# Patient Record
Sex: Female | Born: 1955
Health system: Southern US, Community
[De-identification: ages and names within clinical notes are randomized; demographics above are authoritative.]

## PROBLEM LIST (undated history)

## (undated) DIAGNOSIS — I255 Ischemic cardiomyopathy: Secondary | ICD-10-CM

## (undated) DIAGNOSIS — T8859XA Other complications of anesthesia, initial encounter: Secondary | ICD-10-CM

## (undated) DIAGNOSIS — I219 Acute myocardial infarction, unspecified: Secondary | ICD-10-CM

## (undated) DIAGNOSIS — E7800 Pure hypercholesterolemia, unspecified: Secondary | ICD-10-CM

## (undated) DIAGNOSIS — N952 Postmenopausal atrophic vaginitis: Secondary | ICD-10-CM

## (undated) DIAGNOSIS — I739 Peripheral vascular disease, unspecified: Secondary | ICD-10-CM

## (undated) DIAGNOSIS — N189 Chronic kidney disease, unspecified: Secondary | ICD-10-CM

## (undated) DIAGNOSIS — I10 Essential (primary) hypertension: Secondary | ICD-10-CM

## (undated) DIAGNOSIS — I639 Cerebral infarction, unspecified: Secondary | ICD-10-CM

## (undated) DIAGNOSIS — R55 Syncope and collapse: Secondary | ICD-10-CM

## (undated) DIAGNOSIS — F419 Anxiety disorder, unspecified: Secondary | ICD-10-CM

## (undated) DIAGNOSIS — G4700 Insomnia, unspecified: Secondary | ICD-10-CM

## (undated) DIAGNOSIS — I2510 Atherosclerotic heart disease of native coronary artery without angina pectoris: Secondary | ICD-10-CM

## (undated) DIAGNOSIS — R112 Nausea with vomiting, unspecified: Secondary | ICD-10-CM

## (undated) DIAGNOSIS — M1990 Unspecified osteoarthritis, unspecified site: Secondary | ICD-10-CM

## (undated) DIAGNOSIS — I779 Disorder of arteries and arterioles, unspecified: Secondary | ICD-10-CM

## (undated) DIAGNOSIS — F32A Depression, unspecified: Secondary | ICD-10-CM

## (undated) DIAGNOSIS — K219 Gastro-esophageal reflux disease without esophagitis: Secondary | ICD-10-CM

## (undated) DIAGNOSIS — T4145XA Adverse effect of unspecified anesthetic, initial encounter: Secondary | ICD-10-CM

## (undated) DIAGNOSIS — Z87440 Personal history of urinary (tract) infections: Secondary | ICD-10-CM

## (undated) DIAGNOSIS — F329 Major depressive disorder, single episode, unspecified: Secondary | ICD-10-CM

## (undated) DIAGNOSIS — G43909 Migraine, unspecified, not intractable, without status migrainosus: Secondary | ICD-10-CM

## (undated) DIAGNOSIS — R6882 Decreased libido: Secondary | ICD-10-CM

## (undated) DIAGNOSIS — Z9889 Other specified postprocedural states: Secondary | ICD-10-CM

## (undated) DIAGNOSIS — Z780 Asymptomatic menopausal state: Secondary | ICD-10-CM

## (undated) DIAGNOSIS — Z720 Tobacco use: Secondary | ICD-10-CM

## (undated) DIAGNOSIS — R1031 Right lower quadrant pain: Secondary | ICD-10-CM

## (undated) DIAGNOSIS — E785 Hyperlipidemia, unspecified: Secondary | ICD-10-CM

## (undated) HISTORY — DX: Depression, unspecified: F32.A

## (undated) HISTORY — DX: Right lower quadrant pain: R10.31

## (undated) HISTORY — DX: Cerebral infarction, unspecified: I63.9

## (undated) HISTORY — PX: TUBAL LIGATION: SHX77

## (undated) HISTORY — DX: Disorder of arteries and arterioles, unspecified: I77.9

## (undated) HISTORY — DX: Atherosclerotic heart disease of native coronary artery without angina pectoris: I25.10

## (undated) HISTORY — DX: Asymptomatic menopausal state: Z78.0

## (undated) HISTORY — DX: Anxiety disorder, unspecified: F41.9

## (undated) HISTORY — DX: Insomnia, unspecified: G47.00

## (undated) HISTORY — DX: Acute myocardial infarction, unspecified: I21.9

## (undated) HISTORY — DX: Hyperlipidemia, unspecified: E78.5

## (undated) HISTORY — DX: Decreased libido: R68.82

## (undated) HISTORY — DX: Ischemic cardiomyopathy: I25.5

## (undated) HISTORY — DX: Personal history of urinary (tract) infections: Z87.440

## (undated) HISTORY — DX: Syncope and collapse: R55

## (undated) HISTORY — PX: OOPHORECTOMY: SHX86

## (undated) HISTORY — PX: ENDOMETRIAL ABLATION: SHX621

## (undated) HISTORY — DX: Peripheral vascular disease, unspecified: I73.9

## (undated) HISTORY — DX: Major depressive disorder, single episode, unspecified: F32.9

## (undated) HISTORY — DX: Tobacco use: Z72.0

## (undated) HISTORY — DX: Postmenopausal atrophic vaginitis: N95.2

## (undated) HISTORY — DX: Migraine, unspecified, not intractable, without status migrainosus: G43.909

## (undated) SURGERY — ECHOCARDIOGRAM, TRANSESOPHAGEAL
Anesthesia: Moderate Sedation

---

## 2005-08-28 ENCOUNTER — Ambulatory Visit: Payer: Self-pay | Admitting: Vascular Surgery

## 2005-09-08 ENCOUNTER — Ambulatory Visit: Payer: Self-pay | Admitting: Vascular Surgery

## 2006-10-20 ENCOUNTER — Ambulatory Visit: Payer: Self-pay | Admitting: Orthopaedic Surgery

## 2006-11-27 HISTORY — PX: KNEE ARTHROSCOPY: SUR90

## 2006-12-28 ENCOUNTER — Ambulatory Visit: Payer: Self-pay | Admitting: Orthopaedic Surgery

## 2010-09-18 ENCOUNTER — Ambulatory Visit: Payer: Self-pay | Admitting: Unknown Physician Specialty

## 2010-11-09 LAB — HM COLONOSCOPY

## 2010-12-04 ENCOUNTER — Ambulatory Visit: Payer: Self-pay | Admitting: Unknown Physician Specialty

## 2010-12-10 ENCOUNTER — Ambulatory Visit: Payer: Self-pay | Admitting: Unknown Physician Specialty

## 2010-12-15 LAB — PATHOLOGY REPORT

## 2011-07-02 ENCOUNTER — Observation Stay: Payer: Self-pay | Admitting: Internal Medicine

## 2012-04-26 ENCOUNTER — Ambulatory Visit: Payer: Self-pay | Admitting: Internal Medicine

## 2013-03-15 ENCOUNTER — Inpatient Hospital Stay: Payer: Self-pay | Admitting: Internal Medicine

## 2013-03-15 LAB — BASIC METABOLIC PANEL
Anion Gap: 10 (ref 7–16)
Calcium, Total: 9.3 mg/dL (ref 8.5–10.1)
Chloride: 107 mmol/L (ref 98–107)
EGFR (African American): >60
Osmolality: 281 (ref 275–301)
Potassium: 3.6 mmol/L (ref 3.5–5.1)
Sodium: 142 mmol/L (ref 136–145)

## 2013-03-15 LAB — CK TOTAL AND CKMB (NOT AT ARMC)
CK, Total: 111 U/L (ref 21–215)
CK-MB: 1.2 ng/mL (ref 0.5–3.6)

## 2013-03-15 LAB — APTT: Activated PTT: 29.1 secs (ref 23.6–35.9)

## 2013-03-15 LAB — CBC
HCT: 36.2 % (ref 35.0–47.0)
HGB: 12.5 g/dL (ref 12.0–16.0)
MCH: 32.2 pg (ref 26.0–34.0)
MCHC: 34.4 g/dL (ref 32.0–36.0)
Platelet: 226 10*3/uL (ref 150–440)
WBC: 7 10*3/uL (ref 3.6–11.0)

## 2013-03-15 LAB — TROPONIN I: Troponin-I: 0.36 ng/mL — ABNORMAL HIGH

## 2013-03-16 HISTORY — PX: CARDIAC CATHETERIZATION: SHX172

## 2013-03-16 LAB — CBC WITH DIFFERENTIAL/PLATELET
Basophil #: 0 10*3/uL (ref 0.0–0.1)
Basophil %: 0.6 %
Eosinophil #: 0.3 10*3/uL (ref 0.0–0.7)
HCT: 34.3 % — ABNORMAL LOW (ref 35.0–47.0)
HGB: 11.8 g/dL — ABNORMAL LOW (ref 12.0–16.0)
Lymphocyte #: 2.1 10*3/uL (ref 1.0–3.6)
MCH: 31.9 pg (ref 26.0–34.0)
MCHC: 34.3 g/dL (ref 32.0–36.0)
Monocyte #: 0.8 x10 3/mm (ref 0.2–0.9)
Monocyte %: 11.2 %
Neutrophil %: 53.6 %
Platelet: 206 10*3/uL (ref 150–440)
RBC: 3.69 10*6/uL — ABNORMAL LOW (ref 3.80–5.20)

## 2013-03-16 LAB — BASIC METABOLIC PANEL
BUN: 13 mg/dL (ref 7–18)
Calcium, Total: 8.5 mg/dL (ref 8.5–10.1)
Co2: 28 mmol/L (ref 21–32)
Creatinine: 0.72 mg/dL (ref 0.60–1.30)
EGFR (Non-African Amer.): >60
Osmolality: 289 (ref 275–301)

## 2013-03-16 LAB — LIPID PANEL: Ldl Cholesterol, Calc: 134 mg/dL — ABNORMAL HIGH (ref 0–100)

## 2013-03-16 LAB — CK TOTAL AND CKMB (NOT AT ARMC)
CK-MB: 0.7 ng/mL (ref 0.5–3.6)
CK-MB: 0.7 ng/mL (ref 0.5–3.6)

## 2013-03-16 LAB — TROPONIN I: Troponin-I: 0.28 ng/mL — ABNORMAL HIGH

## 2013-03-16 LAB — PROTIME-INR: INR: 1

## 2013-03-17 LAB — CBC WITH DIFFERENTIAL/PLATELET
Eosinophil #: 0.2 10*3/uL (ref 0.0–0.7)
Eosinophil %: 2.8 %
HGB: 11.6 g/dL — ABNORMAL LOW (ref 12.0–16.0)
Lymphocyte #: 1.6 10*3/uL (ref 1.0–3.6)
Lymphocyte %: 24.2 %
MCH: 32.2 pg (ref 26.0–34.0)
Monocyte #: 0.7 x10 3/mm (ref 0.2–0.9)
Monocyte %: 10.7 %
Neutrophil #: 4.1 10*3/uL (ref 1.4–6.5)
Neutrophil %: 61.8 %
Platelet: 184 10*3/uL (ref 150–440)
RDW: 12.5 % (ref 11.5–14.5)

## 2013-03-17 LAB — BASIC METABOLIC PANEL
Anion Gap: 6 — ABNORMAL LOW (ref 7–16)
Creatinine: 0.67 mg/dL (ref 0.60–1.30)
EGFR (African American): >60
EGFR (Non-African Amer.): >60
Glucose: 81 mg/dL (ref 65–99)
Osmolality: 285 (ref 275–301)
Potassium: 3.7 mmol/L (ref 3.5–5.1)
Sodium: 144 mmol/L (ref 136–145)

## 2013-03-17 LAB — TROPONIN I: Troponin-I: 0.11 ng/mL — ABNORMAL HIGH

## 2013-03-17 LAB — CK TOTAL AND CKMB (NOT AT ARMC): CK-MB: 0.6 ng/mL (ref 0.5–3.6)

## 2013-03-31 ENCOUNTER — Encounter: Payer: Self-pay | Admitting: Cardiology

## 2013-04-04 ENCOUNTER — Emergency Department: Payer: Self-pay | Admitting: Internal Medicine

## 2013-04-04 LAB — CBC
HCT: 41.4 % (ref 35.0–47.0)
HGB: 14.3 g/dL (ref 12.0–16.0)
MCH: 32 pg (ref 26.0–34.0)
Platelet: 204 10*3/uL (ref 150–440)
RDW: 12.2 % (ref 11.5–14.5)
WBC: 7.1 10*3/uL (ref 3.6–11.0)

## 2013-04-04 LAB — BASIC METABOLIC PANEL
Calcium, Total: 9.8 mg/dL (ref 8.5–10.1)
Co2: 28 mmol/L (ref 21–32)
Creatinine: 1.05 mg/dL (ref 0.60–1.30)
EGFR (African American): >60
Sodium: 136 mmol/L (ref 136–145)

## 2013-04-04 LAB — PROTIME-INR: Prothrombin Time: 13.3 secs (ref 11.5–14.7)

## 2013-04-04 LAB — TROPONIN I
Troponin-I: <0.02 ng/mL
Troponin-I: <0.02 ng/mL

## 2013-04-09 ENCOUNTER — Encounter: Payer: Self-pay | Admitting: Cardiology

## 2013-05-09 ENCOUNTER — Encounter: Payer: Self-pay | Admitting: Cardiology

## 2013-06-09 ENCOUNTER — Encounter: Payer: Self-pay | Admitting: Cardiology

## 2013-08-10 ENCOUNTER — Inpatient Hospital Stay (HOSPITAL_COMMUNITY)
Admission: EM | Admit: 2013-08-10 | Discharge: 2013-08-11 | DRG: 533 | Disposition: A | Discharge status: Home / Self Care | Payer: BC Managed Care – PPO | Attending: Family Medicine | Admitting: Family Medicine

## 2013-08-10 ENCOUNTER — Emergency Department (HOSPITAL_COMMUNITY): Payer: BC Managed Care – PPO

## 2013-08-10 ENCOUNTER — Ambulatory Visit (INDEPENDENT_AMBULATORY_CARE_PROVIDER_SITE_OTHER): Payer: BC Managed Care – PPO | Admitting: Family Medicine

## 2013-08-10 ENCOUNTER — Encounter (HOSPITAL_COMMUNITY): Payer: Self-pay | Admitting: Neurology

## 2013-08-10 VITALS — BP 130/84 | HR 57 | Temp 98.5°F | Resp 16 | Ht 62.0 in | Wt 135.0 lb

## 2013-08-10 DIAGNOSIS — I509 Heart failure, unspecified: Secondary | ICD-10-CM | POA: Diagnosis present

## 2013-08-10 DIAGNOSIS — Z87891 Personal history of nicotine dependence: Secondary | ICD-10-CM

## 2013-08-10 DIAGNOSIS — R29898 Other symptoms and signs involving the musculoskeletal system: Secondary | ICD-10-CM | POA: Diagnosis present

## 2013-08-10 DIAGNOSIS — I252 Old myocardial infarction: Secondary | ICD-10-CM

## 2013-08-10 DIAGNOSIS — E782 Mixed hyperlipidemia: Secondary | ICD-10-CM | POA: Diagnosis present

## 2013-08-10 DIAGNOSIS — E876 Hypokalemia: Secondary | ICD-10-CM | POA: Diagnosis present

## 2013-08-10 DIAGNOSIS — R531 Weakness: Secondary | ICD-10-CM | POA: Diagnosis present

## 2013-08-10 DIAGNOSIS — I6340 Cerebral infarction due to embolism of unspecified cerebral artery: Principal | ICD-10-CM | POA: Diagnosis present

## 2013-08-10 DIAGNOSIS — I10 Essential (primary) hypertension: Secondary | ICD-10-CM | POA: Diagnosis present

## 2013-08-10 DIAGNOSIS — I639 Cerebral infarction, unspecified: Secondary | ICD-10-CM | POA: Diagnosis present

## 2013-08-10 DIAGNOSIS — R269 Unspecified abnormalities of gait and mobility: Secondary | ICD-10-CM | POA: Diagnosis present

## 2013-08-10 DIAGNOSIS — E7800 Pure hypercholesterolemia, unspecified: Secondary | ICD-10-CM | POA: Diagnosis present

## 2013-08-10 DIAGNOSIS — I5020 Unspecified systolic (congestive) heart failure: Secondary | ICD-10-CM | POA: Diagnosis present

## 2013-08-10 DIAGNOSIS — R42 Dizziness and giddiness: Secondary | ICD-10-CM

## 2013-08-10 DIAGNOSIS — H5789 Other specified disorders of eye and adnexa: Secondary | ICD-10-CM

## 2013-08-10 DIAGNOSIS — R002 Palpitations: Secondary | ICD-10-CM

## 2013-08-10 DIAGNOSIS — R070 Pain in throat: Secondary | ICD-10-CM

## 2013-08-10 DIAGNOSIS — Z823 Family history of stroke: Secondary | ICD-10-CM

## 2013-08-10 DIAGNOSIS — R0602 Shortness of breath: Secondary | ICD-10-CM

## 2013-08-10 DIAGNOSIS — E785 Hyperlipidemia, unspecified: Secondary | ICD-10-CM

## 2013-08-10 HISTORY — DX: Essential (primary) hypertension: I10

## 2013-08-10 HISTORY — DX: Pure hypercholesterolemia, unspecified: E78.00

## 2013-08-10 LAB — COMPREHENSIVE METABOLIC PANEL
BUN: 21 mg/dL (ref 6–23)
CO2: 24 mEq/L (ref 19–32)
Calcium: 9.3 mg/dL (ref 8.4–10.5)
Chloride: 97 mEq/L (ref 96–112)
Creatinine, Ser: 0.96 mg/dL (ref 0.50–1.10)
GFR calc Af Amer: 75 mL/min — ABNORMAL LOW (ref >90)
GFR calc non Af Amer: 65 mL/min — ABNORMAL LOW (ref >90)
Glucose, Bld: 82 mg/dL (ref 70–99)
Sodium: 135 mEq/L (ref 135–145)
Total Bilirubin: 0.3 mg/dL (ref 0.3–1.2)

## 2013-08-10 LAB — URINALYSIS, ROUTINE W REFLEX MICROSCOPIC
Bilirubin Urine: NEGATIVE
Glucose, UA: NEGATIVE mg/dL
Hgb urine dipstick: NEGATIVE
Nitrite: NEGATIVE
Specific Gravity, Urine: 1.013 (ref 1.005–1.030)
Urobilinogen, UA: 0.2 mg/dL (ref 0.0–1.0)
pH: 6 (ref 5.0–8.0)

## 2013-08-10 LAB — RAPID URINE DRUG SCREEN, HOSP PERFORMED
Barbiturates: NOT DETECTED
Cocaine: NOT DETECTED
Opiates: NOT DETECTED
Tetrahydrocannabinol: NOT DETECTED

## 2013-08-10 LAB — POCT I-STAT, CHEM 8
BUN: 22 mg/dL (ref 6–23)
Creatinine, Ser: 1.1 mg/dL (ref 0.50–1.10)
Glucose, Bld: 88 mg/dL (ref 70–99)
Potassium: 2.8 mEq/L — ABNORMAL LOW (ref 3.5–5.1)
Sodium: 139 mEq/L (ref 135–145)
TCO2: 27 mmol/L (ref 0–100)

## 2013-08-10 LAB — GLUCOSE, CAPILLARY: Glucose-Capillary: 89 mg/dL (ref 70–99)

## 2013-08-10 LAB — TROPONIN I
Troponin I: <0.3 ng/mL (ref <0.30)
Troponin I: <0.3 ng/mL (ref <0.30)
Troponin I: <0.3 ng/mL (ref <0.30)

## 2013-08-10 LAB — DIFFERENTIAL
Eosinophils Relative: 0 % (ref 0–5)
Lymphocytes Relative: 20 % (ref 12–46)
Lymphs Abs: 2 10*3/uL (ref 0.7–4.0)
Monocytes Absolute: 1.1 10*3/uL — ABNORMAL HIGH (ref 0.1–1.0)
Monocytes Relative: 11 % (ref 3–12)
Neutro Abs: 7.2 10*3/uL (ref 1.7–7.7)
Neutrophils Relative %: 69 % (ref 43–77)

## 2013-08-10 LAB — CBC
HCT: 33.9 % — ABNORMAL LOW (ref 36.0–46.0)
Hemoglobin: 11.9 g/dL — ABNORMAL LOW (ref 12.0–15.0)
MCV: 92.9 fL (ref 78.0–100.0)
Platelets: 249 10*3/uL (ref 150–400)
RBC: 3.65 MIL/uL — ABNORMAL LOW (ref 3.87–5.11)
RDW: 12.7 % (ref 11.5–15.5)
WBC: 10.4 10*3/uL (ref 4.0–10.5)

## 2013-08-10 LAB — POCT I-STAT TROPONIN I: Troponin i, poc: 0 ng/mL (ref 0.00–0.08)

## 2013-08-10 LAB — ETHANOL: Alcohol, Ethyl (B): <11 mg/dL (ref 0–11)

## 2013-08-10 MED ORDER — ASPIRIN EC 325 MG PO TBEC
325.0000 mg | DELAYED_RELEASE_TABLET | Freq: Every day | ORAL | Status: DC
  Administered 2013-08-11: 325 mg via ORAL
  Filled 2013-08-10: qty 1

## 2013-08-10 MED ORDER — POTASSIUM CHLORIDE 10 MEQ/100ML IV SOLN
10.0000 meq | INTRAVENOUS | Status: AC
  Administered 2013-08-10 (×2): 10 meq via INTRAVENOUS
  Filled 2013-08-10 (×4): qty 100

## 2013-08-10 MED ORDER — ZOLPIDEM TARTRATE 5 MG PO TABS: 5.0000 mg | ORAL_TABLET | Freq: Every evening | ORAL | Status: DC | PRN

## 2013-08-10 MED ORDER — POTASSIUM CHLORIDE CRYS ER 20 MEQ PO TBCR
40.0000 meq | EXTENDED_RELEASE_TABLET | Freq: Two times a day (BID) | ORAL | Status: DC
  Administered 2013-08-10: 40 meq via ORAL
  Filled 2013-08-10: qty 2

## 2013-08-10 MED ORDER — INFLUENZA VAC SPLIT QUAD 0.5 ML IM SUSP
0.5000 mL | INTRAMUSCULAR | Status: AC
  Administered 2013-08-11: 0.5 mL via INTRAMUSCULAR
  Filled 2013-08-10 (×2): qty 0.5

## 2013-08-10 MED ORDER — ATORVASTATIN CALCIUM 40 MG PO TABS
40.0000 mg | ORAL_TABLET | Freq: Every day | ORAL | Status: DC
  Administered 2013-08-10 – 2013-08-11 (×2): 40 mg via ORAL
  Filled 2013-08-10 (×3): qty 1

## 2013-08-10 MED ORDER — PANTOPRAZOLE SODIUM 40 MG PO TBEC
40.0000 mg | DELAYED_RELEASE_TABLET | Freq: Every day | ORAL | Status: DC
  Administered 2013-08-10 – 2013-08-11 (×2): 40 mg via ORAL
  Filled 2013-08-10 (×2): qty 1

## 2013-08-10 MED ORDER — STUDY - INVESTIGATIONAL DRUG SIMPLE RECORD
600.0000 mg | Status: AC
  Administered 2013-08-10: 600 mg via ORAL
  Filled 2013-08-10: qty 600

## 2013-08-10 MED ORDER — FLUOXETINE HCL 20 MG PO CAPS
40.0000 mg | ORAL_CAPSULE | Freq: Every day | ORAL | Status: DC
  Administered 2013-08-11: 40 mg via ORAL
  Filled 2013-08-10: qty 2

## 2013-08-10 MED ORDER — STUDY - INVESTIGATIONAL DRUG SIMPLE RECORD
75.0000 mg | Freq: Every day | Status: DC
  Administered 2013-08-11: 75 mg via ORAL
  Filled 2013-08-10 (×2): qty 75

## 2013-08-10 MED ORDER — SODIUM CHLORIDE 0.9 % IJ SOLN
3.0000 mL | Freq: Two times a day (BID) | INTRAMUSCULAR | Status: DC
  Administered 2013-08-11: 3 mL via INTRAVENOUS

## 2013-08-10 MED ORDER — PNEUMOCOCCAL VAC POLYVALENT 25 MCG/0.5ML IJ INJ
0.5000 mL | INJECTION | INTRAMUSCULAR | Status: AC
  Administered 2013-08-11: 0.5 mL via INTRAMUSCULAR
  Filled 2013-08-10: qty 0.5

## 2013-08-10 MED ORDER — NITROGLYCERIN 0.4 MG SL SUBL: 0.4000 mg | SUBLINGUAL_TABLET | SUBLINGUAL | Status: DC | PRN

## 2013-08-10 MED ORDER — FLUOXETINE HCL 40 MG PO CAPS: 40.0000 mg | ORAL_CAPSULE | Freq: Every day | ORAL | Status: DC

## 2013-08-10 MED ORDER — NITROFURANTOIN MACROCRYSTAL 50 MG PO CAPS
50.0000 mg | ORAL_CAPSULE | Freq: Every day | ORAL | Status: DC
  Administered 2013-08-10 – 2013-08-11 (×2): 50 mg via ORAL
  Filled 2013-08-10 (×2): qty 1

## 2013-08-10 MED ORDER — SODIUM CHLORIDE 0.9 % IV SOLN: INTRAVENOUS | Status: DC

## 2013-08-10 NOTE — Progress Notes (Addendum)
Subjective: 57 year old lady who comes in here today with history of having developed dizziness after getting up this morning. She was driving to work and felt like she was weaving back and forth. She went into work at [employer] and was ill-appearing so they brought her over here. The dizziness is a little better right now that she had to have some assistance when she was walking in she says. She's had these kind of symptoms once before, if she had her heart attack. Last night she had some palpitations. She apparently was more short of breath yesterday and went to see her regular doctor he said he wanted an echocardiogram on a regular schedule since the computer was down yesterday. Back in the spring she had a heart attacks. She said it was attributed to her smoking. She apparently no longer smokes. She walks regularly. She did an not have to have any angioplasties, though she did have a heart catheterization which showed complete blockage of one vessel or from blockage of another. She's not having chest pain or palpitations this morning. Says she is on the Lasix because I thought she had some fluid on her lungs.  She has a history of chronic UTIs and is on Macrodantin.  Objective: Plan, unemotional lady. Her TMs are normal. Eyes PERRLA. EOMs intact. Fundi benign with discs sharp. Throat clear. Neck supple without nodes or thyromegaly. No carotid bruits. Chest is clear to auscultation. Heart regular without murmurs or arrhythmias. She may have had an intermittent S4. Her abdomen was soft without mass or tenderness. No ankle edema.  Assessment: Dizziness History of coronary artery disease and myocardial infarction Hypertension History of chronic or recurrent UTIs    Plan: EKG CBC and BMET  EKG shows inversion of T waves precordial leads.  No old EKG for comparison.    With the fairly recent heart attack that was followed by the same kind of dizziness symptoms and her having EKG  abnormalities I feel she is okay to go to the emergency room for additional evaluation. We will get EMS, began IV and oxygen. She is already had aspirin this morning.  I spoke with her boyfriend on the telephone and her coworker who had accompanied her here.   I received a copy of her medicine list from the pharmacy. It looks like a recent ones prescribed include zolpidem 5 mg, triamterene hydrochlorothiazide 37.5/25, pantoprazole 40, Nitrostat, metoprolol succinate 25 mg, isosorbide 60 mg, furosemide 20 mg, fluoxetine 40 mg, he total lack 500 mg, azithromycin 250 mg dose pack.

## 2013-08-10 NOTE — ED Notes (Signed)
LSN at 0540. Pt reports that she was driving to work and she started drifting to the left side and noticed it was difficult to pull the steering wheel to the right.

## 2013-08-10 NOTE — ED Notes (Signed)
Stoke team at bedside, along with patient's family.

## 2013-08-10 NOTE — H&P (Signed)
Family Medicine Teaching [facility] Admission History and Physical Service Pager: [phone]  Patient name: [name] Medical record number: [id number] Date of birth: 01/23/1956 Age: 57 y.o. Gender: female  Primary Care Provider: Clydie Braun, MD Consultants: Stroke/neuro Code Status: Full code  Chief Complaint: Dizziness  Assessment and Plan: [name] is a 57 y.o. female presenting with dizziness and gait disturbance since this morning . PMH is significant for MI in May as well as HTN, HLD, migraines with aura and CHF (systolic EF 35%).   # CVA: MRI w/ R internal capsule infarct, outside TPA window. PT considering signing on to clinical POINT trial. Deficits on admission of R slight R facial droop, and R limb weakness. NEuro following and appreciate recommendations - Start ASA 325 - Echo and carotid dopplers ordered - increase home atorvastatin 10mg  to 40mg  - PT/OT/speech - risk stratification labs ordered - home HTN meds, held to allow permissive HTN - Point trial (clopidegral or placebo)  # CV: CHF systolic type w/ EF 35%.   reporting SOB and feeling similar to prior MI. No current MI after reviewing previous EKG as Trop neg as well.  - trop neg x1, will cycle - called [name] to fax PCP records including old EKG and Echo - repeat echo today - increased lipitor as above - holding home isordil, dyazide, lasix, nitro in post-stroke phase - rpt EKG in am  # Hypokalemia: Likely secondary to Lasix. K 2.8 in ED, EKG with long QTc (589) - IV runs x4 today as patient NPO until swallow - recheck bmet and ekg in am - monitor on tele  FEN/GI: NPO until swallow eval, MIVF until eating, home PPI Prophylaxis: SCDs, in trial so no lovenox  Disposition: admit to telemetry pending clinical improvement and further work-up  History of Present Illness: [name] is a 57 y.o. female presenting with dizziness and gait disturbance since this morning. Pt was in her normal state  of health until this morning when she was driving to work and started feeling dizzy and swerving to the left. She crossed the median but somehow made it to work safely where her coworkers noticed that she was walking "like she was drunk" and swerving to the left so they took her to urgent care. She reports feeling dizzy and confused but did not notice any weakness or vision changes. She also noticed that she could not open her right eye all the way and had tingling in both arms. In addition she reports shortness of breath without chest pain similar to when she had an MI. She had a severe headache last week that felt like her normal migraines.   She reports that given her history and similar presentation the urgent care this morning thought she was having another MI so they sent her to the ED.  In the ED a code stroke was called and she had an MRI that showed right internal capsule infarct and an EKG with T-wave inversion and long QT  Review Of Systems: Per HPI with the following additions: Denies HA this am. Otherwise 12 point review of systems was performed and was unremarkable.  Patient Active Problem List   Diagnosis Date Noted  . Right sided weakness 08/10/2013  . Hypertension 08/10/2013  . Hyperlipidemia 08/10/2013  . History of MI (myocardial infarction) 08/10/2013  . Stroke, acute, embolic 08/10/2013   Past Medical History: Past Medical History  Diagnosis Date  . Hypertension   . Hypercholesteremia    Past Surgical History: Past  Surgical History  Procedure Laterality Date  . Oophorectomy     Social History: History  Substance Use Topics  . Smoking status: Former Smoker    Quit date: 04/07/2013  . Smokeless tobacco: Not on file  . Alcohol Use: No   Please also refer to relevant sections of EMR.  Family History: Family History  Problem Relation Age of Onset  . Hypertension Mother   . Hypertension Father   Mom - stroke Sister - stroke  Allergies and  Medications: Allergies  Allergen Reactions  . Penicillins Nausea And Vomiting   No current facility-administered medications on file prior to encounter.   Current Outpatient Prescriptions on File Prior to Encounter  Medication Sig Dispense Refill  . ALPRAZolam (XANAX) 1 MG tablet Take 1 mg by mouth at bedtime as needed for sleep.      Marland Kitchen aspirin 81 MG tablet Take 81 mg by mouth daily.      Marland Kitchen FLUoxetine (PROZAC) 40 MG capsule Take 40 mg by mouth daily.      . furosemide (LASIX) 40 MG tablet Take 40 mg by mouth daily.       . isosorbide dinitrate (ISORDIL) 40 MG tablet Take 40 mg by mouth 3 (three) times daily.      Marland Kitchen zolpidem (AMBIEN) 5 MG tablet Take 5 mg by mouth at bedtime as needed for sleep.       Isordil qday Nitro PPI K - started yesterday  Objective: BP 119/60  Pulse 62  Temp(Src) 98 F (36.7 C) (Oral)  Resp 19  SpO2 96% Exam: General: NAD, Sitting in bed HEENT: MMM, PERRL, EOMI Neck: Supple, no LAD Cardiovascular: RRR, no murmur, no JVD Respiratory: basilar crackles on the right, otherwise CTAB, normal WOB Abdomen: soft, nontender, nondistended Extremities: WWP, no LE edema Skin: no rashes Neuro: strength 3/5 R and 5/5 left upper and lower extremities, right facial droop, tongue deviates left otherwise CNs intact, mild gait instability, negative rhomberg Psych: flat affect (family reports this is her norm), reports mood is "fine"  Labs and Imaging: CBC BMET   Recent Labs Lab 08/10/13 1020 08/10/13 1025  WBC 10.4  --   HGB 11.9* 11.9*  HCT 33.9* 35.0*  PLT 249  --     Recent Labs Lab 08/10/13 1020 08/10/13 1025  NA 135 139  K 2.8* 2.8*  CL 97 100  CO2 24  --   BUN 21 22  CREATININE 0.96 1.10  GLUCOSE 82 88  CALCIUM 9.3  --     Trop neg x1  MRI brain: Small foci of acute infarct involving the posterior limb internal  capsule on the right and posterior insula on the right. Chronic microvascular ischemia, Mild to moderate in degree  EKG: NSR,  inverted T-waves in V2 and V3, QT prolonged (589)   [name] Low, MD 08/10/2013, 3:20 PM PGY-1, [facility] Family Medicine FPTS Intern pager: [phone], text pages welcome   ---------------------------------------------------------------------------------------------------- UPPER LEVEL ADDENDUM  Pt seen and examined. Agree w/ Dr. Cherre Huger H&P w/ changes noted in [facility]   Shelly Flatten, MD Family Medicine PGY-3 08/10/2013, 11:05 PM

## 2013-08-10 NOTE — ED Notes (Signed)
MD at bedside.- [name]

## 2013-08-10 NOTE — ED Provider Notes (Signed)
CSN: [id number]     Arrival date & time 08/10/13  0935 History   First MD Initiated Contact with Patient 08/10/13 [phone]     Chief Complaint  Patient presents with  . Dizziness   (Consider location/radiation/quality/duration/timing/severity/associated sxs/prior Treatment) HPI Comments: 57 y/o female with history of MI, HLD, HTN presenting with dizziness. Symptoms onset at 6 am while she was driving to work. Reports sensation of the world being sideways and was noted to be walking different by coworkers. Reports SOB at baseline since MI in May, no chest pain. Denies vision changes, difficulty swallowing, n/v, numbness, weakness.  The history is provided by the patient. No language interpreter was used.    Past Medical History  Diagnosis Date  . Hypertension   . Hypercholesteremia    Past Surgical History  Procedure Laterality Date  . Oophorectomy     No family history on file. History  Substance Use Topics  . Smoking status: Former Smoker    Quit date: 04/07/2013  . Smokeless tobacco: Not on file  . Alcohol Use: No   OB History   Grav Para Term Preterm Abortions TAB SAB Ect Mult Living                 Review of Systems  Constitutional: Negative for fever and diaphoresis.  HENT: Negative for hearing loss.   Eyes: Negative for visual disturbance.  Respiratory: Positive for shortness of breath. Negative for cough.   Cardiovascular: Negative for chest pain and palpitations.  Gastrointestinal: Negative for nausea and vomiting.  Neurological: Positive for dizziness. Negative for syncope, facial asymmetry, speech difficulty, weakness, numbness and headaches.  All other systems reviewed and are negative.    Allergies  Penicillins  Home Medications   Current Outpatient Rx  Name  Route  Sig  Dispense  Refill  . ALPRAZolam (XANAX) 1 MG tablet   Oral   Take 1 mg by mouth at bedtime as needed for sleep.         Marland Kitchen aspirin 81 MG tablet   Oral   Take 81 mg by mouth daily.         Marland Kitchen azithromycin (ZITHROMAX) 500 MG tablet   Oral   Take 500 mg by mouth daily.         Marland Kitchen FLUoxetine (PROZAC) 40 MG capsule   Oral   Take 40 mg by mouth daily.         . furosemide (LASIX) 40 MG tablet   Oral   Take 40 mg by mouth.         . isosorbide dinitrate (ISORDIL) 40 MG tablet   Oral   Take 40 mg by mouth 3 (three) times daily.         Marland Kitchen lisinopril-hydrochlorothiazide (PRINZIDE,ZESTORETIC) 20-12.5 MG per tablet   Oral   Take 1 tablet by mouth daily.         . nitrofurantoin (MACRODANTIN) 50 MG capsule   Oral   Take 50 mg by mouth 4 (four) times daily.         Marland Kitchen zolpidem (AMBIEN) 5 MG tablet   Oral   Take 5 mg by mouth at bedtime as needed for sleep.          BP 144/79  Pulse 57  Temp(Src) 98 F (36.7 C) (Oral)  Resp 18  SpO2 99% Physical Exam  Vitals reviewed. Constitutional: She is oriented to person, place, and time. She appears well-developed and well-nourished. No distress.  HENT:  Head: Atraumatic.  Eyes: EOM are normal. Pupils are equal, round, and reactive to light.  Visual fields intact  Neck: Neck supple.  Cardiovascular: Normal rate, regular rhythm, normal heart sounds and intact distal pulses.   Pulmonary/Chest: Effort normal and breath sounds normal.  Abdominal: Soft. Bowel sounds are normal. She exhibits no distension. There is no tenderness.  Neurological: She is alert and oriented to person, place, and time. A sensory deficit (right face and right arm) is present. No cranial nerve deficit. Coordination (right arm ataxia) and gait abnormal. GCS eye subscore is 4. GCS verbal subscore is 5. GCS motor subscore is 6.  Reflex Scores:      Tricep reflexes are 2+ on the right side and 2+ on the left side.      Bicep reflexes are 2+ on the right side and 2+ on the left side.      Brachioradialis reflexes are 2+ on the right side and 2+ on the left side.      Patellar reflexes are 2+ on the right side and 2+ on the left side. 4/5  right UE 5/5 left UE 5/5 left LE 4/5 right LE    ED Course  Procedures (including critical care time) Labs Review Labs Reviewed  CBC - Abnormal; Notable for the following:    RBC 3.65 (*)    Hemoglobin 11.9 (*)    HCT 33.9 (*)    All other components within normal limits  DIFFERENTIAL - Abnormal; Notable for the following:    Monocytes Absolute 1.1 (*)    All other components within normal limits  COMPREHENSIVE METABOLIC PANEL - Abnormal; Notable for the following:    Potassium 2.8 (*)    GFR calc non Af Amer 65 (*)    GFR calc Af Amer 75 (*)    All other components within normal limits  POCT I-STAT, CHEM 8 - Abnormal; Notable for the following:    Potassium 2.8 (*)    Hemoglobin 11.9 (*)    HCT 35.0 (*)    All other components within normal limits  ETHANOL  PROTIME-INR  APTT  TROPONIN I  URINE RAPID DRUG SCREEN (HOSP PERFORMED)  URINALYSIS, ROUTINE W REFLEX MICROSCOPIC  GLUCOSE, CAPILLARY  POCT I-STAT TROPONIN I   Imaging Review Ct Head Wo Contrast  08/10/2013   CLINICAL DATA:  Dizziness  EXAM: CT HEAD WITHOUT CONTRAST  TECHNIQUE: Contiguous axial images were obtained from the base of the skull through the vertex without intravenous contrast.  COMPARISON:  None.  FINDINGS: The ventricles are normal size and configuration. No significant extra-axial fluid collection is identified. No findings to suggest acute hemorrhage, acute infarction or space-occupying mass lesion are noted.  IMPRESSION: No acute abnormality is noted.  These results were called by telephone at the time of interpretation on 08/10/2013 at 10:22 AM to Dr. Amada Jupiter, who verbally acknowledged these results.   Electronically Signed   By: Alcide Clever   On: 08/10/2013 10:26   Mr Brain Wo Contrast  08/10/2013   CLINICAL DATA:  Stroke. Right arm and leg weakness. Dizziness  EXAM: MRI HEAD WITHOUT CONTRAST  TECHNIQUE: Multiplanar, multisequence MR imaging was performed. No intravenous contrast was administered.   COMPARISON:  CT head 08/10/2013  FINDINGS: Small areas of acute infarct in the posterior limb internal capsule on the right and in the adjacent posterior insular cortex.  Mild chronic microvascular ischemic change throughout the white matter. Mild chronic microvascular ischemia in the pons. Small focus of chronic hemorrhage left frontal lobe likely  related to prior infarction.  Negative for mass or edema. No midline shift. Ventricle size is normal.  Pituitary is normal in size. Paranasal sinuses show mild mucosal edema. Vessels at the base of the brain are patent.  IMPRESSION: Small foci of acute infarct involving the posterior limb internal capsule on the right and posterior insula on the right.  Chronic microvascular ischemia, Mild to moderate in degree.   Electronically Signed   By: Marlan Palau M.D.   On: 08/10/2013 13:16   Date: 08/10/2013  Rate: 53  Rhythm: sinus rhythm  QRS Axis: normal  Intervals: QT prolonged  ST/T Wave abnormalities: nonspecific ST changes  Conduction Disutrbances:none  Narrative Interpretation: sinus rhythm, T wave inversion in V4-V6  Old EKG Reviewed: none available    MDM   1. Right sided weakness   2. History of MI (myocardial infarction)   3. Hypertension   4. Stroke, acute, embolic     57 y/o female with sudden onset dizziness. Last normal 6 am. Right sided weakness and ataxic gait. Code stroke called at 10:07. MRI showed small acute infarct in the right posterior limb of the internal capsule. Labs remarkable for hypokalemia. Neurology consulted for admission. Patient stable for transfer.   Labs and imaging reviewed in my medical decision making. Patient discussed with my attending, Dr. Rubin Payor.    Abagail Kitchens, MD 08/10/13 [phone]

## 2013-08-10 NOTE — ED Notes (Signed)
Per EMS- Pt comes from [facility]. THis morning at work coworkers reported she was walking around like she was drunk. Pt reports dizziness and that she can't breathe. 100% 2 L. Denies any CP. Lung sounds clear. Pt has hx of MI in May, reports this is similar but without any CP. SB HR 55, takes a BB. EKG showing t-wave inversion in V4-V6. 20 gauge to right wrist. BP 136/76. CBG 75.

## 2013-08-10 NOTE — Code Documentation (Addendum)
57yo female who woke up normal this morning.  Developed dizziness at 0540, while driving to work noticed right sided weakness.  EMS was called by her colleagues at work.  Patient brought to [facility] via [facility] arriving at 0935 .  H/o MI in May 2014 that also had associated dizziness.  EDP exam at 0950. Code stroke called at 0955 in ED.  Stroke team arrived at 21, neurologist arrived at 17, and patient arrived in CT at 53.  NIHSS on arrival 2 for right sensory deficit and right leg drift.  Patient reports decreased sensation in right face, arm and leg.  Right upper extremity has resolved per patient and has 5/5 strength on assessment.  On reassessment patient no longer reports decreased sensation in her face.  Patient is outside the window for treatment with tPA.  Bedside handoff completed with ED RN Maralyn Sago.  Research RN made aware of patient, will assess for potential enrollment in research trial.

## 2013-08-10 NOTE — ED Notes (Signed)
Pt still has dizziness, minimal right sided weakness, ataxia. EDP at bedside to evaluate patient for Code Stroke. Pt reports dizziness developed at 0600 this morning while driving. Woke up feeling normal.

## 2013-08-10 NOTE — ED Notes (Signed)
Pt may eat per Dr Rubin Payor. Pt provided meal.

## 2013-08-10 NOTE — ED Notes (Signed)
[name], boyfriend, [phone].

## 2013-08-10 NOTE — ED Notes (Signed)
Results of chem 8 shown to Dr. Rubin Payor

## 2013-08-10 NOTE — Consult Note (Signed)
Referring Physician: Rubin Payor    Chief Complaint: TIA/Stroke (code stroke out of window)  HPI:                                                                                                                                         [name] is an 57 y.o. female who last seen normal at 51. She woke up at 0430 took a shower and then drove to work. While driving to work at 1610 she noted her car "pulling to the left and she was weak on her right."  When she arrived at work she felt "dizzy" and was sent to urgent care.  While in urgent care they noted ECG changes and transferred her to [facility].  While in [place] she mentioned her right arm and leg had decreased sensation and strength. intitially she said her right face had decreased sensation but then stated it was normal.  She continued to have subjective decreased sensation on her right arm and leg.   Date last known well: Date: 08/10/2013 Time last known well: Time: 05:40 tPA Given: No: Out of window NIHSS: 2  Past Medical History  Diagnosis Date  . Hypertension   . Hypercholesteremia     Past Surgical History  Procedure Laterality Date  . Oophorectomy      Family History  Problem Relation Age of Onset  . Hypertension Mother   . Hypertension Father    Social History:  reports that she quit smoking about 4 months ago. She does not have any smokeless tobacco history on file. She reports that she does not drink alcohol or use illicit drugs.  Allergies:  Allergies  Allergen Reactions  . Penicillins Nausea And Vomiting    Medications:                                                                                                                           No current facility-administered medications for this encounter.   Current Outpatient Prescriptions  Medication Sig Dispense Refill  . ALPRAZolam (XANAX) 1 MG tablet Take 1 mg by mouth at bedtime as needed for sleep.      Marland Kitchen aspirin 81 MG tablet Take 81 mg by mouth daily.      Marland Kitchen  azithromycin (ZITHROMAX) 500 MG tablet Take 500 mg by mouth daily.      Marland Kitchen FLUoxetine (PROZAC) 40 MG  capsule Take 40 mg by mouth daily.      . furosemide (LASIX) 40 MG tablet Take 40 mg by mouth.      . isosorbide dinitrate (ISORDIL) 40 MG tablet Take 40 mg by mouth 3 (three) times daily.      Marland Kitchen lisinopril-hydrochlorothiazide (PRINZIDE,ZESTORETIC) 20-12.5 MG per tablet Take 1 tablet by mouth daily.      . nitrofurantoin (MACRODANTIN) 50 MG capsule Take 50 mg by mouth 4 (four) times daily.      Marland Kitchen zolpidem (AMBIEN) 5 MG tablet Take 5 mg by mouth at bedtime as needed for sleep.         ROS:                                                                                                                                       History obtained from the patient  General ROS: negative for - chills, fatigue, fever, night sweats, weight gain or weight loss Psychological ROS: negative for - behavioral disorder, hallucinations, memory difficulties, mood swings or suicidal ideation Ophthalmic ROS: negative for - blurry vision, double vision, eye pain or loss of vision ENT ROS: negative for - epistaxis, nasal discharge, oral lesions, sore throat, tinnitus or vertigo Allergy and Immunology ROS: negative for - hives or itchy/watery eyes Hematological and Lymphatic ROS: negative for - bleeding problems, bruising or swollen lymph nodes Endocrine ROS: negative for - galactorrhea, hair pattern changes, polydipsia/polyuria or temperature intolerance Respiratory ROS: negative for - cough, hemoptysis, shortness of breath or wheezing Cardiovascular ROS: negative for - chest pain, dyspnea on exertion, edema or irregular heartbeat Gastrointestinal ROS: negative for - abdominal pain, diarrhea, hematemesis, nausea/vomiting or stool incontinence Genito-Urinary ROS: negative for - dysuria, hematuria, incontinence or urinary frequency/urgency Musculoskeletal ROS: negative for - joint swelling or muscular  weakness Neurological ROS: as noted in HPI Dermatological ROS: negative for rash and skin lesion changes  Neurologic Examination:                                                                                                      Blood pressure 139/69, pulse 52, temperature 98 F (36.7 C), temperature source Oral, resp. rate 18, SpO2 99.00%.  Mental Status: Alert, oriented, thought content appropriate.  Speech child like and fluent without evidence of aphasia.  Able to follow 3 step commands without difficulty. Cranial Nerves: II: Discs flat bilaterally; Visual fields grossly normal, pupils equal, round, reactive to light and accommodation III,IV, VI: ptosis not present, extra-ocular  motions intact bilaterally V,VII: smile symmetric, facial light touch sensation normal bilaterally VIII: hearing normal bilaterally IX,X: gag reflex present XI: bilateral shoulder shrug XII: midline tongue extension Motor: Right : Upper extremity   4+/5    Left:     Upper extremity   5/5  Lower extremity   4+/5     Lower extremity   5/5 -- Mild drift right arm --positive drift on right leg  Tone and bulk:normal tone throughout; no atrophy noted Sensory:decreased on the right leg and arm  Deep Tendon Reflexes:  Right: Upper Extremity   Left: Upper extremity   biceps (C-5 to C-6) 2/4   biceps (C-5 to C-6) 2/4 tricep (C7) 2/4    triceps (C7) 2/4 Brachioradialis (C6) 2/4  Brachioradialis (C6) 2/4  Lower Extremity Lower Extremity  quadriceps (L-2 to L-4) 2/4   quadriceps (L-2 to L-4) 2/4 Achilles (S1) 2/4   Achilles (S1) 2/4  Plantars: Right: downgoing   Left: downgoing Cerebellar: normal finger-to-nose,  normal heel-to-shin test Gait: not assessed due to multiple medical monitors in th eED setting.  CV: pulses palpable throughout    Results for orders placed during the hospital encounter of 08/10/13 (from the past 48 hour(s))  GLUCOSE, CAPILLARY     Status: None   Collection Time    08/10/13  10:01 AM      Result Value Range   Glucose-Capillary 89  70 - 99 mg/dL  POCT I-STAT, CHEM 8     Status: Abnormal   Collection Time    08/10/13 10:25 AM      Result Value Range   Sodium 139  135 - 145 mEq/L   Potassium 2.8 (*) 3.5 - 5.1 mEq/L   Chloride 100  96 - 112 mEq/L   BUN 22  6 - 23 mg/dL   Creatinine, Ser 6.21  0.50 - 1.10 mg/dL   Glucose, Bld 88  70 - 99 mg/dL   Calcium, Ion 3.08  6.57 - 1.23 mmol/L   TCO2 27  0 - 100 mmol/L   Hemoglobin 11.9 (*) 12.0 - 15.0 g/dL   HCT 84.6 (*) 96.2 - 95.2 %   Comment NOTIFIED PHYSICIAN     Ct Head Wo Contrast  08/10/2013   CLINICAL DATA:  Dizziness  EXAM: CT HEAD WITHOUT CONTRAST  TECHNIQUE: Contiguous axial images were obtained from the base of the skull through the vertex without intravenous contrast.  COMPARISON:  None.  FINDINGS: The ventricles are normal size and configuration. No significant extra-axial fluid collection is identified. No findings to suggest acute hemorrhage, acute infarction or space-occupying mass lesion are noted.  IMPRESSION: No acute abnormality is noted.  These results were called by telephone at the time of interpretation on 08/10/2013 at 10:22 AM to Dr. Amada Jupiter, who verbally acknowledged these results.   Electronically Signed   By: Alcide Clever   On: 08/10/2013 10:26    Assessment and plan discussed with with attending physician and they are in agreement.    Felicie Morn PA-C [facility] [phone]  08/10/2013, 10:33 AM   Assessment: 57 y.o. female with a history of right sided numbness and weakness that started at 5:40 am and is improving. Symptoms are most consistent with a small subcortical stroke, though with improving symptoms, TIA is also a possibiltiy.   Stroke Risk Factors - hyperlipidemia and hypertension, family history of stroke at young age, cardiovascular disease  1. HgbA1c, fasting lipid panel 2. MRI, MRA  of the brain without contrast 3. Frequent  neuro checks 4.  Echocardiogram 5. Carotid dopplers 6. Prophylactic therapy-Antiplatelet med: Aspirin - dose 325mg  7. Risk factor modification 8. Telemetry monitoring 9. PT consult, OT consult, Speech consult  Ritta Slot, MD [facility] [phone]  If 7pm- 7am, please page neurology on call at [phone].

## 2013-08-11 DIAGNOSIS — I252 Old myocardial infarction: Secondary | ICD-10-CM

## 2013-08-11 DIAGNOSIS — E785 Hyperlipidemia, unspecified: Secondary | ICD-10-CM

## 2013-08-11 DIAGNOSIS — I6340 Cerebral infarction due to embolism of unspecified cerebral artery: Principal | ICD-10-CM

## 2013-08-11 DIAGNOSIS — I10 Essential (primary) hypertension: Secondary | ICD-10-CM

## 2013-08-11 DIAGNOSIS — M6281 Muscle weakness (generalized): Secondary | ICD-10-CM

## 2013-08-11 DIAGNOSIS — I359 Nonrheumatic aortic valve disorder, unspecified: Secondary | ICD-10-CM

## 2013-08-11 LAB — BASIC METABOLIC PANEL
BUN: 18 mg/dL (ref 6–23)
CO2: 27 mEq/L (ref 19–32)
Calcium: 9.2 mg/dL (ref 8.4–10.5)
Glucose, Bld: 93 mg/dL (ref 70–99)
Potassium: 4.4 mEq/L (ref 3.5–5.1)
Sodium: 138 mEq/L (ref 135–145)

## 2013-08-11 LAB — LIPID PANEL
HDL: 60 mg/dL (ref >39)
LDL Cholesterol: 104 mg/dL — ABNORMAL HIGH (ref 0–99)
Triglycerides: 196 mg/dL — ABNORMAL HIGH (ref <150)
VLDL: 39 mg/dL (ref 0–40)

## 2013-08-11 LAB — HEMOGLOBIN A1C
Hgb A1c MFr Bld: 5.8 % — ABNORMAL HIGH (ref <5.7)
Mean Plasma Glucose: 120 mg/dL — ABNORMAL HIGH (ref <117)

## 2013-08-11 MED ORDER — ATORVASTATIN CALCIUM 40 MG PO TABS: 40.0000 mg | ORAL_TABLET | Freq: Every day | ORAL | Status: DC

## 2013-08-11 MED ORDER — ACETAMINOPHEN 500 MG PO TABS
500.0000 mg | ORAL_TABLET | Freq: Four times a day (QID) | ORAL | Status: DC | PRN
  Administered 2013-08-11: 500 mg via ORAL
  Filled 2013-08-11: qty 1

## 2013-08-11 MED ORDER — POTASSIUM CHLORIDE 10 MEQ/100ML IV SOLN
10.0000 meq | INTRAVENOUS | Status: AC
  Administered 2013-08-11 (×2): 10 meq via INTRAVENOUS
  Filled 2013-08-11 (×2): qty 100

## 2013-08-11 MED ORDER — ASPIRIN 325 MG PO TBEC: 325.0000 mg | DELAYED_RELEASE_TABLET | Freq: Every day | ORAL | Status: DC

## 2013-08-11 MED ORDER — STROKE: EARLY STAGES OF RECOVERY BOOK
Freq: Once | Status: AC
  Administered 2013-08-11: 06:00:00
  Filled 2013-08-11: qty 1

## 2013-08-11 MED ORDER — STUDY - INVESTIGATIONAL DRUG SIMPLE RECORD: 75.0000 mg | Freq: Every day | Status: DC

## 2013-08-11 NOTE — Progress Notes (Signed)
VASCULAR LAB PRELIMINARY  PRELIMINARY  PRELIMINARY  PRELIMINARY  Carotid duplex completed.    Preliminary report:  Bilateral:  1-39% ICA stenosis.  Vertebral artery flow is antegrade.     Rajohn Henery, RVS 08/11/2013, 11:24 AM

## 2013-08-11 NOTE — Progress Notes (Signed)
  Echocardiogram 2D Echocardiogram has been performed.  [name] FRANCES 08/11/2013, 11:27 AM

## 2013-08-11 NOTE — Progress Notes (Signed)
Talked to patient about outpatient rehab choices, patient requested [facility]; orders and clinical information faxed as requested; B Ave Filter RN,BSN,MHA

## 2013-08-11 NOTE — Progress Notes (Signed)
Stroke Team Progress Note  HISTORY [name] is an 57 y.o. female who last seen normal at 3. She woke up at 0430 took a shower and then drove to work. While driving to work at 1610 she noted her car "pulling to the left and she was weak on her right." When she arrived at work she felt "dizzy" and was sent to urgent care. While in urgent care they noted ECG changes and transferred her to [facility]. While in [place] she mentioned her right arm and leg had decreased sensation and strength. intitially she said her right face had decreased sensation but then stated it was normal. She continued to have subjective decreased sensation on her right arm and leg.   Date last known well: Date: 08/10/2013  Time last known well: Time: 05:40  tPA Given: No: Out of window  NIHSS: 2  Patient was not a TPA candidate secondary to out of window. She was admitted  for further evaluation and treatment.  SUBJECTIVE Her husband is at the bedside.  Overall she feels her condition is rapidly improving.   OBJECTIVE Most recent Vital Signs: Filed Vitals:   08/11/13 0230 08/11/13 0430 08/11/13 0930 08/11/13 1350  BP: 143/75 162/79 128/69 135/68  Pulse: 52 52 62 64  Temp: 98.1 F (36.7 C) 98 F (36.7 C) 98.6 F (37 C) 98.2 F (36.8 C)  TempSrc: Oral Oral Oral Oral  Resp: 20 16 18 18   Height:      Weight:      SpO2: 98% 98% 96% 97%   CBG (last 3)   Recent Labs  08/10/13 1001  GLUCAP 89    IV Fluid Intake:     MEDICATIONS  . aspirin EC  325 mg Oral Daily  . atorvastatin  40 mg Oral q1800  . FLUoxetine  40 mg Oral Daily  . influenza vac split quadrivalent PF  0.5 mL Intramuscular Tomorrow-1000  . nitrofurantoin  50 mg Oral Daily  . pantoprazole  40 mg Oral Daily  . POINT study - Placebo / clopidogrel (daily dose)  (PI-Nechama Escutia)  75 mg Oral Q breakfast  . sodium chloride  3 mL Intravenous Q12H   PRN:  acetaminophen, nitroGLYCERIN, zolpidem  Diet:  Cardiac thin liquids Activity:   Up with assistance DVT  Prophylaxis:  SCD  CLINICALLY SIGNIFICANT STUDIES Basic Metabolic Panel:  Recent Labs Lab 08/10/13 1020 08/10/13 1025 08/11/13 0612  NA 135 139 138  K 2.8* 2.8* 4.4  CL 97 100 102  CO2 24  --  27  GLUCOSE 82 88 93  BUN 21 22 18   CREATININE 0.96 1.10 0.86  CALCIUM 9.3  --  9.2   Liver Function Tests:  Recent Labs Lab 08/10/13 1020  AST 17  ALT 15  ALKPHOS 80  BILITOT 0.3  PROT 7.2  ALBUMIN 3.8   CBC:  Recent Labs Lab 08/10/13 1020 08/10/13 1025  WBC 10.4  --   NEUTROABS 7.2  --   HGB 11.9* 11.9*  HCT 33.9* 35.0*  MCV 92.9  --   PLT 249  --    Coagulation:  Recent Labs Lab 08/10/13 1020  LABPROT 13.9  INR 1.09   Cardiac Enzymes:  Recent Labs Lab 08/10/13 1020 08/10/13 1556 08/10/13 2220  TROPONINI <0.30 <0.30 <0.30   Urinalysis:  Recent Labs Lab 08/10/13 1308  COLORURINE YELLOW  LABSPEC 1.013  PHURINE 6.0  GLUCOSEU NEGATIVE  HGBUR NEGATIVE  BILIRUBINUR NEGATIVE  KETONESUR NEGATIVE  PROTEINUR NEGATIVE  UROBILINOGEN 0.2  NITRITE  NEGATIVE  LEUKOCYTESUR NEGATIVE   Lipid Panel    Component Value Date/Time   CHOL 203* 08/11/2013 0612   TRIG 196* 08/11/2013 0612   HDL 60 08/11/2013 0612   CHOLHDL 3.4 08/11/2013 0612   VLDL 39 08/11/2013 0612   LDLCALC 104* 08/11/2013 0612   HgbA1C  Lab Results  Component Value Date   HGBA1C 5.8* 08/11/2013    Urine Drug Screen:     Component Value Date/Time   LABOPIA NONE DETECTED 08/10/2013 1308   COCAINSCRNUR NONE DETECTED 08/10/2013 1308   LABBENZ NONE DETECTED 08/10/2013 1308   AMPHETMU NONE DETECTED 08/10/2013 1308   THCU NONE DETECTED 08/10/2013 1308   LABBARB NONE DETECTED 08/10/2013 1308    Alcohol Level:  Recent Labs Lab 08/10/13 1020  ETH <11    Ct Head Wo Contrast 08/10/2013  No acute abnormality is noted.    Mr Brain Wo Contrast 08/10/2013  Small foci of acute infarct involving the posterior limb internal capsule on the right and posterior insula on the right.  Chronic microvascular  ischemia, Mild to moderate in degree.     MRA of the brain    2D Echocardiogram  EF 60%, wall motion normal. LA normal size.  Carotid Doppler   Bilateral: 1-39% ICA stenosis. Vertebral artery flow is antegrade.   CXR    EKG  normal sinus rhythm.   Therapy Recommendations outpatient speech and physical therapy  Physical Exam   Mental Status:  Alert, oriented, thought content appropriate. Speech child like and fluent without evidence of aphasia. Able to follow 3 step commands without difficulty.  Cranial Nerves:  II: Discs flat bilaterally; Visual fields grossly normal, pupils equal, round, reactive to light and accommodation  III,IV, VI: ptosis not present, extra-ocular motions intact bilaterally  V,VII: smile symmetric, facial light touch sensation normal bilaterally  VIII: hearing normal bilaterally  IX,X: gag reflex present  XI: bilateral shoulder shrug  XII: midline tongue extension  Motor:  Right : Upper extremity 4+/5 Left: Upper extremity 5/5  Lower extremity 4+/5 Lower extremity 5/5  -- Mild drift right arm  --positive drift on right leg  Tone and bulk:normal tone throughout; no atrophy noted  Sensory:decreased on the right leg and arm  Deep Tendon Reflexes:  Right: Upper Extremity Left: Upper extremity  biceps (C-5 to C-6) 2/4 biceps (C-5 to C-6) 2/4  tricep (C7) 2/4 triceps (C7) 2/4  Brachioradialis (C6) 2/4 Brachioradialis (C6) 2/4  Lower Extremity Lower Extremity  quadriceps (L-2 to L-4) 2/4 quadriceps (L-2 to L-4) 2/4  Achilles (S1) 2/4 Achilles (S1) 2/4  Plantars:  Right: downgoing Left: downgoing  Cerebellar:  normal finger-to-nose, normal heel-to-shin test  Gait: not assessed due to multiple medical monitors in th eED setting.  CV: pulses palpable throughout    ASSESSMENT [name] is a 57 y.o. female presenting with right hemi-sensory loss. Imaging confirms small foci of acute infarct involving the posterior limb internal capsule on the right and  posterior insula on the right. Infarct felt to be embolic secondary to unknown source.  On aspirin 81 mg orally every day prior to admission. Now on aspirin 325 mg orally every day and POINT STUDY DRUG for secondary stroke prevention. Patient with resultant right sided numbness. Work up underway.   Hypertension  Hyperlipidemia, LDL 104, goal < 100, increased statin  Family history of stroke at young age  HGB A1C   5.8  Hospital day # 1  TREATMENT/PLAN  Continue aspirin 325 mg orally every day and  POINT STUDY DRUG for secondary stroke prevention.  Risk factor modification  Upon discharge, patient will need to have RN/Pharm to dispense study drug.  Dr. Carver Fila Team will call the patient once discharged to be seen in clinic in 3 months.  Gwendolyn Lima. Manson Passey, [facility], MBA, MHA [facility] Pager: [phone] 08/11/2013 4:44 PM  I have personally obtained a history, examined the patient, evaluated imaging results, and formulated the assessment and plan of care. I agree with the above. Delia Heady, MD

## 2013-08-11 NOTE — H&P (Signed)
Family Medicine Teaching Service Attending Note  I interviewed and examined patient [name] and reviewed their tests and x-rays.  I discussed with Dr. Konrad Dolores and reviewed their note for today.  I agree with their assessment and plan.     Additionally  Feeling better but still some right sided face droop and mild weakness Relatively young female with several vascular events despite good HDL Check echo for reduced ej fraction that could warrant warfarin Consider ESR to look for unknown inflammatory condition - would only investigate if very high > 100 Appreciate neurology input

## 2013-08-11 NOTE — Evaluation (Signed)
Physical Therapy Evaluation Patient Details Name: [name] MRN: [id number] DOB: [birth year] Today's Date: 08/11/2013 Time: 4098-1191 PT Time Calculation (min): 18 min  PT Assessment / Plan / Recommendation History of Present Illness  Pt is an 57 y.o. female who last seen normal at 0540. She woke up at 0430 took a shower and then drove to work. While driving to work at 4782 she noted her car "pulling to the left and she was weak on her right."  When she arrived at work she felt "dizzy" and was sent to urgent care.  While in urgent care they noted ECG changes and transferred her to [facility].  While in [place] she mentioned her right arm and leg had decreased sensation and strength. intitially she said her right face had decreased sensation but then stated it was normal.  She continued to have subjective decreased sensation on her right arm and leg.  MRI findings: Small foci of acute infarct involving the posterior limb internal capsule on the right and posterior insula on the right.  Clinical Impression  Pt admitted with R-sided weakness and CVA. Pt currently with functional limitations due to the deficits listed below (see PT Problem List). Pt able to ambulate 200' today with min guard and perform bed mobility and transfers with supervision. Pt would benefit from gait training with SPC in order to improve balance during amb.  PT discussed OPPT-neurorehab with pt in order to improve balance and RLE strength, pt agreeable.  Pt will benefit from skilled PT to increase their independence and safety with mobility to allow discharge home.    PT Assessment  Patient needs continued PT services    Follow Up Recommendations  Outpatient PT (OPPT neurorehab)    Does the patient have the potential to tolerate intense rehabilitation      Barriers to Discharge        Equipment Recommendations  Other (comment) (TBD: will trial SPC next visit)    Recommendations for Other Services     Frequency Min 4X/week     Precautions / Restrictions Precautions Precautions: Fall Restrictions Weight Bearing Restrictions: No   Pertinent Vitals/Pain No c/o of pain during session. Pt reports SOB without chest pain, present at all times since MI in May 2014. Pt SOB did not increase upon amb.      Mobility  Bed Mobility Bed Mobility: Supine to Sit;Sit to Supine;Sitting - Scoot to Edge of Bed Supine to Sit: 5: Supervision;With rails Sitting - Scoot to Edge of Bed: 5: Supervision Sit to Supine: 5: Supervision;With rail Details for Bed Mobility Assistance: Supervision to ensure safety. Transfers Transfers: Sit to Stand;Stand to Sit Sit to Stand: 4: Min guard;With upper extremity assist;From bed;From toilet Stand to Sit: To bed;4: Min guard;With upper extremity assist;To toilet Details for Transfer Assistance: Min guard to ensure safety due to RLE weakness.  Ambulation/Gait Ambulation/Gait Assistance: 4: Min guard Ambulation Distance (Feet): 200 Feet Assistive device: None Ambulation/Gait Assistance Details: Min guard to ensure safety due to pt reports feeling unsteady since MI in May 2014 and RLE weakness. Pt reports SOB with no chest pain at rest and during amb. but no dizziness. Gait Pattern: Step-through pattern;Decreased stride length;Decreased dorsiflexion - left;Narrow base of support;Decreased trunk rotation Gait velocity: Decreased General Gait Details: Pt amb. with occassional narrow BOS and holds RUE with L hand, when asked why she holds on to R UE pt reports "because RUE feels weak".  Stairs: No Modified Rankin (Stroke Patients Only) Pre-Morbid Rankin Score: No symptoms Modified  Rankin: Moderately severe disability    Exercises     PT Diagnosis: Difficulty walking;Other (comment) (R LE weakness)  PT Problem List: Decreased strength;Decreased activity tolerance;Decreased balance;Decreased mobility PT Treatment Interventions: DME instruction;Gait training;Functional mobility training;Therapeutic  activities;Therapeutic exercise;Balance training;Neuromuscular re-education;Patient/family education (DME if necessary)     PT Goals(Current goals can be found in the care plan section) Acute Rehab PT Goals Patient Stated Goal: to get back to work PT Goal Formulation: With patient Time For Goal Achievement: 08/25/13 Potential to Achieve Goals: Good  Visit Information  Last PT Received On: 08/11/13 Assistance Needed: +1 History of Present Illness: Pt is an 57 y.o. female who last seen normal at 0540. She woke up at 0430 took a shower and then drove to work. While driving to work at 4098 she noted her car "pulling to the left and she was weak on her right."  When she arrived at work she felt "dizzy" and was sent to urgent care.  While in urgent care they noted ECG changes and transferred her to [facility]  While in [place] she mentioned her right arm and leg had decreased sensation and strength. intitially she said her right face had decreased sensation but then stated it was normal.  She continued to have subjective decreased sensation on her right arm and leg.  MRI findings: Small foci of acute infarct involving the posterior limb internal capsule on the right and posterior insula on the right.       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 Pt does not report numbness/tingling or decr. sensation   Balance Balance Balance Assessed: Yes Dynamic Standing Balance Dynamic Standing - Balance Support: Left upper extremity supported;During functional activity Dynamic Standing - Level of Assistance: 5: Stand by assistance Dynamic Standing - Balance Activities: Reaching for objects Dynamic Standing - Comments: Pt able to wash hands at sink with min guard to supervision with LUE occassionally supported on counter with no LOB episodes, but pt continues to report "feeling unsteady".  End of Session PT - End of Session Equipment Utilized During Treatment: Gait belt Activity Tolerance: Patient limited by fatigue Patient left: in bed;with family/visitor present;with call bell/phone within reach;Other (comment) (MD)  GP     Sol Blazing 08/11/2013, 11:30 AM
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
## 2013-08-11 NOTE — Progress Notes (Signed)
Family Medicine Teaching Service Daily Progress Note Intern Pager: [phone]  Patient name: [name] Medical record number: [id number] Date of birth: [birth year] Age: 57 y.o. Gender: female  Primary Care Provider: Clydie Braun, MD Consultants: neuro/stroke Code Status: full  Pt Overview and Major Events to Date:  10/2 - admitted with new CVA  Assessment and Plan: [name] is a 57 y.o. female presenting with dizziness and gait disturbance since this morning . PMH is significant for MI in May as well as HTN, HLD, migraines with aura and CHF (systolic EF 35%).   # CVA: MRI w/ R internal capsule infarct, outside TPA window. PT considering signing on to clinical POINT trial. Deficits on admission of R slight R facial droop, and R limb weakness. NEuro following and appreciate recommendations  - continue ASA 325  - Echo and carotid dopplers ordered  - increased home atorvastatin 10mg  to 40mg   - PT/OT/speech  - risk stratification labs ordered (chol 203, LDL 104 HDL 39, A1C pending) - home HTN meds, held to allow permissive HTN  - Point trial (clopidegral or placebo)   # CV: CHF systolic type w/ EF 35%. reporting SOB and feeling similar to prior MI. No current MI after reviewing previous EKG as Trop neg as well.  - trop neg x3 - reviewed PCP records including old EKG and Echo  - repeat echo today  - increased lipitor as above  - holding home isordil, dyazide, lasix, nitro in post-stroke phase  - rpt EKG in am  - monitor fluid status closely, restart lasix when needed as BP allows  # Hypokalemia: Likely secondary to Lasix. K 2.8 in ED, EKG with long QTc (589), now normal    - monitor bmet, K 4.4 this am   - monitor on tele   FEN/GI: heart healthy diet, no IVF, home PPI  Prophylaxis: SCDs, in trial so no lovenox   Disposition: admitted to telemetry pending clinical improvement and further work-up  Subjective: Feeling stronger this morning, ambulated with PT, boyfriend  reporting improvement as well. No pain, no sob.  Objective: Temp:  [97.8 F (36.6 C)-98.5 F (36.9 C)] 98 F (36.7 C) (10/03 0430) Pulse Rate:  [52-88] 52 (10/03 0430) Resp:  [12-21] 16 (10/03 0430) BP: (117-162)/(60-84) 162/79 mmHg (10/03 0430) SpO2:  [95 %-100 %] 98 % (10/03 0430) Weight:  [135 lb (61.236 kg)-135 lb 9.6 oz (61.508 kg)] 135 lb 9.6 oz (61.508 kg) (10/02 2152) Physical Exam: General: NAD, Sitting in bed  HEENT: MMM, PERRL, EOMI  Neck: Supple, no LAD  Cardiovascular: RRR, no murmur, no JVD  Respiratory: basilar crackles on the right, otherwise CTAB, normal WOB  Abdomen: soft, nontender, nondistended  Extremities: WWP, no LE edema  Skin: no rashes  Neuro: strength 3/5 R and 5/5 left upper and lower extremities, right facial droop, mild gait instability  Laboratory:  Recent Labs Lab 08/10/13 1020 08/10/13 1025  WBC 10.4  --   HGB 11.9* 11.9*  HCT 33.9* 35.0*  PLT 249  --     Recent Labs Lab 08/10/13 1020 08/10/13 1025 08/11/13 0612  NA 135 139 138  K 2.8* 2.8* 4.4  CL 97 100 102  CO2 24  --  27  BUN 21 22 18   CREATININE 0.96 1.10 0.86  CALCIUM 9.3  --  9.2  PROT 7.2  --   --   BILITOT 0.3  --   --   ALKPHOS 80  --   --   ALT 15  --   --  AST 17  --   --   GLUCOSE 82 88 93   Trop neg x3  Imaging/Diagnostic Tests: MRI brain: Small foci of acute infarct involving the posterior limb internal  capsule on the right and posterior insula on the right. Chronic microvascular ischemia, Mild to moderate in degree   EKG 10/2: NSR, inverted T-waves in V2 and V3, QTc prolonged (589) EKG 10/3: NSR, inverted T-waves in V2 and V3, QTc normalized (408)  [name] Low, MD 08/11/2013, 7:33 AM PGY-1, [facility] FPTS Intern pager: [phone], text pages welcome

## 2013-08-11 NOTE — Progress Notes (Signed)
FMTS Attending Admission Note: [name] Wardrop,MD I  have seen and examined this patient, reviewed their chart. I have discussed this patient with the resident. I agree with the resident's findings, assessment and care plan.  

## 2013-08-11 NOTE — Progress Notes (Signed)
Pt was discharged on stable condition with discharge instructions and appointment given. Plavix 75mg  given. Went home with relatives

## 2013-08-11 NOTE — Progress Notes (Signed)
   CARE MANAGEMENT NOTE 08/11/2013  Patient:  [name], [name]   Account Number:  [id number]  Date Initiated:  08/11/2013  Documentation initiated by:  Jiles Crocker  Subjective/Objective Assessment:   ADMITTED WITH CVA     Action/Plan:   PCP IS DR DAVID Sampson Goon;  INDEPENDENT PRIOR TO ADMISSION; CM FOLLOWING FOR DCP   Anticipated DC Date:  08/18/2013   Anticipated DC Plan:  POSSIBLY HOME/SELF CARE, AWAITING ON PT/OT EVALS FOR DISPOSITION NEEDS;     DC Planning Services  CM consult         Status of service:  In process, will continue to follow Medicare Important Message given?  NA - LOS <3 / Initial given by admissions (If response is "NO", the following Medicare IM given date fields will be blank)  Per UR Regulation:  Reviewed for med. necessity/level of care/duration of stay  Comments:  08/11/2013- B Jonaya Freshour RN,BSN,MHA
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
## 2013-08-11 NOTE — Evaluation (Signed)
I have reviewed this note and agree with all findings. Kati Vonnetta Akey, PT, DPT Pager: [phone]   

## 2013-08-11 NOTE — Progress Notes (Signed)
Interim Progress Note:  Pt swallowing without difficulty, tolerating heart healthy diet. Previously passed initial swallow screen. Will D/C.SLP eval and treat.

## 2013-08-12 NOTE — Discharge Summary (Addendum)
FMTS Attending Admission Note: [name] Costlow,MD I  have seen and examined this patient, reviewed their chart. I have discussed this patient with the resident. I agree with the resident's findings, assessment and care plan. As per Neurology;Dr. Sethi/Research Team will call the patient once discharged to be seen in clinic in 3 months.

## 2013-08-12 NOTE — Discharge Summary (Signed)
Family Medicine Teaching [facility] Discharge Summary  Patient name: [name] Medical record number: [id number] Date of birth: [birth year] Age: 57 y.o. Gender: female Date of Admission: 08/10/2013  Date of Discharge: 08/11/13 Admitting Physician: Janit Pagan, MD  Primary Care Provider: Clydie Braun, MD Consultants: neuro  Indication for Hospitalization: New CVA  Discharge Diagnoses/Problem List:  Patient Active Problem List   Diagnosis Date Noted  . Right sided weakness 08/10/2013  . Hypertension 08/10/2013  . Hyperlipidemia 08/10/2013  . History of MI (myocardial infarction) 08/10/2013  . Stroke, acute, embolic 08/10/2013   Disposition: Home with outpatient rehab  Discharge Condition: Improved  Discharge Exam:  General: NAD, Sitting in bed  HEENT: MMM, PERRL, EOMI  Neck: Supple, no LAD  Cardiovascular: RRR, no murmur, no JVD  Respiratory: bibasilar crackles, otherwise CTAB, normal WOB  Abdomen: soft, nontender, nondistended  Extremities: WWP, no LE edema  Skin: no rashes  Neuro: strength 3/5 R and 5/5 left upper and lower extremities, right facial droop, mild gait instability  Brief Hospital Course:  # Stroke: Pt presented with new dizziness, confusion and right-sided weakness and facial droop. Found to have right internal capsule stroke on MRI. Got ASA 325 and Point trial (plavix vs. placebo). Echo was unchanged from prior, no clot visualized. Carotid dopplers were negative. She improved with PT/OT/speech.  # CHF/h/o MI: EKG unchanged from prior. Increased home dose of lipitor to 40mg . Held antihypertensives while post-stroke but restarted on d/c. ASA dose increase for stroke treatment.  Issues for Follow Up:  Stroke: Speech therapy may be of benefit in addition to PT/OT as she did have some word-finding difficulty. Pt is in a trial so is taking either plavix or placebo.  CV: Echo improved from prior without evidence of systolic dysfunction. Consider adding  ACE for secondary MI prevention.  Significant Procedures: none  Significant Labs and Imaging:   Recent Labs Lab 08/10/13 1020 08/10/13 1025  WBC 10.4  --   HGB 11.9* 11.9*  HCT 33.9* 35.0*  PLT 249  --     Recent Labs Lab 08/10/13 1020 08/10/13 1025 08/11/13 0612  NA 135 139 138  K 2.8* 2.8* 4.4  CL 97 100 102  CO2 24  --  27  GLUCOSE 82 88 93  BUN 21 22 18   CREATININE 0.96 1.10 0.86  CALCIUM 9.3  --  9.2  ALKPHOS 80  --   --   AST 17  --   --   ALT 15  --   --   ALBUMIN 3.8  --   --   Hgb A1c: 5.8   08/11/2013 06:12  Cholesterol 203 (H)  Triglycerides 196 (H)  HDL 60  LDL (calc) 104 (H)  VLDL 39  Total CHOL/HDL Ratio 3.4   CT Head: The ventricles are normal size and configuration. No significant extra-axial fluid collection is identified. No findings to suggest  acute hemorrhage, acute infarction or space-occupying mass lesion  are noted.  MRI Brain: Small foci of acute infarct involving the posterior limb internal  capsule on the right and posterior insula on the right. Chronic microvascular ischemia, Mild to moderate in degree.  Carotid dopplers: 1-39% stenosis bilaterally  Echo: Left ventricle: The cavity size was normal. Wall thickness was normal. Systolic function was normal. The estimated ejection fraction was in the range of 60% to 65%. Wall motion was normal; there were no regional wall motion abnormalities. Aortic valve: Mild regurgitation.  Results/Tests Pending at Time of Discharge: none  Discharge Medications:  Medication List    STOP taking these medications       aspirin 81 MG tablet  Replaced by:  aspirin 325 MG EC tablet      TAKE these medications       ALPRAZolam 1 MG tablet  Commonly known as:  XANAX  Take 1 mg by mouth at bedtime as needed for sleep.     aspirin 325 MG EC tablet  Take 1 tablet (325 mg total) by mouth daily.     atorvastatin 40 MG tablet  Commonly known as:  LIPITOR  Take 1 tablet (40 mg total) by  mouth daily at 6 PM.     FLUoxetine 40 MG capsule  Commonly known as:  PROZAC  Take 40 mg by mouth daily.     furosemide 40 MG tablet  Commonly known as:  LASIX  Take 40 mg by mouth daily.     INVESTIGATIONAL DRUG SIMPLE RECORD  Take 75 mg by mouth daily with breakfast.     isosorbide mononitrate 60 MG 24 hr tablet  Commonly known as:  IMDUR  Take 30 mg by mouth daily.     LORazepam 0.5 MG tablet  Commonly known as:  ATIVAN  Take 0.5 mg by mouth every 4 (four) hours as needed for anxiety.     metoprolol succinate 25 MG 24 hr tablet  Commonly known as:  TOPROL-XL  Take 12.5 mg by mouth every morning.     nitrofurantoin 100 MG capsule  Commonly known as:  MACRODANTIN  Take 100 mg by mouth at bedtime.     nitroGLYCERIN 0.4 MG SL tablet  Commonly known as:  NITROSTAT  Place 0.4 mg under the tongue every 5 (five) minutes as needed for chest pain.     pantoprazole 40 MG tablet  Commonly known as:  PROTONIX  Take 40 mg by mouth every 12 (twelve) hours.     triamterene-hydrochlorothiazide 37.5-25 MG per capsule  Commonly known as:  DYAZIDE  Take 1 capsule by mouth daily.     zolpidem 5 MG tablet  Commonly known as:  AMBIEN  Take 5 mg by mouth at bedtime as needed for sleep.       Discharge Instructions: Please refer to Patient Instructions section of EMR for full details.  Patient was counseled important signs and symptoms that should prompt return to medical care, changes in medications, dietary instructions, activity restrictions, and follow up appointments.   Follow-Up Appointments:     Follow-up Information   Schedule an appointment as soon as possible for a visit with Clydie Braun, MD.   Specialty:  Infectious Diseases   Contact information:   [facility], Internal Medicine [address] [place] [address] [phone]      Beverely Low, MD 08/12/2013, 10:29 AM PGY-1, [facility]

## 2013-08-13 NOTE — Patient Instructions (Addendum)
Patient sent to [facility] ER by EMS

## 2013-08-14 ENCOUNTER — Other Ambulatory Visit: Payer: Self-pay | Admitting: Family Medicine

## 2013-08-14 DIAGNOSIS — I639 Cerebral infarction, unspecified: Secondary | ICD-10-CM

## 2013-08-14 NOTE — ED Provider Notes (Signed)
I saw and evaluated the patient, reviewed the resident's note and I agree with the findings and plan and agree with their ECG interpretation. Patient difficulty using her right hand. NIH score 2 low for TPA. Also arrival time and would've made it very difficult to get within 4 1/2 hour window. admitted to internal medicine.   Juliet Rude. Rubin Payor, MD 08/14/13 [phone]

## 2013-08-14 NOTE — Progress Notes (Signed)
Orders for outpatient Physical therapy faxed again to [facility] as requested; B Shelba Flake

## 2013-11-15 ENCOUNTER — Ambulatory Visit (INDEPENDENT_AMBULATORY_CARE_PROVIDER_SITE_OTHER): Payer: Self-pay | Admitting: Neurology

## 2013-11-15 ENCOUNTER — Other Ambulatory Visit: Payer: Self-pay | Admitting: *Deleted

## 2013-11-15 VITALS — BP 116/70 | HR 71 | Temp 98.7°F | Resp 20

## 2013-11-15 DIAGNOSIS — R55 Syncope and collapse: Secondary | ICD-10-CM

## 2013-11-15 DIAGNOSIS — G459 Transient cerebral ischemic attack, unspecified: Secondary | ICD-10-CM

## 2013-11-15 DIAGNOSIS — Z0289 Encounter for other administrative examinations: Secondary | ICD-10-CM

## 2013-11-15 DIAGNOSIS — I639 Cerebral infarction, unspecified: Secondary | ICD-10-CM

## 2013-11-15 MED ORDER — CLOPIDOGREL BISULFATE 75 MG PO TABS: 75.0000 mg | ORAL_TABLET | Freq: Every day | ORAL | Status: DC

## 2013-11-15 MED ORDER — ASPIRIN 325 MG PO TBEC: 325.0000 mg | DELAYED_RELEASE_TABLET | Freq: Every day | ORAL | Status: DC

## 2013-11-15 NOTE — Progress Notes (Signed)
Patient was seen today for the 90/EOS follow up in the POINT trial. No reported AE's or SAE's during visit. Patient completed [place] and QVSFS quesitionnaire. NIHSS was zero. Dr. Leonie Man performed a brief exam and advised patient to start taking open-label Plavix. Patient reported have issues with remembering things. A MMSE was performed per Dr. Clydene Fake request. MMSE was 29/30 and Mood Depression scale was 8. Patient was advised to follow up with primary care physician for depression treatment. Patient is still taking Prozac for depression. Dr. Leonie Man also suggested patient to wear a  Cardiac montior for 30 days due to having reported weak episodes x 2 in December 2014. Patient was advised to follow up with Dr. Leonie Man in 2 months and to remain out of work until then.

## 2013-11-24 ENCOUNTER — Encounter: Payer: Self-pay | Admitting: Radiology

## 2013-11-24 ENCOUNTER — Encounter (INDEPENDENT_AMBULATORY_CARE_PROVIDER_SITE_OTHER): Payer: BC Managed Care – PPO

## 2013-11-24 DIAGNOSIS — R55 Syncope and collapse: Secondary | ICD-10-CM

## 2013-11-24 NOTE — Progress Notes (Signed)
Patient ID: [name], female   DOB: 02/23/1956, 58 y.o.   MRN: [id number] [place]

## 2013-12-10 HISTORY — PX: CARDIAC CATHETERIZATION: SHX172

## 2013-12-30 LAB — BASIC METABOLIC PANEL
Anion Gap: 4 — ABNORMAL LOW (ref 7–16)
BUN: 14 mg/dL (ref 7–18)
CO2: 27 mmol/L (ref 21–32)
CREATININE: 1.06 mg/dL (ref 0.60–1.30)
Calcium, Total: 9 mg/dL (ref 8.5–10.1)
Chloride: 105 mmol/L (ref 98–107)
GFR CALC NON AF AMER: 59 — AB
Glucose: 105 mg/dL — ABNORMAL HIGH (ref 65–99)
Osmolality: 273 (ref 275–301)
POTASSIUM: 3.2 mmol/L — AB (ref 3.5–5.1)
Sodium: 136 mmol/L (ref 136–145)

## 2013-12-30 LAB — CBC
HCT: 34.9 % — ABNORMAL LOW (ref 35.0–47.0)
HGB: 12 g/dL (ref 12.0–16.0)
MCH: 32.2 pg (ref 26.0–34.0)
MCHC: 34.3 g/dL (ref 32.0–36.0)
MCV: 94 fL (ref 80–100)
PLATELETS: 228 10*3/uL (ref 150–440)
RBC: 3.72 10*6/uL — ABNORMAL LOW (ref 3.80–5.20)
RDW: 12.9 % (ref 11.5–14.5)
WBC: 5.9 10*3/uL (ref 3.6–11.0)

## 2013-12-30 LAB — PRO B NATRIURETIC PEPTIDE: B-Type Natriuretic Peptide: 37 pg/mL (ref 0–125)

## 2013-12-30 LAB — TROPONIN I

## 2013-12-31 ENCOUNTER — Inpatient Hospital Stay: Payer: Self-pay

## 2013-12-31 LAB — BASIC METABOLIC PANEL
Anion Gap: 6 — ABNORMAL LOW (ref 7–16)
BUN: 13 mg/dL (ref 7–18)
CO2: 28 mmol/L (ref 21–32)
Calcium, Total: 9.1 mg/dL (ref 8.5–10.1)
Chloride: 108 mmol/L — ABNORMAL HIGH (ref 98–107)
Creatinine: 0.93 mg/dL (ref 0.60–1.30)
EGFR (Non-African Amer.): >60
Glucose: 92 mg/dL (ref 65–99)
Osmolality: 283 (ref 275–301)
POTASSIUM: 3.6 mmol/L (ref 3.5–5.1)
Sodium: 142 mmol/L (ref 136–145)

## 2013-12-31 LAB — CBC WITH DIFFERENTIAL/PLATELET
BASOS ABS: 0 10*3/uL (ref 0.0–0.1)
BASOS PCT: 0.8 %
Eosinophil #: 0.2 10*3/uL (ref 0.0–0.7)
Eosinophil %: 3.5 %
HCT: 34.1 % — AB (ref 35.0–47.0)
HGB: 11.8 g/dL — AB (ref 12.0–16.0)
Lymphocyte #: 2.2 10*3/uL (ref 1.0–3.6)
Lymphocyte %: 35.4 %
MCH: 32.5 pg (ref 26.0–34.0)
MCHC: 34.5 g/dL (ref 32.0–36.0)
MCV: 94 fL (ref 80–100)
Monocyte #: 0.8 x10 3/mm (ref 0.2–0.9)
Monocyte %: 12.6 %
NEUTROS PCT: 47.7 %
Neutrophil #: 2.9 10*3/uL (ref 1.4–6.5)
Platelet: 208 10*3/uL (ref 150–440)
RBC: 3.62 10*6/uL — ABNORMAL LOW (ref 3.80–5.20)
RDW: 12.8 % (ref 11.5–14.5)
WBC: 6.1 10*3/uL (ref 3.6–11.0)

## 2013-12-31 LAB — CK TOTAL AND CKMB (NOT AT ARMC)
CK, Total: 84 U/L
CK, Total: 80 U/L
CK-MB: 0.6 ng/mL (ref 0.5–3.6)
CK-MB: 0.7 ng/mL (ref 0.5–3.6)

## 2013-12-31 LAB — MAGNESIUM: MAGNESIUM: 1.9 mg/dL

## 2013-12-31 LAB — LIPID PANEL
CHOLESTEROL: 199 mg/dL (ref 0–200)
HDL: 41 mg/dL (ref 40–60)
LDL CHOLESTEROL, CALC: 92 mg/dL (ref 0–100)
TRIGLYCERIDES: 332 mg/dL — AB (ref 0–200)
VLDL CHOLESTEROL, CALC: 66 mg/dL — AB (ref 5–40)

## 2013-12-31 LAB — HEMOGLOBIN A1C: HEMOGLOBIN A1C: 6.4 % — AB (ref 4.2–6.3)

## 2013-12-31 LAB — TROPONIN I: Troponin-I: <0.02 ng/mL

## 2013-12-31 LAB — APTT
ACTIVATED PTT: 26.5 s (ref 23.6–35.9)
ACTIVATED PTT: 95.8 s — AB (ref 23.6–35.9)

## 2014-01-01 HISTORY — PX: CARDIAC CATHETERIZATION: SHX172

## 2014-01-01 HISTORY — PX: CORONARY ANGIOPLASTY WITH STENT PLACEMENT: SHX49

## 2014-01-01 LAB — CBC WITH DIFFERENTIAL/PLATELET
BASOS ABS: 0.1 10*3/uL (ref 0.0–0.1)
Basophil %: 1 %
EOS PCT: 4.4 %
Eosinophil #: 0.3 10*3/uL (ref 0.0–0.7)
HCT: 33.2 % — AB (ref 35.0–47.0)
HGB: 11.2 g/dL — ABNORMAL LOW (ref 12.0–16.0)
LYMPHS PCT: 37 %
Lymphocyte #: 2.2 10*3/uL (ref 1.0–3.6)
MCH: 31.9 pg (ref 26.0–34.0)
MCHC: 33.7 g/dL (ref 32.0–36.0)
MCV: 95 fL (ref 80–100)
MONOS PCT: 10 %
Monocyte #: 0.6 x10 3/mm (ref 0.2–0.9)
Neutrophil #: 2.8 10*3/uL (ref 1.4–6.5)
Neutrophil %: 47.6 %
Platelet: 205 10*3/uL (ref 150–440)
RBC: 3.51 10*6/uL — ABNORMAL LOW (ref 3.80–5.20)
RDW: 12.9 % (ref 11.5–14.5)
WBC: 5.9 10*3/uL (ref 3.6–11.0)

## 2014-01-01 LAB — BASIC METABOLIC PANEL
Anion Gap: 6 — ABNORMAL LOW (ref 7–16)
BUN: 10 mg/dL (ref 7–18)
CO2: 25 mmol/L (ref 21–32)
CREATININE: 0.88 mg/dL (ref 0.60–1.30)
Calcium, Total: 8.6 mg/dL (ref 8.5–10.1)
Chloride: 108 mmol/L — ABNORMAL HIGH (ref 98–107)
EGFR (African American): >60
EGFR (Non-African Amer.): >60
GLUCOSE: 89 mg/dL (ref 65–99)
OSMOLALITY: 276 (ref 275–301)
Potassium: 3.8 mmol/L (ref 3.5–5.1)
SODIUM: 139 mmol/L (ref 136–145)

## 2014-01-01 LAB — APTT: ACTIVATED PTT: 88.7 s — AB (ref 23.6–35.9)

## 2014-01-02 LAB — BASIC METABOLIC PANEL
Anion Gap: 4 — ABNORMAL LOW (ref 7–16)
BUN: 8 mg/dL (ref 7–18)
CHLORIDE: 108 mmol/L — AB (ref 98–107)
CO2: 28 mmol/L (ref 21–32)
Calcium, Total: 8.5 mg/dL (ref 8.5–10.1)
Creatinine: 0.9 mg/dL (ref 0.60–1.30)
EGFR (African American): >60
Glucose: 88 mg/dL (ref 65–99)
Osmolality: 277 (ref 275–301)
Potassium: 3.4 mmol/L — ABNORMAL LOW (ref 3.5–5.1)
Sodium: 140 mmol/L (ref 136–145)

## 2014-01-02 LAB — CK TOTAL AND CKMB (NOT AT ARMC)
CK, Total: 55 U/L
CK-MB: 0.6 ng/mL (ref 0.5–3.6)

## 2014-01-02 LAB — APTT: ACTIVATED PTT: 27.6 s (ref 23.6–35.9)

## 2014-01-10 ENCOUNTER — Ambulatory Visit: Payer: BC Managed Care – PPO | Admitting: Cardiovascular Disease

## 2014-01-16 ENCOUNTER — Encounter: Payer: Self-pay | Admitting: Neurology

## 2014-01-16 ENCOUNTER — Ambulatory Visit (INDEPENDENT_AMBULATORY_CARE_PROVIDER_SITE_OTHER): Payer: BC Managed Care – PPO | Admitting: Neurology

## 2014-01-16 VITALS — BP 118/73 | HR 73 | Ht 64.0 in | Wt 142.0 lb

## 2014-01-16 DIAGNOSIS — F3289 Other specified depressive episodes: Secondary | ICD-10-CM

## 2014-01-16 DIAGNOSIS — F32A Depression, unspecified: Secondary | ICD-10-CM

## 2014-01-16 DIAGNOSIS — R413 Other amnesia: Secondary | ICD-10-CM

## 2014-01-16 DIAGNOSIS — F329 Major depressive disorder, single episode, unspecified: Secondary | ICD-10-CM

## 2014-01-16 NOTE — Patient Instructions (Signed)
I had a long discussion the patient and his wife husband regarding her multitasking and cognitive difficulties which I feel are likely due to suboptimally treated depression. I will however refer the patient to neuropsychologist to delineate this further. Continue Prozac 40 mg daily for depression and followup and treatment as per her primary physician. Continue aspirin and Plavix not both for second stroke prevention given history of recent drug-coated cardiac stent. Strict control of lipids with LDL cholesterol goal below 70 mg percent and diabetes with hemoglobin A1c goal below 6.5%. Return for followup in 6 months or earlier if necessary

## 2014-01-16 NOTE — Progress Notes (Signed)
[facility] [address] [place]. [place] [address] [phone]       OFFICE FOLLOW-UP NOTE  [name] Date of Birth:  07/31/1956 Medical Record Number:  [id number]   HPI: 61 year Caucasian lady  seen for first office followup visit after finishing enrollment in the POINT trial  in January. She initially presented to [facility] on 10/2/4 and right-sided weakness, dizziness. She was transferred from her workplace to urgent care where they noticed some  EKG changes and sent her to [place] where she noticed decreased sensation and strength in the right arm and leg. She had some decreased sensation on the right face as well. Her NIH stroke scale was 2 she was not given TPA because she was out of the time window and symptoms were mild. She was enrolled in the POINT trial of aspirin plus placebo versus aspirin and Plavix for secondary stroke prevention. MRI scan of the brain did not reveal any acute left brain infarct but did show silent right basal ganglia infarct. MRA of the brain did not show large vessel stenosis. Carotid ultrasound showed no significant extracranial stenosis. Transthoracic echo showed normal ejection fraction. Urine drug screen was negative. Hemoglobin A1c was 5.8. Cholesterol was significant only for LDL over 104 mg percent. She was started honk point study medication In discharge home and did well. She finished enrollment in the study and when seen on the 90 day followup said that measures 0. She however has had some memory and cognitive difficulties which she states are persisted. She has long-standing history of depression and is on Prozac but feels her symptoms are still significant. She wants to refer to neuropsychologist. Her primary care physician did increase Prozac to 40 mg a day but she has not noted any obvious benefit. She was found to have 90% restenosis of a cardiac stent and underwent another cardiac stent in [place] 2 weeks ago.  She however is not happy with her cardiologist and plans to change to her new cardiologist Dr. Candis Musa soon. She is currently on medical leave till June 5 due to her cardiac condition.  ROS:   14 system review of systems is positive for insomnia, frequent waking, snoring, memory loss, dizziness, headache, confusion, coordination problem , cardiac stent and depression only and all of the systems negative.  PMH:  Past Medical History  Diagnosis Date  . Hypertension   . Hypercholesteremia   . NSTEMI (non-ST elevated myocardial infarction) 03/2014  . CAD (coronary artery disease)   . CVA (cerebral vascular accident) 08/2013  . Tobacco abuse   . Anxiety   . Depression   . Migraines   . CHF (congestive heart failure)     EF 35%    Social History:  History   Social History  . Marital Status: Unknown    Spouse Name: N/A    Number of Children: N/A  . Years of Education: N/A   Occupational History  . Not on file.   Social History Main Topics  . Smoking status: Former Smoker    Quit date: 04/07/2013  . Smokeless tobacco: Not on file  . Alcohol Use: No  . Drug Use: No  . Sexual Activity: Yes   Other Topics Concern  . Not on file   Social History Narrative  . No narrative on file    Medications:   Current Outpatient Prescriptions on File Prior to Visit  Medication Sig Dispense Refill  . ALPRAZolam (XANAX) 1 MG tablet Take  1 mg by mouth at bedtime as needed for sleep.      Marland Kitchen atorvastatin (LIPITOR) 40 MG tablet Take 1 tablet (40 mg total) by mouth daily at 6 PM.  30 tablet  0  . clopidogrel (PLAVIX) 75 MG tablet Take 1 tablet (75 mg total) by mouth daily.  30 tablet  11  . FLUoxetine (PROZAC) 40 MG capsule Take 40 mg by mouth daily.      . isosorbide mononitrate (IMDUR) 60 MG 24 hr tablet Take 30 mg by mouth daily.      Marland Kitchen LORazepam (ATIVAN) 0.5 MG tablet Take 0.5 mg by mouth every 4 (four) hours as needed for anxiety.      . metoprolol succinate (TOPROL-XL) 25 MG 24 hr tablet  Take 12.5 mg by mouth every morning.      . nitrofurantoin (MACRODANTIN) 100 MG capsule Take 100 mg by mouth at bedtime.      . nitroGLYCERIN (NITROSTAT) 0.4 MG SL tablet Place 0.4 mg under the tongue every 5 (five) minutes as needed for chest pain.      . pantoprazole (PROTONIX) 40 MG tablet Take 40 mg by mouth every 12 (twelve) hours.      . triamterene-hydrochlorothiazide (DYAZIDE) 37.5-25 MG per capsule Take 1 capsule by mouth daily.      Marland Kitchen zolpidem (AMBIEN) 5 MG tablet Take 5 mg by mouth at bedtime as needed for sleep.       No current facility-administered medications on file prior to visit.    Allergies:   Allergies  Allergen Reactions  . Codeine Sulfate   . Penicillins Nausea And Vomiting    Physical Exam General: well developed, well nourished, seated, in no evident distress Head: head normocephalic and atraumatic. Orohparynx benign Neck: supple with no carotid or supraclavicular bruits Cardiovascular: regular rate and rhythm, no murmurs Musculoskeletal: no deformity Skin:  no rash/petichiae Vascular:  Normal pulses all extremities Filed Vitals:   01/16/14 1638  BP: 118/73  Pulse: 73   Neurologic Exam Mental Status: Awake and fully alert. Oriented to place and time. Recent and remote memory intact. Attention span, concentration and fund of knowledge appropriate. Mood and affect appropriate. Mini-Mental status exam 29/30. Animal fluency test 13. Clock drawing 4/4. Geriatric depression scale and suggestive of mild to moderate depression. Cranial Nerves: Fundoscopic exam reveals sharp disc margins. Pupils equal, briskly reactive to light. Extraocular movements full without nystagmus. Visual fields full to confrontation. Hearing intact. Facial sensation intact. Face, tongue, palate moves normally and symmetrically.  Motor: Normal bulk and tone. Normal strength in all tested extremity muscles. Sensory.: intact to touch and pinprick and vibratory sensation.  Coordination: Rapid  alternating movements normal in all extremities. Finger-to-nose and heel-to-shin performed accurately bilaterally. Gait and Station: Arises from chair without difficulty. Stance is normal. Gait demonstrates normal stride length and balance . Able to heel, toe and tandem walk without difficulty.  Reflexes: 1+ and symmetric. Toes downgoing.   NIHSS  0 Modified Rankin  1   ASSESSMENT: 57 year patient left brain TIA and a silent right internal capsule infarct secondary to small vessel disease in October 2014 who participated in the POINT trial. She is doing well from neurolvascularl standpoint but has memory loss and cognitive difficulties likely related to suboptimally treated depression.    PLAN: I had a long discussion the patient and his wife husband regarding her multitasking and cognitive difficulties which I feel are likely due to suboptimally treated depression. I will however refer the patient to  neuropsychologist to delineate this further. Continue Prozac 40 mg daily for depression and followup and treatment as per her primary physician. Continue aspirin and Plavix not both for second stroke prevention given history of recent drug-coated cardiac stent. Strict control of lipids with LDL cholesterol goal below 70 mg percent and diabetes with hemoglobin A1c goal below 6.5%. Return for followup in 6 months or earlier if necessary    Note: This document was prepared with digital dictation and possible smart phrase technology. Any transcriptional errors that result from this process are unintentional

## 2014-01-19 ENCOUNTER — Encounter: Payer: Self-pay | Admitting: Cardiovascular Disease

## 2014-01-19 ENCOUNTER — Ambulatory Visit (INDEPENDENT_AMBULATORY_CARE_PROVIDER_SITE_OTHER): Payer: BC Managed Care – PPO | Admitting: Cardiovascular Disease

## 2014-01-19 VITALS — BP 108/62 | HR 63 | Ht 65.0 in | Wt 141.5 lb

## 2014-01-19 DIAGNOSIS — I252 Old myocardial infarction: Secondary | ICD-10-CM

## 2014-01-19 DIAGNOSIS — E785 Hyperlipidemia, unspecified: Secondary | ICD-10-CM

## 2014-01-19 DIAGNOSIS — I6340 Cerebral infarction due to embolism of unspecified cerebral artery: Secondary | ICD-10-CM

## 2014-01-19 DIAGNOSIS — I779 Disorder of arteries and arterioles, unspecified: Secondary | ICD-10-CM

## 2014-01-19 DIAGNOSIS — I639 Cerebral infarction, unspecified: Secondary | ICD-10-CM

## 2014-01-19 DIAGNOSIS — I10 Essential (primary) hypertension: Secondary | ICD-10-CM

## 2014-01-19 DIAGNOSIS — I2510 Atherosclerotic heart disease of native coronary artery without angina pectoris: Secondary | ICD-10-CM

## 2014-01-19 DIAGNOSIS — I739 Peripheral vascular disease, unspecified: Secondary | ICD-10-CM

## 2014-01-19 DIAGNOSIS — I25118 Atherosclerotic heart disease of native coronary artery with other forms of angina pectoris: Secondary | ICD-10-CM | POA: Insufficient documentation

## 2014-01-19 MED ORDER — VARENICLINE TARTRATE 1 MG PO TABS: 1.0000 mg | ORAL_TABLET | Freq: Two times a day (BID) | ORAL | Status: DC

## 2014-01-19 NOTE — Assessment & Plan Note (Addendum)
She does have mild residual deficits, still having difficulty with driving, perception, reactions speed

## 2014-01-19 NOTE — Assessment & Plan Note (Signed)
Blood pressure is well controlled on today's visit. No changes made to the medications. 

## 2014-01-19 NOTE — Assessment & Plan Note (Addendum)
Cardiac catheterization in May 2014 showing occluded mid RCA (subtotally occluded). Shortness of breath since that time with stenting 2 weeks ago at the end of February 2014 to the distal RCA now with improved shortness of breath. Interestingly the mid RCA was not occluded but did have severe stenosis on catheterization 2 weeks ago. She does have a large distal bifurcating RCA. Final result after catheterization showed good distal perfusion. She does have residual 50-60% mid RCA disease.  We have recommended that she call our office if shortness of breath symptoms recur. We'll continue current medications. No changes made to today's regimen

## 2014-01-19 NOTE — Patient Instructions (Signed)
You are doing well.  Please start chantix 1/2 pill titrating up to a whole pill twice a day for smoking cessation  Please call us if you have new issues that need to be addressed before your next appt.  Your physician wants you to follow-up in: 6 months.  You will receive a reminder letter in the mail two months in advance. If you don't receive a letter, please call our office to schedule the follow-up appointment.

## 2014-01-19 NOTE — Assessment & Plan Note (Signed)
Prior inferior wall MI by catheterization May 2014 with occluded mid to distal RCA.  RCA patent in followup catheterization February 2015, now patent after distal RCA stent placed February 2015

## 2014-01-19 NOTE — Progress Notes (Signed)
Patient ID: [name], female    DOB: [birth year], 58 y.o.   MRN: [id number]  HPI Comments: [name] is a 58 year old woman with long history of smoking who stopped in May 2014, coronary artery disease with non-ST elevation in may 2015 with stent placed to her distal RCA, stroke October 2014 evaluated at [facility]. She presents to establish care.  She had a recent hospitalization 12/31/2013 where she presented with chest pain, angina. He had a Coreg catheterization with distal RCA stent placed. Also started on ranexa, continued on isosorbide. Since then she has had dramatic improvement in her shortness of breath which she had had since May 2014  Cardiac catheterization report 01/01/2014 details 95% distal RCA lesion with stent placed. Also had 40% mid LAD, 30% distal LAD, 80% D1 disease, 60% D2 disease, 50% ostial RCA disease, 50% proximal RCA disease, 60% mid RCA disease stent placed was a xience  EX 2.5 millimeter stent  Cardiac catheterization in 03/16/2013 details occluded mid RCA with collaterals from distal LAD also 75% stenosis diagonal #1 near the ostium Ejection fraction at that time was 46% notes also detailed 40% mid LAD, 30% distal LAD, 50% D2 disease, 30% proximal circumflex, 75% proximal RCA disease  Echocardiogram 03/16/2013 with ejection fraction 30-35%, moderate to severe mitral valve regurgitation, moderate tricuspid valve regurgitation .   Repeat echocardiogram October 2014 following her stroke showed relatively normal ejection fraction estimated at least greater than 55% with no significant valvular regurgitation  Lab work 01/01/2012 showing LDL 92, HDL 41, total cholesterol 199, hemoglobin A1c 6.4  EKG today shows normal sinus rhythm with rate 63 beats per minute with T wave abnormality through the anterior precordial leads     Outpatient Encounter Prescriptions as of 01/19/2014  Medication Sig  . ALPRAZolam (XANAX) 1 MG tablet Take 1 mg by mouth at bedtime as  needed for sleep.  Marland Kitchen aspirin 81 MG tablet Take 81 mg by mouth daily.  Marland Kitchen atorvastatin (LIPITOR) 40 MG tablet Take 1 tablet (40 mg total) by mouth daily at 6 PM.  . clopidogrel (PLAVIX) 75 MG tablet Take 1 tablet (75 mg total) by mouth daily.  Marland Kitchen FLUoxetine (PROZAC) 40 MG capsule Take 40 mg by mouth daily.  . isosorbide mononitrate (IMDUR) 60 MG 24 hr tablet Take 60 mg by mouth daily.  Marland Kitchen LORazepam (ATIVAN) 0.5 MG tablet Take 0.5 mg by mouth every 4 (four) hours as needed for anxiety.  . metoprolol succinate (TOPROL-XL) 25 MG 24 hr tablet Take 12.5 mg by mouth every morning.  . nitrofurantoin (MACRODANTIN) 100 MG capsule Take 100 mg by mouth at bedtime.  . nitroGLYCERIN (NITROSTAT) 0.4 MG SL tablet Place 0.4 mg under the tongue every 5 (five) minutes as needed for chest pain.  . pantoprazole (PROTONIX) 40 MG tablet Take 40 mg by mouth every 12 (twelve) hours.  . ranolazine (RANEXA) 500 MG 12 hr tablet Take 500 mg by mouth 2 (two) times daily.  Marland Kitchen triamterene-hydrochlorothiazide (DYAZIDE) 37.5-25 MG per capsule Take 1 capsule by mouth daily.  Marland Kitchen zolpidem (AMBIEN) 5 MG tablet Take 5 mg by mouth at bedtime as needed for sleep.  . varenicline (CHANTIX CONTINUING MONTH PAK) 1 MG tablet Take 1 tablet (1 mg total) by mouth 2 (two) times daily.    Review of Systems  Constitutional: Negative.   HENT: Negative.   Eyes: Negative.   Respiratory: Negative.   Cardiovascular: Negative.   Gastrointestinal: Negative.   Endocrine: Negative.   Musculoskeletal: Negative.  Skin: Negative.   Allergic/Immunologic: Negative.   Neurological: Negative.   Hematological: Negative.   Psychiatric/Behavioral: Negative.   All other systems reviewed and are negative.    BP 108/62  Pulse 63  Ht 5\' 5"  (1.651 m)  Wt 141 lb 8 oz (64.184 kg)  BMI 23.55 kg/m2  Physical Exam  Nursing note and vitals reviewed. Constitutional: She is oriented to person, place, and time. She appears well-developed and well-nourished.   HENT:  Head: Normocephalic.  Nose: Nose normal.  Mouth/Throat: Oropharynx is clear and moist.  Eyes: Conjunctivae are normal. Pupils are equal, round, and reactive to light.  Neck: Normal range of motion. Neck supple. No JVD present.  Cardiovascular: Normal rate, regular rhythm, S1 normal, S2 normal, normal heart sounds and intact distal pulses.  Exam reveals no gallop and no friction rub.   No murmur heard. Pulmonary/Chest: Effort normal and breath sounds normal. No respiratory distress. She has no wheezes. She has no rales. She exhibits no tenderness.  Abdominal: Soft. Bowel sounds are normal. She exhibits no distension. There is no tenderness.  Musculoskeletal: Normal range of motion. She exhibits no edema and no tenderness.  Lymphadenopathy:    She has no cervical adenopathy.  Neurological: She is alert and oriented to person, place, and time. Coordination normal.  Skin: Skin is warm and dry. No rash noted. No erythema.  Psychiatric: She has a normal mood and affect. Her behavior is normal. Judgment and thought content normal.    Assessment and Plan

## 2014-01-19 NOTE — Assessment & Plan Note (Signed)
Mild bilateral carotid arterial disease. Workup done October 2014 after stroke 

## 2014-01-19 NOTE — Assessment & Plan Note (Signed)
Cholesterol continues to be above goal based on hospital numbers every 2015. Cholesterol that time 190. Goal less than 150. She reports that she has lab work scheduled the end of this month. If cholesterol continues to run high, may need to increase Lipitor up to 80 mg daily. Might need to add zetia 10 mg daily.

## 2014-01-31 LAB — HM PAP SMEAR: HM PAP: NEGATIVE

## 2014-03-16 DIAGNOSIS — G459 Transient cerebral ischemic attack, unspecified: Secondary | ICD-10-CM

## 2014-03-16 DIAGNOSIS — F3289 Other specified depressive episodes: Secondary | ICD-10-CM

## 2014-03-16 DIAGNOSIS — F329 Major depressive disorder, single episode, unspecified: Secondary | ICD-10-CM

## 2014-03-27 ENCOUNTER — Telehealth: Payer: Self-pay | Admitting: *Deleted

## 2014-03-27 NOTE — Telephone Encounter (Signed)
Request from [employer] , sent to [place] on 03/27/14 . Mailed [employer] payment for and release of information to the patient's home address.

## 2014-04-20 DIAGNOSIS — F329 Major depressive disorder, single episode, unspecified: Secondary | ICD-10-CM

## 2014-04-20 DIAGNOSIS — R413 Other amnesia: Secondary | ICD-10-CM

## 2014-04-20 DIAGNOSIS — G459 Transient cerebral ischemic attack, unspecified: Secondary | ICD-10-CM

## 2014-04-20 DIAGNOSIS — F3289 Other specified depressive episodes: Secondary | ICD-10-CM

## 2014-04-27 DIAGNOSIS — R413 Other amnesia: Secondary | ICD-10-CM

## 2014-04-27 DIAGNOSIS — F329 Major depressive disorder, single episode, unspecified: Secondary | ICD-10-CM

## 2014-04-27 DIAGNOSIS — F3289 Other specified depressive episodes: Secondary | ICD-10-CM

## 2014-04-27 DIAGNOSIS — G459 Transient cerebral ischemic attack, unspecified: Secondary | ICD-10-CM

## 2014-05-14 ENCOUNTER — Telehealth: Payer: Self-pay

## 2014-05-14 ENCOUNTER — Observation Stay: Payer: Self-pay

## 2014-05-14 ENCOUNTER — Encounter: Payer: Self-pay | Admitting: Nurse Practitioner

## 2014-05-14 DIAGNOSIS — R0789 Other chest pain: Secondary | ICD-10-CM

## 2014-05-14 DIAGNOSIS — I359 Nonrheumatic aortic valve disorder, unspecified: Secondary | ICD-10-CM

## 2014-05-14 DIAGNOSIS — I10 Essential (primary) hypertension: Secondary | ICD-10-CM

## 2014-05-14 DIAGNOSIS — E785 Hyperlipidemia, unspecified: Secondary | ICD-10-CM

## 2014-05-14 LAB — CK TOTAL AND CKMB (NOT AT ARMC)
CK, TOTAL: 87 U/L
CK, Total: 99 U/L
CK, Total: 87 U/L
CK-MB: 0.5 ng/mL (ref 0.5–3.6)
CK-MB: <0.5 ng/mL — ABNORMAL LOW (ref 0.5–3.6)
CK-MB: <0.5 ng/mL — ABNORMAL LOW (ref 0.5–3.6)

## 2014-05-14 LAB — CBC
HCT: 38.2 % (ref 35.0–47.0)
HGB: 12.8 g/dL (ref 12.0–16.0)
MCH: 32.2 pg (ref 26.0–34.0)
MCHC: 33.4 g/dL (ref 32.0–36.0)
MCV: 96 fL (ref 80–100)
Platelet: 236 10*3/uL (ref 150–440)
RBC: 3.96 10*6/uL (ref 3.80–5.20)
RDW: 12.6 % (ref 11.5–14.5)
WBC: 5.9 10*3/uL (ref 3.6–11.0)

## 2014-05-14 LAB — BASIC METABOLIC PANEL
ANION GAP: 7 (ref 7–16)
BUN: 19 mg/dL — ABNORMAL HIGH (ref 7–18)
CO2: 28 mmol/L (ref 21–32)
CREATININE: 0.91 mg/dL (ref 0.60–1.30)
Calcium, Total: 9.3 mg/dL (ref 8.5–10.1)
Chloride: 103 mmol/L (ref 98–107)
EGFR (Non-African Amer.): >60
Glucose: 144 mg/dL — ABNORMAL HIGH (ref 65–99)
Osmolality: 280 (ref 275–301)
POTASSIUM: 3.7 mmol/L (ref 3.5–5.1)
Sodium: 138 mmol/L (ref 136–145)

## 2014-05-14 LAB — TROPONIN I: Troponin-I: <0.02 ng/mL

## 2014-05-14 LAB — TSH: Thyroid Stimulating Horm: 1.45 u[IU]/mL

## 2014-05-14 LAB — HEMOGLOBIN A1C: HEMOGLOBIN A1C: 5.8 % (ref 4.2–6.3)

## 2014-05-14 NOTE — Telephone Encounter (Signed)
Pt states this weekend it has been hard for her to breathe, states she had a "little chest pain, but not much". Please call.

## 2014-05-14 NOTE — Telephone Encounter (Signed)
Patient called and stated she is having some chest pain  She is having shortness of breath even walking small distances The pain started Thursday she took nitro x 2 and the pain was relieved  Pain is 7 out of 10  Pain is in the middle of her chest radiating under her left arm and around her back  She describes pain as sever pressure  She states that it "feels like my previous heart attacks"   I advised patient to have someone take her to the ED right now or call EMS  Patient verbalized understanding  Dr. Rockey Situ is aware

## 2014-05-15 DIAGNOSIS — R0789 Other chest pain: Secondary | ICD-10-CM

## 2014-05-15 LAB — COMPREHENSIVE METABOLIC PANEL
AST: 17 U/L (ref 15–37)
Albumin: 3.5 g/dL (ref 3.4–5.0)
Alkaline Phosphatase: 82 U/L
Anion Gap: 7 (ref 7–16)
BUN: 17 mg/dL (ref 7–18)
Bilirubin,Total: 0.4 mg/dL (ref 0.2–1.0)
CALCIUM: 8.5 mg/dL (ref 8.5–10.1)
CO2: 29 mmol/L (ref 21–32)
Chloride: 104 mmol/L (ref 98–107)
Creatinine: 0.99 mg/dL (ref 0.60–1.30)
GLUCOSE: 94 mg/dL (ref 65–99)
OSMOLALITY: 281 (ref 275–301)
POTASSIUM: 3.7 mmol/L (ref 3.5–5.1)
SGPT (ALT): 27 U/L (ref 12–78)
SODIUM: 140 mmol/L (ref 136–145)
Total Protein: 7 g/dL (ref 6.4–8.2)

## 2014-05-15 LAB — CBC WITH DIFFERENTIAL/PLATELET
Basophil #: 0.1 10*3/uL (ref 0.0–0.1)
Basophil %: 0.8 %
EOS ABS: 0.4 10*3/uL (ref 0.0–0.7)
EOS PCT: 5.6 %
HCT: 34.2 % — ABNORMAL LOW (ref 35.0–47.0)
HGB: 12 g/dL (ref 12.0–16.0)
LYMPHS PCT: 28 %
Lymphocyte #: 1.8 10*3/uL (ref 1.0–3.6)
MCH: 33.4 pg (ref 26.0–34.0)
MCHC: 35 g/dL (ref 32.0–36.0)
MCV: 96 fL (ref 80–100)
MONO ABS: 0.8 x10 3/mm (ref 0.2–0.9)
Monocyte %: 13 %
NEUTROS ABS: 3.4 10*3/uL (ref 1.4–6.5)
Neutrophil %: 52.6 %
Platelet: 209 10*3/uL (ref 150–440)
RBC: 3.58 10*6/uL — ABNORMAL LOW (ref 3.80–5.20)
RDW: 12.7 % (ref 11.5–14.5)
WBC: 6.5 10*3/uL (ref 3.6–11.0)

## 2014-05-15 LAB — LIPID PANEL
CHOLESTEROL: 192 mg/dL (ref 0–200)
HDL: 51 mg/dL (ref 40–60)
LDL CHOLESTEROL, CALC: 110 mg/dL — AB (ref 0–100)
TRIGLYCERIDES: 155 mg/dL (ref 0–200)
VLDL Cholesterol, Calc: 31 mg/dL (ref 5–40)

## 2014-05-15 NOTE — Telephone Encounter (Signed)
Seen in the hospital yesterday and today, stress test negative Atypical chest pain

## 2014-05-16 ENCOUNTER — Telehealth: Payer: Self-pay

## 2014-05-16 NOTE — Telephone Encounter (Signed)
Patient contacted regarding discharge from [facility] on 05/15/14.  Patient understands to follow up with Ignacia Bayley, NP on 06/05/14 at 1:15 at [facility]. Patient understands discharge instructions? yes Patient understands medications and regiment? yes Patient understands to bring all medications to this visit? yes

## 2014-05-28 ENCOUNTER — Encounter: Payer: Self-pay | Admitting: *Deleted

## 2014-06-05 ENCOUNTER — Encounter: Payer: Self-pay | Admitting: Nurse Practitioner

## 2014-06-05 ENCOUNTER — Ambulatory Visit (INDEPENDENT_AMBULATORY_CARE_PROVIDER_SITE_OTHER): Payer: BC Managed Care – PPO | Admitting: Nurse Practitioner

## 2014-06-05 VITALS — BP 102/64 | HR 69 | Ht 63.0 in | Wt 138.2 lb

## 2014-06-05 DIAGNOSIS — I2510 Atherosclerotic heart disease of native coronary artery without angina pectoris: Secondary | ICD-10-CM

## 2014-06-05 DIAGNOSIS — R079 Chest pain, unspecified: Secondary | ICD-10-CM

## 2014-06-05 DIAGNOSIS — E785 Hyperlipidemia, unspecified: Secondary | ICD-10-CM

## 2014-06-05 DIAGNOSIS — R0789 Other chest pain: Secondary | ICD-10-CM

## 2014-06-05 DIAGNOSIS — I10 Essential (primary) hypertension: Secondary | ICD-10-CM

## 2014-06-05 NOTE — Progress Notes (Signed)
Patient Name: [name] Date of Encounter: 06/05/2014  Primary Care Provider:  Adrian Prows, MD Primary Cardiologist:  Jerilynn Mages. Fletcher Anon, MD   Patient Profile  58 y/o female with a h/o CAD s/p RCA stenting in 12/2013 with recent hospitalization for atypical c/p, who presents for f/u.  Problem List   Past Medical History  Diagnosis Date  . Hypertension   . Hypercholesteremia   . CAD (coronary artery disease)     a. 03/2013 NSTEMI/Cath: 100 RCA, EF 45%->Med Rx;  b. 12/2013 Cath/PCI: LAD 56m, 30d, D1 80, D2 60, LCX nl, RCA 50ost/p, 44m, 95d (2.5x33 Xience DES);  c. 05/2014 Lexi MV: EF 68%, no ischemia.  Marland Kitchen CVA (cerebral vascular accident)     a. 08/2013.  . Tobacco abuse   . Anxiety   . Depression   . Migraines   . Ischemic cardiomyopathy     a. 03/2013 Echo: EF 30-35%, mod dil LA, mod-sev MR, mod TR;  b. 08/2013 Echo EF 60-65%, mild AI.  Marland Kitchen Syncope and collapse   . Carotid disease, bilateral     a. 08/2013 Carotid U/S: 1-39% bilat ICA stenosis.   Past Surgical History  Procedure Laterality Date  . Oophorectomy    . Cesarean section    . Cesarean section with bilateral tubal ligation    . Knee arthroscopy  11/27/2006    left knee   . Cardiac catheterization  01/01/2014  . Coronary angioplasty with stent placement  01/01/2014    95% lesion with a drug eluting stent to the distal RCA.  . Cardiac catheterization  03/16/2013  . Cardiac catheterization  12/2013    Allergies  Allergies  Allergen Reactions  . Codeine Sulfate   . Penicillins Nausea And Vomiting    HPI  58 y/o female with the above problem list.  She was recently admitted to [facility] with a 5 day h/o constant chest pain.  Ss were worse with deep breathing and better with palpation over her chest wall.  Despite prolonged Ss, she ruled out for MI and ECG was non-acute.  She underwent stress testing (lexi) and this was negative for ischemia w/ nl LV fxn.  She was discharged home following her stress test.  Following d/c,  she continued to have low level c/p w/o associated Ss for about a week.  This did not necessarily limit her activities and subsequently resolved spontaneously.  Since then, she's had a few episodes of resting c/p, one time awaking her from sleep and was worse when lying on her left side.  Ss can last hours at a time.  Ss have never occurred with exertion and do not limit her activity in any way.  She has taken 1 dose of prn ibuprofen and did not get much relief afterwards.  She denies palpitations, dyspnea, pnd, orthopnea, n, v, dizziness, syncope, edema, weight gain, or early satiety.   Home Medications  Prior to Admission medications   Medication Sig Start Date End Date Taking? Authorizing Provider  ALPRAZolam Duanne Moron) 1 MG tablet Take 1 mg by mouth at bedtime as needed for sleep.   Yes Historical Provider, MD  aspirin 81 MG tablet Take 81 mg by mouth daily.   Yes Historical Provider, MD  atorvastatin (LIPITOR) 40 MG tablet Take 1 tablet (40 mg total) by mouth daily at 6 PM. 08/11/13  Yes Frazier Richards, MD  clopidogrel (PLAVIX) 75 MG tablet Take 1 tablet (75 mg total) by mouth daily. 11/15/13  Yes Antony Contras, MD  FLUoxetine (PROZAC) 40 MG capsule Take 40 mg by mouth daily.   Yes Historical Provider, MD  isosorbide mononitrate (IMDUR) 60 MG 24 hr tablet Take 60 mg by mouth daily.   Yes Historical Provider, MD  LORazepam (ATIVAN) 0.5 MG tablet Take 0.5 mg by mouth every 4 (four) hours as needed for anxiety.   Yes Historical Provider, MD  metoprolol succinate (TOPROL-XL) 25 MG 24 hr tablet Take 12.5 mg by mouth every morning.   Yes Historical Provider, MD  nitrofurantoin (MACRODANTIN) 100 MG capsule Take 100 mg by mouth at bedtime.   Yes Historical Provider, MD  nitroGLYCERIN (NITROSTAT) 0.4 MG SL tablet Place 0.4 mg under the tongue every 5 (five) minutes as needed for chest pain.   Yes Historical Provider, MD  pantoprazole (PROTONIX) 40 MG tablet Take 40 mg by mouth every 12 (twelve) hours.   Yes  Historical Provider, MD  ranolazine (RANEXA) 500 MG 12 hr tablet Take 500 mg by mouth 2 (two) times daily.   Yes Historical Provider, MD  triamterene-hydrochlorothiazide (DYAZIDE) 37.5-25 MG per capsule Take 1 capsule by mouth daily. 06/27/13  Yes Historical Provider, MD  zolpidem (AMBIEN) 5 MG tablet Take 5 mg by mouth at bedtime as needed for sleep.   Yes Historical Provider, MD    Review of Systems  Intermittent atypical chest pain as above.  All other systems reviewed and are otherwise negative except as noted above.  Physical Exam  Blood pressure 102/64, pulse 69, height 5\' 3"  (1.6 m), weight 138 lb 4 oz (62.71 kg).  General: Pleasant, NAD Psych: Normal affect. Neuro: Alert and oriented X 3. Moves all extremities spontaneously. HEENT: Normal  Neck: Supple without bruits or JVD. Lungs:  Resp regular and unlabored, CTA. Heart: RRR no s3, s4, or murmurs. Abdomen: Soft, non-tender, non-distended, BS + x 4.  Extremities: No clubbing, cyanosis or edema. DP/PT/Radials 2+ and equal bilaterally.  Accessory Clinical Findings  ECG - RSR, 69, diffuse non-specific t changes (flattening and ant twi) - no acute findings.  Assessment & Plan  1.  Atypical Chest Pain/CAD:  Pt does have a h/o CAD s/p stenting of the RCA in February of this year. She was recently admitted with prolonged chest pain w/o objective evidence of ischemia and with nl myoview.  Since her d/c, she has had occasional recurrences of midsternal c/p that is somewhat worse with deep breathing and improved with palpation.  Ss have never occurred with exertion and do not limit her activities.  Cont current cardiac meds, which include, asa, statin, plavix, bb, nitrate, and ranexa.  No further ischemic w/u @ this time.  She is also on BID PPI and prn ibuprofen, which I advised she take sparingly given ongoing asa/plavix.  2.  HTN:  Stable on bb, triamterene/hctz.  3.  HL:  Cont statin.  4.  H/o tobacco abuse:  Quit 2014.  5.   Dispo:  F/u with Dr. Fletcher Anon in 6 mos or sooner if necessary.  Murray Hodgkins, NP 06/05/2014, 2:00 PM

## 2014-06-05 NOTE — Patient Instructions (Addendum)
Your physician wants you to follow-up in: 6 months with Dr. Arida You will receive a reminder letter in the mail two months in advance. If you don't receive a letter, please call our office to schedule the follow-up appointment.  

## 2014-07-11 ENCOUNTER — Telehealth: Payer: Self-pay

## 2014-07-11 NOTE — Telephone Encounter (Signed)
Request from [employer] , sent to [employer] on 07/12/2014. Also sent release information to pt.

## 2014-07-18 ENCOUNTER — Encounter: Payer: Self-pay | Admitting: Cardiovascular Disease

## 2014-07-18 ENCOUNTER — Ambulatory Visit (INDEPENDENT_AMBULATORY_CARE_PROVIDER_SITE_OTHER): Payer: BC Managed Care – PPO | Admitting: Cardiovascular Disease

## 2014-07-18 VITALS — BP 112/80 | HR 80 | Ht 62.5 in | Wt 137.5 lb

## 2014-07-18 DIAGNOSIS — R42 Dizziness and giddiness: Secondary | ICD-10-CM

## 2014-07-18 DIAGNOSIS — R0602 Shortness of breath: Secondary | ICD-10-CM

## 2014-07-18 DIAGNOSIS — I10 Essential (primary) hypertension: Secondary | ICD-10-CM

## 2014-07-18 DIAGNOSIS — I639 Cerebral infarction, unspecified: Secondary | ICD-10-CM

## 2014-07-18 DIAGNOSIS — R079 Chest pain, unspecified: Secondary | ICD-10-CM

## 2014-07-18 DIAGNOSIS — E785 Hyperlipidemia, unspecified: Secondary | ICD-10-CM

## 2014-07-18 DIAGNOSIS — Z8673 Personal history of transient ischemic attack (TIA), and cerebral infarction without residual deficits: Secondary | ICD-10-CM

## 2014-07-18 DIAGNOSIS — I2510 Atherosclerotic heart disease of native coronary artery without angina pectoris: Secondary | ICD-10-CM

## 2014-07-18 DIAGNOSIS — M25532 Pain in left wrist: Secondary | ICD-10-CM

## 2014-07-18 DIAGNOSIS — I6340 Cerebral infarction due to embolism of unspecified cerebral artery: Secondary | ICD-10-CM

## 2014-07-18 DIAGNOSIS — M25531 Pain in right wrist: Secondary | ICD-10-CM | POA: Insufficient documentation

## 2014-07-18 DIAGNOSIS — M25539 Pain in unspecified wrist: Secondary | ICD-10-CM

## 2014-07-18 MED ORDER — MECLIZINE HCL 25 MG PO TABS: 25.0000 mg | ORAL_TABLET | Freq: Three times a day (TID) | ORAL | Status: DC | PRN

## 2014-07-18 MED ORDER — ATORVASTATIN CALCIUM 40 MG PO TABS: 40.0000 mg | ORAL_TABLET | Freq: Every day | ORAL | Status: DC

## 2014-07-18 MED ORDER — PROMETHAZINE HCL 25 MG PO TABS
25.0000 mg | ORAL_TABLET | Freq: Four times a day (QID) | ORAL | Status: DC | PRN
Start: 2014-07-18 — End: 2015-02-19

## 2014-07-18 NOTE — Patient Instructions (Addendum)
You are doing well. Please restart the atorvastatin one a day for cholesterol  Please take meclizine up to three times a day for dizziness/spinning Take phenergen as needed for nausea up to three times a day  Please call us if you have new issues that need to be addressed before your next appt.  Your physician wants you to follow-up in: 6 months.  You will receive a reminder letter in the mail two months in advance. If you don't receive a letter, please call our office to schedule the follow-up appointment.   [facility] [address], [place], [place] [address] [phone]

## 2014-07-18 NOTE — Assessment & Plan Note (Signed)
Recommended that she wear wrist splints at nighttime when she sleeps If symptoms persist, could be a sign of carpal tunnel syndrome

## 2014-07-18 NOTE — Assessment & Plan Note (Signed)
Recommend that she stay on her aspirin and Plavix

## 2014-07-18 NOTE — Assessment & Plan Note (Signed)
Blood pressure is well controlled on today's visit. No changes made to the medications. 

## 2014-07-18 NOTE — Progress Notes (Signed)
Patient ID: [name], female    DOB: 1956-06-26, 58 y.o.   MRN: [id number]  HPI Comments: [name] is a 58 year old woman with long history of smoking who stopped in May 2014, coronary artery disease with non-ST elevation in may 2015 with stent placed to her distal RCA, stroke October 2014 evaluated at [facility]. She presents for routine followup   hospitalization 12/31/2013 where she presented with angina. He had a catheterization with distal RCA stent placed.  Also started on ranexa, continued on isosorbide.   In followup today, she denies any significant shortness of breath. She has been having 2 weeks of periodic dizziness with nausea, worse when she moves her head. She keeps her head perfectly still, symptoms are manageable. She is having significant vomiting when symptoms are severe. Most of her symptoms associated with various movements of her head She has not been taking her cholesterol medication for uncertain reasons. She is willing to restart She does report having bilateral wrist pain in the mornings when she wakes up Also has bruising on her legs  Previous Cardiac catheterization report 01/01/2014 details 95% distal RCA lesion with stent placed. Also had 40% mid LAD, 30% distal LAD, 80% D1 disease, 60% D2 disease, 50% ostial RCA disease, 50% proximal RCA disease, 60% mid RCA disease stent placed was a xience  EX 2.5 millimeter stent  Cardiac catheterization in 03/16/2013 details occluded mid RCA with collaterals from distal LAD also 75% stenosis diagonal #1 near the ostium Ejection fraction at that time was 46% notes also detailed 40% mid LAD, 30% distal LAD, 50% D2 disease, 30% proximal circumflex, 75% proximal RCA disease  Echocardiogram 03/16/2013 with ejection fraction 30-35%, moderate to severe mitral valve regurgitation, moderate tricuspid valve regurgitation .  echocardiogram October 2014 following her stroke showed relatively normal ejection fraction estimated at  least greater than 55% with no significant valvular regurgitation  EKG today shows normal sinus rhythm with rate 80 beats per minute with T wave abnormality through the anterior precordial leads    Outpatient Encounter Prescriptions as of 07/18/2014  Medication Sig  . aspirin 81 MG tablet Take 81 mg by mouth daily.  Marland Kitchen atorvastatin (LIPITOR) 40 MG tablet Take 1 tablet (40 mg total) by mouth daily at 6 PM. She has not been taking her Lipitor   . clopidogrel (PLAVIX) 75 MG tablet Take 1 tablet (75 mg total) by mouth daily.  . DULoxetine (CYMBALTA) 60 MG capsule Take 60 mg by mouth daily.  . isosorbide mononitrate (IMDUR) 60 MG 24 hr tablet Take 60 mg by mouth daily.  . metoprolol succinate (TOPROL-XL) 25 MG 24 hr tablet Take 12.5 mg by mouth every morning.  . nitrofurantoin (MACRODANTIN) 100 MG capsule Take 100 mg by mouth at bedtime.  . nitroGLYCERIN (NITROSTAT) 0.4 MG SL tablet Place 0.4 mg under the tongue every 5 (five) minutes as needed for chest pain.  . pantoprazole (PROTONIX) 40 MG tablet Take 40 mg by mouth every 12 (twelve) hours.  . ranolazine (RANEXA) 500 MG 12 hr tablet Take 500 mg by mouth 2 (two) times daily.  . sertraline (ZOLOFT) 50 MG tablet Take 50 mg by mouth daily.  . traZODone (DESYREL) 50 MG tablet Take 50 mg by mouth at bedtime.  . triamterene-hydrochlorothiazide (DYAZIDE) 37.5-25 MG per capsule Take 1 capsule by mouth daily.   Review of Systems  Constitutional: Negative.   HENT: Negative.   Eyes: Negative.   Respiratory: Negative.   Cardiovascular: Negative.   Gastrointestinal: Negative.  Endocrine: Negative.   Musculoskeletal: Negative.   Skin: Negative.   Allergic/Immunologic: Negative.   Neurological: Positive for dizziness.       Spinning, nausea, vomiting  Hematological: Negative.   Psychiatric/Behavioral: Negative.   All other systems reviewed and are negative.   BP 112/80  Pulse 80  Ht 5' 2.5" (1.588 m)  Wt 137 lb 8 oz (62.37 kg)  BMI 24.73  kg/m2  Physical Exam  Nursing note and vitals reviewed. Constitutional: She is oriented to person, place, and time. She appears well-developed and well-nourished.  HENT:  Head: Normocephalic.  Nose: Nose normal.  Mouth/Throat: Oropharynx is clear and moist.  Eyes: Conjunctivae are normal. Pupils are equal, round, and reactive to light.  Neck: Normal range of motion. Neck supple. No JVD present.  Cardiovascular: Normal rate, regular rhythm, S1 normal, S2 normal, normal heart sounds and intact distal pulses.  Exam reveals no gallop and no friction rub.   No murmur heard. Pulmonary/Chest: Effort normal and breath sounds normal. No respiratory distress. She has no wheezes. She has no rales. She exhibits no tenderness.  Abdominal: Soft. Bowel sounds are normal. She exhibits no distension. There is no tenderness.  Musculoskeletal: Normal range of motion. She exhibits no edema and no tenderness.  Lymphadenopathy:    She has no cervical adenopathy.  Neurological: She is alert and oriented to person, place, and time. Coordination normal.  Skin: Skin is warm and dry. No rash noted. No erythema.  Psychiatric: She has a normal mood and affect. Her behavior is normal. Judgment and thought content normal.    Assessment and Plan

## 2014-07-18 NOTE — Assessment & Plan Note (Signed)
Currently with no symptoms of angina. No further workup at this time. Continue current medication regimen. 

## 2014-07-18 NOTE — Assessment & Plan Note (Signed)
Suggested she restart her cholesterol medication, Lipitor 40 mg daily   goal LDL less than 70

## 2014-07-18 NOTE — Assessment & Plan Note (Signed)
We have recommended she try meclizine for vertigo with Phenergan as needed for nausea If no improvement of her symptoms, she could talk about further management with neurology or ear nose throat

## 2014-07-18 NOTE — Assessment & Plan Note (Signed)
Still with residual vision deficits. She has followup with neurology next week

## 2014-07-19 ENCOUNTER — Ambulatory Visit: Payer: BC Managed Care – PPO | Admitting: Neurology

## 2014-07-24 ENCOUNTER — Encounter: Payer: Self-pay | Admitting: Neurology

## 2014-07-24 ENCOUNTER — Ambulatory Visit (INDEPENDENT_AMBULATORY_CARE_PROVIDER_SITE_OTHER): Payer: BC Managed Care – PPO | Admitting: Neurology

## 2014-07-24 VITALS — BP 108/74 | HR 89 | Wt 135.8 lb

## 2014-07-24 DIAGNOSIS — I639 Cerebral infarction, unspecified: Secondary | ICD-10-CM

## 2014-07-24 DIAGNOSIS — I6350 Cerebral infarction due to unspecified occlusion or stenosis of unspecified cerebral artery: Secondary | ICD-10-CM

## 2014-07-24 NOTE — Patient Instructions (Signed)
I had a long discussion with the patient regarding her risk for future strokes and need for aggressive risk factor modification and complimented her on smoking cessation. Continue aspirin and Plavix for stroke prevention given history of drug coated cardiac stents. Strict control of hypertension with blood pressure goal below 130/90 and lipids with LDL cholesterol goal below 100 mg percent. Check a carotid ultrasound study. Return for followup in the future in one year for call earlier if necessary.

## 2014-07-24 NOTE — Progress Notes (Signed)
[facility] [address] [place]. [place] [address] [phone]       OFFICE FOLLOW-UP NOTE  [name] Date of Birth:  1956/07/19 Medical Record Number:  [id number]   HPI: 65 year Caucasian lady  seen for first office followup visit after finishing enrollment in the POINT trial  in January. She initially presented to [facility] on 10/2/4 and right-sided weakness, dizziness. She was transferred from her workplace to urgent care where they noticed some  EKG changes and sent her to [facility] where she noticed decreased sensation and strength in the right arm and leg. She had some decreased sensation on the right face as well. Her NIH stroke scale was 2 she was not given TPA because she was out of the time window and symptoms were mild. She was enrolled in the POINT trial of aspirin plus placebo versus aspirin and Plavix for secondary stroke prevention. MRI scan of the brain did not reveal any acute left brain infarct but did show silent right basal ganglia infarct. MRA of the brain did not show large vessel stenosis. Carotid ultrasound showed no significant extracranial stenosis. Transthoracic echo showed normal ejection fraction. Urine drug screen was negative. Hemoglobin A1c was 5.8. Cholesterol was significant only for LDL over 104 mg percent. She was started honk point study medication In discharge home and did well. She finished enrollment in the study and when seen on the 90 day followup said that measures 0. She however has had some memory and cognitive difficulties which she states are persisted. She has long-standing history of depression and is on Prozac but feels her symptoms are still significant. She wants to refer to neuropsychologist. Her primary care physician did increase Prozac to 40 mg a day but she has not noted any obvious benefit. She was found to have 90% restenosis of a cardiac stent and underwent another cardiac stent in [place] 2 weeks  ago. She however is not happy with her cardiologist and plans to change to her new cardiologist Dr. Candis Musa soon. She is currently on medical leave till June 5 due to her cardiac condition. Update 07/24/14 : She returns for followup last visit 6 months ago. She continues to do well from a neurovascular standpoint without recurrent stroke or TIA symptoms. She has successfully quit smoking. She states her blood pressure is quite well controlled and today it is 108/7 for an office. She is tolerating Lipitor well without side effects. She remains on aspirin and Plavix. She is currently seeing a psychiatrist in [place] Dr. Jake Michaelis who has changed her Prozac to Cymbalta and Zoloft but she feels her depression is not much better. She complains of some mild right eye pain as well as pressure sensation of the right for head and temples but this is not bothersome. ROS:   14 system review of systems is positive for high pain, light sensitivity, blurred vision, excessive thirst, nausea, diarrhea, headache, dizziness, may loss, joint pain, confusion, depression, nervousness and all of the systems negative.  PMH:  Past Medical History  Diagnosis Date  . Hypertension   . Hypercholesteremia   . CAD (coronary artery disease)     a. 03/2013 NSTEMI/Cath: 100 RCA, EF 45%->Med Rx;  b. 12/2013 Cath/PCI: LAD 61m, 30d, D1 80, D2 60, LCX nl, RCA 50ost/p, 71m, 95d (2.5x33 Xience DES);  c. 05/2014 Lexi MV: EF 68%, no ischemia.  Marland Kitchen CVA (cerebral vascular accident)     a. 08/2013.  . Tobacco abuse   .  Anxiety   . Depression   . Migraines   . Ischemic cardiomyopathy     a. 03/2013 Echo: EF 30-35%, mod dil LA, mod-sev MR, mod TR;  b. 08/2013 Echo EF 60-65%, mild AI.  Marland Kitchen Syncope and collapse   . Carotid disease, bilateral     a. 08/2013 Carotid U/S: 1-39% bilat ICA stenosis.    Social History:  History   Social History  . Marital Status: Married    Spouse Name: N/A    Number of Children: 1  . Years of Education: N/A    Occupational History  . lab tech    Social History Main Topics  . Smoking status: Former Smoker -- 0.25 packs/day for 20 years    Types: Cigarettes    Quit date: 04/07/2013  . Smokeless tobacco: Not on file  . Alcohol Use: No  . Drug Use: No  . Sexual Activity: Yes   Other Topics Concern  . Not on file   Social History Narrative  . No narrative on file    Medications:   Current Outpatient Prescriptions on File Prior to Visit  Medication Sig Dispense Refill  . aspirin 81 MG tablet Take 81 mg by mouth daily.      Marland Kitchen atorvastatin (LIPITOR) 40 MG tablet Take 1 tablet (40 mg total) by mouth daily at 6 PM.  90 tablet  3  . clopidogrel (PLAVIX) 75 MG tablet Take 1 tablet (75 mg total) by mouth daily.  30 tablet  11  . DULoxetine (CYMBALTA) 60 MG capsule Take 60 mg by mouth daily.      . isosorbide mononitrate (IMDUR) 60 MG 24 hr tablet Take 60 mg by mouth daily.      . meclizine (ANTIVERT) 25 MG tablet Take 1 tablet (25 mg total) by mouth 3 (three) times daily as needed.  90 tablet  1  . metoprolol succinate (TOPROL-XL) 25 MG 24 hr tablet Take 12.5 mg by mouth every morning.      . nitrofurantoin (MACRODANTIN) 100 MG capsule Take 100 mg by mouth at bedtime.      . nitroGLYCERIN (NITROSTAT) 0.4 MG SL tablet Place 0.4 mg under the tongue every 5 (five) minutes as needed for chest pain.      . pantoprazole (PROTONIX) 40 MG tablet Take 40 mg by mouth every 12 (twelve) hours.      . promethazine (PHENERGAN) 25 MG tablet Take 1 tablet (25 mg total) by mouth every 6 (six) hours as needed for nausea or vomiting.  90 tablet  1  . ranolazine (RANEXA) 500 MG 12 hr tablet Take 500 mg by mouth 2 (two) times daily.      . sertraline (ZOLOFT) 50 MG tablet Take 50 mg by mouth daily.      . traZODone (DESYREL) 50 MG tablet Take 50 mg by mouth at bedtime.      . triamterene-hydrochlorothiazide (DYAZIDE) 37.5-25 MG per capsule Take 1 capsule by mouth daily.       No current facility-administered  medications on file prior to visit.    Allergies:   Allergies  Allergen Reactions  . Codeine Sulfate   . Penicillins Nausea And Vomiting    Physical Exam General: well developed, well nourished middle aged Caucasian lady, seated, in no evident distress Head: head normocephalic and atraumatic. Orohparynx benign Neck: supple with no carotid or supraclavicular bruits Cardiovascular: regular rate and rhythm, no murmurs Musculoskeletal: no deformity Skin:  no rash/petichiae Vascular:  Normal pulses all extremities. No  scalp tenderness. Superficial temporal artery pulsations are well felt bilaterally. Filed Vitals:   07/24/14 1542  BP: 108/74  Pulse: 89   Neurologic Exam Mental Status: Awake and fully alert. Oriented to place and time. Recent and remote memory intact. Attention span, concentration and fund of knowledge appropriate. Mood and affect appropriate. Mini-Mental status exam not done  Cranial Nerves: Fundoscopic exam not done  . Pupils equal, briskly reactive to light. Extraocular movements full without nystagmus. Visual fields full to confrontation. Hearing intact. Facial sensation intact. Face, tongue, palate moves normally and symmetrically.  Motor: Normal bulk and tone. Normal strength in all tested extremity muscles. Sensory.: intact to touch and pinprick and vibratory sensation.  Coordination: Rapid alternating movements normal in all extremities. Finger-to-nose and heel-to-shin performed accurately bilaterally. Gait and Station: Arises from chair without difficulty. Stance is normal. Gait demonstrates normal stride length and balance . Able to heel, toe and tandem walk without difficulty.  Reflexes: 1+ and symmetric. Toes downgoing.    ASSESSMENT: 14 year patient left brain TIA and a silent right internal capsule infarct secondary to small vessel disease in October 2014 who participated in the POINT trial. She is doing well from neurovascularl standpoint but has memory loss  and cognitive difficulties likely related to suboptimally treated depression.    PLAN: I had a long discussion with the patient regarding her risk for future strokes and need for aggressive risk factor modification and complimented her on smoking cessation. Continue aspirin and Plavix for stroke prevention given history of drug coated cardiac stents. Strict control of hypertension with blood pressure goal below 130/90 and lipids with LDL cholesterol goal below 100 mg percent. Check a carotid ultrasound study. Return for followup in the future in one year for call earlier if necessary.    Note: This document was prepared with digital dictation and possible smart phrase technology. Any transcriptional errors that result from this process are unintentional

## 2014-08-01 ENCOUNTER — Ambulatory Visit (INDEPENDENT_AMBULATORY_CARE_PROVIDER_SITE_OTHER): Payer: BC Managed Care – PPO

## 2014-08-01 ENCOUNTER — Telehealth: Payer: Self-pay

## 2014-08-01 DIAGNOSIS — I6350 Cerebral infarction due to unspecified occlusion or stenosis of unspecified cerebral artery: Secondary | ICD-10-CM

## 2014-08-01 DIAGNOSIS — I639 Cerebral infarction, unspecified: Secondary | ICD-10-CM

## 2014-08-01 NOTE — Telephone Encounter (Signed)
Request from [employer] , sent to [employer] on 08/01/2014 . Pt release form also mailed out.

## 2014-09-21 ENCOUNTER — Telehealth: Payer: Self-pay | Admitting: *Deleted

## 2014-09-21 NOTE — Telephone Encounter (Signed)
I called and notified the patient that I had accidentally mailed out a copy of her results to another patient due to it being stapled together by a previous employee.  I apologized for my mistake and she was very understanding.  I notified the patient of his US Carotid Duplex Results per Christophe Louis, MSN-GNP, [facility] and told her that I would mail her the results immediately.

## 2014-11-06 ENCOUNTER — Telehealth: Payer: Self-pay | Admitting: Cardiovascular Disease

## 2014-11-06 ENCOUNTER — Other Ambulatory Visit: Payer: Self-pay | Admitting: *Deleted

## 2014-11-06 MED ORDER — RANOLAZINE ER 500 MG PO TB12: 500.0000 mg | ORAL_TABLET | Freq: Two times a day (BID) | ORAL | Status: DC

## 2014-11-06 NOTE — Telephone Encounter (Signed)
1. Which medications need to be refilled? renexa  2. Which pharmacy is medication to be sent to?   [facility] [address] [place]   3. Do they need a 30 day or 90 day supply?  If we can do 90 day it would be great  4. Would they like a call back once the medication has been sent to the pharmacy?  Yes

## 2014-11-23 ENCOUNTER — Other Ambulatory Visit: Payer: Self-pay | Admitting: Neurology

## 2014-12-03 ENCOUNTER — Ambulatory Visit (INDEPENDENT_AMBULATORY_CARE_PROVIDER_SITE_OTHER): Payer: BLUE CROSS/BLUE SHIELD | Admitting: Cardiovascular Disease

## 2014-12-03 ENCOUNTER — Encounter: Payer: Self-pay | Admitting: Cardiovascular Disease

## 2014-12-03 VITALS — BP 126/84 | HR 84 | Ht 63.0 in | Wt 138.5 lb

## 2014-12-03 DIAGNOSIS — R079 Chest pain, unspecified: Secondary | ICD-10-CM

## 2014-12-03 DIAGNOSIS — E785 Hyperlipidemia, unspecified: Secondary | ICD-10-CM

## 2014-12-03 DIAGNOSIS — I2510 Atherosclerotic heart disease of native coronary artery without angina pectoris: Secondary | ICD-10-CM

## 2014-12-03 DIAGNOSIS — I10 Essential (primary) hypertension: Secondary | ICD-10-CM

## 2014-12-03 NOTE — Patient Instructions (Signed)
Continue same medications.   Follow up with Dr. Rockey Situ in 6 months. (cancel the appointment in March).

## 2014-12-03 NOTE — Progress Notes (Signed)
HPI  [name] is a 59 year old woman with long history of smoking who stopped in May 2014, coronary artery disease with non-ST elevation in may 2015 with stent placed to her distal RCA, stroke October 2014 evaluated at [facility]. She presents for routine followup   hospitalization 12/31/2013 where she presented with angina. She had a catheterization with distal RCA stent placed.  Also started on ranexa, continued on isosorbide.   Previous Cardiac catheterization report 01/01/2014 details 95% distal RCA lesion with stent placed. Also had 40% mid LAD, 30% distal LAD, 80% D1 disease, 60% D2 disease, 50% ostial RCA disease, 50% proximal RCA disease, 60% mid RCA disease stent placed was a xience  EX 2.5 millimeter stent  Cardiac catheterization in 03/16/2013 details occluded mid RCA with collaterals from distal LAD also 75% stenosis diagonal #1 near the ostium Ejection fraction at that time was 46% notes also detailed 40% mid LAD, 30% distal LAD, 50% D2 disease, 30% proximal circumflex, 75% proximal RCA disease  Echocardiogram 03/16/2013 with ejection fraction 30-35%, moderate to severe mitral valve regurgitation, moderate tricuspid valve regurgitation .  echocardiogram October 2014 following her stroke showed relatively normal ejection fraction estimated at least greater than 55% with no significant valvular regurgitation  She was hospitalized in July at [facility] for atypical chest pain. She had a pharmacologic nuclear stress test which was normal. She had an episode of chest pain one week ago which responded to one nitroglycerin. No further episodes since then.    Allergies  Allergen Reactions  . Codeine Sulfate   . Penicillins Nausea And Vomiting     Current Outpatient Prescriptions on File Prior to Visit  Medication Sig Dispense Refill  . aspirin 81 MG tablet Take 81 mg by mouth daily.    Marland Kitchen atorvastatin (LIPITOR) 40 MG tablet Take 1 tablet (40 mg total) by mouth daily at 6 PM. 90  tablet 3  . clopidogrel (PLAVIX) 75 MG tablet TAKE 1 TABLET BY MOUTH DAILY 90 tablet 2  . DULoxetine (CYMBALTA) 60 MG capsule Take 60 mg by mouth daily.    . isosorbide mononitrate (IMDUR) 60 MG 24 hr tablet Take 60 mg by mouth daily.    . meclizine (ANTIVERT) 25 MG tablet Take 1 tablet (25 mg total) by mouth 3 (three) times daily as needed. 90 tablet 1  . metoprolol succinate (TOPROL-XL) 25 MG 24 hr tablet Take 12.5 mg by mouth every morning.    . nitrofurantoin (MACRODANTIN) 100 MG capsule Take 100 mg by mouth at bedtime.    . nitroGLYCERIN (NITROSTAT) 0.4 MG SL tablet Place 0.4 mg under the tongue every 5 (five) minutes as needed for chest pain.    . pantoprazole (PROTONIX) 40 MG tablet Take 40 mg by mouth every 12 (twelve) hours.    . promethazine (PHENERGAN) 25 MG tablet Take 1 tablet (25 mg total) by mouth every 6 (six) hours as needed for nausea or vomiting. 90 tablet 1  . ranolazine (RANEXA) 500 MG 12 hr tablet Take 1 tablet (500 mg total) by mouth 2 (two) times daily. 180 tablet 3  . traZODone (DESYREL) 50 MG tablet Take 50 mg by mouth at bedtime.    . triamterene-hydrochlorothiazide (DYAZIDE) 37.5-25 MG per capsule Take 1 capsule by mouth daily.     No current facility-administered medications on file prior to visit.     Past Medical History  Diagnosis Date  . Hypertension   . Hypercholesteremia   . CAD (coronary artery disease)  a. 03/2013 NSTEMI/Cath: 100 RCA, EF 45%->Med Rx;  b. 12/2013 Cath/PCI: LAD 29m, 30d, D1 80, D2 60, LCX nl, RCA 50ost/p, 43m, 95d (2.5x33 Xience DES);  c. 05/2014 Lexi MV: EF 68%, no ischemia.  Marland Kitchen CVA (cerebral vascular accident)     a. 08/2013.  . Tobacco abuse   . Anxiety   . Depression   . Migraines   . Ischemic cardiomyopathy     a. 03/2013 Echo: EF 30-35%, mod dil LA, mod-sev MR, mod TR;  b. 08/2013 Echo EF 60-65%, mild AI.  Marland Kitchen Syncope and collapse   . Carotid disease, bilateral     a. 08/2013 Carotid U/S: 1-39% bilat ICA stenosis.     Past  Surgical History  Procedure Laterality Date  . Oophorectomy    . Cesarean section    . Cesarean section with bilateral tubal ligation    . Knee arthroscopy  11/27/2006    left knee   . Cardiac catheterization  01/01/2014  . Coronary angioplasty with stent placement  01/01/2014    95% lesion with a drug eluting stent to the distal RCA.  . Cardiac catheterization  03/16/2013  . Cardiac catheterization  12/2013     Family History  Problem Relation Age of Onset  . Hypertension Mother   . Stroke Mother   . Hyperlipidemia Mother      History   Social History  . Marital Status: Married    Spouse Name: N/A    Number of Children: 1  . Years of Education: N/A   Occupational History  . lab tech    Social History Main Topics  . Smoking status: Former Smoker -- 0.25 packs/day for 20 years    Types: Cigarettes    Quit date: 04/07/2013  . Smokeless tobacco: Not on file  . Alcohol Use: No  . Drug Use: No  . Sexual Activity: Yes   Other Topics Concern  . Not on file   Social History Narrative       PHYSICAL EXAM   BP 126/84 mmHg  Pulse 84  Ht 5\' 3"  (1.6 m)  Wt 138 lb 8 oz (62.823 kg)  BMI 24.54 kg/m2 Constitutional: She is oriented to person, place, and time. She appears well-developed and well-nourished. No distress.  HENT: No nasal discharge.  Head: Normocephalic and atraumatic.  Eyes: Pupils are equal and round. No discharge.  Neck: Normal range of motion. Neck supple. No JVD present. No thyromegaly present.  Cardiovascular: Normal rate, regular rhythm, normal heart sounds. Exam reveals no gallop and no friction rub. No murmur heard.  Pulmonary/Chest: Effort normal and breath sounds normal. No stridor. No respiratory distress. She has no wheezes. She has no rales. She exhibits no tenderness.  Abdominal: Soft. Bowel sounds are normal. She exhibits no distension. There is no tenderness. There is no rebound and no guarding.  Musculoskeletal: Normal range of motion.  She exhibits no edema and no tenderness.  Neurological: She is alert and oriented to person, place, and time. Coordination normal.  Skin: Skin is warm and dry. No rash noted. She is not diaphoretic. No erythema. No pallor.  Psychiatric: She has a normal mood and affect. Her behavior is normal. Judgment and thought content normal.     WGY:[id number]  Rhythm  -  Diffuse nonspecific T-abnormality.   Low voltage with rightward P-axis and rotation -possible pulmonary disease.   ABNORMAL     ASSESSMENT AND PLAN

## 2014-12-07 NOTE — Assessment & Plan Note (Signed)
The patient is doing reasonably well. She had only one episode of chest pain about one week ago that responded to nitroglycerin with no further episodes since then. EKG does not show any acute ischemic changes. She did have recurrent chest pain post PCI with evaluation in the past with cardiac catheterization and nuclear stress test. Thus, I don't recommend further evaluation at the present time. Continue medical therapy and follow-up with Dr. Rockey Situ .

## 2014-12-07 NOTE — Assessment & Plan Note (Signed)
Lab Results  Component Value Date   CHOL 203* 08/11/2013   HDL 60 08/11/2013   LDLCALC 104* 08/11/2013   TRIG 196* 08/11/2013   CHOLHDL 3.4 08/11/2013   Continue treatment with atorvastatin. Recommend a target LDL of less than 70.

## 2014-12-07 NOTE — Assessment & Plan Note (Signed)
Blood pressure is well controlled on current medications. 

## 2014-12-27 DIAGNOSIS — Z0289 Encounter for other administrative examinations: Secondary | ICD-10-CM

## 2015-01-14 ENCOUNTER — Ambulatory Visit: Payer: Self-pay | Admitting: Cardiovascular Disease

## 2015-02-15 LAB — HM MAMMOGRAPHY

## 2015-02-17 ENCOUNTER — Emergency Department
Admit: 2015-02-17 | Disposition: A | Discharge status: Other Acute Inpatient Hospital | Payer: Self-pay | Admitting: Emergency Medicine

## 2015-02-17 LAB — COMPREHENSIVE METABOLIC PANEL
ALBUMIN: 4 g/dL
Alkaline Phosphatase: 79 U/L
Anion Gap: 10 (ref 7–16)
BILIRUBIN TOTAL: 0.4 mg/dL
BUN: 22 mg/dL — ABNORMAL HIGH
CO2: 27 mmol/L
CREATININE: 0.85 mg/dL
Calcium, Total: 9.3 mg/dL
Chloride: 101 mmol/L
Glucose: 120 mg/dL — ABNORMAL HIGH
Potassium: 2.8 mmol/L — ABNORMAL LOW
SGOT(AST): 22 U/L
SGPT (ALT): 23 U/L
Sodium: 138 mmol/L
TOTAL PROTEIN: 7.1 g/dL

## 2015-02-17 LAB — PROTIME-INR
INR: 0.9
PROTHROMBIN TIME: 12.3 s

## 2015-02-17 LAB — CBC WITH DIFFERENTIAL/PLATELET
Basophil #: 0.1 10*3/uL (ref 0.0–0.1)
Basophil %: 0.6 %
EOS ABS: 0.4 10*3/uL (ref 0.0–0.7)
Eosinophil %: 3.5 %
HCT: 36.9 % (ref 35.0–47.0)
HGB: 12.4 g/dL (ref 12.0–16.0)
Lymphocyte #: 2.3 10*3/uL (ref 1.0–3.6)
Lymphocyte %: 20.2 %
MCH: 31.4 pg (ref 26.0–34.0)
MCHC: 33.5 g/dL (ref 32.0–36.0)
MCV: 94 fL (ref 80–100)
Monocyte #: 1 x10 3/mm — ABNORMAL HIGH (ref 0.2–0.9)
Monocyte %: 8.7 %
NEUTROS ABS: 7.5 10*3/uL — AB (ref 1.4–6.5)
NEUTROS PCT: 67 %
Platelet: 319 10*3/uL (ref 150–440)
RBC: 3.94 10*6/uL (ref 3.80–5.20)
RDW: 13.8 % (ref 11.5–14.5)
WBC: 11.3 10*3/uL — AB (ref 3.6–11.0)

## 2015-02-17 LAB — APTT: Activated PTT: 24.2 secs (ref 23.6–35.9)

## 2015-02-17 LAB — TROPONIN I: Troponin-I: <0.03 ng/mL

## 2015-02-18 ENCOUNTER — Encounter (HOSPITAL_COMMUNITY): Payer: Self-pay | Admitting: *Deleted

## 2015-02-18 ENCOUNTER — Inpatient Hospital Stay (HOSPITAL_COMMUNITY): Payer: BLUE CROSS/BLUE SHIELD

## 2015-02-18 ENCOUNTER — Inpatient Hospital Stay (HOSPITAL_COMMUNITY)
Admission: EM | Admit: 2015-02-18 | Discharge: 2015-02-19 | DRG: 312 | Disposition: A | Discharge status: Home / Self Care | Payer: BLUE CROSS/BLUE SHIELD | Source: Other Acute Inpatient Hospital | Attending: Internal Medicine | Admitting: Internal Medicine

## 2015-02-18 DIAGNOSIS — E785 Hyperlipidemia, unspecified: Secondary | ICD-10-CM | POA: Diagnosis present

## 2015-02-18 DIAGNOSIS — F329 Major depressive disorder, single episode, unspecified: Secondary | ICD-10-CM | POA: Diagnosis present

## 2015-02-18 DIAGNOSIS — R471 Dysarthria and anarthria: Secondary | ICD-10-CM | POA: Diagnosis present

## 2015-02-18 DIAGNOSIS — R0989 Other specified symptoms and signs involving the circulatory and respiratory systems: Secondary | ICD-10-CM

## 2015-02-18 DIAGNOSIS — T50905A Adverse effect of unspecified drugs, medicaments and biological substances, initial encounter: Secondary | ICD-10-CM | POA: Diagnosis present

## 2015-02-18 DIAGNOSIS — R531 Weakness: Secondary | ICD-10-CM | POA: Diagnosis present

## 2015-02-18 DIAGNOSIS — Z95818 Presence of other cardiac implants and grafts: Secondary | ICD-10-CM | POA: Diagnosis not present

## 2015-02-18 DIAGNOSIS — R29810 Facial weakness: Secondary | ICD-10-CM | POA: Diagnosis present

## 2015-02-18 DIAGNOSIS — Z885 Allergy status to narcotic agent status: Secondary | ICD-10-CM | POA: Diagnosis not present

## 2015-02-18 DIAGNOSIS — I951 Orthostatic hypotension: Secondary | ICD-10-CM

## 2015-02-18 DIAGNOSIS — Z7982 Long term (current) use of aspirin: Secondary | ICD-10-CM

## 2015-02-18 DIAGNOSIS — I639 Cerebral infarction, unspecified: Secondary | ICD-10-CM | POA: Diagnosis not present

## 2015-02-18 DIAGNOSIS — I2510 Atherosclerotic heart disease of native coronary artery without angina pectoris: Secondary | ICD-10-CM | POA: Diagnosis present

## 2015-02-18 DIAGNOSIS — E860 Dehydration: Secondary | ICD-10-CM

## 2015-02-18 DIAGNOSIS — Z880 Allergy status to penicillin: Secondary | ICD-10-CM | POA: Diagnosis not present

## 2015-02-18 DIAGNOSIS — I6789 Other cerebrovascular disease: Secondary | ICD-10-CM | POA: Diagnosis not present

## 2015-02-18 DIAGNOSIS — Z720 Tobacco use: Secondary | ICD-10-CM

## 2015-02-18 DIAGNOSIS — M6289 Other specified disorders of muscle: Secondary | ICD-10-CM | POA: Diagnosis not present

## 2015-02-18 DIAGNOSIS — F32A Depression, unspecified: Secondary | ICD-10-CM | POA: Diagnosis present

## 2015-02-18 DIAGNOSIS — F419 Anxiety disorder, unspecified: Secondary | ICD-10-CM | POA: Diagnosis present

## 2015-02-18 DIAGNOSIS — Z87891 Personal history of nicotine dependence: Secondary | ICD-10-CM | POA: Diagnosis not present

## 2015-02-18 DIAGNOSIS — I10 Essential (primary) hypertension: Secondary | ICD-10-CM | POA: Diagnosis present

## 2015-02-18 DIAGNOSIS — Z7902 Long term (current) use of antithrombotics/antiplatelets: Secondary | ICD-10-CM | POA: Diagnosis not present

## 2015-02-18 DIAGNOSIS — Z79899 Other long term (current) drug therapy: Secondary | ICD-10-CM | POA: Diagnosis not present

## 2015-02-18 DIAGNOSIS — I25118 Atherosclerotic heart disease of native coronary artery with other forms of angina pectoris: Secondary | ICD-10-CM | POA: Diagnosis present

## 2015-02-18 DIAGNOSIS — I252 Old myocardial infarction: Secondary | ICD-10-CM

## 2015-02-18 DIAGNOSIS — E782 Mixed hyperlipidemia: Secondary | ICD-10-CM | POA: Diagnosis present

## 2015-02-18 DIAGNOSIS — K219 Gastro-esophageal reflux disease without esophagitis: Secondary | ICD-10-CM | POA: Diagnosis present

## 2015-02-18 DIAGNOSIS — E876 Hypokalemia: Secondary | ICD-10-CM

## 2015-02-18 LAB — CBC
HCT: 36.2 % (ref 36.0–46.0)
Hemoglobin: 12.1 g/dL (ref 12.0–15.0)
MCH: 31.5 pg (ref 26.0–34.0)
MCHC: 33.4 g/dL (ref 30.0–36.0)
MCV: 94.3 fL (ref 78.0–100.0)
Platelets: 310 10*3/uL (ref 150–400)
RBC: 3.84 MIL/uL — ABNORMAL LOW (ref 3.87–5.11)
RDW: 14.1 % (ref 11.5–15.5)
WBC: 10.8 10*3/uL — AB (ref 4.0–10.5)

## 2015-02-18 LAB — URINALYSIS, ROUTINE W REFLEX MICROSCOPIC
Bilirubin Urine: NEGATIVE
Glucose, UA: NEGATIVE mg/dL
HGB URINE DIPSTICK: NEGATIVE
Ketones, ur: NEGATIVE mg/dL
Leukocytes, UA: NEGATIVE
Nitrite: NEGATIVE
PROTEIN: NEGATIVE mg/dL
Specific Gravity, Urine: 1.015 (ref 1.005–1.030)
UROBILINOGEN UA: 0.2 mg/dL (ref 0.0–1.0)
pH: 6.5 (ref 5.0–8.0)

## 2015-02-18 LAB — CREATININE, SERUM
CREATININE: 0.88 mg/dL (ref 0.50–1.10)
GFR calc non Af Amer: 72 mL/min — ABNORMAL LOW (ref >90)
GFR, EST AFRICAN AMERICAN: 83 mL/min — AB (ref >90)

## 2015-02-18 LAB — BRAIN NATRIURETIC PEPTIDE: B NATRIURETIC PEPTIDE 5: 15.5 pg/mL (ref 0.0–100.0)

## 2015-02-18 LAB — BASIC METABOLIC PANEL
Anion gap: 10 (ref 5–15)
BUN: 15 mg/dL (ref 6–23)
CALCIUM: 8.9 mg/dL (ref 8.4–10.5)
CHLORIDE: 101 mmol/L (ref 96–112)
CO2: 27 mmol/L (ref 19–32)
Creatinine, Ser: 0.85 mg/dL (ref 0.50–1.10)
GFR calc Af Amer: 86 mL/min — ABNORMAL LOW (ref >90)
GFR calc non Af Amer: 75 mL/min — ABNORMAL LOW (ref >90)
GLUCOSE: 125 mg/dL — AB (ref 70–99)
Potassium: 3.2 mmol/L — ABNORMAL LOW (ref 3.5–5.1)
SODIUM: 138 mmol/L (ref 135–145)

## 2015-02-18 LAB — LIPID PANEL
Cholesterol: 156 mg/dL (ref 0–200)
HDL: 55 mg/dL (ref >39)
LDL CALC: 69 mg/dL (ref 0–99)
Total CHOL/HDL Ratio: 2.8 RATIO
Triglycerides: 162 mg/dL — ABNORMAL HIGH (ref <150)
VLDL: 32 mg/dL (ref 0–40)

## 2015-02-18 LAB — PROTIME-INR
INR: 0.97 (ref 0.00–1.49)
Prothrombin Time: 13 seconds (ref 11.6–15.2)

## 2015-02-18 LAB — RAPID URINE DRUG SCREEN, HOSP PERFORMED
AMPHETAMINES: NOT DETECTED
Barbiturates: NOT DETECTED
Benzodiazepines: NOT DETECTED
Cocaine: NOT DETECTED
OPIATES: NOT DETECTED
Tetrahydrocannabinol: NOT DETECTED

## 2015-02-18 LAB — MAGNESIUM: MAGNESIUM: 2.1 mg/dL (ref 1.5–2.5)

## 2015-02-18 MED ORDER — METOPROLOL SUCCINATE ER 25 MG PO TB24
12.5000 mg | ORAL_TABLET | Freq: Every morning | ORAL | Status: DC
  Filled 2015-02-18 (×2): qty 1

## 2015-02-18 MED ORDER — ZOLPIDEM TARTRATE 5 MG PO TABS: 5.0000 mg | ORAL_TABLET | Freq: Every evening | ORAL | Status: DC | PRN

## 2015-02-18 MED ORDER — NICOTINE 21 MG/24HR TD PT24: 21.0000 mg | MEDICATED_PATCH | Freq: Every day | TRANSDERMAL | Status: DC

## 2015-02-18 MED ORDER — POTASSIUM CHLORIDE 10 MEQ/100ML IV SOLN
10.0000 meq | INTRAVENOUS | Status: AC
  Administered 2015-02-18 (×2): 10 meq via INTRAVENOUS
  Filled 2015-02-18: qty 100

## 2015-02-18 MED ORDER — ACETAMINOPHEN 325 MG PO TABS
650.0000 mg | ORAL_TABLET | Freq: Four times a day (QID) | ORAL | Status: DC | PRN
  Administered 2015-02-18 (×2): 650 mg via ORAL
  Filled 2015-02-18 (×3): qty 2

## 2015-02-18 MED ORDER — HEPARIN SODIUM (PORCINE) 5000 UNIT/ML IJ SOLN
5000.0000 [IU] | Freq: Three times a day (TID) | INTRAMUSCULAR | Status: DC
  Administered 2015-02-18 – 2015-02-19 (×4): 5000 [IU] via SUBCUTANEOUS
  Filled 2015-02-18 (×5): qty 1

## 2015-02-18 MED ORDER — POTASSIUM CHLORIDE 20 MEQ/15ML (10%) PO SOLN
40.0000 meq | Freq: Once | ORAL | Status: AC
  Administered 2015-02-18: 40 meq via ORAL
  Filled 2015-02-18: qty 30

## 2015-02-18 MED ORDER — ASPIRIN EC 81 MG PO TBEC
81.0000 mg | DELAYED_RELEASE_TABLET | Freq: Every day | ORAL | Status: DC
  Administered 2015-02-18 – 2015-02-19 (×2): 81 mg via ORAL
  Filled 2015-02-18 (×4): qty 1

## 2015-02-18 MED ORDER — TRAZODONE HCL 50 MG PO TABS
50.0000 mg | ORAL_TABLET | Freq: Every day | ORAL | Status: DC
  Administered 2015-02-18: 50 mg via ORAL
  Filled 2015-02-18: qty 1

## 2015-02-18 MED ORDER — LORAZEPAM 0.5 MG PO TABS
0.5000 mg | ORAL_TABLET | Freq: Four times a day (QID) | ORAL | Status: DC | PRN
  Administered 2015-02-18: 0.5 mg via ORAL
  Filled 2015-02-18: qty 1

## 2015-02-18 MED ORDER — PANTOPRAZOLE SODIUM 40 MG PO TBEC
40.0000 mg | DELAYED_RELEASE_TABLET | Freq: Two times a day (BID) | ORAL | Status: DC
  Administered 2015-02-18 – 2015-02-19 (×3): 40 mg via ORAL
  Filled 2015-02-18 (×3): qty 1

## 2015-02-18 MED ORDER — ISOSORBIDE MONONITRATE ER 30 MG PO TB24
60.0000 mg | ORAL_TABLET | Freq: Every day | ORAL | Status: DC
  Administered 2015-02-18: 60 mg via ORAL
  Filled 2015-02-18 (×2): qty 2

## 2015-02-18 MED ORDER — SODIUM CHLORIDE 0.9 % IV SOLN
INTRAVENOUS | Status: DC
  Administered 2015-02-18: 04:00:00 via INTRAVENOUS

## 2015-02-18 MED ORDER — SENNOSIDES-DOCUSATE SODIUM 8.6-50 MG PO TABS: 1.0000 | ORAL_TABLET | Freq: Every evening | ORAL | Status: DC | PRN

## 2015-02-18 MED ORDER — NITROGLYCERIN 0.4 MG SL SUBL: 0.4000 mg | SUBLINGUAL_TABLET | SUBLINGUAL | Status: DC | PRN

## 2015-02-18 MED ORDER — POTASSIUM CHLORIDE CRYS ER 20 MEQ PO TBCR
40.0000 meq | EXTENDED_RELEASE_TABLET | Freq: Once | ORAL | Status: AC
  Administered 2015-02-18: 40 meq via ORAL
  Filled 2015-02-18: qty 2

## 2015-02-18 MED ORDER — ISOSORBIDE MONONITRATE ER 30 MG PO TB24
30.0000 mg | ORAL_TABLET | Freq: Every day | ORAL | Status: DC
  Filled 2015-02-18: qty 1

## 2015-02-18 MED ORDER — SERTRALINE HCL 100 MG PO TABS
100.0000 mg | ORAL_TABLET | Freq: Every day | ORAL | Status: DC
  Administered 2015-02-18 – 2015-02-19 (×2): 100 mg via ORAL
  Filled 2015-02-18 (×2): qty 1

## 2015-02-18 MED ORDER — STROKE: EARLY STAGES OF RECOVERY BOOK
Freq: Once | Status: AC
  Administered 2015-02-18: 04:00:00

## 2015-02-18 MED ORDER — CLOPIDOGREL BISULFATE 75 MG PO TABS
75.0000 mg | ORAL_TABLET | Freq: Every day | ORAL | Status: DC
  Administered 2015-02-18 – 2015-02-19 (×2): 75 mg via ORAL
  Filled 2015-02-18 (×2): qty 1

## 2015-02-18 MED ORDER — ATORVASTATIN CALCIUM 40 MG PO TABS
40.0000 mg | ORAL_TABLET | Freq: Every day | ORAL | Status: DC
  Administered 2015-02-18: 40 mg via ORAL
  Filled 2015-02-18: qty 1

## 2015-02-18 MED ORDER — DULOXETINE HCL 60 MG PO CPEP
60.0000 mg | ORAL_CAPSULE | Freq: Every day | ORAL | Status: DC
  Administered 2015-02-18 – 2015-02-19 (×2): 60 mg via ORAL
  Filled 2015-02-18 (×2): qty 1

## 2015-02-18 MED ORDER — ALPRAZOLAM 0.5 MG PO TABS: 1.0000 mg | ORAL_TABLET | Freq: Every evening | ORAL | Status: DC | PRN

## 2015-02-18 MED ORDER — FLUOXETINE HCL 20 MG PO CAPS
40.0000 mg | ORAL_CAPSULE | Freq: Every day | ORAL | Status: DC
  Filled 2015-02-18: qty 2

## 2015-02-18 MED ORDER — RANOLAZINE ER 500 MG PO TB12
500.0000 mg | ORAL_TABLET | Freq: Two times a day (BID) | ORAL | Status: DC
  Administered 2015-02-18 – 2015-02-19 (×3): 500 mg via ORAL
  Filled 2015-02-18 (×3): qty 1

## 2015-02-18 NOTE — Evaluation (Signed)
SLP Cancellation Note  Patient Details Name: PARILEE HALLY MRN: [id number] DOB: 05-23-1956   Cancelled treatment:       Reason Eval/Treat Not Completed: Other (comment) (received order for sle dated 4/12, will conduct as ordered.  thanks)  Luanna Salk, [facility] [phone]

## 2015-02-18 NOTE — Progress Notes (Signed)
[facility] PROGRESS NOTE  [name] OHY:[id number] DOB: 1956-05-24 DOA: 02/18/2015 PCP: Adrian Prows, MD  Brief Summary  [name] is a 59 y.o. female with past medical history of hypertension, hyperlipidemia, GERD, depression, coronary artery disease (post status of stent placement 12/2013), history of stroke on Plavix and aspirin, tobacco abuse, migraine headache, who presented with right-sided weakness, facial droop, slurred speech.  She started having right-sided weakness, right facial droop, and slurred speech at about 9:30 PM.  CT-head was negative for acute abnormalities. She was evaluated by [facility] neurology at [facility] ED, decision made to not push tPA for report NIHSS of 5.    Assessment/Plan  TIA 1. HgbA1c, fasting lipid panel  2. MRI, MRA of the brain without contrast:  No acute abnormality, moderate chronic small vessel ischemic disease, no major intracranial arterial occlusion or significant proximal stenosis, mild right A2 narrowing 3. PT consult, OT consult, Speech consults 4. Echocardiogram pending 5. Carotid dopplers:  1-39% ICA stenosis bilaterally with antegrade vertebral artery flow 6. Prophylactic therapy-continue ASA and Plavix  7. Risk factor modification  8. Tele:  NSR   Hypertension with current hypotension - continue to hold Lasix and hydrochlorothiazide-triameterene  Hyperlipidemia, LDL and HDL at goal -Continue high dose Lipitor  Suspect chronic diastolic heart failure, bilateral rales on exam -  Follow-up echo -  Repeat BMP and if potassium is 3 or greater, will give a dose of IV Lasix  CAD: s/p of stent. No chest pain  - EKG pending -Continue aspirin, Plavix, Imdur, metoprolol, Ranexa, high dose statin  Depression: Stable. No suicidal or homicidal ideations.  -Continued fluoxetine   GERD: stable  -Protonix   Hypokalemia, likely secondary to medications -  Received 60 mEq of potassium -  Repeat potassium -  Check magnesium  Mild  leukocytosis -  UA: Negative -  Chest x-ray: Pending -  Suspect this may be due to her recent steroid  Diet:  Healthy heart Access:  PIV  IVF: Off Proph:  heparin  Code Status: full Family Communication: Patient alone Disposition Plan: Likely home tomorrow, pending echo, PT/OT/speech therapy   Consultants:  Neurology  Procedures:  CT head  MRI/MRA brain  ECHO  Carotid duplex  Antibiotics:  none   HPI/Subjective:  She states that her symptoms have resolved. She still has some persistent mild dysarthria, right upper and right lower extremity weakness which are stable from her previous stroke. She states she continues to have cough which has lingered for several weeks and got a little bit better with the prednisone course 2 weeks ago. She states she thinks she is a course of antibiotics. She denies Current fevers but had a fever 2 weeks ago.  Obje ctiveDanley Danker Vitals:   02/18/15 0809 02/18/15 1000 02/18/15 1144 02/18/15 1149  BP: 101/56 100/59 88/55 108/55  Pulse: 63 73 75   Temp: 98.4 F (36.9 C) 98.6 F (37 C) 98.2 F (36.8 C)   TempSrc: Oral Oral Oral   Resp: 18 18 16    Height:      Weight:      SpO2: 96% 95% 95%     Intake/Output Summary (Last 24 hours) at 02/18/15 1206 Last data filed at 02/18/15 0815  Gross per 24 hour  Intake    240 ml  Output      0 ml  Net    240 ml   Filed Weights   02/18/15 0136  Weight: 63.3 kg (139 lb 8.8 oz)  Exam:   General:   average weight female, No acute distress, subtle dysarthria   HEENT:  NCAT, MMM, very mild right facial droop   Cardiovascular:  RRR, nl S1, S2, 2/6 systolic murmur at the right upper sternal border, 2+ pulses, warm extremities  Respiratory:  Rales at the bilateral bases to the mid back, no wheezes or rhonchi, no increased WOB  Abdomen:   NABS, soft, NT/ND  MSK:   Normal tone and bulk, no LEE  Neuro:  Grossly intact, 4-/5 strength right upper and right lower extremity, sensation  intact to light touch throughout  Data Reviewed: Basic Metabolic Panel:  Recent Labs Lab 02/18/15 0406  CREATININE 0.88   Liver Function Tests: No results for input(s): AST, ALT, ALKPHOS, BILITOT, PROT, ALBUMIN in the last 168 hours. No results for input(s): LIPASE, AMYLASE in the last 168 hours. No results for input(s): AMMONIA in the last 168 hours. CBC:  Recent Labs Lab 02/18/15 0406  WBC 10.8*  HGB 12.1  HCT 36.2  MCV 94.3  PLT 310   Cardiac Enzymes: No results for input(s): CKTOTAL, CKMB, CKMBINDEX, TROPONINI in the last 168 hours. BNP (last 3 results)  Recent Labs  02/18/15 0406  BNP 15.5    ProBNP (last 3 results) No results for input(s): PROBNP in the last 8760 hours.  CBG: No results for input(s): GLUCAP in the last 168 hours.  No results found for this or any previous visit (from the past 240 hour(s)).   Studies: Mr Brain Wo Contrast  02/18/2015   CLINICAL DATA:  Right-sided weakness/facial droop and slurred speech.  EXAM: MRI HEAD WITHOUT CONTRAST  MRA HEAD WITHOUT CONTRAST  TECHNIQUE: Multiplanar, multiecho pulse sequences of the brain and surrounding structures were obtained without intravenous contrast. Angiographic images of the head were obtained using MRA technique without contrast.  COMPARISON:  08/10/2013  FINDINGS: MRI HEAD FINDINGS  There is no evidence of acute infarct, mass, midline shift, or extra-axial fluid collection. Focus of chronic microhemorrhage in the left frontal lobe is unchanged. Ventricles and sulci are within normal limits. Patchy T2 hyperintensities throughout the subcortical and deep cerebral white matter bilaterally are similar to the prior study and nonspecific but compatible with moderate chronic small vessel ischemic disease.  Orbits are unremarkable. There is partial opacification of the lateral recess of the right sphenoid sinus. Mastoid air cells are clear. Major intracranial vascular flow voids are preserved.  MRA HEAD  FINDINGS  The visualized distal vertebral arteries are patent without stenosis. The left vertebral artery is mildly dominant. The left PICA origin is patent. Bilateral AICA and SCA origins are patent. Basilar artery is patent without stenosis. PCAs are unremarkable. Posterior communicating arteries are not identified.  Internal carotid arteries are patent from skullbase to carotid termini without stenosis. ACAs and MCAs are unremarkable aside from mild focal right A2 narrowing. No intracranial aneurysm is identified.  IMPRESSION: 1. No acute intracranial abnormality. 2. Moderate chronic small vessel ischemic disease. 3. No evidence of major intracranial arterial occlusion or significant proximal stenosis. Mild right A2 narrowing.   Electronically Signed   By: Logan Bores   On: 02/18/2015 08:00   Mr Jodene Nam Head/brain Wo Cm  02/18/2015   CLINICAL DATA:  Right-sided weakness/facial droop and slurred speech.  EXAM: MRI HEAD WITHOUT CONTRAST  MRA HEAD WITHOUT CONTRAST  TECHNIQUE: Multiplanar, multiecho pulse sequences of the brain and surrounding structures were obtained without intravenous contrast. Angiographic images of the head were obtained using MRA technique without contrast.  COMPARISON:  08/10/2013  FINDINGS: MRI HEAD FINDINGS  There is no evidence of acute infarct, mass, midline shift, or extra-axial fluid collection. Focus of chronic microhemorrhage in the left frontal lobe is unchanged. Ventricles and sulci are within normal limits. Patchy T2 hyperintensities throughout the subcortical and deep cerebral white matter bilaterally are similar to the prior study and nonspecific but compatible with moderate chronic small vessel ischemic disease.  Orbits are unremarkable. There is partial opacification of the lateral recess of the right sphenoid sinus. Mastoid air cells are clear. Major intracranial vascular flow voids are preserved.  MRA HEAD FINDINGS  The visualized distal vertebral arteries are patent without  stenosis. The left vertebral artery is mildly dominant. The left PICA origin is patent. Bilateral AICA and SCA origins are patent. Basilar artery is patent without stenosis. PCAs are unremarkable. Posterior communicating arteries are not identified.  Internal carotid arteries are patent from skullbase to carotid termini without stenosis. ACAs and MCAs are unremarkable aside from mild focal right A2 narrowing. No intracranial aneurysm is identified.  IMPRESSION: 1. No acute intracranial abnormality. 2. Moderate chronic small vessel ischemic disease. 3. No evidence of major intracranial arterial occlusion or significant proximal stenosis. Mild right A2 narrowing.   Electronically Signed   By: Logan Bores   On: 02/18/2015 08:00    Scheduled Meds: . aspirin EC  81 mg Oral Daily  . atorvastatin  40 mg Oral q1800  . clopidogrel  75 mg Oral Daily  . DULoxetine  60 mg Oral Daily  . heparin  5,000 Units Subcutaneous 3 times per day  . isosorbide mononitrate  60 mg Oral Daily  . metoprolol succinate  12.5 mg Oral q morning - 10a  . pantoprazole  40 mg Oral Q12H  . ranolazine  500 mg Oral BID  . sertraline  100 mg Oral Daily  . traZODone  50 mg Oral QHS   Continuous Infusions:    Principal Problem:   Stroke Active Problems:   Hypertension   Hyperlipidemia   Depression   CAD (coronary artery disease)    Time spent: 30 min    Mykah Shin, [place] [facility] Pager [phone]. If 7PM-7AM, please contact night-coverage at [email or web address], password [facility] 02/18/2015, 12:06 PM  LOS: 0 days

## 2015-02-18 NOTE — Consult Note (Signed)
Stroke Consult    Chief Complaint: right sided weakness and slurred speech  HPI: [name] is an 59 y.o. female with history of prior infarct presenting to [facility] with acute onset of right sided weakness. LSW at 2100, acutely developed right sided weakness, paresthesias and slurred speech. Evaluated by [facility] neurology at [facility] ED, decision made to not push tPA for report NIHSS of 5. Transferred to [facility] for further workup. Reports symptoms persist though slightly improved. Upon my arrival to the room, the patient was returning from the bathroom and ambulating without assistance.  Takes ASA and Plavix at home. Reports a recent URI.   Date last known well: 02/17/2015 Time last known well: 2100 tPA Given: no, deemed symptoms to mild to treat by teleneurology  Past Medical History  Diagnosis Date  . Hypertension   . Hypercholesteremia   . CAD (coronary artery disease)     a. 03/2013 NSTEMI/Cath: 100 RCA, EF 45%->Med Rx;  b. 12/2013 Cath/PCI: LAD 56m, 30d, D1 80, D2 60, LCX nl, RCA 50ost/p, 36m, 95d (2.5x33 Xience DES);  c. 05/2014 Lexi MV: EF 68%, no ischemia.  Marland Kitchen CVA (cerebral vascular accident)     a. 08/2013.  . Tobacco abuse   . Anxiety   . Depression   . Migraines   . Ischemic cardiomyopathy     a. 03/2013 Echo: EF 30-35%, mod dil LA, mod-sev MR, mod TR;  b. 08/2013 Echo EF 60-65%, mild AI.  Marland Kitchen Syncope and collapse   . Carotid disease, bilateral     a. 08/2013 Carotid U/S: 1-39% bilat ICA stenosis.    Past Surgical History  Procedure Laterality Date  . Oophorectomy    . Cesarean section    . Cesarean section with bilateral tubal ligation    . Knee arthroscopy  11/27/2006    left knee   . Cardiac catheterization  01/01/2014  . Coronary angioplasty with stent placement  01/01/2014    95% lesion with a drug eluting stent to the distal RCA.  . Cardiac catheterization  03/16/2013  . Cardiac catheterization  12/2013    Family History  Problem Relation Age of Onset  . Hypertension Mother    . Stroke Mother   . Hyperlipidemia Mother    Social History:  reports that she quit smoking about 22 months ago. Her smoking use included Cigarettes. She has a 5 pack-year smoking history. She does not have any smokeless tobacco history on file. She reports that she does not drink alcohol or use illicit drugs.  Allergies:  Allergies  Allergen Reactions  . Codeine Sulfate   . Penicillins Nausea And Vomiting    Medications Prior to Admission  Medication Sig Dispense Refill  . aspirin 81 MG tablet Take 81 mg by mouth daily.    Marland Kitchen atorvastatin (LIPITOR) 40 MG tablet Take 1 tablet (40 mg total) by mouth daily at 6 PM. 90 tablet 3  . clopidogrel (PLAVIX) 75 MG tablet TAKE 1 TABLET BY MOUTH DAILY 90 tablet 2  . DULoxetine (CYMBALTA) 60 MG capsule Take 60 mg by mouth daily.    . isosorbide mononitrate (IMDUR) 60 MG 24 hr tablet Take 60 mg by mouth daily.    . meclizine (ANTIVERT) 25 MG tablet Take 1 tablet (25 mg total) by mouth 3 (three) times daily as needed. 90 tablet 1  . metoprolol succinate (TOPROL-XL) 25 MG 24 hr tablet Take 12.5 mg by mouth every morning.    . nitrofurantoin (MACRODANTIN) 100 MG capsule Take 100 mg  by mouth at bedtime.    . nitroGLYCERIN (NITROSTAT) 0.4 MG SL tablet Place 0.4 mg under the tongue every 5 (five) minutes as needed for chest pain.    . pantoprazole (PROTONIX) 40 MG tablet Take 40 mg by mouth every 12 (twelve) hours.    . promethazine (PHENERGAN) 25 MG tablet Take 1 tablet (25 mg total) by mouth every 6 (six) hours as needed for nausea or vomiting. 90 tablet 1  . ranolazine (RANEXA) 500 MG 12 hr tablet Take 1 tablet (500 mg total) by mouth 2 (two) times daily. 180 tablet 3  . sertraline (ZOLOFT) 100 MG tablet Take 100 mg by mouth daily.    . traZODone (DESYREL) 50 MG tablet Take 50 mg by mouth at bedtime.    . triamterene-hydrochlorothiazide (DYAZIDE) 37.5-25 MG per capsule Take 1 capsule by mouth daily.      ROS: Out of a complete 14 system review, the  patient complains of only the following symptoms, and all other reviewed systems are negative. +weakness, fatigue  Physical Examination: Filed Vitals:   02/18/15 0136  BP: 105/67  Pulse: 73  Temp: 98.2 F (36.8 C)  Resp: 20   Physical Exam  Constitutional: He appears well-developed and well-nourished.  Psych: Affect appropriate to situation Eyes: No scleral injection HENT: No OP obstrucion Head: Normocephalic.  Cardiovascular: Normal rate and regular rhythm.  Respiratory: Effort normal and breath sounds normal.  GI: Soft. Bowel sounds are normal. No distension. There is no tenderness.  Skin: WDI   Neurologic Examination: Mental Status: Lethargic, blunted affect, oriented, thought content appropriate.  Speech fluent without evidence of aphasia. Minimal dysarthria (though husband reports at baseline) Cranial Nerves: II: optic discs not visualized, visual fields grossly normal, pupils equal, round, reactive to light and accommodation III,IV, VI: ptosis not present, extra-ocular motions intact bilaterally V,VII: smile symmetric, facial light touch sensation normal bilaterally VIII: hearing normal bilaterally IX,X: gag reflex present XI: trapezius strength/neck flexion strength normal bilaterally XII: tongue strength normal  Motor: 5/5 strength in LUE and LLE No drift noted. Appears to have full strength on right side though with questionable effort and giveaway weakness Tone and bulk:normal tone throughout; no atrophy noted Sensory: subject cold sensation on RUE with tough Deep Tendon Reflexes: 3+ and symmetric throughout Plantars: Right: downgoing   Left: downgoing Cerebellar: normal finger-to-nose, and normal heel-to-shin test Gait: walks without assistance  Laboratory Studies:   Basic Metabolic Panel: No results for input(s): NA, K, CL, CO2, GLUCOSE, BUN, CREATININE, CALCIUM, MG, PHOS in the last 168 hours.  Liver Function Tests: No results for input(s): AST,  ALT, ALKPHOS, BILITOT, PROT, ALBUMIN in the last 168 hours. No results for input(s): LIPASE, AMYLASE in the last 168 hours. No results for input(s): AMMONIA in the last 168 hours.  CBC: No results for input(s): WBC, NEUTROABS, HGB, HCT, MCV, PLT in the last 168 hours.  Cardiac Enzymes: No results for input(s): CKTOTAL, CKMB, CKMBINDEX, TROPONINI in the last 168 hours.  BNP: Invalid input(s): POCBNP  CBG: No results for input(s): GLUCAP in the last 168 hours.  Microbiology: No results found for this or any previous visit.  Coagulation Studies: No results for input(s): LABPROT, INR in the last 72 hours.  Urinalysis: No results for input(s): COLORURINE, LABSPEC, PHURINE, GLUCOSEU, HGBUR, BILIRUBINUR, KETONESUR, PROTEINUR, UROBILINOGEN, NITRITE, LEUKOCYTESUR in the last 168 hours.  Invalid input(s): APPERANCEUR  Lipid Panel:     Component Value Date/Time   CHOL 203* 08/11/2013 0612   TRIG 196* 08/11/2013 0612  HDL 60 08/11/2013 0612   CHOLHDL 3.4 08/11/2013 0612   VLDL 39 08/11/2013 0612   LDLCALC 104* 08/11/2013 0612    HgbA1C:  Lab Results  Component Value Date   HGBA1C 5.8* 08/11/2013    Urine Drug Screen:      Component Value Date/Time   LABOPIA NONE DETECTED 08/10/2013 1308   COCAINSCRNUR NONE DETECTED 08/10/2013 1308   LABBENZ NONE DETECTED 08/10/2013 1308   AMPHETMU NONE DETECTED 08/10/2013 1308   THCU NONE DETECTED 08/10/2013 1308   LABBARB NONE DETECTED 08/10/2013 1308    Alcohol Level: No results for input(s): ETH in the last 168 hours.   Imaging: No results found.  Assessment: 59 y.o. female hx of HTN, prior CVA presenting with acute onset of right sided weakness, paresthesias and slurred speech. Exam pertinent for effort dependent weakness on the right side.   Plan: 1. HgbA1c, fasting lipid panel 2. MRI, MRA  of the brain without contrast 3. PT consult, OT consult, Speech consult 4. Echocardiogram 5. Carotid dopplers 6. Prophylactic  therapy-continue ASA and Plavix 7. Risk factor modification 8. Telemetry monitoring 9. Frequent neuro checks 10. NPO until RN stroke swallow screen 11. Check CBC, UA, UDS  Jim Like, DO [facility] [phone]  If 7pm- 7am, please page neurology on call as listed in [place]. 02/18/2015, 1:53 AM

## 2015-02-18 NOTE — H&P (Signed)
[facility] History and Physical  [name] EVO:[id number] DOB: June 16, 1956 DOA: 02/18/2015  Referring physician: ED physician PCP: Adrian Prows, MD  Specialists:   Chief Complaint: Right-sided weakness, facial droop, slurred speech  HPI: [name] is a 59 y.o. female with past medical history of hypertension, hyperlipidemia, GERD, depression, coronary artery disease (post status of stent placement 12/2013), history of stroke on Plavix and aspirin, tobacco abuse, migraine headache, who presents with right-sided weakness, facial droop, slurred speech  Patient reports that she started having right-sided weakness and slurred speech at about 9:30 PM. She was noted to have right facial droop and slurring speech. She was evaluated in [facility] and had CT-head which is negative for acute abnormalities. She was evaluated by [facility] neurology at [facility] ED, decision made to not push tPA for report NIHSS of 5. When I saw patient on the floor, she still has right facial droop. She reports having right arm numbness and mild weakness on the right arm and leg.   Patient denies fever, chills, headaches, cough, chest pain, SOB, abdominal pain, diarrhea, constipation, dysuria, urgency, frequency, hematuria, skin rashes, joint pain. No vision change or hearing loss.  In [facility] ED, she was found to have negative CT head for acute abnormalities, potassium 2.8, negative troponin, INR 0.9, temperature normal, no tachycardia, WBC 11.3, renal function. Patient is admitted to inpatient for further evaluation and treatment.   Review of Systems: As presented in the history of presenting illness, rest negative.  Where does patient live?  At home Can patient participate in ADLs? Yes  Allergy:  Allergies  Allergen Reactions  . Codeine Sulfate   . Penicillins Nausea And Vomiting    Past Medical History  Diagnosis Date  . Hypertension   . Hypercholesteremia   . CAD (coronary artery disease)     a. 03/2013  NSTEMI/Cath: 100 RCA, EF 45%->Med Rx;  b. 12/2013 Cath/PCI: LAD 39m, 30d, D1 80, D2 60, LCX nl, RCA 50ost/p, 61m, 95d (2.5x33 Xience DES);  c. 05/2014 Lexi MV: EF 68%, no ischemia.  Marland Kitchen CVA (cerebral vascular accident)     a. 08/2013.  . Tobacco abuse   . Anxiety   . Depression   . Migraines   . Ischemic cardiomyopathy     a. 03/2013 Echo: EF 30-35%, mod dil LA, mod-sev MR, mod TR;  b. 08/2013 Echo EF 60-65%, mild AI.  Marland Kitchen Syncope and collapse   . Carotid disease, bilateral     a. 08/2013 Carotid U/S: 1-39% bilat ICA stenosis.    Past Surgical History  Procedure Laterality Date  . Oophorectomy    . Cesarean section    . Cesarean section with bilateral tubal ligation    . Knee arthroscopy  11/27/2006    left knee   . Cardiac catheterization  01/01/2014  . Coronary angioplasty with stent placement  01/01/2014    95% lesion with a drug eluting stent to the distal RCA.  . Cardiac catheterization  03/16/2013  . Cardiac catheterization  12/2013    Social History:  reports that she quit smoking about 22 months ago. Her smoking use included Cigarettes. She has a 5 pack-year smoking history. She does not have any smokeless tobacco history on file. She reports that she does not drink alcohol or use illicit drugs.  Family History:  Family History  Problem Relation Age of Onset  . Hypertension Mother   . Stroke Mother   . Hyperlipidemia Mother      Prior to Admission medications  Medication Sig Start Date End Date Taking? Authorizing Provider  aspirin 81 MG tablet Take 81 mg by mouth daily.    Historical Provider, MD  atorvastatin (LIPITOR) 40 MG tablet Take 1 tablet (40 mg total) by mouth daily at 6 PM. 07/18/14   Minna Merritts, MD  clopidogrel (PLAVIX) 75 MG tablet TAKE 1 TABLET BY MOUTH DAILY 11/23/14   Garvin Fila, MD  DULoxetine (CYMBALTA) 60 MG capsule Take 60 mg by mouth daily.    Historical Provider, MD  isosorbide mononitrate (IMDUR) 60 MG 24 hr tablet Take 60 mg by mouth daily.     Historical Provider, MD  meclizine (ANTIVERT) 25 MG tablet Take 1 tablet (25 mg total) by mouth 3 (three) times daily as needed. 07/18/14   Minna Merritts, MD  metoprolol succinate (TOPROL-XL) 25 MG 24 hr tablet Take 12.5 mg by mouth every morning.    Historical Provider, MD  nitrofurantoin (MACRODANTIN) 100 MG capsule Take 100 mg by mouth at bedtime.    Historical Provider, MD  nitroGLYCERIN (NITROSTAT) 0.4 MG SL tablet Place 0.4 mg under the tongue every 5 (five) minutes as needed for chest pain.    Historical Provider, MD  pantoprazole (PROTONIX) 40 MG tablet Take 40 mg by mouth every 12 (twelve) hours.    Historical Provider, MD  promethazine (PHENERGAN) 25 MG tablet Take 1 tablet (25 mg total) by mouth every 6 (six) hours as needed for nausea or vomiting. 07/18/14   Minna Merritts, MD  ranolazine (RANEXA) 500 MG 12 hr tablet Take 1 tablet (500 mg total) by mouth 2 (two) times daily. 11/06/14   Minna Merritts, MD  sertraline (ZOLOFT) 100 MG tablet Take 100 mg by mouth daily.    Historical Provider, MD  traZODone (DESYREL) 50 MG tablet Take 50 mg by mouth at bedtime.    Historical Provider, MD  triamterene-hydrochlorothiazide (DYAZIDE) 37.5-25 MG per capsule Take 1 capsule by mouth daily. 06/27/13   Historical Provider, MD    Physical Exam: Filed Vitals:   02/18/15 0136 02/18/15 0330  BP: 105/67 105/62  Pulse: 73   Temp: 98.2 F (36.8 C) 98.2 F (36.8 C)  TempSrc: Oral Oral  Resp: 20 18  Height: 5\' 2"  (1.575 m)   Weight: 63.3 kg (139 lb 8.8 oz)   SpO2: 97% 96%   General: Not in acute distress HEENT:       Eyes: PERRL, EOMI, no scleral icterus       ENT: No discharge from the ears and nose, no pharynx injection, no tonsillar enlargement.        Neck: No JVD, no bruit, no mass felt. Cardiac: S1/S2, RRR, No murmurs, No gallops or rubs Pulm: there are fine crackles bilaterally and posteriorly. Abd: Soft, nondistended, nontender, no rebound pain, no organomegaly, BS present Ext: No  edema bilaterally. 2+DP/PT pulse bilaterally Musculoskeletal: No joint deformities, erythema, or stiffness, ROM full Skin: No rashes.  Neuro: Alert and oriented X3, cranial nerves II-XII grossly intact except for right facial droop, muscle strength 5/5 in all extremeties, sensation to light touch decreased on the right side. Brachial reflex 1+ bilaterally. Knee reflex 1+ bilaterally. Negative Babinski's sign. Normal finger to nose test. Psych: Patient is not psychotic, no suicidal or hemocidal ideation.  Labs on Admission:  Basic Metabolic Panel: No results for input(s): NA, K, CL, CO2, GLUCOSE, BUN, CREATININE, CALCIUM, MG, PHOS in the last 168 hours. Liver Function Tests: No results for input(s): AST, ALT, ALKPHOS, BILITOT, PROT, ALBUMIN  in the last 168 hours. No results for input(s): LIPASE, AMYLASE in the last 168 hours. No results for input(s): AMMONIA in the last 168 hours. CBC: No results for input(s): WBC, NEUTROABS, HGB, HCT, MCV, PLT in the last 168 hours. Cardiac Enzymes: No results for input(s): CKTOTAL, CKMB, CKMBINDEX, TROPONINI in the last 168 hours.  BNP (last 3 results) No results for input(s): BNP in the last 8760 hours.  ProBNP (last 3 results) No results for input(s): PROBNP in the last 8760 hours.  CBG: No results for input(s): GLUCAP in the last 168 hours.  Radiological Exams on Admission: No results found.  EKG: will get one  Assessment/Plan Principal Problem:   Stroke Active Problems:   Hypertension   Hyperlipidemia   Depression   CAD (coronary artery disease)  Stroke: This is most likely recurrent stroke. Neurology was consulted, Dr. Clarnce Flock pt and recommended full stroke workup.  -will admit to tele bed and follow-up with Dr. Sabino Donovan recommendations as follows: 1. HgbA1c, fasting lipid panel 2. MRI, MRA of the brain without contrast 3. PT consult, OT consult, Speech consult 4. Echocardiogram 5. Carotid dopplers 6. Prophylactic therapy-continue  ASA and Plavix 7. Risk factor modification 8. Telemetry monitoring 9. Frequent neuro checks 10. NPO until RN stroke swallow screen 11. Check CBC, UA, UDS  Hypertension: -hold Lasix and hydrochlorothiazide-triameterene in the setting of acute stroke.  Hyperlipidemia: -Continue Lipitor  CAD: s/p of stent. No chest pain -get EKG -Continue aspirin,, Plavix, Imdur, metoprolol, Ranexa  Depression: Stable. No suicidal or homicidal ideations. -Continue fluoxetine  GERD: -Protonix   DVT ppx: SQ Heparin        Code Status: Full code Family Communication:  Yes, patient's   boyfriend    at bed side Disposition Plan: Admit to inpatient   Date of Service 02/18/2015    Ivor Costa [facility] Pager [phone]  If 7PM-7AM, please contact night-coverage [email or web address] Password [email or web address] 02/18/2015, 5:18 AM

## 2015-02-18 NOTE — Progress Notes (Signed)
Pt admttted from [facility] with symptoms of stroke,pt settled in room, Admitting doc paged for orders, telemetry monitor put on pt,, call light at bedside, pt reassured, will however continue to monitor. Obasogie-Asidi, Marletta Bousquet Efe

## 2015-02-18 NOTE — Progress Notes (Signed)
STROKE TEAM PROGRESS NOTE   HISTORY [name] is an 59 y.o. female with history of prior infarct presenting to [facility] with acute onset of right sided weakness. LKW at 2100 02/17/2015, acutely developed right sided weakness, paresthesias and slurred speech. Evaluated by [facility] neurology at [facility] ED, decision made to not push tPA for report NIHSS of 5, documented pt declined by [facility]. Transferred to [facility] for further workup. Reports symptoms persist though slightly improved. Upon my arrival to the room, the patient was returning from the bathroom and ambulating without assistance. Takes ASA and Plavix at home. Reports a recent URI. She was admitted for further evaluation and treatment.   SUBJECTIVE (INTERVAL HISTORY) Her husband is at the bedside.  Overall she feels her condition is stable. Her husband is concerned with her repetitive symptoms.   OBJECTIVE Temp:  [98.2 F (36.8 C)-98.4 F (36.9 C)] 98.4 F (36.9 C) (04/11 0809) Pulse Rate:  [63-73] 63 (04/11 0809) Cardiac Rhythm:  [-] Normal sinus rhythm (04/11 0530) Resp:  [18-20] 18 (04/11 0530) BP: (99-105)/(54-67) 101/56 mmHg (04/11 0809) SpO2:  [96 %-97 %] 96 % (04/11 0809) Weight:  [63.3 kg (139 lb 8.8 oz)] 63.3 kg (139 lb 8.8 oz) (04/11 0136)  No results for input(s): GLUCAP in the last 168 hours.  Recent Labs Lab 02/18/15 0406  CREATININE 0.88   No results for input(s): AST, ALT, ALKPHOS, BILITOT, PROT, ALBUMIN in the last 168 hours.  Recent Labs Lab 02/18/15 0406  WBC 10.8*  HGB 12.1  HCT 36.2  MCV 94.3  PLT 310   No results for input(s): CKTOTAL, CKMB, CKMBINDEX, TROPONINI in the last 168 hours.  Recent Labs  02/18/15 0406  LABPROT 13.0  INR 0.97    Recent Labs  02/18/15 0410  COLORURINE YELLOW  LABSPEC 1.015  PHURINE 6.5  GLUCOSEU NEGATIVE  HGBUR NEGATIVE  BILIRUBINUR NEGATIVE  KETONESUR NEGATIVE  PROTEINUR NEGATIVE  UROBILINOGEN 0.2  NITRITE NEGATIVE  LEUKOCYTESUR NEGATIVE       Component Value  Date/Time   CHOL 156 02/18/2015 0406   TRIG 162* 02/18/2015 0406   HDL 55 02/18/2015 0406   CHOLHDL 2.8 02/18/2015 0406   VLDL 32 02/18/2015 0406   LDLCALC 69 02/18/2015 0406   Lab Results  Component Value Date   HGBA1C 5.8* 08/11/2013      Component Value Date/Time   LABOPIA NONE DETECTED 02/18/2015 0410   COCAINSCRNUR NONE DETECTED 02/18/2015 0410   LABBENZ NONE DETECTED 02/18/2015 0410   AMPHETMU NONE DETECTED 02/18/2015 0410   THCU NONE DETECTED 02/18/2015 0410   LABBARB NONE DETECTED 02/18/2015 0410    No results for input(s): ETH in the last 168 hours.  I have personally reviewed the radiological images below and agree with the radiology interpretations.  Mr Brain Wo Contrast 02/18/2015    1. No acute intracranial abnormality. 2. Moderate chronic small vessel ischemic disease.   Mr Jodene Nam Head/brain Wo Cm 02/18/2015    3. No evidence of major intracranial arterial occlusion or significant proximal stenosis. Mild right A2 narrowing.     Carotid Doppler  There is 1-39% bilateral ICA stenosis. Vertebral artery flow is antegrade.    2D echo - pending  PHYSICAL EXAM  Temp:  [97.8 F (36.6 C)-98.6 F (37 C)] 98.6 F (37 C) (04/11 2205) Pulse Rate:  [63-82] 72 (04/11 2205) Resp:  [16-20] 20 (04/11 2205) BP: (88-108)/(54-67) 94/57 mmHg (04/11 2205) SpO2:  [94 %-97 %] 94 % (04/11 2205) Weight:  [139 lb 8.8 oz (  63.3 kg)] 139 lb 8.8 oz (63.3 kg) (04/11 0136)  General - Well nourished, well developed, in no apparent distress, but lethargic.  Ophthalmologic - not cooperative on exam.  Cardiovascular - Regular rate and rhythm.  Mental Status -  Level of arousal and orientation to time, place, and person were intact, but lethargic with psychomotor slowing. Language including expression, naming, repetition, comprehension was assessed and found intact.  Cranial Nerves II - XII - II - Visual field intact OU. III, IV, VI - Extraocular movements intact. V - Facial sensation  intact bilaterally. VII - Facial movement intact bilaterally. VIII - Hearing & vestibular intact bilaterally. X - Palate elevates symmetrically. XI - Chin turning & shoulder shrug intact bilaterally. XII - Tongue protrusion intact.  Motor Strength - The patient's strength was normal in all extremities and pronator drift was absent except 4/5 RLE proximal with lack of effort.  Bulk was normal and fasciculations were absent.   Motor Tone - Muscle tone was assessed at the neck and appendages and was normal.  Reflexes - The patient's reflexes were 1+ in all extremities and she had no pathological reflexes.  Sensory - Light touch, temperature/pinprick were assessed and were symmetrical.    Coordination - The patient had normal movements in the hands and feet with no ataxia or dysmetria.  Tremor was absent.  Gait and Station - not tested due to lethargy.   ASSESSMENT/PLAN [name] is a 59 y.o. female with history of hypertension, hyperlipidemia, GERD, depression, coronary artery disease (post status of stent placement 12/2013), history of stroke on Plavix and aspirin, tobacco abuse, migraine headache presenting to [facility] with right-sided weakness, facial droop, slurred speech. She was transferred to [facility] for further evaluation. She did not receive IV t-PA at [facility] as pt declinesd.   Right-sided weakness, facial droop, slurred speech possibly due to low blood pressure and recent URI, no acute stroke, doubt TIA  Resultant  Neurologic deficits resolved except lack of effort on exam  MRI  No acute stroke  MRA  No large vessel stenosis  Carotid Doppler  unremarkable  2D Echo  pending   HgbA1c pending  Heparin 5000 units sq tid for VTE prophylaxis  Diet Heart Room service appropriate?: Yes; Fluid consistency:: Thin  aspirin 81 mg orally every day and clopidogrel 75 mg orally every day prior to admission, now on aspirin 81 mg orally every day and clopidogrel 75 mg  orally every day. Continue dual antiplatelets given cardiac disease.  Ongoing aggressive stroke risk factor management  Therapy recommendations:  pending   Disposition:  Return home  Followed by Dr. Leonie Man as an OP. Next appt scheduled for 07/25/2015. Can consider moving appointment up is desired.  Hx of stroke - 08/2013 - right BG infarcts  Likely small vessel disease  Participated in POINT trial  However, complained of right sided weakness.  Follow up with Dr. Leonie Man in clinic  Hypertension  Home meds:   imdur, metoprolol  Running a little on the low side. BP 88-108/54-67 past 24h (02/18/2015 @ 2:50 PM)  Long-term Blood pressure below 130/90  Hyperlipidemia  Home meds:  Lipitor 40 mg daily, resumed in hospital  LDL 69, goal < 70  Continue statin at discharge  Other Stroke Risk Factors  Cigarette smoker, quit smoking 22 months ago  Family hx stroke (mother at a young age)  Coronary artery disease s/p drug-coated stent 12/2013, NSTEMI 03/2013,   Migraines  Other Active Problems  GERD  Depression -  felt to have been treated suboptimally with resultant memory loss and cognitive difficulties  Hypokalemia, 2.8, replaced   Suspect Chronic diastolic heart failure  Mild leukocytosis. UA negative. Chest x-ray pending. Suspect due to recent steroids.  Hospital day # 0  Radene [facility] [place] for Pager information 02/18/2015 9:58 AM   I, the attending vascular neurologist, have personally obtained a history, examined the patient, evaluated laboratory data, individually viewed imaging studies and agree with radiology interpretations. I also discussed with Dr. Sheran Fava regarding her care plan. Together with the NP/PA, we formulated the assessment and plan of care which reflects our mutual decision.  I have made any additions or clarifications directly to the above note and agree with the findings and plan as currently documented.    60 yo F with  hx of stroke in 2014, HTN, HLD, depression was admitted for lethargy, HA, neck pain and imbalance. Symptoms not typical for stroke and MRI negative. Stroke work up negative. She is on dural antiplatelet for cardiac protection. Found to have low BPs and recent URI. Pt symptoms might due to recrudescence in the setting of low BP and recent illness. Recommend normotensive BP goal. Add lipitor 40. Pt will follow up with Dr. Leonie Man in clinic in about 2 months.  Neurology will sign off. Please call with questions. Pt will follow up with Dr. Leonie Man at [facility] in about 2 months. Thanks for the consult.   Rosalin Hawking, MD PhD Stroke Neurology 02/18/2015 10:53 PM     To contact Stroke Continuity provider, please refer to [email or web address] After hours, contact General Neurology

## 2015-02-18 NOTE — Progress Notes (Signed)
VASCULAR LAB PRELIMINARY  PRELIMINARY  PRELIMINARY  PRELIMINARY  Carotid Dopplers completed.    Preliminary report:  1-39% ICA stenosis.  Vertebral artery flow is antegrade.   Erandy Mceachern, RVT 02/18/2015, 11:12 AM

## 2015-02-19 DIAGNOSIS — E860 Dehydration: Secondary | ICD-10-CM

## 2015-02-19 DIAGNOSIS — I6789 Other cerebrovascular disease: Secondary | ICD-10-CM

## 2015-02-19 DIAGNOSIS — I951 Orthostatic hypotension: Secondary | ICD-10-CM

## 2015-02-19 DIAGNOSIS — M6289 Other specified disorders of muscle: Secondary | ICD-10-CM

## 2015-02-19 DIAGNOSIS — E876 Hypokalemia: Secondary | ICD-10-CM

## 2015-02-19 LAB — CBC
HEMATOCRIT: 34.5 % — AB (ref 36.0–46.0)
Hemoglobin: 11.5 g/dL — ABNORMAL LOW (ref 12.0–15.0)
MCH: 31.3 pg (ref 26.0–34.0)
MCHC: 33.3 g/dL (ref 30.0–36.0)
MCV: 93.8 fL (ref 78.0–100.0)
PLATELETS: 276 10*3/uL (ref 150–400)
RBC: 3.68 MIL/uL — AB (ref 3.87–5.11)
RDW: 14.1 % (ref 11.5–15.5)
WBC: 7.5 10*3/uL (ref 4.0–10.5)

## 2015-02-19 LAB — BASIC METABOLIC PANEL
Anion gap: 6 (ref 5–15)
BUN: 10 mg/dL (ref 6–23)
CHLORIDE: 105 mmol/L (ref 96–112)
CO2: 31 mmol/L (ref 19–32)
Calcium: 9 mg/dL (ref 8.4–10.5)
Creatinine, Ser: 0.82 mg/dL (ref 0.50–1.10)
GFR calc Af Amer: >90 mL/min (ref >90)
GFR calc non Af Amer: 78 mL/min — ABNORMAL LOW (ref >90)
GLUCOSE: 95 mg/dL (ref 70–99)
Potassium: 3.7 mmol/L (ref 3.5–5.1)
SODIUM: 142 mmol/L (ref 135–145)

## 2015-02-19 LAB — HEMOGLOBIN A1C
HEMOGLOBIN A1C: 6 % — AB (ref 4.8–5.6)
MEAN PLASMA GLUCOSE: 126 mg/dL

## 2015-02-19 LAB — TSH: TSH: 1.55 u[IU]/mL (ref 0.350–4.500)

## 2015-02-19 MED ORDER — LEVOFLOXACIN 750 MG PO TABS: 750.0000 mg | ORAL_TABLET | Freq: Every day | ORAL | Status: DC

## 2015-02-19 MED ORDER — LEVOFLOXACIN 750 MG PO TABS
750.0000 mg | ORAL_TABLET | Freq: Every day | ORAL | Status: DC
  Administered 2015-02-19: 750 mg via ORAL
  Filled 2015-02-19: qty 1

## 2015-02-19 NOTE — Discharge Summary (Addendum)
Physician Discharge Summary  [name] NKN:[id number] DOB: 06/28/1956 DOA: 02/18/2015  PCP: Adrian Prows, MD  Admit date: 02/18/2015 Discharge date: 02/19/2015  Recommendations for Outpatient Follow-up:  1. F/u with Dr. Leonie Man at previously scheduled appointment in September or sooner if indicated 2. PCP for f/u of cortisol level and hypertension management in 1 week.  Please repeat CBC to follow up anemia and further work up if not already complete.  Repeat BMP to check potassium and creatinine.  Weight check after stopping diuretics.    Discharge Diagnoses:  Principal Problem:   Orthostatic hypotension Active Problems:   Right sided weakness   Hypertension   Hyperlipidemia   Depression   CAD (coronary artery disease)   Dehydration   Hypokalemia   Discharge Condition: stable, improved  Diet recommendation: healthy heart  Wt Readings from Last 3 Encounters:  02/18/15 63.3 kg (139 lb 8.8 oz)  12/03/14 62.823 kg (138 lb 8 oz)  07/24/14 61.598 kg (135 lb 12.8 oz)    History of present illness:   [name] is a 59 y.o. female with past medical history of hypertension, hyperlipidemia, GERD, depression, coronary artery disease (post status of stent placement 12/2013), history of stroke on Plavix and aspirin, tobacco abuse, migraine headache, who presented with right-sided weakness, facial droop, slurred speech. She started having right-sided weakness, right facial droop, and slurred speech at about 9:30 PM. CT-head was negative for acute abnormalities. She was evaluated by [facility] neurology at [facility] ED, decision made to not push tPA for report NIHSS of 5. She was mildly hypotensive and had moderate to severe hypokalemia with potassium of 2.8.  She was admitted for further work up of possible stroke.  She was hydrated and her electrolytes corrected.   MRI demonstrated no evidence of stroke.   Neurology felt that her symptoms were likely secondary to exacerbation of her previous  stroke from dehydration and orthostatic hypotension.  Her symptoms completely resolved quickly with IVF.    Hospital Course:   Dehydration with orthostatic hypotension resulting in exacerbation of previous stroke symptoms.   1. HgbA1c, fasting lipid panel as below 2. MRI, MRA of the brain without contrast: No acute abnormality, moderate chronic small vessel ischemic disease, no major intracranial arterial occlusion or significant proximal stenosis, mild right A2 narrowing 3. PT/OT/SLP recommding no follow up 4. Echocardiogram:  Normal systolic function with ejection fraction of 55-60%, no regional wall motion abnormalities, grade 1 diastolic dysfunction. She had increased thickness of the atrial septum consistent with lipomatous hypertrophy. Mild aortic valve regurgitation.   5. Carotid dopplers: 1-39% ICA stenosis bilaterally with antegrade vertebral artery flow 6. Prophylactic therapy-continued ASA and Plavix  7. Tele: NSR   Hypertension with current hypotension, likely orthostatic hypotension.  She had been taking triamterene-HCTZ with lasix as needed for swelling and reported frequent orthostatic-like symptoms at home, several times a day for the last several months.  She has had normal blood pressures (110/70) at her PCP's office.   -  She continued her beta blocker -  Recommended that she stop her HCTZ-triamterene and prn lasix since this is likely contributing to her dehydration and her hypokalemia  Hyperlipidemia, LDL and HDL at goal -Continued high dose Lipitor  Chronic diastolic heart failure, bilateral rales on exam but otherwise appears hypovolemic - ECHO:  Demonstrated grade 1 DD, preserved EF with normal systolic function  CAD s/p of stent. No chest pain  -Continued aspirin, Plavix, Imdur, metoprolol, Ranexa, high dose statin  Depression: Stable. No suicidal  or homicidal ideations.  -Continued fluoxetine   GERD: stable  -Continued Protonix   Hypokalemia, likely  secondary to medications and resolved after 60 mEq of potassium.  Magnesium was wnl  Possible CAP with mild leukocytosis - UA: Negative - Chest x-ray: mild bibasilar opacities suggestive of atelectasis but no focal consolidation -  She has bilateral nonclearing course rales on exam   -  SLP did not feel she was aspiration risk - Started levofloxacin for 7 days and recommended she follow up with her PCP  Mild normocytic anemia, likely hemodilutional.  No evidence of bleeding.  Repeat CBC in 1 week by PCP and further work up for anemia if persistent and not already complete.    Consultants:  Neurology  Procedures:  CT head  MRI/MRA brain  ECHO  Carotid duplex  Antibiotics:  none  Discharge Exam: Filed Vitals:   02/19/15 1445  BP: 116/47  Pulse: 80  Temp: 98.1 F (36.7 C)  Resp: 17   Filed Vitals:   02/19/15 0159 02/19/15 0631 02/19/15 1011 02/19/15 1445  BP: 124/69 109/58 126/63 116/47  Pulse: 72 80 83 80  Temp: 98.4 F (36.9 C) 98.2 F (36.8 C) 98 F (36.7 C) 98.1 F (36.7 C)  TempSrc: Oral Oral Oral Oral  Resp: 18 16 15 17   Height:      Weight:      SpO2: 96% 96% 96% 96%     General: average weight female, No acute distress, subtle dysarthria   Cardiovascular: RRR, nl S1, S2, 2/6 systolic murmur at the right upper sternal border, 2+ pulses, warm extremities  Respiratory: Course rales at the bilateral bases, no wheezes or rhonchi, no increased WOB  Abdomen: NABS, soft, NT/ND  MSK: Normal tone and bulk, no LEE  Neuro: Grossly intact, 4-/5 strength right upper and right lower extremity, sensation intact to light touch throughout  Discharge Instructions      Discharge Instructions    (HEART FAILURE PATIENTS) Call MD:  Anytime you have any of the following symptoms: 1) 3 pound weight gain in 24 hours or 5 pounds in 1 week 2) shortness of breath, with or without a dry hacking cough 3) swelling in the hands, feet or stomach 4) if you have  to sleep on extra pillows at night in order to breathe.    Complete by:  As directed      Ambulatory referral to Neurology    Complete by:  As directed   Pt will follow up with Dr. Leonie Man at [facility] in about 2 months. Thanks.     Call MD for:  difficulty breathing, headache or visual disturbances    Complete by:  As directed      Call MD for:  extreme fatigue    Complete by:  As directed      Call MD for:  hives    Complete by:  As directed      Call MD for:  persistant dizziness or light-headedness    Complete by:  As directed      Call MD for:  persistant nausea and vomiting    Complete by:  As directed      Call MD for:  severe uncontrolled pain    Complete by:  As directed      Call MD for:  temperature >100.4    Complete by:  As directed      Diet - low sodium heart healthy    Complete by:  As directed  Discharge instructions    Complete by:  As directed   You probably had worsened stroke symptoms secondary to dehydration, low blood pressure, and low potassium level.  Please stop your triamterene-hydrochlorothiazide and try to minimize your lasix use.  I have given you a prescription for levofloxacin for pneumonia.  Follow up with your primary care doctor in 1-2 weeks for repeat labs and blood pressure check.  She will also need to follow up on some blood work which is pending at the time of your discharge.     Increase activity slowly    Complete by:  As directed             Medication List    STOP taking these medications        nitrofurantoin 100 MG capsule  Commonly known as:  MACRODANTIN     promethazine 25 MG tablet  Commonly known as:  PHENERGAN      TAKE these medications        acetaminophen 500 MG tablet  Commonly known as:  TYLENOL  Take 1,000 mg by mouth every 6 (six) hours as needed for mild pain or moderate pain.     aspirin 81 MG tablet  Take 81 mg by mouth daily.     atorvastatin 40 MG tablet  Commonly known as:  LIPITOR  Take 1 tablet (40 mg  total) by mouth daily at 6 PM.     clopidogrel 75 MG tablet  Commonly known as:  PLAVIX  TAKE 1 TABLET BY MOUTH DAILY     DULoxetine 60 MG capsule  Commonly known as:  CYMBALTA  Take 60 mg by mouth daily.     furosemide 20 MG tablet  Commonly known as:  LASIX  Take 20 mg by mouth daily as needed for edema.     isosorbide mononitrate 60 MG 24 hr tablet  Commonly known as:  IMDUR  Take 60 mg by mouth daily.     levofloxacin 750 MG tablet  Commonly known as:  LEVAQUIN  Take 1 tablet (750 mg total) by mouth daily.     meclizine 25 MG tablet  Commonly known as:  ANTIVERT  Take 1 tablet (25 mg total) by mouth 3 (three) times daily as needed.     metoprolol succinate 25 MG 24 hr tablet  Commonly known as:  TOPROL-XL  Take 12.5 mg by mouth every morning.     nitroGLYCERIN 0.4 MG SL tablet  Commonly known as:  NITROSTAT  Place 0.4 mg under the tongue every 5 (five) minutes as needed for chest pain.     pantoprazole 40 MG tablet  Commonly known as:  PROTONIX  Take 40 mg by mouth every 12 (twelve) hours.     ranolazine 500 MG 12 hr tablet  Commonly known as:  RANEXA  Take 1 tablet (500 mg total) by mouth 2 (two) times daily.     sertraline 100 MG tablet  Commonly known as:  ZOLOFT  Take 100 mg by mouth daily.     traZODone 50 MG tablet  Commonly known as:  DESYREL  Take 50 mg by mouth at bedtime.     zolpidem 10 MG tablet  Commonly known as:  AMBIEN  Take 10 mg by mouth at bedtime as needed for sleep.       Follow-up Information    Follow up with SETHI,PRAMOD, MD. Schedule an appointment as soon as possible for a visit in 2 months.   Specialties:  Neurology, Radiology  Why:  stroke clinic   Contact information:   [address] [place] [address] [phone]       Follow up with Ola Spurr, DAVID, MD. Schedule an appointment as soon as possible for a visit in 2 weeks.   Specialty:  Infectious Diseases   Contact information:   [place] [address] [phone]        The results of significant diagnostics from this hospitalization (including imaging, microbiology, ancillary and laboratory) are listed below for reference.    Significant Diagnostic Studies: Mr Brain Wo Contrast  02/18/2015   CLINICAL DATA:  Right-sided weakness/facial droop and slurred speech.  EXAM: MRI HEAD WITHOUT CONTRAST  MRA HEAD WITHOUT CONTRAST  TECHNIQUE: Multiplanar, multiecho pulse sequences of the brain and surrounding structures were obtained without intravenous contrast. Angiographic images of the head were obtained using MRA technique without contrast.  COMPARISON:  08/10/2013  FINDINGS: MRI HEAD FINDINGS  There is no evidence of acute infarct, mass, midline shift, or extra-axial fluid collection. Focus of chronic microhemorrhage in the left frontal lobe is unchanged. Ventricles and sulci are within normal limits. Patchy T2 hyperintensities throughout the subcortical and deep cerebral white matter bilaterally are similar to the prior study and nonspecific but compatible with moderate chronic small vessel ischemic disease.  Orbits are unremarkable. There is partial opacification of the lateral recess of the right sphenoid sinus. Mastoid air cells are clear. Major intracranial vascular flow voids are preserved.  MRA HEAD FINDINGS  The visualized distal vertebral arteries are patent without stenosis. The left vertebral artery is mildly dominant. The left PICA origin is patent. Bilateral AICA and SCA origins are patent. Basilar artery is patent without stenosis. PCAs are unremarkable. Posterior communicating arteries are not identified.  Internal carotid arteries are patent from skullbase to carotid termini without stenosis. ACAs and MCAs are unremarkable aside from mild focal right A2 narrowing. No intracranial aneurysm is identified.  IMPRESSION: 1. No acute intracranial abnormality. 2. Moderate chronic  small vessel ischemic disease. 3. No evidence of major intracranial arterial occlusion or significant proximal stenosis. Mild right A2 narrowing.   Electronically Signed   By: Logan Bores   On: 02/18/2015 08:00   Dg Chest Port 1 View  02/18/2015   CLINICAL DATA:  Rales  EXAM: PORTABLE CHEST - 1 VIEW  COMPARISON:  05/14/2014  FINDINGS: Lungs are essentially clear. Mild bibasilar opacities, likely atelectasis. No focal consolidation. No pleural effusion or pneumothorax.  The heart is normal in size.  IMPRESSION: No evidence of acute cardiopulmonary disease.   Electronically Signed   By: Julian Hy M.D.   On: 02/18/2015 12:53   Mr Jodene Nam Head/brain Wo Cm  02/18/2015   CLINICAL DATA:  Right-sided weakness/facial droop and slurred speech.  EXAM: MRI HEAD WITHOUT CONTRAST  MRA HEAD WITHOUT CONTRAST  TECHNIQUE: Multiplanar, multiecho pulse sequences of the brain and surrounding structures were obtained without intravenous contrast. Angiographic images of the head were obtained using MRA technique without contrast.  COMPARISON:  08/10/2013  FINDINGS: MRI HEAD FINDINGS  There is no evidence of acute infarct, mass, midline shift, or extra-axial fluid collection. Focus of chronic microhemorrhage in the left frontal lobe is unchanged. Ventricles and sulci are within normal limits. Patchy T2 hyperintensities throughout the subcortical and deep cerebral white matter bilaterally are similar to the prior study and nonspecific but compatible with moderate chronic small vessel ischemic disease.  Orbits are unremarkable. There is partial opacification  of the lateral recess of the right sphenoid sinus. Mastoid air cells are clear. Major intracranial vascular flow voids are preserved.  MRA HEAD FINDINGS  The visualized distal vertebral arteries are patent without stenosis. The left vertebral artery is mildly dominant. The left PICA origin is patent. Bilateral AICA and SCA origins are patent. Basilar artery is patent without  stenosis. PCAs are unremarkable. Posterior communicating arteries are not identified.  Internal carotid arteries are patent from skullbase to carotid termini without stenosis. ACAs and MCAs are unremarkable aside from mild focal right A2 narrowing. No intracranial aneurysm is identified.  IMPRESSION: 1. No acute intracranial abnormality. 2. Moderate chronic small vessel ischemic disease. 3. No evidence of major intracranial arterial occlusion or significant proximal stenosis. Mild right A2 narrowing.   Electronically Signed   By: Logan Bores   On: 02/18/2015 08:00    Microbiology: No results found for this or any previous visit (from the past 240 hour(s)).   Labs: Basic Metabolic Panel:  Recent Labs Lab 02/18/15 0406 02/18/15 1210 02/19/15 0737  NA  --  138 142  K  --  3.2* 3.7  CL  --  101 105  CO2  --  27 31  GLUCOSE  --  125* 95  BUN  --  15 10  CREATININE 0.88 0.85 0.82  CALCIUM  --  8.9 9.0  MG  --  2.1  --    Liver Function Tests: No results for input(s): AST, ALT, ALKPHOS, BILITOT, PROT, ALBUMIN in the last 168 hours. No results for input(s): LIPASE, AMYLASE in the last 168 hours. No results for input(s): AMMONIA in the last 168 hours. CBC:  Recent Labs Lab 02/18/15 0406 02/19/15 0737  WBC 10.8* 7.5  HGB 12.1 11.5*  HCT 36.2 34.5*  MCV 94.3 93.8  PLT 310 276   Cardiac Enzymes: No results for input(s): CKTOTAL, CKMB, CKMBINDEX, TROPONINI in the last 168 hours. BNP: BNP (last 3 results)  Recent Labs  02/18/15 0406  BNP 15.5    ProBNP (last 3 results) No results for input(s): PROBNP in the last 8760 hours.  CBG: No results for input(s): GLUCAP in the last 168 hours.  Time coordinating discharge: 35 minutes  Signed:  Hermon Zea  [facility] 02/19/2015, 3:00 PM

## 2015-02-19 NOTE — Evaluation (Signed)
Speech Language Pathology Evaluation Patient Details Name: [name] MRN: [id number] DOB: 05-Oct-1956 Today's Date: 02/19/2015 Time: 9211-9417 SLP Time Calculation (min) (ACUTE ONLY): 24 min  Problem List:  Patient Active Problem List   Diagnosis Date Noted  . Stroke 02/18/2015  . Tobacco abuse 02/18/2015  . History of stroke 07/18/2014  . Vertigo 07/18/2014  . Bilateral wrist pain 07/18/2014  . Atypical chest pain 06/05/2014  . CAD (coronary artery disease) 01/19/2014  . Carotid arterial disease 01/19/2014  . Depression 01/16/2014  . Memory loss 01/16/2014  . Right sided weakness 08/10/2013  . Hypertension 08/10/2013  . Hyperlipidemia 08/10/2013  . History of MI (myocardial infarction) 08/10/2013  . Stroke, acute, embolic 40/81/4481   Past Medical History:  Past Medical History  Diagnosis Date  . Hypertension   . Hypercholesteremia   . CAD (coronary artery disease)     a. 03/2013 NSTEMI/Cath: 100 RCA, EF 45%->Med Rx;  b. 12/2013 Cath/PCI: LAD 47m, 30d, D1 80, D2 60, LCX nl, RCA 50ost/p, 28m, 95d (2.5x33 Xience DES);  c. 05/2014 Lexi MV: EF 68%, no ischemia.  Marland Kitchen CVA (cerebral vascular accident)     a. 08/2013.  . Tobacco abuse   . Anxiety   . Depression   . Migraines   . Ischemic cardiomyopathy     a. 03/2013 Echo: EF 30-35%, mod dil LA, mod-sev MR, mod TR;  b. 08/2013 Echo EF 60-65%, mild AI.  Marland Kitchen Syncope and collapse   . Carotid disease, bilateral     a. 08/2013 Carotid U/S: 1-39% bilat ICA stenosis.   Past Surgical History:  Past Surgical History  Procedure Laterality Date  . Oophorectomy    . Cesarean section    . Cesarean section with bilateral tubal ligation    . Knee arthroscopy  11/27/2006    left knee   . Cardiac catheterization  01/01/2014  . Coronary angioplasty with stent placement  01/01/2014    95% lesion with a drug eluting stent to the distal RCA.  . Cardiac catheterization  03/16/2013  . Cardiac catheterization  12/2013   HPI:  59 yo female adm to  [facility] with slurred speech, weakness - Imaging studies negative.  Pt reports issues with potassium and blood pressure reportedly source of symptoms.  PMH + for MI, CVA 10/14, CAD, migraine, GERD, tobacco use.  Pt reports recent upper respiratory illness.     Assessment / Plan / Recommendation Clinical Impression  MOCA administered with pt receiving score of 22 of 30 with normal being 26 or above.  Area of strengths include visuospatial, naming and orientation. Challenges include memory, attention and language repetition.  Pt named 10 words starting with B - norm being 11.  She demonstrated fluent expressive/receptive language but has flat affect *pt reports PTSD.  Pt reports premorbid memory deficits (eg can easily leave on the stove). She demonstrated good awareness to her deficits and need to compensate (medication box, timer on phone, planner use, etc).    SLP provided pt with list of compensation strategies orally and in writing.  No further SLP warranted.      SLP Assessment  Patient does not need any further Speech Lanaguage Pathology Services    Follow Up Recommendations  None    Frequency and Duration     n/a   Pertinent Vitals/Pain Pain Assessment: No/denies pain   SLP Goals  Patient/Family Stated Goal: none stated  SLP Evaluation Prior Functioning  Cognitive/Linguistic Baseline: Baseline deficits Baseline deficit details: pt reports prior memory deficits  that she attributes to her reported PTSD  Type of Home: House  Lives With: Significant other Education: has not worked for 2 years, reports applying for disability, Associates degree in early childhood education   Cognition  Overall Cognitive Status: History of cognitive impairments - at baseline Arousal/Alertness: Awake/alert Orientation Level: Oriented X4 Attention: Selective Selective Attention: Appears intact Memory: Impaired Awareness: Appears intact Problem Solving: Appears intact    Comprehension  Auditory  Comprehension Overall Auditory Comprehension: Appears within functional limits for tasks assessed Commands: Within Functional Limits Reading Comprehension Reading Status: Within funtional limits    Expression Expression Primary Mode of Expression: Verbal Verbal Expression Overall Verbal Expression: Appears within functional limits for tasks assessed Initiation: No impairment Pragmatics: Impairment Impairments: Abnormal affect;Monotone (flat affect, ? due to pt reported PTSD) Written Expression Dominant Hand: Right Written Expression: Within Functional Limits (clock drawing, cylinder drawing)   Oral / Motor Oral Motor/Sensory Function Overall Oral Motor/Sensory Function: Appears within functional limits for tasks assessed Motor Speech Overall Motor Speech: Appears within functional limits for tasks assessed   [place], [place] [facility] SLP [phone]

## 2015-02-19 NOTE — Evaluation (Signed)
Occupational Therapy Evaluation Patient Details Name: [name] MRN: [id number] DOB: 03-29-1956 Today's Date: 02/19/2015    History of Present Illness 59 yo admitted with slurred speech, weakness and weakness. MRI (_) PMH: MI, CVA 10/14, CAD, migraine, GERD, tobacco use.   Clinical Impression   Patient evaluated by Occupational Therapy with no further acute OT needs identified. All education has been completed and the patient has no further questions. See below for any follow-up Occupational Therapy or equipment needs. OT to sign off. Thank you for referral.   Pt demonstrates diplopia and nystagmus with R eye movement laterally and L superior. Pt educated on eye exercises and advised to discuss driving with MD     Follow Up Recommendations  No OT follow up    Equipment Recommendations  None recommended by OT    Recommendations for Other Services       Precautions / Restrictions Precautions Precautions: None      Mobility Bed Mobility                  Transfers Overall transfer level: Needs assistance   Transfers: Sit to/from Stand Sit to Stand: Modified independent (Device/Increase time)              Balance Overall balance assessment: Modified Independent                                          ADL Overall ADL's : Modified independent                                       General ADL Comments: pt able to complete LB dressing, adl at sink level, and no balance deficits noted     Vision Vision Assessment?: Yes Eye Alignment: Within Functional Limits Ocular Range of Motion: Within Functional Limits Alignment/Gaze Preference: Within Defined Limits Tracking/Visual Pursuits: Decreased smoothness of eye movement to LEFT superior field;Right eye does not track laterally Convergence: Impaired (comment) Diplopia Assessment: Present in near gaze Additional Comments: Pt with nystagmus with R eye with tracking.  Advised to ask MD about driving due to diplopia   Perception     Praxis      Pertinent Vitals/Pain Pain Assessment: No/denies pain     Hand Dominance Right   Extremity/Trunk Assessment Upper Extremity Assessment Upper Extremity Assessment: RUE deficits/detail RUE Deficits / Details: pt able to open all adls items   Lower Extremity Assessment Lower Extremity Assessment: Defer to PT evaluation   Cervical / Trunk Assessment Cervical / Trunk Assessment: Normal   Communication Communication Communication: No difficulties   Cognition Arousal/Alertness: Awake/alert Behavior During Therapy: WFL for tasks assessed/performed Overall Cognitive Status: History of cognitive impairments - at baseline                     General Comments       Exercises       Shoulder Instructions      Home Living Family/patient expects to be discharged to:: Private residence Living Arrangements: Spouse/significant other Available Help at Discharge: Family Type of Home: House       Home Layout: Two level;Other (Comment);Full bath on main level (stairs to craft room)     Bathroom Shower/Tub: Occupational psychologist: Standard     Home Equipment: None  Lives With: Significant other    Prior Functioning/Environment Level of Independence: Independent             OT Diagnosis:     OT Problem List:     OT Treatment/Interventions:      OT Goals(Current goals can be found in the care plan section) Acute Rehab OT Goals Potential to Achieve Goals: Good  OT Frequency:     Barriers to D/C:            Co-evaluation              End of Session Nurse Communication: Mobility status;Precautions  Activity Tolerance: Patient tolerated treatment well Patient left: in chair;with call bell/phone within reach   Time: 0850-0900 OT Time Calculation (min): 10 min Charges:  OT General Charges $OT Visit: 1 Procedure OT Evaluation $Initial OT Evaluation  Tier I: 1 Procedure G-CodesParke Poisson B 2015/03/15, 10:03 AM  Pager: [phone]

## 2015-02-19 NOTE — Evaluation (Signed)
Physical Therapy Evaluation Patient Details Name: [name] MRN: [id number] DOB: Jun 06, 1956 Today's Date: 02/19/2015   History of Present Illness  59 yo admitted with slurred speech, weakness and weakness. MRI (-) PMH: MI, CVA 10/14, CAD, migraine, GERD, tobacco use.  Clinical Impression  Patient presents close to functional baseline and tolerated ambulating community distances and negotiating steps without LOB. Reports all symptoms have resolved. Education provided on signs/symptoms of CVA. Pt does not require skilled therapy services. Discharge from therapy.     Follow Up Recommendations No PT follow up;Supervision - Intermittent    Equipment Recommendations  None recommended by PT    Recommendations for Other Services       Precautions / Restrictions Precautions Precautions: None Restrictions Weight Bearing Restrictions: No      Mobility  Bed Mobility Overal bed mobility: Modified Independent                Transfers Overall transfer level: Modified independent Equipment used: None Transfers: Sit to/from Stand Sit to Stand: Modified independent (Device/Increase time)            Ambulation/Gait Ambulation/Gait assistance: Modified independent (Device/Increase time) Ambulation Distance (Feet): 200 Feet Assistive device: None Gait Pattern/deviations: Step-through pattern;Decreased stride length   Gait velocity interpretation: Below normal speed for age/gender General Gait Details: Pt with slow and steady gait. No LOB or balance deficits noted.   Stairs Stairs: Yes Stairs assistance: Modified independent (Device/Increase time) Stair Management: One rail Right;Step to pattern Number of Stairs: 12 General stair comments: Safe stair negotiation.   Wheelchair Mobility    Modified Rankin (Stroke Patients Only)       Balance Overall balance assessment: Modified Independent                                           Pertinent  Vitals/Pain Pain Assessment: No/denies pain    Home Living Family/patient expects to be discharged to:: Private residence Living Arrangements: Spouse/significant other Available Help at Discharge: Family Type of Home: House Home Access: Level entry     Home Layout: Two level;Other (Comment);Full bath on main level Home Equipment: None      Prior Function Level of Independence: Independent         Comments: Likes to participate in craft making, works in garden     Journalist, newspaper   Dominant Hand: Right    Extremity/Trunk Assessment   Upper Extremity Assessment: Defer to OT evaluation           Lower Extremity Assessment: Overall WFL for tasks assessed      Cervical / Trunk Assessment: Normal  Communication   Communication: No difficulties  Cognition Arousal/Alertness: Awake/alert Behavior During Therapy: WFL for tasks assessed/performed Overall Cognitive Status: History of cognitive impairments - at baseline                      General Comments      Exercises        Assessment/Plan    PT Assessment Patent does not need any further PT services  PT Diagnosis     PT Problem List    PT Treatment Interventions     PT Goals (Current goals can be found in the Care Plan section) Acute Rehab PT Goals PT Goal Formulation: All assessment and education complete, DC therapy    Frequency     Barriers to  discharge        Co-evaluation               End of Session Equipment Utilized During Treatment: Gait belt Activity Tolerance: Patient tolerated treatment well Patient left: in chair;with call bell/phone within reach Nurse Communication: Mobility status         Time: 6484-7207 PT Time Calculation (min) (ACUTE ONLY): 13 min   Charges:   PT Evaluation $Initial PT Evaluation Tier I: 1 Procedure     PT G CodesCandy Sledge A 05-Mar-2015, 2:30 PM Candy Sledge, [place], DPT [phone]

## 2015-02-19 NOTE — Progress Notes (Signed)
UR COMPLETED  

## 2015-02-19 NOTE — Progress Notes (Signed)
  Echocardiogram 2D Echocardiogram has been performed.  Darlina Sicilian M 02/19/2015, 1:00 PM

## 2015-02-19 NOTE — Progress Notes (Signed)
Discharge instruction reviewed with patient. All questions answered at this time. Awaiting for transport from family.  Ola Fawver, RN 

## 2015-02-20 LAB — CORTISOL-AM, BLOOD: CORTISOL - AM: 16.8 ug/dL (ref 4.3–22.4)

## 2015-03-01 NOTE — H&P (Signed)
PATIENT NAME:  [name], [name] MR#:  [id number] DATE OF BIRTH:  [birth year]  DATE OF ADMISSION:  03/15/2013  REFERRING PHYSICIAN: Dr. Robet Leu.   FAMILY PHYSICIAN: Dr. Ola Spurr.   REASON FOR ADMISSION: Chest pain worrisome for unstable angina.   HISTORY OF PRESENT ILLNESS: The patient is a 59 year old female with a history of benign hypertension, hyperlipidemia, and smoking who presents to the Emergency Room with a of a 5 to 6-day history of stuttering chest pain described as pressure associated with shortness of breath, radiating to the left arm. Symptoms are lasting up to 4 hours at a time.  Her symptoms are nonexertional. In the Emergency Room, the patient was noted to have lateral ST segment changes consistent with ischemia associated with her chest pain. Her pain was improved with nitroglycerin from 6/10 down to 2/10. She is now admitted for further evaluation.   PAST MEDICAL HISTORY: 1. Benign hypertension.  2. Hyperlipidemia.  3. Recurrent urinary tract infections. 4. Anxiety/depression.  5. Migraine headaches.  6. Status post left knee surgery.  7. Tobacco abuse.  8. Status post bilateral tubal ligation.   MEDICATIONS: 1. Prozac 20 mg p.o. daily.  2. Macrobid 100 mg p.o. at bedtime.  3. Lorazepam 0.5 mg p.o. twice a day p.r.n.  4. Aspirin 81 mg p.o. daily.  5. Ambien 5 mg p.o. at bedtime.   ALLERGIES: PENICILLIN AND STATINS.   SOCIAL HISTORY: The patient smokes 1/2 to 1 pack of cigarettes per day. No history of alcohol abuse. Works full time.   FAMILY HISTORY: Positive for coronary artery disease and hypertension. Otherwise unremarkable.   REVIEW OF SYSTEMS:   CONSTITUTIONAL: No fever or change in weight.   EYES: No blurred or double vision. No glaucoma.   ENT: No tinnitus or hearing loss. No nasal discharge or bleeding. No difficulty swallowing.   RESPIRATORY: No cough or wheezing. No painful respiration.   CARDIOVASCULAR: No orthopnea or palpitations. No syncope.    GASTROINTESTINAL: No nausea, vomiting, or diarrhea. No abdominal pain. No change in bowel habits.   GENITOURINARY: No dysuria or hematuria. No incontinence.   ENDOCRINE: No polyuria or polydipsia. No heat or cold intolerance.   HEMATOLOGIC: The patient denies anemia, easy bruising, or bleeding.   LYMPHATIC: No swollen glands.   MUSCULOSKELETAL: The patient denies pain in her neck, back, shoulders, knees, or hips. No gout.   NEUROLOGIC: Had numbness in her left arm with the pain. No weakness. No recent migraines.  Denies stroke or seizures.   PSYCHIATRIC: The patient admits to some anxiety and depression and also occasional insomnia.   PHYSICAL EXAMINATION: GENERAL: The patient is in no acute distress.   VITAL SIGNS: Currently remarkable for blood pressure of 178/79 with a heart rate of 67 and a respiratory rate of 18. She is afebrile.   HEENT: Normocephalic, atraumatic. Pupils equally round and reactive to light and accommodation. Extraocular movements are intact. Sclerae anicteric. Conjunctivae are clear.   OROPHARYNX: Clear.   NECK: Supple without JVD or bruits. No adenopathy or thyromegaly is noted.   LUNGS: Clear to auscultation and percussion without wheezes, rales, or rhonchi. No dullness. Respiratory effort is normal.   CARDIAC: Regular rate and rhythm with normal S1, S2. There is a 1/6 systolic murmur noted. No rubs or gallops are present. PMI is nondisplaced. Chest wall is nontender.   ABDOMEN: Soft, nontender with normoactive bowel sounds. No organomegaly or masses were appreciated. No hernias or bruits were noted.   EXTREMITIES: Without clubbing, cyanosis, or edema.  Pulses were 2+ bilaterally.   SKIN: Warm and dry without rash or lesions.   NEUROLOGIC: Cranial nerves II through XII grossly intact. Deep tendon reflexes were symmetric. Motor and sensory examination is nonfocal.   PSYCHIATRIC: The patient was alert and oriented to person, place, and time. She was  cooperative and used good judgment.   LABORATORY DATA: EKG revealed sinus rhythm with lateral ST segment depression. Chest x-ray was unremarkable. Glucose was 79 with a BUN of 10 and a creatinine of 0.6 with a sodium of 142 and a potassium of 3.6. GFR was greater than 60. White count was 7.0 with hemoglobin of 12.5.   ASSESSMENT: 1. Chest pain worrisome for unstable angina.  2. Abnormal EKG.  3. Benign hypertension.  4. Hyperlipidemia.  5. Tobacco abuse.  6. Family history of coronary artery disease.   PLAN: The patient will be observed on telemetry with Lovenox, aspirin, topical nitrates and beta blocker therapy. Will follow serial cardiac enzymes and obtain an echocardiogram. Because of her resting symptoms, it is not safe to stress her in the morning. We will obtain a cardiology consult for possible cardiac catheterization. We will continue her Prozac and lorazepam at this time. We will supplement oxygen as needed. We will check her lipid status in the morning. Further treatment and evaluation will depend upon the patient's progress.   Total time spent on this patient is 45 minutes.    ____________________________ Leonie Douglas Doy Hutching, MD jds:rw D: 03/15/2013 18:42:48 ET T: 03/15/2013 19:24:46 ET JOB#: [id number]  cc: Leonie Douglas. Doy Hutching, MD, <Dictator> Cheral Marker. Ola Spurr, MD  Wendal Wilkie Lennice Sites MD ELECTRONICALLY SIGNED 03/15/2013 20:45

## 2015-03-01 NOTE — Consult Note (Signed)
PATIENT NAME:  [name], [name] MR#:  [id number] DATE OF BIRTH:  11-16-1955  DATE OF CONSULTATION:  03/15/2013  CONSULTING PHYSICIAN:  Isaias Cowman, MD  PRIMARY CARE PHYSICIAN: Cheral Marker. Ola Spurr, MD  CHIEF COMPLAINT: Chest pain.   REASON FOR CONSULTATION: Consultation requested for evaluation of chest pain and elevated troponin.   HISTORY OF PRESENT ILLNESS: The patient is a 59 year old female with history of hypertension and hyperlipidemia who presents with chest pain. The patient reports that she first experienced chest pain approximately a week ago. Today, the patient had a prolonged episode of chest discomfort described as a pressure sensation with radiation to her left arm. She presented to [facility] Emergency Room where initial EKG revealed lateral ST depression. The patient received sublingual nitroglycerin with relief of chest pain. Initial troponin was elevated at 0.36.   PAST MEDICAL HISTORY:  1.  Hypertension.  2.  Hyperlipidemia.  3.  Tobacco abuse.  4.  Anxiety, depression.  5.  Migraines.   MEDICATIONS: Aspirin 81 mg daily, Prozac 20 mg daily, Macrobid 100 mg at bedtime, lorazepam 5 mg b.i.d. p.r.n., Ambien 5 mg at bedtime.   SOCIAL HISTORY: The patient is single, lives with her boyfriend. She smokes a pack of cigarettes a day.   FAMILY HISTORY: No immediate family history for coronary artery disease or myocardial infarction.   REVIEW OF SYSTEMS:  CONSTITUTIONAL: No fever or chills.  EYES: No blurry vision.  EARS: No hearing loss.  RESPIRATORY: No shortness of breath.  CARDIOVASCULAR: Chest pain as described above.  GASTROINTESTINAL: No nausea, vomiting, diarrhea or constipation.  GENITOURINARY: No dysuria or hematuria.  ENDOCRINE: No polyuria or polydipsia.  HEMATOLOGICAL: No easy bruising or bleeding.  INTEGUMENTARY: No rash.  MUSCULOSKELETAL: No arthralgias or myalgias.  NEUROLOGICAL: No focal muscle weakness or numbness.  PSYCHOLOGICAL: No depression or  anxiety.   PHYSICAL EXAMINATION:  VITAL SIGNS: Blood pressure on 162/78, pulse 58, respirations 16, temperature 98.2, pulse oximetry 94%.  HEENT: Pupils equal, reactive to light and accommodation.  NECK: Supple without thyromegaly.  LUNGS: Clear.  HEART: Normal JVP. Normal PMI. Regular rate and rhythm. Normal S1, S2. No appreciable gallop, murmur or rub.  ABDOMEN: Soft and nontender.  EXTREMITIES: Pulses were intact bilaterally.  MUSCULOSKELETAL: Normal muscle tone.  NEUROLOGICAL: The patient is alert and oriented x 3. Motor and sensory both grossly intact.   IMPRESSION: This is a 59 year old female with multiple cardiovascular risk factors who presents with chest pain consistent with crescendo angina with borderline elevated troponin and possible non-ST elevation myocardial infarction.   RECOMMENDATIONS:  1.  Continue present medications.  2.  Proceed with cardiac catheterization with selective coronary arteriography on 03/16/2013. Risks, benefits and alternatives were explained and informed written consent obtained.   ____________________________ Isaias Cowman, MD ap:jm D: 03/15/2013 20:57:18 ET T: 03/15/2013 21:49:35 ET JOB#: [id number]  cc: Isaias Cowman, MD, <Dictator> Isaias Cowman MD ELECTRONICALLY SIGNED 03/28/2013 8:59

## 2015-03-01 NOTE — Consult Note (Signed)
Brief Consult Note: Diagnosis: [place], possible NSTEMI.   Patient was seen by consultant.   Consult note dictated.   Comments: REC  Agree with current therapy, cardiac catheterization 03/16/13.  Electronic Signatures: Isaias Cowman (MD)  (Signed 07-May-14 20:58)  Authored: Brief Consult Note   Last Updated: 07-May-14 20:58 by Isaias Cowman (MD)

## 2015-03-02 NOTE — Consult Note (Signed)
General Aspect 59 y/o female with a h/o CAD s/p RCA stenting in 12/2013, who presented to the ED today with chest pain. _____________________  PMH  1.  CAD      a. 03/2013 NSTEMI/Cath: RCA 100 with L->R collats, EF 46%-->Med Rx.      b. 12/2013 Cath/PCI: LAD 31m 30d, D1 80, D2 60, LCX nl, RCA 50 ost/prox, 680m95d (2.5 x 33 mm Xience DES).  2.  ICM/H/O Systolic CHF      a. 5/1/5726cho: EF 30-35%, mod dil LA, mod-sev MR, mod TR.      b. 08/2013 Echo: EF 60-65%, mild AI. 3.  HTN 4.  HL 5.  CVA      a. 08/2013 - Event monitoring unrevealing. 6.  H/O Tobacco Abuse - quit 03/2013. 7.  Anxiety 8.  Depression 9.  H/O syncope 10.  Carotid Vascular Dzs      a. 08/2013 Carotid U/S: 1-39% bilat ICA stenosis. _____________________   Present Illness [age]/o female with the above complex problem list.  She has a h/o NSTEMI in 03/2013 with cath revealing an occluded RCA with L->R collaterals.  She was medically managed at that time.  EF was depressed @ 30-35% and mod-sev MR was noted.. In 08/2013, she suffered a stroke and was treated @ [facility].  Repeat echo showed normalization of LV fxn (60-65%), w/o evidence of MR.  F/U event monitoring did not show evidence of afib.  Pt says that following NSTEMI in 03/2013, she continued to have intermittent chest pain.  Decision was made by her cardiologist to perfom PCI of the RCA this past February.  She says that following stenting, she had complete resolution of chest pain.  She is not very active @ home but says she is normally able to perform routine chores around the house without c/p or dyspnea.  Beginning on 7/2 however, she began to experience nearly constant 6-10/10 sscp that has been worse with deep breathing and exertion.  She has also noted intermittent nausea, diaphoresis, and DOE.  As Ss persisted all weekend long, she decided to present to the ED today.  There, ECG showed anterolateral TWI (old) and troponin was normal.  She was admitted for further  evaluation.  She continues to complain of 6/10 sscp that is better with pressure applied to her chest.   Physical Exam:  GEN pleasant, nad.   HEENT pink conjunctivae, moist oral mucosa   NECK supple  No masses  no jvd/bruits.   RESP normal resp effort  diffuse insp crackles.   CARD Regular rate and rhythm  Normal, S1, S2  No murmur   ABD denies tenderness  positive liver/spleen enlargement  soft  normal BS   LYMPH negative neck   EXTR negative cyanosis/clubbing, negative edema, DP/PT 2+ = bilat.   SKIN normal to palpation   NEURO grossly intact, nonfocal.   PSYCH alert, A+O to time, place, person   Review of Systems:  General: intermittent diaphoresis.   Skin: No Complaints   ENT: No Complaints   Eyes: No Complaints   Neck: No Complaints   Respiratory: DOE   Cardiovascular: Chest pain or discomfort  worse with deep breathing, better with palpation.   Gastrointestinal: intermittent nausea.   Genitourinary: No Complaints   Vascular: No Complaints   Musculoskeletal: No Complaints   Neurologic: No Complaints   Hematologic: No Complaints   Endocrine: No Complaints   Psychiatric: No Complaints   Review of Systems: All other systems were  reviewed and found to be negative   Medications/Allergies Reviewed Medications/Allergies reviewed   Family & Social History:  Family and Social History:  Family History Father died @ young age - suicide.  Mother suffered CVA in her 70's.  Older sister with h/o CVA.   Social History previously smoked heavily, quit 03/2013.  No etoh/drugs.   + Tobacco Prior (greater than 1 year)  quit 03/2013.   Place of Living Home  does not work.   Lab Results:  Thyroid:  06-Jul-15 11:53   Thyroid Stimulating Hormone 1.45 (0.45-4.50 (International Unit)  ----------------------- Pregnant patients have  different reference  ranges for TSH:  - - - - - - - - - -  Pregnant, first trimetser:  0.36 - 2.50 uIU/mL)  Routine Chem:   06-Jul-15 11:53   Hemoglobin A1c ([facility]) 5.8 (The American Diabetes Association recommends that a primary goal of therapy should be <7% and that physicians should reevaluate the treatment regimen in patients with HbA1c values consistently >8%.)  Glucose, Serum  144  BUN  19  Creatinine (comp) 0.91  Sodium, Serum 138  Potassium, Serum 3.7  Chloride, Serum 103  CO2, Serum 28  Calcium (Total), Serum 9.3  Anion Gap 7  Osmolality (calc) 280  eGFR (African American) >60  eGFR (Non-African American) >60 (eGFR values <24m/min/1.73 m2 may be an indication of chronic kidney disease (CKD). Calculated eGFR is useful in patients with stable renal function. The eGFR calculation will not be reliable in acutely ill patients when serum creatinine is changing rapidly. It is not useful in  patients on dialysis. The eGFR calculation may not be applicable to patients at the low and high extremes of body sizes, pregnant women, and vegetarians.)  Cardiac:  06-Jul-15 11:53   Troponin I < 0.02 (0.00-0.05 0.05 ng/mL or less: NEGATIVE  Repeat testing in 3-6 hrs  if clinically indicated. >0.05 ng/mL: POTENTIAL  MYOCARDIAL INJURY. Repeat  testing in 3-6 hrs if  clinically indicated. NOTE: An increase or decrease  of 30% or more on serial  testing suggests a  clinically important change)  CK, Total 99 (26-192 NOTE: NEW REFERENCE RANGE  12/11/2013)  CPK-MB, Serum 0.5 (Result(s) reported on 14 May 2014 at 04:49PM.)  Routine Hem:  06-Jul-15 11:53   WBC (CBC) 5.9  RBC (CBC) 3.96  Hemoglobin (CBC) 12.8  Hematocrit (CBC) 38.2  Platelet Count (CBC) 236 (Result(s) reported on 14 May 2014 at 12:13PM.)  MCV 96  MCH 32.2  MCHC 33.4  RDW 12.6   EKG:  EKG Interp. by me   Interpretation RSR, 68, antlat TWI, no acute st/t changes.   Radiology Results: XRay:    06-Jul-15 14:09, Chest Portable Single View  Chest Portable Single View   REASON FOR EXAM:    chest pain  COMMENTS:       PROCEDURE:  DXR - DXR PORTABLE CHEST SINGLE VIEW  - May 14 2014  2:09PM     CLINICAL DATA:  Chest pain.    EXAM:  PORTABLE CHEST - 1 VIEW    COMPARISON:  December 30, 2013.    FINDINGS:  The heart size and mediastinal contours are within normal limits.  Both lungs are clear. No pneumothorax or pleural effusion is noted.  The visualized skeletal structures are unremarkable.     IMPRESSION:  No acute cardiopulmonary abnormality seen.      Electronically Signed    By: JSabino DickM.D.    On: 05/14/2014 14:13  Verified By: Marveen Reeks, M.D.,    Penicillin: GI Distress  Vital Signs/Nurse's Notes: **Vital Signs.:   06-Jul-15 16:42  Vital Signs Type Admission  Temperature Temperature (F) 98.9  Temperature Source oral  Pulse Pulse 60  Respirations Respirations 18  Systolic BP Systolic BP 530  Diastolic BP (mmHg) Diastolic BP (mmHg) 79  Mean BP 98  Pulse Ox % Pulse Ox % 97  Pulse Ox Activity Level  At rest  Oxygen Delivery Room Air/ 21 %  *Intake and Output.:   06-Jul-15 16:43  Grand Totals Intake:  0 Output:  0    Net:  0 24 Hr.:  0  Weight Type admission  Weight Method Bed  Current Weight (lbs) (lbs) 137.9  Current Weight (kg) (kg) 62.5  Height (ft) (feet) 5  Height (in) (in) 2  Height (cm) centimeters 157.4  BSA (m2) 1.6  BMI (kg/m2) 25.2    Impression 1.  Midsternal Chest Pain/CAD:  Pt presents with a 5 day history of nearly constant chest pain that has been worse with deep breathing and with exertion.  Despite prolonged Ss, initial troponin is nl and though ECG is abnl, with antlat TWI, there are no acute changes.  Agree with ongoing cycling of CE.  Provided that CE remain negative, would pursue lexiscan myoview in AM to r/o ischemia.  Cont asa, plavix, bb, statin, ranexa.  2.  HTN: Stable.  3.  HL:  Cont statin therapy.  4.  H/O ICM:  EF prev depressed but normalized by echo in 08/2013.  Euvolemic on exam.  Given pleuritic component to Ss, would repeat  echo to r/o effusion.  No evidence of rub on exam.  No ST/PR changes to suggest pericarditis on ECG.   Electronic Signatures for Addendum Section:  Kathlyn Sacramento (MD) (Signed Addendum 06-Jul-15 18:44)  The patient was seen and examined. Agree with the above. She has known history CAD with previous RCA PCI. Now with chest pain of 5 days duration. The pain is atypical. Exam is unremarkable. Negative TnI so far. Agree with echo and stress test tomorrow.   Electronic Signatures: Kathlyn Sacramento (MD)  (Signed 06-Jul-15 18:44)  Co-Signer: General Aspect/Present Illness, History and Physical Exam, Home Medications, Allergies Murray Hodgkins R (NP)  (Signed 06-Jul-15 17:23)  Authored: General Aspect/Present Illness, History and Physical Exam, Review of System, Family & Social History, Home Medications, Labs, EKG , Radiology, Allergies, Vital Signs/Nurse's Notes, Impression/Plan   Last Updated: 06-Jul-15 18:44 by Kathlyn Sacramento (MD)

## 2015-03-02 NOTE — Discharge Summary (Signed)
PATIENT NAME:  [name], [name] MR#:  [id number] DATE OF BIRTH:  [birth year]  DATE OF ADMISSION:  12/31/2013 DATE OF DISCHARGE: 01/02/2014   CONSULTING CARDIOLOGIST: Dr. Clayborn Bigness and Dr. Saralyn Pilar.   DISCHARGE DIAGNOSES:  1.  Unstable angina, status post stenting.  2.  Hypertension.  3.  Prior cerebrovascular accident.  4.  Hyperlipidemia.   HISTORY OF PRESENT ILLNESS: Please see initial history and physical for details. Briefly, a 59 year old with history of coronary disease, prior recent CVA, hypertension and hyperlipidemia, who was admitted with chest pain, 9 out of 10, crushing in intensity. The patient was admitted for rule out MI. Cardiac enzymes were negative. She was seen by Dr. Clayborn Bigness who recommended to proceed with cardiac cath.  1.  The patient had cardiac cath on February 23 by Dr. Saralyn Pilar. This did show a 95% lesion in the distal RCA. This was stented with a drug eluding stent. The patient was transferred to the intensive care unit afterwards and remained chest pain free. She remained on her aspirin and Plavix and was started on Ranexa.  2.  Hypertension. Blood pressure remained well controlled on metoprolol as well as hydrochlorothiazide/triamterene.  3.  Prior CVA. The patient has continued to follow with Dr. Leonie Man and recently had a 30 day Holter done. We will see those results. She remains tenuous in her ability to work and we will consider extending her disability.   DISCHARGE MEDICATIONS:  1.  Isosorbide 60 mg once a day.  2.  Nitroglycerin p.r.n.  3.  Ranexa 500 mg 1 twice a day.  4.  Prozac 40 mg once a day.  5.  Atorvastatin 40 mg once a day.  6.  Maxzide 25/37.5 once a day.  7.  Plavix 75 mg once a day.  8.  Metoprolol ER 25 mg once a day.  9.  Pantoprazole 40 mg once a day.  10. Macrobid 100 mg once a day.  11. Aspirin 81 mg once a day.   DISCHARGE ACTIVITY: As tolerated.   DISCHARGE FOLLOWUP: The patient will follow up with Dr. Ola Spurr in 1 to 2 weeks. She  will also follow up with cardiology.   DISCHARGE DIET: Low-sodium, low-fat cardiac diet.  TIME SPENT: This discharge took 35 minutes.  ____________________________ Cheral Marker. Ola Spurr, MD dpf:aw D: 01/02/2014 09:34:28 ET T: 01/02/2014 10:16:44 ET JOB#: [id number]  cc: Cheral Marker. Ola Spurr, MD, <Dictator> Lawrnce Reyez Ola Spurr MD ELECTRONICALLY SIGNED 01/03/2014 23:23

## 2015-03-02 NOTE — H&P (Signed)
PATIENT NAME:  [name], [name] MR#:  [id number] DATE OF BIRTH:  07-13-1956  DATE OF ADMISSION:  12/31/2013  CHIEF COMPLAINT: Chest pain.   PRIMARY CARE PHYSICIAN: Dr. Ola Spurr.  PRIMARY CARDIOLOGIST: Dr. Saralyn Pilar.   REFERRING PHYSICIAN: Dr. Francella Solian.  HISTORY OF PRESENT ILLNESS: This is a very nice, 59 year old female with history of coronary artery disease, medical treatment, hypertension, hyperlipidemia, previous smoker, who comes to the Emergency Department with a history of chest pain that started at 5:00 p.m. today, while she was resting. The pain felt like something sitting on her chest, crushing, 9 out of 10 in intensity, steady, without any significant radiation, although she had some left arm numbness and then, after a while, she says that maybe it was radiating a little bit into the back. The patient had an aspirin right away after the pain started. She took two nitroglycerins back to back, and each of them decreased the pain from 9 down to 6. After the second nitroglycerin, the pain came back after a while, severely, and she decided to come to the Emergency Department.   In the Emergency Department, she received more nitroglycerin. So far she has taken 6, without any full relief. The patient has 4 out of 10 pain right now. Left arm numbness seems to be stable and, overall, the patient states that this is a pain very similar to the one that she had before, whenever she had the heart attack. She denies any significant nausea, vomiting, dizziness, lightheadedness at this moment, or diaphoresis. No shortness of breath. The patient states that she has been off a Holter monitor for several days, and this was done because she continued to be dizzy, lightheaded, and occasional chest tightness. The patient is going to be admitted for treatment of unstable angina. Dr. Clayborn Bigness has been already contacted.    REVIEW OF SYSTEMS:  CONSTITUTIONAL: No fever, chills, weight loss or weight gain.  EYES: No  blurry vision, double vision.  EARS, NOSE, THROAT: No difficulty swallowing. No tinnitus.  RESPIRATORY: No cough. No wheezing or crepitus. No shortness of breath.  CARDIOVASCULAR: Positive chest pain. Negative orthopnea. She does have systolic dysfunction with ejection fraction up to 30%, and she has had fluid overload in the past. No syncopal episodes lately.  GASTROINTESTINAL: No nausea, vomiting, abdominal pain, constipation, diarrhea.  GENITOURINARY: No dysuria, hematuria.  GYNECOLOGIC: No breast masses. HEMATOLOGIC/LYMPHATIC: No anemia or easy bruising.  SKIN: No rashes or petechiae.  MUSCULOSKELETAL: No significant neck pain or back pain.  NEUROLOGIC: As mentioned above, she has a neurologist she has been visiting due to dizzy spells. She had a CVA that had significant right-sided weakness that has improved after therapy. The patient is having residual minimal weakness on the right side.  PSYCHIATRIC: Positive history of depression and anxiety.   PAST MEDICAL HISTORY: 1.  Coronary artery disease with medical treatment.  2.  Hyperlipidemia.  3.  Hypertension.  4.  Recurrent urinary tract infections.  5.  Previous tobacco abuse, now quit about a year ago.  6.  Migraine.  7.  Depression and anxiety.   PAST SURGICAL HISTORY:  1.  Left knee surgery due to arthritis.  2.  Bilateral tubal ligation with oophorectomy.  3.  Cardiac catheterization.    ALLERGIES: The patient is allergic to statins and penicillin.  CURRENT MEDICATIONS: Plavix 75 mg daily, Protonix 40 mg daily, metoprolol ER 25 mg daily, Macrobid once a day, isosorbide mononitrate 60 mg once a day, hydrochlorothiazide with triamterene 25/325 mg once  daily, fluoxetine 40 mg daily, atorvastatin 40 mg daily.   FAMILY HISTORY: Negative for coronary artery disease in the family. Actually no heart attacks. Positive CVA in her father and sister. Hypertension. Otherwise no other medical conditions.   PHYSICAL EXAMINATION: VITAL  SIGNS: Blood pressure 128/68, pulse 67, respiratory rate 18, temperature 98.2, oxygen saturation 94% on room air.  GENERAL: The patient is alert, oriented x3, in no acute distress. No respiratory distress. Hemodynamically stable.  HEENT: Her pupils are equal and reactive. Extraocular movements are intact. Mucosae are moist. Anicteric sclerae. Pink conjunctivae. No oral lesions. No oropharyngeal exudates.  NECK: Supple. No JVD. No thyromegaly. No adenopathy. No carotid bruits. No rigidity.  CARDIOVASCULAR: Regular rate and rhythm. No murmurs, rubs or gallops are appreciated at this moment. No displacement of PMI. Positive tenderness to palpation into the presternal area. The patient says that she has not been doing any exercise, but the husband is telling me that actually they moved and she had to carry a lot of boxes. She did not have any chest pain carrying those boxes, for which the possibility of muscular pain is present. Pain is reproducible to some degree.  LUNGS: Showing mild crepitations on both bases. No use of accessory muscles. No rales. The patient has bilateral atelectasis on the bases, no signs of congestive heart failure.  ABDOMEN: Soft, nontender, nondistended. No hepatosplenomegaly. No masses. Bowel sounds are positive.  EXTREMITIES: No edema, cyanosis or clubbing.  SKIN: No rashes or petechiae.  MUSCULOSKELETAL: No joint effusions or joint deformity.  PSYCHIATRIC: No significant agitation. The patient is oriented x3. Flat affect is positive.  NEUROLOGIC: Cranial nerves II through XII intact. Strength is 5/5 in all four extremities. I do not see any residual from her previous stroke, although the patient states that she feels weaker on the right side than on the left.  SKIN: No rash or petechiae.  LYMPHATIC: Negative for lymphadenopathy in the neck or supraclavicular areas.   LABORATORY RESULTS: EKG: The patient has normal sinus rhythm with QT prolongation, some T-wave inversions on  the septal leads and lateral leads. Her cardiac enzymes on the first set are negative. Previous echocardiogram done in May 2014 showed left ventricular ejection fraction is 30% to 34%, global systolic dysfunction, tricuspid regurgitation. INR is 1. White count is 5.9, hemoglobin 12.0. Sodium is 136, creatinine 1.06. Her baseline creatinine is around 0.6 to 0.5. Her GFR is around 59. Chest x-ray: No acute abnormalities other than mild atelectasis on both bases that correlates with crackling heard earlier.   ASSESSMENT AND PLAN: A 59 year old female with history of coronary artery disease, hypertension, hyperlipidemia, who comes with acute chest pain.  1.  Acute coronary syndrome/unstable angina. At this moment, therapy is going to be started with a heparin drip, aspirin, nitroglycerin and a beta blocker. Continue Plavix and isosorbide. Follow-up EKG. Cardiology consulted for the morning. At this moment, we are going to trend cardiac enzymes, as the patient is having significant chest pain. Continue metoprolol as a beta blocker, monitor closely for hypotension. Possible catheterization.  2.  History of coronary artery disease, as mentioned above.  3.  Hypertension. Continue metoprolol. 4.  Hyperlipidemia. Patient intolerant to statins, for which were are not going to be able to prescribe. Check lipid levels. The patient is stable right now. Is still having some pain. We are going to change nitroglycerin to nitro paste and monitor closely.  5.  As far as her potassium, we will replace it IV and orally.  6.  Deep vein thrombosis prophylaxis, heparin drip.  7.  Gastrointestinal prophylaxis, Protonix.   TIME SPENT: I spent about 45 minutes with this patient.     ____________________________ [place] Sink, MD rsg:cg D: 12/31/2013 00:21:40 ET T: 12/31/2013 00:47:25 ET JOB#: [id number]  cc: [place] Sink, MD, <Dictator> Thomasenia Dowse America Brown MD ELECTRONICALLY SIGNED 01/06/2014  11:13

## 2015-03-02 NOTE — Consult Note (Signed)
PATIENT NAME:  [name], [name] MR#:  [id number] DATE OF BIRTH:  10/02/1956  DATE OF CONSULTATION:  12/31/2013  REFERRING PHYSICIAN:  Adrian Prows, MD CONSULTING PHYSICIAN:  Zyan Mirkin D. Clayborn Bigness, MD  CARDIOLOGIST: Isaias Cowman, MD  INDICATION: Chest pain, possible unstable angina.   HISTORY OF PRESENT ILLNESS: She was admitted by [facility], Dr. Laurin Coder. The patient is a 59 year old white female with known coronary artery disease, history of myocardial infarction in the past, hypertension, hyperlipidemia, former smoker. Last cath was back in May. She was found to have significant coronary artery disease but adequate collaterals so she was treated medically. She has had recurrent atypical what appears to be noncardiac chest wall pain, which has been evaluated by functional studies. She has done reasonably well but having again recurrent atpical chest pain symptoms. On this admission, she had severe 10/10 which prompted her to come to the Emergency Room.  TEXT>> some radiation to the back and shoulders, none to the arms or the neck. She took aspirin, took 2 sublingual nitroglycerine which improved the pain from a 9 down to a 6, but then finally came to the Emergency Room for evaluation. EKG in the Emergency Room was non-diagnostic. Pain was as low as a 4. She denied any other symptoms. She recently had a Holter placed in [place]. The results are still pending. She still has some vertigo symptoms, lightheadedness, and occasional atypical chest pain symptoms so subsequently was admitted for further evaluation and care.   REVIEW OF SYSTEMS: Denies blackout spells or syncope. No significant nausea or vomiting. No fever. No chills. No sweats. No weight loss. No weight gain, hemoptysis, hematemesis. Denies bright red blood per rectum. No vision change or hearing change. Denies sputum production. Denies cough. She still has some chest pain symptoms and mild vertigo.   PAST MEDICAL HISTORY: Coronary  artery disease, hyperlipidemia, hypertension, recurrent urinary tract infections, previous tobacco abuse, migraines, depression, anxiety.   PAST SURGICAL HISTORY: Left knee surgery, bilateral tubal ligation, oophorectomy, cardiac catheterization.   MEDICATIONS: Right now she is on Plavix 75 mg a day, Protonix 40 a day, metoprolol ER 25 a day, Macrobid 100 once a day, Imdur 60 mg once a day, HCTZ/triamterene 25/325 once a day, fluoxetine 40 mg a day, Lipitor 40 a day.   FAMILY HISTORY: Possible CVA and hypertension, otherwise negative.   SOCIAL HISTORY: Married. Quit smoking. Denies alcohol consumption.   PHYSICAL EXAMINATION: VITAL SIGNS: Blood pressure is 130/70, pulse 65, respiratory rate 14, afebrile.  HEENT: Normocephalic, atraumatic. Pupils equal and reactive to light.  NECK: Supple. No significant JVD, bruits or adenopathy.  LUNGS: Clear to auscultation and percussion. No significant wheeze, rhonchi or rale.  HEART: Regular rate and rhythm, soft systolic ejection murmur at the apex. She has some chest wall discomfort with palpation. No significant gallops. No rubs.  ABDOMEN: Benign.  EXTREMITIES: Within normal limits.  NEUROLOGIC: Intact.  SKIN: Normal.   DIAGNOSTICS: EKG: Normal sinus rhythm, slightly prolonged QT, occasional nonspecific ST-T wave changes.   Cardiac enzymes are negative.   Last echo back in May showed an EF of about 35% to 40%.   White count 5.9, hemoglobin 12. Sodium 136. Creatinine 1.06.   Chest x-ray essentially unremarkable.   ASSESSMENT:  1.  Possible unstable angina with acute coronary syndrome.  2.  Possible atypical chest pain symptoms. 3.  Known coronary artery disease.  4.  Hyperlipidemia.  5.  Hypertension. 6.  Past history of smoking.  7.  Mild depression.  8.  Moderate cardiomyopathy.   PLAN: 1.  Agree with admit. Rule out for myocardial infarction. Follow up cardiac enzymes. Follow up EKG. Consider further invasive evaluation. Would  consider repeat catheterization to re-evaluate her coronary arteries. With her extensive collaterals, would consider whether an alternative therapy would be helpful, with her persistent symptoms. I am not convinced that her symptoms are totally cardiac at this point. We might consider an increase in Imdur to 60 twice a day and add in Ranexa also to help with her chest pain symptoms. I think if neither one is effective or both, then would consider alternative evaluation for chest pain symptoms that are noncardiac. 2.  Continue coronary artery disease therapy with aspirin and Plavix. Continue beta blocker, low-dose ACE. 3.  Continue hypertension therapy. 4.  Continue atorvastatin for lipid management.  5.  Continue fluoxetine for depression symptoms.  6.  Continue short-term anticoagulation while she is in the hospital until evaluation is complete.   We will discuss the case with Dr. Saralyn Pilar and consider whether cardiac catheterization is appropriate at this point.  ____________________________ Loran Senters. Clayborn Bigness, MD ddc:sb D: 01/01/2014 08:21:33 ET T: 01/01/2014 09:15:12 ET JOB#: [id number]  cc: Zamzam Whinery D. Clayborn Bigness, MD, <Dictator> Yolonda Kida MD ELECTRONICALLY SIGNED 02/05/2014 7:03

## 2015-03-02 NOTE — H&P (Signed)
PATIENT NAME:  [name], [name] MR#:  [id number] DATE OF BIRTH:  11/29/1955  DATE OF ADMISSION:  05/14/2014  PRIMARY CARE PHYSICIAN: Dr. Ola Spurr   HISTORY OF PRESENT ILLNESS: The patient is a 59 year old Caucasian female with history of coronary artery disease status post stent placed in 95% occlusion of RCA lesion in February 2015, hypertension, CAD and hyperlipidemia who presented to the hospital with complaints of chest pains. According to the patient, she was doing well up until Thursday, a few days ago, when she started having left-sided chest pain. Pain is described as sitting on the chest kind of pain, 10/10 by intensity, accompanied with shortness of breath. Pain is constant, however, with intermittent worsening episodes, especially whenever she walks around. It radiates across her shoulder blades. Also the patient has been noticing cramping in the left arm as well as pain in the left neck area. Her pain would subside whenever she stopped doing her activity. She admits of lightheadedness and dizziness together with this pain, but not feeling presyncopal. She is fatigued and having shortness of breath. She admits to having some palpitations intermittently as well as nausea. Due to this she presented to the Emergency Room for further evaluation. In the Emergency Room, her EKG looked about the same as it was in the past, in February 2015; however, due to worsening pain hospitalist service was contacted for admission.   PAST MEDICAL HISTORY: Significant for coronary artery disease status post stent placed in 95% occlusion of a RCA lesion in February 2015 by Dr. Saralyn Pilar, hypertension, hyperlipidemia, previous smoker, recurrent urinary tract infections, migraine headaches, as well as depression and anxiety.   PAST SURGICAL HISTORY: Left knee surgery for arthritis, bilateral tubal ligation with oophorectomy, and cardiac catheterization.   ALLERGIES: STATIN AS WELL AS PENICILLIN.   MEDICATIONS:  According to medical records, the patient is on aspirin 81 mg p.o. daily, atorvastatin 40 mg p.o. daily, fluoxetine 40 mg p.o. daily, HCTZ/triamterene 25/37.5 mg once daily, isosorbide mononitrate 30 mg p.o. daily, Lasix 40 mg p.o. daily, lorazepam 0.5 mg daily every 6 hours as needed, Macrobid 100 mg p.o. daily, metoprolol succinate extended-release 12.5 mg p.o. daily, nitroglycerin 0.4 mg sublingually as needed for chest pains, pantoprazole 40 mg p.o. twice daily, Plavix 75 mg p.o. daily, ranolazine 500 mg p.o. twice daily, Xanax 1 mg p.o. at bedtime as needed, zolpidem 5 mg p.o. at bedtime as needed.   FAMILY HISTORY: Negative for coronary artery disease. No heart attacks. Positive for stroke in the patient's father as well as sister. Hypertension and no other medical problems.  SOCIAL HISTORY: Lives with her husband. No smoking, quit approximately a year ago. No alcohol abuse.  REVIEW OF SYSTEMS: As above. Positive for fatigue and weakness, pains in the chest, shortness of breath as well as arrhythmias, palpitations, dyspnea on exertion, as well as nausea. CONSTITUTIONAL: Denies any high fevers or chills, weight loss or gain.  EYES: Denies any blurry vision, double vision, glaucoma or cataracts.  ENT: Denies any tinnitus, allergies, epistaxis, sinus pain, dentures, difficulty swallowing.  RESPIRATORY: Denies any cough, wheezes, asthma, COPD. CARDIOVASCULAR: Denies any orthopnea, edema, or syncope.  GASTROINTESTINAL: Denies any vomiting, diarrhea or constipation.  GENITOURINARY: Denies dysuria, hematuria, or frequency. ENDOCRINOLOGIST: Denies any polydipsia, nocturia, thyroid problems, heat or cold intolerance or thirst.  HEMATOLOGIC: Denies anemia, easy bruising, bleeding or swollen glands.  SKIN: Denies any acne, rashes, lesions or change in moles.  MUSCULOSKELETAL: Denies arthritis, cramps, or swelling. NEUROLOGIC: Denies numbness, epilepsy or tremor. PSYCH:  Denies any anxiety or  depression.  PHYSICAL EXAMINATION: VITAL SIGNS: On arrival to the hospital, the patient's temperature was 98.3, pulse 65, respiration 20, blood pressure 133/70, and saturation 98% on room air.  GENERAL: This is a well-developed, well-nourished Caucasian female in no significant distress lying on the stretcher.  HEENT: Pupils are equal and reactive to light. Extraocular movements intact. , Some tenderness or.  Is moist.   NECK: No masses. Supple, nontender. Thyroid is not enlarged. No adenopathy. No JVD or carotid bruits bilaterally. Full range of motion.   LUNGS: Clear to auscultation in all fields. No rales, rhonchi, diminished breath sounds or wheezing. No labored inspiration, increased effort, dullness to percussion or overt respiratory distress.   CARDIOVASCULAR: S1 is a well-developed otherwise, chest is nontender to palpation.   EXTREMITIES: 1+ pedal pulses.   EXTREMITIES: No lower extremity edema, calf tenderness or cyanosis was noted.   ABDOMEN: Soft, nontender. Bowel sounds are present. No icterus or conjunctivitis. Has normal hearing. No pharynx erythema. Mucosa is moist. NECK: No masses, supple and nontender. Thyroid not enlarged. No adenopathy, JVD or carotid bruits bilaterally. Full range of motion. LUNGS: Clear to auscultation in all fields. No rales, rhonchi, diminished breath sounds or wheezing. No labored inspiration, increased effort, dullness to percussion or respiratory distress. CARDIOVASCULAR: S1 and S2 appreciated. Rhythm was regular. PMI not lateralized. Chest is nontender to palpation.  EXTREMITIES: 1+ pedal pulses. No lower extremity edema, calf tenderness or cyanosis was noted.  ABDOMEN: Soft, nontender. Bowel sounds are present. No hepatosplenomegaly or masses were noted.  RECTAL: Deferred.  MUSCULOSKELETAL: Muscle strength: able to move all extremities. No cyanosis, degenerative joint disease or kyphosis. Gait was not tested.  SKIN: Did not reveal any rashes,  lesions, erythema, nodularity or induration. It was warm and dry to palpation.  LYMPHATIC: No adenopathy in the cervical region.  NEUROLOGIC: Cranial nerves grossly intact. Sensory is intact. No dysarthria or aphasia. The patient is alert and oriented to person and place, cooperative. Memory is good.  PSYCHIATRIC: No significant confusion, agitation, or depression noted.  DIAGNOSTIC DATA: EKG done on the 6th of July 2015 showed normal sinus rhythm at 68 beats per minute, normal axis, ST depressions in anterior leads, prolonged QTc to 474 ms, but no acute ST-T changes were noted. No change since prior EKG was noted.   BMP showed BUN of 19 and glucose 144, otherwise BMP was unremarkable. Troponin is less 0.02. CBC: White blood cell count 5.9, hemoglobin 12.8, platelet count 236,000.   Chest x-ray, portable single view, on the 6th of July 2015, showed no acute cardiopulmonary abnormality.   ASSESSMENT AND PLAN: 1. Unstable angina. Admit the patient to the medical floor. Continue her on beta blockers and aspirin as well as Plavix and nitroglycerin topically. Continue oxygen therapy. Check cardiac enzymes x3. Get cardiology consultation. According to Dr. Donivan Scull discussion with Dr. Jacqualine Code, very likely cardiac catheterization tomorrow with Dr. Rockey Situ.  2.  Hyperglycemia. Get hemoglobin A1c. 3.  Hypertension. Continue home medications. Seems to be satisfactorily controlled.  4.  Hyperlipidemia. Continue Lipitor. Lipid panel is in the morning to be checked.   TIME SPENT: 50 minutes.  ____________________________ Theodoro Grist, MD rv:sb D: 05/14/2014 15:06:17 ET T: 05/14/2014 17:19:34 ET JOB#: [id number]  cc: Theodoro Grist, MD, <Dictator> Cheral Marker. Ola Spurr, MD Theodoro Grist MD ELECTRONICALLY SIGNED 05/31/2014 16:25

## 2015-06-03 ENCOUNTER — Encounter: Payer: Self-pay | Admitting: Cardiovascular Disease

## 2015-06-03 ENCOUNTER — Ambulatory Visit (INDEPENDENT_AMBULATORY_CARE_PROVIDER_SITE_OTHER): Payer: BLUE CROSS/BLUE SHIELD | Admitting: Cardiovascular Disease

## 2015-06-03 VITALS — BP 118/76 | HR 78 | Ht 62.0 in | Wt 133.5 lb

## 2015-06-03 DIAGNOSIS — R079 Chest pain, unspecified: Secondary | ICD-10-CM | POA: Diagnosis not present

## 2015-06-03 DIAGNOSIS — I2510 Atherosclerotic heart disease of native coronary artery without angina pectoris: Secondary | ICD-10-CM | POA: Diagnosis not present

## 2015-06-03 DIAGNOSIS — E785 Hyperlipidemia, unspecified: Secondary | ICD-10-CM | POA: Diagnosis not present

## 2015-06-03 DIAGNOSIS — I779 Disorder of arteries and arterioles, unspecified: Secondary | ICD-10-CM

## 2015-06-03 DIAGNOSIS — I739 Peripheral vascular disease, unspecified: Secondary | ICD-10-CM

## 2015-06-03 DIAGNOSIS — I10 Essential (primary) hypertension: Secondary | ICD-10-CM

## 2015-06-03 DIAGNOSIS — Z720 Tobacco use: Secondary | ICD-10-CM

## 2015-06-03 MED ORDER — NITROGLYCERIN 0.4 MG SL SUBL: 0.4000 mg | SUBLINGUAL_TABLET | SUBLINGUAL | Status: DC | PRN

## 2015-06-03 NOTE — Assessment & Plan Note (Signed)
Blood pressure is well controlled on today's visit. No changes made to the medications. 

## 2015-06-03 NOTE — Assessment & Plan Note (Signed)
Mild bilateral carotid arterial disease. Workup done October 2014 after stroke

## 2015-06-03 NOTE — Patient Instructions (Signed)
You are doing well. No medication changes were made.  Please call the office if you have more chest pain symptoms and taking more nitro  Please call us if you have new issues that need to be addressed before your next appt.  Your physician wants you to follow-up in: 6 months.  You will receive a reminder letter in the mail two months in advance. If you don't receive a letter, please call our office to schedule the follow-up appointment.

## 2015-06-03 NOTE — Assessment & Plan Note (Signed)
Recent symptoms somewhat atypical in nature. Discussed various treatment options with her. Recommended as her EKG is unchanged, symptoms somewhat atypical, that we monitor her symptoms for now. She is in agreement with this.. If she has recurrent symptoms requiring nitroglycerin, recommended that she call us.  Options at that time would include treadmill stress test. If unable to perform this as she has severe knee disease, could order dobutamine stress test She is indicated she does not want a lexiscan given prior reaction

## 2015-06-03 NOTE — Assessment & Plan Note (Signed)
Cholesterol is at goal on the current lipid regimen. No changes to the medications were made.  

## 2015-06-03 NOTE — Progress Notes (Signed)
Patient ID: [name], female    DOB: [birth year], 59 y.o.   MRN: [id number]  HPI Comments: [name] is a 58 year old woman with long history of smoking who stopped in May 2014, coronary artery disease with non-ST elevation in may 2015 with stent placed to her distal RCA, stroke October 2014 evaluated at [facility]. She presents for routine followup of her coronary artery disease  In follow-up today, she reports that last week on Wednesday after a very stressful day, she had some chest tightness after dinner. He was a sharp pain. She took aspirin with no relief, then took nitroglycerin with improvement of her symptoms. No further episodes since that time She is otherwise active, denies having regular chest pain symptoms A day was particular stressful as they were in the emergency room with family member  She has severe ostio arthritis of the knee, bone-on-bone, receiving cartilage shots Spending some time at the beach Tolerating her cholesterol medication, now on Lipitor 80 mg daily  Review of lab work from April 2016 shows total cholesterol 156, LDL 69  EKG on today's visit shows normal sinus rhythm with rate 78 bpm, diffuse T-wave abnormality through the anterior precordial leads  Other past medical history  hospitalization 12/31/2013 where she presented with angina. He had a catheterization with distal RCA stent placed.  Also started on ranexa, continued on isosorbide.   Previous Cardiac catheterization report 01/01/2014 details 95% distal RCA lesion with stent placed. Also had 40% mid LAD, 30% distal LAD, 80% D1 disease, 60% D2 disease, 50% ostial RCA disease, 50% proximal RCA disease, 60% mid RCA disease stent placed was a xience  EX 2.5 millimeter stent  Cardiac catheterization in 03/16/2013 details occluded mid RCA with collaterals from distal LAD also 75% stenosis diagonal #1 near the ostium Ejection fraction at that time was 46% notes also detailed 40% mid LAD, 30% distal  LAD, 50% D2 disease, 30% proximal circumflex, 75% proximal RCA disease  Echocardiogram 03/16/2013 with ejection fraction 30-35%, moderate to severe mitral valve regurgitation, moderate tricuspid valve regurgitation .  echocardiogram October 2014 following her stroke showed relatively normal ejection fraction estimated at least greater than 55% with no significant valvular regurgitation  Allergies  Allergen Reactions  . Codeine Sulfate   . Penicillins Nausea And Vomiting    Current Outpatient Prescriptions on File Prior to Visit  Medication Sig Dispense Refill  . acetaminophen (TYLENOL) 500 MG tablet Take 1,000 mg by mouth every 6 (six) hours as needed for mild pain or moderate pain.    Marland Kitchen aspirin 81 MG tablet Take 81 mg by mouth daily.    . clopidogrel (PLAVIX) 75 MG tablet TAKE 1 TABLET BY MOUTH DAILY 90 tablet 2  . DULoxetine (CYMBALTA) 60 MG capsule Take 60 mg by mouth daily.    . furosemide (LASIX) 20 MG tablet Take 20 mg by mouth daily as needed for edema.     . isosorbide mononitrate (IMDUR) 60 MG 24 hr tablet Take 60 mg by mouth daily.    . meclizine (ANTIVERT) 25 MG tablet Take 1 tablet (25 mg total) by mouth 3 (three) times daily as needed. 90 tablet 1  . metoprolol succinate (TOPROL-XL) 25 MG 24 hr tablet Take 12.5 mg by mouth every morning.    . pantoprazole (PROTONIX) 40 MG tablet Take 40 mg by mouth every 12 (twelve) hours.    . ranolazine (RANEXA) 500 MG 12 hr tablet Take 1 tablet (500 mg total) by mouth 2 (two) times  daily. 180 tablet 3  . sertraline (ZOLOFT) 100 MG tablet Take 100 mg by mouth daily.    Marland Kitchen zolpidem (AMBIEN) 10 MG tablet Take 10 mg by mouth at bedtime as needed for sleep.     No current facility-administered medications on file prior to visit.    Past Medical History  Diagnosis Date  . Hypertension   . Hypercholesteremia   . CAD (coronary artery disease)     a. 03/2013 NSTEMI/Cath: 100 RCA, EF 45%->Med Rx;  b. 12/2013 Cath/PCI: LAD 1m, 30d, D1 80, D2 60,  LCX nl, RCA 50ost/p, 45m, 95d (2.5x33 Xience DES);  c. 05/2014 Lexi MV: EF 68%, no ischemia.  Marland Kitchen CVA (cerebral vascular accident)     a. 08/2013.  . Tobacco abuse   . Anxiety   . Depression   . Migraines   . Ischemic cardiomyopathy     a. 03/2013 Echo: EF 30-35%, mod dil LA, mod-sev MR, mod TR;  b. 08/2013 Echo EF 60-65%, mild AI.  Marland Kitchen Syncope and collapse   . Carotid disease, bilateral     a. 08/2013 Carotid U/S: 1-39% bilat ICA stenosis.    Past Surgical History  Procedure Laterality Date  . Oophorectomy    . Cesarean section    . Cesarean section with bilateral tubal ligation    . Knee arthroscopy  11/27/2006    left knee   . Cardiac catheterization  01/01/2014  . Coronary angioplasty with stent placement  01/01/2014    95% lesion with a drug eluting stent to the distal RCA.  . Cardiac catheterization  03/16/2013  . Cardiac catheterization  12/2013    Social History  reports that she quit smoking about 2 years ago. Her smoking use included Cigarettes. She has a 5 pack-year smoking history. She does not have any smokeless tobacco history on file. She reports that she does not drink alcohol or use illicit drugs.  Family History family history includes Hyperlipidemia in her mother; Hypertension in her mother; Stroke in her mother.       Review of Systems  Constitutional: Negative.   Respiratory: Positive for chest tightness.   Cardiovascular: Positive for chest pain.  Gastrointestinal: Negative.   Musculoskeletal: Negative.   Skin: Negative.   Allergic/Immunologic: Negative.   Neurological: Negative.        Spinning, nausea, vomiting  Hematological: Negative.   Psychiatric/Behavioral: Negative.   All other systems reviewed and are negative.   BP 118/76 mmHg  Pulse 78  Ht 5\' 2"  (1.575 m)  Wt 133 lb 8 oz (60.555 kg)  BMI 24.41 kg/m2  Physical Exam  Constitutional: She is oriented to person, place, and time. She appears well-developed and well-nourished.  HENT:   Head: Normocephalic.  Nose: Nose normal.  Mouth/Throat: Oropharynx is clear and moist.  Eyes: Conjunctivae are normal. Pupils are equal, round, and reactive to light.  Neck: Normal range of motion. Neck supple. No JVD present.  Cardiovascular: Normal rate, regular rhythm, S1 normal, S2 normal, normal heart sounds and intact distal pulses.  Exam reveals no gallop and no friction rub.   No murmur heard. Pulmonary/Chest: Effort normal and breath sounds normal. No respiratory distress. She has no wheezes. She has no rales. She exhibits no tenderness.  Abdominal: Soft. Bowel sounds are normal. She exhibits no distension. There is no tenderness.  Musculoskeletal: Normal range of motion. She exhibits no edema or tenderness.  Lymphadenopathy:    She has no cervical adenopathy.  Neurological: She is alert and oriented to  person, place, and time. Coordination normal.  Skin: Skin is warm and dry. No rash noted. No erythema.  Psychiatric: She has a normal mood and affect. Her behavior is normal. Judgment and thought content normal.    Assessment and Plan  Nursing note and vitals reviewed.

## 2015-06-03 NOTE — Assessment & Plan Note (Signed)
We have encouraged her to continue to work on weaning her cigarettes and smoking cessation. She will continue to work on this and does not want any assistance with chantix.  

## 2015-07-08 ENCOUNTER — Telehealth: Payer: Self-pay

## 2015-07-08 NOTE — Telephone Encounter (Signed)
Received call from Dr. Cephus Shelling that pt is in her office now and she is concerned that pt may be having syncopal or near-syncopal episodes.  Dr. Rockey Situ spoke w/ Dr. Nicolasa Ducking.  Pt refused to come down for evaluation.  Dr. Rockey Situ advised to have pt hydrate, change positions slowly and decrease isosorbide to 30 mg (cut 60 mg in 1/2) and monitor BP at home.  Asked her to have pt call the office later this week w/ BP readings.

## 2015-07-09 NOTE — Telephone Encounter (Signed)
BP this morning 120/62.   Wants to be seen .   Per Patient Dr. Ola Spurr says patient needs to been this week.

## 2015-07-10 NOTE — Telephone Encounter (Signed)
You don't have any openings today or tomorrow (you're Rounds Only on Friday). Any suggestions?

## 2015-07-11 ENCOUNTER — Ambulatory Visit (INDEPENDENT_AMBULATORY_CARE_PROVIDER_SITE_OTHER): Payer: BLUE CROSS/BLUE SHIELD | Admitting: Cardiovascular Disease

## 2015-07-11 ENCOUNTER — Encounter: Payer: Self-pay | Admitting: Cardiovascular Disease

## 2015-07-11 VITALS — BP 106/70 | HR 68 | Ht 62.0 in | Wt 134.0 lb

## 2015-07-11 DIAGNOSIS — Z720 Tobacco use: Secondary | ICD-10-CM

## 2015-07-11 DIAGNOSIS — I951 Orthostatic hypotension: Secondary | ICD-10-CM | POA: Diagnosis not present

## 2015-07-11 DIAGNOSIS — R55 Syncope and collapse: Secondary | ICD-10-CM

## 2015-07-11 DIAGNOSIS — I2510 Atherosclerotic heart disease of native coronary artery without angina pectoris: Secondary | ICD-10-CM | POA: Diagnosis not present

## 2015-07-11 DIAGNOSIS — I10 Essential (primary) hypertension: Secondary | ICD-10-CM

## 2015-07-11 DIAGNOSIS — E785 Hyperlipidemia, unspecified: Secondary | ICD-10-CM

## 2015-07-11 MED ORDER — EZETIMIBE 10 MG PO TABS: 10.0000 mg | ORAL_TABLET | Freq: Every day | ORAL | Status: DC

## 2015-07-11 NOTE — Assessment & Plan Note (Signed)
We have encouraged her to continue to work on weaning her cigarettes and smoking cessation. She will continue to work on this and does not want any assistance with chantix.  

## 2015-07-11 NOTE — Assessment & Plan Note (Signed)
Recent episodes of syncope, orthostasis  Causing the burning in her legs is unclear, possibly after recent shots to her knees  Recommended she drink fluids, hold the isosorbide for now  if symptoms persist, could wear compression hose

## 2015-07-11 NOTE — Assessment & Plan Note (Addendum)
Low blood pressure recently, we'll hold the isosorbide given her orthostasis

## 2015-07-11 NOTE — Assessment & Plan Note (Signed)
2 episodes of syncope after urination  Daily episodes of  Leg burning, followed by, lightheadedness , possibly vasovagal episodes  Recommended she hold the isosorbide ,  Fluid hydration  Call us if symptoms persist

## 2015-07-11 NOTE — Patient Instructions (Addendum)
Please hold the isosorbide for now Call the office next week and let me know if symtoms have  Call when side affect of knee shots has worn off  Please start zetia one a day for cholesterol  Please call us if you have new issues that need to be addressed before your next appt.  Your physician wants you to follow-up in: 6 months.  You will receive a reminder letter in the mail two months in advance. If you don't receive a letter, please call our office to schedule the follow-up appointment.

## 2015-07-11 NOTE — Assessment & Plan Note (Signed)
Cholesterol is at goal on the current lipid regimen. No changes to the medications were made.  

## 2015-07-11 NOTE — Telephone Encounter (Signed)
Had cancellation this am.  Pt worked in to see Dr. Rockey Situ @ 8:40.

## 2015-07-11 NOTE — Assessment & Plan Note (Signed)
Currently with no symptoms of angina. No further workup at this time. Continue current medication regimen. 

## 2015-07-11 NOTE — Progress Notes (Signed)
Patient ID: [name], female    DOB: 03/29/1956, 59 y.o.   MRN: [id number]  HPI Comments: [name] is a 59 year old woman with long history of smoking who stopped in May 2014, coronary artery disease with non-ST elevation in may 2015 with stent placed to her distal RCA, stroke October 2014 evaluated at [facility]. She presents for routine followup of her coronary artery disease  In follow-up, she reports having cartilage injections into her knees bilaterally starting proximally 3 weeks ago beginning of August 2016. Since then she has had almost daily episodes of burning starting in her feet extending up her legs up to her head. Often followed by nausea, occasionally with vomiting. Symptoms would last up to 5-10 minutes She reports having 2 episodes of syncope. Both episodes occurred after urinating in the bathroom. She was found to have fallen off the toilet, found by her husband. She has lightheaded spells when these episodes occur, worse with standing. If she sits very still, tenses her body, typically the way of symptoms will resolve.  She denies any significant symptoms of chest pain, arm pain concerning for angina She did go to her therapist recently and it was  recommended she decrease her isosorbide down to 0.5 mg daily  Orthostatics done in the office today show drop in her systolic pressure from 696 down to 114 with standing, on a lower dose isosorbide, heart rate 70  EKG on today's visit shows normal sinus rhythm with rate 68 bpm, diffuse T-wave abnormalities to the anterior precordial leads  Other past medical history She has severe ostio arthritis of the knee, bone-on-bone Tolerating her cholesterol medication,  on Lipitor 80 mg daily  Review of lab work from April 2016 shows total cholesterol 156, LDL 69   hospitalization 12/31/2013 where she presented with angina. He had a catheterization with distal RCA stent placed.  Also started on ranexa, continued on isosorbide.    Previous Cardiac catheterization report 01/01/2014 details 95% distal RCA lesion with stent placed. Also had 40% mid LAD, 30% distal LAD, 80% D1 disease, 60% D2 disease, 50% ostial RCA disease, 50% proximal RCA disease, 60% mid RCA disease stent placed was a xience  EX 2.5 millimeter stent  Cardiac catheterization in 03/16/2013 details occluded mid RCA with collaterals from distal LAD also 75% stenosis diagonal #1 near the ostium Ejection fraction at that time was 46% notes also detailed 40% mid LAD, 30% distal LAD, 50% D2 disease, 30% proximal circumflex, 75% proximal RCA disease  Echocardiogram 03/16/2013 with ejection fraction 30-35%, moderate to severe mitral valve regurgitation, moderate tricuspid valve regurgitation .  echocardiogram October 2014 following her stroke showed relatively normal ejection fraction estimated at least greater than 55% with no significant valvular regurgitation     Allergies  Allergen Reactions  . Codeine Sulfate   . Penicillins Nausea And Vomiting    Current Outpatient Prescriptions on File Prior to Visit  Medication Sig Dispense Refill  . acetaminophen (TYLENOL) 500 MG tablet Take 1,000 mg by mouth every 6 (six) hours as needed for mild pain or moderate pain.    Marland Kitchen aspirin 81 MG tablet Take 81 mg by mouth daily.    Marland Kitchen atorvastatin (LIPITOR) 80 MG tablet Take 80 mg by mouth daily.     . clopidogrel (PLAVIX) 75 MG tablet TAKE 1 TABLET BY MOUTH DAILY 90 tablet 2  . DULoxetine (CYMBALTA) 60 MG capsule Take 60 mg by mouth daily.    . furosemide (LASIX) 20 MG tablet Take  20 mg by mouth daily as needed for edema.     . isosorbide mononitrate (IMDUR) 60 MG 24 hr tablet Take 30 mg by mouth daily.     . meclizine (ANTIVERT) 25 MG tablet Take 1 tablet (25 mg total) by mouth 3 (three) times daily as needed. 90 tablet 1  . metoprolol succinate (TOPROL-XL) 25 MG 24 hr tablet Take 12.5 mg by mouth every morning.    . nitroGLYCERIN (NITROSTAT) 0.4 MG SL tablet Place 1  tablet (0.4 mg total) under the tongue every 5 (five) minutes as needed for chest pain. 25 tablet 6  . pantoprazole (PROTONIX) 40 MG tablet Take 40 mg by mouth every 12 (twelve) hours.    . ranolazine (RANEXA) 500 MG 12 hr tablet Take 1 tablet (500 mg total) by mouth 2 (two) times daily. 180 tablet 3  . REXULTI 1 MG TABS Take 1 mg by mouth daily.     . sertraline (ZOLOFT) 100 MG tablet Take 100 mg by mouth daily.    Marland Kitchen zolpidem (AMBIEN) 10 MG tablet Take 10 mg by mouth at bedtime as needed for sleep.     No current facility-administered medications on file prior to visit.    Past Medical History  Diagnosis Date  . Hypertension   . Hypercholesteremia   . CAD (coronary artery disease)     a. 03/2013 NSTEMI/Cath: 100 RCA, EF 45%->Med Rx;  b. 12/2013 Cath/PCI: LAD 61m, 30d, D1 80, D2 60, LCX nl, RCA 50ost/p, 67m, 95d (2.5x33 Xience DES);  c. 05/2014 Lexi MV: EF 68%, no ischemia.  Marland Kitchen CVA (cerebral vascular accident)     a. 08/2013.  . Tobacco abuse   . Anxiety   . Depression   . Migraines   . Ischemic cardiomyopathy     a. 03/2013 Echo: EF 30-35%, mod dil LA, mod-sev MR, mod TR;  b. 08/2013 Echo EF 60-65%, mild AI.  Marland Kitchen Syncope and collapse   . Carotid disease, bilateral     a. 08/2013 Carotid U/S: 1-39% bilat ICA stenosis.    Past Surgical History  Procedure Laterality Date  . Oophorectomy    . Cesarean section    . Cesarean section with bilateral tubal ligation    . Knee arthroscopy  11/27/2006    left knee   . Cardiac catheterization  01/01/2014  . Coronary angioplasty with stent placement  01/01/2014    95% lesion with a drug eluting stent to the distal RCA.  . Cardiac catheterization  03/16/2013  . Cardiac catheterization  12/2013    Social History  reports that she quit smoking about 2 years ago. Her smoking use included Cigarettes. She has a 5 pack-year smoking history. She does not have any smokeless tobacco history on file. She reports that she does not drink alcohol or use  illicit drugs.  Family History family history includes Hyperlipidemia in her mother; Hypertension in her mother; Stroke in her mother.   Review of Systems  Constitutional: Negative.   Respiratory: Negative.   Cardiovascular: Negative.   Gastrointestinal: Negative.   Musculoskeletal: Positive for arthralgias.       Leg burning  Skin: Negative.   Neurological: Positive for dizziness, syncope and light-headedness.  Hematological: Negative.   Psychiatric/Behavioral: Negative.   All other systems reviewed and are negative.  BP 106/70 mmHg  Pulse 68  Ht 5\' 2"  (1.575 m)  Wt 134 lb (60.782 kg)  BMI 24.50 kg/m2  Physical Exam  Constitutional: She is oriented to person, place, and  time. She appears well-developed and well-nourished.  HENT:  Head: Normocephalic.  Nose: Nose normal.  Mouth/Throat: Oropharynx is clear and moist.  Eyes: Conjunctivae are normal. Pupils are equal, round, and reactive to light.  Neck: Normal range of motion. Neck supple. No JVD present.  Cardiovascular: Normal rate, regular rhythm, normal heart sounds and intact distal pulses.  Exam reveals no gallop and no friction rub.   No murmur heard. Pulmonary/Chest: Effort normal and breath sounds normal. No respiratory distress. She has no wheezes. She has no rales. She exhibits no tenderness.  Abdominal: Soft. Bowel sounds are normal. She exhibits no distension. There is no tenderness.  Musculoskeletal: Normal range of motion. She exhibits no edema or tenderness.  Lymphadenopathy:    She has no cervical adenopathy.  Neurological: She is alert and oriented to person, place, and time. Coordination normal.  Skin: Skin is warm and dry. No rash noted. No erythema.  Psychiatric: She has a normal mood and affect. Her behavior is normal. Judgment and thought content normal.

## 2015-07-25 ENCOUNTER — Encounter: Payer: Self-pay | Admitting: Neurology

## 2015-07-25 ENCOUNTER — Ambulatory Visit (INDEPENDENT_AMBULATORY_CARE_PROVIDER_SITE_OTHER): Payer: BLUE CROSS/BLUE SHIELD | Admitting: Neurology

## 2015-07-25 VITALS — BP 107/71 | HR 81 | Ht 62.0 in | Wt 136.4 lb

## 2015-07-25 DIAGNOSIS — I6990 Unspecified sequelae of unspecified cerebrovascular disease: Secondary | ICD-10-CM | POA: Diagnosis not present

## 2015-07-25 NOTE — Progress Notes (Signed)
[facility] [address] [place] [address] [phone]       OFFICE FOLLOW-UP NOTE  [name] Date of Birth:  Oct 09, 1956 Medical Record Number:  [id number]   HPI: 76 year Caucasian lady  seen for first office followup visit after finishing enrollment in the POINT trial  in January. She initially presented to [facility] on 10/2/4 and right-sided weakness, dizziness. She was transferred from her workplace to urgent care where they noticed some  EKG changes and sent her to [place] where she noticed decreased sensation and strength in the right arm and leg. She had some decreased sensation on the right face as well. Her NIH stroke scale was 2 she was not given TPA because she was out of the time window and symptoms were mild. She was enrolled in the POINT trial of aspirin plus placebo versus aspirin and Plavix for secondary stroke prevention. MRI scan of the brain did not reveal any acute left brain infarct but did show silent right basal ganglia infarct. MRA of the brain did not show large vessel stenosis. Carotid ultrasound showed no significant extracranial stenosis. Transthoracic echo showed normal ejection fraction. Urine drug screen was negative. Hemoglobin A1c was 5.8. Cholesterol was significant only for LDL over 104 mg percent. She was started honk point study medication In discharge home and did well. She finished enrollment in the study and when seen on the 90 day followup said that measures 0. She however has had some memory and cognitive difficulties which she states are persisted. She has long-standing history of depression and is on Prozac but feels her symptoms are still significant. She wants to refer to neuropsychologist. Her primary care physician did increase Prozac to 40 mg a day but she has not noted any obvious benefit. She was found to have 90% restenosis of a cardiac stent and underwent another cardiac stent in [place] 2 weeks  ago. She however is not happy with her cardiologist and plans to change to her new cardiologist Dr. Candis Musa soon. She is currently on medical leave till June 5 due to her cardiac condition. Update 07/24/14 : She returns for followup last visit 6 months ago. She continues to do well from a neurovascular standpoint without recurrent stroke or TIA symptoms. She has successfully quit smoking. She states her blood pressure is quite well controlled and today it is 108/7 for an office. She is tolerating Lipitor well without side effects. She remains on aspirin and Plavix. She is currently seeing a psychiatrist in [place] Dr. Jake Michaelis who has changed her Prozac to Cymbalta and Zoloft but she feels her depression is not much better. She complains of some mild right eye pain as well as pressure sensation of the right for head and temples but this is not bothersome. Update 07/25/2015 : She returns for follow-up after last visit a year ago. She continues to do well without recurrent TIA or stroke symptoms. Now for 2 years. She had a cardiac stent placed in February 2015 and hence is on both aspirin and Plavix which is tolerating reasonably well but does have minor bruising but no bleeding episodes. She states her blood pressure is well controlled and today it is 100/71. She had lipid profile checked a few months ago  . Her LDL was 92 and Lipitor dose was increased to 80 mg. She is tolerating it well without side effects. She has no new complaints today. ROS:   14 system review of systems is  positive for fatigue, leg swelling, dizziness, headache, passing out, agitation, confusion, depression and all of the systems negative.  PMH:  Past Medical History  Diagnosis Date  . Hypertension   . Hypercholesteremia   . CAD (coronary artery disease)     a. 03/2013 NSTEMI/Cath: 100 RCA, EF 45%->Med Rx;  b. 12/2013 Cath/PCI: LAD 8m, 30d, D1 80, D2 60, LCX nl, RCA 50ost/p, 1m, 95d (2.5x33 Xience DES);  c. 05/2014 Lexi MV: EF 68%, no  ischemia.  Marland Kitchen CVA (cerebral vascular accident)     a. 08/2013.  . Tobacco abuse   . Anxiety   . Depression   . Migraines   . Ischemic cardiomyopathy     a. 03/2013 Echo: EF 30-35%, mod dil LA, mod-sev MR, mod TR;  b. 08/2013 Echo EF 60-65%, mild AI.  Marland Kitchen Syncope and collapse   . Carotid disease, bilateral     a. 08/2013 Carotid U/S: 1-39% bilat ICA stenosis.    Social History:  Social History   Social History  . Marital Status: Married    Spouse Name: N/A  . Number of Children: 1  . Years of Education: N/A   Occupational History  . lab tech    Social History Main Topics  . Smoking status: Former Smoker -- 0.25 packs/day for 20 years    Types: Cigarettes    Quit date: 04/07/2013  . Smokeless tobacco: Not on file  . Alcohol Use: No  . Drug Use: No  . Sexual Activity: Yes   Other Topics Concern  . Not on file   Social History Narrative    Medications:   Current Outpatient Prescriptions on File Prior to Visit  Medication Sig Dispense Refill  . acetaminophen (TYLENOL) 500 MG tablet Take 1,000 mg by mouth every 6 (six) hours as needed for mild pain or moderate pain.    Marland Kitchen aspirin 81 MG tablet Take 81 mg by mouth daily.    Marland Kitchen atorvastatin (LIPITOR) 80 MG tablet Take 80 mg by mouth daily.     . clopidogrel (PLAVIX) 75 MG tablet TAKE 1 TABLET BY MOUTH DAILY 90 tablet 2  . DULoxetine (CYMBALTA) 60 MG capsule Take 60 mg by mouth daily.    Marland Kitchen ezetimibe (ZETIA) 10 MG tablet Take 1 tablet (10 mg total) by mouth daily. 30 tablet 11  . furosemide (LASIX) 20 MG tablet Take 20 mg by mouth daily as needed for edema.     . isosorbide mononitrate (IMDUR) 60 MG 24 hr tablet Take 30 mg by mouth daily.     . meclizine (ANTIVERT) 25 MG tablet Take 1 tablet (25 mg total) by mouth 3 (three) times daily as needed. 90 tablet 1  . metoprolol succinate (TOPROL-XL) 25 MG 24 hr tablet Take 12.5 mg by mouth every morning.    . nitroGLYCERIN (NITROSTAT) 0.4 MG SL tablet Place 1 tablet (0.4 mg total)  under the tongue every 5 (five) minutes as needed for chest pain. 25 tablet 6  . pantoprazole (PROTONIX) 40 MG tablet Take 40 mg by mouth every 12 (twelve) hours.    . ranolazine (RANEXA) 500 MG 12 hr tablet Take 1 tablet (500 mg total) by mouth 2 (two) times daily. 180 tablet 3  . REXULTI 1 MG TABS Take 1 mg by mouth daily.     . sertraline (ZOLOFT) 100 MG tablet Take 200 mg by mouth daily.     Marland Kitchen zolpidem (AMBIEN) 10 MG tablet Take 10 mg by mouth at bedtime as needed for  sleep.     No current facility-administered medications on file prior to visit.    Allergies:   Allergies  Allergen Reactions  . Codeine Sulfate   . Penicillins Nausea And Vomiting    Physical Exam General: well developed, well nourished middle aged Caucasian lady, seated, in no evident distress Head: head normocephalic and atraumatic.   Neck: supple with no carotid or supraclavicular bruits Cardiovascular: regular rate and rhythm, no murmurs Musculoskeletal: no deformity Skin:  no rash/petichiae Vascular:  Normal pulses all extremities. No scalp tenderness.    Filed Vitals:   07/25/15 1013  BP: 107/71  Pulse: 81   Neurologic Exam Mental Status: Awake and fully alert. Oriented to place and time. Recent and remote memory intact. Attention span, concentration and fund of knowledge appropriate. Mood and affect appropriate. Mini-Mental status exam not done  Cranial Nerves: Fundoscopic exam not done  . Pupils equal, briskly reactive to light. Extraocular movements full without nystagmus. Visual fields full to confrontation. Hearing intact. Facial sensation intact. Face, tongue, palate moves normally and symmetrically.  Motor: Normal bulk and tone. Normal strength in all tested extremity muscles. Sensory.: intact to touch and pinprick and vibratory sensation.  Coordination: Rapid alternating movements normal in all extremities. Finger-to-nose and heel-to-shin performed accurately bilaterally. Gait and Station: Arises  from chair without difficulty. Stance is normal. Gait demonstrates normal stride length and balance . Able to heel, toe and tandem walk without difficulty.  Reflexes: 1+ and symmetric. Toes downgoing.    ASSESSMENT: 83 year patient left brain TIA and a silent right internal capsule infarct secondary to small vessel disease in October 2014 who participated in the POINT trial. She is doing well from neurovascularl standpoint but has memory loss and cognitive difficulties    PLAN: I had a long d/w patient about her remote TIA, risk for recurrent stroke/TIAs, personally independently reviewed imaging studies and stroke evaluation results and answered questions.Continue aspirin and plavix given recent cardiac stent  for secondary stroke prevention and maintain strict control of hypertension with blood pressure goal below 130/90, diabetes with hemoglobin A1c goal below 6.5% and lipids with LDL cholesterol goal below 100 mg/dL. I also advised the patient to eat a healthy diet with plenty of whole grains, cereals, fruits and vegetables, exercise regularly and maintain ideal body weight .since patient has been free of new neurovascular symptoms for more than 2 years I do not believe routine scheduled follow-up appointment with me is necessary. She was advised to keep follow-up with her primary physician and cardiologist. Greater than 50% of time during this 25 minute visit was spent on counseling and coordination of care. Followup in the future with me in the future only as necessary. Antony Contras, MD Note: This document was prepared with digital dictation and possible smart phrase technology. Any transcriptional errors that result from this process are unintentional

## 2015-07-25 NOTE — Patient Instructions (Signed)
I had a long d/w patient about her remote TIA, risk for recurrent stroke/TIAs, personally independently reviewed imaging studies and stroke evaluation results and answered questions.Continue aspirin and plavix given recent cardiac stent  for secondary stroke prevention and maintain strict control of hypertension with blood pressure goal below 130/90, diabetes with hemoglobin A1c goal below 6.5% and lipids with LDL cholesterol goal below 100 mg/dL. I also advised the patient to eat a healthy diet with plenty of whole grains, cereals, fruits and vegetables, exercise regularly and maintain ideal body weight .since patient has been free of new neurovascular symptoms for more than 2 years I do not believe routine scheduled follow-up appointment with me is necessary. She was advised to keep follow-up with her primary physician and cardiologist. Followup in the future with me in the future only as necessary.

## 2015-08-10 ENCOUNTER — Other Ambulatory Visit: Payer: Self-pay | Admitting: Neurology

## 2016-01-07 ENCOUNTER — Ambulatory Visit (INDEPENDENT_AMBULATORY_CARE_PROVIDER_SITE_OTHER): Payer: BLUE CROSS/BLUE SHIELD | Admitting: Cardiovascular Disease

## 2016-01-07 ENCOUNTER — Encounter: Payer: Self-pay | Admitting: Cardiovascular Disease

## 2016-01-07 VITALS — BP 114/68 | HR 88 | Ht 62.5 in | Wt 140.8 lb

## 2016-01-07 DIAGNOSIS — I2510 Atherosclerotic heart disease of native coronary artery without angina pectoris: Secondary | ICD-10-CM | POA: Diagnosis not present

## 2016-01-07 DIAGNOSIS — I209 Angina pectoris, unspecified: Secondary | ICD-10-CM

## 2016-01-07 DIAGNOSIS — R079 Chest pain, unspecified: Secondary | ICD-10-CM

## 2016-01-07 DIAGNOSIS — I10 Essential (primary) hypertension: Secondary | ICD-10-CM | POA: Diagnosis not present

## 2016-01-07 DIAGNOSIS — E785 Hyperlipidemia, unspecified: Secondary | ICD-10-CM

## 2016-01-07 NOTE — Assessment & Plan Note (Signed)
Blood pressure is well controlled on today's visit. No changes made to the medications. Denies any orthostatic symptoms

## 2016-01-07 NOTE — Assessment & Plan Note (Signed)
Unclear if she has been compliant with her zetia. Recommended she fill a medication when this goes generic in the next month Stay on her statin

## 2016-01-07 NOTE — Assessment & Plan Note (Signed)
Several days of chest pain, shortness of breath Etiology unclear, known three-vessel CAD, prior stenting She does not want nuclear stress testing We have scheduled her for a stress echo to rule out ischemia

## 2016-01-07 NOTE — Assessment & Plan Note (Signed)
Etiology of her chest pain is unclear, unable to exclude ischemia Stress echo ordered as above   Total encounter time more than 25 minutes  Greater than 50% was spent in counseling and coordination of care with the patient

## 2016-01-07 NOTE — Patient Instructions (Addendum)
You are doing well. No medication changes were made.  We will schedule a stress echo for chest pain, known CAD Please hold your metoprolol the morning of your test  Date & time: ___________________  Please call us if you have new issues that need to be addressed before your next appt.  Your physician wants you to follow-up in: 6 months.  You will receive a reminder letter in the mail two months in advance. If you don't receive a letter, please call our office to schedule the follow-up appointment.   Exercise Stress Echocardiogram An exercise stress echocardiogram is a heart (cardiac) test used to check the function of your heart. This test may also be called an exercise stress echocardiography or stress echo. This stress test will check how well your heart muscle and valves are working and determine if your heart muscle is getting enough blood. You will exercise on a treadmill to naturally increase or stress the functioning of your heart.  An echocardiogram uses sound waves (ultrasound) to produce an image of your heart. If your heart does not work normally, it may indicate coronary artery disease with poor coronary blood supply. The coronary arteries are the arteries that bring blood and oxygen to your heart. LET [facility] CARE PROVIDER KNOW ABOUT:  Any allergies you have.  All medicines you are taking, including vitamins, herbs, eye drops, creams, and over-the-counter medicines.  Previous problems you or members of your family have had with the use of anesthetics.  Any blood disorders you have.  Previous surgeries you have had.  Medical conditions you have.  Possibility of pregnancy, if this applies. RISKS AND COMPLICATIONS Generally, this is a safe procedure. However, as with any procedure, complications can occur. Possible complications can include:  You develop pain or pressure in the following areas:  Chest.  Jaw or neck.  Between your shoulder blades.  Radiating  down your left arm.  Dizziness or lightheadedness.  Shortness of breath.  Increased or irregular heartbeat.  Nausea or vomiting.  Heart attack (rare). BEFORE THE PROCEDURE  Avoid all forms of caffeine for 24 hours before your test or as directed by your health care provider. This includes coffee, tea (even decaffeinated tea), caffeinated sodas, chocolate, cocoa, and certain pain medicines.  Follow your health care provider's instructions regarding eating and drinking before the test.  Take your medicines as directed at regular times with water unless instructed otherwise. Exceptions may include:  If you have diabetes, ask how you are to take your insulin or pills. It is common to adjust insulin dosing the morning of the test.  If you are taking beta-blocker medicines, it is important to talk to your health care provider about these medicines well before the date of your test. Taking beta-blocker medicines may interfere with the test. In some cases, these medicines need to be changed or stopped 24 hours or more before the test.  If you wear a nitroglycerin patch, it may need to be removed prior to the test. Ask your health care provider if the patch should be removed before the test.  If you use an inhaler for any breathing condition, bring it with you to the test.  If you are an outpatient, bring a snack so you can eat right after the stress phase of the test.  Do not smoke for 4 hours prior to the test or as directed by your health care provider.  Wear loose-fitting clothes and comfortable shoes for the test. This test involves  walking on a treadmill. PROCEDURE   Multiple electrodes will be put on your chest. If needed, small areas of your chest may be shaved to get better contact with the electrodes. Once the electrodes are attached to your body, multiple wires will be attached to the electrodes, and your heart rate will be monitored.  You will have an echocardiogram done at  rest.  To produce this image of your heart, gel is applied to your chest, and a wand-like tool (transducer) is moved over the chest. The transducer sends the sound waves through the chest to create the moving images of your heart.  You may need an IV to receive a medication that improves the quality of the pictures.  You will then walk on a treadmill. The treadmill will be started at a slow pace. The treadmill speed and incline will gradually be increased to raise your heart rate.  At the peak of exercise, the treadmill will be stopped. You will lie down immediately on a bed so that a second echocardiogram can be done to visualize your heart's motion with exercise.  The test usually takes 30-60 minutes to complete. AFTER THE PROCEDURE  Your heart rate and blood pressure will be monitored after the test.  You may return to your normal schedule, including diet, activities, and medicines, unless your health care provider tells you otherwise.   This information is not intended to replace advice given to you by your health care provider. Make sure you discuss any questions you have with your health care provider.   Document Released: 10/30/2004 Document Revised: 10/31/2013 Document Reviewed: 07/03/2013 Elsevier Interactive Patient Education [employer].

## 2016-01-07 NOTE — Progress Notes (Signed)
Patient ID: [name], female    DOB: [birth year], 60 y.o.   MRN: [id number]  HPI Comments: [name] is a 60 year old woman with long history of smoking who stopped in May 2014, coronary artery disease with non-ST elevation in may 2015 with stent placed to her distal RCA, stroke October 2014 evaluated at [facility]. She presents for routine followup of her coronary artery disease. Previous anginal symptoms included back pain, chest pain like an elephant sitting on her chest  In follow-up today, she reports that she has had some chest discomfort for the past several days, Discomfort is in the mediastinal area, possibly radiating to the left.  Possibly a positional component, worse when she sits up, better when she lays down, Unclear if there is a exertional component. She has been having increasing shortness of breath. Was started on Lasix by Dr. Ola Spurr one month ago for symptoms of leg edema. She takes this sparingly. She reports that she took Lasix 2 days ago with no improvement of her shortness of breath and chest pain symptoms. Current symptoms do not feel like her previous anginal symptoms  She reports potassium has been running low despite taking bananas on a frequent basis.  Approximately one week or 10 days ago she finished a long stretch of "boot camp" workouts. Was not having any chest pain on exertion at that time  Review of lab work shows total cholesterol 186  EKG on today's visit shows normal sinus rhythm with rate 88 bpm, nonspecific T wave abnormality  Other past medical history She has severe ostio arthritis of the knee, bone-on-bone Tolerating her cholesterol medication,  on Lipitor 80 mg daily  Review of lab work from April 2016 shows total cholesterol 156, LDL 69   hospitalization 12/31/2013 where she presented with angina. He had a catheterization with distal RCA stent placed.  Also started on ranexa, continued on isosorbide.   Previous Cardiac  catheterization report 01/01/2014 details 95% distal RCA lesion with stent placed. Also had 40% mid LAD, 30% distal LAD, 80% D1 disease, 60% D2 disease, 50% ostial RCA disease, 50% proximal RCA disease, 60% mid RCA disease stent placed was a xience  EX 2.5 millimeter stent  Cardiac catheterization in 03/16/2013 details occluded mid RCA with collaterals from distal LAD also 75% stenosis diagonal #1 near the ostium Ejection fraction at that time was 46% notes also detailed 40% mid LAD, 30% distal LAD, 50% D2 disease, 30% proximal circumflex, 75% proximal RCA disease  Echocardiogram 03/16/2013 with ejection fraction 30-35%, moderate to severe mitral valve regurgitation, moderate tricuspid valve regurgitation .  echocardiogram October 2014 following her stroke showed relatively normal ejection fraction estimated at least greater than 55% with no significant valvular regurgitation     Allergies  Allergen Reactions  . Codeine Sulfate   . Penicillins Nausea And Vomiting    Current Outpatient Prescriptions on File Prior to Visit  Medication Sig Dispense Refill  . acetaminophen (TYLENOL) 500 MG tablet Take 1,000 mg by mouth every 6 (six) hours as needed for mild pain or moderate pain.    Marland Kitchen aspirin 81 MG tablet Take 81 mg by mouth daily.    Marland Kitchen atorvastatin (LIPITOR) 80 MG tablet Take 80 mg by mouth daily.     . clopidogrel (PLAVIX) 75 MG tablet TAKE 1 TABLET BY MOUTH DAILY 90 tablet 4  . DULoxetine (CYMBALTA) 60 MG capsule Take 60 mg by mouth daily.    Marland Kitchen ezetimibe (ZETIA) 10 MG tablet Take 1 tablet (  10 mg total) by mouth daily. 30 tablet 11  . isosorbide mononitrate (IMDUR) 60 MG 24 hr tablet Take 30 mg by mouth daily.     . meclizine (ANTIVERT) 25 MG tablet Take 1 tablet (25 mg total) by mouth 3 (three) times daily as needed. 90 tablet 1  . metoprolol succinate (TOPROL-XL) 25 MG 24 hr tablet Take 12.5 mg by mouth every morning.    . nitroGLYCERIN (NITROSTAT) 0.4 MG SL tablet Place 1 tablet (0.4 mg  total) under the tongue every 5 (five) minutes as needed for chest pain. 25 tablet 6  . pantoprazole (PROTONIX) 40 MG tablet Take 40 mg by mouth every 12 (twelve) hours.    . ranolazine (RANEXA) 500 MG 12 hr tablet Take 1 tablet (500 mg total) by mouth 2 (two) times daily. 180 tablet 3  . sertraline (ZOLOFT) 100 MG tablet Take 200 mg by mouth daily.      No current facility-administered medications on file prior to visit.    Past Medical History  Diagnosis Date  . Hypertension   . Hypercholesteremia   . CAD (coronary artery disease)     a. 03/2013 NSTEMI/Cath: 100 RCA, EF 45%->Med Rx;  b. 12/2013 Cath/PCI: LAD 34m, 30d, D1 80, D2 60, LCX nl, RCA 50ost/p, 10m, 95d (2.5x33 Xience DES);  c. 05/2014 Lexi MV: EF 68%, no ischemia.  Marland Kitchen CVA (cerebral vascular accident) ([place])     a. 08/2013.  . Tobacco abuse   . Anxiety   . Depression   . Migraines   . Ischemic cardiomyopathy     a. 03/2013 Echo: EF 30-35%, mod dil LA, mod-sev MR, mod TR;  b. 08/2013 Echo EF 60-65%, mild AI.  Marland Kitchen Syncope and collapse   . Carotid disease, bilateral ([place])     a. 08/2013 Carotid U/S: 1-39% bilat ICA stenosis.    Past Surgical History  Procedure Laterality Date  . Oophorectomy    . Cesarean section    . Cesarean section with bilateral tubal ligation    . Knee arthroscopy  11/27/2006    left knee   . Cardiac catheterization  01/01/2014  . Coronary angioplasty with stent placement  01/01/2014    95% lesion with a drug eluting stent to the distal RCA.  . Cardiac catheterization  03/16/2013  . Cardiac catheterization  12/2013    Social History  reports that she quit smoking about 2 years ago. Her smoking use included Cigarettes. She has a 5 pack-year smoking history. She does not have any smokeless tobacco history on file. She reports that she does not drink alcohol or use illicit drugs.  Family History family history includes Hyperlipidemia in her mother; Hypertension in her mother; Stroke in her  mother.   Review of Systems  Constitutional: Negative.   Respiratory: Positive for shortness of breath.   Cardiovascular: Positive for chest pain.  Gastrointestinal: Negative.   Musculoskeletal: Negative.   Skin: Negative.   Neurological: Negative.   Hematological: Negative.   Psychiatric/Behavioral: Negative.   All other systems reviewed and are negative.  BP 114/68 mmHg  Pulse 88  Ht 5' 2.5" (1.588 m)  Wt 140 lb 12 oz (63.844 kg)  BMI 25.32 kg/m2  Physical Exam  Constitutional: She is oriented to person, place, and time. She appears well-developed and well-nourished.  HENT:  Head: Normocephalic.  Nose: Nose normal.  Mouth/Throat: Oropharynx is clear and moist.  Eyes: Conjunctivae are normal. Pupils are equal, round, and reactive to light.  Neck: Normal range of  motion. Neck supple. No JVD present.  Cardiovascular: Normal rate, regular rhythm, normal heart sounds and intact distal pulses.  Exam reveals no gallop and no friction rub.   No murmur heard. Pulmonary/Chest: Effort normal and breath sounds normal. No respiratory distress. She has no wheezes. She has no rales. She exhibits no tenderness.  Abdominal: Soft. Bowel sounds are normal. She exhibits no distension. There is no tenderness.  Musculoskeletal: Normal range of motion. She exhibits no edema or tenderness.  Lymphadenopathy:    She has no cervical adenopathy.  Neurological: She is alert and oriented to person, place, and time. Coordination normal.  Skin: Skin is warm and dry. No rash noted. No erythema.  Psychiatric: She has a normal mood and affect. Her behavior is normal. Judgment and thought content normal.

## 2016-01-29 ENCOUNTER — Telehealth: Payer: Self-pay

## 2016-01-29 NOTE — Telephone Encounter (Signed)
Reviewed Stress ECHO instructions w/ pt.  She verbalizes understanding and will keep her appt tomorrow.

## 2016-01-30 ENCOUNTER — Ambulatory Visit (INDEPENDENT_AMBULATORY_CARE_PROVIDER_SITE_OTHER): Payer: BLUE CROSS/BLUE SHIELD

## 2016-01-30 DIAGNOSIS — R079 Chest pain, unspecified: Secondary | ICD-10-CM

## 2016-01-30 DIAGNOSIS — I10 Essential (primary) hypertension: Secondary | ICD-10-CM

## 2016-01-30 DIAGNOSIS — I2510 Atherosclerotic heart disease of native coronary artery without angina pectoris: Secondary | ICD-10-CM | POA: Diagnosis not present

## 2016-01-31 LAB — ECHOCARDIOGRAM STRESS TEST
CSEPEW: 8.4 METS
CSEPHR: 78 %
Exercise duration (min): 7 min
Exercise duration (sec): 54 s
MPHR: 162 {beats}/min
Peak HR: 127 {beats}/min
Rest HR: 85 {beats}/min

## 2016-02-11 ENCOUNTER — Ambulatory Visit (INDEPENDENT_AMBULATORY_CARE_PROVIDER_SITE_OTHER): Payer: PRIVATE HEALTH INSURANCE | Admitting: Obstetrics and Gynecology

## 2016-02-11 ENCOUNTER — Encounter: Payer: Self-pay | Admitting: Obstetrics and Gynecology

## 2016-02-11 VITALS — BP 117/73 | HR 86 | Ht 62.0 in | Wt 139.9 lb

## 2016-02-11 DIAGNOSIS — Z0000 Encounter for general adult medical examination without abnormal findings: Secondary | ICD-10-CM

## 2016-02-11 DIAGNOSIS — Z1211 Encounter for screening for malignant neoplasm of colon: Secondary | ICD-10-CM

## 2016-02-11 DIAGNOSIS — N390 Urinary tract infection, site not specified: Secondary | ICD-10-CM

## 2016-02-11 DIAGNOSIS — Z01419 Encounter for gynecological examination (general) (routine) without abnormal findings: Secondary | ICD-10-CM

## 2016-02-11 DIAGNOSIS — Z1239 Encounter for other screening for malignant neoplasm of breast: Secondary | ICD-10-CM

## 2016-02-11 DIAGNOSIS — Z780 Asymptomatic menopausal state: Secondary | ICD-10-CM | POA: Diagnosis not present

## 2016-02-11 MED ORDER — NITROFURANTOIN MONOHYD MACRO 100 MG PO CAPS: 100.0000 mg | ORAL_CAPSULE | Freq: Every day | ORAL | Status: DC

## 2016-02-11 NOTE — Progress Notes (Signed)
Patient ID: [name], female   DOB: 05/16/1956, 60 y.o.   MRN: [id number] ANNUAL PREVENTATIVE CARE GYN  ENCOUNTER NOTE  Subjective:       [name] is a 60 y.o. G97P1001 female here for a routine annual gynecologic exam.  Current complaints: 1.  none   2. History of UTIs ; none since starting Macrobid antibiotic prophylaxis   Gynecologic History No LMP recorded. Patient has had an ablation. Contraception: none Last Pap: 01/2014 neg/neg. Results were: normal Last mammogram: 02/25/2015 birad 1. Results were: normal  Obstetric History OB History  Gravida Para Term Preterm AB SAB TAB Ectopic Multiple Living  1 1 1       1     # Outcome Date GA Lbr Len/2nd Weight Sex Delivery Anes PTL Lv  1 Term      [id number]   Y      Past Medical History  Diagnosis Date  . Hypertension   . Hypercholesteremia   . CAD (coronary artery disease)     a. 03/2013 NSTEMI/Cath: 100 RCA, EF 45%->Med Rx;  b. 12/2013 Cath/PCI: LAD 34m, 30d, D1 80, D2 60, LCX nl, RCA 50ost/p, 67m, 95d (2.5x33 Xience DES);  c. 05/2014 Lexi MV: EF 68%, no ischemia.  . Tobacco abuse   . Anxiety   . Depression   . Migraines   . Ischemic cardiomyopathy     a. 03/2013 Echo: EF 30-35%, mod dil LA, mod-sev MR, mod TR;  b. 08/2013 Echo EF 60-65%, mild AI.  Marland Kitchen Syncope and collapse   . Carotid disease, bilateral ([place])     a. 08/2013 Carotid U/S: 1-39% bilat ICA stenosis.  Marland Kitchen CVA (cerebral vascular accident) ([place])     a. 08/2013.  Marland Kitchen Myocardial infarction ([place])   . Insomnia   . Hyperlipemia   . Vaginal atrophy   . Decreased libido   . History of recurrent UTIs   . Menopause   . Right lower quadrant pain     Past Surgical History  Procedure Laterality Date  . Oophorectomy    . Cesarean section    . Cesarean section with bilateral tubal ligation    . Knee arthroscopy  11/27/2006    left knee   . Cardiac catheterization  01/01/2014  . Cardiac catheterization  03/16/2013  . Cardiac catheterization  12/2013  . Endometrial  ablation    . Tubal ligation    . Coronary angioplasty with stent placement  01/01/2014    95% lesion with a drug eluting stent to the distal RCA.    Current Outpatient Prescriptions on File Prior to Visit  Medication Sig Dispense Refill  . acetaminophen (TYLENOL) 500 MG tablet Take 1,000 mg by mouth every 6 (six) hours as needed for mild pain or moderate pain.    Marland Kitchen aspirin 81 MG tablet Take 81 mg by mouth daily.    Marland Kitchen atorvastatin (LIPITOR) 80 MG tablet Take 80 mg by mouth daily.     . Brexpiprazole (REXULTI) 3 MG TABS Take by mouth daily.    . clopidogrel (PLAVIX) 75 MG tablet TAKE 1 TABLET BY MOUTH DAILY 90 tablet 4  . DULoxetine (CYMBALTA) 60 MG capsule Take 60 mg by mouth daily.    Marland Kitchen ezetimibe (ZETIA) 10 MG tablet Take 1 tablet (10 mg total) by mouth daily. 30 tablet 11  . isosorbide mononitrate (IMDUR) 60 MG 24 hr tablet Take 30 mg by mouth daily.     . metoprolol succinate (TOPROL-XL) 25 MG 24 hr tablet  Take 12.5 mg by mouth every morning.    . nitroGLYCERIN (NITROSTAT) 0.4 MG SL tablet Place 1 tablet (0.4 mg total) under the tongue every 5 (five) minutes as needed for chest pain. 25 tablet 6  . pantoprazole (PROTONIX) 40 MG tablet Take 40 mg by mouth every 12 (twelve) hours.    . ranolazine (RANEXA) 500 MG 12 hr tablet Take 1 tablet (500 mg total) by mouth 2 (two) times daily. 180 tablet 3  . sertraline (ZOLOFT) 100 MG tablet Take 200 mg by mouth daily.      No current facility-administered medications on file prior to visit.    Allergies  Allergen Reactions  . Codeine Sulfate   . Penicillins Nausea And Vomiting    Social History   Social History  . Marital Status: Married    Spouse Name: N/A  . Number of Children: 1  . Years of Education: N/A   Occupational History  . lab tech    Social History Main Topics  . Smoking status: Former Smoker -- 0.25 packs/day for 20 years    Types: Cigarettes    Quit date: 04/07/2013  . Smokeless tobacco: Not on file  . Alcohol  Use: No  . Drug Use: No  . Sexual Activity: Not Currently    Birth Control/ Protection: None   Other Topics Concern  . Not on file   Social History Narrative    Family History  Problem Relation Age of Onset  . Hypertension Mother   . Stroke Mother   . Hyperlipidemia Mother   . Breast cancer Paternal Grandmother   . Colon cancer Neg Hx   . Ovarian cancer Neg Hx   . Heart disease Neg Hx   . Diabetes Neg Hx     The following portions of the patient's history were reviewed and updated as appropriate: allergies, current medications, past family history, past medical history, past social history, past surgical history and problem list.  Review of Systems ROS Review of Systems - General ROS: negative for - chills, fatigue, fever, hot flashes, night sweats, weight gain or weight loss Psychological ROS: negative for - anxiety, decreased libido, depression, mood swings, physical abuse or sexual abuse Ophthalmic ROS: negative for - blurry vision, eye pain or loss of vision ENT ROS: negative for - headaches, hearing change, visual changes or vocal changes Allergy and Immunology ROS: negative for - hives, itchy/watery eyes or seasonal allergies Hematological and Lymphatic ROS: negative for - bleeding problems, bruising, swollen lymph nodes or weight loss Endocrine ROS: negative for - galactorrhea, hair pattern changes, hot flashes, malaise/lethargy, mood swings, palpitations, polydipsia/polyuria, skin changes, temperature intolerance or unexpected weight changes Breast ROS: negative for - new or changing breast lumps or nipple discharge Respiratory ROS: negative for - cough or shortness of breath Cardiovascular ROS: negative for - chest pain, irregular heartbeat, palpitations or shortness of breath Gastrointestinal ROS: no abdominal pain, change in bowel habits, or black or bloody stools Genito-Urinary ROS: no dysuria, trouble voiding, or hematuria Musculoskeletal ROS: negative for - joint  pain or joint stiffness Neurological ROS: negative for - bowel and bladder control changes Dermatological ROS: negative for rash and skin lesion changes   Objective:   BP 117/73 mmHg  Pulse 86  Ht 5\' 2"  (1.575 m)  Wt 139 lb 14.4 oz (63.458 kg)  BMI 25.58 kg/m2 CONSTITUTIONAL: Well-developed, well-nourished female in no acute distress.  PSYCHIATRIC: Normal mood and affect. Normal behavior. Normal judgment and thought content. [place]: Alert and oriented to  person, place, and time. Normal muscle tone coordination. No cranial nerve deficit noted. HENT:  Normocephalic, atraumatic, External right and left ear normal. Oropharynx is clear and moist EYES: Conjunctivae and EOM are normal.  No scleral icterus.  NECK: Normal range of motion, supple, no masses.  Normal thyroid.  SKIN: Skin is warm and dry. No rash noted. Not diaphoretic. No erythema. No pallor. CARDIOVASCULAR: Normal heart rate noted, regular rhythm, no murmur. RESPIRATORY: Clear to auscultation bilaterally. Effort and breath sounds normal, no problems with respiration noted. BREASTS: Symmetric in size. No masses, skin changes, nipple drainage, or lymphadenopathy. ABDOMEN: Soft, normal bowel sounds, no distention noted.  No tenderness, rebound or guarding.  BLADDER: Normal PELVIC:  External Genitalia: Normal  BUS: Normal  Vagina: Normal  Cervix: Normal  Uterus: Normal ; midplane, normal size shape, mobile, nontender  Adnexa: Normal  RV: External Exam NormaI, No Rectal Masses and Normal Sphincter tone  MUSCULOSKELETAL: Normal range of motion. No tenderness.  No cyanosis, clubbing, or edema.  2+ distal pulses. LYMPHATIC: No Axillary, Supraclavicular, or Inguinal Adenopathy.    Assessment:   Annual gynecologic examination 60 y.o. Contraception: none Normal BMI  menopause, asymptomatic   recurrent UTI, asymptomatic on Macrobid prophylaxis  Plan:  Pap: Not needed Mammogram: Ordered Stool Guaiac Testing:   Ordered Labs: thru pcp Routine preventative health maintenance measures emphasized: Exercise/Diet/Weight control, Tobacco Warnings and Alcohol/Substance use risks  Macrobid refilled Return to [place], CMA  Brayton Mars, MD  Note: This dictation was prepared with Dragon dictation along with smaller phrase technology. Any transcriptional errors that result from this process are unintentional.

## 2016-02-11 NOTE — Patient Instructions (Signed)
1. No Pap smear until 2018  2. Mammogram ordered 3. Stool guaiac cards given for colon cancer screening 4. Continue with healthy eating and exercise  5. Calcium with vitamin D supplementation to be continued  6. Macrobid UTI prophylaxis is refilled. Take 1 daily 7. Return in 1 year

## 2016-03-04 DIAGNOSIS — F410 Panic disorder [episodic paroxysmal anxiety] without agoraphobia: Secondary | ICD-10-CM | POA: Diagnosis not present

## 2016-03-04 DIAGNOSIS — F332 Major depressive disorder, recurrent severe without psychotic features: Secondary | ICD-10-CM | POA: Diagnosis not present

## 2016-04-21 DIAGNOSIS — F332 Major depressive disorder, recurrent severe without psychotic features: Secondary | ICD-10-CM | POA: Diagnosis not present

## 2016-04-21 DIAGNOSIS — F410 Panic disorder [episodic paroxysmal anxiety] without agoraphobia: Secondary | ICD-10-CM | POA: Diagnosis not present

## 2016-05-19 DIAGNOSIS — L814 Other melanin hyperpigmentation: Secondary | ICD-10-CM | POA: Diagnosis not present

## 2016-05-19 DIAGNOSIS — L821 Other seborrheic keratosis: Secondary | ICD-10-CM | POA: Diagnosis not present

## 2016-05-19 DIAGNOSIS — D229 Melanocytic nevi, unspecified: Secondary | ICD-10-CM | POA: Diagnosis not present

## 2016-05-19 DIAGNOSIS — L74510 Primary focal hyperhidrosis, axilla: Secondary | ICD-10-CM | POA: Diagnosis not present

## 2016-05-19 DIAGNOSIS — D485 Neoplasm of uncertain behavior of skin: Secondary | ICD-10-CM | POA: Diagnosis not present

## 2016-05-19 DIAGNOSIS — L578 Other skin changes due to chronic exposure to nonionizing radiation: Secondary | ICD-10-CM | POA: Diagnosis not present

## 2016-05-19 DIAGNOSIS — Z1283 Encounter for screening for malignant neoplasm of skin: Secondary | ICD-10-CM | POA: Diagnosis not present

## 2016-06-01 DIAGNOSIS — F332 Major depressive disorder, recurrent severe without psychotic features: Secondary | ICD-10-CM | POA: Diagnosis not present

## 2016-06-01 DIAGNOSIS — F410 Panic disorder [episodic paroxysmal anxiety] without agoraphobia: Secondary | ICD-10-CM | POA: Diagnosis not present

## 2016-06-19 ENCOUNTER — Emergency Department: Payer: PPO

## 2016-06-19 ENCOUNTER — Encounter: Payer: Self-pay | Admitting: Emergency Medicine

## 2016-06-19 ENCOUNTER — Emergency Department
Admission: EM | Admit: 2016-06-19 | Discharge: 2016-06-19 | Disposition: A | Discharge status: Home / Self Care | Payer: PPO | Attending: Emergency Medicine | Admitting: Emergency Medicine

## 2016-06-19 DIAGNOSIS — I10 Essential (primary) hypertension: Secondary | ICD-10-CM | POA: Insufficient documentation

## 2016-06-19 DIAGNOSIS — Z7982 Long term (current) use of aspirin: Secondary | ICD-10-CM | POA: Insufficient documentation

## 2016-06-19 DIAGNOSIS — I2510 Atherosclerotic heart disease of native coronary artery without angina pectoris: Secondary | ICD-10-CM | POA: Diagnosis not present

## 2016-06-19 DIAGNOSIS — Z79899 Other long term (current) drug therapy: Secondary | ICD-10-CM | POA: Insufficient documentation

## 2016-06-19 DIAGNOSIS — F419 Anxiety disorder, unspecified: Secondary | ICD-10-CM | POA: Diagnosis not present

## 2016-06-19 DIAGNOSIS — E876 Hypokalemia: Secondary | ICD-10-CM | POA: Insufficient documentation

## 2016-06-19 DIAGNOSIS — R0789 Other chest pain: Secondary | ICD-10-CM | POA: Insufficient documentation

## 2016-06-19 DIAGNOSIS — R079 Chest pain, unspecified: Secondary | ICD-10-CM

## 2016-06-19 DIAGNOSIS — R0602 Shortness of breath: Secondary | ICD-10-CM | POA: Diagnosis not present

## 2016-06-19 DIAGNOSIS — Z87891 Personal history of nicotine dependence: Secondary | ICD-10-CM | POA: Insufficient documentation

## 2016-06-19 LAB — CBC
HEMATOCRIT: 35 % (ref 35.0–47.0)
Hemoglobin: 12.1 g/dL (ref 12.0–16.0)
MCH: 31.8 pg (ref 26.0–34.0)
MCHC: 34.5 g/dL (ref 32.0–36.0)
MCV: 92.4 fL (ref 80.0–100.0)
PLATELETS: 273 10*3/uL (ref 150–440)
RBC: 3.79 MIL/uL — ABNORMAL LOW (ref 3.80–5.20)
RDW: 13.2 % (ref 11.5–14.5)
WBC: 10.1 10*3/uL (ref 3.6–11.0)

## 2016-06-19 LAB — BASIC METABOLIC PANEL
Anion gap: 7 (ref 5–15)
BUN: 13 mg/dL (ref 6–20)
CHLORIDE: 106 mmol/L (ref 101–111)
CO2: 26 mmol/L (ref 22–32)
CREATININE: 0.64 mg/dL (ref 0.44–1.00)
Calcium: 9.1 mg/dL (ref 8.9–10.3)
GFR calc Af Amer: >60 mL/min (ref >60)
GLUCOSE: 142 mg/dL — AB (ref 65–99)
POTASSIUM: 3 mmol/L — AB (ref 3.5–5.1)
SODIUM: 139 mmol/L (ref 135–145)

## 2016-06-19 LAB — TROPONIN I: Troponin I: <0.03 ng/mL (ref <0.03)

## 2016-06-19 MED ORDER — ALPRAZOLAM 0.5 MG PO TABS
0.2500 mg | ORAL_TABLET | Freq: Once | ORAL | Status: AC
  Administered 2016-06-19: 0.25 mg via ORAL
  Filled 2016-06-19: qty 1

## 2016-06-19 MED ORDER — ASPIRIN 81 MG PO CHEW
324.0000 mg | CHEWABLE_TABLET | Freq: Once | ORAL | Status: AC
  Administered 2016-06-19: 324 mg via ORAL
  Filled 2016-06-19: qty 4

## 2016-06-19 MED ORDER — POTASSIUM CHLORIDE CRYS ER 20 MEQ PO TBCR
40.0000 meq | EXTENDED_RELEASE_TABLET | Freq: Once | ORAL | Status: AC
Start: 2016-06-19 — End: 2016-06-19
  Administered 2016-06-19: 40 meq via ORAL
  Filled 2016-06-19: qty 2

## 2016-06-19 NOTE — Discharge Instructions (Signed)
You were evaluated for chest discomfort since last night, and your exam and evaluation are reassuring in the emergency department today.  Please call your cardiologist to make an appointment on Monday. Also follow up with the primary care physician as well as her psychiatrist.  Return to emergency room for any worsening condition clinic chest pain, nausea, sweats, trouble breathing, fever, dizziness or passing out, palpitations, or any other symptoms concerning to you.

## 2016-06-19 NOTE — ED Provider Notes (Addendum)
[facility] Emergency Department Provider Note ____________________________________________   I have reviewed the triage vital signs and the triage nursing note.  HISTORY  Chief Complaint Shortness of Breath   Historian Patient  HPI [name] is a 60 y.o. female with a history of coronary disease, follows with Dr. Rockey Situ for cardiology, has a stent placed in 2015, is here with chest discomfort. States central chest discomfort and pressure that started yesterday and has been constant since then. She also is treated for anxiety and feels like this could be an anxiety attack, given the fact that it is not going away, she did want to come in and get checked out. She is somewhat anxious about taking her first plane flight which will be to the right, is coming up soon. She had a little bit of nausea this morning. No extension or radiation of the chest discomfort. No pleuritic chest pain. No palpitations. No cough or congestion or recent illnesses. No fevers.  No burping or belching or diarrhea/constipation.    Past Medical History:  Diagnosis Date  . Anxiety   . CAD (coronary artery disease)    a. 03/2013 NSTEMI/Cath: 100 RCA, EF 45%->Med Rx;  b. 12/2013 Cath/PCI: LAD 67m, 30d, D1 80, D2 60, LCX nl, RCA 50ost/p, 34m, 95d (2.5x33 Xience DES);  c. 05/2014 Lexi MV: EF 68%, no ischemia.  . Carotid disease, bilateral ([place])    a. 08/2013 Carotid U/S: 1-39% bilat ICA stenosis.  Marland Kitchen CVA (cerebral vascular accident) ([place])    a. 08/2013.  [name]   . Depression   . History of recurrent UTIs   . Hypercholesteremia   . Hyperlipemia   . Hypertension   . Insomnia   . Ischemic cardiomyopathy    a. 03/2013 Echo: EF 30-35%, mod dil LA, mod-sev MR, mod TR;  b. 08/2013 Echo EF 60-65%, mild AI.  Marland Kitchen Menopause   . Migraines   . Myocardial infarction ([place])   . Right lower quadrant pain   . Syncope and collapse   . Tobacco abuse   . Vaginal atrophy     Patient Active  Problem List   Diagnosis Date Noted  . Recurrent UTI 02/11/2016  . Menopause 02/11/2016  . Angina pectoris ([place]) 01/07/2016  . Syncope 07/11/2015  . Orthostatic hypotension 02/19/2015  . Dehydration 02/19/2015  . Hypokalemia 02/19/2015  . Stroke ([place]) 02/18/2015  . Tobacco abuse 02/18/2015  . History of stroke 07/18/2014  . Vertigo 07/18/2014  . Bilateral wrist pain 07/18/2014  . Atypical chest pain 06/05/2014  . CAD (coronary artery disease) 01/19/2014  . Carotid arterial disease ([place]) 01/19/2014  . Depression 01/16/2014  . Memory loss 01/16/2014  . Right sided weakness 08/10/2013  . Hypertension 08/10/2013  . Hyperlipidemia 08/10/2013  . History of MI (myocardial infarction) 08/10/2013  . Stroke, acute, embolic ([place]) [id number]    Past Surgical History:  Procedure Laterality Date  . CARDIAC CATHETERIZATION  01/01/2014  . CARDIAC CATHETERIZATION  03/16/2013  . CARDIAC CATHETERIZATION  12/2013  . CESAREAN SECTION    . CESAREAN SECTION WITH BILATERAL TUBAL LIGATION    . CORONARY ANGIOPLASTY WITH STENT PLACEMENT  01/01/2014   95% lesion with a drug eluting stent to the distal RCA.  Marland Kitchen ENDOMETRIAL ABLATION    . KNEE ARTHROSCOPY  11/27/2006   left knee   . OOPHORECTOMY    . TUBAL LIGATION      Prior to Admission medications   Medication Sig Start Date End Date Taking? Authorizing  Provider  acetaminophen (TYLENOL) 500 MG tablet Take 1,000 mg by mouth every 6 (six) hours as needed for mild pain or moderate pain.    Historical Provider, MD  aspirin 81 MG tablet Take 81 mg by mouth daily.    Historical Provider, MD  atorvastatin (LIPITOR) 80 MG tablet Take 80 mg by mouth daily.  03/12/15   Historical Provider, MD  Brexpiprazole (REXULTI) 3 MG TABS Take by mouth daily.    Historical Provider, MD  clopidogrel (PLAVIX) 75 MG tablet TAKE 1 TABLET BY MOUTH DAILY 08/10/15   Garvin Fila, MD  DULoxetine (CYMBALTA) 60 MG capsule Take 60 mg by mouth daily.    Historical Provider, MD   ezetimibe (ZETIA) 10 MG tablet Take 1 tablet (10 mg total) by mouth daily. 07/11/15   Minna Merritts, MD  furosemide (LASIX) 20 MG tablet  01/20/16   Historical Provider, MD  hydrOXYzine (ATARAX/VISTARIL) 25 MG tablet TK 1/2 TO 1 T PO BID PRA 01/27/16   Historical Provider, MD  isosorbide mononitrate (IMDUR) 60 MG 24 hr tablet Take 30 mg by mouth daily.     Historical Provider, MD  metoprolol succinate (TOPROL-XL) 25 MG 24 hr tablet Take 12.5 mg by mouth every morning.    Historical Provider, MD  nitrofurantoin, macrocrystal-monohydrate, (MACROBID) 100 MG capsule Take 1 capsule (100 mg total) by mouth at bedtime. 02/11/16   Alanda Slim Defrancesco, MD  nitroGLYCERIN (NITROSTAT) 0.4 MG SL tablet Place 1 tablet (0.4 mg total) under the tongue every 5 (five) minutes as needed for chest pain. 06/03/15   Minna Merritts, MD  pantoprazole (PROTONIX) 40 MG tablet Take 40 mg by mouth every 12 (twelve) hours.    Historical Provider, MD  potassium chloride SA (K-DUR,KLOR-CON) 20 MEQ tablet  01/20/16   Historical Provider, MD  ranolazine (RANEXA) 500 MG 12 hr tablet Take 1 tablet (500 mg total) by mouth 2 (two) times daily. 11/06/14   Minna Merritts, MD  sertraline (ZOLOFT) 100 MG tablet Take 200 mg by mouth daily.     Historical Provider, MD  traZODone (DESYREL) 100 MG tablet  01/27/16   Historical Provider, MD  triamterene-hydrochlorothiazide (DYAZIDE) 37.5-25 MG capsule TK 1 C PO QAM 01/10/16   Historical Provider, MD    Allergies  Allergen Reactions  . Codeine Sulfate   . Penicillins Nausea And Vomiting    Family History  Problem Relation Age of Onset  . Hypertension Mother   . Stroke Mother   . Hyperlipidemia Mother   . Breast cancer Paternal Grandmother   . Colon cancer Neg Hx   . Ovarian cancer Neg Hx   . Heart disease Neg Hx   . Diabetes Neg Hx     Social History Social History  Substance Use Topics  . Smoking status: Former Smoker    Packs/day: 0.25    Years: 20.00    Types: Cigarettes     Quit date: 04/07/2013  . Smokeless tobacco: Not on file  . Alcohol use No    Review of Systems  Constitutional: Negative for fever. Eyes: Negative for visual changes. ENT: Negative for sore throat. Cardiovascular: Positive for chest pain. Respiratory: Occasional shortness of breath. Gastrointestinal: Negative for abdominal pain, vomiting and diarrhea. Genitourinary: Negative for dysuria. Musculoskeletal: Negative for back pain. Skin: Negative for rash. Neurological: Negative for headache. 10 point Review of Systems otherwise negative ____________________________________________   PHYSICAL EXAM:  VITAL SIGNS: ED Triage Vitals  Enc Vitals Group     BP 06/19/16  1417 (!) 158/78     Pulse Rate 06/19/16 1417 75     Resp 06/19/16 1417 16     Temp 06/19/16 1417 98.3 F (36.8 C)     Temp Source 06/19/16 1417 Oral     SpO2 06/19/16 1417 98 %     Weight 06/19/16 1418 132 lb (59.9 kg)     Height 06/19/16 1418 5\' 2"  (1.575 m)     Head Circumference --      Peak Flow --      Pain Score 06/19/16 1418 8     Pain Loc --      Pain Edu? --      Excl. in [place]? --      Constitutional: Alert and oriented. Well appearing and in no distress. HEENT   Head: Normocephalic and atraumatic.      Eyes: Conjunctivae are normal. PERRL. Normal extraocular movements.      Ears:         Nose: No congestion/rhinnorhea.   Mouth/Throat: Mucous membranes are moist.   Neck: No stridor. Cardiovascular/Chest: Normal rate, regular rhythm.  No murmurs, rubs, or gallops. Respiratory: Normal respiratory effort without tachypnea nor retractions. Breath sounds are clear and equal bilaterally. No wheezes/rales/rhonchi. Gastrointestinal: Soft. No distention, no guarding, no rebound. Nontender.    Genitourinary/rectal:Deferred Musculoskeletal: Nontender with normal range of motion in all extremities. No joint effusions.  No lower extremity tenderness.  No edema. Neurologic:  Normal speech and  language. No gross or focal neurologic deficits are appreciated. Skin:  Skin is warm, dry and intact. No rash noted. Psychiatric: Mood and affect are normal. Speech and behavior are normal. Patient exhibits appropriate insight and judgment.  ____________________________________________   EKG I, Lisa Roca, MD, the attending physician have personally viewed and interpreted all ECGs.  68 bpm. Normal sinus rhythm. Narrow QRS. Normal axis. Nonspecific T-wave ____________________________________________  LABS (pertinent positives/negatives)  Labs Reviewed  BASIC METABOLIC PANEL - Abnormal; Notable for the following:       Result Value   Potassium 3.0 (*)    Glucose, Bld 142 (*)    All other components within normal limits  CBC - Abnormal; Notable for the following:    RBC 3.79 (*)    All other components within normal limits  TROPONIN I    ____________________________________________  RADIOLOGY All Xrays were viewed by me. Imaging interpreted by Radiologist.  Chest x-ray two-view: No active cardiopulmonary disease. __________________________________________  PROCEDURES  Procedure(s) performed: None  Critical Care performed: None  ____________________________________________   ED COURSE / ASSESSMENT AND PLAN  Pertinent labs & imaging results that were available during my care of the patient were reviewed by me and considered in my medical decision making (see chart for details).   Ms. Siegel is here for chest discomfort, ongoing since last night and throughout the day today. Some associated symptoms of intermittent nausea, and occasional shortness of breath, but no pleuritic chest pain or palpitations or fever. No symptoms suggestive of gastritis or indigestion. No symptoms suggestive of PE or pneumonia clinically.  Patient relates she is probably having anxiety about her trip and flying in a plane coming up, but due to her history wanted to be checked for her  heart.  Her EKG is nonspecific, and her troponin is reassuring/negative.  Since she has had ongoing symptoms since last night, I don't think that she needs an additional or repeat troponin this point time.  She was given potassium supplement with mildly low potassium. She is given  a one-time Xanax tablet here for anxiety symptoms. I also gave her a full dose aspirin here in the emergency department.  She is on aspirin and Plavix at home.  I have asked her to follow with her cardiologist as well as her psychiatrist and primary care physician.  CONSULTATIONS:      Patient / Family / Caregiver informed of clinical course, medical decision-making process, and agree with plan.   I discussed return precautions, follow-up instructions, and discharged instructions with patient and/or family.  __________________________________________  Addended to include, patient stated that she would not be able to follow-up on Monday because she is leaving for that, as on Sunday. I spoke with the on-call cardiologist Dr. Stanford Breed regarding patient's nonspecific/atypical chest discomfort ongoing greater than 20 hours at this point in time, with unchanged nonspecific EKG and a negative troponin. He did not recommend hospitalization for any additional rule out or intervention.  I discussed this with the patient and family member, and she is relieved to be discharged home. ___________________________________________   FINAL CLINICAL IMPRESSION(S) / ED DIAGNOSES   Final diagnoses:  Anxiety  Chest pain, unspecified chest pain type  Hypokalemia              Note: This dictation was prepared with Dragon dictation. Any transcriptional errors that result from this process are unintentional    Lisa Roca, MD 06/19/16 [place], MD 06/19/16 1753

## 2016-06-19 NOTE — ED Notes (Signed)
Pt resting in bed, resp even and unlabored, pt in no acute distress 

## 2016-06-19 NOTE — ED Notes (Signed)
Pt resting in bed, family at bedside.

## 2016-06-19 NOTE — ED Triage Notes (Addendum)
Pt c/o SHOB and left scapular pain. Does have hx CVA and MI. Has central chest pain as well.  Pt eating sucker in triage and does not appear to be in any distress. Pt took 1 NTG and reports no relief. Feels like first MI per pt.

## 2016-06-22 ENCOUNTER — Telehealth: Payer: Self-pay | Admitting: Cardiovascular Disease

## 2016-06-22 NOTE — Telephone Encounter (Signed)
Lmov to call back ED fu with Dr Collene Mares Dub Mikes

## 2016-06-23 NOTE — Telephone Encounter (Signed)
ED fu from 06/22/16 seen   LMOV to call back and schedule

## 2016-06-30 DIAGNOSIS — E7800 Pure hypercholesterolemia, unspecified: Secondary | ICD-10-CM | POA: Diagnosis not present

## 2016-06-30 DIAGNOSIS — M170 Bilateral primary osteoarthritis of knee: Secondary | ICD-10-CM | POA: Diagnosis not present

## 2016-06-30 DIAGNOSIS — I10 Essential (primary) hypertension: Secondary | ICD-10-CM | POA: Diagnosis not present

## 2016-06-30 DIAGNOSIS — F329 Major depressive disorder, single episode, unspecified: Secondary | ICD-10-CM | POA: Diagnosis not present

## 2016-07-03 DIAGNOSIS — I214 Non-ST elevation (NSTEMI) myocardial infarction: Secondary | ICD-10-CM | POA: Diagnosis not present

## 2016-07-03 DIAGNOSIS — I10 Essential (primary) hypertension: Secondary | ICD-10-CM | POA: Diagnosis not present

## 2016-07-03 DIAGNOSIS — E7800 Pure hypercholesterolemia, unspecified: Secondary | ICD-10-CM | POA: Diagnosis not present

## 2016-07-03 DIAGNOSIS — F329 Major depressive disorder, single episode, unspecified: Secondary | ICD-10-CM | POA: Diagnosis not present

## 2016-07-03 DIAGNOSIS — I5020 Unspecified systolic (congestive) heart failure: Secondary | ICD-10-CM | POA: Diagnosis not present

## 2016-07-21 NOTE — Progress Notes (Deleted)
Cardiology Office Note Date:  07/21/2016  Patient ID:  [name] 1955/12/23, MRN [id number] PCP:  Leonel Ramsay, MD  Cardiologist:  Dr. Rockey Situ, MD  ***refresh   Chief Complaint: ED follow up  History of Present Illness: [name] is a 60 y.o. female with history of CAD with history of unstable angina in 12/2013 s/p PCI/DES to distal RCA, chronic systolic CHF/ICM, stroke in 08/2013, prior tobacco abuse, HTN, HLD, prior syncope, anxiety, and depression who presents for ED follow up of chest pain.   Prior cardiac cath in 03/2013 in the setting of a NSTEMI showed an occluded RCA, EF 45%. Eedical management was advised. Echo at that time shoed an EF of 30-35%, moderate to severe MR, moderate TR. Admitted in 08/2013 with acute CVA. Echo at that time showed improved EF to > 55% with no significant valvular disease. Returned to the hospital in 12/2013 with unstable angina. Repeatcardiac cath showed mid LAD 40%, D1 80%, D2 60%, LCx normal, ostial to proximal RCA 50%, distal RCA 95% s/p PCI/DES. She had recurrent chest pain in 05/2014, underwent Lexiscan that showed no ischemia, EF 68%. She was last seen by Dr. Rockey Situ in 12/2015 for follow up of her CAD. At that time she noted continued chest pain, worse with positional movements with associated SOB. PCP had increased her Lasix without improvement in her symptoms. She underwent stress echo that suboptimal as she did not achieve target heart rate. Read as normal study after her maxiaml exercise. Seen recently in the ED on 06/19/16 for SOB and chest discomfort. She was uncertain if this was anginal in nature vs her anxiety. Ruled out. Potassium was repleted. She was advised to follow up with [place], psych, and PCP.    Past Medical History:  Diagnosis Date  . Anxiety   . CAD (coronary artery disease)    a. 03/2013 NSTEMI/Cath: 100 RCA, EF 45%->Med Rx;  b. 12/2013 Cath/PCI: LAD 56m, 30d, D1 80, D2 60, LCX nl, RCA 50ost/p, 51m, 95d (2.5x33 Xience DES);   c. 05/2014 Lexi MV: EF 68%, no ischemia.  . Carotid disease, bilateral ([place])    a. 08/2013 Carotid U/S: 1-39% bilat ICA stenosis.  Marland Kitchen CVA (cerebral vascular accident) ([place])    a. 08/2013.  [name]   . Depression   . History of recurrent UTIs   . Hypercholesteremia   . Hyperlipemia   . Hypertension   . Insomnia   . Ischemic cardiomyopathy    a. 03/2013 Echo: EF 30-35%, mod dil LA, mod-sev MR, mod TR;  b. 08/2013 Echo EF 60-65%, mild AI.  Marland Kitchen Menopause   . Migraines   . Myocardial infarction ([place])   . Right lower quadrant pain   . Syncope and collapse   . Tobacco abuse   . Vaginal atrophy     Past Surgical History:  Procedure Laterality Date  . CARDIAC CATHETERIZATION  01/01/2014  . CARDIAC CATHETERIZATION  03/16/2013  . CARDIAC CATHETERIZATION  12/2013  . CESAREAN SECTION    . CESAREAN SECTION WITH BILATERAL TUBAL LIGATION    . CORONARY ANGIOPLASTY WITH STENT PLACEMENT  01/01/2014   95% lesion with a drug eluting stent to the distal RCA.  Marland Kitchen ENDOMETRIAL ABLATION    . KNEE ARTHROSCOPY  11/27/2006   left knee   . OOPHORECTOMY    . TUBAL LIGATION      Current Outpatient Prescriptions  Medication Sig Dispense Refill  . acetaminophen (TYLENOL) 500 MG tablet Take 1,000 mg  by mouth every 6 (six) hours as needed for mild pain or moderate pain.    Marland Kitchen aspirin 81 MG tablet Take 81 mg by mouth daily.    Marland Kitchen atorvastatin (LIPITOR) 80 MG tablet Take 80 mg by mouth daily.     . Brexpiprazole (REXULTI) 3 MG TABS Take by mouth daily.    . clopidogrel (PLAVIX) 75 MG tablet TAKE 1 TABLET BY MOUTH DAILY 90 tablet 4  . DULoxetine (CYMBALTA) 60 MG capsule Take 60 mg by mouth daily.    Marland Kitchen ezetimibe (ZETIA) 10 MG tablet Take 1 tablet (10 mg total) by mouth daily. 30 tablet 11  . furosemide (LASIX) 20 MG tablet     . hydrOXYzine (ATARAX/VISTARIL) 25 MG tablet TK 1/2 TO 1 T PO BID PRA  3  . isosorbide mononitrate (IMDUR) 60 MG 24 hr tablet Take 30 mg by mouth daily.     . metoprolol succinate  (TOPROL-XL) 25 MG 24 hr tablet Take 12.5 mg by mouth every morning.    . nitrofurantoin, macrocrystal-monohydrate, (MACROBID) 100 MG capsule Take 1 capsule (100 mg total) by mouth at bedtime. 90 capsule 4  . nitroGLYCERIN (NITROSTAT) 0.4 MG SL tablet Place 1 tablet (0.4 mg total) under the tongue every 5 (five) minutes as needed for chest pain. 25 tablet 6  . pantoprazole (PROTONIX) 40 MG tablet Take 40 mg by mouth every 12 (twelve) hours.    . potassium chloride SA (K-DUR,KLOR-CON) 20 MEQ tablet   11  . ranolazine (RANEXA) 500 MG 12 hr tablet Take 1 tablet (500 mg total) by mouth 2 (two) times daily. 180 tablet 3  . sertraline (ZOLOFT) 100 MG tablet Take 200 mg by mouth daily.     . traZODone (DESYREL) 100 MG tablet   3  . triamterene-hydrochlorothiazide (DYAZIDE) 37.5-25 MG capsule TK 1 C PO QAM  5   No current facility-administered medications for this visit.     Allergies:   Codeine sulfate and Penicillins   Social History:  The patient  reports that she quit smoking about 3 years ago. Her smoking use included Cigarettes. She has a 5.00 pack-year smoking history. She does not have any smokeless tobacco history on file. She reports that she does not drink alcohol or use drugs.   Family History:  The patient's family history includes Breast cancer in her paternal grandmother; Hyperlipidemia in her mother; Hypertension in her mother; Stroke in her mother.  ROS:   ROS   PHYSICAL EXAM: *** VS:  There were no vitals taken for this visit. BMI: There is no height or weight on file to calculate BMI.  Physical Exam   EKG:  Was ordered and interpreted by me today. Shows ***  Recent Labs: 06/19/2016: BUN 13; Creatinine, Ser 0.64; Hemoglobin 12.1; Platelets 273; Potassium 3.0; Sodium 139  No results found for requested labs within last 8760 hours.   CrCl cannot be calculated (Unknown ideal weight.).   Wt Readings from Last 3 Encounters:  06/19/16 132 lb (59.9 kg)  02/11/16 139 lb 14.4 oz  (63.5 kg)  01/07/16 140 lb 12 oz (63.8 kg)     Other studies reviewed: Additional studies/records reviewed today include: summarized above  ASSESSMENT AND PLAN:  1. ***  Disposition: F/u with *** in   Current medicines are reviewed at length with the patient today.  The patient did not have any concerns regarding medicines.  Melvern Banker PA-C 07/21/2016 5:10 PM     [facility] [address]  [place], [place] [address] [phone]

## 2016-07-22 ENCOUNTER — Ambulatory Visit: Payer: PPO | Admitting: Physician Assistant

## 2016-07-27 ENCOUNTER — Other Ambulatory Visit: Payer: Self-pay | Admitting: Surgery

## 2016-07-27 DIAGNOSIS — M170 Bilateral primary osteoarthritis of knee: Secondary | ICD-10-CM | POA: Diagnosis not present

## 2016-07-31 DIAGNOSIS — F410 Panic disorder [episodic paroxysmal anxiety] without agoraphobia: Secondary | ICD-10-CM | POA: Diagnosis not present

## 2016-07-31 DIAGNOSIS — F332 Major depressive disorder, recurrent severe without psychotic features: Secondary | ICD-10-CM | POA: Diagnosis not present

## 2016-08-06 ENCOUNTER — Ambulatory Visit
Admission: RE | Admit: 2016-08-06 | Discharge: 2016-08-06 | Disposition: A | Discharge status: Home / Self Care | Payer: PPO | Source: Ambulatory Visit | Attending: Surgery | Admitting: Surgery

## 2016-08-06 DIAGNOSIS — M25462 Effusion, left knee: Secondary | ICD-10-CM | POA: Insufficient documentation

## 2016-08-06 DIAGNOSIS — Z9889 Other specified postprocedural states: Secondary | ICD-10-CM | POA: Diagnosis not present

## 2016-08-06 DIAGNOSIS — M7122 Synovial cyst of popliteal space [Baker], left knee: Secondary | ICD-10-CM | POA: Diagnosis not present

## 2016-08-06 DIAGNOSIS — M170 Bilateral primary osteoarthritis of knee: Secondary | ICD-10-CM | POA: Insufficient documentation

## 2016-08-06 DIAGNOSIS — S83282A Other tear of lateral meniscus, current injury, left knee, initial encounter: Secondary | ICD-10-CM | POA: Insufficient documentation

## 2016-08-06 DIAGNOSIS — X58XXXA Exposure to other specified factors, initial encounter: Secondary | ICD-10-CM | POA: Diagnosis not present

## 2016-08-06 DIAGNOSIS — M25562 Pain in left knee: Secondary | ICD-10-CM | POA: Diagnosis not present

## 2016-08-07 ENCOUNTER — Encounter: Payer: Self-pay | Admitting: Physician Assistant

## 2016-08-07 ENCOUNTER — Ambulatory Visit (INDEPENDENT_AMBULATORY_CARE_PROVIDER_SITE_OTHER): Payer: PPO | Admitting: Physician Assistant

## 2016-08-07 VITALS — BP 120/72 | HR 89 | Ht 62.0 in | Wt 139.0 lb

## 2016-08-07 DIAGNOSIS — I10 Essential (primary) hypertension: Secondary | ICD-10-CM

## 2016-08-07 DIAGNOSIS — I200 Unstable angina: Secondary | ICD-10-CM | POA: Diagnosis not present

## 2016-08-07 DIAGNOSIS — I5022 Chronic systolic (congestive) heart failure: Secondary | ICD-10-CM

## 2016-08-07 DIAGNOSIS — E785 Hyperlipidemia, unspecified: Secondary | ICD-10-CM | POA: Diagnosis not present

## 2016-08-07 DIAGNOSIS — I209 Angina pectoris, unspecified: Secondary | ICD-10-CM | POA: Diagnosis not present

## 2016-08-07 DIAGNOSIS — I2510 Atherosclerotic heart disease of native coronary artery without angina pectoris: Secondary | ICD-10-CM

## 2016-08-07 MED ORDER — RANOLAZINE ER 500 MG PO TB12: 500.0000 mg | ORAL_TABLET | Freq: Two times a day (BID) | ORAL | 6 refills | Status: DC

## 2016-08-07 MED ORDER — ISOSORBIDE MONONITRATE ER 60 MG PO TB24
60.0000 mg | ORAL_TABLET | Freq: Every day | ORAL | 6 refills | Status: DC
Start: 2016-08-07 — End: 2017-05-27

## 2016-08-07 NOTE — Progress Notes (Signed)
Cardiology Office Note Date:  08/07/2016  Patient ID:  [name], [name] 07/26/1956, MRN [id number] PCP:  Leonel Ramsay, MD  Cardiologist:  Dr. Rockey Situ, MD    Chief Complaint: ED follow up from visit on 06/19/16  History of Present Illness: [name] is a 60 y.o. female with history of CAD with history of unstable angina in 12/2013 s/p PCI/DES to distal RCA, chronic systolic CHF/ICM now with normalized EF by echo 02/2015, stroke in 08/2013, prior tobacco abuse, HTN, HLD, prior syncope, anxiety, and depression who presents for follow up of ED visit in mid August.   Prior cardiac cath in 03/2013 in the setting of a NSTEMI showed an occluded RCA, EF 45%. Medical management was advised. Echo at that time shoed an EF of 30-35%, moderate to severe MR, moderate TR. Admitted in 08/2013 with acute CVA. Echo around that time in 02/2015 showed improved EF to > 55% with no significant valvular disease. Returned to the hospital in 12/2013 with unstable angina. Repeat cardiac cath showed mid LAD 40%, D1 80%, D2 60%, LCx normal, ostial to proximal RCA 50%, distal RCA 95% s/p PCI/DES. She had recurrent chest pain in 05/2014, underwent Lexiscan that showed no ischemia, EF 68%. She was last seen by Dr. Rockey Situ in 12/2015 for follow up of her CAD. At that time she noted continued chest pain, worse with positional movements with associated SOB. PCP had increased her Lasix without improvement in her symptoms. She underwent stress echo that was suboptimal as she did not achieve target heart rate. Read as normal study after her maximal exercise. Seen recently in the ED on 06/19/16 for SOB and chest discomfort. She was uncertain if this was anginal in nature vs her anxiety. Ruled out. Potassium was repleted. She was advised to follow up with [place], psych, and PCP.  She comes in stating she had continued to have chest pain with associated SOB and nausea since her ED visit. Symptoms are intermittent and nonexertional. Pain will  last 5-10 minutes before self resolving. She also notes an increase in SOB and generalized decline in functional capacity, at times having to stop to take a break when walking up the stairs to her apartment. She has continued to take ASA, Plavix, Imdur, and Ranexa at home. This pain feels similar to her prior NSTEMI in 2014, though different than the pain she was experiencing in 12/2015. She is back to smoking, using E-cigarettes since 10/2015 secondary to increased stress in her home life. She is currently without symptoms at this time.   She also notes that she is in the process of having her left knee evaluated for possible replacement. MRI pending.    Past Medical History:  Diagnosis Date  . Anxiety   . CAD (coronary artery disease)    a. 03/2013 NSTEMI/Cath: 100 RCA, EF 45%->Med Rx;  b. 12/2013 Cath/PCI: LAD 35m, 30d, D1 80, D2 60, LCX nl, RCA 50ost/p, 59m, 95d (2.5x33 Xience DES);  c. 05/2014 Lexi MV: EF 68%, no ischemia; d. stress echo 12/2015 no ischemia @ max exercise (did not achieve target)  . Carotid disease, bilateral ([place])    a. 08/2013 Carotid U/S: 1-39% bilat ICA stenosis.  Marland Kitchen CVA (cerebral vascular accident) ([place])    a. 08/2013.  [name]   . Depression   . History of recurrent UTIs   . Hypercholesteremia   . Hyperlipemia   . Hypertension   . Insomnia   . Ischemic cardiomyopathy  a. 03/2013 Echo: EF 30-35%, mod dil LA, mod-sev MR, mod TR;  b. 08/2013 Echo EF 60-65%, mild AI.  Marland Kitchen Menopause   . Migraines   . Myocardial infarction ([place])   . Right lower quadrant pain   . Syncope and collapse   . Tobacco abuse   . Vaginal atrophy     Past Surgical History:  Procedure Laterality Date  . CARDIAC CATHETERIZATION  01/01/2014  . CARDIAC CATHETERIZATION  03/16/2013  . CARDIAC CATHETERIZATION  12/2013  . CESAREAN SECTION    . CESAREAN SECTION WITH BILATERAL TUBAL LIGATION    . CORONARY ANGIOPLASTY WITH STENT PLACEMENT  01/01/2014   95% lesion with a drug eluting stent  to the distal RCA.  Marland Kitchen ENDOMETRIAL ABLATION    . KNEE ARTHROSCOPY  11/27/2006   left knee   . OOPHORECTOMY    . TUBAL LIGATION      Current Outpatient Prescriptions  Medication Sig Dispense Refill  . acetaminophen (TYLENOL) 500 MG tablet Take 1,000 mg by mouth every 6 (six) hours as needed for mild pain or moderate pain.    Marland Kitchen aspirin 81 MG tablet Take 81 mg by mouth daily.    Marland Kitchen atorvastatin (LIPITOR) 80 MG tablet Take 80 mg by mouth daily.     . Brexpiprazole (REXULTI) 3 MG TABS Take by mouth daily.    . clopidogrel (PLAVIX) 75 MG tablet TAKE 1 TABLET BY MOUTH DAILY 90 tablet 4  . DULoxetine (CYMBALTA) 60 MG capsule Take 60 mg by mouth daily.    Marland Kitchen ezetimibe (ZETIA) 10 MG tablet Take 1 tablet (10 mg total) by mouth daily. 30 tablet 11  . furosemide (LASIX) 20 MG tablet     . hydrOXYzine (ATARAX/VISTARIL) 25 MG tablet TK 1/2 TO 1 T PO BID PRA  3  . isosorbide mononitrate (IMDUR) 60 MG 24 hr tablet Take 30 mg by mouth daily.     . metoprolol succinate (TOPROL-XL) 25 MG 24 hr tablet Take 12.5 mg by mouth every morning.    . nitrofurantoin, macrocrystal-monohydrate, (MACROBID) 100 MG capsule Take 1 capsule (100 mg total) by mouth at bedtime. 90 capsule 4  . nitroGLYCERIN (NITROSTAT) 0.4 MG SL tablet Place 1 tablet (0.4 mg total) under the tongue every 5 (five) minutes as needed for chest pain. 25 tablet 6  . pantoprazole (PROTONIX) 40 MG tablet Take 40 mg by mouth every 12 (twelve) hours.    . potassium chloride SA (K-DUR,KLOR-CON) 20 MEQ tablet Take 40 mEq by mouth daily.   11  . ranolazine (RANEXA) 500 MG 12 hr tablet Take 1 tablet (500 mg total) by mouth 2 (two) times daily. 180 tablet 3  . sertraline (ZOLOFT) 100 MG tablet Take 200 mg by mouth daily.     . traZODone (DESYREL) 100 MG tablet   3  . triamterene-hydrochlorothiazide (DYAZIDE) 37.5-25 MG capsule TK 1 C PO QAM  5   No current facility-administered medications for this visit.     Allergies:   Codeine sulfate and Penicillins    Social History:  The patient  reports that she quit smoking about 3 years ago. Her smoking use included Cigarettes. She has a 5.00 pack-year smoking history. She has never used smokeless tobacco. She reports that she does not drink alcohol or use drugs.   Family History:  The patient's family history includes Breast cancer in her paternal grandmother; Hyperlipidemia in her mother; Hypertension in her mother; Stroke in her mother.  ROS:   Review of Systems  Constitutional: Positive for malaise/fatigue. Negative for chills, diaphoresis, fever and weight loss.  HENT: Negative for congestion.   Eyes: Negative for discharge and redness.  Respiratory: Positive for shortness of breath. Negative for cough, hemoptysis, sputum production and wheezing.   Cardiovascular: Positive for chest pain. Negative for palpitations, orthopnea, claudication, leg swelling and PND.  Gastrointestinal: Positive for nausea. Negative for abdominal pain, blood in stool, heartburn, melena and vomiting.  Genitourinary: Negative for hematuria.  Musculoskeletal: Negative for falls and myalgias.  Skin: Negative for rash.  Neurological: Positive for weakness. Negative for dizziness, tingling, tremors, sensory change, speech change, focal weakness and loss of consciousness.  Endo/Heme/Allergies: Does not bruise/bleed easily.  Psychiatric/Behavioral: Negative for substance abuse. The patient is not nervous/anxious.   All other systems reviewed and are negative.    PHYSICAL EXAM:  VS:  BP 120/72 (BP Location: Left Arm, Patient Position: Sitting, Cuff Size: Normal)   Pulse 89   Ht 5\' 2"  (1.575 m)   Wt 139 lb (63 kg)   BMI 25.42 kg/m  BMI: Body mass index is 25.42 kg/m.  Physical Exam  Constitutional: She is oriented to person, place, and time. She appears well-developed and well-nourished.  HENT:  Head: Normocephalic and atraumatic.  Eyes: Right eye exhibits no discharge. Left eye exhibits no discharge.  Neck: Normal  range of motion. No JVD present.  Cardiovascular: Normal rate, regular rhythm, S1 normal, S2 normal and normal heart sounds.  Exam reveals no distant heart sounds, no friction rub, no midsystolic click and no opening snap.   No murmur heard. Pulmonary/Chest: Effort normal and breath sounds normal. No respiratory distress. She has no decreased breath sounds. She has no wheezes. She has no rales. She exhibits no tenderness.  Abdominal: Soft. She exhibits no distension. There is no tenderness.  Musculoskeletal: She exhibits no edema.  Neurological: She is alert and oriented to person, place, and time.  Skin: Skin is warm and dry. No cyanosis. Nails show no clubbing.  Psychiatric: She has a normal mood and affect. Her speech is normal and behavior is normal. Judgment and thought content normal.     EKG:  Was ordered and interpreted by me today. Shows NSR, 89 bpm, rare PVC, lateral TWI  Recent Labs: 06/19/2016: BUN 13; Creatinine, Ser 0.64; Hemoglobin 12.1; Platelets 273; Potassium 3.0; Sodium 139  No results found for requested labs within last 8760 hours.   CrCl cannot be calculated (Patient's most recent lab result is older than the maximum 21 days allowed.).   Wt Readings from Last 3 Encounters:  08/07/16 139 lb (63 kg)  06/19/16 132 lb (59.9 kg)  02/11/16 139 lb 14.4 oz (63.5 kg)     Other studies reviewed: Additional studies/records reviewed today include: summarized above  ASSESSMENT AND PLAN:  1. Unstable angina/CAD as above: Currently without pain. Schedule LHC with Dr. Rockey Situ, patient prefers cardiac cath over nuclear stress testing. This will also allow for adequate cardiac clearance evaluation for her possible pending knee replacement surgery, though if she needs intervention her elective knee surgery will have to be postponed. Increase Imdur to 60 mg daily and Ranexa to 1000 mg bid. Continue ASA 81 mg, Plavix 75 mg daily, and Toprol XL 12.5 mg daily. Check pre-cath labs. Risks  and benefits of cardiac catheterization have been discussed with the patient including risks of bleeding, bruising, infection, kidney damage, stroke, heart attack, and death. The patient understands these risks and is willing to proceed with the procedure. All questions have been answered  and concerns listened to.    2. Chronic systolic CHF: She does not appear volume overloaded at this time. EF now normalized by echo 12/2015. Continue Lasix 20 mg daily with KCl repletion as well as Toprol XL as above.   3. HTN: Well controlled. Continue current medications.   4. HLD: Continue Lipitor 80 mg daily. LDL 69 from 02/2015. Needs fasting lipid and LFT.   Disposition: F/u with me 1 week s/p cardiac cath.   Current medicines are reviewed at length with the patient today.  The patient did not have any concerns regarding medicines.  Melvern Banker PA-C 08/07/2016 2:14 PM     [address], [place] [address] [phone]

## 2016-08-07 NOTE — Patient Instructions (Addendum)
Medication Instructions:  Please INCREASE your Imdur to 60 mg daily Please INCREASE your Ranexa to 1000 mg twice daily  Labwork: BMET, CBC, PT/INR  Testing/Procedures: [facility] Cardiac Cath Instructions   You are scheduled for a Cardiac Cath on:__Wednesday, October 11 _______  Please arrive at _7:30_am on the day of your procedure  Please expect a call from our [facility] to pre-register you  Do not eat/drink anything after midnight  Someone will need to drive you home  It is recommended someone be with you for the first 24 hours after your procedure  Wear clothes that are easy to get on/off and wear slip on shoes if possible   Medications bring a current list of all medications with you  _X_ Do not take these medications before your procedure: LASIX  Day of your procedure: Arrive at the [facility] entrance.  Free valet service is available.  After entering the [facility] please check-in at the registration desk (1st desk on your right) to receive your armband. After receiving your armband someone will escort you to the cardiac cath/special procedures waiting area.  The usual length of stay after your procedure is about 2 to 3 hours.  This can vary.  If you have any questions, please call our office at [phone], or you may call the cardiac cath lab at [facility] directly at [phone]  Follow-Up: 1-2 weeks after your cath  If you need a refill on your cardiac medications before your next appointment, please call your pharmacy.   Angiogram An angiogram, also called angiography, is a procedure used to look at the blood vessels. In this procedure, dye is injected through a long, thin tube (catheter) into an artery. X-rays are then taken. The X-rays will show if there is a blockage or problem in a blood vessel.  LET [facility] CARE PROVIDER KNOW ABOUT:  Any allergies you have, including allergies to shellfish or contrast dye.   All medicines you are  taking, including vitamins, herbs, eye drops, creams, and over-the-counter medicines.   Previous problems you or members of your family have had with the use of anesthetics.   Any blood disorders you have.   Previous surgeries you have had.  Any previous kidney problems or failure you have had.  Medical conditions you have.   Possibility of pregnancy, if this applies. RISKS AND COMPLICATIONS Generally, an angiogram is a safe procedure. However, as with any procedure, problems can occur. Possible problems include:  Injury to the blood vessels, including rupture or bleeding.  Infection or bruising at the catheter site.  Allergic reaction to the dye or contrast used.  Kidney damage from the dye or contrast used.  Blood clots that can lead to a stroke or heart attack. BEFORE THE PROCEDURE  Do not eat or drink after midnight on the night before the procedure, or as directed by your health care provider.   Ask your health care provider if you may drink enough water to take any needed medicines the morning of the procedure.  PROCEDURE  You may be given a medicine to help you relax (sedative) before and during the procedure. This medicine is given through an IV access tube that is inserted into one of your veins.   The area where the catheter will be inserted will be washed and shaved. This is usually done in the groin but may be done in the fold of your arm (near your elbow) or in the wrist.  A medicine will  be given to numb the area where the catheter will be inserted (local anesthetic).  The catheter will be inserted with a guide wire into an artery. The catheter is guided by using a type of X-ray (fluoroscopy) to the blood vessel being examined.   Dye is then injected into the catheter, and X-rays are taken. The dye helps to show where any narrowing or blockages are located.  AFTER THE PROCEDURE   If the procedure is done through the leg, you will be kept in bed lying  flat for several hours. You will be instructed to not bend or cross your legs.  The insertion site will be checked frequently.  The pulse in your feet or wrist will be checked frequently.  Additional blood tests, X-rays, and electrocardiography may be done.   You may need to stay in the hospital overnight for observation.    This information is not intended to replace advice given to you by your health care provider. Make sure you discuss any questions you have with your health care provider.   Document Released: 08/05/2005 Document Revised: 11/16/2014 Document Reviewed: 03/29/2013 Elsevier Interactive Patient Education 2016 [place] Refer to this sheet in the next few weeks. These instructions provide you with information about caring for yourself after your procedure. Your health care provider may also give you more specific instructions. Your treatment has been planned according to current medical practices, but problems sometimes occur. Call your health care provider if you have any problems or questions after your procedure. WHAT TO EXPECT AFTER THE PROCEDURE After your procedure, it is typical to have the following:  Bruising at the catheter insertion site that usually fades within 1-2 weeks.  Blood collecting in the tissue (hematoma) that may be painful to the touch. It should usually decrease in size and tenderness within 1-2 weeks. HOME CARE INSTRUCTIONS  Take medicines only as directed by your health care provider.  You may shower 24-48 hours after the procedure or as directed by your health care provider. Remove the bandage (dressing) and gently wash the site with plain soap and water. Pat the area dry with a clean towel. Do not rub the site, because this may cause bleeding.  Do not take baths, swim, or use a hot tub until your health care provider approves.  Check your insertion site every day for redness, swelling, or drainage.  Do not apply  powder or lotion to the site.  Do not lift over 10 lb (4.5 kg) for 5 days after your procedure or as directed by your health care provider.  Ask your health care provider when it is okay to:  Return to work or school.  Resume usual physical activities or sports.  Resume sexual activity.  Do not drive home if you are discharged the same day as the procedure. Have someone else drive you.  You may drive 24 hours after the procedure unless otherwise instructed by your health care provider.  Do not operate machinery or power tools for 24 hours after the procedure or as directed by your health care provider.  If your procedure was done as an outpatient procedure, which means that you went home the same day as your procedure, a responsible adult should be with you for the first 24 hours after you arrive home.  Keep all follow-up visits as directed by your health care provider. This is important. SEEK MEDICAL CARE IF:  You have a fever.  You have chills.  You have  increased bleeding from the catheter insertion site. Hold pressure on the site. SEEK IMMEDIATE MEDICAL CARE IF:  You have unusual pain at the catheter insertion site.  You have redness, warmth, or swelling at the catheter insertion site.  You have drainage (other than a small amount of blood on the dressing) from the catheter insertion site.  The catheter insertion site is bleeding, and the bleeding does not stop after 30 minutes of holding steady pressure on the site.  The area near or just beyond the catheter insertion site becomes pale, cool, tingly, or numb.   This information is not intended to replace advice given to you by your health care provider. Make sure you discuss any questions you have with your health care provider.   Document Released: 05/14/2005 Document Revised: 11/16/2014 Document Reviewed: 03/29/2013 Elsevier Interactive Patient Education [employer].

## 2016-08-08 LAB — BASIC METABOLIC PANEL
BUN / CREAT RATIO: 11 (ref 9–23)
BUN: 9 mg/dL (ref 6–24)
CO2: 24 mmol/L (ref 18–29)
CREATININE: 0.79 mg/dL (ref 0.57–1.00)
Calcium: 9.8 mg/dL (ref 8.7–10.2)
Chloride: 102 mmol/L (ref 96–106)
GFR calc Af Amer: 95 mL/min/{1.73_m2} (ref >59)
GFR, EST NON AFRICAN AMERICAN: 82 mL/min/{1.73_m2} (ref >59)
Glucose: 133 mg/dL — ABNORMAL HIGH (ref 65–99)
Potassium: 3.6 mmol/L (ref 3.5–5.2)
SODIUM: 142 mmol/L (ref 134–144)

## 2016-08-08 LAB — CBC WITH DIFFERENTIAL/PLATELET
BASOS: 0 %
Basophils Absolute: 0 10*3/uL (ref 0.0–0.2)
EOS (ABSOLUTE): 0.3 10*3/uL (ref 0.0–0.4)
EOS: 3 %
HEMATOCRIT: 36.3 % (ref 34.0–46.6)
HEMOGLOBIN: 12.4 g/dL (ref 11.1–15.9)
IMMATURE GRANULOCYTES: 0 %
Immature Grans (Abs): 0 10*3/uL (ref 0.0–0.1)
LYMPHS ABS: 2 10*3/uL (ref 0.7–3.1)
Lymphs: 23 %
MCH: 31.1 pg (ref 26.6–33.0)
MCHC: 34.2 g/dL (ref 31.5–35.7)
MCV: 91 fL (ref 79–97)
MONOCYTES: 8 %
MONOS ABS: 0.7 10*3/uL (ref 0.1–0.9)
Neutrophils Absolute: 5.8 10*3/uL (ref 1.4–7.0)
Neutrophils: 66 %
Platelets: 345 10*3/uL (ref 150–379)
RBC: 3.99 x10E6/uL (ref 3.77–5.28)
RDW: 13.4 % (ref 12.3–15.4)
WBC: 8.8 10*3/uL (ref 3.4–10.8)

## 2016-08-08 LAB — PROTIME-INR
INR: 1 (ref 0.8–1.2)
PROTHROMBIN TIME: 10.5 s (ref 9.1–12.0)

## 2016-08-12 DIAGNOSIS — Z1231 Encounter for screening mammogram for malignant neoplasm of breast: Secondary | ICD-10-CM | POA: Diagnosis not present

## 2016-08-18 ENCOUNTER — Telehealth: Payer: Self-pay | Admitting: Cardiovascular Disease

## 2016-08-18 NOTE — Telephone Encounter (Signed)
Per Dr. Rockey Situ, move pt's cath to 10:30 tomorrow.  Left message w/ scheduling for this change.  Left message on pt's vm that she will need to arrive @ the [facility] @ 9:30. Reviewed pre-cath instructions. Asked pt to call back if this time will not work or is she has any questions or concerns.

## 2016-08-19 ENCOUNTER — Ambulatory Visit
Admission: RE | Admit: 2016-08-19 | Discharge: 2016-08-19 | Disposition: A | Discharge status: Home / Self Care | Payer: PPO | Source: Ambulatory Visit | Attending: Cardiovascular Disease | Admitting: Cardiovascular Disease

## 2016-08-19 ENCOUNTER — Encounter: Payer: Self-pay | Admitting: Cardiovascular Disease

## 2016-08-19 ENCOUNTER — Encounter
Admission: RE | Disposition: A | Discharge status: Home / Self Care | Payer: Self-pay | Source: Ambulatory Visit | Attending: Cardiovascular Disease

## 2016-08-19 DIAGNOSIS — F419 Anxiety disorder, unspecified: Secondary | ICD-10-CM | POA: Diagnosis not present

## 2016-08-19 DIAGNOSIS — Z79899 Other long term (current) drug therapy: Secondary | ICD-10-CM | POA: Insufficient documentation

## 2016-08-19 DIAGNOSIS — I110 Hypertensive heart disease with heart failure: Secondary | ICD-10-CM | POA: Diagnosis not present

## 2016-08-19 DIAGNOSIS — I5022 Chronic systolic (congestive) heart failure: Secondary | ICD-10-CM | POA: Diagnosis not present

## 2016-08-19 DIAGNOSIS — Z7902 Long term (current) use of antithrombotics/antiplatelets: Secondary | ICD-10-CM | POA: Diagnosis not present

## 2016-08-19 DIAGNOSIS — E785 Hyperlipidemia, unspecified: Secondary | ICD-10-CM | POA: Insufficient documentation

## 2016-08-19 DIAGNOSIS — Z8673 Personal history of transient ischemic attack (TIA), and cerebral infarction without residual deficits: Secondary | ICD-10-CM | POA: Insufficient documentation

## 2016-08-19 DIAGNOSIS — E7800 Pure hypercholesterolemia, unspecified: Secondary | ICD-10-CM

## 2016-08-19 DIAGNOSIS — F329 Major depressive disorder, single episode, unspecified: Secondary | ICD-10-CM | POA: Diagnosis not present

## 2016-08-19 DIAGNOSIS — I25110 Atherosclerotic heart disease of native coronary artery with unstable angina pectoris: Secondary | ICD-10-CM | POA: Insufficient documentation

## 2016-08-19 DIAGNOSIS — Z955 Presence of coronary angioplasty implant and graft: Secondary | ICD-10-CM

## 2016-08-19 DIAGNOSIS — Z87891 Personal history of nicotine dependence: Secondary | ICD-10-CM | POA: Insufficient documentation

## 2016-08-19 DIAGNOSIS — I25118 Atherosclerotic heart disease of native coronary artery with other forms of angina pectoris: Secondary | ICD-10-CM

## 2016-08-19 DIAGNOSIS — I252 Old myocardial infarction: Secondary | ICD-10-CM | POA: Diagnosis not present

## 2016-08-19 DIAGNOSIS — I209 Angina pectoris, unspecified: Secondary | ICD-10-CM | POA: Diagnosis present

## 2016-08-19 DIAGNOSIS — I255 Ischemic cardiomyopathy: Secondary | ICD-10-CM | POA: Insufficient documentation

## 2016-08-19 DIAGNOSIS — Z7982 Long term (current) use of aspirin: Secondary | ICD-10-CM | POA: Insufficient documentation

## 2016-08-19 DIAGNOSIS — I200 Unstable angina: Secondary | ICD-10-CM | POA: Diagnosis not present

## 2016-08-19 HISTORY — PX: CARDIAC CATHETERIZATION: SHX172

## 2016-08-19 SURGERY — LEFT HEART CATH AND CORONARY ANGIOGRAPHY
Anesthesia: Moderate Sedation

## 2016-08-19 MED ORDER — MIDAZOLAM HCL 2 MG/2ML IJ SOLN
INTRAMUSCULAR | Status: AC
  Filled 2016-08-19: qty 2

## 2016-08-19 MED ORDER — MIDAZOLAM HCL 2 MG/2ML IJ SOLN
INTRAMUSCULAR | Status: DC | PRN
  Administered 2016-08-19: 1 mg via INTRAVENOUS

## 2016-08-19 MED ORDER — ASPIRIN 81 MG PO CHEW: 81.0000 mg | CHEWABLE_TABLET | ORAL | Status: DC

## 2016-08-19 MED ORDER — SODIUM CHLORIDE 0.9 % WEIGHT BASED INFUSION
3.0000 mL/kg/h | INTRAVENOUS | Status: DC
  Administered 2016-08-19: 3 mL/kg/h via INTRAVENOUS

## 2016-08-19 MED ORDER — NITROGLYCERIN 1 MG/10 ML FOR IR/CATH LAB
INTRA_ARTERIAL | Status: DC | PRN
  Administered 2016-08-19: 200 ug via INTRACORONARY

## 2016-08-19 MED ORDER — IOPAMIDOL (ISOVUE-300) INJECTION 61%
INTRAVENOUS | Status: DC | PRN
  Administered 2016-08-19: 110 mL via INTRA_ARTERIAL

## 2016-08-19 MED ORDER — ACETAMINOPHEN 325 MG PO TABS: 650.0000 mg | ORAL_TABLET | ORAL | Status: DC | PRN

## 2016-08-19 MED ORDER — FENTANYL CITRATE (PF) 100 MCG/2ML IJ SOLN
INTRAMUSCULAR | Status: DC | PRN
  Administered 2016-08-19: 25 ug via INTRAVENOUS

## 2016-08-19 MED ORDER — HEPARIN (PORCINE) IN NACL 2-0.9 UNIT/ML-% IJ SOLN
INTRAMUSCULAR | Status: AC
  Filled 2016-08-19: qty 1000

## 2016-08-19 MED ORDER — ONDANSETRON HCL 4 MG/2ML IJ SOLN: 4.0000 mg | Freq: Four times a day (QID) | INTRAMUSCULAR | Status: DC | PRN

## 2016-08-19 MED ORDER — SODIUM CHLORIDE 0.9 % WEIGHT BASED INFUSION: 3.0000 mL/kg/h | INTRAVENOUS | Status: DC

## 2016-08-19 MED ORDER — SODIUM CHLORIDE 0.9 % WEIGHT BASED INFUSION: 1.0000 mL/kg/h | INTRAVENOUS | Status: DC

## 2016-08-19 MED ORDER — FENTANYL CITRATE (PF) 100 MCG/2ML IJ SOLN
INTRAMUSCULAR | Status: AC
  Filled 2016-08-19: qty 2

## 2016-08-19 MED ORDER — NITROGLYCERIN 5 MG/ML IV SOLN
INTRAVENOUS | Status: AC
  Filled 2016-08-19: qty 10

## 2016-08-19 SURGICAL SUPPLY — 9 items
CATH 5FR JL4 DIAGNOSTIC (CATHETERS) ×2 IMPLANT
CATH 5FR PIGTAIL DIAGNOSTIC (CATHETERS) ×2 IMPLANT
CATH INFINITI JR4 5F (CATHETERS) ×2 IMPLANT
DEVICE CLOSURE MYNXGRIP 5F (Vascular Products) ×2 IMPLANT
KIT MANI 3VAL PERCEP (MISCELLANEOUS) ×2 IMPLANT
NEEDLE PERC 18GX7CM (NEEDLE) ×2 IMPLANT
PACK CARDIAC CATH (CUSTOM PROCEDURE TRAY) ×2 IMPLANT
SHEATH AVANTI 5FR X 11CM (SHEATH) ×2 IMPLANT
WIRE EMERALD 3MM-J .035X150CM (WIRE) ×2 IMPLANT

## 2016-08-19 NOTE — Discharge Instructions (Signed)

## 2016-08-25 DIAGNOSIS — F410 Panic disorder [episodic paroxysmal anxiety] without agoraphobia: Secondary | ICD-10-CM | POA: Diagnosis not present

## 2016-08-25 DIAGNOSIS — F332 Major depressive disorder, recurrent severe without psychotic features: Secondary | ICD-10-CM | POA: Diagnosis not present

## 2016-09-07 ENCOUNTER — Ambulatory Visit (INDEPENDENT_AMBULATORY_CARE_PROVIDER_SITE_OTHER): Payer: PPO | Admitting: Cardiovascular Disease

## 2016-09-07 ENCOUNTER — Encounter: Payer: Self-pay | Admitting: Cardiovascular Disease

## 2016-09-07 ENCOUNTER — Telehealth: Payer: Self-pay | Admitting: Pharmacist

## 2016-09-07 VITALS — BP 118/72 | HR 84 | Ht 62.0 in | Wt 141.0 lb

## 2016-09-07 DIAGNOSIS — Z955 Presence of coronary angioplasty implant and graft: Secondary | ICD-10-CM

## 2016-09-07 DIAGNOSIS — E7800 Pure hypercholesterolemia, unspecified: Secondary | ICD-10-CM

## 2016-09-07 DIAGNOSIS — I10 Essential (primary) hypertension: Secondary | ICD-10-CM

## 2016-09-07 DIAGNOSIS — I739 Peripheral vascular disease, unspecified: Secondary | ICD-10-CM

## 2016-09-07 DIAGNOSIS — I2510 Atherosclerotic heart disease of native coronary artery without angina pectoris: Secondary | ICD-10-CM

## 2016-09-07 DIAGNOSIS — I779 Disorder of arteries and arterioles, unspecified: Secondary | ICD-10-CM | POA: Diagnosis not present

## 2016-09-07 DIAGNOSIS — I209 Angina pectoris, unspecified: Secondary | ICD-10-CM

## 2016-09-07 MED ORDER — POTASSIUM CHLORIDE CRYS ER 20 MEQ PO TBCR: 20.0000 meq | EXTENDED_RELEASE_TABLET | Freq: Three times a day (TID) | ORAL | 3 refills | Status: DC

## 2016-09-07 NOTE — Progress Notes (Signed)
Cardiology Office Note  Date:  09/07/2016   ID:  Valorie, Flikkema [birth year], MRN [id number]  PCP:  Leonel Ramsay, MD   Chief Complaint  Patient presents with  . Coronary Artery Disease  . Hypertension    HPI:  Ms. Marx is a 60 year old woman with long history of smoking who stopped in May 2014, coronary artery disease with non-ST elevation in may 2015 with stent placed to her distal RCA, stroke October 2014 evaluated at [facility]. She presents for routine followup of her coronary artery disease. Previous anginal symptoms included back pain, chest pain like an elephant sitting on her chest  Recent cardiac catheterization performed for anginal symptoms Cath 08/19/2016:  Left anterior descending (LAD):30% proximal LAD disease, 40% mid LAD disease. 70% ostial and proximal disease of the D2 and D3 vessel Right coronary artery (RCA):40 to 50% ostial disease, 30% proximal disease.  Left ventriculography: Left ventricular systolic function is normal, LVEF is estimated at 55-65%, there is no significant mitral regurgitation , no significant aortic valve stenosis  Conclusions:  Stable mild to moderate LAD disease Moderate to severe ostial and proximal diagonal disease (unchanged or improved from previous study) Mild to Moderate ostial RCA disease, mild proximal RCA disease Normal EF No intervention performed   she was noted to have some coronary spasm that resolved with nitroglycerin  Since the procedure, she feels well with no further chest pain symptoms She is concerned about needing left knee total replacement surgery Reports having occasional leg swelling, takes HCTZ as needed Sometimes has pitting edema Takes 2 potassium daily, most recent potassium level III.6 up from 3.0  Lab work reviewed with her, cholesterol typically running 190 even on high-dose Lipitor, and Zetia  EKG on today's visit shows normal sinus rhythm with rate 80 bpm, nonspecific T wave  abnormality  Other past medical history She has severe ostio arthritis of the knee, bone-on-bone Tolerating her cholesterol medication,  on Lipitor 80 mg daily  Review of lab work from April 2016 shows total cholesterol 156, LDL 69   hospitalization 12/31/2013 where she presented with angina. He had a catheterization with distal RCA stent placed.  Also started on ranexa, continued on isosorbide.   Previous Cardiac catheterization report 01/01/2014 details 95% distal RCA lesion with stent placed. Also had 40% mid LAD, 30% distal LAD, 80% D1 disease, 60% D2 disease, 50% ostial RCA disease, 50% proximal RCA disease, 60% mid RCA disease stent placed was a xience  EX 2.5 millimeter stent  Cardiac catheterization in 03/16/2013 details occluded mid RCA with collaterals from distal LAD also 75% stenosis diagonal #1 near the ostium Ejection fraction at that time was 46% notes also detailed 40% mid LAD, 30% distal LAD, 50% D2 disease, 30% proximal circumflex, 75% proximal RCA disease  Echocardiogram 03/16/2013 with ejection fraction 30-35%, moderate to severe mitral valve regurgitation, moderate tricuspid valve regurgitation .  echocardiogram October 2014 following her stroke showed relatively normal ejection fraction estimated at least greater than 55% with no significant valvular regurgitation  PMH:   has a past medical history of Anxiety; CAD (coronary artery disease); Carotid disease, bilateral ([place]); CVA (cerebral vascular accident) ([place]); Decreased libido; Depression; History of recurrent UTIs; Hypercholesteremia; Hyperlipemia; Hypertension; Insomnia; Ischemic cardiomyopathy; Menopause; Migraines; Myocardial infarction; Right lower quadrant pain; Syncope and collapse; Tobacco abuse; and Vaginal atrophy.  PSH:    Past Surgical History:  Procedure Laterality Date  . CARDIAC CATHETERIZATION  01/01/2014  . CARDIAC CATHETERIZATION  03/16/2013  . CARDIAC CATHETERIZATION  12/2013  . CARDIAC  CATHETERIZATION N/A 08/19/2016   Procedure: Left Heart Cath and Coronary Angiography;  Surgeon: Minna Merritts, MD;  Location: [facility];  Service: Cardiovascular;  Laterality: N/A;  . CESAREAN SECTION    . CESAREAN SECTION WITH BILATERAL TUBAL LIGATION    . CORONARY ANGIOPLASTY WITH STENT PLACEMENT  01/01/2014   95% lesion with a drug eluting stent to the distal RCA.  Marland Kitchen ENDOMETRIAL ABLATION    . KNEE ARTHROSCOPY  11/27/2006   left knee   . OOPHORECTOMY    . TUBAL LIGATION      Current Outpatient Prescriptions  Medication Sig Dispense Refill  . acetaminophen (TYLENOL) 500 MG tablet Take 1,000 mg by mouth every 6 (six) hours as needed for mild pain or moderate pain.    Marland Kitchen aspirin 81 MG tablet Take 81 mg by mouth daily.    Marland Kitchen atorvastatin (LIPITOR) 80 MG tablet Take 80 mg by mouth daily.     . Brexpiprazole (REXULTI) 3 MG TABS Take by mouth daily.    . clopidogrel (PLAVIX) 75 MG tablet TAKE 1 TABLET BY MOUTH DAILY 90 tablet 4  . DULoxetine (CYMBALTA) 30 MG capsule Take 90 mg by mouth daily.  3  . ezetimibe (ZETIA) 10 MG tablet Take 1 tablet (10 mg total) by mouth daily. 30 tablet 11  . furosemide (LASIX) 20 MG tablet Take 20 mg by mouth daily as needed for fluid.     . hydrOXYzine (ATARAX/VISTARIL) 25 MG tablet TAKE 1/2 TO 1 TABLET BY MOUTH TWICE A DAY  3  . isosorbide mononitrate (IMDUR) 60 MG 24 hr tablet Take 1 tablet (60 mg total) by mouth daily. 30 tablet 6  . metoprolol succinate (TOPROL-XL) 25 MG 24 hr tablet Take 25 mg by mouth every morning.     . nitrofurantoin, macrocrystal-monohydrate, (MACROBID) 100 MG capsule Take 1 capsule (100 mg total) by mouth at bedtime. 90 capsule 4  . nitroGLYCERIN (NITROSTAT) 0.4 MG SL tablet Place 1 tablet (0.4 mg total) under the tongue every 5 (five) minutes as needed for chest pain. 25 tablet 6  . pantoprazole (PROTONIX) 40 MG tablet Take 40 mg by mouth every 12 (twelve) hours.    . potassium chloride SA (K-DUR,KLOR-CON) 20 MEQ tablet  Take 1 tablet (20 mEq total) by mouth 3 (three) times daily. 270 tablet 3  . ranolazine (RANEXA) 500 MG 12 hr tablet Take 1 tablet (500 mg total) by mouth 2 (two) times daily. 60 tablet 6  . sertraline (ZOLOFT) 100 MG tablet Take 200 mg by mouth daily.     Marland Kitchen triamterene-hydrochlorothiazide (DYAZIDE) 37.5-25 MG capsule TAKE 1 TABLET BY MOUTH DAILY  5  . zolpidem (AMBIEN) 5 MG tablet Take 5 mg by mouth at bedtime as needed for sleep.     No current facility-administered medications for this visit.      Allergies:   Codeine sulfate and Penicillins   Social History:  The patient  reports that she quit smoking about 3 years ago. Her smoking use included Cigarettes. She has a 5.00 pack-year smoking history. She has never used smokeless tobacco. She reports that she does not drink alcohol or use drugs.   Family History:   family history includes Breast cancer in her paternal grandmother; Hyperlipidemia in her mother; Hypertension in her mother; Stroke in her mother.    Review of Systems: Review of Systems  Constitutional: Negative.   Respiratory: Negative.   Cardiovascular: Negative.   Gastrointestinal: Negative.  Musculoskeletal: Negative.   Neurological: Negative.   Psychiatric/Behavioral: Negative.   All other systems reviewed and are negative.    PHYSICAL EXAM: VS:  BP 118/72   Pulse 84   Ht 5\' 2"  (1.575 m)   Wt 141 lb (64 kg)   SpO2 95%   BMI 25.79 kg/m  , BMI Body mass index is 25.79 kg/m. GEN: Well nourished, well developed, in no acute distress  HEENT: normal  Neck: no JVD, carotid bruits, or masses Cardiac: RRR; no murmurs, rubs, or gallops,no edema  Respiratory:  clear to auscultation bilaterally, normal work of breathing GI: soft, nontender, nondistended, + BS MS: no deformity or atrophy  Skin: warm and dry, no rash Neuro:  Strength and sensation are intact Psych: euthymic mood, full affect    Recent Labs: 06/19/2016: Hemoglobin 12.1 08/07/2016: BUN 9;  Creatinine, Ser 0.79; Platelets 345; Potassium 3.6; Sodium 142    Lipid Panel Lab Results  Component Value Date   CHOL 156 02/18/2015   HDL 55 02/18/2015   LDLCALC 69 02/18/2015   TRIG 162 (H) 02/18/2015      Wt Readings from Last 3 Encounters:  09/07/16 141 lb (64 kg)  08/19/16 139 lb (63 kg)  08/07/16 139 lb (63 kg)       ASSESSMENT AND PLAN:  CAD in native artery - Plan: EKG 12-Lead Recent cardiac catheterization, stable disease, no intervention needed Acceptable risk for upcoming orthopedic surgery  Essential hypertension - Plan: EKG 12-Lead Blood pressure is well controlled on today's visit. No changes made to the medications.  Pure hypercholesterolemia Long discussion concerning various treatment options for her hyperlipidemia Currently taking high-dose Lipitor and zetia Still not at goal. Recommended she consider taking praluent or repatha to reach goal LDL less than 70  Bilateral carotid artery disease (HCC)  Angina pectoris ([place]) Currently without chest pain symptoms. Stable disease on recent cardiac catheterization. Recommended she take sublingual nitroglycerin for any additional episodes of stable angina  History of coronary artery stent placement  Pre-op cardiovascular Acceptable risk for total knee replacement surgery with Dr. Roland Rack, No further testing needed   Total encounter time more than 15 minutes  Greater than 50% was spent in counseling and coordination of care with the patient  Disposition:   F/U  6 months   Orders Placed This Encounter  Procedures  . EKG 12-Lead     Signed, Esmond Plants, M.D., Ph.D. 09/07/2016  [place], [place]

## 2016-09-07 NOTE — Patient Instructions (Addendum)
Medication Instructions:   Please take lasix/furosemide as needed for pitting leg edema Take with potassium  Labwork:  No new labs needed  Testing/Procedures:  No further testing at this time  We will request clearance for repatha/praluent   Follow-Up: It was a pleasure seeing you in the office today. Please call us if you have new issues that need to be addressed before your next appt.  [phone]  Your physician wants you to follow-up in: 12 months.  You will receive a reminder letter in the mail two months in advance. If you don't receive a letter, please call our office to schedule the follow-up appointment.  If you need a refill on your cardiac medications before your next appointment, please call your pharmacy.

## 2016-09-07 NOTE — Telephone Encounter (Signed)
Received a request from [facility] office to pursue PCSK9i for patient. She currently takes atorvastatin 80mg  daily and ezetimibe 10mg  daily for her cholesterol. Most recent lipid panel was drawn 06/30/16: TC 198, TG 195, HDL 43.8, LDL 115. LDL is above goal 70mg /dL given history of ASCVD. Pt has a history of CAD s/p NSTEMI in May 2015 with stent placed to her distal RCA, stroke in October 2014, and cardiac cath 08/19/16 which showed 30% proximal LAD stenosis, 40% mid LAD stenosis, 70% ostial and proximal disease of D2 and D3 vessels, and 40-50% ostial RCA disease, 30% proximal disease. Since patient is already taking high intensity statin therapy at the highest dose in addition to Zetia, pt needs PCSK9i to bring her LDL to goal. Will submit prior authorization for Praluent.

## 2016-09-08 ENCOUNTER — Telehealth: Payer: Self-pay

## 2016-09-08 NOTE — Telephone Encounter (Signed)
Praluent 75 mg/mL is approved from 09-07-2016 to 11-02-2016.  Patient advised.

## 2016-09-10 ENCOUNTER — Telehealth: Payer: Self-pay | Admitting: Cardiovascular Disease

## 2016-09-10 NOTE — Telephone Encounter (Signed)
Received cardiac clearance request for pt to proceed w/ left TKR on 10/06/16 w/ Dr. Roland Rack. Per Dr. Rockey Situ, cath on 08/19/16 was stable and pt is cleared to proceed @ low risk. Faxed signed form to [place] @ [phone].

## 2016-09-23 ENCOUNTER — Ambulatory Visit
Admission: RE | Admit: 2016-09-23 | Discharge: 2016-09-23 | Disposition: A | Discharge status: Home / Self Care | Payer: PPO | Source: Ambulatory Visit | Attending: Surgery | Admitting: Surgery

## 2016-09-23 ENCOUNTER — Encounter
Admission: RE | Admit: 2016-09-23 | Discharge: 2016-09-23 | Disposition: A | Discharge status: Home / Self Care | Payer: PPO | Source: Ambulatory Visit | Attending: Surgery | Admitting: Surgery

## 2016-09-23 DIAGNOSIS — Z01812 Encounter for preprocedural laboratory examination: Secondary | ICD-10-CM | POA: Insufficient documentation

## 2016-09-23 DIAGNOSIS — Z8673 Personal history of transient ischemic attack (TIA), and cerebral infarction without residual deficits: Secondary | ICD-10-CM | POA: Insufficient documentation

## 2016-09-23 DIAGNOSIS — I2510 Atherosclerotic heart disease of native coronary artery without angina pectoris: Secondary | ICD-10-CM | POA: Diagnosis not present

## 2016-09-23 DIAGNOSIS — I639 Cerebral infarction, unspecified: Secondary | ICD-10-CM

## 2016-09-23 DIAGNOSIS — Z01810 Encounter for preprocedural cardiovascular examination: Secondary | ICD-10-CM | POA: Diagnosis not present

## 2016-09-23 DIAGNOSIS — I10 Essential (primary) hypertension: Secondary | ICD-10-CM | POA: Diagnosis not present

## 2016-09-23 DIAGNOSIS — Z01818 Encounter for other preprocedural examination: Secondary | ICD-10-CM

## 2016-09-23 HISTORY — DX: Gastro-esophageal reflux disease without esophagitis: K21.9

## 2016-09-23 HISTORY — DX: Unspecified osteoarthritis, unspecified site: M19.90

## 2016-09-23 HISTORY — DX: Other specified postprocedural states: R11.2

## 2016-09-23 HISTORY — DX: Adverse effect of unspecified anesthetic, initial encounter: T41.45XA

## 2016-09-23 HISTORY — DX: Other complications of anesthesia, initial encounter: T88.59XA

## 2016-09-23 HISTORY — DX: Other specified postprocedural states: Z98.890

## 2016-09-23 LAB — URINALYSIS COMPLETE WITH MICROSCOPIC (ARMC ONLY)
Bilirubin Urine: NEGATIVE
Glucose, UA: NEGATIVE mg/dL
Hgb urine dipstick: NEGATIVE
KETONES UR: NEGATIVE mg/dL
Leukocytes, UA: NEGATIVE
Nitrite: NEGATIVE
PH: 5 (ref 5.0–8.0)
PROTEIN: NEGATIVE mg/dL
Specific Gravity, Urine: 1.017 (ref 1.005–1.030)

## 2016-09-23 LAB — BASIC METABOLIC PANEL
ANION GAP: 6 (ref 5–15)
BUN: 15 mg/dL (ref 6–20)
CALCIUM: 9.6 mg/dL (ref 8.9–10.3)
CO2: 30 mmol/L (ref 22–32)
Chloride: 103 mmol/L (ref 101–111)
Creatinine, Ser: 0.75 mg/dL (ref 0.44–1.00)
Glucose, Bld: 102 mg/dL — ABNORMAL HIGH (ref 65–99)
Potassium: 3.1 mmol/L — ABNORMAL LOW (ref 3.5–5.1)
Sodium: 139 mmol/L (ref 135–145)

## 2016-09-23 LAB — CBC
HCT: 37.7 % (ref 35.0–47.0)
Hemoglobin: 13.2 g/dL (ref 12.0–16.0)
MCH: 32.1 pg (ref 26.0–34.0)
MCHC: 34.9 g/dL (ref 32.0–36.0)
MCV: 92 fL (ref 80.0–100.0)
PLATELETS: 278 10*3/uL (ref 150–440)
RBC: 4.09 MIL/uL (ref 3.80–5.20)
RDW: 12.9 % (ref 11.5–14.5)
WBC: 7 10*3/uL (ref 3.6–11.0)

## 2016-09-23 LAB — SURGICAL PCR SCREEN
MRSA, PCR: NEGATIVE
STAPHYLOCOCCUS AUREUS: NEGATIVE

## 2016-09-23 LAB — TYPE AND SCREEN
ABO/RH(D): A NEG
Antibody Screen: NEGATIVE

## 2016-09-23 LAB — PROTIME-INR
INR: 0.95
Prothrombin Time: 12.7 seconds (ref 11.4–15.2)

## 2016-09-23 NOTE — Patient Instructions (Signed)
  Your procedure is scheduled on: Tuesday Nov. 28, 2017. Report to Same Day Surgery. To find out your arrival time please call [phone] between 1PM - 3PM on Monday Nov.27, 2017.  Remember: Instructions that are not followed completely may result in serious medical risk, up to and including death, or upon the discretion of your surgeon and anesthesiologist your surgery may need to be rescheduled.    _x___ 1. Do not eat food or drink liquids after midnight. No gum chewing or hard candies.     _x___ 2. No Alcohol for 24 hours before or after surgery.   ____ 3. Bring all medications with you on the day of surgery if instructed.    __x__ 4. Notify your doctor if there is any change in your medical condition     (cold, fever, infections).    _____ 5. No smoking 24 hours prior to surgery.     Do not wear jewelry, make-up, hairpins, clips or nail polish.  Do not wear lotions, powders, or perfumes.   Do not shave 48 hours prior to surgery. Men may shave face and neck.  Do not bring valuables to the hospital.    [facility] is not responsible for any belongings or valuables.               Contacts, dentures or bridgework may not be worn into surgery.  Leave your suitcase in the car. After surgery it may be brought to your room.  For patients admitted to the hospital, discharge time is determined by your treatment team.   Patients discharged the day of surgery will not be allowed to drive home.    Please read over the following fact sheets that you were given:   [facility] Preparing for Surgery  _x___ Take these medicines the morning of surgery with A SIP OF WATER:    1. DULoxetine (CYMBALTA)  2. metoprolol succinate (TOPROL-XL)  3. pantoprazole (PROTONIX)  4. sertraline (ZOLOFT)    ____ Fleet Enema (as directed)   _x___ Use CHG Soap as directed on instruction sheet  ____ Use inhalers on the day of surgery and bring to hospital day of surgery  ____ Stop metformin 2 days  prior to surgery    ____ Take 1/2 of usual insulin dose the night before surgery and none on the morning of surgery.   _x___ Ask if and when to stop aspirin and or Plavix prior to surgery at [facility] appt on 09/30/16.  _x___ Stop Anti-inflammatories such as Advil, Aleve, Ibuprofen, Motrin, Naproxen, Naprosyn, Goodies powders or aspirin products. OK to take Tylenol.   ____ Stop supplements until after surgery.    ____ Bring C-Pap to the hospital.

## 2016-09-24 NOTE — Pre-Procedure Instructions (Signed)
Lab results from yesterdays PAT visit faxed to Dr. Nicholaus Bloom office, potassium of 3.1 with request for increase in potassium supplement.  Recheck potassium via ISTAT am of surgery order entered in Epic.

## 2016-09-29 ENCOUNTER — Telehealth: Payer: Self-pay | Admitting: Pharmacist

## 2016-09-29 MED ORDER — ALIROCUMAB 75 MG/ML ~~LOC~~ SOPN: 1.0000 "pen " | PEN_INJECTOR | SUBCUTANEOUS | 11 refills | Status: DC

## 2016-09-29 NOTE — Telephone Encounter (Signed)
Prior authorization for Praluent was approved 09/07/2016-11/08/2016. We were never notified of the approval so now patient only has 5 week of approval, which is not long enough for her to start receiving Praluent and have the necessary follow up lab work to start on reauthorization process. Filed a complaint with [employer] and requested an extended approval. Will send rx to specialty pharmacy and follow up with patient with the cost of Praluent.

## 2016-09-30 ENCOUNTER — Telehealth: Payer: Self-pay | Admitting: Cardiovascular Disease

## 2016-09-30 NOTE — Telephone Encounter (Signed)
Pt would like for you to call regarding her Praluent. She received them in the mail and needs some instructions.

## 2016-09-30 NOTE — Telephone Encounter (Signed)
Left message for pt to call back  °

## 2016-10-05 MED ORDER — CLINDAMYCIN PHOSPHATE 900 MG/50ML IV SOLN
900.0000 mg | Freq: Once | INTRAVENOUS | Status: AC
  Administered 2016-10-06: 900 mg via INTRAVENOUS

## 2016-10-06 ENCOUNTER — Inpatient Hospital Stay
Admission: RE | Admit: 2016-10-06 | Discharge: 2016-10-10 | DRG: 470 | Disposition: A | Discharge status: Skilled Nursing Facility | Payer: PPO | Source: Ambulatory Visit | Attending: Surgery | Admitting: Surgery

## 2016-10-06 ENCOUNTER — Inpatient Hospital Stay: Payer: PPO

## 2016-10-06 ENCOUNTER — Inpatient Hospital Stay: Payer: PPO | Admitting: Anesthesiology

## 2016-10-06 ENCOUNTER — Encounter
Admission: RE | Disposition: A | Discharge status: Skilled Nursing Facility | Payer: Self-pay | Source: Ambulatory Visit | Attending: Surgery

## 2016-10-06 ENCOUNTER — Encounter: Payer: Self-pay | Admitting: *Deleted

## 2016-10-06 DIAGNOSIS — M25662 Stiffness of left knee, not elsewhere classified: Secondary | ICD-10-CM

## 2016-10-06 DIAGNOSIS — Z880 Allergy status to penicillin: Secondary | ICD-10-CM | POA: Diagnosis not present

## 2016-10-06 DIAGNOSIS — E7800 Pure hypercholesterolemia, unspecified: Secondary | ICD-10-CM | POA: Diagnosis not present

## 2016-10-06 DIAGNOSIS — Z7902 Long term (current) use of antithrombotics/antiplatelets: Secondary | ICD-10-CM | POA: Diagnosis not present

## 2016-10-06 DIAGNOSIS — I10 Essential (primary) hypertension: Secondary | ICD-10-CM | POA: Diagnosis not present

## 2016-10-06 DIAGNOSIS — F329 Major depressive disorder, single episode, unspecified: Secondary | ICD-10-CM | POA: Diagnosis not present

## 2016-10-06 DIAGNOSIS — Z8673 Personal history of transient ischemic attack (TIA), and cerebral infarction without residual deficits: Secondary | ICD-10-CM

## 2016-10-06 DIAGNOSIS — R262 Difficulty in walking, not elsewhere classified: Secondary | ICD-10-CM

## 2016-10-06 DIAGNOSIS — I255 Ischemic cardiomyopathy: Secondary | ICD-10-CM | POA: Diagnosis present

## 2016-10-06 DIAGNOSIS — Z79899 Other long term (current) drug therapy: Secondary | ICD-10-CM | POA: Diagnosis not present

## 2016-10-06 DIAGNOSIS — M1712 Unilateral primary osteoarthritis, left knee: Secondary | ICD-10-CM | POA: Diagnosis not present

## 2016-10-06 DIAGNOSIS — F419 Anxiety disorder, unspecified: Secondary | ICD-10-CM | POA: Diagnosis not present

## 2016-10-06 DIAGNOSIS — Z96652 Presence of left artificial knee joint: Secondary | ICD-10-CM | POA: Diagnosis not present

## 2016-10-06 DIAGNOSIS — Z955 Presence of coronary angioplasty implant and graft: Secondary | ICD-10-CM

## 2016-10-06 DIAGNOSIS — K219 Gastro-esophageal reflux disease without esophagitis: Secondary | ICD-10-CM | POA: Diagnosis not present

## 2016-10-06 DIAGNOSIS — I739 Peripheral vascular disease, unspecified: Secondary | ICD-10-CM | POA: Diagnosis present

## 2016-10-06 DIAGNOSIS — Z7982 Long term (current) use of aspirin: Secondary | ICD-10-CM | POA: Diagnosis not present

## 2016-10-06 DIAGNOSIS — Z885 Allergy status to narcotic agent status: Secondary | ICD-10-CM

## 2016-10-06 DIAGNOSIS — I2510 Atherosclerotic heart disease of native coronary artery without angina pectoris: Secondary | ICD-10-CM | POA: Diagnosis not present

## 2016-10-06 DIAGNOSIS — M6281 Muscle weakness (generalized): Secondary | ICD-10-CM

## 2016-10-06 DIAGNOSIS — E785 Hyperlipidemia, unspecified: Secondary | ICD-10-CM | POA: Diagnosis not present

## 2016-10-06 DIAGNOSIS — R509 Fever, unspecified: Secondary | ICD-10-CM

## 2016-10-06 DIAGNOSIS — I252 Old myocardial infarction: Secondary | ICD-10-CM | POA: Diagnosis not present

## 2016-10-06 DIAGNOSIS — Z471 Aftercare following joint replacement surgery: Secondary | ICD-10-CM | POA: Diagnosis not present

## 2016-10-06 HISTORY — PX: TOTAL KNEE ARTHROPLASTY: SHX125

## 2016-10-06 LAB — POCT I-STAT 4, (NA,K, GLUC, HGB,HCT)
Glucose, Bld: 94 mg/dL (ref 65–99)
HEMATOCRIT: 38 % (ref 36.0–46.0)
HEMOGLOBIN: 12.9 g/dL (ref 12.0–15.0)
Potassium: 3.4 mmol/L — ABNORMAL LOW (ref 3.5–5.1)
SODIUM: 140 mmol/L (ref 135–145)

## 2016-10-06 LAB — ABO/RH: ABO/RH(D): A NEG

## 2016-10-06 SURGERY — ARTHROPLASTY, KNEE, TOTAL
Anesthesia: Spinal | Site: Knee | Laterality: Left | Wound class: Clean

## 2016-10-06 MED ORDER — ACETAMINOPHEN 10 MG/ML IV SOLN
INTRAVENOUS | Status: DC | PRN
  Administered 2016-10-06: 1000 mg via INTRAVENOUS

## 2016-10-06 MED ORDER — TRIAMTERENE-HCTZ 37.5-25 MG PO TABS
1.0000 | ORAL_TABLET | Freq: Every day | ORAL | Status: DC
  Administered 2016-10-06 – 2016-10-09 (×4): 1 via ORAL
  Filled 2016-10-06 (×5): qty 1

## 2016-10-06 MED ORDER — CLINDAMYCIN PHOSPHATE 900 MG/50ML IV SOLN
INTRAVENOUS | Status: AC
  Filled 2016-10-06: qty 50

## 2016-10-06 MED ORDER — ISOSORBIDE MONONITRATE ER 60 MG PO TB24
60.0000 mg | ORAL_TABLET | Freq: Every day | ORAL | Status: DC
  Administered 2016-10-07 – 2016-10-10 (×4): 60 mg via ORAL
  Filled 2016-10-06 (×4): qty 1

## 2016-10-06 MED ORDER — NITROGLYCERIN 0.4 MG SL SUBL: 0.4000 mg | SUBLINGUAL_TABLET | SUBLINGUAL | Status: DC | PRN

## 2016-10-06 MED ORDER — VITAMIN D 1000 UNITS PO TABS
1000.0000 [IU] | ORAL_TABLET | Freq: Every day | ORAL | Status: DC
Start: 2016-10-06 — End: 2016-10-10
  Administered 2016-10-06 – 2016-10-09 (×4): 1000 [IU] via ORAL
  Filled 2016-10-06 (×5): qty 1

## 2016-10-06 MED ORDER — MIDAZOLAM HCL 5 MG/5ML IJ SOLN
INTRAMUSCULAR | Status: DC | PRN
  Administered 2016-10-06: 2 mg via INTRAVENOUS

## 2016-10-06 MED ORDER — DULOXETINE HCL 60 MG PO CPEP
90.0000 mg | ORAL_CAPSULE | Freq: Every day | ORAL | Status: DC
  Administered 2016-10-07 – 2016-10-10 (×4): 90 mg via ORAL
  Filled 2016-10-06 (×4): qty 1

## 2016-10-06 MED ORDER — TRANEXAMIC ACID 1000 MG/10ML IV SOLN
INTRAVENOUS | Status: AC
Start: 2016-10-06 — End: 2016-10-06
  Filled 2016-10-06: qty 10

## 2016-10-06 MED ORDER — PANTOPRAZOLE SODIUM 40 MG PO TBEC
40.0000 mg | DELAYED_RELEASE_TABLET | Freq: Every day | ORAL | Status: DC
  Administered 2016-10-06 – 2016-10-09 (×4): 40 mg via ORAL
  Filled 2016-10-06 (×4): qty 1

## 2016-10-06 MED ORDER — DOCUSATE SODIUM 100 MG PO CAPS
100.0000 mg | ORAL_CAPSULE | Freq: Two times a day (BID) | ORAL | Status: DC
  Administered 2016-10-06 – 2016-10-10 (×9): 100 mg via ORAL
  Filled 2016-10-06 (×9): qty 1

## 2016-10-06 MED ORDER — EZETIMIBE 10 MG PO TABS
10.0000 mg | ORAL_TABLET | Freq: Every day | ORAL | Status: DC
  Administered 2016-10-07 – 2016-10-09 (×3): 10 mg via ORAL
  Filled 2016-10-06 (×5): qty 1

## 2016-10-06 MED ORDER — LACTATED RINGERS IV SOLN
INTRAVENOUS | Status: DC
  Administered 2016-10-06 (×2): via INTRAVENOUS

## 2016-10-06 MED ORDER — FENTANYL CITRATE (PF) 100 MCG/2ML IJ SOLN: 25.0000 ug | INTRAMUSCULAR | Status: DC | PRN

## 2016-10-06 MED ORDER — NEOMYCIN-POLYMYXIN B GU 40-200000 IR SOLN
Status: AC
  Filled 2016-10-06: qty 20

## 2016-10-06 MED ORDER — KETAMINE HCL 50 MG/ML IJ SOLN
INTRAMUSCULAR | Status: DC | PRN
  Administered 2016-10-06 (×2): 1.25 mg via INTRAMUSCULAR

## 2016-10-06 MED ORDER — FENTANYL CITRATE (PF) 100 MCG/2ML IJ SOLN
INTRAMUSCULAR | Status: DC | PRN
  Administered 2016-10-06 (×2): 50 ug via INTRAVENOUS

## 2016-10-06 MED ORDER — ATORVASTATIN CALCIUM 20 MG PO TABS
80.0000 mg | ORAL_TABLET | Freq: Every day | ORAL | Status: DC
  Administered 2016-10-06 – 2016-10-09 (×4): 80 mg via ORAL
  Filled 2016-10-06 (×5): qty 4

## 2016-10-06 MED ORDER — SODIUM CHLORIDE 0.9 % IJ SOLN
INTRAMUSCULAR | Status: AC
  Filled 2016-10-06: qty 50

## 2016-10-06 MED ORDER — PROPOFOL 10 MG/ML IV BOLUS
INTRAVENOUS | Status: DC | PRN
  Administered 2016-10-06 (×2): 12.5 mg via INTRAVENOUS

## 2016-10-06 MED ORDER — ENOXAPARIN SODIUM 40 MG/0.4ML ~~LOC~~ SOLN
40.0000 mg | SUBCUTANEOUS | Status: DC
  Administered 2016-10-07 – 2016-10-10 (×4): 40 mg via SUBCUTANEOUS
  Filled 2016-10-06 (×4): qty 0.4

## 2016-10-06 MED ORDER — ACETAMINOPHEN 325 MG PO TABS
650.0000 mg | ORAL_TABLET | Freq: Four times a day (QID) | ORAL | Status: DC | PRN
  Administered 2016-10-08 – 2016-10-09 (×3): 650 mg via ORAL
  Filled 2016-10-06 (×3): qty 2

## 2016-10-06 MED ORDER — KCL IN DEXTROSE-NACL 20-5-0.9 MEQ/L-%-% IV SOLN
INTRAVENOUS | Status: DC
  Administered 2016-10-06: 16:00:00 via INTRAVENOUS
  Filled 2016-10-06 (×12): qty 1000

## 2016-10-06 MED ORDER — BUPIVACAINE-EPINEPHRINE (PF) 0.5% -1:200000 IJ SOLN
INTRAMUSCULAR | Status: DC | PRN
  Administered 2016-10-06: 30 mL

## 2016-10-06 MED ORDER — SODIUM CHLORIDE 0.9 % IV SOLN
INTRAVENOUS | Status: DC | PRN
  Administered 2016-10-06: 15 ug/min via INTRAVENOUS

## 2016-10-06 MED ORDER — SERTRALINE HCL 100 MG PO TABS
200.0000 mg | ORAL_TABLET | Freq: Every day | ORAL | Status: DC
  Administered 2016-10-07 – 2016-10-10 (×4): 200 mg via ORAL
  Filled 2016-10-06 (×4): qty 2

## 2016-10-06 MED ORDER — SODIUM CHLORIDE 0.9 % IV SOLN
INTRAVENOUS | Status: DC | PRN
  Administered 2016-10-06: 60 mL

## 2016-10-06 MED ORDER — ZOLPIDEM TARTRATE 5 MG PO TABS: 5.0000 mg | ORAL_TABLET | Freq: Every evening | ORAL | Status: DC | PRN

## 2016-10-06 MED ORDER — ASPIRIN 81 MG PO CHEW
81.0000 mg | CHEWABLE_TABLET | Freq: Every day | ORAL | Status: DC
  Administered 2016-10-06 – 2016-10-09 (×4): 81 mg via ORAL
  Filled 2016-10-06 (×5): qty 1

## 2016-10-06 MED ORDER — TRANEXAMIC ACID 1000 MG/10ML IV SOLN
INTRAVENOUS | Status: DC | PRN
  Administered 2016-10-06: 1000 mg via INTRAVENOUS

## 2016-10-06 MED ORDER — ACETAMINOPHEN 500 MG PO TABS
1000.0000 mg | ORAL_TABLET | Freq: Four times a day (QID) | ORAL | Status: AC
  Administered 2016-10-06 – 2016-10-07 (×3): 1000 mg via ORAL
  Filled 2016-10-06 (×3): qty 2

## 2016-10-06 MED ORDER — ACETAMINOPHEN 10 MG/ML IV SOLN
INTRAVENOUS | Status: AC
  Filled 2016-10-06: qty 100

## 2016-10-06 MED ORDER — ONDANSETRON HCL 4 MG/2ML IJ SOLN
4.0000 mg | Freq: Four times a day (QID) | INTRAMUSCULAR | Status: DC | PRN
  Administered 2016-10-08 (×2): 4 mg via INTRAVENOUS
  Filled 2016-10-06 (×2): qty 2

## 2016-10-06 MED ORDER — KETOROLAC TROMETHAMINE 15 MG/ML IJ SOLN
15.0000 mg | Freq: Four times a day (QID) | INTRAMUSCULAR | Status: AC
  Administered 2016-10-06 – 2016-10-07 (×3): 15 mg via INTRAVENOUS
  Filled 2016-10-06 (×3): qty 1

## 2016-10-06 MED ORDER — HYDROMORPHONE HCL 1 MG/ML IJ SOLN
1.0000 mg | INTRAMUSCULAR | Status: DC | PRN
Start: 2016-10-06 — End: 2016-10-10
  Administered 2016-10-06: 1 mg via INTRAVENOUS
  Administered 2016-10-07: 2 mg via INTRAVENOUS
  Administered 2016-10-08: 1 mg via INTRAVENOUS
  Filled 2016-10-06 (×2): qty 2
  Filled 2016-10-06: qty 1

## 2016-10-06 MED ORDER — SODIUM CHLORIDE 0.9 % IV SOLN
INTRAVENOUS | Status: DC | PRN
  Administered 2016-10-06: 7.5 ug/kg/min via INTRAVENOUS

## 2016-10-06 MED ORDER — FUROSEMIDE 20 MG PO TABS: 20.0000 mg | ORAL_TABLET | Freq: Every day | ORAL | Status: DC | PRN

## 2016-10-06 MED ORDER — PROPOFOL 500 MG/50ML IV EMUL
INTRAVENOUS | Status: DC | PRN
  Administered 2016-10-06: 75 ug/kg/min via INTRAVENOUS

## 2016-10-06 MED ORDER — METOCLOPRAMIDE HCL 10 MG PO TABS: 5.0000 mg | ORAL_TABLET | Freq: Three times a day (TID) | ORAL | Status: DC | PRN

## 2016-10-06 MED ORDER — CLINDAMYCIN PHOSPHATE 900 MG/50ML IV SOLN
900.0000 mg | Freq: Four times a day (QID) | INTRAVENOUS | Status: AC
  Administered 2016-10-06 (×3): 900 mg via INTRAVENOUS
  Filled 2016-10-06 (×3): qty 50

## 2016-10-06 MED ORDER — MAGNESIUM HYDROXIDE 400 MG/5ML PO SUSP
30.0000 mL | Freq: Every day | ORAL | Status: DC | PRN
  Administered 2016-10-08: 30 mL via ORAL
  Filled 2016-10-06: qty 30

## 2016-10-06 MED ORDER — ACETAMINOPHEN 650 MG RE SUPP
650.0000 mg | Freq: Four times a day (QID) | RECTAL | Status: DC | PRN
Start: 2016-10-06 — End: 2016-10-10

## 2016-10-06 MED ORDER — BUPIVACAINE LIPOSOME 1.3 % IJ SUSP
INTRAMUSCULAR | Status: AC
  Filled 2016-10-06: qty 20

## 2016-10-06 MED ORDER — METOPROLOL SUCCINATE ER 25 MG PO TB24
25.0000 mg | ORAL_TABLET | Freq: Every morning | ORAL | Status: DC
  Administered 2016-10-07 – 2016-10-10 (×4): 25 mg via ORAL
  Filled 2016-10-06 (×5): qty 1

## 2016-10-06 MED ORDER — FLEET ENEMA 7-19 GM/118ML RE ENEM: 1.0000 | ENEMA | Freq: Once | RECTAL | Status: DC | PRN

## 2016-10-06 MED ORDER — NEOMYCIN-POLYMYXIN B GU 40-200000 IR SOLN
Status: DC | PRN
  Administered 2016-10-06: 16 mL

## 2016-10-06 MED ORDER — PROMETHAZINE HCL 25 MG/ML IJ SOLN: 6.2500 mg | INTRAMUSCULAR | Status: DC | PRN

## 2016-10-06 MED ORDER — NITROFURANTOIN MONOHYD MACRO 100 MG PO CAPS
100.0000 mg | ORAL_CAPSULE | Freq: Every day | ORAL | Status: DC
  Administered 2016-10-06 – 2016-10-09 (×4): 100 mg via ORAL
  Filled 2016-10-06 (×4): qty 1

## 2016-10-06 MED ORDER — ONDANSETRON HCL 4 MG PO TABS
4.0000 mg | ORAL_TABLET | Freq: Four times a day (QID) | ORAL | Status: DC | PRN
  Administered 2016-10-07: 4 mg via ORAL
  Filled 2016-10-06: qty 1

## 2016-10-06 MED ORDER — BUPIVACAINE-EPINEPHRINE (PF) 0.5% -1:200000 IJ SOLN
INTRAMUSCULAR | Status: AC
  Filled 2016-10-06: qty 30

## 2016-10-06 MED ORDER — DIPHENHYDRAMINE HCL 12.5 MG/5ML PO ELIX: 12.5000 mg | ORAL_SOLUTION | ORAL | Status: DC | PRN

## 2016-10-06 MED ORDER — KETOROLAC TROMETHAMINE 15 MG/ML IJ SOLN
30.0000 mg | Freq: Once | INTRAMUSCULAR | Status: AC
  Administered 2016-10-06: 30 mg via INTRAVENOUS
  Filled 2016-10-06: qty 2

## 2016-10-06 MED ORDER — DEXAMETHASONE SODIUM PHOSPHATE 10 MG/ML IJ SOLN
INTRAMUSCULAR | Status: DC | PRN
  Administered 2016-10-06: 8 mg via INTRAVENOUS

## 2016-10-06 MED ORDER — HYDROXYZINE HCL 25 MG PO TABS
25.0000 mg | ORAL_TABLET | Freq: Two times a day (BID) | ORAL | Status: DC | PRN
  Filled 2016-10-06: qty 1

## 2016-10-06 MED ORDER — CLOPIDOGREL BISULFATE 75 MG PO TABS
75.0000 mg | ORAL_TABLET | Freq: Every day | ORAL | Status: DC
  Administered 2016-10-06 – 2016-10-09 (×4): 75 mg via ORAL
  Filled 2016-10-06 (×4): qty 1

## 2016-10-06 MED ORDER — OXYCODONE HCL 5 MG PO TABS
5.0000 mg | ORAL_TABLET | ORAL | Status: DC | PRN
  Administered 2016-10-06 – 2016-10-09 (×13): 10 mg via ORAL
  Filled 2016-10-06 (×13): qty 2

## 2016-10-06 MED ORDER — POTASSIUM CHLORIDE CRYS ER 20 MEQ PO TBCR
20.0000 meq | EXTENDED_RELEASE_TABLET | Freq: Three times a day (TID) | ORAL | Status: DC
  Administered 2016-10-06 – 2016-10-10 (×11): 20 meq via ORAL
  Filled 2016-10-06 (×11): qty 1

## 2016-10-06 MED ORDER — ALIROCUMAB 75 MG/ML ~~LOC~~ SOPN: 1.0000 "pen " | PEN_INJECTOR | SUBCUTANEOUS | Status: DC

## 2016-10-06 MED ORDER — PHENYLEPHRINE HCL 10 MG/ML IJ SOLN
INTRAMUSCULAR | Status: DC | PRN
  Administered 2016-10-06: 75 ug via INTRAVENOUS
  Administered 2016-10-06: 50 ug via INTRAVENOUS

## 2016-10-06 MED ORDER — BISACODYL 10 MG RE SUPP
10.0000 mg | Freq: Every day | RECTAL | Status: DC | PRN
  Administered 2016-10-08: 10 mg via RECTAL
  Filled 2016-10-06: qty 1

## 2016-10-06 MED ORDER — METOCLOPRAMIDE HCL 5 MG/ML IJ SOLN: 5.0000 mg | Freq: Three times a day (TID) | INTRAMUSCULAR | Status: DC | PRN

## 2016-10-06 SURGICAL SUPPLY — 59 items
BANDAGE ELASTIC 6 CLIP ST LF (GAUZE/BANDAGES/DRESSINGS) ×2 IMPLANT
BLADE SAW SAG 25X90X1.19 (BLADE) ×2 IMPLANT
BLADE SURG SZ20 CARB STEEL (BLADE) ×2 IMPLANT
BNDG COHESIVE 6X5 TAN STRL LF (GAUZE/BANDAGES/DRESSINGS) ×2 IMPLANT
BONE CEMENT PALACOSE (Orthopedic Implant) ×4 IMPLANT
CANISTER SUCT 1200ML W/VALVE (MISCELLANEOUS) ×2 IMPLANT
CANISTER SUCT 3000ML (MISCELLANEOUS) ×2 IMPLANT
CAPT KNEE TOTAL 3 ×2 IMPLANT
CATH TRAY METER 16FR LF (MISCELLANEOUS) ×2 IMPLANT
CEMENT BONE PALACOSE (Orthopedic Implant) ×2 IMPLANT
CHLORAPREP W/TINT 26ML (MISCELLANEOUS) ×2 IMPLANT
COOLER POLAR GLACIER W/PUMP (MISCELLANEOUS) ×2 IMPLANT
COVER MAYO STAND STRL (DRAPES) ×4 IMPLANT
CUFF TOURN 24 STER (MISCELLANEOUS) ×2 IMPLANT
CUFF TOURN 30 STER DUAL PORT (MISCELLANEOUS) IMPLANT
DRAPE IMP U-DRAPE 54X76 (DRAPES) ×2 IMPLANT
DRAPE INCISE IOBAN 66X45 STRL (DRAPES) ×4 IMPLANT
DRAPE SHEET LG 3/4 BI-LAMINATE (DRAPES) ×2 IMPLANT
DRSG OPSITE POSTOP 4X12 (GAUZE/BANDAGES/DRESSINGS) IMPLANT
DRSG OPSITE POSTOP 4X14 (GAUZE/BANDAGES/DRESSINGS) IMPLANT
DRSG OPSITE POSTOP 4X8 (GAUZE/BANDAGES/DRESSINGS) ×2 IMPLANT
ELECT CAUTERY BLADE 6.4 (BLADE) ×2 IMPLANT
ELECT REM PT RETURN 9FT ADLT (ELECTROSURGICAL) ×2
ELECTRODE REM PT RTRN 9FT ADLT (ELECTROSURGICAL) ×1 IMPLANT
GLOVE BIO SURGEON STRL SZ7.5 (GLOVE) ×4 IMPLANT
GLOVE BIO SURGEON STRL SZ8 (GLOVE) ×4 IMPLANT
GLOVE BIOGEL PI IND STRL 8 (GLOVE) ×1 IMPLANT
GLOVE BIOGEL PI INDICATOR 8 (GLOVE) ×1
GLOVE INDICATOR 8.0 STRL GRN (GLOVE) ×2 IMPLANT
GOWN STRL REUS W/ TWL LRG LVL3 (GOWN DISPOSABLE) ×2 IMPLANT
GOWN STRL REUS W/ TWL XL LVL3 (GOWN DISPOSABLE) ×1 IMPLANT
GOWN STRL REUS W/TWL LRG LVL3 (GOWN DISPOSABLE) ×2
GOWN STRL REUS W/TWL XL LVL3 (GOWN DISPOSABLE) ×1
HANDPIECE INTERPULSE COAX TIP (DISPOSABLE) ×1
HOLDER FOLEY CATH W/STRAP (MISCELLANEOUS) ×2 IMPLANT
HOOD PEEL AWAY FLYTE STAYCOOL (MISCELLANEOUS) ×6 IMPLANT
IMMBOLIZER KNEE 19 BLUE UNIV (SOFTGOODS) ×2 IMPLANT
KIT RM TURNOVER STRD PROC AR (KITS) ×2 IMPLANT
NDL SAFETY 18GX1.5 (NEEDLE) ×2 IMPLANT
NEEDLE 18GX1X1/2 (RX/OR ONLY) (NEEDLE) ×2 IMPLANT
NEEDLE SPNL 20GX3.5 QUINCKE YW (NEEDLE) ×2 IMPLANT
NS IRRIG 1000ML POUR BTL (IV SOLUTION) ×2 IMPLANT
PACK TOTAL KNEE (MISCELLANEOUS) ×2 IMPLANT
PAD WRAPON POLAR KNEE (MISCELLANEOUS) ×1 IMPLANT
PLATE KNEE TIBIAL 71MM FIXED (Plate) IMPLANT
SET HNDPC FAN SPRY TIP SCT (DISPOSABLE) ×1 IMPLANT
SOL .9 NS 3000ML IRR  AL (IV SOLUTION) ×1
SOL .9 NS 3000ML IRR UROMATIC (IV SOLUTION) ×1 IMPLANT
STAPLER SKIN PROX 35W (STAPLE) ×2 IMPLANT
SUCTION FRAZIER HANDLE 10FR (MISCELLANEOUS) ×1
SUCTION TUBE FRAZIER 10FR DISP (MISCELLANEOUS) ×1 IMPLANT
SUT VIC AB 0 CT1 36 (SUTURE) ×10 IMPLANT
SUT VIC AB 2-0 CT1 27 (SUTURE) ×5
SUT VIC AB 2-0 CT1 TAPERPNT 27 (SUTURE) ×5 IMPLANT
SYR 20CC LL (SYRINGE) ×2 IMPLANT
SYR 30ML LL (SYRINGE) ×4 IMPLANT
SYRINGE 10CC LL (SYRINGE) ×2 IMPLANT
SYSTEM VACUUM CEMENT MIXING ×2 IMPLANT
WRAPON POLAR PAD KNEE (MISCELLANEOUS) ×2

## 2016-10-06 NOTE — Care Management (Addendum)
Attempted assessment but patient is lethargic. Will reevaluate in the am.

## 2016-10-06 NOTE — H&P (Signed)
Paper H&P to be scanned into permanent record. H&P reviewed. No changes. 

## 2016-10-06 NOTE — Anesthesia Preprocedure Evaluation (Signed)
Anesthesia Evaluation  Patient identified by MRN, date of birth, ID band Patient awake    Reviewed: Allergy & Precautions, H&P , NPO status , Patient's Chart, lab work & pertinent test results, reviewed documented beta blocker date and time   History of Anesthesia Complications (+) PONV and history of anesthetic complications  Airway Mallampati: II  TM Distance: >3 FB Neck ROM: full    Dental no notable dental hx. (+) Caps, Missing, Poor Dentition   Pulmonary neg pulmonary ROS, former smoker,           Cardiovascular Exercise Tolerance: Good hypertension, (-) angina+ CAD, + Past MI, + Cardiac Stents and + Peripheral Vascular Disease  (-) CABG negative cardio ROS  (-) dysrhythmias (-) Valvular Problems/Murmurs     Neuro/Psych  Headaches, neg Seizures PSYCHIATRIC DISORDERS (Depression) CVA, Residual Symptoms    GI/Hepatic Neg liver ROS, GERD  Medicated,  Endo/Other  negative endocrine ROS  Renal/GU negative Renal ROS  negative genitourinary   Musculoskeletal   Abdominal   Peds  Hematology negative hematology ROS (+)   Anesthesia Other Findings Past Medical History: No date: Anxiety No date: Arthritis No date: CAD (coronary artery disease)     Comment: a. 03/2013 NSTEMI/Cath: 100 RCA, EF 45%->Med               Rx;  b. 12/2013 Cath/PCI: LAD 10m, 30d, D1 80,               D2 60, LCX nl, RCA 50ost/p, 26m, 95d (2.5x33               Xience DES);  c. 05/2014 Lexi MV: EF 68%, no               ischemia; d. stress echo 12/2015 no ischemia @               max exercise (did not achieve target) No date: Carotid disease, bilateral ([place])     Comment: a. 08/2013 Carotid U/S: 1-39% bilat ICA               stenosis. No date: Complication of anesthesia No date: CVA (cerebral vascular accident) ([place])     Comment: a. 08/2013. No date: Decreased libido No date: Depression No date: GERD (gastroesophageal reflux disease) No date:  History of recurrent UTIs No date: Hypercholesteremia No date: Hyperlipemia No date: Hypertension No date: Insomnia No date: Ischemic cardiomyopathy     Comment: a. 03/2013 Echo: EF 30-35%, mod dil LA, mod-sev              MR, mod TR;  b. 08/2013 Echo EF 60-65%, mild               AI. No date: Menopause No date: Migraines No date: Myocardial infarction No date: PONV (postoperative nausea and vomiting)     Comment: If anesthsia is given slowly, pt will not               throw up, if given quickly, pt will have nausea              and vomiting. No date: Right lower quadrant pain No date: Syncope and collapse No date: Tobacco abuse No date: Vaginal atrophy   Reproductive/Obstetrics negative OB ROS                             Anesthesia Physical Anesthesia Plan  ASA: III  Anesthesia Plan: Spinal  Post-op Pain Management:    Induction:   Airway Management Planned:   Additional Equipment:   Intra-op Plan:   Post-operative Plan:   Informed Consent: I have reviewed the patients History and Physical, chart, labs and discussed the procedure including the risks, benefits and alternatives for the proposed anesthesia with the patient or authorized representative who has indicated his/her understanding and acceptance.   Dental Advisory Given  Plan Discussed with: Anesthesiologist, CRNA and Surgeon  Anesthesia Plan Comments:         Anesthesia Quick Evaluation

## 2016-10-06 NOTE — NC FL2 (Signed)
[place] LEVEL OF CARE SCREENING TOOL     IDENTIFICATION  Patient Name: [name] Birthdate: 05-21-1956 Sex: female Admission Date (Current Location): 10/06/2016  [facility] and [place] Number:  Engineering geologist and Address:  [facility], [address] [place], [place] [address]      Provider Number: [id number]  Attending Physician Name and Address:  Corky Mull, MD  Relative Name and Phone Number:       Current Level of Care: SNF Recommended Level of Care: [place] Prior Approval Number:    Date Approved/Denied:   PASRR Number:    Discharge Plan: SNF    Current Diagnoses: Patient Active Problem List   Diagnosis Date Noted  . Status post total knee replacement using cement, left 10/06/2016  . History of coronary artery stent placement   . Pure hypercholesterolemia   . Recurrent UTI 02/11/2016  . Menopause 02/11/2016  . Angina pectoris ([place]) 01/07/2016  . Syncope 07/11/2015  . Orthostatic hypotension 02/19/2015  . Dehydration 02/19/2015  . Hypokalemia 02/19/2015  . Stroke ([place]) 02/18/2015  . Tobacco abuse 02/18/2015  . History of stroke 07/18/2014  . Vertigo 07/18/2014  . Bilateral wrist pain 07/18/2014  . Atypical chest pain 06/05/2014  . CAD (coronary artery disease) 01/19/2014  . Carotid arterial disease ([place]) 01/19/2014  . Depression 01/16/2014  . Memory loss 01/16/2014  . Right sided weakness 08/10/2013  . Hypertension 08/10/2013  . Hyperlipidemia 08/10/2013  . History of MI (myocardial infarction) 08/10/2013  . Stroke, acute, embolic ([place]) [id number]    Orientation RESPIRATION BLADDER Height & Weight     Self, Time, Situation, Place  O2 (Nasal Cannula: 2L/min) External catheter Weight: 140 lb (63.5 kg) Height:  5\' 2"  (157.5 cm)  BEHAVIORAL SYMPTOMS/MOOD NEUROLOGICAL BOWEL NUTRITION STATUS   (None.)  (None.) Continent Diet (Diet: Regular )  AMBULATORY STATUS COMMUNICATION OF NEEDS  Skin   Extensive Assist Verbally Surgical wounds (Incision: Left Knee)                       Personal Care Assistance Level of Assistance  Bathing, Feeding, Dressing Bathing Assistance: Limited assistance Feeding assistance: Independent Dressing Assistance: Limited assistance     Functional Limitations Info  Sight, Hearing, Speech Sight Info: Adequate Hearing Info: Adequate Speech Info: Adequate    SPECIAL CARE FACTORS FREQUENCY  PT (By licensed PT), OT (By licensed OT)     PT Frequency:  (5) OT Frequency:  (5)            Contractures      Additional Factors Info  Code Status, Allergies Code Status Info:  (Full Code) Allergies Info:  (Codeine Sulfate, Penicillins)           Current Medications (10/06/2016):  This is the current hospital active medication list Current Facility-Administered Medications  Medication Dose Route Frequency Provider Last Rate Last Dose  . acetaminophen (TYLENOL) tablet 650 mg  650 mg Oral Q6H PRN Corky Mull, MD       Or  . acetaminophen (TYLENOL) suppository 650 mg  650 mg Rectal Q6H PRN Corky Mull, MD      . acetaminophen (TYLENOL) tablet 1,000 mg  1,000 mg Oral Q6H Corky Mull, MD      . Alirocumab SOPN 1 pen  1 pen Subcutaneous Q14 Days Corky Mull, MD      . aspirin chewable tablet 81 mg  81 mg Oral Daily Marshall Cork  Poggi, MD      . atorvastatin (LIPITOR) tablet 80 mg  80 mg Oral QHS Corky Mull, MD      . bisacodyl (DULCOLAX) suppository 10 mg  10 mg Rectal Daily PRN Corky Mull, MD      . cholecalciferol (VITAMIN D) tablet 1,000 Units  1,000 Units Oral Daily Corky Mull, MD      . clindamycin (CLEOCIN) IVPB 900 mg  900 mg Intravenous Q6H Corky Mull, MD      . clopidogrel (PLAVIX) tablet 75 mg  75 mg Oral Daily Marshall Cork Poggi, MD      . dextrose 5 % and 0.9 % NaCl with KCl 20 mEq/L infusion   Intravenous Continuous Corky Mull, MD      . diphenhydrAMINE (BENADRYL) 12.5 MG/5ML elixir 12.5-25 mg  12.5-25 mg Oral Q4H PRN  Corky Mull, MD      . docusate sodium (COLACE) capsule 100 mg  100 mg Oral BID Corky Mull, MD      . DULoxetine (CYMBALTA) DR capsule 90 mg  90 mg Oral Daily Corky Mull, MD      . Derrill Memo ON 10/07/2016] enoxaparin (LOVENOX) injection 40 mg  40 mg Subcutaneous Q24H Corky Mull, MD      . ezetimibe (ZETIA) tablet 10 mg  10 mg Oral Daily Corky Mull, MD      . furosemide (LASIX) tablet 20 mg  20 mg Oral Daily PRN Corky Mull, MD      . HYDROmorphone (DILAUDID) injection 1-2 mg  1-2 mg Intravenous Q2H PRN Corky Mull, MD      . hydrOXYzine (ATARAX/VISTARIL) tablet 25 mg  25 mg Oral BID PRN Corky Mull, MD      . isosorbide mononitrate (IMDUR) 24 hr tablet 60 mg  60 mg Oral Daily Corky Mull, MD      . ketorolac (TORADOL) 15 MG/ML injection 15 mg  15 mg Intravenous Q6H Corky Mull, MD      . ketorolac (TORADOL) 15 MG/ML injection 30 mg  30 mg Intravenous Once Corky Mull, MD      . magnesium hydroxide (MILK OF MAGNESIA) suspension 30 mL  30 mL Oral Daily PRN Corky Mull, MD      . metoCLOPramide (REGLAN) tablet 5-10 mg  5-10 mg Oral Q8H PRN Corky Mull, MD       Or  . metoCLOPramide (REGLAN) injection 5-10 mg  5-10 mg Intravenous Q8H PRN Corky Mull, MD      . metoprolol succinate (TOPROL-XL) 24 hr tablet 25 mg  25 mg Oral q morning - 10a Corky Mull, MD      . nitrofurantoin (macrocrystal-monohydrate) (MACROBID) capsule 100 mg  100 mg Oral QHS Corky Mull, MD      . nitroGLYCERIN (NITROSTAT) SL tablet 0.4 mg  0.4 mg Sublingual Q5 min PRN Corky Mull, MD      . ondansetron [facility]) tablet 4 mg  4 mg Oral Q6H PRN Corky Mull, MD       Or  . ondansetron (ZOFRAN) injection 4 mg  4 mg Intravenous Q6H PRN Corky Mull, MD      . oxyCODONE (Oxy IR/ROXICODONE) immediate release tablet 5-10 mg  5-10 mg Oral Q3H PRN Corky Mull, MD      . pantoprazole (PROTONIX) EC tablet 40 mg  40 mg Oral QHS Corky Mull, MD      .  potassium chloride SA (K-DUR,KLOR-CON) CR tablet 20 mEq  20 mEq Oral  TID Corky Mull, MD      . sertraline (ZOLOFT) tablet 200 mg  200 mg Oral Daily Corky Mull, MD      . sodium phosphate (FLEET) 7-19 GM/118ML enema 1 enema  1 enema Rectal Once PRN Corky Mull, MD      . triamterene-hydrochlorothiazide (MAXZIDE-25) 37.5-25 MG per tablet 1 tablet  1 tablet Oral Daily Corky Mull, MD      . zolpidem (AMBIEN) tablet 5 mg  5 mg Oral QHS PRN Corky Mull, MD         Discharge Medications: Please see discharge summary for a list of discharge medications.  Relevant Imaging Results:  Relevant Lab Results:   Additional Information  (SSN: [id number])  Danie Chandler, Student-Social Work

## 2016-10-06 NOTE — Anesthesia Procedure Notes (Signed)
Spinal  Patient location during procedure: OR Start time: 10/06/2016 7:31 AM End time: 10/06/2016 7:43 AM Staffing Anesthesiologist: Martha Clan Performed: anesthesiologist  Preanesthetic Checklist Completed: patient identified, site marked, surgical consent, pre-op evaluation, timeout performed, IV checked, risks and benefits discussed and monitors and equipment checked Spinal Block Patient position: sitting Prep: ChloraPrep Patient monitoring: heart rate, continuous pulse ox, blood pressure and cardiac monitor Approach: midline Location: L3-4 Injection technique: single-shot Needle Needle type: [place] and Introducer  Needle gauge: 24 G Needle length: 9 cm Assessment Sensory level: T10 Additional Notes Negative paresthesia. Negative blood return. Positive free-flowing CSF. Expiration date of kit checked and confirmed. Patient tolerated procedure well, without complications.

## 2016-10-06 NOTE — Evaluation (Signed)
Physical Therapy Evaluation Patient Details Name: [name] MRN: [id number] DOB: 09/02/1956 Today's Date: 10/06/2016   History of Present Illness  Pt underwent L TKR without reported post-op complications. Pt is POD#0 at time of initatl evaluation  Clinical Impression  Pt admitted with above diagnosis. Pt currently with functional limitations due to the deficits listed below (see PT Problem List). Pt demonstrates good strength and mobility for POD#0. She requires CGA only for bed mobility, transfers, and limited ambulation from bed to recliner. Pt moves slowly with minimal weight shift to LLE and heavy UE support. However she is safe and steady with use of rolling walker. Pt able to complete all bed exercises as instructed. L knee AAROM is 0-55 degrees and is primarily limited by pain during flexion overpressure. Pt reports she has 38 steps to enter her third story apartment. She reports she has no family/friends that she can stay with at discharge. Recommendation for SNF is based on the difficulty she will experience with these stairs at discharge. Pt will benefit from skilled PT services to address deficits in strength, balance, and mobility in order to return to full function at home.     Follow Up Recommendations SNF    Equipment Recommendations  Rolling walker with 5" wheels    Recommendations for Other Services       Precautions / Restrictions Precautions Precautions: Knee Precaution Booklet Issued: Yes (comment) Required Braces or Orthoses: Knee Immobilizer - Left Knee Immobilizer - Left: Discontinue once straight leg raise with < 10 degree lag Restrictions Weight Bearing Restrictions: Yes LLE Weight Bearing: Weight bearing as tolerated      Mobility  Bed Mobility Overal bed mobility: Needs Assistance Bed Mobility: Supine to Sit     Supine to sit: Min guard     General bed mobility comments: Pt requires increased tme to perform but overall demonstrates good LLE  abduction and hip flexion strength when exiting the bed to the L. Able to scoot up toward EOB without assistance and good L quad eccentric control when lowering L foot toward ground  Transfers Overall transfer level: Needs assistance Equipment used: Rolling walker (2 wheeled) Transfers: Sit to/from Stand Sit to Stand: Min guard         General transfer comment: Pt requiring increased time to come to standing with minimal weight acceptance to LLE. Good stability in standing with UE support  Ambulation/Gait Ambulation/Gait assistance: Min guard Ambulation Distance (Feet): 5 Feet Assistive device: Rolling walker (2 wheeled) Gait Pattern/deviations: Step-to pattern;Decreased step length - right;Decreased weight shift to left;Antalgic Gait velocity: Decreased Gait velocity interpretation: <1.8 ft/sec, indicative of risk for recurrent falls General Gait Details: Pt able to ambulate from bed to recliner with rolling walker. Cues and education provided for proper sequencing with walker. Pt denies DOE and no signs of difficulty with breathing. Pt is steady with rolling walker. Demonstrates decreased weight shift to LLE  Stairs            Wheelchair Mobility    Modified Rankin (Stroke Patients Only)       Balance Overall balance assessment: Needs assistance Sitting-balance support: No upper extremity supported Sitting balance-Leahy Scale: Normal     Standing balance support: Bilateral upper extremity supported Standing balance-Leahy Scale: Fair Standing balance comment: Fair balance with bilateral UE support and minimal weight shift to LLE  Pertinent Vitals/Pain Pain Assessment: 0-10 Pain Score: 8  Pain Location: Left knee Pain Descriptors / Indicators: Aching;Sharp;Operative site guarding Pain Intervention(s): Limited activity within patient's tolerance;Premedicated before session;Monitored during session    [place] expects to be discharged to:: Private residence Living Arrangements: Alone Available Help at Discharge: Family Type of Home: Apartment Home Access: Stairs to enter Entrance Stairs-Rails: Left Entrance Stairs-Number of Steps: [place]: One level Home Equipment: None Additional Comments: No assistive devices    Prior Function Level of Independence: Independent         Comments: Full community ambulator without assistive device. Drives. Independent with ADLs/IADLs. Denies falls     Hand Dominance   Dominant Hand: Right    Extremity/Trunk Assessment   Upper Extremity Assessment: Overall WFL for tasks assessed           Lower Extremity Assessment: LLE deficits/detail   LLE Deficits / Details: Pt reports mild decrease in light touch sensation to LLE post-operatively but mostly intact. Full PF/DF strength noted. Able to perform SLR and SAQ without assistance     Communication   Communication: No difficulties  Cognition Arousal/Alertness: Lethargic;Suspect due to medications Behavior During Therapy: [facility] for tasks assessed/performed Overall Cognitive Status: Within Functional Limits for tasks assessed                      General Comments      Exercises Total Joint Exercises Ankle Circles/Pumps: Strengthening;Both;10 reps;Supine Quad Sets: Strengthening;Both;10 reps;Supine Gluteal Sets: Strengthening;Both;10 reps;Supine Towel Squeeze: Strengthening;Both;10 reps;Supine Short Arc Quad: Strengthening;Left;10 reps;Supine Heel Slides: Strengthening;Left;10 reps;Supine Hip ABduction/ADduction: Strengthening;Left;10 reps;Supine Straight Leg Raises: Strengthening;Left;10 reps;Supine Goniometric ROM: 0- 55 degrees AAROM, pain limited   Assessment/Plan    PT Assessment Patient needs continued PT services  PT Problem List Decreased strength;Decreased range of motion;Decreased balance;Decreased mobility;Decreased knowledge of use of DME;Pain           PT Treatment Interventions DME instruction;Gait training;Stair training;Therapeutic activities;Therapeutic exercise;Balance training;Neuromuscular re-education;Patient/family education;Manual techniques    PT Goals (Current goals can be found in the Care Plan section)  Acute Rehab PT Goals Patient Stated Goal: Return to prior level of function with less pain PT Goal Formulation: With patient/family Time For Goal Achievement: 10/20/16 Potential to Achieve Goals: Good    Frequency BID   Barriers to discharge Inaccessible home environment Pt reports she has 38 stairs to get to her third floor apartment    Co-evaluation               End of Session Equipment Utilized During Treatment: Gait belt Activity Tolerance: Patient tolerated treatment well Patient left: in chair;with call bell/phone within reach;with chair alarm set;with SCD's reapplied;Other (comment) (towel roll under heel, polar care in place)           Time: 1455-1530 PT Time Calculation (min) (ACUTE ONLY): 35 min   Charges:   PT Evaluation $PT Eval Low Complexity: 1 Procedure PT Treatments $Therapeutic Exercise: 8-22 mins   PT G Codes:       Lyndel Safe Clotine Heiner PT, DPT   Evelia Waskey 10/06/2016, 3:45 PM

## 2016-10-06 NOTE — Transfer of Care (Signed)
Immediate Anesthesia Transfer of Care Note  Patient: [name]  Procedure(s) Performed: Procedure(s): TOTAL KNEE ARTHROPLASTY (Left)  Patient Location: PACU  Anesthesia Type:Spinal  Level of Consciousness: awake, alert , oriented and patient cooperative  Airway & Oxygen Therapy: Patient Spontanous Breathing and Patient connected to nasal cannula oxygen  Post-op Assessment: Report given to RN and Post -op Vital signs reviewed and stable  Post vital signs: Reviewed and stable  Last Vitals:  Vitals:   10/06/16 0628  BP: (!) 124/58  Pulse: 72  Resp: 14  Temp: 36.6 C    Last Pain:  Vitals:   10/06/16 0628  TempSrc: Oral  PainSc: 9          Complications: No apparent anesthesia complications

## 2016-10-06 NOTE — Op Note (Signed)
10/06/2016  10:08 AM  Patient:   [name]  Pre-Op Diagnosis:   Degenerative joint disease, left knee.  Post-Op Diagnosis:   Same  Procedure:   Left TKA using all-cemented [employer] Vanguard system with a 65 mm PCR femur, a 67 mm tibial tray with a 10 mm E-poly insert, and a 31 x 8 mm all-poly 3-pegged domed patella.  Surgeon:   Pascal Lux, MD  Assistant:   Cameron Proud, PA-C   Anesthesia:   Spinal  Findings:   As above  Complications:   None  EBL:   10 cc  Fluids:   1000 cc crystalloid  UOP:   100 cc  TT:   95 minutes at 300 mmHg  Drains:   None  Closure:   Staples  Implants:   As above  Brief Clinical Note:   The patient is a 60 year old female with a long history of progressively worsening left knee pain. The patient's symptoms have progressed despite medications, activity modification, injections, etc. The patient's history and examination were consistent with advanced degenerative joint disease of the right knee confirmed by plain radiographs. The patient presents at this time for a left total knee arthroplasty.  Procedure:   The patient was brought into the operating room. After adequate spinal anesthesia was obtained, the patient was lain in the supine position. A Foley catheter was placed by the nurse before the right lower extremity was prepped with ChloraPrep solution and draped sterilely. Preoperative antibiotics were administered. After verifying the proper laterality with a surgical timeout, the limb was exsanguinated with an Esmarch and the tourniquet inflated to 300 mmHg. A standard anterior approach to the knee was made through an approximately 7 inch incision. The incision was carried down through the subcutaneous tissues to expose superficial retinaculum. This was split the length of the incision and the medial flap elevated sufficiently to expose the medial retinaculum. The medial retinaculum was incised, leaving a 3-4 mm cuff of tissue on the patella.  This was extended distally along the medial border of the patellar tendon and proximally through the medial third of the quadriceps tendon. A subtotal fat pad excision was performed before the soft tissues were elevated off the anteromedial and anterolateral aspects of the proximal tibia to the level of the collateral ligaments. The anterior portions of the medial and lateral menisci were removed, as was the anterior cruciate ligament. With the knee flexed to 90, the external tibial guide was positioned and the appropriate proximal tibial cut made. This piece was taken to the back table where it was measured and found to be optimally replicated by a 71 mm component.  Attention was directed to the distal femur. The intramedullary canal was accessed through a 3/8" drill hole. The intramedullary guide was inserted and position in order to obtain a neutral flexion gap. The intercondylar block was positioned with care taken to avoid notching the anterior cortex of the femur. The appropriate cut was made. Next, the distal cutting block was placed at 6 of valgus alignment. Using the 9 mm slot, the distal cut was made. The distal femur was measured and found to be optimally replicated by the 65 mm component. The 65 mm 4-in-1 cutting block was positioned and first the posterior, then the posterior chamfer, the anterior chamfer, femoral and intercondylar cuts were made. At this point, the posterior portions medial and lateral menisci were removed. The tibial tray size was reassessed and it was elected to drop down to a 67  mm component. However, the 71 mm tibial tray had already been opened on the back table, so it was wasted. A trial reduction was performed using the appropriate femoral and tibial components with the 10 mm insert. This demonstrated excellent stability to varus and valgus stressing both in flexion and extension while permitting full extension. Patella tracking was assessed and found to be excellent.  Therefore, the tibial guide position was marked on the proximal tibia. The patella thickness was measured and found to be 20 mm. Therefore, the appropriate cut was made. The surfaces were measured and found to be optimally replicated by the 31 mm component. The three peg holes were drilled in place before the trial button was inserted. Patella tracking was assessed and found to be excellent, passing the "no thumb test". The lug holes were drilled into the distal femur before the trial component was removed, leaving only the tibial tray. The keel was then created using the appropriate tower, reamer, and punch.  The bony surfaces were prepared for cementing by irrigating thoroughly with bacitracin saline solution. A bone plug was fashioned from some of the bone that had been removed previously and used to plug the distal femoral canal. In addition, 20 cc of Exparel diluted out to 60 cc with normal saline and 30 cc of 0.5% Sensorcaine were injected into the postero-medial and postero-lateral aspects of the knee, the medial and lateral gutter regions, and the peri-incisional tissues to help with postoperative analgesia. Meanwhile, the cement was being mixed on the back table. When it was ready, the 67 mm tibial tray was cemented in first. The excess cement was removed using [employer] fast streamer. Next, the femoral component was impacted into place. Again, the excess cement was removed using [employer] fast streamer. The 10 mm trial insert was positioned and the knee brought into extension while the cement hardened. Finally, the patella was cemented into place and secured using the patellar clamp. Again, the excess cement was removed using [employer] fast streamer. Once the cement had hardened, the knee was placed through a range of motion with the findings as described above. Therefore, the trial insert was removed and, after verifying that no cement had been retained posteriorly, the permanent insert was positioned and secured using the  appropriate key locking mechanism. Again the knee was placed through a range of motion with the findings as described above.  The wound was copiously irrigated with bacitracin saline solution using the jet lavage system before the quadriceps tendon and retinacular layer were reapproximated using #0 Vicryl interrupted sutures. The superficial retinacular layer was closed using 2-0 Vicryl interrupted sutures in several layers for the skin was closed using staples. A sterile honeycomb dressing was applied to the skin before the leg was wrapped with an Ace wrap to accommodate the polar pack. The patient was then awakened and returned to the recovery room in satisfactory condition after tolerating the procedure well.

## 2016-10-07 ENCOUNTER — Encounter: Payer: Self-pay | Admitting: Surgery

## 2016-10-07 LAB — BASIC METABOLIC PANEL
Anion gap: 5 (ref 5–15)
BUN: 20 mg/dL (ref 6–20)
CHLORIDE: 104 mmol/L (ref 101–111)
CO2: 27 mmol/L (ref 22–32)
CREATININE: 0.82 mg/dL (ref 0.44–1.00)
Calcium: 8.6 mg/dL — ABNORMAL LOW (ref 8.9–10.3)
GFR calc Af Amer: >60 mL/min (ref >60)
GFR calc non Af Amer: >60 mL/min (ref >60)
GLUCOSE: 164 mg/dL — AB (ref 65–99)
POTASSIUM: 3.8 mmol/L (ref 3.5–5.1)
Sodium: 136 mmol/L (ref 135–145)

## 2016-10-07 LAB — CBC WITH DIFFERENTIAL/PLATELET
Basophils Absolute: 0 10*3/uL (ref 0–0.1)
Basophils Relative: 0 %
EOS ABS: 0 10*3/uL (ref 0–0.7)
EOS PCT: 0 %
HCT: 30.7 % — ABNORMAL LOW (ref 35.0–47.0)
HEMOGLOBIN: 10.5 g/dL — AB (ref 12.0–16.0)
LYMPHS ABS: 1.2 10*3/uL (ref 1.0–3.6)
LYMPHS PCT: 10 %
MCH: 31.8 pg (ref 26.0–34.0)
MCHC: 34.3 g/dL (ref 32.0–36.0)
MCV: 92.7 fL (ref 80.0–100.0)
MONOS PCT: 7 %
Monocytes Absolute: 0.8 10*3/uL (ref 0.2–0.9)
Neutro Abs: 9.9 10*3/uL — ABNORMAL HIGH (ref 1.4–6.5)
Neutrophils Relative %: 83 %
PLATELETS: 220 10*3/uL (ref 150–440)
RBC: 3.31 MIL/uL — AB (ref 3.80–5.20)
RDW: 13.1 % (ref 11.5–14.5)
WBC: 11.9 10*3/uL — AB (ref 3.6–11.0)

## 2016-10-07 NOTE — Clinical Social Work Note (Signed)
Clinical Social Work Assessment  Patient Details  Name: [name] MRN: [id number] Date of Birth: 07-08-1956  Date of referral:  10/07/16               Reason for consult:  Facility Placement                Permission sought to share information with:  Chartered certified accountant granted to share information::  Yes, Verbal Permission Granted  Name::      [name]::   [name]   Relationship::     Contact Information:     Housing/Transportation Living arrangements for the past 2 months:  [place] of Information:  Patient Patient Interpreter Needed:  None Criminal Activity/Legal Involvement Pertinent to Current Situation/Hospitalization:  No - Comment as needed Significant Relationships:  Siblings Lives with:  Self Do you feel safe going back to the place where you live?  Yes Need for family participation in patient care:  Yes (Comment)  Care giving concerns:  Patient lives alone in an apartment in [place].    Social Worker assessment / plan:  Holiday representative (CSW) received SNF consult. PT is recommending SNF. CSW met with patient and her sister [name] was at bedside. CSW introduced self and explained role of CSW department. Patient reported that she lives in an apartment alone and her sister is her primary support. CSW explained that PT is recommending SNF. CSW also explained that patient's insurance [name] will require authorization. Patient is agreeable to SNF search in [facility].   FL2 complete and faxed out. CSW presented bed offers to patient. She chose [name]. [name] liaison is aware of accepted bed offer. [name] case manager is aware of above. CSW will continue to follow and assist as needed.   Employment status:  Retired Nurse, adult PT Recommendations:  [place] / Referral to community resources:  [place]  Patient/Family's Response to care:  Patient is agreeable to going to [name].   Patient/Family's Understanding of and Emotional Response to Diagnosis, Current Treatment, and Prognosis:  Patient was pleasant and thanked CSW for assistance.   Emotional Assessment Appearance:  Appears stated age Attitude/Demeanor/Rapport:    Affect (typically observed):  Accepting, Adaptable, Pleasant Orientation:  Oriented to Self, Oriented to Place, Oriented to  Time, Oriented to Situation Alcohol / Substance use:  Not Applicable Psych involvement (Current and /or in the community):  No (Comment)  Discharge Needs  Concerns to be addressed:  Discharge Planning Concerns Readmission within the last 30 days:  No Current discharge risk:  Dependent with Mobility Barriers to Discharge:  Continued Medical Work up   [employer], Veronia Beets, LCSW 10/07/2016, 12:23 PM

## 2016-10-07 NOTE — Clinical Social Work Placement (Signed)
   CLINICAL SOCIAL WORK PLACEMENT  NOTE  Date:  10/07/2016  Patient Details  Name: [name] MRN: [id number] Date of Birth: 28-Sep-1956  Clinical Social Work is seeking post-discharge placement for this patient at the [place] level of care (*CSW will initial, date and re-position this form in  chart as items are completed):  Yes   Patient/family provided with [place] Work Department's list of facilities offering this level of care within the geographic area requested by the patient (or if unable, by the patient's family).  Yes   Patient/family informed of their freedom to choose among providers that offer the needed level of care, that participate in Medicare, Medicaid or managed care program needed by the patient, have an available bed and are willing to accept the patient.  Yes   Patient/family informed of [facility]'s ownership interest in [facility] and [facility], as well as of the fact that they are under no obligation to receive care at these facilities.  PASRR submitted to EDS on 10/06/16     PASRR number received on 10/06/16     Existing PASRR number confirmed on       FL2 transmitted to all facilities in geographic area requested by pt/family on 10/06/16     FL2 transmitted to all facilities within larger geographic area on       Patient informed that his/her managed care company has contracts with or will negotiate with certain facilities, including the following:        Yes   Patient/family informed of bed offers received.  Patient chooses bed at  ([facility] )     Physician recommends and patient chooses bed at      Patient to be transferred to   on  .  Patient to be transferred to facility by       Patient family notified on   of transfer.  Name of family member notified:        PHYSICIAN       Additional Comment:    _______________________________________________ Deneen Slager, Veronia Beets, LCSW 10/07/2016, 12:22  PM

## 2016-10-07 NOTE — Progress Notes (Signed)
Physical Therapy Treatment Patient Details Name: [name] MRN: [id number] DOB: Sep 17, 1956 Today's Date: 10/07/2016    History of Present Illness Pt underwent L TKR without reported post-op complications. Pt is POD#0 at time of initatl evaluation    PT Comments    Pt sleeping in chair, but easily awoken. Agreeable to PT. Reports 7/10 left knee pain. Pt wishes back to bed. Transfers demonstrated slower than baseline, but with good safety from all surfaces. Pt requires instruction for improved gait quality to reciprocal pattern. Ambulation slow/guarded, but steady. Pt participates well in supine exercises with progressing range. Encouraged consistent work on strength and stretching throughout the day with exercises and functionally with sit to/from stand transfers. Continue PT to progress strength, endurance to improve all functional mobility.   Follow Up Recommendations  SNF     Equipment Recommendations  Rolling walker with 5" wheels    Recommendations for Other Services       Precautions / Restrictions Precautions Precautions: Knee Required Braces or Orthoses:  (pt able to SLR 10x without lag) Restrictions Weight Bearing Restrictions: Yes LLE Weight Bearing: Weight bearing as tolerated    Mobility  Bed Mobility Overal bed mobility: Needs Assistance Bed Mobility: Sit to Supine       Sit to supine: Supervision   General bed mobility comments: self assists LLE  Transfers Overall transfer level: Needs assistance Equipment used: Rolling walker (2 wheeled) Transfers: Sit to/from Stand Sit to Stand: Min guard         General transfer comment: From recliner, BSC and bed. Good use of hands, slow to rise/sit. Encouraged use of LLE as much as possible. Pt tends to rely mostly on R non operative LE  Ambulation/Gait Ambulation/Gait assistance: Min guard Ambulation Distance (Feet): 80 Feet Assistive device: Rolling walker (2 wheeled) Gait Pattern/deviations: Step-to  pattern;Step-through pattern;Antalgic Gait velocity: Decreased Gait velocity interpretation: Below normal speed for age/gender General Gait Details: Initially step to pattern with too large of a step with left. Re instructed with improved reciprocal gait pattern with small stride   Stairs            Wheelchair Mobility    Modified Rankin (Stroke Patients Only)       Balance Overall balance assessment: Needs assistance Sitting-balance support: No upper extremity supported;Feet supported Sitting balance-Leahy Scale: Normal     Standing balance support: Bilateral upper extremity supported Standing balance-Leahy Scale: Fair                      Cognition Arousal/Alertness: Lethargic Behavior During Therapy: WFL for tasks assessed/performed Overall Cognitive Status: Within Functional Limits for tasks assessed                      Exercises Total Joint Exercises Ankle Circles/Pumps: AROM;Both;20 reps;Supine Quad Sets: Strengthening;Both;20 reps;Supine Heel Slides: AAROM;Left;20 reps;Supine Straight Leg Raises: Strengthening;Left;10 reps;Supine Long Arc Quad: AROM;Left;15 reps;Seated Knee Flexion: AAROM;Left;10 reps;Supine (3 positions each rep with 10 sec hold each postion) Goniometric ROM: 0-90 Other Exercises Other Exercises: set up for toiletting on BSC    General Comments        Pertinent Vitals/Pain Pain Assessment: 0-10 Pain Score: 7  Pain Location: L knee Pain Intervention(s): Limited activity within patient's tolerance;Monitored during session;Premedicated before session;Repositioned;Ice applied    Home Living                      Prior Function  PT Goals (current goals can now be found in the care plan section) Progress towards PT goals: Progressing toward goals    Frequency    BID      PT Plan Current plan remains appropriate    Co-evaluation             End of Session           Time:  [id number] PT Time Calculation (min) (ACUTE ONLY): 31 min  Charges:  $Gait Training: 8-22 mins $Therapeutic Exercise: 8-22 mins                    G CodesLarae Grooms, PTA 10/07/2016, 3:04 PM

## 2016-10-07 NOTE — Progress Notes (Signed)
Physical Therapy Treatment Patient Details Name: [name] MRN: [id number] DOB: 17-Dec-1955 Today's Date: 10/07/2016    History of Present Illness Pt underwent L TKR without reported post-op complications. Pt is POD#0 at time of initatl evaluation    PT Comments    [name] is making good progress but continued to need min guard assist for bed mobility, transfers, and ambulation.  She requires cues for a step through gait pattern which she was able to perform but ambulatory distance limited by pain in L knee.  She tolerated all interventions well this date. Recommendation for SNF at d/c remains appropriate.  Pt will benefit from continued skilled PT services to increase functional independence and safety.   Follow Up Recommendations  SNF     Equipment Recommendations  Rolling walker with 5" wheels    Recommendations for Other Services       Precautions / Restrictions Precautions Precautions: Knee Precaution Booklet Issued: Yes (comment) Required Braces or Orthoses: Knee Immobilizer - Left Knee Immobilizer - Left: Discontinue once straight leg raise with < 10 degree lag (Pt able to achieve SLR x10 without extension lag) Restrictions Weight Bearing Restrictions: Yes LLE Weight Bearing: Weight bearing as tolerated    Mobility  Bed Mobility Overal bed mobility: Needs Assistance Bed Mobility: Supine to Sit     Supine to sit: Min guard     General bed mobility comments: Increased time and effort with use of bed rail and HOB elevated.  No assist required.  Transfers Overall transfer level: Needs assistance Equipment used: Rolling walker (2 wheeled) Transfers: Sit to/from Stand Sit to Stand: Min guard         General transfer comment: Slow to stand but pt steady, min gaurd for safety.  Pt initially reaching for RW with both hands when standing and requires cues for proper hand placement.  Ambulation/Gait Ambulation/Gait assistance: Min guard Ambulation Distance  (Feet): 100 Feet Assistive device: Rolling walker (2 wheeled) Gait Pattern/deviations: Step-to pattern;Step-through pattern;Antalgic;Decreased weight shift to left;Decreased stride length;Decreased stance time - left;Decreased step length - right Gait velocity: Decreased Gait velocity interpretation: Below normal speed for age/gender General Gait Details: Pt initially performing step to gait pattern with cues for sequencing with RW.  Cues for upright posture, forward gaze, and to relax shoulders.  Pt transitions to a step through gait pattern, cues to keep RW moving.    Stairs            Wheelchair Mobility    Modified Rankin (Stroke Patients Only)       Balance Overall balance assessment: Needs assistance Sitting-balance support: No upper extremity supported;Feet supported Sitting balance-Leahy Scale: Normal     Standing balance support: During functional activity;No upper extremity supported Standing balance-Leahy Scale: Fair Standing balance comment: Pt able to stand statically without UE support but requires RW for support with dynamic activity                    Cognition Arousal/Alertness: Awake/alert Behavior During Therapy: WFL for tasks assessed/performed Overall Cognitive Status: Within Functional Limits for tasks assessed                      Exercises Total Joint Exercises Ankle Circles/Pumps: Both;10 reps;Supine;AROM Quad Sets: Strengthening;Both;10 reps;Supine;Other (comment) (with 3 second holds) Hip ABduction/ADduction: Strengthening;Left;10 reps;Seated Straight Leg Raises: Strengthening;Left;10 reps;Supine Long Arc Quad: Strengthening;Left;10 reps;Seated Knee Flexion: AAROM;Left;10 reps;Seated;Other (comment) (with 5 second holds into flexion) Goniometric ROM: ~90 deg L knee flexion with  AAROM exercise Other Exercises Other Exercises: Weight shifting in standing in staggerd stance with LLE behind accepting weight for involuntary quad  contraction.  x10 with UEs supported on RW    General Comments        Pertinent Vitals/Pain Pain Assessment: 0-10 Pain Score: 9  Pain Location: L knee while ambulating Pain Descriptors / Indicators: Aching;Grimacing;Guarding Pain Intervention(s): Limited activity within patient's tolerance;Monitored during session;Repositioned;Premedicated before session    Home Living                      Prior Function            PT Goals (current goals can now be found in the care plan section) Acute Rehab PT Goals Patient Stated Goal: Return to prior level of function with less pain PT Goal Formulation: With patient/family Time For Goal Achievement: 10/20/16 Potential to Achieve Goals: Good Progress towards PT goals: Progressing toward goals    Frequency    BID      PT Plan Current plan remains appropriate    Co-evaluation             End of Session Equipment Utilized During Treatment: Gait belt Activity Tolerance: Patient tolerated treatment well;Patient limited by pain Patient left: in chair;with call bell/phone within reach;with chair alarm set;Other (comment) (pillow under lower L leg, polar care in place)     Time: [id number] PT Time Calculation (min) (ACUTE ONLY): 29 min  Charges:  $Gait Training: 8-22 mins $Therapeutic Exercise: 8-22 mins                    G Codes:      Collie Siad PT, DPT 10/07/2016, 9:36 AM

## 2016-10-07 NOTE — Anesthesia Postprocedure Evaluation (Signed)
Anesthesia Post Note  Patient: CHISTINA JADWIN  Procedure(s) Performed: Procedure(s) (LRB): TOTAL KNEE ARTHROPLASTY (Left)  Patient location during evaluation: Nursing Unit Anesthesia Type: Spinal Level of consciousness: awake, awake and alert and oriented Pain management: pain level controlled Vital Signs Assessment: post-procedure vital signs reviewed and stable Respiratory status: spontaneous breathing Cardiovascular status: stable Postop Assessment: no headache and no backache Anesthetic complications: no    Last Vitals:  Vitals:   10/06/16 2348 10/07/16 0354  BP: (!) 103/58 (!) 103/50  Pulse: 63 66  Resp: 18 16  Temp: 36.4 C 36.4 C    Last Pain:  Vitals:   10/07/16 0354  TempSrc: Oral  PainSc:                  Ricki Miller

## 2016-10-07 NOTE — Progress Notes (Signed)
Subjective: 1 Day Post-Op Procedure(s) (LRB): TOTAL KNEE ARTHROPLASTY (Left) Patient reports pain as mild.   Patient is well, and has had no acute complaints or problems Plan is to go Skilled nursing facility after hospital stay. Negative for chest pain and shortness of breath Fever: no Gastrointestinal:Negative for nausea and vomiting  Objective: Vital signs in last 24 hours: Temp:  [97.4 F (36.3 C)-99 F (37.2 C)] 97.5 F (36.4 C) (11/29 0354) Pulse Rate:  [62-75] 66 (11/29 0354) Resp:  [9-18] 16 (11/29 0354) BP: (95-112)/(50-68) 103/50 (11/29 0354) SpO2:  [92 %-100 %] 96 % (11/29 0354) FiO2 (%):  [21 %] 21 % (11/28 1148)  Intake/Output from previous day:  Intake/Output Summary (Last 24 hours) at 10/07/16 0750 Last data filed at 10/07/16 0640  Gross per 24 hour  Intake          2858.33 ml  Output             1160 ml  Net          1698.33 ml    Intake/Output this shift: No intake/output data recorded.  Labs:  Recent Labs  10/06/16 0648 10/07/16 0530  HGB 12.9 10.5*    Recent Labs  10/06/16 0648 10/07/16 0530  WBC  --  11.9*  RBC  --  3.31*  HCT 38.0 30.7*  PLT  --  220    Recent Labs  10/06/16 0648 10/07/16 0530  NA 140 136  K 3.4* 3.8  CL  --  104  CO2  --  27  BUN  --  20  CREATININE  --  0.82  GLUCOSE 94 164*  CALCIUM  --  8.6*   No results for input(s): LABPT, INR in the last 72 hours.   EXAM General - Patient is Alert, Appropriate and Oriented Extremity - Neurologically intact ABD soft Intact pulses distally Dorsiflexion/Plantar flexion intact Incision: dressing C/D/I No cellulitis present Dressing/Incision - clean, dry, no drainage Motor Function - intact, moving foot and toes well on exam.   Abdomen soft on exam with normal BS.  Past Medical History:  Diagnosis Date  . Anxiety   . Arthritis   . CAD (coronary artery disease)    a. 03/2013 NSTEMI/Cath: 100 RCA, EF 45%->Med Rx;  b. 12/2013 Cath/PCI: LAD 41m, 30d, D1 80, D2  60, LCX nl, RCA 50ost/p, 46m, 95d (2.5x33 Xience DES);  c. 05/2014 Lexi MV: EF 68%, no ischemia; d. stress echo 12/2015 no ischemia @ max exercise (did not achieve target)  . Carotid disease, bilateral ([place])    a. 08/2013 Carotid U/S: 1-39% bilat ICA stenosis.  . Complication of anesthesia   . CVA (cerebral vascular accident) ([place])    a. 08/2013.  [name]   . Depression   . GERD (gastroesophageal reflux disease)   . History of recurrent UTIs   . Hypercholesteremia   . Hyperlipemia   . Hypertension   . Insomnia   . Ischemic cardiomyopathy    a. 03/2013 Echo: EF 30-35%, mod dil LA, mod-sev MR, mod TR;  b. 08/2013 Echo EF 60-65%, mild AI.  Marland Kitchen Menopause   . Migraines   . Myocardial infarction   . PONV (postoperative nausea and vomiting)    If anesthsia is given slowly, pt will not throw up, if given quickly, pt will have nausea and vomiting.  . Right lower quadrant pain   . Syncope and collapse   . Tobacco abuse   . Vaginal atrophy     Assessment/Plan:  1 Day Post-Op Procedure(s) (LRB): TOTAL KNEE ARTHROPLASTY (Left) Active Problems:   Status post total knee replacement using cement, left  Estimated body mass index is 25.61 kg/m as calculated from the following:   Height as of this encounter: 5\' 2"  (1.575 m).   Weight as of this encounter: 63.5 kg (140 lb). Advance diet Up with therapy D/C IV fluids when tolerating PO intake.  Labs reviewed, K+ 3.8 and Hg stable at 10.5.  WBC 11.9, likely post op inflammation. CBC and BMP ordered for tomorrow. Foley to be removed today. BP 103.50, continue IVF until patient is tolerating a po diet.  DVT Prophylaxis - Aspirin, Lovenox and Plavix Weight-Bearing as tolerated to left leg  J. Cameron Proud, PA-C [facility] Orthopaedic Surgery 10/07/2016, 7:50 AM

## 2016-10-08 LAB — BASIC METABOLIC PANEL
Anion gap: 5 (ref 5–15)
BUN: 10 mg/dL (ref 6–20)
CHLORIDE: 106 mmol/L (ref 101–111)
CO2: 28 mmol/L (ref 22–32)
Calcium: 8.7 mg/dL — ABNORMAL LOW (ref 8.9–10.3)
Creatinine, Ser: 0.96 mg/dL (ref 0.44–1.00)
GFR calc Af Amer: >60 mL/min (ref >60)
GFR calc non Af Amer: >60 mL/min (ref >60)
GLUCOSE: 105 mg/dL — AB (ref 65–99)
POTASSIUM: 3.7 mmol/L (ref 3.5–5.1)
Sodium: 139 mmol/L (ref 135–145)

## 2016-10-08 LAB — CBC
HEMATOCRIT: 29.1 % — AB (ref 35.0–47.0)
HEMOGLOBIN: 10.3 g/dL — AB (ref 12.0–16.0)
MCH: 32.8 pg (ref 26.0–34.0)
MCHC: 35.3 g/dL (ref 32.0–36.0)
MCV: 93.1 fL (ref 80.0–100.0)
Platelets: 231 10*3/uL (ref 150–440)
RBC: 3.13 MIL/uL — ABNORMAL LOW (ref 3.80–5.20)
RDW: 13 % (ref 11.5–14.5)
WBC: 10.5 10*3/uL (ref 3.6–11.0)

## 2016-10-08 MED ORDER — OXYCODONE HCL 5 MG PO TABS: 5.0000 mg | ORAL_TABLET | ORAL | 0 refills | Status: DC | PRN

## 2016-10-08 MED ORDER — FLEET ENEMA 7-19 GM/118ML RE ENEM: 1.0000 | ENEMA | Freq: Once | RECTAL | Status: DC

## 2016-10-08 NOTE — Progress Notes (Deleted)
PT Cancellation Note  Patient Details Name: [name] MRN: [id number] DOB: [birth year]   Cancelled Treatment:    Reason Eval/Treat Not Completed: Other (comment);Fatigue/lethargy limiting ability to participate (nausea).    Larae Grooms 10/08/2016, 2:16 PM

## 2016-10-08 NOTE — Progress Notes (Addendum)
Subjective: 2 Days Post-Op Procedure(s) (LRB): TOTAL KNEE ARTHROPLASTY (Left) Patient reports pain as mild. Pt reports increased pain last night.  Patient is well, and has had no acute complaints or problems Plan is to go Skilled nursing facility after hospital stay. Negative for chest pain and shortness of breath Fever: 99.2 temp this morning. Gastrointestinal:Negative for nausea and vomiting  Objective: Vital signs in last 24 hours: Temp:  [97.5 F (36.4 C)-99.2 F (37.3 C)] 99.2 F (37.3 C) (11/30 0343) Pulse Rate:  [53-78] 75 (11/30 0343) Resp:  [18-20] 18 (11/30 0343) BP: (80-109)/(36-58) 109/58 (11/30 0343) SpO2:  [90 %-93 %] 90 % (11/30 0343)  Intake/Output from previous day:  Intake/Output Summary (Last 24 hours) at 10/08/16 0732 Last data filed at 10/08/16 0636  Gross per 24 hour  Intake              480 ml  Output             2500 ml  Net            -2020 ml    Intake/Output this shift: No intake/output data recorded.  Labs:  Recent Labs  10/06/16 0648 10/07/16 0530 10/08/16 0354  HGB 12.9 10.5* 10.3*    Recent Labs  10/07/16 0530 10/08/16 0354  WBC 11.9* 10.5  RBC 3.31* 3.13*  HCT 30.7* 29.1*  PLT 220 231    Recent Labs  10/07/16 0530 10/08/16 0354  NA 136 139  K 3.8 3.7  CL 104 106  CO2 27 28  BUN 20 10  CREATININE 0.82 0.96  GLUCOSE 164* 105*  CALCIUM 8.6* 8.7*   No results for input(s): LABPT, INR in the last 72 hours.   EXAM General - Patient is Alert, Appropriate and Oriented Extremity - Neurologically intact ABD soft Intact pulses distally Dorsiflexion/Plantar flexion intact Incision: scant drainage No cellulitis present Dressing/Incision - Mild bloody drainage to the left knee, no erythema. Motor Function - intact, moving foot and toes well on exam.   Abdomen slightly distended on exam, normal bowel sounds with mild tympany.  Past Medical History:  Diagnosis Date  . Anxiety   . Arthritis   . CAD (coronary artery  disease)    a. 03/2013 NSTEMI/Cath: 100 RCA, EF 45%->Med Rx;  b. 12/2013 Cath/PCI: LAD 31m, 30d, D1 80, D2 60, LCX nl, RCA 50ost/p, 95m, 95d (2.5x33 Xience DES);  c. 05/2014 Lexi MV: EF 68%, no ischemia; d. stress echo 12/2015 no ischemia @ max exercise (did not achieve target)  . Carotid disease, bilateral ([place])    a. 08/2013 Carotid U/S: 1-39% bilat ICA stenosis.  . Complication of anesthesia   . CVA (cerebral vascular accident) ([place])    a. 08/2013.  [name]   . Depression   . GERD (gastroesophageal reflux disease)   . History of recurrent UTIs   . Hypercholesteremia   . Hyperlipemia   . Hypertension   . Insomnia   . Ischemic cardiomyopathy    a. 03/2013 Echo: EF 30-35%, mod dil LA, mod-sev MR, mod TR;  b. 08/2013 Echo EF 60-65%, mild AI.  Marland Kitchen Menopause   . Migraines   . Myocardial infarction   . PONV (postoperative nausea and vomiting)    If anesthsia is given slowly, pt will not throw up, if given quickly, pt will have nausea and vomiting.  . Right lower quadrant pain   . Syncope and collapse   . Tobacco abuse   . Vaginal atrophy  Assessment/Plan: 2 Days Post-Op Procedure(s) (LRB): TOTAL KNEE ARTHROPLASTY (Left) Active Problems:   Status post total knee replacement using cement, left  Estimated body mass index is 25.61 kg/m as calculated from the following:   Height as of this encounter: 5\' 2"  (1.575 m).   Weight as of this encounter: 63.5 kg (140 lb). Up with therapy   Mild bowel distention, suppositories as needed.  Pt needs to have BM prior to discharge, FLEET enema ordered prn. Labs reviewed, K+ 3.7 Temp 99.2 this AM, WBC 10.5, denies any SOB or urinary symptoms.  Continue to monitor. CBC and BMP ordered for tomorrow. Plan will be for discharge to SNF tomorrow.  DVT Prophylaxis - Aspirin, Lovenox and Plavix Weight-Bearing as tolerated to left leg  J. Cameron Proud, PA-C [facility] Orthopaedic Surgery 10/08/2016, 7:32 AM   The patient complains  of some nausea which began last night buttock and has continued this morning. She has not yet thrown up but does not feel like she has much of an appetite. By my examination this morning, the patient's abdomen appeared be somewhat distended and tympanitic, consistent with an early ileus. Therefore, I have asked the nurse to give the patient weighs to collect suppository, possibly to be followed by a Fleet enema. The patient also was advised to try to avoid eating too much. Therefore, she will be held until tomorrow before being transferred to rehabilitation, assuming her abdominal examination improves and she has a bowel movement.  Pascal Lux, MD

## 2016-10-08 NOTE — Progress Notes (Deleted)
Physical Therapy Treatment Patient Details Name: [name] MRN: [id number] DOB: 06/06/1956 Today's Date: 10/08/2016    History of Present Illness Pt underwent L TKR without reported post-op complications. Pt is POD#0 at time of initatl evaluation    PT Comments    Pt with complaints of increased left knee pain (9/10), nausea and fatigue. Pt very lethargic; little to no breakfast eaten. Pt agreeable to limited exercises in the bed. Pt noted to have decreased left knee range today secondary to the pain. Plan to see pt this afternoon for hopeful progression of range, strength and out of bed activities to improve all areas and functional mobility.   Follow Up Recommendations  SNF     Equipment Recommendations  Rolling walker with 5" wheels    Recommendations for Other Services       Precautions / Restrictions Precautions Precautions: Knee Restrictions Weight Bearing Restrictions: Yes LLE Weight Bearing: Weight bearing as tolerated    Mobility  Bed Mobility Overal bed mobility: Needs Assistance Bed Mobility: Supine to Sit     Supine to sit: Min guard     General bed mobility comments: Pt cautious; moves slowly, Min guard for LLE  Transfers Overall transfer level: Needs assistance Equipment used: Rolling walker (2 wheeled) Transfers: Sit to/from Stand Sit to Stand: Min guard         General transfer comment: Pt does not use LLE today from bed or to sit on commode due to increased pain  Ambulation/Gait Ambulation/Gait assistance: Min guard Ambulation Distance (Feet): 12 Feet Assistive device: Rolling walker (2 wheeled) Gait Pattern/deviations:  (NWB on L due to increased pain) Gait velocity: Decreased Gait velocity interpretation: Below normal speed for age/gender General Gait Details: Pt holds LLE in hip/knee partially flexed position and avoids weightbearing   Stairs            Wheelchair Mobility    Modified Rankin (Stroke Patients Only)        Balance     Sitting balance-Leahy Scale: Normal     Standing balance support: Bilateral upper extremity supported Standing balance-Leahy Scale: Fair                      Cognition Arousal/Alertness: Lethargic Behavior During Therapy: WFL for tasks assessed/performed Overall Cognitive Status: Within Functional Limits for tasks assessed                      Exercises Total Joint Exercises Ankle Circles/Pumps: AROM;Both;20 reps;Supine Quad Sets: Strengthening;Both;20 reps;Supine Short Arc Quad: AROM;Strengthening;Left;10 reps Heel Slides: AAROM;Left;Supine;10 reps Straight Leg Raises: AAROM;Left;10 reps Knee Flexion: PROM;10 reps (stretch) Goniometric ROM: 0-60 (pain limiting this morn)    General Comments        Pertinent Vitals/Pain Pain Assessment: 0-10 Pain Score: 9  Pain Location: L knee Pain Descriptors / Indicators:  (worse today) Pain Intervention(s): Limited activity within patient's tolerance    Home Living                      Prior Function            PT Goals (current goals can now be found in the care plan section) Progress towards PT goals: Progressing toward goals (slowly)    Frequency    BID      PT Plan Current plan remains appropriate    Co-evaluation             End of Session Equipment Utilized During  Treatment: Gait belt Activity Tolerance: Patient limited by lethargy;Patient limited by pain;Other (comment) (nausea) Patient left: Other (comment) (on commode; nursing notified)     Time: [id number] PT Time Calculation (min) (ACUTE ONLY): 23 min  Charges:  $Gait Training: 8-22 mins $Therapeutic Activity: 8-22 mins                    G CodesLarae Grooms, PTA 10/08/2016, 2:57 PM

## 2016-10-08 NOTE — Discharge Instructions (Signed)

## 2016-10-08 NOTE — Progress Notes (Addendum)
Physical Therapy Treatment Patient Details Name: [name] MRN: [id number] DOB: 1955/11/18 Today's Date: 10/08/2016    History of Present Illness Pt underwent L TKR without reported post-op complications. Pt is POD#0 at time of initatl evaluation    PT Comments    Pt with complaints of increased left knee pain (9/10), nausea and fatigue. Pt very lethargic; little to no breakfast eaten. Pt agreeable to limited exercises in the bed. Pt noted to have decreased left knee range today secondary to the pain. Plan to see pt this afternoon for hopeful progression of range, strength and out of bed activities to improve all areas and functional mobility.   Follow Up Recommendations  SNF     Equipment Recommendations  Rolling walker with 5" wheels    Recommendations for Other Services       Precautions / Restrictions Precautions Precautions: Knee Restrictions Weight Bearing Restrictions: Yes LLE Weight Bearing: Weight bearing as tolerated    Mobility  Bed Mobility Overal bed mobility: Needs Assistance Bed Mobility: Supine to Sit     Supine to sit: Min guard     General bed mobility comments: Pt cautious; moves slowly, Min guard for LLE  Transfers Overall transfer level: Needs assistance Equipment used: Rolling walker (2 wheeled) Transfers: Sit to/from Stand Sit to Stand: Min guard         General transfer comment: Pt does not use LLE today from bed or to sit on commode due to increased pain  Ambulation/Gait Ambulation/Gait assistance: Min guard Ambulation Distance (Feet): 12 Feet Assistive device: Rolling walker (2 wheeled) Gait Pattern/deviations:  (NWB on L due to increased pain) Gait velocity: Decreased Gait velocity interpretation: Below normal speed for age/gender General Gait Details: Pt holds LLE in hip/knee partially flexed position and avoids weightbearing   Stairs            Wheelchair Mobility    Modified Rankin (Stroke Patients Only)        Balance     Sitting balance-Leahy Scale: Normal     Standing balance support: Bilateral upper extremity supported Standing balance-Leahy Scale: Fair                      Cognition Arousal/Alertness: Lethargic Behavior During Therapy: WFL for tasks assessed/performed Overall Cognitive Status: Within Functional Limits for tasks assessed                      Exercises     General Comments        Pertinent Vitals/Pain Pain Assessment: 0-10 Pain Score: 9  Pain Location: L knee Pain Descriptors / Indicators:  (worse today) Pain Intervention(s): Limited activity within patient's tolerance    Home Living                      Prior Function            PT Goals (current goals can now be found in the care plan section) Progress towards PT goals: Progressing toward goals (slowly)    Frequency    BID      PT Plan Current plan remains appropriate    Co-evaluation             End of Session Equipment Utilized During Treatment: Gait belt Activity Tolerance: Patient limited by lethargy;Patient limited by pain;Other (comment) (nausea) Patient left: Other (comment) (on commode; nursing notified)     Time: 10:02 - 10:14 PT Time Calculation (min) (ACUTE ONLY):  23 min  Charges:  $Gait Training: 8-22 mins $Therapeutic Activity: 8-22 mins                    G Codes:      Larae Grooms, PTA 10/08/2016, 2:47 PM

## 2016-10-08 NOTE — Progress Notes (Addendum)
Physical Therapy Treatment Patient Details Name: [name] MRN: [id number] DOB: 1956-07-10 Today's Date: 10/08/2016    History of Present Illness Pt underwent L TKR without reported post-op complications. Pt is POD#0 at time of initatl evaluation    PT Comments    Pt sleeping; family in room. Pt awakens fairly easily. initially refused out of bed PT due to increased pain, nausea and fatigue today. Pt currently in CPM machine with flexion reaching 67 degrees. Contacted nursing for approval to increase to 75 degrees in an attempt to maintain as much range as possible ( pt had 90 degrees yesterday). Nursing agrees. Once set, therapist awaits to ensure tolerance of new setting and begins cancellation note. Pt then requests to use the bathroom. Pt assisted up in bed and to commode. Pt is noted to perform transfers and ambulation non weightbearing on the left due to increased pain. Pt needs time for bowel movement then wishes to return to bed/CPM. No further treatment attempted. Nursing notified of pt in the bathroom; pt will use call button when finished. Continue PT to progress range, strength and all functional mobility.    Follow Up Recommendations  SNF     Equipment Recommendations  Rolling walker with 5" wheels    Recommendations for Other Services       Precautions / Restrictions Precautions Precautions: Knee Restrictions Weight Bearing Restrictions: Yes LLE Weight Bearing: Weight bearing as tolerated    Mobility  Bed Mobility Overal bed mobility: Needs Assistance Bed Mobility: Supine to Sit     Supine to sit: Min guard     General bed mobility comments: Pt cautious; moves slowly, Min guard for LLE  Transfers Overall transfer level: Needs assistance Equipment used: Rolling walker (2 wheeled) Transfers: Sit to/from Stand Sit to Stand: Min guard         General transfer comment: Pt does not use LLE today from bed or to sit on commode due to increased  pain  Ambulation/Gait Ambulation/Gait assistance: Min guard Ambulation Distance (Feet): 12 Feet Assistive device: Rolling walker (2 wheeled) Gait Pattern/deviations:  (NWB on L due to increased pain) Gait velocity: Decreased Gait velocity interpretation: Below normal speed for age/gender General Gait Details: Pt holds LLE in hip/knee partially flexed position and avoids weightbearing   Stairs            Wheelchair Mobility    Modified Rankin (Stroke Patients Only)       Balance     Sitting balance-Leahy Scale: Normal     Standing balance support: Bilateral upper extremity supported Standing balance-Leahy Scale: Fair                      Cognition Arousal/Alertness: Lethargic Behavior During Therapy: WFL for tasks assessed/performed Overall Cognitive Status: Within Functional Limits for tasks assessed                      Exercises Total Joint Exercises Ankle Circles/Pumps: AROM;Both;20 reps;Supine Quad Sets: Strengthening;Both;20 reps;Supine Short Arc Quad: AROM;Strengthening;Left;10 reps Heel Slides: AAROM;Left;Supine;10 reps Straight Leg Raises: AAROM;Left;10 reps Knee Flexion: PROM;10 reps (stretch) Goniometric ROM: 0-60 (pain limiting this morn)    General Comments        Pertinent Vitals/Pain Pain Assessment: 0-10 Pain Score: 9  Pain Location: L knee Pain Descriptors / Indicators:  (worse today) Pain Intervention(s): Limited activity within patient's tolerance    Home Living  Prior Function            PT Goals (current goals can now be found in the care plan section) Progress towards PT goals: Progressing toward goals (slowly)    Frequency    BID      PT Plan Current plan remains appropriate    Co-evaluation             End of Session Equipment Utilized During Treatment: Gait belt Activity Tolerance: Patient limited by lethargy;Patient limited by pain;Other (comment)  (nausea) Patient left: Other (comment) (on commode; nursing notified)     Time: [id number] PT Time Calculation (min) (ACUTE ONLY): 23 min  Charges:  $Gait Training: 8-22 mins $Therapeutic Activity: 8-22 mins                    G CodesLarae Grooms, PTA 10/08/2016, 2:38 PM

## 2016-10-08 NOTE — Progress Notes (Signed)
Shift assessment completed at 0800. Pt resting in bed, alert and oriented, on room air, lungs clear bilat. Pt rating pain at 8/10 to her l knee. l knee has polar care intact and ace wrap, pt is ableto move her foot and wiggle her toes. Pt is oob to commode to void prn with assist, urine is clear yellow, ppp. PIV #20 intact to lac, site is free of redness and swelling. Since assessment, pt has consisitently rated her pain at a high level despite meds, pt finally receiving dialudid ivp for pain this afternoon. Dr. Roland Rack rounded on pt, and pt received received suppository at approx 1100, with two bm's resulting. Pt placedin cpm at 1300, pt complained odf discomfort with the cpm consistently, necessitating dialudid ivp. When last checked, pt had her leg out of the cpm with visitor at bedside, pt could not tolerate the movement. Pt has also complained of nausea all day and received medication that on recheck was ineffective, but pt has been found eating fast food brought in by visitors with no apparent ill effects, no actual emesis.

## 2016-10-08 NOTE — Progress Notes (Deleted)
Physical Therapy Treatment Patient Details Name: [name] MRN: [id number] DOB: 26-May-1956 Today's Date: 10/08/2016    History of Present Illness Pt underwent L TKR without reported post-op complications. Pt is POD#0 at time of initatl evaluation    PT Comments    Pt with complaints of increased left knee pain (9/10), nausea and fatigue. Pt very lethargic; little to no breakfast eaten. Pt agreeable to limited exercises in the bed. Pt noted to have decreased left knee range today secondary to the pain. Plan to see pt this afternoon for hopeful progression of range, strength and out of bed activities to improve all areas and functional mobility.   Follow Up Recommendations  SNF     Equipment Recommendations  Rolling walker with 5" wheels    Recommendations for Other Services       Precautions / Restrictions Precautions Precautions: Knee Restrictions Weight Bearing Restrictions: Yes LLE Weight Bearing: Weight bearing as tolerated    Mobility  Bed Mobility               General bed mobility comments: Not tested; pt complaining of too much pain and nausea. Very lethargic  Transfers                    Ambulation/Gait                 Stairs            Wheelchair Mobility    Modified Rankin (Stroke Patients Only)       Balance                                    Cognition Arousal/Alertness: Lethargic Behavior During Therapy: WFL for tasks assessed/performed Overall Cognitive Status: Within Functional Limits for tasks assessed                      Exercises Total Joint Exercises Ankle Circles/Pumps: AROM;Both;20 reps;Supine Quad Sets: Strengthening;Both;20 reps;Supine Short Arc Quad: AROM;Strengthening;Left;10 reps Heel Slides: AAROM;Left;Supine;10 reps Straight Leg Raises: AAROM;Left;10 reps Knee Flexion: PROM;10 reps (stretch) Goniometric ROM: 0-60 (pain limiting this morn)    General Comments         Pertinent Vitals/Pain Pain Assessment: 0-10 Pain Score: 9  Pain Location: L knee Pain Descriptors / Indicators:  (worse today) Pain Intervention(s): Limited activity within patient's tolerance;Ice applied    Home Living                      Prior Function            PT Goals (current goals can now be found in the care plan section) Progress towards PT goals: Not progressing toward goals - comment    Frequency    BID      PT Plan Current plan remains appropriate    Co-evaluation             End of Session   Activity Tolerance: Patient limited by fatigue;Patient limited by pain;Other (comment) (nausea) Patient left: in bed;with call bell/phone within reach;Other (comment) (polar care in place)     Time: [id number] PT Time Calculation (min) (ACUTE ONLY): 12 min  Charges:                       G CodesLarae Grooms, PTA 10/08/2016, 12:48 PM

## 2016-10-08 NOTE — Progress Notes (Signed)
Per MD patient will D/C to [facility] tomorrow if stable. [name] liaision is aware of above. Clinical Social Worker (CSW) will continue to follow and assist as needed.   [employer], LCSW [phone]

## 2016-10-09 ENCOUNTER — Inpatient Hospital Stay: Payer: PPO

## 2016-10-09 DIAGNOSIS — R918 Other nonspecific abnormal finding of lung field: Secondary | ICD-10-CM | POA: Diagnosis not present

## 2016-10-09 LAB — BASIC METABOLIC PANEL
Anion gap: 7 (ref 5–15)
BUN: 8 mg/dL (ref 6–20)
CHLORIDE: 99 mmol/L — AB (ref 101–111)
CO2: 28 mmol/L (ref 22–32)
Calcium: 9.2 mg/dL (ref 8.9–10.3)
Creatinine, Ser: 0.91 mg/dL (ref 0.44–1.00)
GFR calc Af Amer: >60 mL/min (ref >60)
GFR calc non Af Amer: >60 mL/min (ref >60)
GLUCOSE: 123 mg/dL — AB (ref 65–99)
POTASSIUM: 3.7 mmol/L (ref 3.5–5.1)
Sodium: 134 mmol/L — ABNORMAL LOW (ref 135–145)

## 2016-10-09 LAB — URINALYSIS COMPLETE WITH MICROSCOPIC (ARMC ONLY)
Bacteria, UA: NONE SEEN
Bilirubin Urine: NEGATIVE
Glucose, UA: NEGATIVE mg/dL
Hgb urine dipstick: NEGATIVE
Ketones, ur: NEGATIVE mg/dL
Leukocytes, UA: NEGATIVE
Nitrite: NEGATIVE
PROTEIN: NEGATIVE mg/dL
SQUAMOUS EPITHELIAL / LPF: NONE SEEN
Specific Gravity, Urine: 1.006 (ref 1.005–1.030)
pH: 8 (ref 5.0–8.0)

## 2016-10-09 NOTE — Progress Notes (Signed)
Physical Therapy Treatment Patient Details Name: [name] MRN: [id number] DOB: 12-29-1955 Today's Date: 10/09/2016    History of Present Illness Pt underwent L TKR without reported post-op complications. Pt is POD#0 at time of initatl evaluation    PT Comments    Pt reports severe 10/10 L knee pain but has declined pain medicine from RN multiple times today.  She requests pain medicine at start of session and RN notified who brought it to her room during session.  Pt was very limited due to her pain.  Pt performs therapeutic exercise very slowly and cautiously due to pain, limiting the amount of exercises that can be completed. Of note at start of session pt lets PT know she needs to void and was incontinent on the way to the bathroom.  When asked if she was incontinent prior to surgery she says, "No it started after the surgery" and confirms that she can tell she is voiding but that she is unable to hold it.  RN notified of this reported change.  SNF remains most appropriate d/c plan.  Pt will benefit from continued skilled PT services to increase functional independence and safety.   Follow Up Recommendations  SNF     Equipment Recommendations  Rolling walker with 5" wheels    Recommendations for Other Services       Precautions / Restrictions Precautions Precautions: Knee Precaution Comments: Reminded pt of maintaining L knee extension when seated or supine Restrictions Weight Bearing Restrictions: Yes LLE Weight Bearing: Weight bearing as tolerated    Mobility  Bed Mobility Overal bed mobility: Needs Assistance Bed Mobility: Sit to Supine;Supine to Sit     Supine to sit: Min guard;HOB elevated Sit to supine: Min guard   General bed mobility comments: Pt requires increased time and cues for sequencing but no physical assist.  She uses her LUE to assist her LLE into and out of bed.  Transfers Overall transfer level: Needs assistance Equipment used: Rolling walker (2  wheeled) Transfers: Sit to/from Stand Sit to Stand: Min guard         General transfer comment: Very slow to stand but with proper technique.  Pt pushes through RLE with minimum WBing through LLE.  Ambulation/Gait Ambulation/Gait assistance: Min guard Ambulation Distance (Feet): 80 Feet Assistive device: Rolling walker (2 wheeled) Gait Pattern/deviations: Step-to pattern;Decreased stride length;Decreased weight shift to left;Decreased stance time - left;Decreased step length - right;Antalgic;Trunk flexed Gait velocity: Decreased Gait velocity interpretation: Below normal speed for age/gender General Gait Details: Pt relying heavily on support from RW requires cues to allow L foot flat while ambulating as pt initially performing TDWB due to pain.  Pt declines ambulating farther due to pain.   Stairs            Wheelchair Mobility    Modified Rankin (Stroke Patients Only)       Balance Overall balance assessment: Needs assistance Sitting-balance support: No upper extremity supported;Feet supported Sitting balance-Leahy Scale: Good Sitting balance - Comments: pt pushing through UEs to offload LLE   Standing balance support: Bilateral upper extremity supported;During functional activity Standing balance-Leahy Scale: Poor Standing balance comment: Relies on RW for support                    Cognition Arousal/Alertness: Awake/alert Behavior During Therapy: Flat affect Overall Cognitive Status: Within Functional Limits for tasks assessed  Exercises Total Joint Exercises Ankle Circles/Pumps: AROM;Both;10 reps;Seated Quad Sets: Strengthening;Both;Supine;5 reps Straight Leg Raises: Strengthening;Left;AROM;Supine;5 reps Long Arc Quad: Strengthening;Left;5 reps;Seated Knee Flexion: AAROM;Left;Seated;Other (comment) (with 5 second holds into flexion) Goniometric ROM: 0-79, limited due to pain    General Comments General comments (skin  integrity, edema, etc.): At start of session pt lets PT know she needs to void and was incontinent on the way to the bathroom.  When asked if she was incontinent prior to surgery she says, "No it started after the surgery" and confirms that she can tell she is voiding but that she is unable to hold it.  RN notified. Pt performs therapeutic exercise very slowly and cautiously due to pain, limiting the amount of exercises that can be completed.      Pertinent Vitals/Pain Pain Assessment: 0-10 Pain Score: 10-Worst pain ever Pain Location: L knee Pain Descriptors / Indicators: Aching;Constant;Grimacing;Guarding;Moaning Pain Intervention(s): Limited activity within patient's tolerance;Monitored during session;Repositioned;Patient requesting pain meds-RN notified;Ice applied (pt declined premedication from RN)    Home Living                      Prior Function            PT Goals (current goals can now be found in the care plan section) Acute Rehab PT Goals Patient Stated Goal: decreased pain PT Goal Formulation: With patient Time For Goal Achievement: 10/20/16 Potential to Achieve Goals: Good Progress towards PT goals: Not progressing toward goals - comment (due to pain)    Frequency    BID      PT Plan Current plan remains appropriate    Co-evaluation             End of Session Equipment Utilized During Treatment: Gait belt Activity Tolerance: Patient limited by pain Patient left: in bed;with call bell/phone within reach;with bed alarm set;Other (comment) (polar care in place)     Time: 1330-1420 PT Time Calculation (min) (ACUTE ONLY): 50 min  Charges:  $Gait Training: 8-22 mins $Therapeutic Exercise: 23-37 mins                    G Codes:      Collie Siad PT, DPT 10/09/2016, 2:44 PM

## 2016-10-09 NOTE — Progress Notes (Signed)
Subjective: 3 Days Post-Op Procedure(s) (LRB): TOTAL KNEE ARTHROPLASTY (Left) Patient reports pain as mild. Pt reports increased pain last night.  Patient is well, and has had no acute complaints or problems Plan is to go Skilled nursing facility after hospital stay. Negative for chest pain and shortness of breath Fever: 100.4 temp this morning. Gastrointestinal: Positive for mild nausea at this time.  Objective: Vital signs in last 24 hours: Temp:  [97.5 F (36.4 C)-100.4 F (38 C)] 100.4 F (38 C) (12/01 0719) Pulse Rate:  [67-87] 80 (12/01 0719) Resp:  [16-19] 18 (12/01 0719) BP: (85-130)/(51-72) 127/68 (12/01 0719) SpO2:  [91 %-95 %] 91 % (12/01 0719)  Intake/Output from previous day:  Intake/Output Summary (Last 24 hours) at 10/09/16 0744 Last data filed at 10/09/16 0435  Gross per 24 hour  Intake              960 ml  Output              400 ml  Net              560 ml    Intake/Output this shift: No intake/output data recorded.  Labs:  Recent Labs  10/07/16 0530 10/08/16 0354  HGB 10.5* 10.3*    Recent Labs  10/07/16 0530 10/08/16 0354  WBC 11.9* 10.5  RBC 3.31* 3.13*  HCT 30.7* 29.1*  PLT 220 231    Recent Labs  10/08/16 0354 10/09/16 0531  NA 139 134*  K 3.7 3.7  CL 106 99*  CO2 28 28  BUN 10 8  CREATININE 0.96 0.91  GLUCOSE 105* 123*  CALCIUM 8.7* 9.2   No results for input(s): LABPT, INR in the last 72 hours.   EXAM General - Patient is Alert, Appropriate and Oriented Extremity - Neurologically intact ABD soft Intact pulses distally Dorsiflexion/Plantar flexion intact Incision: scant drainage No cellulitis present Dressing/Incision - Mild bloody drainage to the left knee, no erythema. Motor Function - intact, moving foot and toes well on exam.   Abdomen slightly distended on exam, normal bowel sounds with mild tympany.  Past Medical History:  Diagnosis Date  . Anxiety   . Arthritis   . CAD (coronary artery disease)    a.  03/2013 NSTEMI/Cath: 100 RCA, EF 45%->Med Rx;  b. 12/2013 Cath/PCI: LAD 22m, 30d, D1 80, D2 60, LCX nl, RCA 50ost/p, 67m, 95d (2.5x33 Xience DES);  c. 05/2014 Lexi MV: EF 68%, no ischemia; d. stress echo 12/2015 no ischemia @ max exercise (did not achieve target)  . Carotid disease, bilateral ([place])    a. 08/2013 Carotid U/S: 1-39% bilat ICA stenosis.  . Complication of anesthesia   . CVA (cerebral vascular accident) ([place])    a. 08/2013.  [name]   . Depression   . GERD (gastroesophageal reflux disease)   . History of recurrent UTIs   . Hypercholesteremia   . Hyperlipemia   . Hypertension   . Insomnia   . Ischemic cardiomyopathy    a. 03/2013 Echo: EF 30-35%, mod dil LA, mod-sev MR, mod TR;  b. 08/2013 Echo EF 60-65%, mild AI.  Marland Kitchen Menopause   . Migraines   . Myocardial infarction   . PONV (postoperative nausea and vomiting)    If anesthsia is given slowly, pt will not throw up, if given quickly, pt will have nausea and vomiting.  . Right lower quadrant pain   . Syncope and collapse   . Tobacco abuse   . Vaginal atrophy  Assessment/Plan: 3 Days Post-Op Procedure(s) (LRB): TOTAL KNEE ARTHROPLASTY (Left) Active Problems:   Status post total knee replacement using cement, left  Estimated body mass index is 25.61 kg/m as calculated from the following:   Height as of this encounter: 5\' 2"  (1.575 m).   Weight as of this encounter: 63.5 kg (140 lb). Up with therapy   Pt has had a bowel movement following suppositiories yesterday. Labs reviewed, K+ 3.7 Temp 100.4 this AM, WBC 10.5 yesterday.  No signs of acute stroke during neurological examination. Will add UA as well as chest x-ray for examination of fever. Will work with PT today. Will plan for discharge tomorrow pending 24 hours without fever. CBC for tomorrow morning placed.  Orders for blood cultures placed if temp reaches 101 today.  DVT Prophylaxis - Aspirin, Lovenox and Plavix Weight-Bearing as tolerated to left  leg  J. Cameron Proud, PA-C [facility] Orthopaedic Surgery 10/09/2016, 7:44 AM

## 2016-10-09 NOTE — Discharge Summary (Signed)
Physician Discharge Summary  Patient ID: NENA MAXSON MRN: [id number] DOB/AGE: February 04, 1956 60 y.o.  Admit date: 10/06/2016 Discharge date: 10/10/16  Admission Diagnoses:  primary osteoarthritis left knee Left knee degenerative joint disease  Discharge Diagnoses: Patient Active Problem List   Diagnosis Date Noted  . Status post total knee replacement using cement, left 10/06/2016  . History of coronary artery stent placement   . Pure hypercholesterolemia   . Recurrent UTI 02/11/2016  . Menopause 02/11/2016  . Angina pectoris ([place]) 01/07/2016  . Syncope 07/11/2015  . Orthostatic hypotension 02/19/2015  . Dehydration 02/19/2015  . Hypokalemia 02/19/2015  . Stroke ([place]) 02/18/2015  . Tobacco abuse 02/18/2015  . History of stroke 07/18/2014  . Vertigo 07/18/2014  . Bilateral wrist pain 07/18/2014  . Atypical chest pain 06/05/2014  . CAD (coronary artery disease) 01/19/2014  . Carotid arterial disease ([place]) 01/19/2014  . Depression 01/16/2014  . Memory loss 01/16/2014  . Right sided weakness 08/10/2013  . Hypertension 08/10/2013  . Hyperlipidemia 08/10/2013  . History of MI (myocardial infarction) 08/10/2013  . Stroke, acute, embolic ([place]) [id number]  left knee degenerative joint disease.  Past Medical History:  Diagnosis Date  . Anxiety   . Arthritis   . CAD (coronary artery disease)    a. 03/2013 NSTEMI/Cath: 100 RCA, EF 45%->Med Rx;  b. 12/2013 Cath/PCI: LAD 55m, 30d, D1 80, D2 60, LCX nl, RCA 50ost/p, 50m, 95d (2.5x33 Xience DES);  c. 05/2014 Lexi MV: EF 68%, no ischemia; d. stress echo 12/2015 no ischemia @ max exercise (did not achieve target)  . Carotid disease, bilateral ([place])    a. 08/2013 Carotid U/S: 1-39% bilat ICA stenosis.  . Complication of anesthesia   . CVA (cerebral vascular accident) ([place])    a. 08/2013.  [name]   . Depression   . GERD (gastroesophageal reflux disease)   . History of recurrent UTIs   . Hypercholesteremia   . Hyperlipemia    . Hypertension   . Insomnia   . Ischemic cardiomyopathy    a. 03/2013 Echo: EF 30-35%, mod dil LA, mod-sev MR, mod TR;  b. 08/2013 Echo EF 60-65%, mild AI.  Marland Kitchen Menopause   . Migraines   . Myocardial infarction   . PONV (postoperative nausea and vomiting)    If anesthsia is given slowly, pt will not throw up, if given quickly, pt will have nausea and vomiting.  . Right lower quadrant pain   . Syncope and collapse   . Tobacco abuse   . Vaginal atrophy    Transfusion: None   Consultants (if any):   Discharged Condition: Improved  Hospital Course: EDRA BIGGINS is an 60 y.o. female who was admitted 10/06/2016 with a diagnosis of left knee degenerative joint disease and went to the operating room on 10/06/2016 and underwent the above named procedures.   Hospital course complicated by fever on [id number].  100.4 axillary.  UA and CXR obtained, continued to monitor until discharge.   Surgeries: Procedure(s): TOTAL KNEE ARTHROPLASTY on 10/06/2016 Patient tolerated the surgery well. Taken to PACU where she was stabilized and then transferred to the orthopedic floor.  Started on Lovenox 40 q 24 hrs.  As well as continued lovenox 75mg  daily Foot pumps applied bilaterally at 80 mm. Heels elevated on bed with rolled towels. No evidence of DVT. Negative [place]. Physical therapy started on day #1 for gait training and transfer. OT started day #1 for ADL and assisted devices.  Patient's IV and Foley removed on  POD1.  Implants: Left TKA using all-cemented [employer] Vanguard system with a 65 mm PCR femur, a 67 mm tibial tray with a 10 mm E-poly insert, and a 31 x 8 mm all-poly 3-pegged domed patella.  She was given perioperative antibiotics:  Anti-infectives    Start     Dose/Rate Route Frequency Ordered Stop   10/06/16 1300  clindamycin (CLEOCIN) IVPB 900 mg     900 mg 100 mL/hr over 30 Minutes Intravenous Every 6 hours 10/06/16 1147 10/07/16 0006   10/06/16 0604  clindamycin (CLEOCIN) 900 MG/50ML  IVPB    Comments:  HENRY, LOIS: cabinet override      10/06/16 0604 10/06/16 0753   10/05/16 2300  clindamycin (CLEOCIN) IVPB 900 mg     900 mg 100 mL/hr over 30 Minutes Intravenous  Once 10/05/16 2245 10/06/16 0813    .  She was given sequential compression devices, early ambulation, and plavix and lovenox for DVT prophylaxis.  She benefited maximally from the hospital stay and there were no complications.    Recent vital signs:  Vitals:   10/09/16 0426 10/09/16 0719  BP: 130/72 127/68  Pulse: 85 80  Resp: 18 18  Temp: (!) 100.4 F (38 C) (!) 100.4 F (38 C)    Recent laboratory studies:  Lab Results  Component Value Date   HGB 10.3 (L) 10/08/2016   HGB 10.5 (L) 10/07/2016   HGB 12.9 10/06/2016   Lab Results  Component Value Date   WBC 10.5 10/08/2016   PLT 231 10/08/2016   Lab Results  Component Value Date   INR 0.95 09/23/2016   Lab Results  Component Value Date   NA 134 (L) 10/09/2016   K 3.7 10/09/2016   CL 99 (L) 10/09/2016   CO2 28 10/09/2016   BUN 8 10/09/2016   CREATININE 0.91 10/09/2016   GLUCOSE 123 (H) 10/09/2016    Discharge Medications:     Medication List    TAKE these medications   acetaminophen 500 MG tablet Commonly known as:  TYLENOL Take 500 mg by mouth every 6 (six) hours as needed for mild pain or moderate pain.   Alirocumab 75 MG/ML Sopn Commonly known as:  PRALUENT Inject 1 pen into the skin every 14 (fourteen) days.   aspirin 81 MG tablet Take 81 mg by mouth daily.   atorvastatin 80 MG tablet Commonly known as:  LIPITOR Take 80 mg by mouth at bedtime.   clopidogrel 75 MG tablet Commonly known as:  PLAVIX TAKE 1 TABLET BY MOUTH DAILY What changed:  See the new instructions.   DULoxetine 30 MG capsule Commonly known as:  CYMBALTA Take 90 mg by mouth daily.   ezetimibe 10 MG tablet Commonly known as:  ZETIA Take 1 tablet (10 mg total) by mouth daily.   furosemide 20 MG tablet Commonly known as:  LASIX Take 20  mg by mouth daily as needed for fluid or edema.   hydrOXYzine 25 MG tablet Commonly known as:  ATARAX/VISTARIL Take 25mg s twice daily as needed for anxiety   isosorbide mononitrate 60 MG 24 hr tablet Commonly known as:  IMDUR Take 1 tablet (60 mg total) by mouth daily. What changed:  when to take this   metoprolol succinate 25 MG 24 hr tablet Commonly known as:  TOPROL-XL Take 25 mg by mouth every morning.   nitrofurantoin (macrocrystal-monohydrate) 100 MG capsule Commonly known as:  MACROBID Take 1 capsule (100 mg total) by mouth at bedtime.   nitroGLYCERIN 0.4 MG  SL tablet Commonly known as:  NITROSTAT Place 1 tablet (0.4 mg total) under the tongue every 5 (five) minutes as needed for chest pain.   oxyCODONE 5 MG immediate release tablet Commonly known as:  Oxy IR/ROXICODONE Take 1-2 tablets (5-10 mg total) by mouth every 4 (four) hours as needed for breakthrough pain.   pantoprazole 40 MG tablet Commonly known as:  PROTONIX Take 40 mg by mouth at bedtime.   potassium chloride SA 20 MEQ tablet Commonly known as:  K-DUR,KLOR-CON Take 1 tablet (20 mEq total) by mouth 3 (three) times daily.   ranolazine 500 MG 12 hr tablet Commonly known as:  RANEXA Take 1 tablet (500 mg total) by mouth 2 (two) times daily.   sertraline 100 MG tablet Commonly known as:  ZOLOFT Take 200 mg by mouth daily.   triamterene-hydrochlorothiazide 37.5-25 MG capsule Commonly known as:  DYAZIDE TAKE 1 TABLET BY MOUTH DAILY   VITAMIN D PO Take 1 capsule by mouth daily.   zolpidem 10 MG tablet Commonly known as:  AMBIEN Take 10 mg by mouth at bedtime as needed for sleep.            Durable Medical Equipment        Start     Ordered   10/06/16 1148  DME Walker rolling  Once    Question:  Patient needs a walker to treat with the following condition  Answer:  Status post total knee replacement using cement, left   10/06/16 1147   10/06/16 1148  DME 3 n 1  Once     10/06/16 1147    10/06/16 1148  DME Bedside commode  Once    Question:  Patient needs a bedside commode to treat with the following condition  Answer:  Status post total knee replacement using cement, left   10/06/16 1147      Diagnostic Studies: Dg Chest 2 View  Result Date: 09/23/2016 CLINICAL DATA:  Preop respiratory exam for total knee replacement. Previous myocardial infarct and stroke. Hypertension. EXAM: CHEST  2 VIEW COMPARISON:  06/19/2016 FINDINGS: The heart size and mediastinal contours are within normal limits. Both lungs are clear. The visualized skeletal structures are unremarkable. IMPRESSION: No active cardiopulmonary disease. Electronically Signed   By: Earle Gell M.D.   On: 09/23/2016 18:45   Dg Knee Left Port  Result Date: 10/06/2016 CLINICAL DATA:  Postop left knee EXAM: PORTABLE LEFT KNEE - 1-2 VIEW COMPARISON:  MRI 08/06/2016 FINDINGS: Changes of left knee replacement. Soft tissue and joint space gas present. No hardware or bony complicating feature. IMPRESSION: Left knee replacement.  No complicating feature. Electronically Signed   By: Rolm Baptise M.D.   On: 10/06/2016 10:43   Disposition: Plan will be for discharge tomorrow, pending 24 hours without fever.  UA and CXR ordered for today, plan will be for discharge to SNF.    Contact information for follow-up providers    Judson Roch, PA-C Follow up in 14 day(s).   Specialty:  Physician Assistant Why:  Electa Sniff information: [place] [id number] [phone]            Contact information for after-discharge care    Destination    [facility] Follow up.   Specialty:  [place] information: [address] [place] [phone]                 Signed: GABRIALLE DIVITO  PA-C 10/09/2016, 7:51 AM

## 2016-10-09 NOTE — Progress Notes (Signed)
Physical Therapy Treatment Patient Details Name: [name] MRN: [id number] DOB: Sep 05, 1956 Today's Date: 10/09/2016    History of Present Illness Pt underwent L TKR without reported post-op complications. Pt is POD#0 at time of initatl evaluation    PT Comments    Pt has had a setback in mobility since her initial PT evaluation due to pain that causes nausea and dizziness with mobility as pt reports it as a 10/10.  As a result she performs therapeutic exercises with increased time and is very guarded.  She ambulated 15 ft using RW with heavy reliance on RW to offload LLE with minimal WBing on LLE.  SNF remains most appropriate d/c plan.  Pt will benefit from continued skilled PT services to increase functional independence and safety.   Follow Up Recommendations  SNF     Equipment Recommendations  Rolling walker with 5" wheels    Recommendations for Other Services       Precautions / Restrictions Precautions Precautions: Knee Precaution Comments: Reminded pt of maintaining L knee extension when seated or supine Restrictions Weight Bearing Restrictions: Yes LLE Weight Bearing: Weight bearing as tolerated    Mobility  Bed Mobility Overal bed mobility: Needs Assistance Bed Mobility: Sit to Supine     Supine to sit: Min guard     General bed mobility comments: Pt requires increased time and cues for sequencing but no physical assist.  Transfers Overall transfer level: Needs assistance Equipment used: Rolling walker (2 wheeled) Transfers: Sit to/from Stand Sit to Stand: Min guard         General transfer comment: Very slow to stand but with proper technique.  Pt pushes through RLE with minimum WBing through LLE.  Ambulation/Gait Ambulation/Gait assistance: Min guard Ambulation Distance (Feet): 15 Feet Assistive device: Rolling walker (2 wheeled) Gait Pattern/deviations: Step-to pattern;Decreased stride length;Decreased stance time - left;Decreased weight shift  to left;Decreased step length - right;Antalgic Gait velocity: Decreased Gait velocity interpretation: <1.8 ft/sec, indicative of risk for recurrent falls General Gait Details: Pt relying heavily on support from RW and minimally weight shifts to LLE with step to gait pattern.  Cues for upright posture.  Pt reports feeling slightly dizzy from the pain while ambulating, close min guard assist for safety.  She declined ambulating in hallway and required max encouragement to ambulate in her room.   Stairs            Wheelchair Mobility    Modified Rankin (Stroke Patients Only)       Balance Overall balance assessment: Needs assistance Sitting-balance support: No upper extremity supported;Feet supported Sitting balance-Leahy Scale: Good Sitting balance - Comments: pt pushing through UEs to offload LLE   Standing balance support: Bilateral upper extremity supported;During functional activity Standing balance-Leahy Scale: Poor Standing balance comment: Relies on RW for support                    Cognition Arousal/Alertness: Awake/alert Behavior During Therapy: WFL for tasks assessed/performed Overall Cognitive Status: Within Functional Limits for tasks assessed                      Exercises Total Joint Exercises Ankle Circles/Pumps: AROM;Both;10 reps;Seated Quad Sets: Strengthening;Both;10 reps;Seated Straight Leg Raises: AAROM;Strengthening;AROM;Left;10 reps;Seated Long Arc Quad: Strengthening;Left;5 reps;Seated Knee Flexion: AAROM;Left;Seated;Other (comment) (7 reps, with 5 second holds into flexion) Goniometric ROM: 0-80, limited by pain     General Comments General comments (skin integrity, edema, etc.): Pt performs therapeutic exercise very slowly and  cautiously due to pain, reporting nausea when she works as hard as she can      Pertinent Vitals/Pain Pain Assessment: 0-10 Pain Score: 10-Worst pain ever Pain Location: L knee Pain Descriptors /  Indicators: Aching;Constant;Grimacing;Guarding;Discomfort Pain Intervention(s): Limited activity within patient's tolerance;Monitored during session;Repositioned;Ice applied (pt declined predication from RN)    Home Living                      Prior Function            PT Goals (current goals can now be found in the care plan section) Acute Rehab PT Goals Patient Stated Goal: decreased pain PT Goal Formulation: With patient Time For Goal Achievement: 10/20/16 Potential to Achieve Goals: Good Progress towards PT goals: Not progressing toward goals - comment (due to pain)    Frequency    BID      PT Plan Current plan remains appropriate    Co-evaluation             End of Session Equipment Utilized During Treatment: Gait belt Activity Tolerance: Patient limited by lethargy;Patient limited by pain;Other (comment) (nausea) Patient left: in bed;with call bell/phone within reach;with bed alarm set     Time: 1017-1050 PT Time Calculation (min) (ACUTE ONLY): 33 min  Charges:  $Therapeutic Exercise: 23-37 mins                    G Codes:      Collie Siad PT, DPT 10/09/2016, 11:04 AM

## 2016-10-09 NOTE — Progress Notes (Signed)
Patient alert and oriented. Lung sounds clear and heart sounds normal. Patient has ace bandage around left knee with no drainage. Patient also has knee immobilizer on. RN helped patient to the [facility]. Patient 1 assist with walker. RN offered pain medicine and patient declined. Patient had a low grade temperature last night. As of now she has a axillary temperature of 97.7.   Deri Fuelling, RN

## 2016-10-09 NOTE — Progress Notes (Signed)
Plan is for patient to D/C tomorrow to [facility] room 704 if stable. Health Team authorization has been received. Auth # [id number]. Clinical Education officer, museum (CSW) sent D/C orders today to [facility] via [facility]. [name] liaison is aware of above. Patient is aware of above.  [employer], LCSW [phone]

## 2016-10-09 NOTE — Progress Notes (Signed)
Patient refused to use CPM machine.   Deri Fuelling, RN

## 2016-10-10 DIAGNOSIS — I10 Essential (primary) hypertension: Secondary | ICD-10-CM | POA: Diagnosis not present

## 2016-10-10 DIAGNOSIS — E876 Hypokalemia: Secondary | ICD-10-CM | POA: Diagnosis not present

## 2016-10-10 DIAGNOSIS — R262 Difficulty in walking, not elsewhere classified: Secondary | ICD-10-CM | POA: Diagnosis not present

## 2016-10-10 DIAGNOSIS — M25562 Pain in left knee: Secondary | ICD-10-CM | POA: Diagnosis not present

## 2016-10-10 DIAGNOSIS — M1712 Unilateral primary osteoarthritis, left knee: Secondary | ICD-10-CM | POA: Diagnosis not present

## 2016-10-10 DIAGNOSIS — E785 Hyperlipidemia, unspecified: Secondary | ICD-10-CM | POA: Diagnosis not present

## 2016-10-10 DIAGNOSIS — F411 Generalized anxiety disorder: Secondary | ICD-10-CM | POA: Diagnosis not present

## 2016-10-10 DIAGNOSIS — Z5189 Encounter for other specified aftercare: Secondary | ICD-10-CM | POA: Diagnosis not present

## 2016-10-10 DIAGNOSIS — F329 Major depressive disorder, single episode, unspecified: Secondary | ICD-10-CM | POA: Diagnosis not present

## 2016-10-10 DIAGNOSIS — K219 Gastro-esophageal reflux disease without esophagitis: Secondary | ICD-10-CM | POA: Diagnosis not present

## 2016-10-10 DIAGNOSIS — I2510 Atherosclerotic heart disease of native coronary artery without angina pectoris: Secondary | ICD-10-CM | POA: Diagnosis not present

## 2016-10-10 DIAGNOSIS — Z7401 Bed confinement status: Secondary | ICD-10-CM | POA: Diagnosis not present

## 2016-10-10 DIAGNOSIS — M6281 Muscle weakness (generalized): Secondary | ICD-10-CM | POA: Diagnosis not present

## 2016-10-10 LAB — CBC
HEMATOCRIT: 31.7 % — AB (ref 35.0–47.0)
HEMOGLOBIN: 10.9 g/dL — AB (ref 12.0–16.0)
MCH: 31.3 pg (ref 26.0–34.0)
MCHC: 34.2 g/dL (ref 32.0–36.0)
MCV: 91.4 fL (ref 80.0–100.0)
Platelets: 256 10*3/uL (ref 150–440)
RBC: 3.47 MIL/uL — AB (ref 3.80–5.20)
RDW: 12.7 % (ref 11.5–14.5)
WBC: 13.1 10*3/uL — ABNORMAL HIGH (ref 3.6–11.0)

## 2016-10-10 MED ORDER — LACTULOSE 10 GM/15ML PO SOLN: 10.0000 g | Freq: Two times a day (BID) | ORAL | Status: DC | PRN

## 2016-10-10 NOTE — Progress Notes (Signed)
Patient asked to get off CPM refusing to wear it anymore

## 2016-10-10 NOTE — Progress Notes (Signed)
RN called report to RN at [facility]. All questions answered. Patient alert and oriented. No pain at this time. Patient has not had a fever today. EMS is called for pick up.   Deri Fuelling, RN

## 2016-10-10 NOTE — Progress Notes (Signed)
Physical Therapy Treatment Patient Details Name: [name] MRN: [id number] DOB: [birth year] Today's Date: 10/10/2016    History of Present Illness Pt underwent L TKR without reported post-op complications. Pt is POD#0 at time of initatl evaluation    PT Comments    Participated in exercises as described below.  Pt self limiting AAOM today due to pain.  Educated on importance of ROM and increasing ROM daily. She voiced understanding.  Ambulated with slow but steady gait in hallway.  She was able to ambulate in/out of bathroom upon return to void.  Overall mobility continues to improve.  Primary barrier is pain control.  Anticipate discharge to rehab today.   Follow Up Recommendations  SNF     Equipment Recommendations  Rolling walker with 5" wheels    Recommendations for Other Services       Precautions / Restrictions Precautions Precautions: Knee Knee Immobilizer - Left: Discontinue once straight leg raise with < 10 degree lag;Other (comment) (Did not use today for gait ) Restrictions Weight Bearing Restrictions: Yes LLE Weight Bearing: Weight bearing as tolerated    Mobility  Bed Mobility Overal bed mobility: Modified Independent Bed Mobility: Sit to Supine;Supine to Sit     Supine to sit: Modified independent (Device/Increase time) Sit to supine: Modified independent (Device/Increase time)   General bed mobility comments: increased time  Transfers Overall transfer level: Needs assistance Equipment used: Rolling walker (2 wheeled) Transfers: Sit to/from Stand Sit to Stand: Min guard         General transfer comment: slow but good safety  Ambulation/Gait Ambulation/Gait assistance: Min guard Ambulation Distance (Feet): 100 Feet Assistive device: Rolling walker (2 wheeled) Gait Pattern/deviations: Step-to pattern;Decreased stance time - left Gait velocity: Decreased Gait velocity interpretation: Below normal speed for age/gender General Gait Details:  heavy support om walker, slow guarded gait   Stairs            Wheelchair Mobility    Modified Rankin (Stroke Patients Only)       Balance Overall balance assessment: Needs assistance Sitting-balance support: Feet supported Sitting balance-Leahy Scale: Good     Standing balance support: Bilateral upper extremity supported Standing balance-Leahy Scale: Fair                      Cognition Arousal/Alertness: Awake/alert Behavior During Therapy: Flat affect Overall Cognitive Status: Within Functional Limits for tasks assessed                      Exercises Total Joint Exercises Ankle Circles/Pumps: AROM;Both;10 reps;Seated Quad Sets: Strengthening;Both;Supine;5 reps Gluteal Sets: Strengthening;Both;10 reps;Supine Heel Slides: AAROM;Left;Supine;10 reps Straight Leg Raises: AROM;Left;10 reps;Supine (assist to initiate but once started able to do independantly) Long Arc Quad: AAROM;Left;10 reps;Seated Knee Flexion: AAROM;Left;Seated;Other (comment) Goniometric ROM: 0-55 (guarded motion today - self limiting) Other Exercises Other Exercises: to bathroom    General Comments        Pertinent Vitals/Pain Pain Assessment: 0-10 Pain Score: 8  Pain Location: L knee Pain Descriptors / Indicators: Aching;Constant;Operative site guarding Pain Intervention(s): Limited activity within patient's tolerance;Ice applied;Premedicated before session    Home Living                      Prior Function            PT Goals (current goals can now be found in the care plan section) Progress towards PT goals: Progressing toward goals    Frequency  BID      PT Plan Current plan remains appropriate    Co-evaluation             End of Session Equipment Utilized During Treatment: Gait belt Activity Tolerance: Patient limited by pain Patient left: in bed;with call bell/phone within reach;with bed alarm set;Other (comment)     Time:  [id number] PT Time Calculation (min) (ACUTE ONLY): 28 min  Charges:  $Gait Training: 8-22 mins $Therapeutic Exercise: 8-22 mins                    G Codes:      Chesley Noon 10/17/16, 10:46 AM

## 2016-10-10 NOTE — Progress Notes (Signed)
Subjective: 4 Days Post-Op Procedure(s) (LRB): TOTAL KNEE ARTHROPLASTY (Left) Patient reports pain as mild.   Patient is well, and has had no acute complaints or problems Continue with physical therapy today.  Plan is to go Rehab after hospital stay. no nausea and no vomiting Patient denies any chest pains or shortness of breath. Objective: Vital signs in last 24 hours: Temp:  [97.7 F (36.5 C)-99.1 F (37.3 C)] 99.1 F (37.3 C) (12/02 0409) Pulse Rate:  [86-88] 86 (12/02 0409) Resp:  [16-18] 18 (12/02 0409) BP: (110-118)/(61-64) 118/64 (12/02 0409) SpO2:  [90 %-98 %] 91 % (12/02 0409) well approximated incision Heels are non tender and elevated off the bed using rolled towels No chest congestion No chest pain or sob No problem with urination No c/o's. States she is doing fine  Intake/Output from previous day: 12/01 0701 - 12/02 0700 In: 240 [P.O.:240] Out: -  Intake/Output this shift: No intake/output data recorded.   Recent Labs  10/08/16 0354 10/10/16 0356  HGB 10.3* 10.9*    Recent Labs  10/08/16 0354 10/10/16 0356  WBC 10.5 13.1*  RBC 3.13* 3.47*  HCT 29.1* 31.7*  PLT 231 256    Recent Labs  10/08/16 0354 10/09/16 0531  NA 139 134*  K 3.7 3.7  CL 106 99*  CO2 28 28  BUN 10 8  CREATININE 0.96 0.91  GLUCOSE 105* 123*  CALCIUM 8.7* 9.2   No results for input(s): LABPT, INR in the last 72 hours.  EXAM General - Patient is Alert, Appropriate and Oriented Extremity - Neurologically intact Neurovascular intact Sensation intact distally Intact pulses distally Dorsiflexion/Plantar flexion intact No cellulitis present Compartment soft Dressing - scant drainage Motor Function - intact, moving foot and toes well on exam.    Past Medical History:  Diagnosis Date  . Anxiety   . Arthritis   . CAD (coronary artery disease)    a. 03/2013 NSTEMI/Cath: 100 RCA, EF 45%->Med Rx;  b. 12/2013 Cath/PCI: LAD 34m, 30d, D1 80, D2 60, LCX nl, RCA 50ost/p,  103m, 95d (2.5x33 Xience DES);  c. 05/2014 Lexi MV: EF 68%, no ischemia; d. stress echo 12/2015 no ischemia @ max exercise (did not achieve target)  . Carotid disease, bilateral ([place])    a. 08/2013 Carotid U/S: 1-39% bilat ICA stenosis.  . Complication of anesthesia   . CVA (cerebral vascular accident) ([place])    a. 08/2013.  [name]   . Depression   . GERD (gastroesophageal reflux disease)   . History of recurrent UTIs   . Hypercholesteremia   . Hyperlipemia   . Hypertension   . Insomnia   . Ischemic cardiomyopathy    a. 03/2013 Echo: EF 30-35%, mod dil LA, mod-sev MR, mod TR;  b. 08/2013 Echo EF 60-65%, mild AI.  Marland Kitchen Menopause   . Migraines   . Myocardial infarction   . PONV (postoperative nausea and vomiting)    If anesthsia is given slowly, pt will not throw up, if given quickly, pt will have nausea and vomiting.  . Right lower quadrant pain   . Syncope and collapse   . Tobacco abuse   . Vaginal atrophy     Assessment/Plan: 4 Days Post-Op Procedure(s) (LRB): TOTAL KNEE ARTHROPLASTY (Left) Active Problems:   Status post total knee replacement using cement, left  Estimated body mass index is 25.61 kg/m as calculated from the following:   Height as of this encounter: 5\' 2"  (1.575 m).   Weight as of this  encounter: 63.5 kg (140 lb). Up with therapy Discharge to SNF  Labs: reviewed DVT Prophylaxis - Lovenox, Foot Pumps and TED hose Weight-Bearing as tolerated to left leg Please change dressing prior to d/c Please apply TED stocking to both legs  Levon Penning R. Burt Ringwood 10/10/2016, 7:31 AM

## 2016-10-10 NOTE — Progress Notes (Signed)
Clinical Social Worker was informed that patient will be medically ready to discharge to [facility]. Patient in a agreement with plan. CSW called [name] - Admissions Coordinator at [facility] to confirm that patient's bed is ready. Provided patient's room number [id number] and number to call for report [phone] . All discharge information faxed to [facility] via [facility].     RN will call report and patient will discharge to [facility] via EMS  Ernest Pine, MSW, LCSW, [place] Social Worker [phone]

## 2016-10-13 ENCOUNTER — Other Ambulatory Visit: Payer: Self-pay

## 2016-10-13 DIAGNOSIS — F419 Anxiety disorder, unspecified: Secondary | ICD-10-CM | POA: Diagnosis not present

## 2016-10-13 DIAGNOSIS — F329 Major depressive disorder, single episode, unspecified: Secondary | ICD-10-CM | POA: Diagnosis not present

## 2016-10-13 DIAGNOSIS — E784 Other hyperlipidemia: Secondary | ICD-10-CM | POA: Diagnosis not present

## 2016-10-13 DIAGNOSIS — K219 Gastro-esophageal reflux disease without esophagitis: Secondary | ICD-10-CM | POA: Diagnosis not present

## 2016-10-13 DIAGNOSIS — I6350 Cerebral infarction due to unspecified occlusion or stenosis of unspecified cerebral artery: Secondary | ICD-10-CM | POA: Diagnosis not present

## 2016-10-13 DIAGNOSIS — S82009A Unspecified fracture of unspecified patella, initial encounter for closed fracture: Secondary | ICD-10-CM | POA: Diagnosis not present

## 2016-10-13 DIAGNOSIS — I10 Essential (primary) hypertension: Secondary | ICD-10-CM | POA: Diagnosis not present

## 2016-10-13 DIAGNOSIS — I2510 Atherosclerotic heart disease of native coronary artery without angina pectoris: Secondary | ICD-10-CM | POA: Diagnosis not present

## 2016-10-13 NOTE — Patient Outreach (Signed)
[place] [facility]) Care Management  10/13/2016  [name] 08/28/1956 [id number]      Telephone Screen  Referral Date: 10/13/16 Referral Source:HTA member discharging from facility Referral Reason: "discharged from an inpatient admission-[facility] to home-no LACE score"    Outreach attempt # 1 to patient. No answer at present. RN CM left HIPAA compliant voicemail message along with contact info.     Plan: RN CM will make outreach attempt to patient within three business days.   Enzo Montgomery, RN,BSN,CCM [place] Management Telephonic Care Management Coordinator Direct Phone: [phone] Toll Free: [phone] Fax: [phone]

## 2016-10-14 ENCOUNTER — Other Ambulatory Visit: Payer: Self-pay

## 2016-10-14 LAB — CULTURE, BLOOD (ROUTINE X 2)
CULTURE: NO GROWTH
Culture: NO GROWTH

## 2016-10-14 NOTE — Patient Outreach (Signed)
[place] [facility] Care Management  10/14/2016  CAROYL BYLER [birth year] [id number]     Telephone Screen  Referral Date: 10/13/16 Referral Source:HTA member discharging from facility Referral Reason: "discharged from an inpatient admission-[facility] to home-no LACE score"   Outreach attempt # 2. No answer at present.    Plan: RN CM will make outreach attempt to patient within three business days.  Enzo Montgomery, RN,BSN,CCM [place] Management Telephonic Care Management Coordinator Direct Phone: [phone] Toll Free: [phone] Fax: [phone]

## 2016-10-15 ENCOUNTER — Other Ambulatory Visit: Payer: Self-pay

## 2016-10-15 NOTE — Patient Outreach (Signed)
[place] [facility]) Care Management  10/15/2016  ALYSS HINDMAN 1956/04/17 [id number]   Telephone Screen  Referral Date: 10/13/16 Referral Source:HTA member discharging from facility Referral Reason: "discharged from an inpatient admission-[facility] to home-no LACE score"    Outreach attempt #3 to patient. No answer. RN CM has made multiple attempts to establish contact with patient without success for post hospital discharge follow up.    Plan: RN CM will send unsuccessful outreach letter to patient and close case if no response from patient within 10 business days.   Enzo Montgomery, RN,BSN,CCM [place] Management Telephonic Care Management Coordinator Direct Phone: [phone] Toll Free: [phone] Fax: [phone]

## 2016-10-20 DIAGNOSIS — I10 Essential (primary) hypertension: Secondary | ICD-10-CM | POA: Diagnosis not present

## 2016-10-20 DIAGNOSIS — K219 Gastro-esophageal reflux disease without esophagitis: Secondary | ICD-10-CM | POA: Diagnosis not present

## 2016-10-20 DIAGNOSIS — I2510 Atherosclerotic heart disease of native coronary artery without angina pectoris: Secondary | ICD-10-CM | POA: Diagnosis not present

## 2016-10-20 DIAGNOSIS — M25569 Pain in unspecified knee: Secondary | ICD-10-CM | POA: Diagnosis not present

## 2016-10-20 DIAGNOSIS — F329 Major depressive disorder, single episode, unspecified: Secondary | ICD-10-CM | POA: Diagnosis not present

## 2016-10-20 DIAGNOSIS — I6350 Cerebral infarction due to unspecified occlusion or stenosis of unspecified cerebral artery: Secondary | ICD-10-CM | POA: Diagnosis not present

## 2016-10-20 DIAGNOSIS — E784 Other hyperlipidemia: Secondary | ICD-10-CM | POA: Diagnosis not present

## 2016-10-26 ENCOUNTER — Other Ambulatory Visit: Payer: Self-pay

## 2016-10-26 DIAGNOSIS — R29898 Other symptoms and signs involving the musculoskeletal system: Secondary | ICD-10-CM | POA: Diagnosis not present

## 2016-10-26 DIAGNOSIS — M25562 Pain in left knee: Secondary | ICD-10-CM | POA: Diagnosis not present

## 2016-10-26 DIAGNOSIS — Z96652 Presence of left artificial knee joint: Secondary | ICD-10-CM | POA: Diagnosis not present

## 2016-10-26 DIAGNOSIS — M25662 Stiffness of left knee, not elsewhere classified: Secondary | ICD-10-CM | POA: Diagnosis not present

## 2016-10-26 NOTE — Patient Outreach (Signed)
[place] [facility]) Care Management  10/26/2016  [name] 1956/03/07 [id number]    Transition of Care Referral: Referral Source:HTA member discharging from facility Referral Reason: "discharged from an inpatient admission-[facility] to [facility] Score:5"   Outreach attempt to patient. No answer at present.     Plan: RN CM has already sent unsuccessful outreach letter and will close case if no response from patient.    Enzo Montgomery, RN,BSN,CCM [place] Management Telephonic Care Management Coordinator Direct Phone: [phone] Toll Free: [phone] Fax: [phone]

## 2016-10-28 ENCOUNTER — Other Ambulatory Visit: Payer: Self-pay | Admitting: *Deleted

## 2016-10-28 DIAGNOSIS — Z96652 Presence of left artificial knee joint: Secondary | ICD-10-CM | POA: Diagnosis not present

## 2016-10-28 NOTE — Patient Outreach (Signed)
[place] [facility]) Care Management  10/28/2016  [name] [birth year] [id number]   Communication with [name] at facility. Verified patient had discharged from facility 10/23/16. Will let [facility] RNCM know of discharge home for Transition of care. Royetta Crochet. Laymond Purser, RN, BSN, [place] [facility] Care Coordinator [phone]

## 2016-10-29 ENCOUNTER — Other Ambulatory Visit: Payer: Self-pay

## 2016-10-29 NOTE — Patient Outreach (Signed)
[place] [facility]) Care Management  10/29/2016  [name] 1956/08/07 [id number]   Referral Date: 10/13/16 Referral Source:HTA member discharging from facility Referral Reason: "discharged from an inpatient admission-[facility] to home-no LACE score"    Outreach attempt to patient. Multiple attempts to establish contact with patient.No response from letter mailed to patient. Case is being closed at this time.    Plan: RN CM will notify [facility] administrative of case closure status. RN CM will send MD case closure letter.   Enzo Montgomery, RN,BSN,CCM [place] Management Telephonic Care Management Coordinator Direct Phone: [phone] Toll Free: [phone] Fax: [phone]

## 2016-11-03 DIAGNOSIS — Z96652 Presence of left artificial knee joint: Secondary | ICD-10-CM | POA: Diagnosis not present

## 2016-11-05 DIAGNOSIS — R29898 Other symptoms and signs involving the musculoskeletal system: Secondary | ICD-10-CM | POA: Diagnosis not present

## 2016-11-05 DIAGNOSIS — M25662 Stiffness of left knee, not elsewhere classified: Secondary | ICD-10-CM | POA: Diagnosis not present

## 2016-11-05 DIAGNOSIS — M25562 Pain in left knee: Secondary | ICD-10-CM | POA: Diagnosis not present

## 2016-11-05 DIAGNOSIS — Z96652 Presence of left artificial knee joint: Secondary | ICD-10-CM | POA: Diagnosis not present

## 2016-11-11 DIAGNOSIS — E7800 Pure hypercholesterolemia, unspecified: Secondary | ICD-10-CM | POA: Diagnosis not present

## 2016-11-11 DIAGNOSIS — I10 Essential (primary) hypertension: Secondary | ICD-10-CM | POA: Diagnosis not present

## 2016-11-11 DIAGNOSIS — M1991 Primary osteoarthritis, unspecified site: Secondary | ICD-10-CM | POA: Diagnosis not present

## 2016-11-11 DIAGNOSIS — Z96652 Presence of left artificial knee joint: Secondary | ICD-10-CM | POA: Diagnosis not present

## 2016-11-11 DIAGNOSIS — F329 Major depressive disorder, single episode, unspecified: Secondary | ICD-10-CM | POA: Diagnosis not present

## 2016-11-12 ENCOUNTER — Telehealth: Payer: Self-pay | Admitting: Pharmacist

## 2016-11-12 NOTE — Telephone Encounter (Signed)
Spoke with pt to follow up with Praluent injections since insurance only approved med 09/07/16 - 11/08/16 which isn't long enough to even see the benefit of therapy. Pt states she gave 2 injections in November but then stopped because she had a procedure on 11/27 and was in the hospital for 2 weeks. Cannot check labwork at this time because medication has washed out by this point. Will resubmit new prior authorization for Praluent. Pt states no insurance change for 2018.

## 2016-11-13 ENCOUNTER — Telehealth: Payer: Self-pay | Admitting: Cardiovascular Disease

## 2016-11-13 DIAGNOSIS — Z96652 Presence of left artificial knee joint: Secondary | ICD-10-CM | POA: Diagnosis not present

## 2016-11-13 NOTE — Telephone Encounter (Signed)
lvm on pt's cell phone informing her that Praluent 75mg /ml PEN was approved from 11/12/2016 - 11/08/2017.

## 2016-11-16 DIAGNOSIS — Z96652 Presence of left artificial knee joint: Secondary | ICD-10-CM | POA: Diagnosis not present

## 2016-11-18 DIAGNOSIS — Z96652 Presence of left artificial knee joint: Secondary | ICD-10-CM | POA: Diagnosis not present

## 2016-11-20 DIAGNOSIS — M25562 Pain in left knee: Secondary | ICD-10-CM | POA: Diagnosis not present

## 2016-11-20 DIAGNOSIS — Z96652 Presence of left artificial knee joint: Secondary | ICD-10-CM | POA: Diagnosis not present

## 2016-11-20 DIAGNOSIS — M25662 Stiffness of left knee, not elsewhere classified: Secondary | ICD-10-CM | POA: Diagnosis not present

## 2016-11-20 DIAGNOSIS — R29898 Other symptoms and signs involving the musculoskeletal system: Secondary | ICD-10-CM | POA: Diagnosis not present

## 2016-11-23 DIAGNOSIS — Z96652 Presence of left artificial knee joint: Secondary | ICD-10-CM | POA: Diagnosis not present

## 2016-12-03 DIAGNOSIS — F410 Panic disorder [episodic paroxysmal anxiety] without agoraphobia: Secondary | ICD-10-CM | POA: Diagnosis not present

## 2016-12-03 DIAGNOSIS — F332 Major depressive disorder, recurrent severe without psychotic features: Secondary | ICD-10-CM | POA: Diagnosis not present

## 2016-12-07 DIAGNOSIS — R29898 Other symptoms and signs involving the musculoskeletal system: Secondary | ICD-10-CM | POA: Diagnosis not present

## 2016-12-07 DIAGNOSIS — M25562 Pain in left knee: Secondary | ICD-10-CM | POA: Diagnosis not present

## 2016-12-07 DIAGNOSIS — Z96652 Presence of left artificial knee joint: Secondary | ICD-10-CM | POA: Diagnosis not present

## 2016-12-07 DIAGNOSIS — M25662 Stiffness of left knee, not elsewhere classified: Secondary | ICD-10-CM | POA: Diagnosis not present

## 2016-12-09 DIAGNOSIS — M25562 Pain in left knee: Secondary | ICD-10-CM | POA: Diagnosis not present

## 2016-12-09 DIAGNOSIS — M25662 Stiffness of left knee, not elsewhere classified: Secondary | ICD-10-CM | POA: Diagnosis not present

## 2016-12-09 DIAGNOSIS — Z96652 Presence of left artificial knee joint: Secondary | ICD-10-CM | POA: Diagnosis not present

## 2016-12-09 DIAGNOSIS — R29898 Other symptoms and signs involving the musculoskeletal system: Secondary | ICD-10-CM | POA: Diagnosis not present

## 2016-12-15 DIAGNOSIS — Z96652 Presence of left artificial knee joint: Secondary | ICD-10-CM | POA: Diagnosis not present

## 2016-12-28 DIAGNOSIS — Z96652 Presence of left artificial knee joint: Secondary | ICD-10-CM | POA: Diagnosis not present

## 2017-02-01 DIAGNOSIS — F410 Panic disorder [episodic paroxysmal anxiety] without agoraphobia: Secondary | ICD-10-CM | POA: Diagnosis not present

## 2017-02-01 DIAGNOSIS — F332 Major depressive disorder, recurrent severe without psychotic features: Secondary | ICD-10-CM | POA: Diagnosis not present

## 2017-02-15 NOTE — Progress Notes (Signed)
Patient ID: [name], female   DOB: 04-30-1956, 61 y.o.   MRN: [id number] ANNUAL PREVENTATIVE CARE GYN  ENCOUNTER NOTE  Subjective:       [name] is a 61 y.o. G12P1001 female here for a routine annual gynecologic exam.  Current complaints: 1.  none   Patient is postmenopausal. She is not experiencing any significant vasomotor symptoms. Bowel function and bladder function are normal. Major interval health issues include a recent left knee replacement. The left knee is functioning very well. She is supplementing calcium with vitamin D daily and is exercising 3 times a week.   Gynecologic History No LMP recorded. Patient has had an ablation. Contraception: none Last Pap: 01/2014 neg/neg. Results were: normal Last mammogram: 02/25/2015 birad 1. Results were: normal  Obstetric History OB History  Gravida Para Term Preterm AB Living  1 1 1     1   SAB TAB Ectopic Multiple Live Births          1    # Outcome Date GA Lbr Len/2nd Weight Sex Delivery Anes PTL Lv  1 Term      CS-LTranv   LIV      Past Medical History:  Diagnosis Date  . Anxiety   . Arthritis   . CAD (coronary artery disease)    a. 03/2013 NSTEMI/Cath: 100 RCA, EF 45%->Med Rx;  b. 12/2013 Cath/PCI: LAD 9m, 30d, D1 80, D2 60, LCX nl, RCA 50ost/p, 25m, 95d (2.5x33 Xience DES);  c. 05/2014 Lexi MV: EF 68%, no ischemia; d. stress echo 12/2015 no ischemia @ max exercise (did not achieve target)  . Carotid disease, bilateral ([place])    a. 08/2013 Carotid U/S: 1-39% bilat ICA stenosis.  . Complication of anesthesia   . CVA (cerebral vascular accident) ([place])    a. 08/2013.  [name]   . Depression   . GERD (gastroesophageal reflux disease)   . History of recurrent UTIs   . Hypercholesteremia   . Hyperlipemia   . Hypertension   . Insomnia   . Ischemic cardiomyopathy    a. 03/2013 Echo: EF 30-35%, mod dil LA, mod-sev MR, mod TR;  b. 08/2013 Echo EF 60-65%, mild AI.  Marland Kitchen Menopause   . Migraines   . Myocardial  infarction   . PONV (postoperative nausea and vomiting)    If anesthsia is given slowly, pt will not throw up, if given quickly, pt will have nausea and vomiting.  . Right lower quadrant pain   . Syncope and collapse   . Tobacco abuse   . Vaginal atrophy     Past Surgical History:  Procedure Laterality Date  . CARDIAC CATHETERIZATION  01/01/2014  . CARDIAC CATHETERIZATION  03/16/2013  . CARDIAC CATHETERIZATION  12/2013  . CARDIAC CATHETERIZATION N/A 08/19/2016   Procedure: Left Heart Cath and Coronary Angiography;  Surgeon: Minna Merritts, MD;  Location: [facility];  Service: Cardiovascular;  Laterality: N/A;  . CESAREAN SECTION WITH BILATERAL TUBAL LIGATION    . CORONARY ANGIOPLASTY WITH STENT PLACEMENT  01/01/2014   95% lesion with a drug eluting stent to the distal RCA.  Marland Kitchen ENDOMETRIAL ABLATION    . KNEE ARTHROSCOPY  11/27/2006   left knee   . OOPHORECTOMY    . TOTAL KNEE ARTHROPLASTY Left 10/06/2016   Procedure: TOTAL KNEE ARTHROPLASTY;  Surgeon: Corky Mull, MD;  Location: [facility] OR[facility];  Service: Orthopedics;  Laterality: Left;  . TUBAL LIGATION      Current Outpatient Prescriptions  on File Prior to Visit  Medication Sig Dispense Refill  . acetaminophen (TYLENOL) 500 MG tablet Take 500 mg by mouth every 6 (six) hours as needed for mild pain or moderate pain.     . Alirocumab (PRALUENT) 75 MG/ML SOPN Inject 1 pen into the skin every 14 (fourteen) days. 2 pen 11  . aspirin 81 MG tablet Take 81 mg by mouth daily.    Marland Kitchen atorvastatin (LIPITOR) 80 MG tablet Take 80 mg by mouth at bedtime.     . Cholecalciferol (VITAMIN D PO) Take 1 capsule by mouth daily.    . clopidogrel (PLAVIX) 75 MG tablet TAKE 1 TABLET BY MOUTH DAILY (Patient taking differently: TAKE 1 TABLET BY MOUTH DAILY AT NIGHT) 90 tablet 4  . DULoxetine (CYMBALTA) 30 MG capsule Take 90 mg by mouth daily.  3  . ezetimibe (ZETIA) 10 MG tablet Take 1 tablet (10 mg total) by mouth daily. 30 tablet 11  . furosemide  (LASIX) 20 MG tablet Take 20 mg by mouth daily as needed for fluid or edema.     . hydrOXYzine (ATARAX/VISTARIL) 25 MG tablet Take 25mg s twice daily as needed for anxiety  3  . isosorbide mononitrate (IMDUR) 60 MG 24 hr tablet Take 1 tablet (60 mg total) by mouth daily. (Patient taking differently: Take 60 mg by mouth at bedtime. ) 30 tablet 6  . metoprolol succinate (TOPROL-XL) 25 MG 24 hr tablet Take 25 mg by mouth every morning.     . nitrofurantoin, macrocrystal-monohydrate, (MACROBID) 100 MG capsule Take 1 capsule (100 mg total) by mouth at bedtime. 90 capsule 4  . nitroGLYCERIN (NITROSTAT) 0.4 MG SL tablet Place 1 tablet (0.4 mg total) under the tongue every 5 (five) minutes as needed for chest pain. 25 tablet 6  . oxyCODONE (OXY IR/ROXICODONE) 5 MG immediate release tablet Take 1-2 tablets (5-10 mg total) by mouth every 4 (four) hours as needed for breakthrough pain. 60 tablet 0  . pantoprazole (PROTONIX) 40 MG tablet Take 40 mg by mouth at bedtime.     . potassium chloride SA (K-DUR,KLOR-CON) 20 MEQ tablet Take 1 tablet (20 mEq total) by mouth 3 (three) times daily. 270 tablet 3  . ranolazine (RANEXA) 500 MG 12 hr tablet Take 1 tablet (500 mg total) by mouth 2 (two) times daily. (Patient not taking: Reported on 09/23/2016) 60 tablet 6  . sertraline (ZOLOFT) 100 MG tablet Take 200 mg by mouth daily.     Marland Kitchen triamterene-hydrochlorothiazide (DYAZIDE) 37.5-25 MG capsule TAKE 1 TABLET BY MOUTH DAILY  5  . zolpidem (AMBIEN) 10 MG tablet Take 10 mg by mouth at bedtime as needed for sleep.     No current facility-administered medications on file prior to visit.     Allergies  Allergen Reactions  . Codeine Sulfate     sick  . Penicillins Nausea And Vomiting    Has patient had a PCN reaction causing immediate rash, facial/tongue/throat swelling, SOB or lightheadedness with hypotension: No Has patient had a PCN reaction causing severe rash involving mucus membranes or skin necrosis: No Has patient  had a PCN reaction that required hospitalization No Has patient had a PCN reaction occurring within the last 10 years: No If all of the above answers are "NO", then may proceed with Cephalosporin use.     Social History   Social History  . Marital status: Single    Spouse name: N/A  . Number of children: 1  . Years of education: N/A  Occupational History  . lab tech [employer]   Social History Main Topics  . Smoking status: Former Smoker    Packs/day: 0.25    Years: 20.00    Types: Cigarettes    Quit date: 04/07/2013  . Smokeless tobacco: Never Used  . Alcohol use Yes     Comment: 1 glass of wine a month  . Drug use: No  . Sexual activity: Not Currently    Birth control/ protection: None   Other Topics Concern  . Not on file   Social History Narrative  . No narrative on file    Family History  Problem Relation Age of Onset  . Hypertension Mother   . Stroke Mother   . Hyperlipidemia Mother   . Breast cancer Paternal Grandmother   . Colon cancer Neg Hx   . Ovarian cancer Neg Hx   . Heart disease Neg Hx   . Diabetes Neg Hx     The following portions of the patient's history were reviewed and updated as appropriate: allergies, current medications, past family history, past medical history, past social history, past surgical history and problem list.  Review of Systems Review of Systems  Constitutional: Negative.   Respiratory: Negative.   Cardiovascular: Negative.   Gastrointestinal: Negative.   Genitourinary: Negative.   Musculoskeletal: Negative.   Skin: Negative.   Neurological: Negative.   Endo/Heme/Allergies: Negative.   Psychiatric/Behavioral: Negative.      Objective:   BP 124/75   Pulse 87   Ht 5\' 2"  (1.575 m)   Wt 142 lb 3.2 oz (64.5 kg)   BMI 26.01 kg/m  CONSTITUTIONAL: Well-developed, well-nourished female in no acute distress.  PSYCHIATRIC: Normal mood and affect. Normal behavior. Normal judgment and thought  content. [place]: Alert and oriented to person, place, and time. Normal muscle tone coordination. No cranial nerve deficit noted. HENT:  Normocephalic, atraumatic, External right and left ear normal. Oropharynx is clear and moist  EYES: Conjunctivae and EOM are normal.  No scleral icterus.  NECK: Normal range of motion, supple, no masses.  Normal thyroid.  SKIN: Skin is warm and dry. No rash noted. Not diaphoretic. No erythema. No pallor. CARDIOVASCULAR: Normal heart rate noted, regular rhythm, no murmur. RESPIRATORY: Clear to auscultation bilaterally. Effort and breath sounds normal, no problems with respiration noted. BREASTS: Symmetric in size. No masses, skin changes, nipple drainage, or lymphadenopathy. ABDOMEN: Soft, normal bowel sounds, no distention noted.  No tenderness, rebound or guarding.  BLADDER: Normal PELVIC:  External Genitalia: Normal  BUS: Normal  Vagina: Normal; Fair estrogen effect  Cervix: Normal; no lesions  Uterus: Normal ; midplane, normal size shape, mobile, nontender  Adnexa: Normal; nonpalpable and nontender  RV: External Exam NormaI, No Rectal Masses and Normal Sphincter tone  MUSCULOSKELETAL: Normal range of motion. No tenderness.  No cyanosis, clubbing, or edema.  2+ distal pulses. LYMPHATIC: No Axillary, Supraclavicular, or Inguinal Adenopathy.    Assessment:   Annual gynecologic examination 61 y.o. Contraception: none Normal BMI  menopause, asymptomatic     Plan:  Pap:pap w/hpv Mammogram: Ordered Stool Guaiac Testing:  Ordered Labs: thru pcp Routine preventative health maintenance measures emphasized: Exercise/Diet/Weight control, Tobacco Warnings and Alcohol/Substance use risks Return to [place], CMA  Brayton Mars, MD  Note: This dictation was prepared with Dragon dictation along with smaller phrase technology. Any transcriptional errors that result from this process are unintentional.

## 2017-02-16 ENCOUNTER — Encounter: Payer: PRIVATE HEALTH INSURANCE | Admitting: Obstetrics and Gynecology

## 2017-02-17 ENCOUNTER — Ambulatory Visit (INDEPENDENT_AMBULATORY_CARE_PROVIDER_SITE_OTHER): Payer: PPO | Admitting: Obstetrics and Gynecology

## 2017-02-17 ENCOUNTER — Encounter: Payer: Self-pay | Admitting: Obstetrics and Gynecology

## 2017-02-17 VITALS — BP 124/75 | HR 87 | Ht 62.0 in | Wt 142.2 lb

## 2017-02-17 DIAGNOSIS — Z01419 Encounter for gynecological examination (general) (routine) without abnormal findings: Secondary | ICD-10-CM

## 2017-02-17 DIAGNOSIS — Z1239 Encounter for other screening for malignant neoplasm of breast: Secondary | ICD-10-CM

## 2017-02-17 DIAGNOSIS — Z1211 Encounter for screening for malignant neoplasm of colon: Secondary | ICD-10-CM | POA: Diagnosis not present

## 2017-02-17 DIAGNOSIS — N390 Urinary tract infection, site not specified: Secondary | ICD-10-CM | POA: Diagnosis not present

## 2017-02-17 DIAGNOSIS — Z780 Asymptomatic menopausal state: Secondary | ICD-10-CM | POA: Diagnosis not present

## 2017-02-17 DIAGNOSIS — Z1231 Encounter for screening mammogram for malignant neoplasm of breast: Secondary | ICD-10-CM | POA: Diagnosis not present

## 2017-02-17 MED ORDER — NITROFURANTOIN MONOHYD MACRO 100 MG PO CAPS: 100.0000 mg | ORAL_CAPSULE | Freq: Every day | ORAL | 4 refills | Status: DC

## 2017-02-17 NOTE — Patient Instructions (Signed)
1. Pap smear is completed 2. Mammogram is ordered 3. Stool guaiac cards are given for colon cancer screening 4. Screening labs are to be obtained through her primary care provider 5. Continue with healthy eating and exercise 6. Continue with calcium and vitamin D supplementation daily 7. Return in 1 year for annual exam  Health Maintenance, Female Adopting a healthy lifestyle and getting preventive care can go a long way to promote health and wellness. Talk with your health care provider about what schedule of regular examinations is right for you. This is a good chance for you to check in with your provider about disease prevention and staying healthy. In between checkups, there are plenty of things you can do on your own. Experts have done a lot of research about which lifestyle changes and preventive measures are most likely to keep you healthy. Ask your health care provider for more information. Weight and diet Eat a healthy diet  Be sure to include plenty of vegetables, fruits, low-fat dairy products, and lean protein.  Do not eat a lot of foods high in solid fats, added sugars, or salt.  Get regular exercise. This is one of the most important things you can do for your health.  Most adults should exercise for at least 150 minutes each week. The exercise should increase your heart rate and make you sweat (moderate-intensity exercise).  Most adults should also do strengthening exercises at least twice a week. This is in addition to the moderate-intensity exercise. Maintain a healthy weight  Body mass index (BMI) is a measurement that can be used to identify possible weight problems. It estimates body fat based on height and weight. Your health care provider can help determine your BMI and help you achieve or maintain a healthy weight.  For females 53 years of age and older:  A BMI below 18.5 is considered underweight.  A BMI of 18.5 to 24.9 is normal.  A BMI of 25 to 29.9 is  considered overweight.  A BMI of 30 and above is considered obese. Watch levels of cholesterol and blood lipids  You should start having your blood tested for lipids and cholesterol at 60 years of age, then have this test every 5 years.  You may need to have your cholesterol levels checked more often if:  Your lipid or cholesterol levels are high.  You are older than 60 years of age.  You are at high risk for heart disease. Cancer screening Lung Cancer  Lung cancer screening is recommended for adults 79-79 years old who are at high risk for lung cancer because of a history of smoking.  A yearly low-dose CT scan of the lungs is recommended for people who:  Currently smoke.  Have quit within the past 15 years.  Have at least a 30-pack-year history of smoking. A pack year is smoking an average of one pack of cigarettes a day for 1 year.  Yearly screening should continue until it has been 15 years since you quit.  Yearly screening should stop if you develop a health problem that would prevent you from having lung cancer treatment. Breast Cancer  Practice breast self-awareness. This means understanding how your breasts normally appear and feel.  It also means doing regular breast self-exams. Let your health care provider know about any changes, no matter how small.  If you are in your 20s or 30s, you should have a clinical breast exam (CBE) by a health care provider every 1-3 years as part  of a regular health exam.  If you are 2 or older, have a CBE every year. Also consider having a breast X-ray (mammogram) every year.  If you have a family history of breast cancer, talk to your health care provider about genetic screening.  If you are at high risk for breast cancer, talk to your health care provider about having an MRI and a mammogram every year.  Breast cancer gene (BRCA) assessment is recommended for women who have family members with BRCA-related cancers. BRCA-related  cancers include:  Breast.  Ovarian.  Tubal.  Peritoneal cancers.  Results of the assessment will determine the need for genetic counseling and BRCA1 and BRCA2 testing. Cervical Cancer  Your health care provider may recommend that you be screened regularly for cancer of the pelvic organs (ovaries, uterus, and vagina). This screening involves a pelvic examination, including checking for microscopic changes to the surface of your cervix (Pap test). You may be encouraged to have this screening done every 3 years, beginning at age 43.  For women ages 6-65, health care providers may recommend pelvic exams and Pap testing every 3 years, or they may recommend the Pap and pelvic exam, combined with testing for human papilloma virus (HPV), every 5 years. Some types of HPV increase your risk of cervical cancer. Testing for HPV may also be done on women of any age with unclear Pap test results.  Other health care providers may not recommend any screening for nonpregnant women who are considered low risk for pelvic cancer and who do not have symptoms. Ask your health care provider if a screening pelvic exam is right for you.  If you have had past treatment for cervical cancer or a condition that could lead to cancer, you need Pap tests and screening for cancer for at least 20 years after your treatment. If Pap tests have been discontinued, your risk factors (such as having a new sexual partner) need to be reassessed to determine if screening should resume. Some women have medical problems that increase the chance of getting cervical cancer. In these cases, your health care provider may recommend more frequent screening and Pap tests. Colorectal Cancer  This type of cancer can be detected and often prevented.  Routine colorectal cancer screening usually begins at 60 years of age and continues through 60 years of age.  Your health care provider may recommend screening at an earlier age if you have risk  factors for colon cancer.  Your health care provider may also recommend using home test kits to check for hidden blood in the stool.  A small camera at the end of a tube can be used to examine your colon directly (sigmoidoscopy or colonoscopy). This is done to check for the earliest forms of colorectal cancer.  Routine screening usually begins at age 74.  Direct examination of the colon should be repeated every 5-10 years through 60 years of age. However, you may need to be screened more often if early forms of precancerous polyps or small growths are found. Skin Cancer  Check your skin from head to toe regularly.  Tell your health care provider about any new moles or changes in moles, especially if there is a change in a mole's shape or color.  Also tell your health care provider if you have a mole that is larger than the size of a pencil eraser.  Always use sunscreen. Apply sunscreen liberally and repeatedly throughout the day.  Protect yourself by wearing long sleeves, pants,  a wide-brimmed hat, and sunglasses whenever you are outside. Heart disease, diabetes, and high blood pressure  High blood pressure causes heart disease and increases the risk of stroke. High blood pressure is more likely to develop in:  People who have blood pressure in the high end of the normal range (130-139/85-89 mm Hg).  People who are overweight or obese.  People who are African American.  If you are 35-66 years of age, have your blood pressure checked every 3-5 years. If you are 8 years of age or older, have your blood pressure checked every year. You should have your blood pressure measured twice-once when you are at a hospital or clinic, and once when you are not at a hospital or clinic. Record the average of the two measurements. To check your blood pressure when you are not at a hospital or clinic, you can use:  An automated blood pressure machine at a pharmacy.  A home blood pressure  monitor.  If you are between 40 years and 36 years old, ask your health care provider if you should take aspirin to prevent strokes.  Have regular diabetes screenings. This involves taking a blood sample to check your fasting blood sugar level.  If you are at a normal weight and have a low risk for diabetes, have this test once every three years after 60 years of age.  If you are overweight and have a high risk for diabetes, consider being tested at a younger age or more often. Preventing infection Hepatitis B  If you have a higher risk for hepatitis B, you should be screened for this virus. You are considered at high risk for hepatitis B if:  You were born in a country where hepatitis B is common. Ask your health care provider which countries are considered high risk.  Your parents were born in a high-risk country, and you have not been immunized against hepatitis B (hepatitis B vaccine).  You have HIV or AIDS.  You use needles to inject street drugs.  You live with someone who has hepatitis B.  You have had sex with someone who has hepatitis B.  You get hemodialysis treatment.  You take certain medicines for conditions, including cancer, organ transplantation, and autoimmune conditions. Hepatitis C  Blood testing is recommended for:  Everyone born from [birth year] through 1964.  Anyone with known risk factors for hepatitis C. Sexually transmitted infections (STIs)  You should be screened for sexually transmitted infections (STIs) including gonorrhea and chlamydia if:  You are sexually active and are younger than 60 years of age.  You are older than 60 years of age and your health care provider tells you that you are at risk for this type of infection.  Your sexual activity has changed since you were last screened and you are at an increased risk for chlamydia or gonorrhea. Ask your health care provider if you are at risk.  If you do not have HIV, but are at risk, it may be  recommended that you take a prescription medicine daily to prevent HIV infection. This is called pre-exposure prophylaxis (PrEP). You are considered at risk if:  You are sexually active and do not regularly use condoms or know the HIV status of your partner(s).  You take drugs by injection.  You are sexually active with a partner who has HIV. Talk with your health care provider about whether you are at high risk of being infected with HIV. If you choose to begin PrEP, you  should first be tested for HIV. You should then be tested every 3 months for as long as you are taking PrEP. Pregnancy  If you are premenopausal and you may become pregnant, ask your health care provider about preconception counseling.  If you may become pregnant, take 400 to 800 micrograms (mcg) of folic acid every day.  If you want to prevent pregnancy, talk to your health care provider about birth control (contraception). Osteoporosis and menopause  Osteoporosis is a disease in which the bones lose minerals and strength with aging. This can result in serious bone fractures. Your risk for osteoporosis can be identified using a bone density scan.  If you are 15 years of age or older, or if you are at risk for osteoporosis and fractures, ask your health care provider if you should be screened.  Ask your health care provider whether you should take a calcium or vitamin D supplement to lower your risk for osteoporosis.  Menopause may have certain physical symptoms and risks.  Hormone replacement therapy may reduce some of these symptoms and risks. Talk to your health care provider about whether hormone replacement therapy is right for you. Follow these instructions at home:  Schedule regular health, dental, and eye exams.  Stay current with your immunizations.  Do not use any tobacco products including cigarettes, chewing tobacco, or electronic cigarettes.  If you are pregnant, do not drink alcohol.  If you are  breastfeeding, limit how much and how often you drink alcohol.  Limit alcohol intake to no more than 1 drink per day for nonpregnant women. One drink equals 12 ounces of beer, 5 ounces of wine, or 1 ounces of hard liquor.  Do not use street drugs.  Do not share needles.  Ask your health care provider for help if you need support or information about quitting drugs.  Tell your health care provider if you often feel depressed.  Tell your health care provider if you have ever been abused or do not feel safe at home. This information is not intended to replace advice given to you by your health care provider. Make sure you discuss any questions you have with your health care provider. Document Released: 05/11/2011 Document Revised: 04/02/2016 Document Reviewed: 07/30/2015 Elsevier Interactive Patient Education  2017 [employer].

## 2017-02-19 LAB — PAP IG AND HPV HIGH-RISK
HPV, high-risk: NEGATIVE
PAP Smear Comment: 0

## 2017-02-23 DIAGNOSIS — F332 Major depressive disorder, recurrent severe without psychotic features: Secondary | ICD-10-CM | POA: Diagnosis not present

## 2017-02-23 DIAGNOSIS — F410 Panic disorder [episodic paroxysmal anxiety] without agoraphobia: Secondary | ICD-10-CM | POA: Diagnosis not present

## 2017-03-09 DIAGNOSIS — F410 Panic disorder [episodic paroxysmal anxiety] without agoraphobia: Secondary | ICD-10-CM | POA: Diagnosis not present

## 2017-03-09 DIAGNOSIS — F332 Major depressive disorder, recurrent severe without psychotic features: Secondary | ICD-10-CM | POA: Diagnosis not present

## 2017-03-29 DIAGNOSIS — Z96652 Presence of left artificial knee joint: Secondary | ICD-10-CM | POA: Diagnosis not present

## 2017-03-30 DIAGNOSIS — F332 Major depressive disorder, recurrent severe without psychotic features: Secondary | ICD-10-CM | POA: Diagnosis not present

## 2017-03-30 DIAGNOSIS — F410 Panic disorder [episodic paroxysmal anxiety] without agoraphobia: Secondary | ICD-10-CM | POA: Diagnosis not present

## 2017-04-10 ENCOUNTER — Other Ambulatory Visit: Payer: Self-pay | Admitting: Neurology

## 2017-04-14 DIAGNOSIS — F332 Major depressive disorder, recurrent severe without psychotic features: Secondary | ICD-10-CM | POA: Diagnosis not present

## 2017-04-14 DIAGNOSIS — F410 Panic disorder [episodic paroxysmal anxiety] without agoraphobia: Secondary | ICD-10-CM | POA: Diagnosis not present

## 2017-04-29 DIAGNOSIS — F410 Panic disorder [episodic paroxysmal anxiety] without agoraphobia: Secondary | ICD-10-CM | POA: Diagnosis not present

## 2017-04-29 DIAGNOSIS — F5105 Insomnia due to other mental disorder: Secondary | ICD-10-CM | POA: Diagnosis not present

## 2017-04-29 DIAGNOSIS — F332 Major depressive disorder, recurrent severe without psychotic features: Secondary | ICD-10-CM | POA: Diagnosis not present

## 2017-05-03 DIAGNOSIS — F332 Major depressive disorder, recurrent severe without psychotic features: Secondary | ICD-10-CM | POA: Diagnosis not present

## 2017-05-03 DIAGNOSIS — F410 Panic disorder [episodic paroxysmal anxiety] without agoraphobia: Secondary | ICD-10-CM | POA: Diagnosis not present

## 2017-05-04 DIAGNOSIS — E7800 Pure hypercholesterolemia, unspecified: Secondary | ICD-10-CM | POA: Diagnosis not present

## 2017-05-04 DIAGNOSIS — I10 Essential (primary) hypertension: Secondary | ICD-10-CM | POA: Diagnosis not present

## 2017-05-11 DIAGNOSIS — I10 Essential (primary) hypertension: Secondary | ICD-10-CM | POA: Diagnosis not present

## 2017-05-11 DIAGNOSIS — F329 Major depressive disorder, single episode, unspecified: Secondary | ICD-10-CM | POA: Diagnosis not present

## 2017-05-11 DIAGNOSIS — I509 Heart failure, unspecified: Secondary | ICD-10-CM | POA: Diagnosis not present

## 2017-05-11 DIAGNOSIS — H5462 Unqualified visual loss, left eye, normal vision right eye: Secondary | ICD-10-CM | POA: Diagnosis not present

## 2017-05-11 DIAGNOSIS — E7800 Pure hypercholesterolemia, unspecified: Secondary | ICD-10-CM | POA: Diagnosis not present

## 2017-05-11 DIAGNOSIS — I214 Non-ST elevation (NSTEMI) myocardial infarction: Secondary | ICD-10-CM | POA: Diagnosis not present

## 2017-05-11 DIAGNOSIS — Z1159 Encounter for screening for other viral diseases: Secondary | ICD-10-CM | POA: Diagnosis not present

## 2017-05-11 DIAGNOSIS — I639 Cerebral infarction, unspecified: Secondary | ICD-10-CM | POA: Diagnosis not present

## 2017-05-14 DIAGNOSIS — H43812 Vitreous degeneration, left eye: Secondary | ICD-10-CM | POA: Diagnosis not present

## 2017-05-18 DIAGNOSIS — F410 Panic disorder [episodic paroxysmal anxiety] without agoraphobia: Secondary | ICD-10-CM | POA: Diagnosis not present

## 2017-05-18 DIAGNOSIS — F332 Major depressive disorder, recurrent severe without psychotic features: Secondary | ICD-10-CM | POA: Diagnosis not present

## 2017-05-23 NOTE — Progress Notes (Signed)
Cardiology Office Note  Date:  05/27/2017   ID:  [name], [name] Aug 16, 1956, MRN [id number]  PCP:  Leonel Ramsay, MD   Chief Complaint  Patient presents with  . other    No complaints today. Meds reviewed verbally with pt.    HPI:  Ms. Henton is a 61 year old woman with long history of  smoking who stopped in May 2014,  coronary artery disease with non-ST elevation in may 2015 with stent placed to her distal RCA,  stroke October 2014 evaluated at [facility].  Previous anginal symptoms included back pain, chest pain like an elephant sitting on her chest cardiac catheterization performed for anginal symptoms, Cath 08/19/2016:, No intervention performed  She presents for routine followup of her coronary artery disease.   cardiac catheterization performed for anginal symptoms Cath 08/19/2016:  Left anterior descending (LAD):30% proximal LAD disease, 40% mid LAD disease. 70% ostial and proximal disease of the D2 and D3 vessel Right coronary artery (RCA):40 to 50% ostial disease, 30% proximal disease.  Left ventriculography: Left ventricular systolic function is normal, LVEF is estimated at 55-65%, there is no significant mitral regurgitation , no significant aortic valve stenosis  Conclusions:  Stable mild to moderate LAD disease Moderate to severe ostial and proximal diagonal disease (unchanged or improved from previous study) Mild to Moderate ostial RCA disease, mild proximal RCA disease Normal EF No intervention performed   she was noted to have some coronary spasm that resolved with nitroglycerin  In follow-up today she in eyes any significant chest pain, has been traveling to the beach on a frequent basis, planning to go to [place] She has been out of Plavix and isosorbide since January 2018, no change in her symptoms  Long discussion concerning recent lab work done through primary care total cholesterol to 350 reports she has been compliant with her  atorvastatin  She is not on potassium supplement, recent potassium 4.2, discussed with her  EKG on today's visit shows normal sinus rhythm with rate 71 bpm, nonspecific T wave abnormality  Other past medical history She has severe ostio arthritis of the knee, bone-on-bone Tolerating her cholesterol medication,  on Lipitor 80 mg daily  Review of lab work from April 2016 shows total cholesterol 156, LDL 69   hospitalization 12/31/2013 where she presented with angina. He had a catheterization with distal RCA stent placed.  Also started on ranexa, continued on isosorbide.   Previous Cardiac catheterization report 01/01/2014 details 95% distal RCA lesion with stent placed. Also had 40% mid LAD, 30% distal LAD, 80% D1 disease, 60% D2 disease, 50% ostial RCA disease, 50% proximal RCA disease, 60% mid RCA disease stent placed was a xience  EX 2.5 millimeter stent  Cardiac catheterization in 03/16/2013 details occluded mid RCA with collaterals from distal LAD also 75% stenosis diagonal #1 near the ostium Ejection fraction at that time was 46% notes also detailed 40% mid LAD, 30% distal LAD, 50% D2 disease, 30% proximal circumflex, 75% proximal RCA disease  Echocardiogram 03/16/2013 with ejection fraction 30-35%, moderate to severe mitral valve regurgitation, moderate tricuspid valve regurgitation .  echocardiogram October 2014 following her stroke showed relatively normal ejection fraction estimated at least greater than 55% with no significant valvular regurgitation  PMH:   has a past medical history of Anxiety; Arthritis; CAD (coronary artery disease); Carotid disease, bilateral ([place]); Complication of anesthesia; CVA (cerebral vascular accident) ([place]); Decreased libido; Depression; GERD (gastroesophageal reflux disease); History of recurrent UTIs; Hypercholesteremia; Hyperlipemia; Hypertension; Insomnia; Ischemic cardiomyopathy; Menopause;  Migraines; Myocardial infarction ([place]); PONV  (postoperative nausea and vomiting); Right lower quadrant pain; Syncope and collapse; Tobacco abuse; and Vaginal atrophy.  PSH:    Past Surgical History:  Procedure Laterality Date  . CARDIAC CATHETERIZATION  01/01/2014  . CARDIAC CATHETERIZATION  03/16/2013  . CARDIAC CATHETERIZATION  12/2013  . CARDIAC CATHETERIZATION N/A 08/19/2016   Procedure: Left Heart Cath and Coronary Angiography;  Surgeon: Minna Merritts, MD;  Location: [facility];  Service: Cardiovascular;  Laterality: N/A;  . CESAREAN SECTION WITH BILATERAL TUBAL LIGATION    . CORONARY ANGIOPLASTY WITH STENT PLACEMENT  01/01/2014   95% lesion with a drug eluting stent to the distal RCA.  Marland Kitchen ENDOMETRIAL ABLATION    . KNEE ARTHROSCOPY  11/27/2006   left knee   . OOPHORECTOMY    . TOTAL KNEE ARTHROPLASTY Left 10/06/2016   Procedure: TOTAL KNEE ARTHROPLASTY;  Surgeon: Corky Mull, MD;  Location: [facility];  Service: Orthopedics;  Laterality: Left;  . TUBAL LIGATION      Current Outpatient Prescriptions  Medication Sig Dispense Refill  . acetaminophen (TYLENOL) 500 MG tablet Take 500 mg by mouth every 6 (six) hours as needed for mild pain or moderate pain.     . Alirocumab (PRALUENT) 75 MG/ML SOPN Inject 1 pen into the skin every 14 (fourteen) days. 2 pen 11  . aspirin 81 MG tablet Take 81 mg by mouth daily.    Marland Kitchen atorvastatin (LIPITOR) 80 MG tablet Take 1 tablet (80 mg total) by mouth at bedtime. 90 tablet 4  . Cholecalciferol (VITAMIN D PO) Take 1 capsule by mouth daily.    . DULoxetine (CYMBALTA) 30 MG capsule Take 90 mg by mouth daily.  3  . furosemide (LASIX) 20 MG tablet Take 20 mg by mouth daily as needed for fluid or edema.     . hydrOXYzine (ATARAX/VISTARIL) 25 MG tablet Take 25mg s twice daily as needed for anxiety  3  . metoprolol succinate (TOPROL-XL) 25 MG 24 hr tablet Take 25 mg by mouth every morning.     . nitrofurantoin, macrocrystal-monohydrate, (MACROBID) 100 MG capsule Take 1 capsule (100 mg total) by  mouth at bedtime. 90 capsule 4  . nitroGLYCERIN (NITROSTAT) 0.4 MG SL tablet Place 1 tablet (0.4 mg total) under the tongue every 5 (five) minutes as needed for chest pain. 25 tablet 6  . pantoprazole (PROTONIX) 40 MG tablet Take 40 mg by mouth at bedtime.     . potassium chloride SA (K-DUR,KLOR-CON) 20 MEQ tablet Take 1 tablet (20 mEq total) by mouth daily as needed. 30 tablet 3  . sertraline (ZOLOFT) 100 MG tablet Take 200 mg by mouth daily.     Marland Kitchen triamterene-hydrochlorothiazide (DYAZIDE) 37.5-25 MG capsule TAKE 1 TABLET BY MOUTH DAILY  5  . zolpidem (AMBIEN) 10 MG tablet Take 10 mg by mouth at bedtime as needed for sleep.    Marland Kitchen ezetimibe (ZETIA) 10 MG tablet Take 1 tablet (10 mg total) by mouth daily. 90 tablet 4   No current facility-administered medications for this visit.      Allergies:   Codeine sulfate and Penicillins   Social History:  The patient  reports that she quit smoking about 4 years ago. Her smoking use included Cigarettes. She has a 5.00 pack-year smoking history. She has never used smokeless tobacco. She reports that she drinks alcohol. She reports that she does not use drugs.   Family History:   family history includes Breast cancer in her paternal grandmother;  Hyperlipidemia in her mother; Hypertension in her mother; Stroke in her mother.    Review of Systems: Review of Systems  Constitutional: Negative.   Respiratory: Negative.   Cardiovascular: Negative.   Gastrointestinal: Negative.   Musculoskeletal: Negative.   Neurological: Negative.   Psychiatric/Behavioral: Negative.   All other systems reviewed and are negative.    PHYSICAL EXAM: VS:  BP 110/80 (BP Location: Left Arm, Patient Position: Sitting, Cuff Size: Normal)   Pulse 71   Ht 5\' 2"  (1.575 m)   Wt 139 lb 4 oz (63.2 kg)   BMI 25.47 kg/m  , BMI Body mass index is 25.47 kg/m.  GEN: Well nourished, well developed, in no acute distress  HEENT: normal  Neck: no JVD, carotid bruits, or  masses Cardiac: RRR; no murmurs, rubs, or gallops,no edema  Respiratory:  clear to auscultation bilaterally, normal work of breathing GI: soft, nontender, nondistended, + BS MS: no deformity or atrophy  Skin: warm and dry, no rash Neuro:  Strength and sensation are intact Psych: euthymic mood, full affect    Recent Labs: 10/09/2016: BUN 8; Creatinine, Ser 0.91; Potassium 3.7; Sodium 134 10/10/2016: Hemoglobin 10.9; Platelets 256    Lipid Panel Lab Results  Component Value Date   CHOL 156 02/18/2015   HDL 55 02/18/2015   LDLCALC 69 02/18/2015   TRIG 162 (H) 02/18/2015      Wt Readings from Last 3 Encounters:  05/27/17 139 lb 4 oz (63.2 kg)  02/17/17 142 lb 3.2 oz (64.5 kg)  10/06/16 140 lb (63.5 kg)       ASSESSMENT AND PLAN:  CAD in native artery - Plan: EKG 12-Lead Currently with no symptoms of angina. No further workup at this time. Continue current medication regimen.  Essential hypertension - Plan: EKG 12-Lead Blood pressure is well controlled on today's visit. No changes made to the medications. We will not restart isosorbide given low blood pressure and no symptoms  Pure hypercholesterolemia Long discussion concerning various treatment options for her hyperlipidemia Currently taking high-dose Lipitor  Unclear if recent total cholesterol 350 was secondary to noncompliance or lab where Stressed importance of staying on her Lipitor We will start Zetia  Bilateral carotid artery disease ([place]) Stressed importance of smoking cessation, aggressive lipid panel  Angina pectoris ([place]) Currently without chest pain symptoms. Stable disease on cardiac catheterization. In 2017 Recommended she take sublingual nitroglycerin for any additional episodes of stable angina  History of coronary artery stent placement Does not need Plavix at this time, discussed with her Last in 2015    Total encounter time more than 25 minutes  Greater than 50% was spent in counseling and  coordination of care with the patient  Disposition:   F/U  12 months   Orders Placed This Encounter  Procedures  . EKG 12-Lead     Signed, Esmond Plants, M.D., Ph.D. 05/27/2017  [place], [place]

## 2017-05-25 DIAGNOSIS — L74510 Primary focal hyperhidrosis, axilla: Secondary | ICD-10-CM | POA: Diagnosis not present

## 2017-05-25 DIAGNOSIS — L821 Other seborrheic keratosis: Secondary | ICD-10-CM | POA: Diagnosis not present

## 2017-05-25 DIAGNOSIS — L814 Other melanin hyperpigmentation: Secondary | ICD-10-CM | POA: Diagnosis not present

## 2017-05-25 DIAGNOSIS — L578 Other skin changes due to chronic exposure to nonionizing radiation: Secondary | ICD-10-CM | POA: Diagnosis not present

## 2017-05-25 DIAGNOSIS — D485 Neoplasm of uncertain behavior of skin: Secondary | ICD-10-CM | POA: Diagnosis not present

## 2017-05-25 DIAGNOSIS — Z1283 Encounter for screening for malignant neoplasm of skin: Secondary | ICD-10-CM | POA: Diagnosis not present

## 2017-05-25 DIAGNOSIS — D229 Melanocytic nevi, unspecified: Secondary | ICD-10-CM | POA: Diagnosis not present

## 2017-05-27 ENCOUNTER — Ambulatory Visit (INDEPENDENT_AMBULATORY_CARE_PROVIDER_SITE_OTHER): Payer: PPO | Admitting: Cardiovascular Disease

## 2017-05-27 VITALS — BP 110/80 | HR 71 | Ht 62.0 in | Wt 139.2 lb

## 2017-05-27 DIAGNOSIS — I10 Essential (primary) hypertension: Secondary | ICD-10-CM | POA: Diagnosis not present

## 2017-05-27 DIAGNOSIS — Z955 Presence of coronary angioplasty implant and graft: Secondary | ICD-10-CM

## 2017-05-27 DIAGNOSIS — I209 Angina pectoris, unspecified: Secondary | ICD-10-CM

## 2017-05-27 DIAGNOSIS — I25118 Atherosclerotic heart disease of native coronary artery with other forms of angina pectoris: Secondary | ICD-10-CM | POA: Diagnosis not present

## 2017-05-27 DIAGNOSIS — I739 Peripheral vascular disease, unspecified: Secondary | ICD-10-CM

## 2017-05-27 DIAGNOSIS — I779 Disorder of arteries and arterioles, unspecified: Secondary | ICD-10-CM | POA: Diagnosis not present

## 2017-05-27 DIAGNOSIS — E7800 Pure hypercholesterolemia, unspecified: Secondary | ICD-10-CM

## 2017-05-27 MED ORDER — EZETIMIBE 10 MG PO TABS: 10.0000 mg | ORAL_TABLET | Freq: Every day | ORAL | 4 refills | Status: DC

## 2017-05-27 MED ORDER — POTASSIUM CHLORIDE CRYS ER 20 MEQ PO TBCR: 20.0000 meq | EXTENDED_RELEASE_TABLET | Freq: Every day | ORAL | 3 refills | Status: DC | PRN

## 2017-05-27 MED ORDER — ATORVASTATIN CALCIUM 80 MG PO TABS: 80.0000 mg | ORAL_TABLET | Freq: Every day | ORAL | 4 refills | Status: DC

## 2017-05-27 NOTE — Patient Instructions (Addendum)
Medication Instructions:   Start zetia one a day for cholesterol  Labwork:  No new labs needed  Testing/Procedures:  No further testing at this time   Follow-Up: It was a pleasure seeing you in the office today. Please call us if you have new issues that need to be addressed before your next appt.  [phone]  Your physician wants you to follow-up in: 12 months.  You will receive a reminder letter in the mail two months in advance. If you don't receive a letter, please call our office to schedule the follow-up appointment.  If you need a refill on your cardiac medications before your next appointment, please call your pharmacy.

## 2017-05-31 DIAGNOSIS — F332 Major depressive disorder, recurrent severe without psychotic features: Secondary | ICD-10-CM | POA: Diagnosis not present

## 2017-05-31 DIAGNOSIS — F410 Panic disorder [episodic paroxysmal anxiety] without agoraphobia: Secondary | ICD-10-CM | POA: Diagnosis not present

## 2017-06-15 DIAGNOSIS — F332 Major depressive disorder, recurrent severe without psychotic features: Secondary | ICD-10-CM | POA: Diagnosis not present

## 2017-06-15 DIAGNOSIS — F410 Panic disorder [episodic paroxysmal anxiety] without agoraphobia: Secondary | ICD-10-CM | POA: Diagnosis not present

## 2017-06-18 DIAGNOSIS — H43812 Vitreous degeneration, left eye: Secondary | ICD-10-CM | POA: Diagnosis not present

## 2017-06-28 DIAGNOSIS — F332 Major depressive disorder, recurrent severe without psychotic features: Secondary | ICD-10-CM | POA: Diagnosis not present

## 2017-06-28 DIAGNOSIS — F5105 Insomnia due to other mental disorder: Secondary | ICD-10-CM | POA: Diagnosis not present

## 2017-06-28 DIAGNOSIS — F410 Panic disorder [episodic paroxysmal anxiety] without agoraphobia: Secondary | ICD-10-CM | POA: Diagnosis not present

## 2017-07-13 DIAGNOSIS — F332 Major depressive disorder, recurrent severe without psychotic features: Secondary | ICD-10-CM | POA: Diagnosis not present

## 2017-07-13 DIAGNOSIS — F410 Panic disorder [episodic paroxysmal anxiety] without agoraphobia: Secondary | ICD-10-CM | POA: Diagnosis not present

## 2017-07-27 DIAGNOSIS — F410 Panic disorder [episodic paroxysmal anxiety] without agoraphobia: Secondary | ICD-10-CM | POA: Diagnosis not present

## 2017-07-27 DIAGNOSIS — F332 Major depressive disorder, recurrent severe without psychotic features: Secondary | ICD-10-CM | POA: Diagnosis not present

## 2017-07-29 DIAGNOSIS — F332 Major depressive disorder, recurrent severe without psychotic features: Secondary | ICD-10-CM | POA: Diagnosis not present

## 2017-07-29 DIAGNOSIS — F5105 Insomnia due to other mental disorder: Secondary | ICD-10-CM | POA: Diagnosis not present

## 2017-07-29 DIAGNOSIS — F410 Panic disorder [episodic paroxysmal anxiety] without agoraphobia: Secondary | ICD-10-CM | POA: Diagnosis not present

## 2017-08-06 DIAGNOSIS — Z1159 Encounter for screening for other viral diseases: Secondary | ICD-10-CM | POA: Diagnosis not present

## 2017-08-06 DIAGNOSIS — I214 Non-ST elevation (NSTEMI) myocardial infarction: Secondary | ICD-10-CM | POA: Diagnosis not present

## 2017-08-06 DIAGNOSIS — E7800 Pure hypercholesterolemia, unspecified: Secondary | ICD-10-CM | POA: Diagnosis not present

## 2017-08-13 DIAGNOSIS — I214 Non-ST elevation (NSTEMI) myocardial infarction: Secondary | ICD-10-CM | POA: Diagnosis not present

## 2017-08-13 DIAGNOSIS — E7800 Pure hypercholesterolemia, unspecified: Secondary | ICD-10-CM | POA: Diagnosis not present

## 2017-08-13 DIAGNOSIS — I10 Essential (primary) hypertension: Secondary | ICD-10-CM | POA: Diagnosis not present

## 2017-08-16 DIAGNOSIS — F410 Panic disorder [episodic paroxysmal anxiety] without agoraphobia: Secondary | ICD-10-CM | POA: Diagnosis not present

## 2017-08-16 DIAGNOSIS — F332 Major depressive disorder, recurrent severe without psychotic features: Secondary | ICD-10-CM | POA: Diagnosis not present

## 2017-08-18 DIAGNOSIS — Z1231 Encounter for screening mammogram for malignant neoplasm of breast: Secondary | ICD-10-CM | POA: Diagnosis not present

## 2017-08-18 DIAGNOSIS — Z9289 Personal history of other medical treatment: Secondary | ICD-10-CM | POA: Diagnosis not present

## 2017-08-30 DIAGNOSIS — F410 Panic disorder [episodic paroxysmal anxiety] without agoraphobia: Secondary | ICD-10-CM | POA: Diagnosis not present

## 2017-08-30 DIAGNOSIS — F332 Major depressive disorder, recurrent severe without psychotic features: Secondary | ICD-10-CM | POA: Diagnosis not present

## 2017-08-31 DIAGNOSIS — M79672 Pain in left foot: Secondary | ICD-10-CM | POA: Diagnosis not present

## 2017-08-31 DIAGNOSIS — M19072 Primary osteoarthritis, left ankle and foot: Secondary | ICD-10-CM | POA: Diagnosis not present

## 2017-08-31 DIAGNOSIS — M722 Plantar fascial fibromatosis: Secondary | ICD-10-CM | POA: Diagnosis not present

## 2017-09-20 DIAGNOSIS — F332 Major depressive disorder, recurrent severe without psychotic features: Secondary | ICD-10-CM | POA: Diagnosis not present

## 2017-09-20 DIAGNOSIS — F410 Panic disorder [episodic paroxysmal anxiety] without agoraphobia: Secondary | ICD-10-CM | POA: Diagnosis not present

## 2017-10-06 DIAGNOSIS — F410 Panic disorder [episodic paroxysmal anxiety] without agoraphobia: Secondary | ICD-10-CM | POA: Diagnosis not present

## 2017-10-06 DIAGNOSIS — F332 Major depressive disorder, recurrent severe without psychotic features: Secondary | ICD-10-CM | POA: Diagnosis not present

## 2017-10-27 DIAGNOSIS — F332 Major depressive disorder, recurrent severe without psychotic features: Secondary | ICD-10-CM | POA: Diagnosis not present

## 2017-10-27 DIAGNOSIS — F410 Panic disorder [episodic paroxysmal anxiety] without agoraphobia: Secondary | ICD-10-CM | POA: Diagnosis not present

## 2017-11-04 DIAGNOSIS — F332 Major depressive disorder, recurrent severe without psychotic features: Secondary | ICD-10-CM | POA: Diagnosis not present

## 2017-11-04 DIAGNOSIS — F410 Panic disorder [episodic paroxysmal anxiety] without agoraphobia: Secondary | ICD-10-CM | POA: Diagnosis not present

## 2017-11-04 DIAGNOSIS — F5105 Insomnia due to other mental disorder: Secondary | ICD-10-CM | POA: Diagnosis not present

## 2017-11-18 DIAGNOSIS — F410 Panic disorder [episodic paroxysmal anxiety] without agoraphobia: Secondary | ICD-10-CM | POA: Diagnosis not present

## 2017-11-18 DIAGNOSIS — F332 Major depressive disorder, recurrent severe without psychotic features: Secondary | ICD-10-CM | POA: Diagnosis not present

## 2017-12-06 DIAGNOSIS — F410 Panic disorder [episodic paroxysmal anxiety] without agoraphobia: Secondary | ICD-10-CM | POA: Diagnosis not present

## 2017-12-06 DIAGNOSIS — F332 Major depressive disorder, recurrent severe without psychotic features: Secondary | ICD-10-CM | POA: Diagnosis not present

## 2017-12-16 DIAGNOSIS — H43812 Vitreous degeneration, left eye: Secondary | ICD-10-CM | POA: Diagnosis not present

## 2017-12-20 DIAGNOSIS — F332 Major depressive disorder, recurrent severe without psychotic features: Secondary | ICD-10-CM | POA: Diagnosis not present

## 2017-12-20 DIAGNOSIS — F410 Panic disorder [episodic paroxysmal anxiety] without agoraphobia: Secondary | ICD-10-CM | POA: Diagnosis not present

## 2017-12-27 ENCOUNTER — Other Ambulatory Visit: Payer: Self-pay

## 2017-12-27 ENCOUNTER — Emergency Department
Admission: EM | Admit: 2017-12-27 | Discharge: 2017-12-27 | Disposition: A | Discharge status: Home / Self Care | Payer: PPO | Attending: Emergency Medicine | Admitting: Emergency Medicine

## 2017-12-27 ENCOUNTER — Encounter: Payer: Self-pay | Admitting: Emergency Medicine

## 2017-12-27 ENCOUNTER — Emergency Department: Payer: PPO

## 2017-12-27 DIAGNOSIS — A599 Trichomoniasis, unspecified: Secondary | ICD-10-CM | POA: Insufficient documentation

## 2017-12-27 DIAGNOSIS — I252 Old myocardial infarction: Secondary | ICD-10-CM | POA: Insufficient documentation

## 2017-12-27 DIAGNOSIS — R109 Unspecified abdominal pain: Secondary | ICD-10-CM | POA: Diagnosis not present

## 2017-12-27 DIAGNOSIS — Z96652 Presence of left artificial knee joint: Secondary | ICD-10-CM | POA: Insufficient documentation

## 2017-12-27 DIAGNOSIS — N939 Abnormal uterine and vaginal bleeding, unspecified: Secondary | ICD-10-CM

## 2017-12-27 DIAGNOSIS — Z79899 Other long term (current) drug therapy: Secondary | ICD-10-CM | POA: Insufficient documentation

## 2017-12-27 DIAGNOSIS — I10 Essential (primary) hypertension: Secondary | ICD-10-CM | POA: Insufficient documentation

## 2017-12-27 DIAGNOSIS — I2510 Atherosclerotic heart disease of native coronary artery without angina pectoris: Secondary | ICD-10-CM | POA: Insufficient documentation

## 2017-12-27 DIAGNOSIS — Z7982 Long term (current) use of aspirin: Secondary | ICD-10-CM | POA: Insufficient documentation

## 2017-12-27 DIAGNOSIS — R1031 Right lower quadrant pain: Secondary | ICD-10-CM | POA: Insufficient documentation

## 2017-12-27 DIAGNOSIS — Z8673 Personal history of transient ischemic attack (TIA), and cerebral infarction without residual deficits: Secondary | ICD-10-CM | POA: Diagnosis not present

## 2017-12-27 DIAGNOSIS — Z87891 Personal history of nicotine dependence: Secondary | ICD-10-CM | POA: Insufficient documentation

## 2017-12-27 DIAGNOSIS — N938 Other specified abnormal uterine and vaginal bleeding: Secondary | ICD-10-CM | POA: Diagnosis present

## 2017-12-27 LAB — CBC
HEMATOCRIT: 40.5 % (ref 35.0–47.0)
HEMOGLOBIN: 13.9 g/dL (ref 12.0–16.0)
MCH: 31.8 pg (ref 26.0–34.0)
MCHC: 34.2 g/dL (ref 32.0–36.0)
MCV: 93.1 fL (ref 80.0–100.0)
Platelets: 299 10*3/uL (ref 150–440)
RBC: 4.35 MIL/uL (ref 3.80–5.20)
RDW: 13.5 % (ref 11.5–14.5)
WBC: 10.5 10*3/uL (ref 3.6–11.0)

## 2017-12-27 LAB — COMPREHENSIVE METABOLIC PANEL
ALK PHOS: 110 U/L (ref 38–126)
ALT: 21 U/L (ref 14–54)
ANION GAP: 11 (ref 5–15)
AST: 20 U/L (ref 15–41)
Albumin: 4.4 g/dL (ref 3.5–5.0)
BILIRUBIN TOTAL: 0.6 mg/dL (ref 0.3–1.2)
BUN: 23 mg/dL — AB (ref 6–20)
CALCIUM: 9.6 mg/dL (ref 8.9–10.3)
CO2: 28 mmol/L (ref 22–32)
Chloride: 97 mmol/L — ABNORMAL LOW (ref 101–111)
Creatinine, Ser: 0.84 mg/dL (ref 0.44–1.00)
GFR calc Af Amer: >60 mL/min (ref >60)
GLUCOSE: 96 mg/dL (ref 65–99)
POTASSIUM: 3.3 mmol/L — AB (ref 3.5–5.1)
Sodium: 136 mmol/L (ref 135–145)
TOTAL PROTEIN: 8.2 g/dL — AB (ref 6.5–8.1)

## 2017-12-27 LAB — URINALYSIS, COMPLETE (UACMP) WITH MICROSCOPIC
Bilirubin Urine: NEGATIVE
GLUCOSE, UA: NEGATIVE mg/dL
Ketones, ur: NEGATIVE mg/dL
NITRITE: NEGATIVE
PH: 5 (ref 5.0–8.0)
Protein, ur: 100 mg/dL — AB
Specific Gravity, Urine: 1.024 (ref 1.005–1.030)

## 2017-12-27 LAB — WET PREP, GENITAL
CLUE CELLS WET PREP: NONE SEEN
SPERM: NONE SEEN
Yeast Wet Prep HPF POC: NONE SEEN

## 2017-12-27 LAB — CHLAMYDIA/NGC RT PCR (ARMC ONLY)
CHLAMYDIA TR: DETECTED — AB
N GONORRHOEAE: NOT DETECTED

## 2017-12-27 LAB — LIPASE, BLOOD: Lipase: 39 U/L (ref 11–51)

## 2017-12-27 MED ORDER — IOPAMIDOL (ISOVUE-300) INJECTION 61%
30.0000 mL | Freq: Once | INTRAVENOUS | Status: AC | PRN
  Administered 2017-12-27: 30 mL via ORAL

## 2017-12-27 MED ORDER — METRONIDAZOLE 500 MG PO TABS
2000.0000 mg | ORAL_TABLET | Freq: Once | ORAL | Status: AC
  Administered 2017-12-27: 2000 mg via ORAL
  Filled 2017-12-27: qty 4

## 2017-12-27 MED ORDER — IOPAMIDOL (ISOVUE-300) INJECTION 61%
100.0000 mL | Freq: Once | INTRAVENOUS | Status: AC | PRN
  Administered 2017-12-27: 100 mL via INTRAVENOUS

## 2017-12-27 MED ORDER — CEFTRIAXONE SODIUM 250 MG IJ SOLR
250.0000 mg | Freq: Once | INTRAMUSCULAR | Status: AC
  Administered 2017-12-27: 250 mg via INTRAMUSCULAR
  Filled 2017-12-27: qty 250

## 2017-12-27 MED ORDER — METRONIDAZOLE 500 MG PO TABS: 500.0000 mg | ORAL_TABLET | Freq: Once | ORAL | Status: DC

## 2017-12-27 MED ORDER — DOXYCYCLINE HYCLATE 100 MG PO TABS
100.0000 mg | ORAL_TABLET | Freq: Once | ORAL | Status: AC
  Administered 2017-12-27: 100 mg via ORAL
  Filled 2017-12-27: qty 1

## 2017-12-27 NOTE — ED Provider Notes (Signed)
[facility] Emergency Department Provider Note   ____________________________________________   First MD Initiated Contact with Patient 12/27/17 1208     (approximate)  I have reviewed the triage vital signs and the nursing notes.   HISTORY  Chief Complaint Vaginal Bleeding and Abdominal Pain    HPI [name] is a 62 y.o. female who reports she thought she had a yeast infection put an applicator in her vagina to get some anti-yeast cream up there when she took it out with some blood on the and insides of the applicator. She hasn't had any bleeding since. She reports she had an ablation years ago with Dr. Davis Gourd has not had any menstrual period since then. She also has had some right lower quadrant pain since Friday. It's worse when she sits or pushes on her belly. It is just a dull bothersome achy pain deep inside. She reports she had that ovary removed years ago.she also had a heart catheter done in that area 3 times. She does have coronary artery disease as noted past medical history  Past Medical History:  Diagnosis Date  . Anxiety   . Arthritis   . CAD (coronary artery disease)    a. 03/2013 NSTEMI/Cath: 100 RCA, EF 45%->Med Rx;  b. 12/2013 Cath/PCI: LAD 16m, 30d, D1 80, D2 60, LCX nl, RCA 50ost/p, 83m, 95d (2.5x33 Xience DES);  c. 05/2014 Lexi MV: EF 68%, no ischemia; d. stress echo 12/2015 no ischemia @ max exercise (did not achieve target)  . Carotid disease, bilateral ([place])    a. 08/2013 Carotid U/S: 1-39% bilat ICA stenosis.  . Complication of anesthesia   . CVA (cerebral vascular accident) ([place])    a. 08/2013.  [name]   . Depression   . GERD (gastroesophageal reflux disease)   . History of recurrent UTIs   . Hypercholesteremia   . Hyperlipemia   . Hypertension   . Insomnia   . Ischemic cardiomyopathy    a. 03/2013 Echo: EF 30-35%, mod dil LA, mod-sev MR, mod TR;  b. 08/2013 Echo EF 60-65%, mild AI.  Marland Kitchen Menopause   .  Migraines   . Myocardial infarction ([place])   . PONV (postoperative nausea and vomiting)    If anesthsia is given slowly, pt will not throw up, if given quickly, pt will have nausea and vomiting.  . Right lower quadrant pain   . Syncope and collapse   . Tobacco abuse   . Vaginal atrophy     Patient Active Problem List   Diagnosis Date Noted  . Status post total knee replacement using cement, left 10/06/2016  . History of coronary artery stent placement   . Pure hypercholesterolemia   . Recurrent UTI 02/11/2016  . Menopause 02/11/2016  . Angina pectoris ([place]) 01/07/2016  . Syncope 07/11/2015  . Orthostatic hypotension 02/19/2015  . Dehydration 02/19/2015  . Hypokalemia 02/19/2015  . Stroke ([place]) 02/18/2015  . Tobacco abuse 02/18/2015  . History of stroke 07/18/2014  . Vertigo 07/18/2014  . Bilateral wrist pain 07/18/2014  . Atypical chest pain 06/05/2014  . CAD (coronary artery disease) 01/19/2014  . Carotid arterial disease ([place]) 01/19/2014  . Depression 01/16/2014  . Memory loss 01/16/2014  . Right sided weakness 08/10/2013  . Hypertension 08/10/2013  . Hyperlipidemia 08/10/2013  . History of MI (myocardial infarction) 08/10/2013  . Stroke, acute, embolic ([place]) 19/37/9024    Past Surgical History:  Procedure Laterality Date  . CARDIAC CATHETERIZATION  01/01/2014  . CARDIAC  CATHETERIZATION  03/16/2013  . CARDIAC CATHETERIZATION  12/2013  . CARDIAC CATHETERIZATION N/A 08/19/2016   Procedure: Left Heart Cath and Coronary Angiography;  Surgeon: Minna Merritts, MD;  Location: [facility];  Service: Cardiovascular;  Laterality: N/A;  . CESAREAN SECTION WITH BILATERAL TUBAL LIGATION    . CORONARY ANGIOPLASTY WITH STENT PLACEMENT  01/01/2014   95% lesion with a drug eluting stent to the distal RCA.  Marland Kitchen ENDOMETRIAL ABLATION    . KNEE ARTHROSCOPY  11/27/2006   left knee   . OOPHORECTOMY    . TOTAL KNEE ARTHROPLASTY Left 10/06/2016   Procedure: TOTAL KNEE  ARTHROPLASTY;  Surgeon: Corky Mull, MD;  Location: [facility];  Service: Orthopedics;  Laterality: Left;  . TUBAL LIGATION      Prior to Admission medications   Medication Sig Start Date End Date Taking? Authorizing Provider  acetaminophen (TYLENOL) 500 MG tablet Take 500 mg by mouth every 6 (six) hours as needed for mild pain or moderate pain.     [provider]  Alirocumab (PRALUENT) 75 MG/ML SOPN Inject 1 pen into the skin every 14 (fourteen) days. 09/29/16   Minna Merritts, MD  aspirin 81 MG tablet Take 81 mg by mouth daily.    [provider]  atorvastatin (LIPITOR) 80 MG tablet Take 1 tablet (80 mg total) by mouth at bedtime. 05/27/17   Minna Merritts, MD  Cholecalciferol (VITAMIN D PO) Take 1 capsule by mouth daily.    [provider]  DULoxetine (CYMBALTA) 30 MG capsule Take 90 mg by mouth daily. 09/04/16   [provider]  ezetimibe (ZETIA) 10 MG tablet Take 1 tablet (10 mg total) by mouth daily. 05/27/17   Minna Merritts, MD  furosemide (LASIX) 20 MG tablet Take 20 mg by mouth daily as needed for fluid or edema.  01/20/16   [provider]  hydrOXYzine (ATARAX/VISTARIL) 25 MG tablet Take 25mg s twice daily as needed for anxiety 01/27/16   [provider]  metoprolol succinate (TOPROL-XL) 25 MG 24 hr tablet Take 25 mg by mouth every morning.     [provider]  nitrofurantoin, macrocrystal-monohydrate, (MACROBID) 100 MG capsule Take 1 capsule (100 mg total) by mouth at bedtime. 02/17/17   Defrancesco, Alanda Slim, MD  nitroGLYCERIN (NITROSTAT) 0.4 MG SL tablet Place 1 tablet (0.4 mg total) under the tongue every 5 (five) minutes as needed for chest pain. 06/03/15   Minna Merritts, MD  pantoprazole (PROTONIX) 40 MG tablet Take 40 mg by mouth at bedtime.     [provider]  potassium chloride SA (K-DUR,KLOR-CON) 20 MEQ tablet Take 1 tablet (20 mEq total) by mouth daily as needed. 05/27/17   Minna Merritts, MD    sertraline (ZOLOFT) 100 MG tablet Take 200 mg by mouth daily.     [provider]  triamterene-hydrochlorothiazide (DYAZIDE) 37.5-25 MG capsule TAKE 1 TABLET BY MOUTH DAILY 01/10/16   [provider]  zolpidem (AMBIEN) 10 MG tablet Take 10 mg by mouth at bedtime as needed for sleep.    [provider]    Allergies Codeine sulfate and Penicillins  Family History  Problem Relation Age of Onset  . Hypertension Mother   . Stroke Mother   . Hyperlipidemia Mother   . Breast cancer Paternal Grandmother   . Colon cancer Neg Hx   . Ovarian cancer Neg Hx   . Heart disease Neg Hx   . Diabetes Neg Hx  Social History Social History   Tobacco Use  . Smoking status: Former Smoker    Packs/day: 0.25    Years: 20.00    Pack years: 5.00    Types: Cigarettes    Last attempt to quit: 04/07/2013    Years since quitting: 4.7  . Smokeless tobacco: Never Used  Substance Use Topics  . Alcohol use: Yes    Comment: 1 glass of wine a month  . Drug use: No    Review of Systems  Constitutional: No fever/chills Eyes: No visual changes. ENT: No sore throat. Cardiovascular: Denies chest pain. Respiratory: Denies shortness of breath. Gastrointestinal:right lower quadrant abdominal pain.  No nausea, no vomiting.  No diarrhea.  No constipation. Genitourinary: Negative for dysuria. Musculoskeletal: Negative for back pain. Skin: Negative for rash. Neurological: Negative for headaches, focal weakness   ____________________________________________   PHYSICAL EXAM:  VITAL SIGNS: ED Triage Vitals  Enc Vitals Group     BP 12/27/17 1116 133/79     Pulse Rate 12/27/17 1116 70     Resp 12/27/17 1116 16     Temp 12/27/17 1116 97.6 F (36.4 C)     Temp Source 12/27/17 1116 Oral     SpO2 12/27/17 1116 100 %     Weight 12/27/17 1117 139 lb (63 kg)     Height --      Head Circumference --      Peak Flow --      Pain Score 12/27/17 1117 6     Pain Loc --      Pain  Edu? --      Excl. in [place]? --     Constitutional: Alert and oriented. Well appearing and in no acute distress. Eyes: Conjunctivae are normal.  Head: Atraumatic. Nose: No congestion/rhinnorhea. Mouth/Throat: Mucous membranes are moist.  Oropharynx non-erythematous. Neck: No stridor.  Cardiovascular: Normal rate, regular rhythm. Grossly normal heart sounds.  Good peripheral circulation. Respiratory: Normal respiratory effort.  No retractions. Lungs CTAB. Gastrointestinal: Soft and nontenderexcept palpation in the right lower quadrant.. No distention. No abdominal bruits. No CVA tenderness. Genitourinary: normal perineum vagina does not look like it is atrophied. There is a thick whitish greenish discharge present. I cannot palpate any masses noted does not appear to be any tenderness on bimanual. Musculoskeletal: No lower extremity tenderness nor edema.  No joint effusions. Neurologic:  Normal speech and language. No gross focal neurologic deficits are appreciated. No gait instability. Skin:  Skin is warm, dry and intact. No rash noted. Psychiatric: Mood and affect are normal. Speech and behavior are normal.  ____________________________________________   LABS (all labs ordered are listed, but only abnormal results are displayed)  Labs Reviewed  WET PREP, GENITAL - Abnormal; Notable for the following components:      Result Value   Trich, Wet Prep PRESENT (*)    WBC, Wet Prep HPF POC MANY (*)    All other components within normal limits  CHLAMYDIA/NGC RT PCR ([facility] ONLY) - Abnormal; Notable for the following components:   Chlamydia Tr DETECTED (*)    All other components within normal limits  COMPREHENSIVE METABOLIC PANEL - Abnormal; Notable for the following components:   Potassium 3.3 (*)    Chloride 97 (*)    BUN 23 (*)    Total Protein 8.2 (*)    All other components within normal limits  URINALYSIS, COMPLETE (UACMP) WITH MICROSCOPIC - Abnormal; Notable for the  following components:   Color, Urine AMBER (*)    APPearance  TURBID (*)    Hgb urine dipstick SMALL (*)    Protein, ur 100 (*)    Leukocytes, UA MODERATE (*)    Bacteria, UA FEW (*)    Squamous Epithelial / LPF 6-30 (*)    All other components within normal limits  LIPASE, BLOOD  CBC   ____________________________________________  EKG   ____________________________________________  RADIOLOGY  ED MD interpretation:   Official radiology report(s): Ct Abdomen Pelvis W Contrast  Result Date: 12/27/2017 CLINICAL DATA:  Right lower quadrant abdominal pain and vaginal bleeding over the last 3 days. History of uterine ablation and right oophorectomy. EXAM: CT ABDOMEN AND PELVIS WITH CONTRAST TECHNIQUE: Multidetector CT imaging of the abdomen and pelvis was performed using the standard protocol following bolus administration of intravenous contrast. CONTRAST:  149mL ISOVUE-300 IOPAMIDOL (ISOVUE-300) INJECTION 61% COMPARISON:  Ultrasound 07/03/2011.  CT 08/28/2005. FINDINGS: Lower chest: Normal Hepatobiliary: Mild diffuse fatty change of the liver. No focal lesion. No calcified gallstones. Pancreas: Normal Spleen: Normal Adrenals/Urinary Tract: Adrenal glands are normal. Kidneys are normal. No cyst, mass, stone or hydronephrosis. No passing stone. No bladder abnormality. Stomach/Bowel: Normal appearing appendix. Moderate amount of fecal matter in the colon, but within normal limits. No small bowel abnormality seen. No rectosigmoid abnormality seen. Vascular/Lymphatic: Aortic atherosclerosis. No aneurysm. IVC is normal. No retroperitoneal adenopathy. Reproductive: No uterine mass identified. Normal appearing left ovary. No right ovary identified. Other: No free fluid or air. Musculoskeletal: Lower lumbar degenerative disc disease and degenerative facet arthropathy. IMPRESSION: No cause of the clinical presentation is identified. Normal appearing appendix. No visible abnormality of the uterus or left  ovary. Right ovary surgically absent. Mild fatty change of the liver. Aortic atherosclerosis. Lower lumbar degenerative disc disease and degenerative facet disease. Electronically Signed   By: Nelson Chimes M.D.   On: 12/27/2017 14:38    ____________________________________________   PROCEDURES  Procedure(s) performed:   Procedures  Critical Care performed:   ____________________________________________   INITIAL IMPRESSION / ASSESSMENT AND PLAN / ED COURSE   discussed with patient she really doesn't want an vaginal ultrasound she sees Dr. Enzo Bi. I think we can let her follow-up with him for the bleeding. However the right lower quadrant pain I will evaluate with CT.       ____________________________________________   FINAL CLINICAL IMPRESSION(S) / ED DIAGNOSES  Final diagnoses:  Vaginal bleeding  Trichimoniasis  Right lower quadrant abdominal pain     ED Discharge Orders    None       Note:  This document was prepared using Dragon voice recognition software and may include unintentional dictation errors.    Nena Polio, MD 12/27/17 2011

## 2017-12-27 NOTE — ED Notes (Addendum)
Pt c/o vaginal spotting of blood that started Friday - she is also having some pain in right lower abd quad with position changes and manipulation of the area- pt states she had an ablation years ago due to abn bleeding and has not had a period in years

## 2017-12-27 NOTE — Discharge Instructions (Signed)
CT scan did not show any problems. The swabs that we took show that you had Trichomonas and Chlamydia. The antibiotics that you took in the emergency room should treat most of the problem. I will have you take doxycycline an antibiotic twice a day for 2 weeks to make sure that you are completely better Please follow-up with Dr. Enzo Bi your OB/GYN doctor for the bleeding that you experienced and to make sure that we have killed all the germs.

## 2017-12-27 NOTE — ED Triage Notes (Signed)
Pt to ed with c/o vaginal bleeding since Friday and abd pain since Friday.

## 2017-12-27 NOTE — ED Notes (Signed)
Patient transported to CT 

## 2018-01-03 ENCOUNTER — Telehealth: Payer: Self-pay | Admitting: Emergency Medicine

## 2018-01-03 NOTE — Telephone Encounter (Signed)
Called patient to make sure she continued to take doxycycline after discharge from ED.  Also would like to inform her of positive chlamydia in case she was not aware and inform of need for partner treatment.  Left message asking her to call me.

## 2018-01-04 DIAGNOSIS — F332 Major depressive disorder, recurrent severe without psychotic features: Secondary | ICD-10-CM | POA: Diagnosis not present

## 2018-01-04 DIAGNOSIS — F410 Panic disorder [episodic paroxysmal anxiety] without agoraphobia: Secondary | ICD-10-CM | POA: Diagnosis not present

## 2018-01-25 DIAGNOSIS — F410 Panic disorder [episodic paroxysmal anxiety] without agoraphobia: Secondary | ICD-10-CM | POA: Diagnosis not present

## 2018-01-25 DIAGNOSIS — F332 Major depressive disorder, recurrent severe without psychotic features: Secondary | ICD-10-CM | POA: Diagnosis not present

## 2018-01-27 DIAGNOSIS — M5442 Lumbago with sciatica, left side: Secondary | ICD-10-CM | POA: Diagnosis not present

## 2018-01-27 DIAGNOSIS — R8290 Unspecified abnormal findings in urine: Secondary | ICD-10-CM | POA: Diagnosis not present

## 2018-01-27 DIAGNOSIS — A599 Trichomoniasis, unspecified: Secondary | ICD-10-CM | POA: Diagnosis not present

## 2018-01-27 DIAGNOSIS — R102 Pelvic and perineal pain: Secondary | ICD-10-CM | POA: Diagnosis not present

## 2018-01-27 DIAGNOSIS — N898 Other specified noninflammatory disorders of vagina: Secondary | ICD-10-CM | POA: Diagnosis not present

## 2018-01-28 IMAGING — CR DG CHEST 2V
1 series · 2 of 2 positions shown · non-contrast
Comparison: 06/19/2016

CLINICAL DATA: Preop respiratory exam for total knee replacement.
Previous myocardial infarct and stroke. Hypertension.

EXAM:
CHEST  2 VIEW

[Series 1: dg chest 2 view · 0.14mm/px · 2 of 2 slices shown]
[im 1/2]
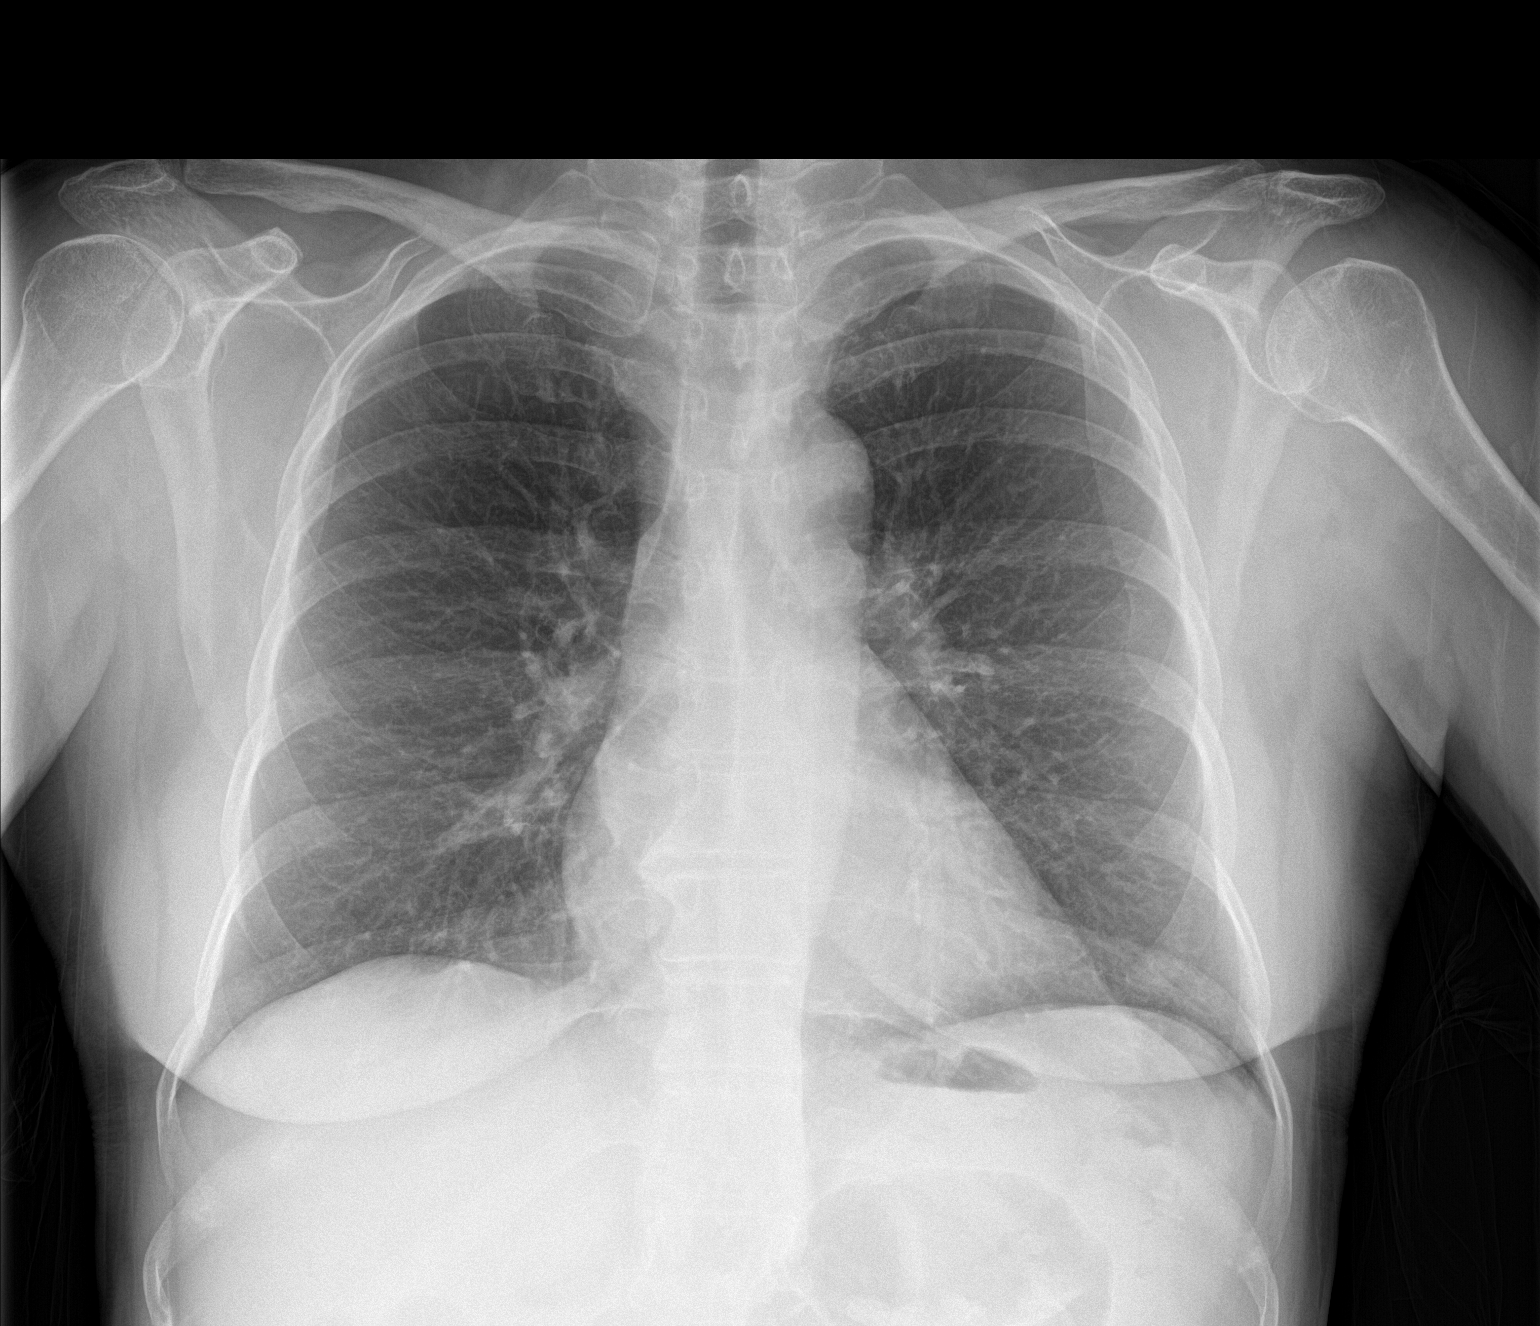
[im 2/2]
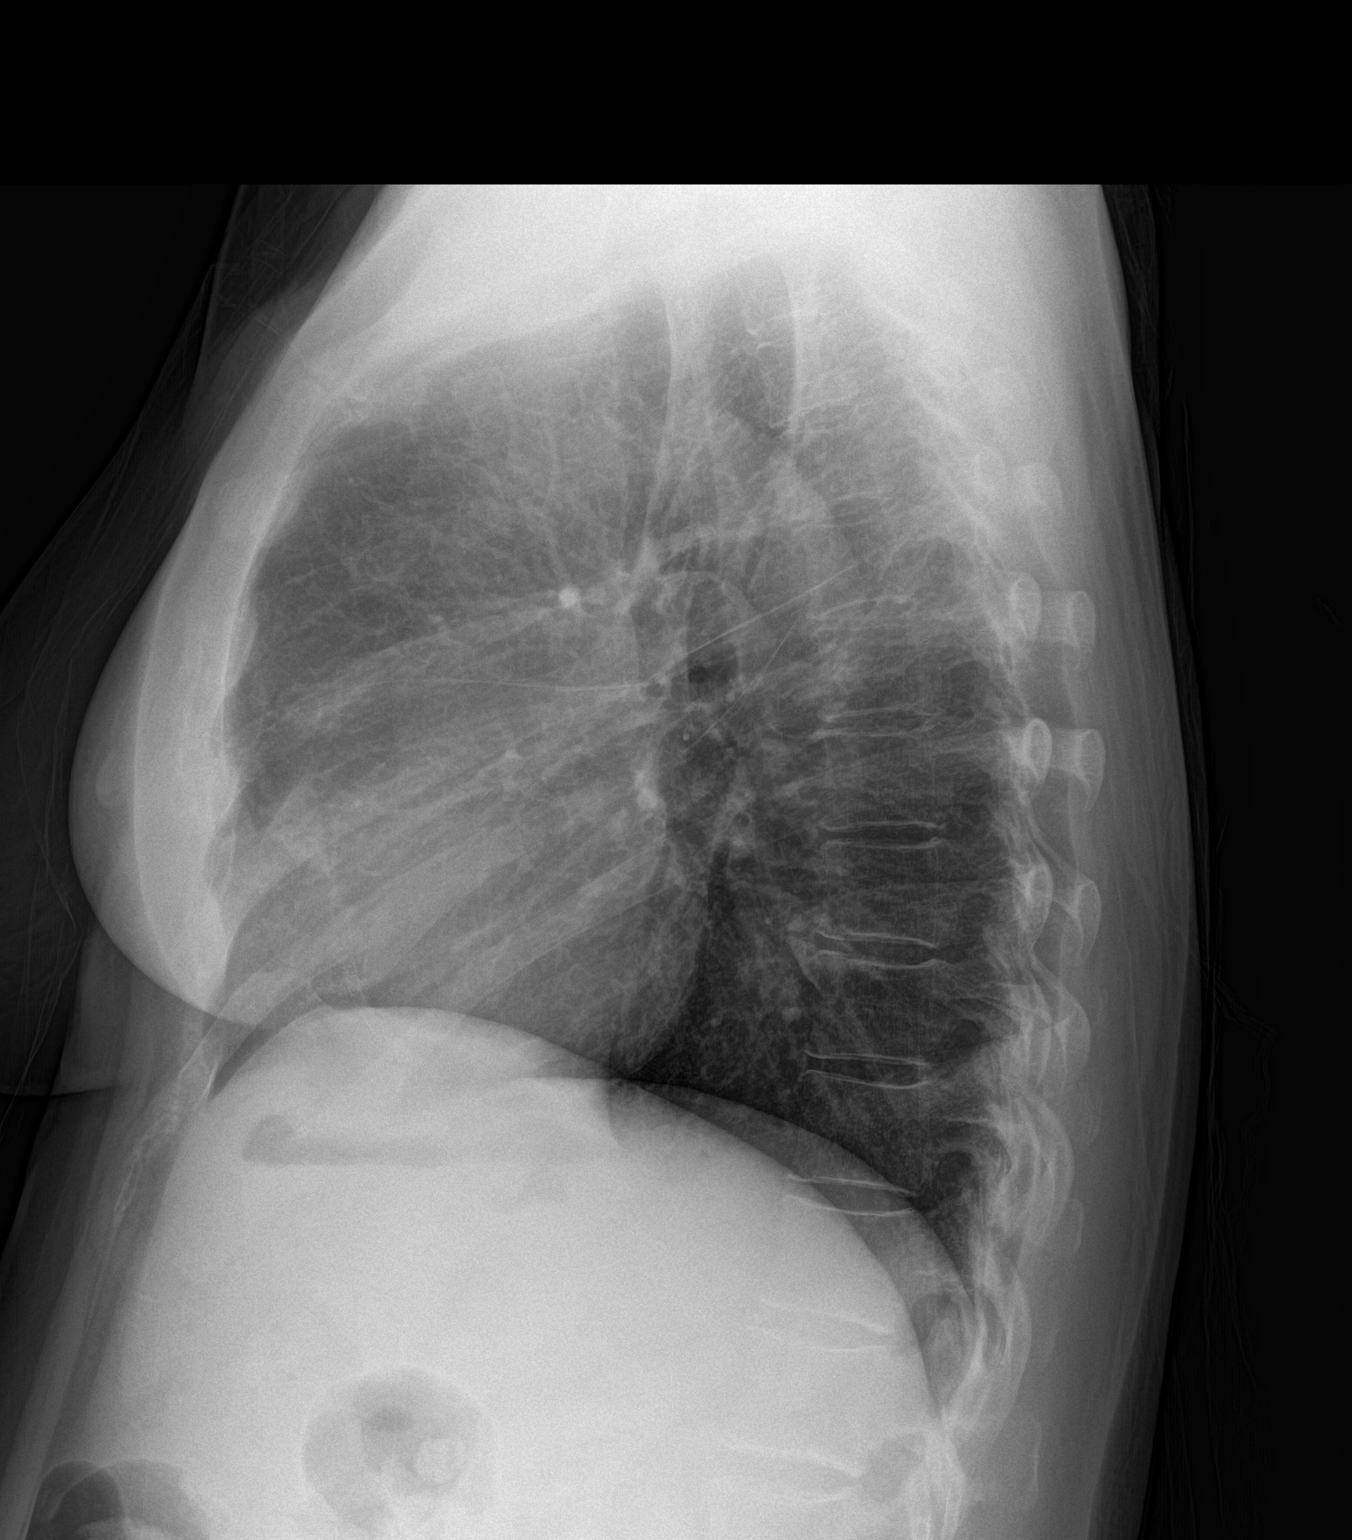

[2 of 2 positions shown; findings below may reference images not displayed]

FINDINGS: The heart size and mediastinal contours are within normal limits.
Both lungs are clear. The visualized skeletal structures are
unremarkable.
IMPRESSION: No active cardiopulmonary disease.

## 2018-02-01 DIAGNOSIS — F5105 Insomnia due to other mental disorder: Secondary | ICD-10-CM | POA: Diagnosis not present

## 2018-02-01 DIAGNOSIS — F410 Panic disorder [episodic paroxysmal anxiety] without agoraphobia: Secondary | ICD-10-CM | POA: Diagnosis not present

## 2018-02-01 DIAGNOSIS — F332 Major depressive disorder, recurrent severe without psychotic features: Secondary | ICD-10-CM | POA: Diagnosis not present

## 2018-02-08 DIAGNOSIS — I214 Non-ST elevation (NSTEMI) myocardial infarction: Secondary | ICD-10-CM | POA: Diagnosis not present

## 2018-02-08 DIAGNOSIS — E7800 Pure hypercholesterolemia, unspecified: Secondary | ICD-10-CM | POA: Diagnosis not present

## 2018-02-08 DIAGNOSIS — I10 Essential (primary) hypertension: Secondary | ICD-10-CM | POA: Diagnosis not present

## 2018-02-14 DIAGNOSIS — F410 Panic disorder [episodic paroxysmal anxiety] without agoraphobia: Secondary | ICD-10-CM | POA: Diagnosis not present

## 2018-02-14 DIAGNOSIS — F332 Major depressive disorder, recurrent severe without psychotic features: Secondary | ICD-10-CM | POA: Diagnosis not present

## 2018-02-14 DIAGNOSIS — F5105 Insomnia due to other mental disorder: Secondary | ICD-10-CM | POA: Diagnosis not present

## 2018-02-15 DIAGNOSIS — F329 Major depressive disorder, single episode, unspecified: Secondary | ICD-10-CM | POA: Diagnosis not present

## 2018-02-15 DIAGNOSIS — I10 Essential (primary) hypertension: Secondary | ICD-10-CM | POA: Diagnosis not present

## 2018-02-15 DIAGNOSIS — E7800 Pure hypercholesterolemia, unspecified: Secondary | ICD-10-CM | POA: Diagnosis not present

## 2018-02-15 DIAGNOSIS — I639 Cerebral infarction, unspecified: Secondary | ICD-10-CM | POA: Diagnosis not present

## 2018-02-15 DIAGNOSIS — I214 Non-ST elevation (NSTEMI) myocardial infarction: Secondary | ICD-10-CM | POA: Diagnosis not present

## 2018-02-15 DIAGNOSIS — Z114 Encounter for screening for human immunodeficiency virus [HIV]: Secondary | ICD-10-CM | POA: Diagnosis not present

## 2018-02-19 DIAGNOSIS — Z113 Encounter for screening for infections with a predominantly sexual mode of transmission: Secondary | ICD-10-CM | POA: Diagnosis not present

## 2018-02-19 DIAGNOSIS — N760 Acute vaginitis: Secondary | ICD-10-CM | POA: Diagnosis not present

## 2018-02-19 DIAGNOSIS — B9689 Other specified bacterial agents as the cause of diseases classified elsewhere: Secondary | ICD-10-CM | POA: Diagnosis not present

## 2018-02-21 NOTE — Progress Notes (Signed)
Patient ID: [name], female   DOB: 1956-04-07, 62 y.o.   MRN: [id number] ANNUAL PREVENTATIVE CARE GYN  ENCOUNTER NOTE  Subjective:       [name] is a 62 y.o. G77P1001 female here for a routine annual gynecologic exam.  Current complaints: 1.  none   Patient is postmenopausal. She is not experiencing any significant vasomotor symptoms. Bowel function and bladder function are normal. She is supplementing calcium with vitamin D daily.  She was recently treated for chlamydia, and has not had any further contact with that partner.  Dr. Ola Spurr has screened her for other STDs.  She is not experiencing any vaginal discharge, vaginal drainage, or abnormal uterine bleeding.  She states that she no longer is going to have sex. She has experienced dyspareunia with insertion, but states that this will no longer be a problem.   Gynecologic History No LMP recorded. Patient has had an ablation. Contraception: none Last Pap: 02/2017 neg/neg. Results were: normal Last mammogram: 02/24/2017 birad 1. Results were: normal  Obstetric History OB History  Gravida Para Term Preterm AB Living  1 1 1     1   SAB TAB Ectopic Multiple Live Births          1    # Outcome Date GA Lbr Len/2nd Weight Sex Delivery Anes PTL Lv  1 Term      CS-LTranv   LIV    Past Medical History:  Diagnosis Date  . Anxiety   . Arthritis   . CAD (coronary artery disease)    a. 03/2013 NSTEMI/Cath: 100 RCA, EF 45%->Med Rx;  b. 12/2013 Cath/PCI: LAD 69m, 30d, D1 80, D2 60, LCX nl, RCA 50ost/p, 29m, 95d (2.5x33 Xience DES);  c. 05/2014 Lexi MV: EF 68%, no ischemia; d. stress echo 12/2015 no ischemia @ max exercise (did not achieve target)  . Carotid disease, bilateral ([place])    a. 08/2013 Carotid U/S: 1-39% bilat ICA stenosis.  . Complication of anesthesia   . CVA (cerebral vascular accident) ([place])    a. 08/2013.  [name]   . Depression   . GERD (gastroesophageal reflux disease)   . History of recurrent UTIs    . Hypercholesteremia   . Hyperlipemia   . Hypertension   . Insomnia   . Ischemic cardiomyopathy    a. 03/2013 Echo: EF 30-35%, mod dil LA, mod-sev MR, mod TR;  b. 08/2013 Echo EF 60-65%, mild AI.  Marland Kitchen Menopause   . Migraines   . Myocardial infarction ([place])   . PONV (postoperative nausea and vomiting)    If anesthsia is given slowly, pt will not throw up, if given quickly, pt will have nausea and vomiting.  . Right lower quadrant pain   . Syncope and collapse   . Tobacco abuse   . Vaginal atrophy     Past Surgical History:  Procedure Laterality Date  . CARDIAC CATHETERIZATION  01/01/2014  . CARDIAC CATHETERIZATION  03/16/2013  . CARDIAC CATHETERIZATION  12/2013  . CARDIAC CATHETERIZATION N/A 08/19/2016   Procedure: Left Heart Cath and Coronary Angiography;  Surgeon: Minna Merritts, MD;  Location: [facility];  Service: Cardiovascular;  Laterality: N/A;  . CESAREAN SECTION WITH BILATERAL TUBAL LIGATION    . CORONARY ANGIOPLASTY WITH STENT PLACEMENT  01/01/2014   95% lesion with a drug eluting stent to the distal RCA.  Marland Kitchen ENDOMETRIAL ABLATION    . KNEE ARTHROSCOPY  11/27/2006   left knee   . OOPHORECTOMY    .  TOTAL KNEE ARTHROPLASTY Left 10/06/2016   Procedure: TOTAL KNEE ARTHROPLASTY;  Surgeon: Corky Mull, MD;  Location: [facility];  Service: Orthopedics;  Laterality: Left;  . TUBAL LIGATION      Current Outpatient Medications on File Prior to Visit  Medication Sig Dispense Refill  . acetaminophen (TYLENOL) 500 MG tablet Take 500 mg by mouth every 6 (six) hours as needed for mild pain or moderate pain.     . Alirocumab (PRALUENT) 75 MG/ML SOPN Inject 1 pen into the skin every 14 (fourteen) days. 2 pen 11  . aspirin 81 MG tablet Take 81 mg by mouth daily.    Marland Kitchen atorvastatin (LIPITOR) 80 MG tablet Take 1 tablet (80 mg total) by mouth at bedtime. 90 tablet 4  . Cholecalciferol (VITAMIN D PO) Take 1 capsule by mouth daily.    . DULoxetine (CYMBALTA) 30 MG capsule Take 90 mg  by mouth daily.  3  . ezetimibe (ZETIA) 10 MG tablet Take 1 tablet (10 mg total) by mouth daily. 90 tablet 4  . furosemide (LASIX) 20 MG tablet Take 20 mg by mouth daily as needed for fluid or edema.     . hydrOXYzine (ATARAX/VISTARIL) 25 MG tablet Take 25mg s twice daily as needed for anxiety  3  . metoprolol succinate (TOPROL-XL) 25 MG 24 hr tablet Take 25 mg by mouth every morning.     . nitrofurantoin, macrocrystal-monohydrate, (MACROBID) 100 MG capsule Take 1 capsule (100 mg total) by mouth at bedtime. 90 capsule 4  . nitroGLYCERIN (NITROSTAT) 0.4 MG SL tablet Place 1 tablet (0.4 mg total) under the tongue every 5 (five) minutes as needed for chest pain. 25 tablet 6  . pantoprazole (PROTONIX) 40 MG tablet Take 40 mg by mouth at bedtime.     . potassium chloride SA (K-DUR,KLOR-CON) 20 MEQ tablet Take 1 tablet (20 mEq total) by mouth daily as needed. 30 tablet 3  . sertraline (ZOLOFT) 100 MG tablet Take 200 mg by mouth daily.     Marland Kitchen triamterene-hydrochlorothiazide (DYAZIDE) 37.5-25 MG capsule TAKE 1 TABLET BY MOUTH DAILY  5  . zolpidem (AMBIEN) 10 MG tablet Take 10 mg by mouth at bedtime as needed for sleep.     No current facility-administered medications on file prior to visit.     Allergies  Allergen Reactions  . Codeine Sulfate     sick  . Penicillins Nausea And Vomiting    Has patient had a PCN reaction causing immediate rash, facial/tongue/throat swelling, SOB or lightheadedness with hypotension: No Has patient had a PCN reaction causing severe rash involving mucus membranes or skin necrosis: No Has patient had a PCN reaction that required hospitalization No Has patient had a PCN reaction occurring within the last 10 years: No If all of the above answers are "NO", then may proceed with Cephalosporin use.     Social History   Socioeconomic History  . Marital status: Single    Spouse name: Not on file  . Number of children: 1  . Years of education: Not on file  . Highest  education level: Not on file  Occupational History  . Occupation: Firefighter: [employer]  . Financial resource strain: Not on file  . Food insecurity:    Worry: Not on file    Inability: Not on file  . Transportation needs:    Medical: Not on file    Non-medical: Not on file  Tobacco Use  . Smoking  status: Former Smoker    Packs/day: 0.25    Years: 20.00    Pack years: 5.00    Types: Cigarettes    Last attempt to quit: 04/07/2013    Years since quitting: 4.8  . Smokeless tobacco: Never Used  Substance and Sexual Activity  . Alcohol use: Yes    Comment: 1 glass of wine a month  . Drug use: No  . Sexual activity: Not Currently    Birth control/protection: None  Lifestyle  . Physical activity:    Days per week: Not on file    Minutes per session: Not on file  . Stress: Not on file  Relationships  . Social connections:    Talks on phone: Not on file    Gets together: Not on file    Attends religious service: Not on file    Active member of club or organization: Not on file    Attends meetings of clubs or organizations: Not on file    Relationship status: Not on file  . Intimate partner violence:    Fear of current or ex partner: Not on file    Emotionally abused: Not on file    Physically abused: Not on file    Forced sexual activity: Not on file  Other Topics Concern  . Not on file  Social History Narrative  . Not on file    Family History  Problem Relation Age of Onset  . Hypertension Mother   . Stroke Mother   . Hyperlipidemia Mother   . Breast cancer Paternal Grandmother   . Colon cancer Neg Hx   . Ovarian cancer Neg Hx   . Heart disease Neg Hx   . Diabetes Neg Hx     The following portions of the patient's history were reviewed and updated as appropriate: allergies, current medications, past family history, past medical history, past social history, past surgical history and problem list.  Review of  Systems Review of Systems  Constitutional: Negative.        No vasomotor symptoms  HENT: Negative.   Eyes: Negative.   Respiratory: Negative.   Cardiovascular: Negative.   Gastrointestinal: Negative.   Genitourinary: Negative.   Musculoskeletal: Negative.   Skin: Negative.   Neurological: Negative.   Endo/Heme/Allergies: Negative.   Psychiatric/Behavioral: Negative.       Objective:  BP (!) 149/75   Pulse 87   Ht 5\' 2"  (1.575 m)   Wt 145 lb 9.6 oz (66 kg)   BMI 26.63 kg/m  CONSTITUTIONAL: Well-developed, well-nourished female in no acute distress.  PSYCHIATRIC: Normal mood and affect. Normal behavior. Normal judgment and thought content. [place]: Alert and oriented to person, place, and time. Normal muscle tone coordination. No cranial nerve deficit noted. HENT:  Normocephalic, atraumatic, External right and left ear normal. EYES: Conjunctivae and EOM are normal.  No scleral icterus.  NECK: Normal range of motion, supple, no masses.  Normal thyroid.  SKIN: Skin is warm and dry. No rash noted. Not diaphoretic. No erythema. No pallor. CARDIOVASCULAR: Normal heart rate noted, regular rhythm, no murmur. RESPIRATORY: Clear to auscultation bilaterally. Effort and breath sounds normal, no problems with respiration noted. BREASTS: Symmetric in size. No masses, skin changes, nipple drainage, or lymphadenopathy. ABDOMEN: Soft, normal bowel sounds, no distention noted.  No tenderness, rebound or guarding.  BLADDER: Normal PELVIC:  External Genitalia: Normal  BUS: Normal  Vagina: Mild atrophic changes; tight introitus; no significant discharge  Cervix: Normal; no lesions; no cervical motion tenderness  Uterus:  Normal ; midplane, normal size shape, mobile, nontender  Adnexa: Normal; nonpalpable and nontender  RV: External Exam NormaI, No Rectal Masses and Normal Sphincter tone  MUSCULOSKELETAL: Normal range of motion. No tenderness.  No cyanosis, clubbing, or edema.  2+ distal  pulses. LYMPHATIC: No Axillary, Supraclavicular, or Inguinal Adenopathy.    Assessment:   Annual gynecologic examination 62 y.o. Contraception: none Normal BMI  Menopause, asymptomatic  History of recent chlamydia infection, treated, asymptomatic at this time Dyspareunia Vaginal atrophy    Plan:  Pap: Done today. Mammogram: Ordered Stool Guaiac Testing:  Ordered Labs: thru pcp Routine preventative health maintenance measures emphasized: Exercise/Diet/Weight control, Tobacco Warnings and Alcohol/Substance use risks Return to [place], CMA  Brayton Mars, MD  Note: This dictation was prepared with Dragon dictation along with smaller phrase technology. Any transcriptional errors that result from this process are unintentional.

## 2018-02-22 ENCOUNTER — Encounter: Payer: Self-pay | Admitting: Obstetrics and Gynecology

## 2018-02-22 ENCOUNTER — Ambulatory Visit (INDEPENDENT_AMBULATORY_CARE_PROVIDER_SITE_OTHER): Payer: PPO | Admitting: Obstetrics and Gynecology

## 2018-02-22 ENCOUNTER — Encounter: Payer: PPO | Admitting: Obstetrics and Gynecology

## 2018-02-22 VITALS — BP 149/75 | HR 87 | Ht 62.0 in | Wt 145.6 lb

## 2018-02-22 DIAGNOSIS — Z780 Asymptomatic menopausal state: Secondary | ICD-10-CM | POA: Diagnosis not present

## 2018-02-22 DIAGNOSIS — N952 Postmenopausal atrophic vaginitis: Secondary | ICD-10-CM

## 2018-02-22 DIAGNOSIS — Z8619 Personal history of other infectious and parasitic diseases: Secondary | ICD-10-CM | POA: Diagnosis not present

## 2018-02-22 DIAGNOSIS — N941 Unspecified dyspareunia: Secondary | ICD-10-CM | POA: Diagnosis not present

## 2018-02-22 DIAGNOSIS — Z01419 Encounter for gynecological examination (general) (routine) without abnormal findings: Secondary | ICD-10-CM | POA: Diagnosis not present

## 2018-02-22 DIAGNOSIS — Z1231 Encounter for screening mammogram for malignant neoplasm of breast: Secondary | ICD-10-CM

## 2018-02-22 DIAGNOSIS — Z1211 Encounter for screening for malignant neoplasm of colon: Secondary | ICD-10-CM

## 2018-02-22 DIAGNOSIS — R87610 Atypical squamous cells of undetermined significance on cytologic smear of cervix (ASC-US): Secondary | ICD-10-CM | POA: Diagnosis not present

## 2018-02-22 DIAGNOSIS — Z1239 Encounter for other screening for malignant neoplasm of breast: Secondary | ICD-10-CM

## 2018-02-22 NOTE — Patient Instructions (Addendum)
1.  Pap smear is done. 2.  Mammogram is ordered 3.  Stool guaiac cards testing for colon cancer screening is ordered 4.  Screening labs are to be obtained through primary care 5.  Continue with healthy eating and exercise 6.  Recommend calcium 1200 mg a day and vitamin D 800 international units daily 7.  Return in 1 year for annual exam   Health Maintenance for Postmenopausal Women Menopause is a normal process in which your reproductive ability comes to an end. This process happens gradually over a span of months to years, usually between the ages of 14 and 76. Menopause is complete when you have missed 12 consecutive menstrual periods. It is important to talk with your health care provider about some of the most common conditions that affect postmenopausal women, such as heart disease, cancer, and bone loss (osteoporosis). Adopting a healthy lifestyle and getting preventive care can help to promote your health and wellness. Those actions can also lower your chances of developing some of these common conditions. What should I know about menopause? During menopause, you may experience a number of symptoms, such as:  Moderate-to-severe hot flashes.  Night sweats.  Decrease in sex drive.  Mood swings.  Headaches.  Tiredness.  Irritability.  Memory problems.  Insomnia.  Choosing to treat or not to treat menopausal changes is an individual decision that you make with your health care provider. What should I know about hormone replacement therapy and supplements? Hormone therapy products are effective for treating symptoms that are associated with menopause, such as hot flashes and night sweats. Hormone replacement carries certain risks, especially as you become older. If you are thinking about using estrogen or estrogen with progestin treatments, discuss the benefits and risks with your health care provider. What should I know about heart disease and stroke? Heart disease, heart  attack, and stroke become more likely as you age. This may be due, in part, to the hormonal changes that your body experiences during menopause. These can affect how your body processes dietary fats, triglycerides, and cholesterol. Heart attack and stroke are both medical emergencies. There are many things that you can do to help prevent heart disease and stroke:  Have your blood pressure checked at least every 1-2 years. High blood pressure causes heart disease and increases the risk of stroke.  If you are 4-49 years old, ask your health care provider if you should take aspirin to prevent a heart attack or a stroke.  Do not use any tobacco products, including cigarettes, chewing tobacco, or electronic cigarettes. If you need help quitting, ask your health care provider.  It is important to eat a healthy diet and maintain a healthy weight. ? Be sure to include plenty of vegetables, fruits, low-fat dairy products, and lean protein. ? Avoid eating foods that are high in solid fats, added sugars, or salt (sodium).  Get regular exercise. This is one of the most important things that you can do for your health. ? Try to exercise for at least 150 minutes each week. The type of exercise that you do should increase your heart rate and make you sweat. This is known as moderate-intensity exercise. ? Try to do strengthening exercises at least twice each week. Do these in addition to the moderate-intensity exercise.  Know your numbers.Ask your health care provider to check your cholesterol and your blood glucose. Continue to have your blood tested as directed by your health care provider.  What should I know about cancer  screening? There are several types of cancer. Take the following steps to reduce your risk and to catch any cancer development as early as possible. Breast Cancer  Practice breast self-awareness. ? This means understanding how your breasts normally appear and feel. ? It also means  doing regular breast self-exams. Let your health care provider know about any changes, no matter how small.  If you are 69 or older, have a clinician do a breast exam (clinical breast exam or CBE) every year. Depending on your age, family history, and medical history, it may be recommended that you also have a yearly breast X-ray (mammogram).  If you have a family history of breast cancer, talk with your health care provider about genetic screening.  If you are at high risk for breast cancer, talk with your health care provider about having an MRI and a mammogram every year.  Breast cancer (BRCA) gene test is recommended for women who have family members with BRCA-related cancers. Results of the assessment will determine the need for genetic counseling and BRCA1 and for BRCA2 testing. BRCA-related cancers include these types: ? Breast. This occurs in males or females. ? Ovarian. ? Tubal. This may also be called fallopian tube cancer. ? Cancer of the abdominal or pelvic lining (peritoneal cancer). ? Prostate. ? Pancreatic.  Cervical, Uterine, and Ovarian Cancer Your health care provider may recommend that you be screened regularly for cancer of the pelvic organs. These include your ovaries, uterus, and vagina. This screening involves a pelvic exam, which includes checking for microscopic changes to the surface of your cervix (Pap test).  For women ages 21-65, health care providers may recommend a pelvic exam and a Pap test every three years. For women ages 75-65, they may recommend the Pap test and pelvic exam, combined with testing for human papilloma virus (HPV), every five years. Some types of HPV increase your risk of cervical cancer. Testing for HPV may also be done on women of any age who have unclear Pap test results.  Other health care providers may not recommend any screening for nonpregnant women who are considered low risk for pelvic cancer and have no symptoms. Ask your health care  provider if a screening pelvic exam is right for you.  If you have had past treatment for cervical cancer or a condition that could lead to cancer, you need Pap tests and screening for cancer for at least 20 years after your treatment. If Pap tests have been discontinued for you, your risk factors (such as having a new sexual partner) need to be reassessed to determine if you should start having screenings again. Some women have medical problems that increase the chance of getting cervical cancer. In these cases, your health care provider may recommend that you have screening and Pap tests more often.  If you have a family history of uterine cancer or ovarian cancer, talk with your health care provider about genetic screening.  If you have vaginal bleeding after reaching menopause, tell your health care provider.  There are currently no reliable tests available to screen for ovarian cancer.  Lung Cancer Lung cancer screening is recommended for adults 70-29 years old who are at high risk for lung cancer because of a history of smoking. A yearly low-dose CT scan of the lungs is recommended if you:  Currently smoke.  Have a history of at least 30 pack-years of smoking and you currently smoke or have quit within the past 15 years. A pack-year is smoking  an average of one pack of cigarettes per day for one year.  Yearly screening should:  Continue until it has been 15 years since you quit.  Stop if you develop a health problem that would prevent you from having lung cancer treatment.  Colorectal Cancer  This type of cancer can be detected and can often be prevented.  Routine colorectal cancer screening usually begins at age 50 and continues through age 19.  If you have risk factors for colon cancer, your health care provider may recommend that you be screened at an earlier age.  If you have a family history of colorectal cancer, talk with your health care provider about genetic  screening.  Your health care provider may also recommend using home test kits to check for hidden blood in your stool.  A small camera at the end of a tube can be used to examine your colon directly (sigmoidoscopy or colonoscopy). This is done to check for the earliest forms of colorectal cancer.  Direct examination of the colon should be repeated every 5-10 years until age 20. However, if early forms of precancerous polyps or small growths are found or if you have a family history or genetic risk for colorectal cancer, you may need to be screened more often.  Skin Cancer  Check your skin from head to toe regularly.  Monitor any moles. Be sure to tell your health care provider: ? About any new moles or changes in moles, especially if there is a change in a mole's shape or color. ? If you have a mole that is larger than the size of a pencil eraser.  If any of your family members has a history of skin cancer, especially at a young age, talk with your health care provider about genetic screening.  Always use sunscreen. Apply sunscreen liberally and repeatedly throughout the day.  Whenever you are outside, protect yourself by wearing long sleeves, pants, a wide-brimmed hat, and sunglasses.  What should I know about osteoporosis? Osteoporosis is a condition in which bone destruction happens more quickly than new bone creation. After menopause, you may be at an increased risk for osteoporosis. To help prevent osteoporosis or the bone fractures that can happen because of osteoporosis, the following is recommended:  If you are 37-3 years old, get at least 1,000 mg of calcium and at least 600 mg of vitamin D per day.  If you are older than age 68 but younger than age 45, get at least 1,200 mg of calcium and at least 600 mg of vitamin D per day.  If you are older than age 34, get at least 1,200 mg of calcium and at least 800 mg of vitamin D per day.  Smoking and excessive alcohol intake  increase the risk of osteoporosis. Eat foods that are rich in calcium and vitamin D, and do weight-bearing exercises several times each week as directed by your health care provider. What should I know about how menopause affects my mental health? Depression may occur at any age, but it is more common as you become older. Common symptoms of depression include:  Low or sad mood.  Changes in sleep patterns.  Changes in appetite or eating patterns.  Feeling an overall lack of motivation or enjoyment of activities that you previously enjoyed.  Frequent crying spells.  Talk with your health care provider if you think that you are experiencing depression. What should I know about immunizations? It is important that you get and maintain your  immunizations. These include:  Tetanus, diphtheria, and pertussis (Tdap) booster vaccine.  Influenza every year before the flu season begins.  Pneumonia vaccine.  Shingles vaccine.  Your health care provider may also recommend other immunizations. This information is not intended to replace advice given to you by your health care provider. Make sure you discuss any questions you have with your health care provider. Document Released: 12/18/2005 Document Revised: 05/15/2016 Document Reviewed: 07/30/2015 Elsevier Interactive Patient Education  2018 [employer].

## 2018-03-07 LAB — IGP, COBASHPV16/18
HPV 16: NEGATIVE
HPV 18: NEGATIVE
HPV other hr types: NEGATIVE
PAP SMEAR COMMENT: 0

## 2018-03-08 DIAGNOSIS — F332 Major depressive disorder, recurrent severe without psychotic features: Secondary | ICD-10-CM | POA: Diagnosis not present

## 2018-03-08 DIAGNOSIS — F410 Panic disorder [episodic paroxysmal anxiety] without agoraphobia: Secondary | ICD-10-CM | POA: Diagnosis not present

## 2018-03-12 DIAGNOSIS — I6523 Occlusion and stenosis of bilateral carotid arteries: Secondary | ICD-10-CM | POA: Insufficient documentation

## 2018-03-12 DIAGNOSIS — I5022 Chronic systolic (congestive) heart failure: Secondary | ICD-10-CM | POA: Insufficient documentation

## 2018-03-12 NOTE — Progress Notes (Signed)
Cardiology Office Note  Date:  03/15/2018   ID:  [name], [name] 1956-10-16, MRN [id number]  PCP:  Leonel Ramsay, MD   Chief Complaint  Patient presents with  . OTHER    Discuss cholestorol levels. Meds reviewed verbally with pt.    HPI:  [name] is a 62 year old woman with long history of  smoking who stopped in May 2014,  coronary artery disease with non-ST elevation in may 2015 with stent placed to her distal RCA,  stroke October 2014 evaluated at [facility].  Previous anginal symptoms included back pain, chest pain like an elephant sitting on her chest cardiac catheterization performed for anginal symptoms, Cath 08/19/2016:, No intervention performed  She presents for routine followup of her coronary artery disease.   In follow-up today reports she is compliant with her Lipitor and Zetia Discussed with primary care and they are suggesting more aggressive interventions Denies any chest pain concerning for angina No regular exercise program Overall feels well with no complaints  Long review of her cholesterol Previously was much improved 2016 but does not know how she got those numbers down  She has severe ostio arthritis of the knee, bone-on-bone  EKG personally reviewed by myself on todays visit Shows normal sinus rhythm with rate 69 bpm T wave abnormality V3 to V6 No significant change compared to prior EKG  There past medical history reviewed cardiac catheterization  08/19/2016: Left anterior descending (LAD):30% proximal LAD disease, 40% mid LAD disease. 70% ostial and proximal disease of the D2 and D3 vessel Right coronary artery (RCA):40 to 50% ostial disease, 30% proximal disease.  Left ventriculography: Left ventricular systolic function is normal, LVEF is estimated at 55-65%, there is no significant mitral regurgitation , no significant aortic valve stenosis  Conclusions:  Stable mild to moderate LAD disease Moderate to severe ostial and proximal  diagonal disease (unchanged or improved from previous study) Mild to Moderate ostial RCA disease, mild proximal RCA disease Normal EF No intervention performed   she was noted to have some coronary spasm that resolved with nitroglycerin   hospitalization 12/31/2013 where she presented with angina. He had a catheterization with distal RCA stent placed.  Also started on ranexa, continued on isosorbide.   Previous Cardiac catheterization report 01/01/2014 details 95% distal RCA lesion with stent placed. Also had 40% mid LAD, 30% distal LAD, 80% D1 disease, 60% D2 disease, 50% ostial RCA disease, 50% proximal RCA disease, 60% mid RCA disease stent placed was a xience  EX 2.5 millimeter stent  Cardiac catheterization in 03/16/2013 details occluded mid RCA with collaterals from distal LAD also 75% stenosis diagonal #1 near the ostium Ejection fraction at that time was 46% notes also detailed 40% mid LAD, 30% distal LAD, 50% D2 disease, 30% proximal circumflex, 75% proximal RCA disease  Echocardiogram 03/16/2013 with ejection fraction 30-35%, moderate to severe mitral valve regurgitation, moderate tricuspid valve regurgitation .  echocardiogram October 2014 following her stroke showed relatively normal ejection fraction estimated at least greater than 55% with no significant valvular regurgitation  PMH:   has a past medical history of Anxiety, Arthritis, CAD (coronary artery disease), Carotid disease, bilateral ([place]), Complication of anesthesia, CVA (cerebral vascular accident) ([place]), Decreased libido, Depression, GERD (gastroesophageal reflux disease), History of recurrent UTIs, Hypercholesteremia, Hyperlipemia, Hypertension, Insomnia, Ischemic cardiomyopathy, Menopause, Migraines, Myocardial infarction ([place]), PONV (postoperative nausea and vomiting), Right lower quadrant pain, Syncope and collapse, Tobacco abuse, and Vaginal atrophy.  PSH:    Past Surgical History:  Procedure  Laterality Date  .  CARDIAC CATHETERIZATION  01/01/2014  . CARDIAC CATHETERIZATION  03/16/2013  . CARDIAC CATHETERIZATION  12/2013  . CARDIAC CATHETERIZATION N/A 08/19/2016   Procedure: Left Heart Cath and Coronary Angiography;  Surgeon: Minna Merritts, MD;  Location: [facility];  Service: Cardiovascular;  Laterality: N/A;  . CESAREAN SECTION WITH BILATERAL TUBAL LIGATION    . CORONARY ANGIOPLASTY WITH STENT PLACEMENT  01/01/2014   95% lesion with a drug eluting stent to the distal RCA.  Marland Kitchen ENDOMETRIAL ABLATION    . KNEE ARTHROSCOPY  11/27/2006   left knee   . OOPHORECTOMY    . TOTAL KNEE ARTHROPLASTY Left 10/06/2016   Procedure: TOTAL KNEE ARTHROPLASTY;  Surgeon: Corky Mull, MD;  Location: [facility];  Service: Orthopedics;  Laterality: Left;  . TUBAL LIGATION      Current Outpatient Medications  Medication Sig Dispense Refill  . acetaminophen (TYLENOL) 500 MG tablet Take 500 mg by mouth every 6 (six) hours as needed for mild pain or moderate pain.     Marland Kitchen aspirin 81 MG tablet Take 81 mg by mouth daily.    Marland Kitchen atorvastatin (LIPITOR) 80 MG tablet Take 1 tablet (80 mg total) by mouth at bedtime. 90 tablet 4  . buPROPion (WELLBUTRIN SR) 100 MG 12 hr tablet TK 1 T PO D AT 8 AM AND 1 T AT 3 PM    . Cholecalciferol (VITAMIN D PO) Take 1 capsule by mouth daily.    . cyclobenzaprine (FLEXERIL) 5 MG tablet Take 5 mg by mouth as needed.   0  . ezetimibe (ZETIA) 10 MG tablet Take 1 tablet (10 mg total) by mouth daily. 90 tablet 4  . furosemide (LASIX) 20 MG tablet Take 20 mg by mouth daily as needed for fluid or edema.     . hydrOXYzine (ATARAX/VISTARIL) 25 MG tablet Take 25mg s twice daily as needed for anxiety  3  . metoprolol succinate (TOPROL-XL) 25 MG 24 hr tablet Take 1 tablet (25 mg total) by mouth every morning. 90 tablet 3  . nitroGLYCERIN (NITROSTAT) 0.4 MG SL tablet Place 1 tablet (0.4 mg total) under the tongue every 5 (five) minutes as needed for chest pain. 25 tablet 6  . pantoprazole (PROTONIX) 40  MG tablet Take 40 mg by mouth at bedtime.     . potassium chloride SA (K-DUR,KLOR-CON) 20 MEQ tablet Take 1 tablet (20 mEq total) by mouth daily as needed. 30 tablet 3  . triamterene-hydrochlorothiazide (DYAZIDE) 37.5-25 MG capsule TAKE 1 TABLET BY MOUTH DAILY  5  . zolpidem (AMBIEN) 10 MG tablet Take 10 mg by mouth at bedtime as needed for sleep.    . Alirocumab (PRALUENT) 75 MG/ML SOPN Inject 75 mg into the skin every 14 (fourteen) days. 2 pen 6   No current facility-administered medications for this visit.      Allergies:   Codeine sulfate and Penicillins   Social History:  The patient  reports that she quit smoking about 4 years ago. Her smoking use included cigarettes. She has a 5.00 pack-year smoking history. She has never used smokeless tobacco. She reports that she drinks alcohol. She reports that she does not use drugs.   Family History:   family history includes Breast cancer in her paternal grandmother; Hyperlipidemia in her mother; Hypertension in her mother; Stroke in her mother.    Review of Systems: Review of Systems  Constitutional: Negative.   Respiratory: Negative.   Cardiovascular: Negative.   Gastrointestinal: Negative.  Musculoskeletal: Positive for joint pain.  Neurological: Negative.   Psychiatric/Behavioral: Negative.   All other systems reviewed and are negative.    PHYSICAL EXAM: VS:  BP 124/70 (BP Location: Left Arm, Patient Position: Sitting, Cuff Size: Normal)   Pulse 69   Ht 5\' 2"  (1.575 m)   Wt 146 lb 12 oz (66.6 kg)   BMI 26.84 kg/m  , BMI Body mass index is 26.84 kg/m.  Constitutional:  oriented to person, place, and time. No distress.  HENT:  Head: Normocephalic and atraumatic.  Eyes:  no discharge. No scleral icterus.  Neck: Normal range of motion. Neck supple. No JVD present.  Cardiovascular: Normal rate, regular rhythm, normal heart sounds and intact distal pulses. Exam reveals no gallop and no friction rub. No edema No murmur  heard. Pulmonary/Chest: Effort normal and breath sounds normal. No stridor. No respiratory distress.  no wheezes.  no rales.  no tenderness.  Abdominal: Soft.  no distension.  no tenderness.  Musculoskeletal: Normal range of motion.  no  tenderness or deformity.  Neurological:  normal muscle tone. Coordination normal. No atrophy Skin: Skin is warm and dry. No rash noted. not diaphoretic.  Psychiatric:  normal mood and affect. behavior is normal. Thought content normal.   Recent Labs: 12/27/2017: ALT 21; BUN 23; Creatinine, Ser 0.84; Hemoglobin 13.9; Platelets 299; Potassium 3.3; Sodium 136    Lipid Panel Lab Results  Component Value Date   CHOL 156 02/18/2015   HDL 55 02/18/2015   LDLCALC 69 02/18/2015   TRIG 162 (H) 02/18/2015      Wt Readings from Last 3 Encounters:  03/15/18 146 lb 12 oz (66.6 kg)  02/22/18 145 lb 9.6 oz (66 kg)  12/27/17 139 lb (63 kg)       ASSESSMENT AND PLAN:  CAD in native artery - Plan: EKG 12-Lead Currently with no symptoms of angina. No further workup at this time. .stable We will place a request for PCSK9 inhibitor On maximum Lipitor and Zetia and far from goal  Essential hypertension - Plan: EKG 12-Lead Blood pressure is well controlled on today's visit. No changes made to the medications. stable  Pure hypercholesterolemia Reports compliance with Lipitor and Zetia and still far from goal PCSK9 inhibitor has been requested/order placed Will work on preoperative and  Bilateral carotid artery disease ([place]) Stressed importance of smoking cessation, aggressive lipid panel Discussed with her again today  Angina pectoris ([place]) Currently without chest pain symptoms. Stable disease on cardiac catheterization. In 2017  Not requiring nitroglycerin No further ischemic workup at this time  History of coronary artery stent placement On aspirin with no Plavix    Total encounter time more than 25 minutes  Greater than 50% was spent in  counseling and coordination of care with the patient  Disposition:   F/U  12 months   Orders Placed This Encounter  Procedures  . Hepatic function panel  . Lipid Profile  . EKG 12-Lead     Signed, Esmond Plants, M.D., Ph.D. 03/15/2018  Langley, Anita

## 2018-03-15 ENCOUNTER — Ambulatory Visit: Payer: PPO | Admitting: Cardiovascular Disease

## 2018-03-15 ENCOUNTER — Encounter: Payer: Self-pay | Admitting: Cardiovascular Disease

## 2018-03-15 VITALS — BP 124/70 | HR 69 | Ht 62.0 in | Wt 146.8 lb

## 2018-03-15 DIAGNOSIS — E782 Mixed hyperlipidemia: Secondary | ICD-10-CM

## 2018-03-15 DIAGNOSIS — I6523 Occlusion and stenosis of bilateral carotid arteries: Secondary | ICD-10-CM | POA: Diagnosis not present

## 2018-03-15 DIAGNOSIS — I25118 Atherosclerotic heart disease of native coronary artery with other forms of angina pectoris: Secondary | ICD-10-CM

## 2018-03-15 DIAGNOSIS — F332 Major depressive disorder, recurrent severe without psychotic features: Secondary | ICD-10-CM | POA: Diagnosis not present

## 2018-03-15 DIAGNOSIS — F410 Panic disorder [episodic paroxysmal anxiety] without agoraphobia: Secondary | ICD-10-CM | POA: Diagnosis not present

## 2018-03-15 DIAGNOSIS — I10 Essential (primary) hypertension: Secondary | ICD-10-CM | POA: Diagnosis not present

## 2018-03-15 DIAGNOSIS — I5022 Chronic systolic (congestive) heart failure: Secondary | ICD-10-CM

## 2018-03-15 DIAGNOSIS — F5105 Insomnia due to other mental disorder: Secondary | ICD-10-CM | POA: Diagnosis not present

## 2018-03-15 MED ORDER — EZETIMIBE 10 MG PO TABS: 10.0000 mg | ORAL_TABLET | Freq: Every day | ORAL | 4 refills | Status: DC

## 2018-03-15 MED ORDER — ALIROCUMAB 75 MG/ML ~~LOC~~ SOPN: 75.0000 mg | PEN_INJECTOR | SUBCUTANEOUS | 6 refills | Status: DC

## 2018-03-15 MED ORDER — METOPROLOL SUCCINATE ER 25 MG PO TB24: 25.0000 mg | ORAL_TABLET | Freq: Every morning | ORAL | 3 refills | Status: DC

## 2018-03-15 MED ORDER — ATORVASTATIN CALCIUM 80 MG PO TABS: 80.0000 mg | ORAL_TABLET | Freq: Every day | ORAL | 4 refills | Status: DC

## 2018-03-15 NOTE — Patient Instructions (Addendum)
Medication Instructions:   We will send in a script for praluent one shot every two weeks Medication Samples have been provided to the patient.  Drug name: Praluent       Strength: 75 mg/ml        Qty: 1 pen  LOT: [id number]  Exp.Date: 10/08/2018   Labwork:  Liver and lipids three months after starting the medication  Testing/Procedures:  No further testing at this time   Follow-Up: It was a pleasure seeing you in the office today. Please call us if you have new issues that need to be addressed before your next appt.  [phone]  Your physician wants you to follow-up in: 12 months.  You will receive a reminder letter in the mail two months in advance. If you don't receive a letter, please call our office to schedule the follow-up appointment.  If you need a refill on your cardiac medications before your next appointment, please call your pharmacy.  For educational health videos Log in to : [email or web address] Or : [email or web address], password : [email or web address]

## 2018-03-18 ENCOUNTER — Telehealth: Payer: Self-pay | Admitting: *Deleted

## 2018-03-18 ENCOUNTER — Telehealth: Payer: Self-pay | Admitting: Cardiovascular Disease

## 2018-03-18 NOTE — Telephone Encounter (Signed)
I will fwd to nurse for completion.

## 2018-03-18 NOTE — Telephone Encounter (Signed)
I am unable to complete the prior authorization, this message pops up when I try: "There is an existing case within the [facility] environment that has the same patient, prescriber, and drug. This case must be finalized before proceeding with similar requests."  Has your office already started the PA form? If so, it looks like it will need to be completed from your end.

## 2018-03-18 NOTE — Telephone Encounter (Signed)
Please call regarding status of Praluent PA

## 2018-03-18 NOTE — Telephone Encounter (Signed)
Thank you :)

## 2018-03-18 NOTE — Telephone Encounter (Signed)
Pt requiring PA for Praluent 75 MG/SOPN on covermymeds.  Dagmar Adcox ([employer]) - [id number] Praluent 75MG /ML [place] SOPN Status: Question Response - N/A Created: May 8th, 2019 [phone]

## 2018-03-21 NOTE — Telephone Encounter (Signed)
[name] from [facility] calling in regards to [name] prior auth Due to no response the PA was closed out and will need to be re initiated

## 2018-03-22 NOTE — Telephone Encounter (Signed)
S/w representative at [place]. Original Prior authorization was dismissed due to not receiving the information needed in time.   Initiated another prior authorization. Used ICD-10 codes: I25.118, E78.2, E78.00. This was completed via telephone with representative. Ref # [id number]. Will await response. Response will come via fax with determination.

## 2018-03-23 DIAGNOSIS — F410 Panic disorder [episodic paroxysmal anxiety] without agoraphobia: Secondary | ICD-10-CM | POA: Diagnosis not present

## 2018-03-23 DIAGNOSIS — F332 Major depressive disorder, recurrent severe without psychotic features: Secondary | ICD-10-CM | POA: Diagnosis not present

## 2018-03-24 NOTE — Telephone Encounter (Signed)
Fax received from [employer] that the patient's praluent has been approved from 03/22/18-11/08/18.  Called [facility] pharmacy at [phone] and left a message this has been approved.

## 2018-03-30 NOTE — Telephone Encounter (Signed)
Patient calling States that she has not heard anything on the status of the Praluent She is unable to afford and would like to discuss options Please call to discuss

## 2018-03-30 NOTE — Telephone Encounter (Signed)
No answer and went straight to voicemail. Left message to call back.

## 2018-04-05 ENCOUNTER — Telehealth: Payer: Self-pay | Admitting: Cardiovascular Disease

## 2018-04-05 NOTE — Telephone Encounter (Signed)
No answer. Left detailed message, ok per DPR, that we will be working on this process and reach back out to her concerning details. Will reach out to pharmacist who will be in the office tomorrow.

## 2018-04-05 NOTE — Telephone Encounter (Signed)
Pt calling stating she went to [facility] to pick up the Praluent and states she can not afford it States it is $367 per month  Would like advise on this

## 2018-04-06 NOTE — Telephone Encounter (Signed)
S/w patient. She cannot afford her medication. She is past due for injection. She was supposed to do injection on 03/27/18 but did not have the medication. Advised patient I can leave one dosage of Praluent for her to pick up and then I will meet with pharmacist for advice on patient assistance. She verbalized understanding to pick up sample and that we will be in touch with next steps.  Medication Samples have been provided to the patient.  Drug name: Praluent       Strength: 75 mg        Qty: 1 injection pen  LOT: [id number]  Exp.Date: 10/08/2018

## 2018-04-06 NOTE — Telephone Encounter (Signed)
S/w patient in another telephone encounter. In process of working out plan of care for patient.

## 2018-04-07 NOTE — Telephone Encounter (Signed)
No answer. Left message to call back.  Will need to have patient fill out patient assistance information.

## 2018-04-18 NOTE — Telephone Encounter (Signed)
No answer. Left message to call back.   

## 2018-04-19 NOTE — Telephone Encounter (Signed)
Patient here in office today with a family member during appointment. Patient asked about Pralulent. She had not received the message to call back. Provided patient with Praluent patient assistance paperwork to fill out and bring back for physician completion to send off. Patient due for next injection on this Friday. Called drug rep and he will bring Praluent samples by tomorrow.

## 2018-04-21 ENCOUNTER — Other Ambulatory Visit: Payer: Self-pay | Admitting: Obstetrics and Gynecology

## 2018-04-21 NOTE — Telephone Encounter (Signed)
Patient dropped off Assistance forms to be completed Placed in nurse box

## 2018-04-25 DIAGNOSIS — F332 Major depressive disorder, recurrent severe without psychotic features: Secondary | ICD-10-CM | POA: Diagnosis not present

## 2018-04-25 DIAGNOSIS — F410 Panic disorder [episodic paroxysmal anxiety] without agoraphobia: Secondary | ICD-10-CM | POA: Diagnosis not present

## 2018-04-26 NOTE — Telephone Encounter (Signed)
Application received. Signed by Dr Rockey Situ and faxed to [employer] program (Praluent patient assistance).

## 2018-05-03 NOTE — Telephone Encounter (Signed)
S/w [employer] representative. They are missing some answers to questions that patient did not answer as well as they need the prior authorization approval. Representative said sometimes the patient may have the prior authorization but not know what it is. Patient also needs to call [employer] to answer those additional questions on the application over the phone.  Attempted to call patient. Left message for her to call us back.

## 2018-05-06 NOTE — Telephone Encounter (Signed)
Left voicemail message for patient to call back regarding application for assistance.

## 2018-05-25 DIAGNOSIS — A499 Bacterial infection, unspecified: Secondary | ICD-10-CM | POA: Diagnosis not present

## 2018-05-25 DIAGNOSIS — R300 Dysuria: Secondary | ICD-10-CM | POA: Diagnosis not present

## 2018-05-25 DIAGNOSIS — B373 Candidiasis of vulva and vagina: Secondary | ICD-10-CM | POA: Diagnosis not present

## 2018-05-25 DIAGNOSIS — N390 Urinary tract infection, site not specified: Secondary | ICD-10-CM | POA: Diagnosis not present

## 2018-05-31 DIAGNOSIS — L812 Freckles: Secondary | ICD-10-CM | POA: Diagnosis not present

## 2018-05-31 DIAGNOSIS — L74510 Primary focal hyperhidrosis, axilla: Secondary | ICD-10-CM | POA: Diagnosis not present

## 2018-05-31 DIAGNOSIS — F5105 Insomnia due to other mental disorder: Secondary | ICD-10-CM | POA: Diagnosis not present

## 2018-05-31 DIAGNOSIS — Z1283 Encounter for screening for malignant neoplasm of skin: Secondary | ICD-10-CM | POA: Diagnosis not present

## 2018-05-31 DIAGNOSIS — D2270 Melanocytic nevi of unspecified lower limb, including hip: Secondary | ICD-10-CM | POA: Diagnosis not present

## 2018-05-31 DIAGNOSIS — D2260 Melanocytic nevi of unspecified upper limb, including shoulder: Secondary | ICD-10-CM | POA: Diagnosis not present

## 2018-05-31 DIAGNOSIS — F410 Panic disorder [episodic paroxysmal anxiety] without agoraphobia: Secondary | ICD-10-CM | POA: Diagnosis not present

## 2018-05-31 DIAGNOSIS — L821 Other seborrheic keratosis: Secondary | ICD-10-CM | POA: Diagnosis not present

## 2018-05-31 DIAGNOSIS — F332 Major depressive disorder, recurrent severe without psychotic features: Secondary | ICD-10-CM | POA: Diagnosis not present

## 2018-05-31 DIAGNOSIS — D485 Neoplasm of uncertain behavior of skin: Secondary | ICD-10-CM | POA: Diagnosis not present

## 2018-05-31 DIAGNOSIS — D225 Melanocytic nevi of trunk: Secondary | ICD-10-CM | POA: Diagnosis not present

## 2018-05-31 DIAGNOSIS — L570 Actinic keratosis: Secondary | ICD-10-CM | POA: Diagnosis not present

## 2018-05-31 DIAGNOSIS — L578 Other skin changes due to chronic exposure to nonionizing radiation: Secondary | ICD-10-CM | POA: Diagnosis not present

## 2018-06-01 NOTE — Telephone Encounter (Signed)
No answer. Left message with patient to call back.  Called [employer] program representative. She states they have already called patient's insurance company and found out that [place] was approved. The next step is for patient to apply for [place] assistance through her Social Security program by calling [phone]. Whether she is approved or not through this program, patient should still qualify for assistance. They just need a letter from program saying she attempted to apply.   Will attempt to reach patient again later.

## 2018-06-07 NOTE — Telephone Encounter (Signed)
No answer. Left message to call back.   

## 2018-06-30 ENCOUNTER — Other Ambulatory Visit (HOSPITAL_COMMUNITY)
Admission: RE | Admit: 2018-06-30 | Discharge: 2018-06-30 | Disposition: A | Discharge status: Home / Self Care | Payer: PPO | Source: Ambulatory Visit | Attending: Obstetrics and Gynecology | Admitting: Obstetrics and Gynecology

## 2018-06-30 ENCOUNTER — Encounter: Payer: Self-pay | Admitting: Obstetrics and Gynecology

## 2018-06-30 ENCOUNTER — Ambulatory Visit: Payer: PPO | Admitting: Obstetrics and Gynecology

## 2018-06-30 VITALS — BP 137/70 | HR 92 | Ht 62.0 in | Wt 147.8 lb

## 2018-06-30 DIAGNOSIS — R1031 Right lower quadrant pain: Secondary | ICD-10-CM | POA: Diagnosis not present

## 2018-06-30 DIAGNOSIS — N898 Other specified noninflammatory disorders of vagina: Secondary | ICD-10-CM | POA: Insufficient documentation

## 2018-06-30 MED ORDER — AZITHROMYCIN 1 G PO PACK: 1.0000 g | PACK | Freq: Once | ORAL | 0 refills | Status: AC

## 2018-06-30 MED ORDER — METRONIDAZOLE 500 MG PO TABS: 500.0000 mg | ORAL_TABLET | Freq: Two times a day (BID) | ORAL | 0 refills | Status: DC

## 2018-06-30 NOTE — Progress Notes (Signed)
Chief complaint: 1.  Pelvic pain  GYN ENCOUNTER NOTE  Subjective:       [name] is a 62 y.o. G58P1001 female is here for gynecologic evaluation of the following issues:  1.  Pelvic pain; onset 1 week ago; sharp stabbing, made worse with hip extension; no change in bowel function; no change in bladder function; no fever chills or sweats.  History of chlamydia treated in recent past; patient states she has not had any new partner since that episode.  She is experiencing a prominent vaginal discharge over the past week.  She has noted no vulvar itching or burning.  No malodor.  Menopausal; not on HRT History of cesarean section, BTL History of oophorectomy History of endometrial ablation History of chlamydia cervicitis 12/27/2017, treated History of trichomonas, 12/27/2017, treated Gynecologic History No LMP recorded. Patient has had an ablation.  Obstetric History OB History  Gravida Para Term Preterm AB Living  1 1 1     1   SAB TAB Ectopic Multiple Live Births          1    # Outcome Date GA Lbr Len/2nd Weight Sex Delivery Anes PTL Lv  1 Term      CS-LTranv   LIV    Past Medical History:  Diagnosis Date  . Anxiety   . Arthritis   . CAD (coronary artery disease)    a. 03/2013 NSTEMI/Cath: 100 RCA, EF 45%->Med Rx;  b. 12/2013 Cath/PCI: LAD 61m, 30d, D1 80, D2 60, LCX nl, RCA 50ost/p, 75m, 95d (2.5x33 Xience DES);  c. 05/2014 Lexi MV: EF 68%, no ischemia; d. stress echo 12/2015 no ischemia @ max exercise (did not achieve target)  . Carotid disease, bilateral ([place])    a. 08/2013 Carotid U/S: 1-39% bilat ICA stenosis.  . Complication of anesthesia   . CVA (cerebral vascular accident) ([place])    a. 08/2013.  [name]   . Depression   . GERD (gastroesophageal reflux disease)   . History of recurrent UTIs   . Hypercholesteremia   . Hyperlipemia   . Hypertension   . Insomnia   . Ischemic cardiomyopathy    a. 03/2013 Echo: EF 30-35%, mod dil LA, mod-sev MR, mod TR;  b.  08/2013 Echo EF 60-65%, mild AI.  Marland Kitchen Menopause   . Migraines   . Myocardial infarction ([place])   . PONV (postoperative nausea and vomiting)    If anesthsia is given slowly, pt will not throw up, if given quickly, pt will have nausea and vomiting.  . Right lower quadrant pain   . Syncope and collapse   . Tobacco abuse   . Vaginal atrophy     Past Surgical History:  Procedure Laterality Date  . CARDIAC CATHETERIZATION  01/01/2014  . CARDIAC CATHETERIZATION  03/16/2013  . CARDIAC CATHETERIZATION  12/2013  . CARDIAC CATHETERIZATION N/A 08/19/2016   Procedure: Left Heart Cath and Coronary Angiography;  Surgeon: Minna Merritts, MD;  Location: [facility];  Service: Cardiovascular;  Laterality: N/A;  . CESAREAN SECTION WITH BILATERAL TUBAL LIGATION    . CORONARY ANGIOPLASTY WITH STENT PLACEMENT  01/01/2014   95% lesion with a drug eluting stent to the distal RCA.  Marland Kitchen ENDOMETRIAL ABLATION    . KNEE ARTHROSCOPY  11/27/2006   left knee   . OOPHORECTOMY    . TOTAL KNEE ARTHROPLASTY Left 10/06/2016   Procedure: TOTAL KNEE ARTHROPLASTY;  Surgeon: Corky Mull, MD;  Location: [facility];  Service: Orthopedics;  Laterality: Left;  . TUBAL LIGATION      Current Outpatient Medications on File Prior to Visit  Medication Sig Dispense Refill  . acetaminophen (TYLENOL) 500 MG tablet Take 500 mg by mouth every 6 (six) hours as needed for mild pain or moderate pain.     Marland Kitchen aspirin 81 MG tablet Take 81 mg by mouth daily.    Marland Kitchen atorvastatin (LIPITOR) 80 MG tablet Take 1 tablet (80 mg total) by mouth at bedtime. 90 tablet 4  . buPROPion (WELLBUTRIN SR) 100 MG 12 hr tablet TK 1 T PO D AT 8 AM AND 1 T AT 3 PM    . Cholecalciferol (VITAMIN D PO) Take 1 capsule by mouth daily.    Marland Kitchen ezetimibe (ZETIA) 10 MG tablet Take 1 tablet (10 mg total) by mouth daily. 90 tablet 4  . furosemide (LASIX) 20 MG tablet Take 20 mg by mouth daily as needed for fluid or edema.     . hydrOXYzine (ATARAX/VISTARIL) 25 MG tablet  Take 25mg s twice daily as needed for anxiety  3  . metoprolol succinate (TOPROL-XL) 25 MG 24 hr tablet Take 1 tablet (25 mg total) by mouth every morning. 90 tablet 3  . nitrofurantoin, macrocrystal-monohydrate, (MACROBID) 100 MG capsule Take 100 mg by mouth at bedtime.    . nitroGLYCERIN (NITROSTAT) 0.4 MG SL tablet Place 1 tablet (0.4 mg total) under the tongue every 5 (five) minutes as needed for chest pain. 25 tablet 6  . pantoprazole (PROTONIX) 40 MG tablet Take 40 mg by mouth at bedtime.     . potassium chloride SA (K-DUR,KLOR-CON) 20 MEQ tablet Take 1 tablet (20 mEq total) by mouth daily as needed. 30 tablet 3  . triamterene-hydrochlorothiazide (DYAZIDE) 37.5-25 MG capsule TAKE 1 TABLET BY MOUTH DAILY  5  . zolpidem (AMBIEN) 10 MG tablet Take 10 mg by mouth at bedtime as needed for sleep.     No current facility-administered medications on file prior to visit.     Allergies  Allergen Reactions  . Codeine Sulfate     sick  . Penicillins Nausea And Vomiting    Has patient had a PCN reaction causing immediate rash, facial/tongue/throat swelling, SOB or lightheadedness with hypotension: No Has patient had a PCN reaction causing severe rash involving mucus membranes or skin necrosis: No Has patient had a PCN reaction that required hospitalization No Has patient had a PCN reaction occurring within the last 10 years: No If all of the above answers are "NO", then may proceed with Cephalosporin use.     Social History   Socioeconomic History  . Marital status: Single    Spouse name: Not on file  . Number of children: 1  . Years of education: Not on file  . Highest education level: Not on file  Occupational History  . Occupation: Firefighter: [employer]  . Financial resource strain: Not on file  . Food insecurity:    Worry: Not on file    Inability: Not on file  . Transportation needs:    Medical: Not on file    Non-medical: Not on file   Tobacco Use  . Smoking status: Former Smoker    Packs/day: 0.25    Years: 20.00    Pack years: 5.00    Types: Cigarettes    Last attempt to quit: 04/07/2013    Years since quitting: 5.2  . Smokeless tobacco: Never Used  Substance and Sexual Activity  .  Alcohol use: Yes    Comment: 1 glass of wine a month  . Drug use: No  . Sexual activity: Not Currently    Birth control/protection: None  Lifestyle  . Physical activity:    Days per week: 0 days    Minutes per session: 0 min  . Stress: Not on file  Relationships  . Social connections:    Talks on phone: Not on file    Gets together: Not on file    Attends religious service: Not on file    Active member of club or organization: Not on file    Attends meetings of clubs or organizations: Not on file    Relationship status: Not on file  . Intimate partner violence:    Fear of current or ex partner: Not on file    Emotionally abused: Not on file    Physically abused: Not on file    Forced sexual activity: Not on file  Other Topics Concern  . Not on file  Social History Narrative  . Not on file    Family History  Problem Relation Age of Onset  . Hypertension Mother   . Stroke Mother   . Hyperlipidemia Mother   . Breast cancer Paternal Grandmother   . Colon cancer Neg Hx   . Ovarian cancer Neg Hx   . Heart disease Neg Hx   . Diabetes Neg Hx     The following portions of the patient's history were reviewed and updated as appropriate: allergies, current medications, past family history, past medical history, past social history, past surgical history and problem list.  Review of Systems Review of Systems  Constitutional: Negative for chills, fever, malaise/fatigue and weight loss.  Respiratory: Negative.   Cardiovascular: Negative.   Gastrointestinal: Positive for abdominal pain. Negative for blood in stool, constipation, diarrhea, nausea and vomiting.  Genitourinary: Negative for dysuria, flank pain, frequency,  hematuria and urgency.       Vaginal discharge x1 week  Musculoskeletal: Negative.   Skin: Negative.   Neurological: Negative.   Psychiatric/Behavioral: Negative.    Objective:   BP 137/70   Pulse 92   Ht 5\' 2"  (1.575 m)   Wt 147 lb 12.8 oz (67 kg)   BMI 27.03 kg/m  CONSTITUTIONAL: Well-developed, well-nourished female in no acute distress.  HENT:  Normocephalic, atraumatic.  NECK: Normal range of motion, supple, no masses.  Normal thyroid.  SKIN: Skin is warm and dry. No rash noted. Not diaphoretic. No erythema. No pallor. [place]: Alert and oriented to person, place, and time. PSYCHIATRIC: Normal mood and affect. Normal behavior. Normal judgment and thought content. CARDIOVASCULAR:Not Examined RESPIRATORY: Not Examined BREASTS: Not Examined BACK: No CVA tenderness ABDOMEN: Soft, non distended; Non tender.  No Organomegaly. BLADDER: Nontender PELVIC:  External Genitalia: Normal  BUS: Normal  Vagina: Fair estrogen effect; moderate white discharge  Cervix: No lesions; moderate white discharge; 1/4 cervical motion tenderness  Uterus: Normal size, shape,consistency, mobile; 1/4 tender  Adnexa: Nonpalpable; right lower quadrant tender 1/4 without mass  RV: Normal external exam; normal sphincter tone; rectal exam reveals no masses  Bladder: Nontender MUSCULOSKELETAL: Normal range of motion. No tenderness.  No cyanosis, clubbing, or edema.   PROCEDURE: Wet prep Normal saline-TNTC white blood cells; no trichomonas; no clue cells KOH-no yeast  Assessment:   1. Right lower quadrant abdominal pain - US PELVIS TRANSVANGINAL NON-OB (TV ONLY); Future - US PELVIS (TRANSABDOMINAL ONLY); Future  2. Vaginal discharge - Cervicovaginal ancillary only  3. Vaginal leukorrhea  Plan:   1.  Wet prep as noted 2.  Pelvic ultrasound 3.  Nuswab plus for GC/chlamydia/trichomonas/BV/yeast 4.  Zithromax 1 g p.o. 5.  Metronidazole 500 mg twice a day for 7 days 6.  Tylenol Extra  Strength 2 tablets 4 times a day as needed for pain 7.  Return in 2 weeks for follow-up  A total of 15 minutes were spent face-to-face with the patient during this encounter and over half of that time dealt with counseling and coordination of care.  Brayton Mars, MD  Note: This dictation was prepared with Dragon dictation along with smaller phrase technology. Any transcriptional errors that result from this process are unintentional.

## 2018-06-30 NOTE — Patient Instructions (Signed)
1.  Cervical cultures are obtained to rule out infection 2.  Pelvic ultrasound is ordered. 3.  Take Zithromax 1 g orally x1 dose 4.  Take Flagyl 500 mg twice a day for 7 days 5.  Take Tylenol extra strength 2 tablets 4 times a day as needed for pain 6.  Return in 2 weeks for follow-up  (Flagyl) metronidazole extended-release tablets What is this medicine? METRONIDAZOLE (me troe NI da zole) is an antiinfective. It is used to treat certain kinds of bacterial and protozoal infections. It will not work for colds, flu, or other viral infections. This medicine may be used for other purposes; ask your health care provider or pharmacist if you have questions. COMMON BRAND NAME(S): Flagyl ER What should I tell my health care provider before I take this medicine? They need to know if you have any of these conditions: -anemia or other blood disorders -disease of the nervous system -fungal or yeast infection -if you drink alcohol containing drinks -liver disease -seizures -an unusual or allergic reaction to metronidazole, or other medicines, foods, dyes, or preservatives -pregnant or trying to get pregnant -breast-feeding How should I use this medicine? Take this medicine by mouth with a full glass of water. Follow the directions on the prescription label. Do not crush or chew. Take this medicine on an empty stomach 1 hour before or 2 hours after meals or food. Take your medicine at regular intervals. Do not take your medicine more often than directed. Take all of your medicine as directed even if you think you are better. Do not skip doses or stop your medicine early. Talk to your pediatrician regarding the use of this medicine in children. Special care may be needed. Overdosage: If you think you have taken too much of this medicine contact a poison control center or emergency room at once. NOTE: This medicine is only for you. Do not share this medicine with others. What if I miss a dose? If you  miss a dose, take it as soon as you can. If it is almost time for your next dose, take only that dose. Do not take double or extra doses. What may interact with this medicine? Do not take this medicine with any of the following medications: -alcohol or any product that contains alcohol -amprenavir oral solution -cisapride -disulfiram -dofetilide -dronedarone -paclitaxel injection -pimozide -ritonavir oral solution -sertraline oral solution -sulfamethoxazole-trimethoprim injection -thioridazine -ziprasidone This medicine may also interact with the following medications: -birth control pills -cimetidine -lithium -other medicines that prolong the QT interval (cause an abnormal heart rhythm) -phenobarbital -phenytoin -warfarin This list may not describe all possible interactions. Give your health care provider a list of all the medicines, herbs, non-prescription drugs, or dietary supplements you use. Also tell them if you smoke, drink alcohol, or use illegal drugs. Some items may interact with your medicine. What should I watch for while using this medicine? Tell your doctor or health care professional if your symptoms do not improve or if they get worse. You may get drowsy or dizzy. Do not drive, use machinery, or do anything that needs mental alertness until you know how this medicine affects you. Do not stand or sit up quickly, especially if you are an older patient. This reduces the risk of dizzy or fainting spells. Avoid alcoholic drinks while you are taking this medicine and for three days afterward. Alcohol may make you feel dizzy, sick, or flushed. If you are being treated for a sexually transmitted disease, avoid sexual  contact until you have finished your treatment. Your sexual partner may also need treatment. What side effects may I notice from receiving this medicine? Side effects that you should report to your doctor or health care professional as soon as possible: -allergic  reactions like skin rash, itching or hives, swelling of the face, lips, or tongue -confusion, clumsiness -difficulty speaking -discolored or sore mouth -dizziness -fever, infection -numbness, tingling, pain or weakness in the hands or feet -trouble passing urine or change in the amount of urine -redness, blistering, peeling or loosening of the skin, including inside the mouth -seizures -unusually weak or tired -vaginal irritation, dryness, or discharge Side effects that usually do not require medical attention (report to your doctor or health care professional if they continue or are bothersome): -diarrhea -headache -irritability -metallic taste -nausea -stomach pain or cramps -trouble sleeping This list may not describe all possible side effects. Call your doctor for medical advice about side effects. You may report side effects to FDA at 1-800-FDA-1088. Where should I keep my medicine? Keep out of the reach of children. Store at room temperature between 15 and 30 degrees C (59 and 86 degrees F). Protect from light and moisture. Keep container tightly closed. Throw away any unused medicine after the expiration date. NOTE: This sheet is a summary. It may not cover all possible information. If you have questions about this medicine, talk to your doctor, pharmacist, or health care provider.  2018 Elsevier/Gold Standard (2013-06-02 14:05:10)   (Zithromax) azithromycin tablets What is this medicine? AZITHROMYCIN (az ith roe MYE sin) is a macrolide antibiotic. It is used to treat or prevent certain kinds of bacterial infections. It will not work for colds, flu, or other viral infections. This medicine may be used for other purposes; ask your health care provider or pharmacist if you have questions. COMMON BRAND NAME(S): Zithromax, Zithromax Tri-Pak, Zithromax Z-Pak What should I tell my health care provider before I take this medicine? They need to know if you have any of these  conditions: -kidney disease -liver disease -irregular heartbeat or heart disease -an unusual or allergic reaction to azithromycin, erythromycin, other macrolide antibiotics, foods, dyes, or preservatives -pregnant or trying to get pregnant -breast-feeding How should I use this medicine? Take this medicine by mouth with a full glass of water. Follow the directions on the prescription label. The tablets can be taken with food or on an empty stomach. If the medicine upsets your stomach, take it with food. Take your medicine at regular intervals. Do not take your medicine more often than directed. Take all of your medicine as directed even if you think your are better. Do not skip doses or stop your medicine early. Talk to your pediatrician regarding the use of this medicine in children. While this drug may be prescribed for children as young as 6 months for selected conditions, precautions do apply. Overdosage: If you think you have taken too much of this medicine contact a poison control center or emergency room at once. NOTE: This medicine is only for you. Do not share this medicine with others. What if I miss a dose? If you miss a dose, take it as soon as you can. If it is almost time for your next dose, take only that dose. Do not take double or extra doses. What may interact with this medicine? Do not take this medicine with any of the following medications: -lincomycin This medicine may also interact with the following medications: -amiodarone -antacids -birth control pills -cyclosporine -  digoxin -magnesium -nelfinavir -phenytoin -warfarin This list may not describe all possible interactions. Give your health care provider a list of all the medicines, herbs, non-prescription drugs, or dietary supplements you use. Also tell them if you smoke, drink alcohol, or use illegal drugs. Some items may interact with your medicine. What should I watch for while using this medicine? Tell your  doctor or healthcare professional if your symptoms do not start to get better or if they get worse. Do not treat diarrhea with over the counter products. Contact your doctor if you have diarrhea that lasts more than 2 days or if it is severe and watery. This medicine can make you more sensitive to the sun. Keep out of the sun. If you cannot avoid being in the sun, wear protective clothing and use sunscreen. Do not use sun lamps or tanning beds/booths. What side effects may I notice from receiving this medicine? Side effects that you should report to your doctor or health care professional as soon as possible: -allergic reactions like skin rash, itching or hives, swelling of the face, lips, or tongue -confusion, nightmares or hallucinations -dark urine -difficulty breathing -hearing loss -irregular heartbeat or chest pain -pain or difficulty passing urine -redness, blistering, peeling or loosening of the skin, including inside the mouth -white patches or sores in the mouth -yellowing of the eyes or skin Side effects that usually do not require medical attention (report to your doctor or health care professional if they continue or are bothersome): -diarrhea -dizziness, drowsiness -headache -stomach upset or vomiting -tooth discoloration -vaginal irritation This list may not describe all possible side effects. Call your doctor for medical advice about side effects. You may report side effects to FDA at 1-800-FDA-1088. Where should I keep my medicine? Keep out of the reach of children. Store at room temperature between 15 and 30 degrees C (59 and 86 degrees F). Throw away any unused medicine after the expiration date. NOTE: This sheet is a summary. It may not cover all possible information. If you have questions about this medicine, talk to your doctor, pharmacist, or health care provider.  2018 Elsevier/Gold Standard (2015-12-24 15:26:03)

## 2018-07-01 ENCOUNTER — Ambulatory Visit (INDEPENDENT_AMBULATORY_CARE_PROVIDER_SITE_OTHER): Payer: PPO

## 2018-07-01 DIAGNOSIS — F5105 Insomnia due to other mental disorder: Secondary | ICD-10-CM | POA: Diagnosis not present

## 2018-07-01 DIAGNOSIS — F410 Panic disorder [episodic paroxysmal anxiety] without agoraphobia: Secondary | ICD-10-CM | POA: Diagnosis not present

## 2018-07-01 DIAGNOSIS — F332 Major depressive disorder, recurrent severe without psychotic features: Secondary | ICD-10-CM | POA: Diagnosis not present

## 2018-07-01 DIAGNOSIS — R1031 Right lower quadrant pain: Secondary | ICD-10-CM

## 2018-07-01 LAB — CERVICOVAGINAL ANCILLARY ONLY
Bacterial vaginitis: POSITIVE — AB
CANDIDA VAGINITIS: NEGATIVE
CHLAMYDIA, DNA PROBE: NEGATIVE
NEISSERIA GONORRHEA: NEGATIVE
TRICH (WINDOWPATH): POSITIVE — AB

## 2018-07-05 DIAGNOSIS — F332 Major depressive disorder, recurrent severe without psychotic features: Secondary | ICD-10-CM | POA: Diagnosis not present

## 2018-07-05 DIAGNOSIS — F410 Panic disorder [episodic paroxysmal anxiety] without agoraphobia: Secondary | ICD-10-CM | POA: Diagnosis not present

## 2018-07-07 NOTE — Telephone Encounter (Signed)
No answer. Left message to call back.   

## 2018-07-08 DIAGNOSIS — M1711 Unilateral primary osteoarthritis, right knee: Secondary | ICD-10-CM | POA: Diagnosis not present

## 2018-07-08 DIAGNOSIS — M461 Sacroiliitis, not elsewhere classified: Secondary | ICD-10-CM | POA: Diagnosis not present

## 2018-07-08 DIAGNOSIS — M25562 Pain in left knee: Secondary | ICD-10-CM | POA: Diagnosis not present

## 2018-07-08 DIAGNOSIS — M25552 Pain in left hip: Secondary | ICD-10-CM | POA: Diagnosis not present

## 2018-07-08 DIAGNOSIS — R1031 Right lower quadrant pain: Secondary | ICD-10-CM | POA: Diagnosis not present

## 2018-07-13 ENCOUNTER — Encounter: Payer: Self-pay | Admitting: Obstetrics and Gynecology

## 2018-07-13 ENCOUNTER — Ambulatory Visit: Payer: PPO | Admitting: Obstetrics and Gynecology

## 2018-07-13 VITALS — BP 120/76 | HR 111 | Ht 62.0 in | Wt 142.3 lb

## 2018-07-13 DIAGNOSIS — R102 Pelvic and perineal pain: Secondary | ICD-10-CM

## 2018-07-13 DIAGNOSIS — N898 Other specified noninflammatory disorders of vagina: Secondary | ICD-10-CM | POA: Diagnosis not present

## 2018-07-13 DIAGNOSIS — A5901 Trichomonal vulvovaginitis: Secondary | ICD-10-CM | POA: Diagnosis not present

## 2018-07-13 NOTE — Patient Instructions (Addendum)
1.  Wet prep today reveals no evidence of infection 2.  Continue monitoring pain symptoms. 3.  Be sure that partner gets treated for trichomonas before engaging in relations. 4.  Pelvic ultrasound was normal. 5.  Return in 6 months for follow-up on right lower quadrant pain.

## 2018-07-14 DIAGNOSIS — R102 Pelvic and perineal pain: Secondary | ICD-10-CM | POA: Insufficient documentation

## 2018-07-14 DIAGNOSIS — A5901 Trichomonal vulvovaginitis: Secondary | ICD-10-CM | POA: Insufficient documentation

## 2018-07-14 DIAGNOSIS — M533 Sacrococcygeal disorders, not elsewhere classified: Secondary | ICD-10-CM | POA: Diagnosis not present

## 2018-07-14 NOTE — Progress Notes (Signed)
Chief complaint: 1.  Right lower quadrant pain 2.  Vaginal discharge  Patient presents for follow-up from 06/30/2018. Pelvic ultrasound was obtained for right lower quadrant pain.  Location: [facility] Date of Service:  07/01/2018  Indications: RLQ pain; Ablation; S/P right oophorectomy Findings:  The uterus measures 5.0 x 3.1 x 2.3 cm. Echo texture is homogenous without evidence of focal masses. Fluid is noted in the cervix.  The Endometrium measures 2.2 mm.  Right Ovary is surgically absent. Left Ovary measures 2.4 x 1.5 x 1.5 cm. It is normal appearance. (Seen transabdominally only) Survey of the adnexa demonstrates no adnexal masses. There is no free fluid in the cul de sac.  Impression: 1. Normal appearing uterus. 2. Fluid noted within the cervix. 3. Endometrium measures 2.2 mm. 4. Left ovary appears WNL.  Right oophorectomy.  Recommendations: 1.Clinical correlation with the patient's History and Physical Exam.  Dario Ave, RDMS Brayton Mars, MD  Berenis states that the pain in the right lower quadrant persists; she has ache-like discomfort which is not incapacitating.  She can still perform day-to-day functions.  He does take Tylenol for the pain.  The patient was treated with metronidazole for Trichomonas vaginitis.  She states that her vaginal discharge symptoms have diminished significantly, but she still has some persisting discharge.  She has not had sex with her prior partner since the diagnosis.  She does not know if her partner was treated for trichomonas.  Past medical history, past surgical history, problem list, medications, and allergies are reviewed  OBJECTIVE: BP 120/76   Pulse (!) 111   Ht 5\' 2"  (1.575 m)   Wt 142 lb 4.8 oz (64.5 kg)   BMI 26.03 kg/m  Pleasant female in no acute distress.  Alert and oriented.  Slightly anxious. Back: No CVA tenderness Abdomen: Soft, minimal lower pelvic tenderness without peritoneal signs.   No organomegaly. Pelvic: External genitalia-normal BUS-normal Vagina-minimal white secretions in the vault Cervix-no significant discharge, normal appearance; no cervical motion tenderness Uterus-midplane, mobile, minimally tender 1/4 Adnexa-nonpalpable; right lower quadrant slightly tender 1/4 Rectovaginal-normal external exam; internal exam deferred Extremities: No clubbing cyanosis or edema  PROCEDURE: Wet prep Normal saline-minimal white blood cells; no trichomonas; no clue cells KOH-no yeast  ASSESSMENT: 1.  Right lower quadrant pain, chronic, not affecting activities of daily living; pelvic ultrasound is normal; pain may be associated with adhesions 2.  Status post RSO 3.  History of trichomonas vaginitis, treated 4.  Wet prep today is negative for infection  PLAN: 1.  Wet prep as noted. 2.  Continue to monitor pelvic pain symptoms; take Tylenol as needed for pain relief 3.  Encourage condom use for protection during sexual relations 4.  Be sure partner is treated for trichomonas before engaging in intercourse 5.  Return in 6 months for follow-up on right lower quadrant pain  A total of 15 minutes were spent face-to-face with the patient during this encounter and over half of that time dealt with counseling and coordination of care.  Brayton Mars, MD  Note: This dictation was prepared with Dragon dictation along with smaller phrase technology. Any transcriptional errors that result from this process are unintentional.

## 2018-07-25 DIAGNOSIS — F410 Panic disorder [episodic paroxysmal anxiety] without agoraphobia: Secondary | ICD-10-CM | POA: Diagnosis not present

## 2018-07-25 DIAGNOSIS — F332 Major depressive disorder, recurrent severe without psychotic features: Secondary | ICD-10-CM | POA: Diagnosis not present

## 2018-07-27 DIAGNOSIS — R0789 Other chest pain: Secondary | ICD-10-CM | POA: Diagnosis not present

## 2018-07-27 DIAGNOSIS — R079 Chest pain, unspecified: Secondary | ICD-10-CM | POA: Diagnosis not present

## 2018-07-27 DIAGNOSIS — R42 Dizziness and giddiness: Secondary | ICD-10-CM | POA: Diagnosis not present

## 2018-07-27 DIAGNOSIS — I10 Essential (primary) hypertension: Secondary | ICD-10-CM | POA: Diagnosis not present

## 2018-08-09 DIAGNOSIS — K219 Gastro-esophageal reflux disease without esophagitis: Secondary | ICD-10-CM | POA: Diagnosis not present

## 2018-08-09 DIAGNOSIS — I2510 Atherosclerotic heart disease of native coronary artery without angina pectoris: Secondary | ICD-10-CM | POA: Diagnosis not present

## 2018-08-09 DIAGNOSIS — F329 Major depressive disorder, single episode, unspecified: Secondary | ICD-10-CM | POA: Diagnosis not present

## 2018-08-09 DIAGNOSIS — M170 Bilateral primary osteoarthritis of knee: Secondary | ICD-10-CM | POA: Diagnosis not present

## 2018-08-09 DIAGNOSIS — I509 Heart failure, unspecified: Secondary | ICD-10-CM | POA: Diagnosis not present

## 2018-08-09 DIAGNOSIS — E7800 Pure hypercholesterolemia, unspecified: Secondary | ICD-10-CM | POA: Diagnosis not present

## 2018-08-15 DIAGNOSIS — F332 Major depressive disorder, recurrent severe without psychotic features: Secondary | ICD-10-CM | POA: Diagnosis not present

## 2018-08-15 DIAGNOSIS — F410 Panic disorder [episodic paroxysmal anxiety] without agoraphobia: Secondary | ICD-10-CM | POA: Diagnosis not present

## 2018-08-24 DIAGNOSIS — Z1231 Encounter for screening mammogram for malignant neoplasm of breast: Secondary | ICD-10-CM | POA: Diagnosis not present

## 2018-09-01 ENCOUNTER — Other Ambulatory Visit: Payer: Self-pay | Admitting: Obstetrics and Gynecology

## 2018-09-15 DIAGNOSIS — F410 Panic disorder [episodic paroxysmal anxiety] without agoraphobia: Secondary | ICD-10-CM | POA: Diagnosis not present

## 2018-09-15 DIAGNOSIS — F5105 Insomnia due to other mental disorder: Secondary | ICD-10-CM | POA: Diagnosis not present

## 2018-09-15 DIAGNOSIS — F332 Major depressive disorder, recurrent severe without psychotic features: Secondary | ICD-10-CM | POA: Diagnosis not present

## 2018-09-17 ENCOUNTER — Other Ambulatory Visit: Payer: Self-pay | Admitting: Cardiovascular Disease

## 2018-09-20 DIAGNOSIS — F410 Panic disorder [episodic paroxysmal anxiety] without agoraphobia: Secondary | ICD-10-CM | POA: Diagnosis not present

## 2018-09-20 DIAGNOSIS — F332 Major depressive disorder, recurrent severe without psychotic features: Secondary | ICD-10-CM | POA: Diagnosis not present

## 2018-12-01 DIAGNOSIS — N898 Other specified noninflammatory disorders of vagina: Secondary | ICD-10-CM | POA: Diagnosis not present

## 2018-12-01 DIAGNOSIS — R51 Headache: Secondary | ICD-10-CM | POA: Diagnosis not present

## 2018-12-01 DIAGNOSIS — B9689 Other specified bacterial agents as the cause of diseases classified elsewhere: Secondary | ICD-10-CM | POA: Diagnosis not present

## 2018-12-01 DIAGNOSIS — N760 Acute vaginitis: Secondary | ICD-10-CM | POA: Diagnosis not present

## 2018-12-26 DIAGNOSIS — F5105 Insomnia due to other mental disorder: Secondary | ICD-10-CM | POA: Diagnosis not present

## 2018-12-26 DIAGNOSIS — F410 Panic disorder [episodic paroxysmal anxiety] without agoraphobia: Secondary | ICD-10-CM | POA: Diagnosis not present

## 2018-12-26 DIAGNOSIS — F332 Major depressive disorder, recurrent severe without psychotic features: Secondary | ICD-10-CM | POA: Diagnosis not present

## 2019-01-11 ENCOUNTER — Encounter: Payer: PPO | Admitting: Obstetrics and Gynecology

## 2019-02-02 DIAGNOSIS — E7800 Pure hypercholesterolemia, unspecified: Secondary | ICD-10-CM | POA: Diagnosis not present

## 2019-02-02 DIAGNOSIS — I509 Heart failure, unspecified: Secondary | ICD-10-CM | POA: Diagnosis not present

## 2019-02-09 DIAGNOSIS — Z0000 Encounter for general adult medical examination without abnormal findings: Secondary | ICD-10-CM | POA: Diagnosis not present

## 2019-02-09 DIAGNOSIS — I10 Essential (primary) hypertension: Secondary | ICD-10-CM | POA: Diagnosis not present

## 2019-02-09 DIAGNOSIS — J45909 Unspecified asthma, uncomplicated: Secondary | ICD-10-CM | POA: Diagnosis not present

## 2019-02-09 DIAGNOSIS — M5136 Other intervertebral disc degeneration, lumbar region: Secondary | ICD-10-CM | POA: Diagnosis not present

## 2019-02-09 DIAGNOSIS — E1169 Type 2 diabetes mellitus with other specified complication: Secondary | ICD-10-CM | POA: Diagnosis not present

## 2019-02-09 DIAGNOSIS — Z23 Encounter for immunization: Secondary | ICD-10-CM | POA: Diagnosis not present

## 2019-02-09 DIAGNOSIS — E785 Hyperlipidemia, unspecified: Secondary | ICD-10-CM | POA: Diagnosis not present

## 2019-02-09 DIAGNOSIS — E119 Type 2 diabetes mellitus without complications: Secondary | ICD-10-CM | POA: Diagnosis not present

## 2019-02-09 DIAGNOSIS — F329 Major depressive disorder, single episode, unspecified: Secondary | ICD-10-CM | POA: Diagnosis not present

## 2019-02-09 DIAGNOSIS — E7800 Pure hypercholesterolemia, unspecified: Secondary | ICD-10-CM | POA: Diagnosis not present

## 2019-02-09 DIAGNOSIS — M545 Low back pain: Secondary | ICD-10-CM | POA: Diagnosis not present

## 2019-02-09 DIAGNOSIS — J329 Chronic sinusitis, unspecified: Secondary | ICD-10-CM | POA: Diagnosis not present

## 2019-02-09 DIAGNOSIS — I2510 Atherosclerotic heart disease of native coronary artery without angina pectoris: Secondary | ICD-10-CM | POA: Diagnosis not present

## 2019-02-09 DIAGNOSIS — Z1211 Encounter for screening for malignant neoplasm of colon: Secondary | ICD-10-CM | POA: Diagnosis not present

## 2019-02-09 DIAGNOSIS — Z0001 Encounter for general adult medical examination with abnormal findings: Secondary | ICD-10-CM | POA: Diagnosis not present

## 2019-02-09 DIAGNOSIS — S81832A Puncture wound without foreign body, left lower leg, initial encounter: Secondary | ICD-10-CM | POA: Diagnosis not present

## 2019-02-09 DIAGNOSIS — J40 Bronchitis, not specified as acute or chronic: Secondary | ICD-10-CM | POA: Diagnosis not present

## 2019-02-09 DIAGNOSIS — F418 Other specified anxiety disorders: Secondary | ICD-10-CM | POA: Diagnosis not present

## 2019-02-09 DIAGNOSIS — M9903 Segmental and somatic dysfunction of lumbar region: Secondary | ICD-10-CM | POA: Diagnosis not present

## 2019-02-09 DIAGNOSIS — Z6837 Body mass index (BMI) 37.0-37.9, adult: Secondary | ICD-10-CM | POA: Diagnosis not present

## 2019-02-28 ENCOUNTER — Encounter: Payer: PPO | Admitting: Obstetrics and Gynecology

## 2019-03-23 DIAGNOSIS — F332 Major depressive disorder, recurrent severe without psychotic features: Secondary | ICD-10-CM | POA: Diagnosis not present

## 2019-03-23 DIAGNOSIS — F410 Panic disorder [episodic paroxysmal anxiety] without agoraphobia: Secondary | ICD-10-CM | POA: Diagnosis not present

## 2019-03-23 DIAGNOSIS — F5105 Insomnia due to other mental disorder: Secondary | ICD-10-CM | POA: Diagnosis not present

## 2019-04-30 DIAGNOSIS — Z20828 Contact with and (suspected) exposure to other viral communicable diseases: Secondary | ICD-10-CM | POA: Diagnosis not present

## 2019-05-23 DIAGNOSIS — N390 Urinary tract infection, site not specified: Secondary | ICD-10-CM | POA: Diagnosis not present

## 2019-05-23 DIAGNOSIS — R300 Dysuria: Secondary | ICD-10-CM | POA: Diagnosis not present

## 2019-05-31 DIAGNOSIS — M1711 Unilateral primary osteoarthritis, right knee: Secondary | ICD-10-CM | POA: Diagnosis not present

## 2019-06-14 DIAGNOSIS — R350 Frequency of micturition: Secondary | ICD-10-CM | POA: Diagnosis not present

## 2019-06-14 DIAGNOSIS — N390 Urinary tract infection, site not specified: Secondary | ICD-10-CM | POA: Diagnosis not present

## 2019-06-26 DIAGNOSIS — F5105 Insomnia due to other mental disorder: Secondary | ICD-10-CM | POA: Diagnosis not present

## 2019-06-26 DIAGNOSIS — F332 Major depressive disorder, recurrent severe without psychotic features: Secondary | ICD-10-CM | POA: Diagnosis not present

## 2019-06-26 DIAGNOSIS — F410 Panic disorder [episodic paroxysmal anxiety] without agoraphobia: Secondary | ICD-10-CM | POA: Diagnosis not present

## 2019-07-10 DIAGNOSIS — F332 Major depressive disorder, recurrent severe without psychotic features: Secondary | ICD-10-CM | POA: Diagnosis not present

## 2019-07-10 DIAGNOSIS — F410 Panic disorder [episodic paroxysmal anxiety] without agoraphobia: Secondary | ICD-10-CM | POA: Diagnosis not present

## 2019-07-10 DIAGNOSIS — F5105 Insomnia due to other mental disorder: Secondary | ICD-10-CM | POA: Diagnosis not present

## 2019-07-11 DIAGNOSIS — Z8616 Personal history of COVID-19: Secondary | ICD-10-CM

## 2019-07-11 HISTORY — DX: Personal history of COVID-19: Z86.16

## 2019-07-24 DIAGNOSIS — M1711 Unilateral primary osteoarthritis, right knee: Secondary | ICD-10-CM | POA: Diagnosis not present

## 2019-07-31 DIAGNOSIS — M1711 Unilateral primary osteoarthritis, right knee: Secondary | ICD-10-CM | POA: Diagnosis not present

## 2019-08-01 DIAGNOSIS — F410 Panic disorder [episodic paroxysmal anxiety] without agoraphobia: Secondary | ICD-10-CM | POA: Diagnosis not present

## 2019-08-01 DIAGNOSIS — F332 Major depressive disorder, recurrent severe without psychotic features: Secondary | ICD-10-CM | POA: Diagnosis not present

## 2019-08-07 DIAGNOSIS — F5105 Insomnia due to other mental disorder: Secondary | ICD-10-CM | POA: Diagnosis not present

## 2019-08-07 DIAGNOSIS — M1711 Unilateral primary osteoarthritis, right knee: Secondary | ICD-10-CM | POA: Diagnosis not present

## 2019-08-07 DIAGNOSIS — F332 Major depressive disorder, recurrent severe without psychotic features: Secondary | ICD-10-CM | POA: Diagnosis not present

## 2019-08-07 DIAGNOSIS — F410 Panic disorder [episodic paroxysmal anxiety] without agoraphobia: Secondary | ICD-10-CM | POA: Diagnosis not present

## 2019-08-15 DIAGNOSIS — I509 Heart failure, unspecified: Secondary | ICD-10-CM | POA: Diagnosis not present

## 2019-08-15 DIAGNOSIS — I10 Essential (primary) hypertension: Secondary | ICD-10-CM | POA: Diagnosis not present

## 2019-08-15 DIAGNOSIS — I2510 Atherosclerotic heart disease of native coronary artery without angina pectoris: Secondary | ICD-10-CM | POA: Diagnosis not present

## 2019-08-15 DIAGNOSIS — F329 Major depressive disorder, single episode, unspecified: Secondary | ICD-10-CM | POA: Diagnosis not present

## 2019-08-15 DIAGNOSIS — E7800 Pure hypercholesterolemia, unspecified: Secondary | ICD-10-CM | POA: Diagnosis not present

## 2019-08-15 DIAGNOSIS — R300 Dysuria: Secondary | ICD-10-CM | POA: Diagnosis not present

## 2019-08-17 DIAGNOSIS — F332 Major depressive disorder, recurrent severe without psychotic features: Secondary | ICD-10-CM | POA: Diagnosis not present

## 2019-08-17 DIAGNOSIS — F410 Panic disorder [episodic paroxysmal anxiety] without agoraphobia: Secondary | ICD-10-CM | POA: Diagnosis not present

## 2019-08-23 DIAGNOSIS — F410 Panic disorder [episodic paroxysmal anxiety] without agoraphobia: Secondary | ICD-10-CM | POA: Diagnosis not present

## 2019-08-23 DIAGNOSIS — F5105 Insomnia due to other mental disorder: Secondary | ICD-10-CM | POA: Diagnosis not present

## 2019-08-23 DIAGNOSIS — F332 Major depressive disorder, recurrent severe without psychotic features: Secondary | ICD-10-CM | POA: Diagnosis not present

## 2019-09-05 DIAGNOSIS — F332 Major depressive disorder, recurrent severe without psychotic features: Secondary | ICD-10-CM | POA: Diagnosis not present

## 2019-09-05 DIAGNOSIS — F410 Panic disorder [episodic paroxysmal anxiety] without agoraphobia: Secondary | ICD-10-CM | POA: Diagnosis not present

## 2019-09-06 DIAGNOSIS — F332 Major depressive disorder, recurrent severe without psychotic features: Secondary | ICD-10-CM | POA: Diagnosis not present

## 2019-09-13 DIAGNOSIS — F332 Major depressive disorder, recurrent severe without psychotic features: Secondary | ICD-10-CM | POA: Diagnosis not present

## 2019-09-14 DIAGNOSIS — F332 Major depressive disorder, recurrent severe without psychotic features: Secondary | ICD-10-CM | POA: Diagnosis not present

## 2019-09-15 DIAGNOSIS — F332 Major depressive disorder, recurrent severe without psychotic features: Secondary | ICD-10-CM | POA: Diagnosis not present

## 2019-09-18 DIAGNOSIS — F332 Major depressive disorder, recurrent severe without psychotic features: Secondary | ICD-10-CM | POA: Diagnosis not present

## 2019-09-19 DIAGNOSIS — F332 Major depressive disorder, recurrent severe without psychotic features: Secondary | ICD-10-CM | POA: Diagnosis not present

## 2019-09-19 DIAGNOSIS — F410 Panic disorder [episodic paroxysmal anxiety] without agoraphobia: Secondary | ICD-10-CM | POA: Diagnosis not present

## 2019-09-19 DIAGNOSIS — F5105 Insomnia due to other mental disorder: Secondary | ICD-10-CM | POA: Diagnosis not present

## 2019-09-20 DIAGNOSIS — F332 Major depressive disorder, recurrent severe without psychotic features: Secondary | ICD-10-CM | POA: Diagnosis not present

## 2019-09-21 DIAGNOSIS — F332 Major depressive disorder, recurrent severe without psychotic features: Secondary | ICD-10-CM | POA: Diagnosis not present

## 2019-09-22 DIAGNOSIS — F332 Major depressive disorder, recurrent severe without psychotic features: Secondary | ICD-10-CM | POA: Diagnosis not present

## 2019-09-25 DIAGNOSIS — F332 Major depressive disorder, recurrent severe without psychotic features: Secondary | ICD-10-CM | POA: Diagnosis not present

## 2019-09-26 DIAGNOSIS — F332 Major depressive disorder, recurrent severe without psychotic features: Secondary | ICD-10-CM | POA: Diagnosis not present

## 2019-09-27 DIAGNOSIS — F332 Major depressive disorder, recurrent severe without psychotic features: Secondary | ICD-10-CM | POA: Diagnosis not present

## 2019-09-28 DIAGNOSIS — F332 Major depressive disorder, recurrent severe without psychotic features: Secondary | ICD-10-CM | POA: Diagnosis not present

## 2019-09-29 DIAGNOSIS — F332 Major depressive disorder, recurrent severe without psychotic features: Secondary | ICD-10-CM | POA: Diagnosis not present

## 2019-10-04 DIAGNOSIS — F332 Major depressive disorder, recurrent severe without psychotic features: Secondary | ICD-10-CM | POA: Diagnosis not present

## 2019-10-04 DIAGNOSIS — N390 Urinary tract infection, site not specified: Secondary | ICD-10-CM | POA: Diagnosis not present

## 2019-10-04 DIAGNOSIS — R399 Unspecified symptoms and signs involving the genitourinary system: Secondary | ICD-10-CM | POA: Diagnosis not present

## 2019-10-04 DIAGNOSIS — A499 Bacterial infection, unspecified: Secondary | ICD-10-CM | POA: Diagnosis not present

## 2019-10-09 DIAGNOSIS — F332 Major depressive disorder, recurrent severe without psychotic features: Secondary | ICD-10-CM | POA: Diagnosis not present

## 2019-10-10 DIAGNOSIS — F410 Panic disorder [episodic paroxysmal anxiety] without agoraphobia: Secondary | ICD-10-CM | POA: Diagnosis not present

## 2019-10-10 DIAGNOSIS — F5105 Insomnia due to other mental disorder: Secondary | ICD-10-CM | POA: Diagnosis not present

## 2019-10-10 DIAGNOSIS — J019 Acute sinusitis, unspecified: Secondary | ICD-10-CM | POA: Diagnosis not present

## 2019-10-10 DIAGNOSIS — F332 Major depressive disorder, recurrent severe without psychotic features: Secondary | ICD-10-CM | POA: Diagnosis not present

## 2019-10-10 DIAGNOSIS — R0602 Shortness of breath: Secondary | ICD-10-CM | POA: Diagnosis not present

## 2019-10-13 DIAGNOSIS — F332 Major depressive disorder, recurrent severe without psychotic features: Secondary | ICD-10-CM | POA: Diagnosis not present

## 2019-10-16 DIAGNOSIS — F332 Major depressive disorder, recurrent severe without psychotic features: Secondary | ICD-10-CM | POA: Diagnosis not present

## 2019-10-17 DIAGNOSIS — F332 Major depressive disorder, recurrent severe without psychotic features: Secondary | ICD-10-CM | POA: Diagnosis not present

## 2019-10-17 DIAGNOSIS — Z20828 Contact with and (suspected) exposure to other viral communicable diseases: Secondary | ICD-10-CM | POA: Diagnosis not present

## 2019-10-18 DIAGNOSIS — F332 Major depressive disorder, recurrent severe without psychotic features: Secondary | ICD-10-CM | POA: Diagnosis not present

## 2019-10-19 DIAGNOSIS — F332 Major depressive disorder, recurrent severe without psychotic features: Secondary | ICD-10-CM | POA: Diagnosis not present

## 2019-10-20 DIAGNOSIS — F332 Major depressive disorder, recurrent severe without psychotic features: Secondary | ICD-10-CM | POA: Diagnosis not present

## 2019-10-23 DIAGNOSIS — F332 Major depressive disorder, recurrent severe without psychotic features: Secondary | ICD-10-CM | POA: Diagnosis not present

## 2019-10-24 DIAGNOSIS — F332 Major depressive disorder, recurrent severe without psychotic features: Secondary | ICD-10-CM | POA: Diagnosis not present

## 2019-10-26 DIAGNOSIS — F332 Major depressive disorder, recurrent severe without psychotic features: Secondary | ICD-10-CM | POA: Diagnosis not present

## 2019-10-27 DIAGNOSIS — F332 Major depressive disorder, recurrent severe without psychotic features: Secondary | ICD-10-CM | POA: Diagnosis not present

## 2019-10-30 DIAGNOSIS — F332 Major depressive disorder, recurrent severe without psychotic features: Secondary | ICD-10-CM | POA: Diagnosis not present

## 2019-10-31 DIAGNOSIS — F332 Major depressive disorder, recurrent severe without psychotic features: Secondary | ICD-10-CM | POA: Diagnosis not present

## 2019-11-01 DIAGNOSIS — F332 Major depressive disorder, recurrent severe without psychotic features: Secondary | ICD-10-CM | POA: Diagnosis not present

## 2019-11-02 DIAGNOSIS — F332 Major depressive disorder, recurrent severe without psychotic features: Secondary | ICD-10-CM | POA: Diagnosis not present

## 2019-11-06 DIAGNOSIS — F332 Major depressive disorder, recurrent severe without psychotic features: Secondary | ICD-10-CM | POA: Diagnosis not present

## 2019-11-08 DIAGNOSIS — F332 Major depressive disorder, recurrent severe without psychotic features: Secondary | ICD-10-CM | POA: Diagnosis not present

## 2019-11-13 DIAGNOSIS — K047 Periapical abscess without sinus: Secondary | ICD-10-CM | POA: Diagnosis not present

## 2019-11-13 DIAGNOSIS — R519 Headache, unspecified: Secondary | ICD-10-CM | POA: Diagnosis not present

## 2019-11-13 DIAGNOSIS — F332 Major depressive disorder, recurrent severe without psychotic features: Secondary | ICD-10-CM | POA: Diagnosis not present

## 2019-11-15 DIAGNOSIS — F332 Major depressive disorder, recurrent severe without psychotic features: Secondary | ICD-10-CM | POA: Diagnosis not present

## 2019-11-20 DIAGNOSIS — F332 Major depressive disorder, recurrent severe without psychotic features: Secondary | ICD-10-CM | POA: Diagnosis not present

## 2019-11-22 DIAGNOSIS — F332 Major depressive disorder, recurrent severe without psychotic features: Secondary | ICD-10-CM | POA: Diagnosis not present

## 2019-11-24 DIAGNOSIS — F5105 Insomnia due to other mental disorder: Secondary | ICD-10-CM | POA: Diagnosis not present

## 2019-11-24 DIAGNOSIS — F332 Major depressive disorder, recurrent severe without psychotic features: Secondary | ICD-10-CM | POA: Diagnosis not present

## 2019-11-24 DIAGNOSIS — F410 Panic disorder [episodic paroxysmal anxiety] without agoraphobia: Secondary | ICD-10-CM | POA: Diagnosis not present

## 2019-12-26 DIAGNOSIS — Z20822 Contact with and (suspected) exposure to covid-19: Secondary | ICD-10-CM | POA: Diagnosis not present

## 2020-01-03 DIAGNOSIS — M25461 Effusion, right knee: Secondary | ICD-10-CM | POA: Diagnosis not present

## 2020-01-03 DIAGNOSIS — M1711 Unilateral primary osteoarthritis, right knee: Secondary | ICD-10-CM | POA: Diagnosis not present

## 2020-01-03 DIAGNOSIS — R399 Unspecified symptoms and signs involving the genitourinary system: Secondary | ICD-10-CM | POA: Diagnosis not present

## 2020-01-10 DIAGNOSIS — F332 Major depressive disorder, recurrent severe without psychotic features: Secondary | ICD-10-CM | POA: Diagnosis not present

## 2020-01-11 ENCOUNTER — Telehealth: Payer: Self-pay | Admitting: Cardiovascular Disease

## 2020-01-11 NOTE — Telephone Encounter (Signed)
3 attempts to schedule fu appt from recall list.   Deleting recall.   

## 2020-02-04 NOTE — Progress Notes (Signed)
Cardiology Office Note  Date:  02/05/2020   ID:  Tacy, Bredesen June 27, 1956, MRN [id number]  PCP:  Baxter Hire, MD   Chief Complaint  Patient presents with  . office visit    Intermittent episodes of tachycardia with difficulty breathing and pain; Meds verbally reviewed with patient.    HPI:  Ms. Cordell is a 64 year old woman with long history of  smoking who stopped in May 2014,  coronary artery disease with non-ST elevation in may 2015 with stent placed to her distal RCA,  stroke October 2014 evaluated at [facility].  Previous anginal symptoms included back pain, chest pain like an elephant sitting on her chest cardiac catheterization performed for anginal symptoms, Cath 08/19/2016:, No intervention performed  She presents for routine followup of her coronary artery disease.   covid positive sept 2020 SOB, lost taste Can taste salt When sitting has SOB, trouble taking deep breath, sometimes 5 min Scares her Sitting, Heart rate 120 bpm  Still smokers, minimal Knee pain, on right, seen by orthopedics May need surgery  No chest pain on exertion Rare NTG  No regular exercise program  Long review of her cholesterol Previously was much improved 2016 but does not know how she got those numbers down  She has severe ostio arthritis of the knee, bone-on-bone  EKG personally reviewed by myself on todays visit Shows normal sinus rhythm with rate 84 bpm T wave abnormality V4 to V6 No significant change compared to prior EKG   past cardiac catheterization history detailed below  cardiac catheterization  08/19/2016: Left anterior descending (LAD):30% proximal LAD disease, 40% mid LAD disease. 70% ostial and proximal disease of the D2 and D3 vessel Right coronary artery (RCA):40 to 50% ostial disease, 30% proximal disease.  Left ventriculography: Left ventricular systolic function is normal, LVEF is estimated at 55-65%, there is no significant mitral regurgitation , no  significant aortic valve stenosis  Conclusions:  Stable mild to moderate LAD disease Moderate to severe ostial and proximal diagonal disease (unchanged or improved from previous study) Mild to Moderate ostial RCA disease, mild proximal RCA disease Normal EF No intervention performed   she was noted to have some coronary spasm that resolved with nitroglycerin   hospitalization 12/31/2013 where she presented with angina. He had a catheterization with distal RCA stent placed.  Also started on ranexa, continued on isosorbide.   Previous Cardiac catheterization report 01/01/2014 details 95% distal RCA lesion with stent placed. Also had 40% mid LAD, 30% distal LAD, 80% D1 disease, 60% D2 disease, 50% ostial RCA disease, 50% proximal RCA disease, 60% mid RCA disease stent placed was a xience  EX 2.5 millimeter stent  Cardiac catheterization in 03/16/2013 details occluded mid RCA with collaterals from distal LAD also 75% stenosis diagonal #1 near the ostium Ejection fraction at that time was 46% notes also detailed 40% mid LAD, 30% distal LAD, 50% D2 disease, 30% proximal circumflex, 75% proximal RCA disease  PMH:   has a past medical history of Anxiety, Arthritis, CAD (coronary artery disease), Carotid disease, bilateral ([place]), Complication of anesthesia, CVA (cerebral vascular accident) ([place]), Decreased libido, Depression, GERD (gastroesophageal reflux disease), History of recurrent UTIs, Hypercholesteremia, Hyperlipemia, Hypertension, Insomnia, Ischemic cardiomyopathy, Menopause, Migraines, Myocardial infarction ([place]), PONV (postoperative nausea and vomiting), Right lower quadrant pain, Syncope and collapse, Tobacco abuse, and Vaginal atrophy.  PSH:    Past Surgical History:  Procedure Laterality Date  . CARDIAC CATHETERIZATION  01/01/2014  . CARDIAC CATHETERIZATION  03/16/2013  .  CARDIAC CATHETERIZATION  12/2013  . CARDIAC CATHETERIZATION N/A 08/19/2016   Procedure: Left Heart Cath and  Coronary Angiography;  Surgeon: Minna Merritts, MD;  Location: [facility];  Service: Cardiovascular;  Laterality: N/A;  . CESAREAN SECTION WITH BILATERAL TUBAL LIGATION    . CORONARY ANGIOPLASTY WITH STENT PLACEMENT  01/01/2014   95% lesion with a drug eluting stent to the distal RCA.  Marland Kitchen ENDOMETRIAL ABLATION    . KNEE ARTHROSCOPY  11/27/2006   left knee   . OOPHORECTOMY    . TOTAL KNEE ARTHROPLASTY Left 10/06/2016   Procedure: TOTAL KNEE ARTHROPLASTY;  Surgeon: Corky Mull, MD;  Location: [facility];  Service: Orthopedics;  Laterality: Left;  . TUBAL LIGATION      Current Outpatient Medications  Medication Sig Dispense Refill  . acetaminophen (TYLENOL) 500 MG tablet Take 500 mg by mouth every 6 (six) hours as needed for mild pain or moderate pain.     Marland Kitchen aspirin 81 MG tablet Take 81 mg by mouth daily.    Marland Kitchen atorvastatin (LIPITOR) 80 MG tablet Take 1 tablet (80 mg total) by mouth at bedtime. 90 tablet 4  . buPROPion (WELLBUTRIN SR) 100 MG 12 hr tablet TK 1 T PO D AT 8 AM AND 1 T AT 3 PM    . Cholecalciferol (VITAMIN D PO) Take 1 capsule by mouth daily.    . furosemide (LASIX) 20 MG tablet Take 1 tablet (20 mg total) by mouth daily as needed for fluid or edema. 90 tablet 1  . hydrOXYzine (ATARAX/VISTARIL) 25 MG tablet Take 25mg s twice daily as needed for anxiety  3  . metoprolol succinate (TOPROL-XL) 25 MG 24 hr tablet Take 1 tablet (25 mg total) by mouth every morning. 90 tablet 3  . nitroGLYCERIN (NITROSTAT) 0.4 MG SL tablet Place 1 tablet (0.4 mg total) under the tongue every 5 (five) minutes as needed for chest pain. 25 tablet 2  . pantoprazole (PROTONIX) 40 MG tablet Take 40 mg by mouth at bedtime.     . potassium chloride SA (K-DUR,KLOR-CON) 20 MEQ tablet Take 1 tablet (20 mEq total) by mouth daily as needed. 30 tablet 3  . triamterene-hydrochlorothiazide (DYAZIDE) 37.5-25 MG capsule TAKE 1 TABLET BY MOUTH DAILY  5  . zolpidem (AMBIEN) 10 MG tablet Take 10 mg by mouth at  bedtime as needed for sleep.    Marland Kitchen albuterol (VENTOLIN HFA) 108 (90 Base) MCG/ACT inhaler INHALE 2 PUFFS INTO THE LUNGS EVERY 6 HOURS AS NEEDED FOR WHEEZING OR SHORTNESS OF BREATH 54 g 0  . ezetimibe (ZETIA) 10 MG tablet Take 1 tablet (10 mg total) by mouth daily. 90 tablet 4  . ezetimibe (ZETIA) 10 MG tablet TAKE 1 TABLET(10 MG) BY MOUTH DAILY 90 tablet 2  . nitrofurantoin, macrocrystal-monohydrate, (MACROBID) 100 MG capsule Take 100 mg by mouth at bedtime.    . nitrofurantoin, macrocrystal-monohydrate, (MACROBID) 100 MG capsule TAKE 1 CAPSULE(100 MG) BY MOUTH AT BEDTIME 90 capsule 0   No current facility-administered medications for this visit.     Allergies:   Codeine sulfate and Penicillins   Social History:  The patient  reports that she quit smoking about 6 years ago. Her smoking use included cigarettes. She has a 5.00 pack-year smoking history. She has never used smokeless tobacco. She reports current alcohol use. She reports that she does not use drugs.   Family History:   family history includes Breast cancer in her paternal grandmother; Hyperlipidemia in her mother; Hypertension in  her mother; Stroke in her mother.    Review of Systems: Review of Systems  Constitutional: Negative.   HENT: Negative.   Respiratory: Negative.   Cardiovascular: Negative.   Gastrointestinal: Negative.   Musculoskeletal: Positive for joint pain.       Knee pain  Neurological: Negative.   Psychiatric/Behavioral: Negative.   All other systems reviewed and are negative.   PHYSICAL EXAM: VS:  BP 120/80 (BP Location: Left Arm, Patient Position: Sitting, Cuff Size: Normal)   Pulse 84   Ht 5\' 2"  (1.575 m)   Wt 143 lb 2 oz (64.9 kg)   SpO2 98%   BMI 26.18 kg/m  , BMI Body mass index is 26.18 kg/m. Constitutional:  oriented to person, place, and time. No distress.  HENT:  Head: Grossly normal Eyes:  no discharge. No scleral icterus.  Neck: No JVD, no carotid bruits  Cardiovascular: Regular  rate and rhythm, no murmurs appreciated Pulmonary/Chest: Clear to auscultation bilaterally, no wheezes or rails Abdominal: Soft.  no distension.  no tenderness.  Musculoskeletal: Normal range of motion Neurological:  normal muscle tone. Coordination normal. No atrophy Skin: Skin warm and dry Psychiatric: normal affect, pleasant   Recent Labs: No results found for requested labs within last 8760 hours.    Lipid Panel Lab Results  Component Value Date   CHOL 156 02/18/2015   HDL 55 02/18/2015   LDLCALC 69 02/18/2015   TRIG 162 (H) 02/18/2015      Wt Readings from Last 3 Encounters:  02/05/20 143 lb 2 oz (64.9 kg)  07/13/18 142 lb 4.8 oz (64.5 kg)  06/30/18 147 lb 12.8 oz (67 kg)       ASSESSMENT AND PLAN:  CAD in native artery - Plan: EKG 12-Lead Does not think that she is on simvastatin or Lipitor Statin myalgias in the past Next that she might be on rosuvastatin, she will check at home and call us back Notes from primary care have her on Lipitor  Essential hypertension - Plan: EKG 12-Lead Blood pressure is well controlled on today's visit. No changes made to the medications.  Pure hypercholesterolemia Previously on Lipitor Zetia Feels that she might be on rosuvastatin but not clear Consider PCSK9 inhibitor Goal LDL less than 70  Bilateral carotid artery disease ([place]) Stressed importance of smoking cessation, aggressive lipid panel Last ultrasound 2016, less than 39% disease bilaterally  Angina pectoris ([place]) Currently without chest pain symptoms. Stable disease on cardiac catheterization. In 2017  Rare nitro No further ischemic work-up at this time  History of coronary artery stent placement On aspirin with no Plavix    Total encounter time more than 25 minutes  Greater than 50% was spent in counseling and coordination of care with the patient  Disposition:   F/U  12 months   Orders Placed This Encounter  Procedures  . EKG 12-Lead      Signed, Esmond Plants, M.D., Ph.D. 02/05/2020  [place], [place]

## 2020-02-05 ENCOUNTER — Ambulatory Visit (INDEPENDENT_AMBULATORY_CARE_PROVIDER_SITE_OTHER): Payer: PPO | Admitting: Cardiovascular Disease

## 2020-02-05 ENCOUNTER — Encounter: Payer: Self-pay | Admitting: Cardiovascular Disease

## 2020-02-05 ENCOUNTER — Other Ambulatory Visit: Payer: Self-pay

## 2020-02-05 ENCOUNTER — Other Ambulatory Visit: Payer: Self-pay | Admitting: Cardiovascular Disease

## 2020-02-05 VITALS — BP 120/80 | HR 84 | Ht 62.0 in | Wt 143.1 lb

## 2020-02-05 DIAGNOSIS — I5022 Chronic systolic (congestive) heart failure: Secondary | ICD-10-CM

## 2020-02-05 DIAGNOSIS — I25118 Atherosclerotic heart disease of native coronary artery with other forms of angina pectoris: Secondary | ICD-10-CM | POA: Diagnosis not present

## 2020-02-05 DIAGNOSIS — I6523 Occlusion and stenosis of bilateral carotid arteries: Secondary | ICD-10-CM | POA: Diagnosis not present

## 2020-02-05 DIAGNOSIS — I10 Essential (primary) hypertension: Secondary | ICD-10-CM | POA: Diagnosis not present

## 2020-02-05 DIAGNOSIS — I639 Cerebral infarction, unspecified: Secondary | ICD-10-CM

## 2020-02-05 DIAGNOSIS — I951 Orthostatic hypotension: Secondary | ICD-10-CM

## 2020-02-05 DIAGNOSIS — E782 Mixed hyperlipidemia: Secondary | ICD-10-CM

## 2020-02-05 DIAGNOSIS — I739 Peripheral vascular disease, unspecified: Secondary | ICD-10-CM

## 2020-02-05 MED ORDER — FUROSEMIDE 20 MG PO TABS: 20.0000 mg | ORAL_TABLET | Freq: Every day | ORAL | 1 refills | Status: DC | PRN

## 2020-02-05 MED ORDER — ALBUTEROL SULFATE HFA 108 (90 BASE) MCG/ACT IN AERS: 2.0000 | INHALATION_SPRAY | Freq: Four times a day (QID) | RESPIRATORY_TRACT | 1 refills | Status: DC | PRN

## 2020-02-05 MED ORDER — NITROGLYCERIN 0.4 MG SL SUBL: 0.4000 mg | SUBLINGUAL_TABLET | SUBLINGUAL | 2 refills | Status: DC | PRN

## 2020-02-05 NOTE — Telephone Encounter (Signed)
Pt requesting 90 day refill. Please advise if ok to refill Albuterol inhaler 18 gm for 90 days.

## 2020-02-05 NOTE — Telephone Encounter (Signed)
Please contact PCP for future refills

## 2020-02-05 NOTE — Patient Instructions (Addendum)
Medication Instructions:  Albuterol inhaler PRN for SOB  Double check the cholesterol medication Call with name  If you need a refill on your cardiac medications before your next appointment, please call your pharmacy.    Lab work: No new labs needed   If you have labs (blood work) drawn today and your tests are completely normal, you will receive your results only by: Marland Kitchen MyChart Message (if you have MyChart) OR . A paper copy in the mail If you have any lab test that is abnormal or we need to change your treatment, we will call you to review the results.   Testing/Procedures: No new testing needed   Follow-Up: At [facility], you and your health needs are our priority.  As part of our continuing mission to provide you with exceptional heart care, we have created designated Provider Care Teams.  These Care Teams include your primary Cardiologist (physician) and Advanced Practice Providers (APPs -  Physician Assistants and Nurse Practitioners) who all work together to provide you with the care you need, when you need it.  . You will need a follow up appointment in 12 months   . Providers on your designated Care Team:   . Murray Hodgkins, NP . Christell Faith, PA-C . Marrianne Mood, PA-C  Any Other Special Instructions Will Be Listed Below (If Applicable).  For educational health videos Log in to : [email or web address] Or : [email or web address], password : [email or web address]

## 2020-02-06 DIAGNOSIS — E7800 Pure hypercholesterolemia, unspecified: Secondary | ICD-10-CM | POA: Diagnosis not present

## 2020-02-06 DIAGNOSIS — I10 Essential (primary) hypertension: Secondary | ICD-10-CM | POA: Diagnosis not present

## 2020-02-13 DIAGNOSIS — Z0000 Encounter for general adult medical examination without abnormal findings: Secondary | ICD-10-CM | POA: Diagnosis not present

## 2020-02-13 DIAGNOSIS — F411 Generalized anxiety disorder: Secondary | ICD-10-CM | POA: Diagnosis not present

## 2020-02-13 DIAGNOSIS — E7800 Pure hypercholesterolemia, unspecified: Secondary | ICD-10-CM | POA: Diagnosis not present

## 2020-02-13 DIAGNOSIS — I10 Essential (primary) hypertension: Secondary | ICD-10-CM | POA: Diagnosis not present

## 2020-02-13 DIAGNOSIS — M1711 Unilateral primary osteoarthritis, right knee: Secondary | ICD-10-CM | POA: Diagnosis not present

## 2020-02-13 DIAGNOSIS — Z1231 Encounter for screening mammogram for malignant neoplasm of breast: Secondary | ICD-10-CM | POA: Diagnosis not present

## 2020-02-13 DIAGNOSIS — F329 Major depressive disorder, single episode, unspecified: Secondary | ICD-10-CM | POA: Diagnosis not present

## 2020-02-13 DIAGNOSIS — I5022 Chronic systolic (congestive) heart failure: Secondary | ICD-10-CM | POA: Diagnosis not present

## 2020-02-13 DIAGNOSIS — Z720 Tobacco use: Secondary | ICD-10-CM | POA: Diagnosis not present

## 2020-02-13 DIAGNOSIS — I2510 Atherosclerotic heart disease of native coronary artery without angina pectoris: Secondary | ICD-10-CM | POA: Diagnosis not present

## 2020-02-13 DIAGNOSIS — Z0001 Encounter for general adult medical examination with abnormal findings: Secondary | ICD-10-CM | POA: Diagnosis not present

## 2020-02-25 ENCOUNTER — Other Ambulatory Visit: Payer: Self-pay | Admitting: Cardiovascular Disease

## 2020-02-25 DIAGNOSIS — I2510 Atherosclerotic heart disease of native coronary artery without angina pectoris: Secondary | ICD-10-CM | POA: Diagnosis not present

## 2020-02-25 DIAGNOSIS — K047 Periapical abscess without sinus: Secondary | ICD-10-CM | POA: Diagnosis not present

## 2020-02-25 DIAGNOSIS — I5022 Chronic systolic (congestive) heart failure: Secondary | ICD-10-CM | POA: Diagnosis not present

## 2020-02-25 DIAGNOSIS — I10 Essential (primary) hypertension: Secondary | ICD-10-CM | POA: Diagnosis not present

## 2020-02-27 ENCOUNTER — Other Ambulatory Visit: Payer: Self-pay | Admitting: Cardiovascular Disease

## 2020-03-02 ENCOUNTER — Other Ambulatory Visit: Payer: Self-pay | Admitting: Cardiovascular Disease

## 2020-03-29 ENCOUNTER — Telehealth: Payer: Self-pay | Admitting: Cardiovascular Disease

## 2020-03-29 NOTE — Telephone Encounter (Signed)
L  Shoulder down arm and hand, down L side and leg feels like it is asleep since yesterday around lunch. Was in the shower when it started.   Tried NTG w/o relief. Denies SOB, chest, confusion, no loss of strength or coordination. Smile normal, speech WNL. No new swelling.  Denies headaches.   No vital signs taken.   No other known changes.   Pt has not attempted to reach PCP. I told her it doesn't sound like typical heart symptoms but suggested she call PCP. Pt reported she lives across the street from doctors office and will report there for further assessment.   Routing to [place] as FYI.

## 2020-03-29 NOTE — Telephone Encounter (Signed)
Patient calling States that her left side is tingly - and it feels like it is asleep Has no chest pain but did take a nitroglycerin last night Transferred to [place]

## 2020-04-01 NOTE — Telephone Encounter (Signed)
Does not sound cardiac but I would check in with primary care Unable to exclude TIA/stroke potentially as it involves arm and leg If it was just arm I would think it would be a nerve issue

## 2020-04-02 NOTE — Telephone Encounter (Signed)
Left voicemail message to call back  

## 2020-04-02 NOTE — Telephone Encounter (Signed)
Wrong number, confirmed in the chart that number was dialed correctly.   I reached out to pt by mychart to call the clinic for update.

## 2020-04-06 DIAGNOSIS — Z23 Encounter for immunization: Secondary | ICD-10-CM | POA: Diagnosis not present

## 2020-04-06 DIAGNOSIS — Z7189 Other specified counseling: Secondary | ICD-10-CM | POA: Diagnosis not present

## 2020-04-16 NOTE — Telephone Encounter (Signed)
Left voicemail message.

## 2020-04-21 DIAGNOSIS — K047 Periapical abscess without sinus: Secondary | ICD-10-CM | POA: Diagnosis not present

## 2020-04-30 DIAGNOSIS — R399 Unspecified symptoms and signs involving the genitourinary system: Secondary | ICD-10-CM | POA: Diagnosis not present

## 2020-05-02 DIAGNOSIS — R300 Dysuria: Secondary | ICD-10-CM | POA: Diagnosis not present

## 2020-05-18 ENCOUNTER — Encounter: Payer: Self-pay | Admitting: Emergency Medicine

## 2020-05-18 ENCOUNTER — Other Ambulatory Visit: Payer: Self-pay

## 2020-05-18 ENCOUNTER — Emergency Department
Admission: EM | Admit: 2020-05-18 | Discharge: 2020-05-18 | Disposition: A | Discharge status: Home / Self Care | Payer: PPO | Attending: Emergency Medicine | Admitting: Emergency Medicine

## 2020-05-18 ENCOUNTER — Emergency Department: Payer: PPO

## 2020-05-18 DIAGNOSIS — Z87891 Personal history of nicotine dependence: Secondary | ICD-10-CM | POA: Diagnosis not present

## 2020-05-18 DIAGNOSIS — Z96652 Presence of left artificial knee joint: Secondary | ICD-10-CM | POA: Insufficient documentation

## 2020-05-18 DIAGNOSIS — K0889 Other specified disorders of teeth and supporting structures: Secondary | ICD-10-CM | POA: Diagnosis not present

## 2020-05-18 DIAGNOSIS — I5022 Chronic systolic (congestive) heart failure: Secondary | ICD-10-CM | POA: Diagnosis not present

## 2020-05-18 DIAGNOSIS — Z7982 Long term (current) use of aspirin: Secondary | ICD-10-CM | POA: Insufficient documentation

## 2020-05-18 DIAGNOSIS — G43909 Migraine, unspecified, not intractable, without status migrainosus: Secondary | ICD-10-CM | POA: Diagnosis not present

## 2020-05-18 DIAGNOSIS — Z79899 Other long term (current) drug therapy: Secondary | ICD-10-CM | POA: Insufficient documentation

## 2020-05-18 DIAGNOSIS — I25118 Atherosclerotic heart disease of native coronary artery with other forms of angina pectoris: Secondary | ICD-10-CM | POA: Diagnosis not present

## 2020-05-18 DIAGNOSIS — I110 Hypertensive heart disease with heart failure: Secondary | ICD-10-CM | POA: Insufficient documentation

## 2020-05-18 DIAGNOSIS — R519 Headache, unspecified: Secondary | ICD-10-CM | POA: Diagnosis not present

## 2020-05-18 MED ORDER — CLINDAMYCIN HCL 150 MG PO CAPS
ORAL_CAPSULE | ORAL | 0 refills | Status: DC
Start: 2020-05-18 — End: 2020-10-30

## 2020-05-18 MED ORDER — BUTALBITAL-APAP-CAFFEINE 50-325-40 MG PO TABS: 1.0000 | ORAL_TABLET | Freq: Four times a day (QID) | ORAL | 0 refills | Status: DC | PRN

## 2020-05-18 NOTE — ED Notes (Signed)
See triage note  Presents with right sided h/a   States she developed h/a 2 days ago  States pain is behind eye and into ear  No n/v    States taking tylenol w/o relief

## 2020-05-18 NOTE — ED Provider Notes (Signed)
[facility] Emergency Department Provider Note   ____________________________________________   First MD Initiated Contact with Patient 05/18/20 1149     (approximate)  I have reviewed the triage vital signs and the nursing notes.   HISTORY  Chief Complaint Headache   HPI [name] is a 64 y.o. female presents to the ED with complaint of right-sided headache.  Patient states headache began 2 days and is generalized on the right side behind her eye and into her ear.  Patient states she has been taking Tylenol without any relief.  She states that she gets headaches about every other month.  She denies any nausea or vomiting with this.  She also has a tooth on the right upper side that is painful.  She already has an appointment to have this removed.  She denies any photophobia, dizziness, fever or chills.  Currently she rates her pain as an 8 out of 10.      Past Medical History:  Diagnosis Date  . Anxiety   . Arthritis   . CAD (coronary artery disease)    a. 03/2013 NSTEMI/Cath: 100 RCA, EF 45%->Med Rx;  b. 12/2013 Cath/PCI: LAD 6m, 30d, D1 80, D2 60, LCX nl, RCA 50ost/p, 79m, 95d (2.5x33 Xience DES);  c. 05/2014 Lexi MV: EF 68%, no ischemia; d. stress echo 12/2015 no ischemia @ max exercise (did not achieve target)  . Carotid disease, bilateral ([place])    a. 08/2013 Carotid U/S: 1-39% bilat ICA stenosis.  . Complication of anesthesia   . CVA (cerebral vascular accident) ([place])    a. 08/2013.  Marland Kitchen CVA (cerebral vascular accident) ([place])   . Decreased libido   . Depression   . GERD (gastroesophageal reflux disease)   . History of recurrent UTIs   . Hypercholesteremia   . Hyperlipemia   . Hypertension   . Insomnia   . Ischemic cardiomyopathy    a. 03/2013 Echo: EF 30-35%, mod dil LA, mod-sev MR, mod TR;  b. 08/2013 Echo EF 60-65%, mild AI.  Marland Kitchen Menopause   . Migraines   . Myocardial infarction ([place])   . PONV (postoperative nausea and vomiting)    If  anesthsia is given slowly, pt will not throw up, if given quickly, pt will have nausea and vomiting.  . Right lower quadrant pain   . Syncope and collapse   . Tobacco abuse   . Vaginal atrophy     Patient Active Problem List   Diagnosis Date Noted  . Pelvic pain-right lower quadrant 07/14/2018  . Right lower quadrant abdominal pain 06/30/2018  . Bilateral carotid artery stenosis 03/12/2018  . Chronic systolic CHF (congestive heart failure) ([place]) 03/12/2018  . Status post total knee replacement using cement, left 10/06/2016  . History of coronary artery stent placement   . Pure hypercholesterolemia   . Recurrent UTI 02/11/2016  . Menopause 02/11/2016  . Angina pectoris ([place]) 01/07/2016  . Syncope 07/11/2015  . Orthostatic hypotension 02/19/2015  . Dehydration 02/19/2015  . Hypokalemia 02/19/2015  . Stroke ([place]) 02/18/2015  . Tobacco abuse 02/18/2015  . History of stroke 07/18/2014  . Vertigo 07/18/2014  . Bilateral wrist pain 07/18/2014  . Atypical chest pain 06/05/2014  . Coronary artery disease of native artery of native heart with stable angina pectoris ([place]) 01/19/2014  . Carotid arterial disease ([place]) 01/19/2014  . Depression 01/16/2014  . Memory loss 01/16/2014  . Right sided weakness 08/10/2013  . Essential hypertension 08/10/2013  . Mixed hyperlipidemia 08/10/2013  .  History of MI (myocardial infarction) 08/10/2013  . Stroke, acute, embolic ([place]) 41/32/4401    Past Surgical History:  Procedure Laterality Date  . CARDIAC CATHETERIZATION  01/01/2014  . CARDIAC CATHETERIZATION  03/16/2013  . CARDIAC CATHETERIZATION  12/2013  . CARDIAC CATHETERIZATION N/A 08/19/2016   Procedure: Left Heart Cath and Coronary Angiography;  Surgeon: Minna Merritts, MD;  Location: [facility];  Service: Cardiovascular;  Laterality: N/A;  . CESAREAN SECTION WITH BILATERAL TUBAL LIGATION    . CORONARY ANGIOPLASTY WITH STENT PLACEMENT  01/01/2014   95% lesion with a drug eluting  stent to the distal RCA.  Marland Kitchen ENDOMETRIAL ABLATION    . KNEE ARTHROSCOPY  11/27/2006   left knee   . OOPHORECTOMY    . TOTAL KNEE ARTHROPLASTY Left 10/06/2016   Procedure: TOTAL KNEE ARTHROPLASTY;  Surgeon: Corky Mull, MD;  Location: [facility];  Service: Orthopedics;  Laterality: Left;  . TUBAL LIGATION      Prior to Admission medications   Medication Sig Start Date End Date Taking? Authorizing Provider  acetaminophen (TYLENOL) 500 MG tablet Take 500 mg by mouth every 6 (six) hours as needed for mild pain or moderate pain.     [provider]  albuterol (VENTOLIN HFA) 108 (90 Base) MCG/ACT inhaler INHALE 2 PUFFS INTO THE LUNGS EVERY 6 HOURS AS NEEDED FOR WHEEZING OR SHORTNESS OF BREATH 02/05/20   Minna Merritts, MD  aspirin 81 MG tablet Take 81 mg by mouth daily.    [provider]  atorvastatin (LIPITOR) 80 MG tablet Take 1 tablet (80 mg total) by mouth at bedtime. 03/15/18   Minna Merritts, MD  buPROPion (WELLBUTRIN SR) 100 MG 12 hr tablet TK 1 T PO D AT 8 AM AND 1 T AT 3 PM 02/01/18   [provider]  butalbital-acetaminophen-caffeine (FIORICET) 50-325-40 MG tablet Take 1-2 tablets by mouth every 6 (six) hours as needed for headache. 05/18/20 05/18/21  Johnn Hai, PA-C  Cholecalciferol (VITAMIN D PO) Take 1 capsule by mouth daily.    [provider]  clindamycin (CLEOCIN) 150 MG capsule 2 caps tid 05/18/20   Letitia Neri L, PA-C  ezetimibe (ZETIA) 10 MG tablet Take 1 tablet (10 mg total) by mouth daily. 03/15/18   Minna Merritts, MD  ezetimibe (ZETIA) 10 MG tablet TAKE 1 TABLET(10 MG) BY MOUTH DAILY 09/19/18   Minna Merritts, MD  furosemide (LASIX) 20 MG tablet Take 1 tablet (20 mg total) by mouth daily as needed for fluid or edema. 02/05/20   Minna Merritts, MD  hydrOXYzine (ATARAX/VISTARIL) 25 MG tablet Take 25mg s twice daily as needed for anxiety 01/27/16   [provider]  metoprolol succinate (TOPROL-XL) 25 MG 24 hr tablet Take  1 tablet (25 mg total) by mouth every morning. 03/15/18   Minna Merritts, MD  nitrofurantoin, macrocrystal-monohydrate, (MACROBID) 100 MG capsule Take 100 mg by mouth at bedtime.    [provider]  nitrofurantoin, macrocrystal-monohydrate, (MACROBID) 100 MG capsule TAKE 1 CAPSULE(100 MG) BY MOUTH AT BEDTIME 09/01/18   Defrancesco, Alanda Slim, MD  nitroGLYCERIN (NITROSTAT) 0.4 MG SL tablet DISSOLVE 1 TABLET UNDER THE TONGUE EVERY 5 MINUTES AS DIRECTED AS NEEDED FOR CHEST PAIN 03/04/20   Minna Merritts, MD  pantoprazole (PROTONIX) 40 MG tablet Take 40 mg by mouth at bedtime.     [provider]  potassium chloride SA (K-DUR,KLOR-CON) 20 MEQ tablet Take 1 tablet (20 mEq total) by mouth daily as  needed. 05/27/17   Minna Merritts, MD  triamterene-hydrochlorothiazide (DYAZIDE) 37.5-25 MG capsule TAKE 1 TABLET BY MOUTH DAILY 01/10/16   [provider]  zolpidem (AMBIEN) 10 MG tablet Take 10 mg by mouth at bedtime as needed for sleep.    [provider]    Allergies Codeine sulfate and Penicillins  Family History  Problem Relation Age of Onset  . Hypertension Mother   . Stroke Mother   . Hyperlipidemia Mother   . Breast cancer Paternal Grandmother   . Colon cancer Neg Hx   . Ovarian cancer Neg Hx   . Heart disease Neg Hx   . Diabetes Neg Hx     Social History Social History   Tobacco Use  . Smoking status: Former Smoker    Packs/day: 0.25    Years: 20.00    Pack years: 5.00    Types: Cigarettes    Quit date: 04/07/2013    Years since quitting: 7.1  . Smokeless tobacco: Never Used  Vaping Use  . Vaping Use: Never used  Substance Use Topics  . Alcohol use: Yes    Comment: 1 glass of wine a month  . Drug use: No    Review of Systems Constitutional: No fever/chills Eyes: No visual changes.  No photophobia or vision disturbances. ENT: No sore throat.  Negative for nasal congestion. Cardiovascular: Denies chest pain. Respiratory: Denies  shortness of breath. Gastrointestinal: No abdominal pain.  No nausea, no vomiting.  Genitourinary: Negative for dysuria. Musculoskeletal: Negative for musculoskeletal pain. Skin: Negative for rash. Neurological: Positive for headaches, no focal weakness or numbness.   ____________________________________________   PHYSICAL EXAM:  VITAL SIGNS: ED Triage Vitals  Enc Vitals Group     BP 05/18/20 1024 (!) 159/73     Pulse Rate 05/18/20 1024 63     Resp 05/18/20 1024 18     Temp 05/18/20 1024 98.7 F (37.1 C)     Temp Source 05/18/20 1024 Oral     SpO2 05/18/20 1024 98 %     Weight 05/18/20 1027 140 lb (63.5 kg)     Height 05/18/20 1027 5\' 2"  (1.575 m)     Head Circumference --      Peak Flow --      Pain Score 05/18/20 1027 8     Pain Loc --      Pain Edu? --      Excl. in [place]? --     Constitutional: Alert and oriented. Well appearing and in no acute distress. Eyes: Conjunctivae are normal. PERRL. EOMI. Head: Atraumatic. Nose: No congestion/rhinnorhea. Mouth/Throat: Mucous membranes are moist.  Oropharynx non-erythematous.  Right upper premolar is broken off at the edge of the gum with the gum being moderately edematous.  No active drainage was noted. Neck: No stridor.  Nontender cervical spine to palpation posteriorly.  Range of motion is that restriction. Hematological/Lymphatic/Immunilogical: No cervical lymphadenopathy. Cardiovascular: Normal rate, regular rhythm. Grossly normal heart sounds.  Good peripheral circulation. Respiratory: Normal respiratory effort.  No retractions. Lungs CTAB. Musculoskeletal: Patient is able to move upper and lower extremities without any difficulty and normal gait was noted.  Good muscle strength bilaterally. Neurologic:  Normal speech and language. No gross focal neurologic deficits are appreciated.  Cranial nerves II through XII is grossly intact.  No gait instability. Skin:  Skin is warm, dry and intact. No rash noted. Psychiatric: Mood  and affect are normal. Speech and behavior are normal.  ____________________________________________   LABS (all labs ordered  are listed, but only abnormal results are displayed)  Labs Reviewed - No data to display  RADIOLOGY  Official radiology report(s): CT Head Wo Contrast  Result Date: 05/18/2020 CLINICAL DATA:  Two day history of headache EXAM: CT HEAD WITHOUT CONTRAST TECHNIQUE: Contiguous axial images were obtained from the base of the skull through the vertex without intravenous contrast. COMPARISON:  Prior CT scan of the head 02/17/2015 FINDINGS: Brain: No evidence of acute infarction, hemorrhage, hydrocephalus, extra-axial collection or mass lesion/mass effect. Vascular: No hyperdense vessel or unexpected calcification. Skull: Normal. Negative for fracture or focal lesion. Sinuses/Orbits: No acute finding. Other: None. IMPRESSION: Negative head CT. Electronically Signed   By: Jacqulynn Cadet M.D.   On: 05/18/2020 11:31    ____________________________________________   PROCEDURES  Procedure(s) performed (including Critical Care):  Procedures   ____________________________________________   INITIAL IMPRESSION / ASSESSMENT AND PLAN / ED COURSE  As part of my medical decision making, I reviewed the following data within the electronic MEDICAL RECORD NUMBER Notes from prior ED visits and [place] Controlled Substance Database  64 year old female presents to the ED with complaint of right-sided headache for the last 2 days.  Patient has a history of headaches however CT scan was done to evaluate since she states that the headache is a little different than her normal and she had a history of strokes.  CT scan was negative.  On exam patient has a right upper premolar that is broken off at the gum and gum is edematous.  Neurologically she is intact.  No photophobia, nausea or vomiting while in the ED.  Patient was reassured.  A prescription for Fioricet 1 or 2 every every 4 hours if needed  for headache and clindamycin for dental abscess.  Patient is aware that the headache medication could cause drowsiness and she should not drive.  ____________________________________________   FINAL CLINICAL IMPRESSION(S) / ED DIAGNOSES  Final diagnoses:  Migraine without status migrainosus, not intractable, unspecified migraine type  Pain, dental     ED Discharge Orders         Ordered    butalbital-acetaminophen-caffeine (FIORICET) 50-325-40 MG tablet  Every 6 hours PRN     Discontinue  Reprint     05/18/20 1215    clindamycin (CLEOCIN) 150 MG capsule     Discontinue  Reprint     05/18/20 1215           Note:  This document was prepared using Dragon voice recognition software and may include unintentional dictation errors.    Johnn Hai, PA-C 05/18/20 1258    Nena Polio, MD 05/18/20 [phone]

## 2020-05-18 NOTE — Discharge Instructions (Signed)
Follow-up with your primary care provider if any continued problems with your migraine headache.  Also keep your appointment with your dentist for removal of the tooth.  A prescription for antibiotics was sent to your pharmacy as well as medication for your headache.  Do not drive while taking the Fioricet as it could cause drowsiness.  You can take 1 or 2 tablets every 6 hours as needed for headache.  If additional pain medication is needed you can take ibuprofen but the Fioricet already has Tylenol in it.  Increase fluids.

## 2020-05-18 NOTE — ED Triage Notes (Signed)
Pt to ER with c/o headache for last 2 days.  States she gets a headache about every other month, but this one is different in that is the right side of her head, behind her eye and around to her ear.  Pt states hx of stroke that started with confusion and affected her left side.  Pt also reports a tooth that needs to be pulled on the right side.

## 2020-06-10 DIAGNOSIS — M1711 Unilateral primary osteoarthritis, right knee: Secondary | ICD-10-CM | POA: Diagnosis not present

## 2020-06-10 DIAGNOSIS — M25461 Effusion, right knee: Secondary | ICD-10-CM | POA: Diagnosis not present

## 2020-07-24 DIAGNOSIS — M1711 Unilateral primary osteoarthritis, right knee: Secondary | ICD-10-CM | POA: Diagnosis not present

## 2020-07-28 ENCOUNTER — Other Ambulatory Visit: Payer: Self-pay | Admitting: Cardiovascular Disease

## 2020-07-30 ENCOUNTER — Ambulatory Visit: Admit: 2020-07-30 | Payer: PPO | Admitting: Surgery

## 2020-07-30 SURGERY — ARTHROPLASTY, KNEE, TOTAL
Anesthesia: Choice | Site: Knee | Laterality: Right

## 2020-08-14 DIAGNOSIS — I10 Essential (primary) hypertension: Secondary | ICD-10-CM | POA: Diagnosis not present

## 2020-08-14 DIAGNOSIS — F32A Depression, unspecified: Secondary | ICD-10-CM | POA: Diagnosis not present

## 2020-08-14 DIAGNOSIS — I2510 Atherosclerotic heart disease of native coronary artery without angina pectoris: Secondary | ICD-10-CM | POA: Diagnosis not present

## 2020-08-14 DIAGNOSIS — F411 Generalized anxiety disorder: Secondary | ICD-10-CM | POA: Diagnosis not present

## 2020-08-14 DIAGNOSIS — I5022 Chronic systolic (congestive) heart failure: Secondary | ICD-10-CM | POA: Diagnosis not present

## 2020-08-14 DIAGNOSIS — Z8673 Personal history of transient ischemic attack (TIA), and cerebral infarction without residual deficits: Secondary | ICD-10-CM | POA: Diagnosis not present

## 2020-08-14 DIAGNOSIS — E7800 Pure hypercholesterolemia, unspecified: Secondary | ICD-10-CM | POA: Diagnosis not present

## 2020-08-14 DIAGNOSIS — Z720 Tobacco use: Secondary | ICD-10-CM | POA: Diagnosis not present

## 2020-10-23 ENCOUNTER — Other Ambulatory Visit: Payer: Self-pay | Admitting: Surgery

## 2020-11-04 ENCOUNTER — Encounter: Payer: Self-pay | Admitting: Surgery

## 2020-11-04 ENCOUNTER — Telehealth: Payer: Self-pay | Admitting: *Deleted

## 2020-11-04 ENCOUNTER — Other Ambulatory Visit: Payer: Self-pay

## 2020-11-04 ENCOUNTER — Encounter
Admission: RE | Admit: 2020-11-04 | Discharge: 2020-11-04 | Disposition: A | Discharge status: Home / Self Care | Payer: PPO | Source: Ambulatory Visit | Attending: Surgery | Admitting: Surgery

## 2020-11-04 DIAGNOSIS — Z01818 Encounter for other preprocedural examination: Secondary | ICD-10-CM | POA: Diagnosis not present

## 2020-11-04 DIAGNOSIS — M25461 Effusion, right knee: Secondary | ICD-10-CM | POA: Diagnosis not present

## 2020-11-04 DIAGNOSIS — Z01810 Encounter for preprocedural cardiovascular examination: Secondary | ICD-10-CM | POA: Diagnosis not present

## 2020-11-04 DIAGNOSIS — M1711 Unilateral primary osteoarthritis, right knee: Secondary | ICD-10-CM | POA: Insufficient documentation

## 2020-11-04 HISTORY — DX: Chronic kidney disease, unspecified: N18.9

## 2020-11-04 LAB — COMPREHENSIVE METABOLIC PANEL
ALT: 22 U/L (ref 0–44)
AST: 20 U/L (ref 15–41)
Albumin: 3.8 g/dL (ref 3.5–5.0)
Alkaline Phosphatase: 89 U/L (ref 38–126)
Anion gap: 8 (ref 5–15)
BUN: 18 mg/dL (ref 8–23)
CO2: 23 mmol/L (ref 22–32)
Calcium: 9.3 mg/dL (ref 8.9–10.3)
Chloride: 104 mmol/L (ref 98–111)
Creatinine, Ser: 0.79 mg/dL (ref 0.44–1.00)
GFR, Estimated: >60 mL/min (ref >60)
Glucose, Bld: 110 mg/dL — ABNORMAL HIGH (ref 70–99)
Potassium: 3.6 mmol/L (ref 3.5–5.1)
Sodium: 135 mmol/L (ref 135–145)
Total Bilirubin: 0.6 mg/dL (ref 0.3–1.2)
Total Protein: 7.4 g/dL (ref 6.5–8.1)

## 2020-11-04 LAB — CBC WITH DIFFERENTIAL/PLATELET
Abs Immature Granulocytes: 0.03 10*3/uL (ref 0.00–0.07)
Basophils Absolute: 0.1 10*3/uL (ref 0.0–0.1)
Basophils Relative: 1 %
Eosinophils Absolute: 0.4 10*3/uL (ref 0.0–0.5)
Eosinophils Relative: 5 %
HCT: 39 % (ref 36.0–46.0)
Hemoglobin: 12.9 g/dL (ref 12.0–15.0)
Immature Granulocytes: 0 %
Lymphocytes Relative: 19 %
Lymphs Abs: 1.4 10*3/uL (ref 0.7–4.0)
MCH: 31.2 pg (ref 26.0–34.0)
MCHC: 33.1 g/dL (ref 30.0–36.0)
MCV: 94.2 fL (ref 80.0–100.0)
Monocytes Absolute: 0.7 10*3/uL (ref 0.1–1.0)
Monocytes Relative: 10 %
Neutro Abs: 4.7 10*3/uL (ref 1.7–7.7)
Neutrophils Relative %: 65 %
Platelets: 286 10*3/uL (ref 150–400)
RBC: 4.14 MIL/uL (ref 3.87–5.11)
RDW: 12.6 % (ref 11.5–15.5)
WBC: 7.2 10*3/uL (ref 4.0–10.5)
nRBC: 0 % (ref 0.0–0.2)

## 2020-11-04 LAB — URINALYSIS, ROUTINE W REFLEX MICROSCOPIC
Bilirubin Urine: NEGATIVE
Glucose, UA: NEGATIVE mg/dL
Hgb urine dipstick: NEGATIVE
Ketones, ur: NEGATIVE mg/dL
Leukocytes,Ua: NEGATIVE
Nitrite: NEGATIVE
Protein, ur: NEGATIVE mg/dL
Specific Gravity, Urine: 1.025 (ref 1.005–1.030)
pH: 5 (ref 5.0–8.0)

## 2020-11-04 LAB — SURGICAL PCR SCREEN
MRSA, PCR: NEGATIVE
Staphylococcus aureus: NEGATIVE

## 2020-11-04 LAB — TYPE AND SCREEN
ABO/RH(D): A NEG
Antibody Screen: NEGATIVE

## 2020-11-04 NOTE — Progress Notes (Signed)
[facility] Perioperative Services  Pre-Admission/Anesthesia Testing Clinical Review  Date: 11/05/20  Patient Demographics:  Name: [name] DOB:   1956-06-09 MRN:   [id number]  Planned Surgical Procedure(s):    Case: [id number] Date/Time: 11/12/20 1039   Procedure: TOTAL KNEE ARTHROPLASTY (Right Knee)   Anesthesia type: Choice   Pre-op diagnosis:      Primary osteoarthritis of right knee M17.11     Effusion of right knee M25.461   Location: [facility] OR ROOM 03 / [facility]S FOR [facility]   Surgeons: Christena Flake, MD    NOTE: Available PAT nursing documentation and vital signs have been reviewed. Clinical nursing staff has updated patient's PMH/PSHx, current medication list, and drug allergies/intolerances to ensure comprehensive history available to assist in medical decision making as it pertains to the aforementioned surgical procedure and anticipated anesthetic course.   Clinical Discussion:  [name] is a 64 y.o. female who is submitted for pre-surgical anesthesia review and clearance prior to her undergoing the above procedure. Patient is a Former Smoker (5 pack years; quit 03/2013). Pertinent PMH includes: angina, CAD (s/p PCI), ischemic cardiomyopathy, NSTEMI (03/2013), CVA (08/2013), bilateral carotid bruit, HTN, HLD, GERD (on daily PPI), OA, anxiety, depression.  Patient is followed by cardiology Mariah Milling, MD). She was last seen in the cardiology clinic on 02/05/2020; notes reviewed.  At the time of her clinic visit, patient reported intermittent episodes of shortness of breath at rest and tachycardia to 120.  He denied any episodes of chest pain.  Past medical history for NSTEMI in 03/2013. Significant cardiovascular history as follows:   Diagnostic left heart catheterization revealed 2 vessel CAD. The decision was made to treat medically.    TTE on 03/16/2013 revealed moderately reduced left ventricular systolic function with an EF of 30-35%, as  well as moderate to severe valvular insufficiency.   Suffered a mild CVA in 08/2013.  CT imaging of the head revealed a small foci of acute infarct involving the posterior limb internal capsule and posterior insula on the right.   Admitted to hospital on 12/31/2013. Repeat diagnostic left heart catheterization on 01/01/2014 that revealed progressive CAD; DES x 1 was placed to 95% lesion in the distal RCA resulting in TIMI 3 flow there were no regional wall motion abnormality.  Patient on daily DAPT therapy following stent placement.   Repeat TTE performed on 02/18/2017 revealed normal left ventricular systolic function; LVEF 55-60%. There was G1DD and mild aortic valve insufficiency.   Stress echocardiogram performed on 01/30/2016 revealed no stress-induced ischemia or conduction abnormality.  Left ventricular systolic function was normal; EF 55% at rest and 55-70% with breath.   In 08/2016, patient with persistent anginal symptoms by medical management.  Patient likely symptoms due to those.  Prior to her MI in 2014.  Repeat cardiac catheterization revealed stable mild to moderate LAD disease, moderate to severe ostial to proximal diagonal disease, patent RCA disease, mild to moderate ostial RCA disease, and mild proximal RCA disease; LVEF normal at 50-55% (see full interpretation of all cardiovascular testing and interventions below).  Since her last visit to the clinic, patient reports rare use of her prescribed sublingual nitroglycerin.  She has no functional exercise program, however functional capacity, as defined by DASI, is documented as being >/= 4 METS.  Activity level affected by chronic orthopedic pain.  Hypertension well controlled on currently prescribed diuretic therapy.  Patient is on a statin for her HLD.  She continues on daily low-dose ASA  therapy for risk factor modification.  ECG performed in the office revealed sinus rhythm at a rate of 84 bpm with precordial T wave abnormality;  grossly unchanged from previous.  No changes were made to patient's medication regimen.  PAD was noted to be doing well overall from a cardiovascular standpoint.  Follow-up with outpatient cardiology in 1 year or sooner if needed.  Patient scheduled to undergo an elective orthopedic procedure on 11/12/2020 with Dr. Roland Rack.  Given patient's past medical history significant for cardiovascular disease and intervention, presurgical cardiac clearance followed by PAT team.  Per cardiology, "based on ACC/AHA guidelines, this patient is optimized for surgery from a cardiovascular perspective and may proceed as planned at an overall ACCEPTABLE risk".  Again, this patient is on daily antiplatelet therapy.  Due to patient history of stent placement, cardiology recommending that patient continue her daily ASA throughout the perioperative period, with plans to restart as there is possible postoperatively.  Cardiology notes that if orthopedic surgeon uncomfortable with continuing ASA, it is permissible to hold this medication for up to 3 days prior to procedure.  She reports previous perioperative complications with anesthesia.  Patient has a (+) for PONV. She underwent a neuraxial anesthetic course here (ASA III) in 09/2016 with no documented complications.   Vitals with BMI 11/04/2020 05/18/2020 02/05/2020  Height 5\' 2"  5\' 2"  5\' 2"   Weight 144 lbs 11 oz 140 lbs 143 lbs 2 oz  BMI 26.46 0000000 [id number]  Systolic 0000000 [id number]  Diastolic 91 73 80  Pulse 75 63 84    Providers/Specialists:   NOTE: Primary physician provider listed below. Patient may have been seen by APP or partner within same practice.   PROVIDER ROLE / SPECIALTY LAST OV  Poggi, Marshall Cork, MD Orthopedics (Surgeon)  07/24/2020  Baxter Hire, MD Primary Care Provider  08/14/2020  Ida Rogue, MD Cardiology  02/05/2020   Allergies:  Codeine sulfate and Penicillins  Current Home Medications:   No current facility-administered medications for  this encounter.   Marland Kitchen acetaminophen (TYLENOL) 500 MG tablet  . albuterol (VENTOLIN HFA) 108 (90 Base) MCG/ACT inhaler  . aspirin EC 81 MG tablet  . buPROPion (WELLBUTRIN SR) 150 MG 12 hr tablet  . butalbital-acetaminophen-caffeine (FIORICET) 50-325-40 MG tablet  . FLUoxetine (PROZAC) 40 MG capsule  . furosemide (LASIX) 20 MG tablet  . hydrOXYzine (ATARAX/VISTARIL) 25 MG tablet  . nitrofurantoin, macrocrystal-monohydrate, (MACROBID) 100 MG capsule  . nitroGLYCERIN (NITROSTAT) 0.4 MG SL tablet  . pantoprazole (PROTONIX) 40 MG tablet  . rosuvastatin (CRESTOR) 20 MG tablet  . triamterene-hydrochlorothiazide (DYAZIDE) 37.5-25 MG capsule  . zolpidem (AMBIEN) 10 MG tablet  . Ascorbic Acid (VITAMIN C PO)  . Cholecalciferol (VITAMIN D PO)   History:   Past Medical History:  Diagnosis Date  . Anxiety   . Arthritis   . CAD (coronary artery disease)    a. 03/2013 NSTEMI/Cath: 100 RCA, EF 45%->Med Rx;  b. 12/2013 Cath/PCI: LAD 33m, 30d, D1 80, D2 60, LCX nl, RCA 50ost/p, 31m, 95d (2.5x33 Xience DES);  c. 05/2014 Lexi MV: EF 68%, no ischemia; d. stress echo 12/2015 no ischemia @ max exercise (did not achieve target)  . Carotid disease, bilateral ([place])    a. 08/2013 Carotid U/S: 1-39% bilat ICA stenosis.  . Chronic kidney disease    Chronic kidney infections. Takes daily preventative.  . Complication of anesthesia   . CVA (cerebral vascular accident) ([place])    a. 08/2013.  Marland Kitchen CVA (cerebral vascular accident) ([place])   .  Decreased libido   . Depression   . GERD (gastroesophageal reflux disease)   . History of 2019 novel coronavirus disease (COVID-19) 07/2019  . History of recurrent UTIs   . Hypercholesteremia   . Hyperlipemia   . Hypertension   . Insomnia   . Ischemic cardiomyopathy    a. 03/2013 Echo: EF 30-35%, mod dil LA, mod-sev MR, mod TR;  b. 08/2013 Echo EF 60-65%, mild AI.  Marland Kitchen Menopause   . Migraines   . Myocardial infarction (HCC)   . PONV (postoperative nausea and vomiting)    If  anesthsia is given slowly, pt will not throw up, if given quickly, pt will have nausea and vomiting.  . Right lower quadrant pain   . Syncope and collapse   . Tobacco abuse   . Vaginal atrophy    Past Surgical History:  Procedure Laterality Date  . CARDIAC CATHETERIZATION  01/01/2014  . CARDIAC CATHETERIZATION  03/16/2013  . CARDIAC CATHETERIZATION  12/2013  . CARDIAC CATHETERIZATION N/A 08/19/2016   Procedure: Left Heart Cath and Coronary Angiography;  Surgeon: Antonieta Iba, MD;  Location: [facility] INVASIVE CV LAB;  Service: Cardiovascular;  Laterality: N/A;  . CESAREAN SECTION WITH BILATERAL TUBAL LIGATION    . CORONARY ANGIOPLASTY WITH STENT PLACEMENT  01/01/2014   95% lesion with a drug eluting stent to the distal RCA.  Marland Kitchen ENDOMETRIAL ABLATION    . KNEE ARTHROSCOPY  11/27/2006   left knee   . OOPHORECTOMY    . TOTAL KNEE ARTHROPLASTY Left 10/06/2016   Procedure: TOTAL KNEE ARTHROPLASTY;  Surgeon: Christena Flake, MD;  Location: [facility] ORS;  Service: Orthopedics;  Laterality: Left;  . TUBAL LIGATION     Family History  Problem Relation Age of Onset  . Hypertension Mother   . Stroke Mother   . Hyperlipidemia Mother   . Breast cancer Paternal Grandmother   . Colon cancer Neg Hx   . Ovarian cancer Neg Hx   . Heart disease Neg Hx   . Diabetes Neg Hx    Social History   Tobacco Use  . Smoking status: Former Smoker    Packs/day: 0.25    Years: 20.00    Pack years: 5.00    Types: Cigarettes    Quit date: 04/07/2013    Years since quitting: 7.5  . Smokeless tobacco: Never Used  Vaping Use  . Vaping Use: Never used  Substance Use Topics  . Alcohol use: Yes    Comment: 1 glass of wine a month  . Drug use: No    Pertinent Clinical Results:  LABS: Labs reviewed: Acceptable for surgery.  Hospital Outpatient Visit on 11/04/2020  Component Date Value Ref Range Status  . WBC 11/04/2020 7.2  4.0 - 10.5 K/uL Final  . RBC 11/04/2020 4.14  3.87 - 5.11 MIL/uL Final  . Hemoglobin  11/04/2020 12.9  12.0 - 15.0 g/dL Final  . HCT 97/98/9211 39.0  36.0 - 46.0 % Final  . MCV 11/04/2020 94.2  80.0 - 100.0 fL Final  . MCH 11/04/2020 31.2  26.0 - 34.0 pg Final  . MCHC 11/04/2020 33.1  30.0 - 36.0 g/dL Final  . RDW 94/17/4081 12.6  11.5 - 15.5 % Final  . Platelets 11/04/2020 286  150 - 400 K/uL Final  . nRBC 11/04/2020 0.0  0.0 - 0.2 % Final  . Neutrophils Relative % 11/04/2020 65  % Final  . Neutro Abs 11/04/2020 4.7  1.7 - 7.7 K/uL Final  . Lymphocytes Relative 11/04/2020  19  % Final  . Lymphs Abs 11/04/2020 1.4  0.7 - 4.0 K/uL Final  . Monocytes Relative 11/04/2020 10  % Final  . Monocytes Absolute 11/04/2020 0.7  0.1 - 1.0 K/uL Final  . Eosinophils Relative 11/04/2020 5  % Final  . Eosinophils Absolute 11/04/2020 0.4  0.0 - 0.5 K/uL Final  . Basophils Relative 11/04/2020 1  % Final  . Basophils Absolute 11/04/2020 0.1  0.0 - 0.1 K/uL Final  . Immature Granulocytes 11/04/2020 0  % Final  . Abs Immature Granulocytes 11/04/2020 0.03  0.00 - 0.07 K/uL Final   Performed at [facility], [address] [place], [place] [address]  . Sodium 11/04/2020 135  135 - 145 mmol/L Final  . Potassium 11/04/2020 3.6  3.5 - 5.1 mmol/L Final  . Chloride 11/04/2020 104  98 - 111 mmol/L Final  . CO2 11/04/2020 23  22 - 32 mmol/L Final  . Glucose, Bld 11/04/2020 110* 70 - 99 mg/dL Final   Glucose reference range applies only to samples taken after fasting for at least 8 hours.  . BUN 11/04/2020 18  8 - 23 mg/dL Final  . Creatinine, Ser 11/04/2020 0.79  0.44 - 1.00 mg/dL Final  . Calcium 11/04/2020 9.3  8.9 - 10.3 mg/dL Final  . Total Protein 11/04/2020 7.4  6.5 - 8.1 g/dL Final  . Albumin 11/04/2020 3.8  3.5 - 5.0 g/dL Final  . AST 11/04/2020 20  15 - 41 U/L Final  . ALT 11/04/2020 22  0 - 44 U/L Final  . Alkaline Phosphatase 11/04/2020 89  38 - 126 U/L Final  . Total Bilirubin 11/04/2020 0.6  0.3 - 1.2 mg/dL Final  . GFR, Estimated 11/04/2020 >60  >60 mL/min Final    Comment: (NOTE) Calculated using the CKD-EPI Creatinine Equation (2021)   . Anion gap 11/04/2020 8  5 - 15 Final   Performed at [facility], [address] [place], [place] [address]  . Color, Urine 11/04/2020 YELLOW* YELLOW Final  . APPearance 11/04/2020 HAZY* CLEAR Final  . Specific Gravity, Urine 11/04/2020 1.025  1.005 - 1.030 Final  . pH 11/04/2020 5.0  5.0 - 8.0 Final  . Glucose, UA 11/04/2020 NEGATIVE  NEGATIVE mg/dL Final  . Hgb urine dipstick 11/04/2020 NEGATIVE  NEGATIVE Final  . Bilirubin Urine 11/04/2020 NEGATIVE  NEGATIVE Final  . Ketones, ur 11/04/2020 NEGATIVE  NEGATIVE mg/dL Final  . Protein, ur 11/04/2020 NEGATIVE  NEGATIVE mg/dL Final  . Nitrite 11/04/2020 NEGATIVE  NEGATIVE Final  . Chalmers Guest 11/04/2020 NEGATIVE  NEGATIVE Final   Performed at [facility], [address] [place], [place] [address]  . ABO/RH(D) 11/04/2020 A NEG   Final  . Antibody Screen 11/04/2020 NEG   Final  . Sample Expiration 11/04/2020 11/18/2020,2359   Final  . Extend sample reason 11/04/2020    Final                   Value:NO TRANSFUSIONS OR PREGNANCY IN THE PAST 3 MONTHS Performed at [facility], [address] [place],  [address]   . MRSA, PCR 11/04/2020 NEGATIVE  NEGATIVE Final  . Staphylococcus aureus 11/04/2020 NEGATIVE  NEGATIVE Final   Comment: (NOTE) The Xpert SA Assay (FDA approved for NASAL specimens in patients 70 years of age and older), is one component of a comprehensive surveillance program. It is not intended to diagnose infection nor to guide or monitor treatment. Performed at [facility], [address],  [place], [place] [address]     ECG: Date: 11/05/2020 Time ECG obtained: 1012 AM Rate: 68 bpm Rhythm: normal sinus Axis (leads I and aVF): Normal Intervals: PR 174 ms. QRS 90 ms. QTc 416 ms. ST segment and T wave changes: Nonspecific T wave abnormality in leads V4-V6; non acute Comparison: Similar to  previous tracing obtained on 02/05/2020   IMAGING / PROCEDURES: LEFT HEART CATHETERIZATION AND CORONARY ANGIOGRAPHY performed on 08/19/2016 1. LVEF 55-60% 2. Stable mild to moderate LAD disease  30% proximal LAD disease  40% mid LAD disease 3. Moderate to severe ostial and proximal diagonal disease  70% ostial and proximal disease of the D2 and D3 vessel 4. Patent RCA disease  10% percent mid to distal RCA lesion  45% ostial RCA lesion  30% mid RCA lesion 5. Mild to moderate ostial RCA and proximal RCA disease       STRESS ECHOCARDIOGRAM performed on 01/30/2016 1. There were no stress arrhythmias or conduction abnormalities.  2. The stress ECG was normal, although target heart rate was not achieved.  3. Left ventricular ejection fraction was normal at rest and with stress. EF 55% at rest, improving to 65 to 70% with stress.  4. Normal echo stress  Echocardiogram performed on 02/19/2015 1. LVEF 55-60 percent 2. Left ventricular cavity size normal 3. Wall thickness was normal 4. Left ventricular systolic function was normal 5. No wall motion abnormalities noted 6. Mild aortic valve regurgitation 7. Calcified mitral valve annular 8. Increased atrial septal thickness consistent with lipomatous hypertrophy 9. Grade 1 diastolic dysfunction  Impression and Plan:  SHAKIYLA WOODROME has been referred for pre-anesthesia review and clearance prior to her undergoing the planned anesthetic and procedural courses. Available labs, pertinent testing, and imaging results were personally reviewed by me. This patient has been appropriately cleared by cardiology.   Based on clinical review performed today (11/05/20), barring any significant acute changes in the patient's overall condition, it is anticipated that she will be able to proceed with the planned surgical intervention. Any acute changes in clinical condition may necessitate her procedure being postponed and/or cancelled. Pre-surgical  instructions were reviewed with the patient during her PAT appointment and questions were fielded by PAT clinical staff.  Honor Loh, MSN, APRN, FNP-C, CEN [facility]  Peri-operative Services Nurse Practitioner Phone: [phone] 11/05/20 4:08 PM  NOTE: This note has been prepared using Dragon dictation software. Despite my best ability to proofread, there is always the potential that unintentional transcriptional errors may still occur from this process.

## 2020-11-04 NOTE — Telephone Encounter (Signed)
PMH CAD (03/2014 DES RCA), CVA, COVID 07/2019, HLD, HTN.   Left VM with patient requesting call back.   Will route to Dr. Rockey Situ regarding recommendations for holding Aspirin prior to right TKA.   Loel Dubonnet, NP

## 2020-11-04 NOTE — Progress Notes (Signed)
[facility] Perioperative Services: Pre-Admission/Anesthesia Testing   Date: 11/04/20 Name: [name] MRN:   [id number]  Re: Consideration of preoperative prophylactic antibiotic change   Request sent to: Poggi, Excell Seltzer, MD (routed and/or faxed via [facility])  Planned Surgical Procedure(s):    Case: [id number] Date/Time: 11/12/20 1039   Procedure: TOTAL KNEE ARTHROPLASTY (Right Knee)   Anesthesia type: Choice   Pre-op diagnosis:      Primary osteoarthritis of right knee M17.11     Effusion of right knee M25.461   Location: [facility] OR ROOM 03 / [facility]   Surgeons: Christena Flake, MD    Notes: 1. Patient has a documented allergy to PCN  . Advising that PCN has caused her to experience N/V in the past.   2. Received cephalosporin with no documented complications . CEFTRIAXONE received on 12/27/2017  3. Screened as appropriate for cephalosporin use during medication reconciliation . No immediate angioedema, dysphagia, SOB, anaphylaxis symptoms. . No severe rash involving mucous membranes or skin necrosis. . No hospital admissions related to side effects of PCN/cephalosporin use.  . No documented reaction to PCN or cephalosporin in the last 10 years.  Request:  As an evidence based approach to reducing the rate of incidence for post-operative SSI and the development of MDROs, could an agent with narrower coverage for preoperative prophylaxis in this patient's upcoming surgical course be considered?  1. Currently ordered preoperative prophylactic ABX: clindamycin.   2. Specifically requesting change to cephalosporin (CEFAZOLIN).   3. Please communicate decision with me and I will change the orders in Epic as per your direction.   Things to consider:  Many patients report that they were "allergic" to PCN earlier in life, however this does not translate into a true lifelong allergy. Patients can lose sensitivity to specific IgE antibodies over  time if PCN is avoided (Kleris & Lugar, 2019).   Up to 10% of the adult population and 15% of hospitalized patients report an allergy to PCN, however clinical studies suggest that 90% of those reporting an allergy can tolerate PCN antibiotics (Kleris & Lugar, 2019).   Cross-sensitivity between PCN and cephalosporins has been documented as being as high as 10%, however this estimation included data believed to have been collected in a setting where there was contamination. Newer data suggests that the prevalence of cross-sensitivity between PCN and cephalosporins is actually estimated to be closer to 1% (Hermanides et al., 2018).    Patients labeled as PCN allergic, whether they are truly allergic or not, have been found to have inferior outcomes in terms of rates of serious infection, and these patients tend to have longer hospital stays [facility] & Lugar, 2019).   Treatment related secondary infections, such as Clostridioides difficile, have been linked to the improper use of broad spectrum antibiotics in patients improperly labeled as PCN allergic (Kleris & Lugar, 2019).   Anaphylaxis from cephalosporins is rare and the evidence suggests that there is no increased risk of an anaphylactic type reaction when cephalosporins are used in a PCN allergic patient (Pichichero, 2006).  Citations: Hermanides J, Lemkes BA, Prins Gwenyth Bender MW, Terreehorst I. Presumed ?-Lactam Allergy and Cross-reactivity in the Operating Theater: A Practical Approach. Anesthesiology. 2018 Aug;129(2):335-342. doi: 10.1097/ALN.0000000000002252. PMID: 71245809.  Kleris, R. S., & Lugar, P. L. (2019). Things We Do For No Reason: Failing to Question a Penicillin Allergy History. Journal of hospital medicine, 14(10), [phone]. Advance online publication. airportbarriers.com  Pichichero, M. E. (2006). Cephalosporins can  be prescribed safely for penicillin-allergic patients. Journal of family medicine, 55(2), 106-112.  Accessed: https://cdn.mdedge.com/files/s62fs-public/Document/September-2017/5502JFP_AppliedEvidence1.pdf   Quentin Mulling, MSN, APRN, FNP-C, CEN [facility]  Peri-operative Services Nurse Practitioner FAX: [phone] 11/04/20 10:30 AM

## 2020-11-04 NOTE — Telephone Encounter (Signed)
Spoke with patient. She reports doing well from cardiac perspective. Denies chest pain, pressure, tightness, shortness of breath, dyspnea on exertion. We scheduled her 1 year follow up for March 2022.   Await recommendations regarding Aspirin by Dr. Mariah Milling. She requested a call back with these recommendations as she does not use her MyChart routinely.    Alver Sorrow, NP

## 2020-11-04 NOTE — Telephone Encounter (Signed)
Request for pre-operative cardiac clearance Received: Today Karen Kitchens, NP  P [facility] Request for pre-operative cardiac clearance:    1. What type of surgery is being performed?  RIGHT total knee arthroplasty   2. When is this surgery scheduled?  11/12/2020    3. Are there any medications that need to be held prior to surgery?  ASA   4. Practice name and name of physician performing surgery?  Performing surgeon: Dr. Jenny Reichmann Poggi  Requesting clearance: Honor Loh, FNP-C     5. Anesthesia type (none, local, MAC, general)? General   6. What is the office phone and fax number?   Phone: [phone]  Fax: [phone]   ATTENTION: Unable to create telephone message as per your standard workflow. Directed by [facility] providers to send requests for cardiac clearance to this pool for appropriate distribution to provider covering pre-operative clearances.   Honor Loh, MSN, APRN, FNP-C, CEN  [facility]  Peri-operative Services Nurse Practitioner  Phone: [phone]  11/04/20 10:36 AM

## 2020-11-04 NOTE — Patient Instructions (Signed)
Your procedure is scheduled on: Tuesday 11/12/20.  Report to THE FIRST FLOOR REGISTRATION DESK IN THE MEDICAL MALL ON THE MORNING OF SURGERY FIRST, THEN YOU WILL CHECK IN AT THE SURGERY INFORMATION DESK LOCATED OUTSIDE THE SAME DAY SURGERY DEPARTMENT LOCATED ON 2ND FLOOR MEDICAL MALL ENTRANCE.  To find out your arrival time please call [phone] between 1PM - 3PM on Monday 11/11/20.   Remember: Instructions that are not followed completely may result in serious medical risk, up to and including death, or upon the discretion of your surgeon and anesthesiologist your surgery may need to be rescheduled.     __X__ 1. Do not eat food after midnight the night before your procedure.                 No gum chewing or hard candies. You may drink clear liquids up to 2 hours                 before you are scheduled to arrive for your surgery- DO NOT drink clear                 liquids within 2 hours of the start of your surgery.                 Clear Liquids include:  water, apple juice without pulp, clear carbohydrate                 drink such as Clearfast or Gatorade, Black Coffee or Tea (Do not add                 milk or creamer to coffee or tea).  ** Dr. Joice Lofts would like for you to finish your Pre-Surgery Ensure on the morning of surgery 2 hours prior to your arrival time. **  __X__2.  On the morning of surgery brush your teeth with toothpaste and water, you may rinse your mouth with mouthwash if you wish.  Do not swallow any toothpaste or mouthwash.    __X__ 3.  No Alcohol for 24 hours before or after surgery.  __X__ 4.  Do Not Smoke or use e-cigarettes For 24 Hours Prior to Your Surgery.                 Do not use any chewable tobacco products for at least 6 hours prior to                 surgery.  __X__5.  Notify your doctor if there is any change in your medical condition      (cold, fever, infections).      Do NOT wear jewelry, make-up, hairpins, clips or nail polish. Do NOT wear  lotions, powders, or perfumes.  Do NOT shave 48 hours prior to surgery. Men may shave face and neck. Do NOT bring valuables to the hospital.     [facility] is not responsible for any belongings or valuables.   Contacts, dentures/partials or body piercings may not be worn into surgery. Bring a case for your contacts, glasses or hearing aids, a denture cup will be supplied.  Leave your suitcase in the car. After surgery it may be brought to your room.   For patients admitted to the hospital, discharge time is determined by your treatment team.     __X__ Take these medicines the morning of surgery with A SIP OF WATER:     1. albuterol (VENTOLIN HFA)   2. buPROPion [facility] SR)   3.  FLUoxetine (PROZAC)  4. nitrofurantoin, macrocrystal-monohydrate, (MACROBID)  5. pantoprazole (PROTONIX)    __X__ Use CHG Soap as directed  __X__ Use inhalers on the day of surgery. Also bring the inhaler with you to the hospital on the morning of surgery.    __X__ Stop Blood Thinners: Aspirin according to the direction of Dr. Letitia Libra.  __X__ Stop Anti-inflammatories 7 days before surgery such as Advil, Ibuprofen, Motrin, BC or Goodies Powder, Naprosyn, Naproxen, Aleve, Aspirin, Meloxicam. May take Tylenol if needed for pain or discomfort.   __X__Do not start taking any new herbal supplements or vitamins prior to your procedure.  __X__ Stop the following herbal supplements or vitamins:  Ascorbic Acid (VITAMIN C PO)    Wear comfortable clothing (specific to your surgery type) to the hospital.  Plan for stool softeners for home use; pain medications have a tendency to cause constipation. You can also help prevent constipation by eating foods high in fiber such as fruits and vegetables and drinking plenty of fluids as your diet allows.  After surgery, you can prevent lung complications by doing breathing exercises.Take deep breaths and cough every 1-2 hours. Your doctor may order a device called  an Incentive Spirometer to help you take deep breaths.  Please call the Pre-Admissions Testing Department at [phone] if you have any questions about these instructions.

## 2020-11-05 NOTE — Telephone Encounter (Signed)
   Primary Cardiologist: Julien Nordmann, MD  Chart reviewed as part of pre-operative protocol coverage. Given past medical history and time since last visit, based on ACC/AHA guidelines, [name] would be at acceptable risk for the planned procedure without further cardiovascular testing.   Per discussion with Dr. Mariah Milling, we recommend continuing Aspirin throughout the perioperative period due to history of stent placement. If procedure cannot be completed while on Aspirin, permissible to hold 1-3 days prior to procedure. Recommend prompt resumption after procedure.   The patient was advised that if she develops new symptoms prior to surgery to contact our office to arrange for a follow-up visit, and she verbalized understanding.  I will route this recommendation to the requesting party via Epic fax function and remove from pre-op pool.  Please call with questions.  Alver Sorrow, NP 11/05/2020, 3:58 PM

## 2020-11-06 NOTE — Progress Notes (Signed)
  [facility] Perioperative Services: Pre-Admission/Anesthesia Testing     Date: 11/06/20  Name: NAVADA OSTERHOUT MRN:   [id number]  Re: Change in ABX for upcoming surgery   Case: [id number] Date/Time: 11/12/20 1039   Procedure: TOTAL KNEE ARTHROPLASTY (Right Knee)   Anesthesia type: Choice   Pre-op diagnosis:      Primary osteoarthritis of right knee M17.11     Effusion of right knee M25.461   Location: [facility] OR ROOM 03 / [facility] ORS FOR ANESTHESIA GROUP   Surgeons: Christena Flake, MD    Primary attending surgeon was consulted regarding consideration of therapeutic change in antimicrobial agent being used for preoperative prophylaxis in this patient's upcoming surgical case. Following analysis of the risk versus benefits, Dr. Joice Lofts, Excell Seltzer, MD advising that it would be acceptable to discontinue the ordered clindamycin and place an order for cefazolin 2 gm IV on call to the OR. Orders for this patient were amended by me following collaborative conversation with attending surgeon.  Quentin Mulling, MSN, APRN, FNP-C, CEN [facility]  Peri-operative Services Nurse Practitioner Phone: [phone] 11/06/20 8:25 AM

## 2020-11-11 ENCOUNTER — Other Ambulatory Visit: Payer: Self-pay

## 2020-11-11 ENCOUNTER — Other Ambulatory Visit
Admission: RE | Admit: 2020-11-11 | Discharge: 2020-11-11 | Disposition: A | Discharge status: Home / Self Care | Payer: PPO | Source: Ambulatory Visit | Attending: Surgery | Admitting: Surgery

## 2020-11-11 DIAGNOSIS — Z20822 Contact with and (suspected) exposure to covid-19: Secondary | ICD-10-CM | POA: Insufficient documentation

## 2020-11-11 DIAGNOSIS — Z01812 Encounter for preprocedural laboratory examination: Secondary | ICD-10-CM | POA: Insufficient documentation

## 2020-11-11 LAB — SARS CORONAVIRUS 2 (TAT 6-24 HRS): SARS Coronavirus 2: NEGATIVE

## 2020-11-12 ENCOUNTER — Inpatient Hospital Stay: Payer: PPO | Admitting: Urgent Care

## 2020-11-12 ENCOUNTER — Other Ambulatory Visit: Payer: Self-pay

## 2020-11-12 ENCOUNTER — Encounter
Admission: RE | Disposition: A | Discharge status: Home / Self Care | Payer: Self-pay | Source: Home / Self Care | Attending: Surgery

## 2020-11-12 ENCOUNTER — Inpatient Hospital Stay: Payer: PPO

## 2020-11-12 ENCOUNTER — Ambulatory Visit
Admission: RE | Admit: 2020-11-12 | Discharge: 2020-11-12 | Disposition: A | Discharge status: Home / Self Care | Payer: PPO | Attending: Surgery | Admitting: Surgery

## 2020-11-12 ENCOUNTER — Encounter: Payer: Self-pay | Admitting: Surgery

## 2020-11-12 DIAGNOSIS — K219 Gastro-esophageal reflux disease without esophagitis: Secondary | ICD-10-CM | POA: Diagnosis not present

## 2020-11-12 DIAGNOSIS — Z471 Aftercare following joint replacement surgery: Secondary | ICD-10-CM | POA: Diagnosis not present

## 2020-11-12 DIAGNOSIS — Z9889 Other specified postprocedural states: Secondary | ICD-10-CM | POA: Diagnosis not present

## 2020-11-12 DIAGNOSIS — R262 Difficulty in walking, not elsewhere classified: Secondary | ICD-10-CM | POA: Insufficient documentation

## 2020-11-12 DIAGNOSIS — M6281 Muscle weakness (generalized): Secondary | ICD-10-CM | POA: Diagnosis not present

## 2020-11-12 DIAGNOSIS — Z885 Allergy status to narcotic agent status: Secondary | ICD-10-CM | POA: Insufficient documentation

## 2020-11-12 DIAGNOSIS — M25461 Effusion, right knee: Secondary | ICD-10-CM | POA: Diagnosis not present

## 2020-11-12 DIAGNOSIS — Z96652 Presence of left artificial knee joint: Secondary | ICD-10-CM | POA: Diagnosis not present

## 2020-11-12 DIAGNOSIS — Z7982 Long term (current) use of aspirin: Secondary | ICD-10-CM | POA: Diagnosis not present

## 2020-11-12 DIAGNOSIS — F17200 Nicotine dependence, unspecified, uncomplicated: Secondary | ICD-10-CM | POA: Insufficient documentation

## 2020-11-12 DIAGNOSIS — Z882 Allergy status to sulfonamides status: Secondary | ICD-10-CM | POA: Insufficient documentation

## 2020-11-12 DIAGNOSIS — I2510 Atherosclerotic heart disease of native coronary artery without angina pectoris: Secondary | ICD-10-CM | POA: Diagnosis not present

## 2020-11-12 DIAGNOSIS — M1711 Unilateral primary osteoarthritis, right knee: Secondary | ICD-10-CM | POA: Diagnosis not present

## 2020-11-12 DIAGNOSIS — Z880 Allergy status to penicillin: Secondary | ICD-10-CM | POA: Insufficient documentation

## 2020-11-12 DIAGNOSIS — Z79899 Other long term (current) drug therapy: Secondary | ICD-10-CM | POA: Insufficient documentation

## 2020-11-12 DIAGNOSIS — I110 Hypertensive heart disease with heart failure: Secondary | ICD-10-CM | POA: Diagnosis not present

## 2020-11-12 DIAGNOSIS — Z96651 Presence of right artificial knee joint: Secondary | ICD-10-CM | POA: Diagnosis not present

## 2020-11-12 HISTORY — PX: TOTAL KNEE ARTHROPLASTY: SHX125

## 2020-11-12 SURGERY — ARTHROPLASTY, KNEE, TOTAL
Anesthesia: Spinal | Site: Knee | Laterality: Right

## 2020-11-12 MED ORDER — KETOROLAC TROMETHAMINE 30 MG/ML IJ SOLN
INTRAMUSCULAR | Status: AC
  Administered 2020-11-12: 30 mg via INTRAVENOUS
  Filled 2020-11-12: qty 1

## 2020-11-12 MED ORDER — CEFAZOLIN SODIUM-DEXTROSE 2-4 GM/100ML-% IV SOLN
INTRAVENOUS | Status: AC
  Filled 2020-11-12: qty 100

## 2020-11-12 MED ORDER — MIDAZOLAM HCL 2 MG/2ML IJ SOLN
INTRAMUSCULAR | Status: AC
  Filled 2020-11-12: qty 2

## 2020-11-12 MED ORDER — OXYCODONE HCL 5 MG PO TABS
ORAL_TABLET | ORAL | Status: AC
  Filled 2020-11-12: qty 1

## 2020-11-12 MED ORDER — FENTANYL CITRATE (PF) 100 MCG/2ML IJ SOLN: 25.0000 ug | INTRAMUSCULAR | Status: DC | PRN

## 2020-11-12 MED ORDER — LIDOCAINE HCL (CARDIAC) PF 100 MG/5ML IV SOSY
PREFILLED_SYRINGE | INTRAVENOUS | Status: DC | PRN
  Administered 2020-11-12: 50 mg via INTRAVENOUS

## 2020-11-12 MED ORDER — TRANEXAMIC ACID 1000 MG/10ML IV SOLN
INTRAVENOUS | Status: AC
  Filled 2020-11-12: qty 10

## 2020-11-12 MED ORDER — KETOROLAC TROMETHAMINE 15 MG/ML IJ SOLN: 15.0000 mg | Freq: Four times a day (QID) | INTRAMUSCULAR | Status: DC

## 2020-11-12 MED ORDER — CEFAZOLIN SODIUM-DEXTROSE 2-4 GM/100ML-% IV SOLN
2.0000 g | Freq: Once | INTRAVENOUS | Status: AC
  Administered 2020-11-12: 2 g via INTRAVENOUS

## 2020-11-12 MED ORDER — EPINEPHRINE PF 1 MG/ML IJ SOLN
INTRAMUSCULAR | Status: AC
  Filled 2020-11-12: qty 1

## 2020-11-12 MED ORDER — SODIUM CHLORIDE 0.9 % IV SOLN: INTRAVENOUS | Status: DC

## 2020-11-12 MED ORDER — ONDANSETRON HCL 4 MG/2ML IJ SOLN
INTRAMUSCULAR | Status: DC | PRN
  Administered 2020-11-12: 4 mg via INTRAVENOUS

## 2020-11-12 MED ORDER — OXYCODONE HCL 5 MG PO TABS
5.0000 mg | ORAL_TABLET | ORAL | Status: DC | PRN
  Administered 2020-11-12: 5 mg via ORAL

## 2020-11-12 MED ORDER — KETOROLAC TROMETHAMINE 30 MG/ML IJ SOLN: 30.0000 mg | Freq: Once | INTRAMUSCULAR | Status: AC

## 2020-11-12 MED ORDER — ACETAMINOPHEN 10 MG/ML IV SOLN
INTRAVENOUS | Status: DC | PRN
  Administered 2020-11-12: 1000 mg via INTRAVENOUS

## 2020-11-12 MED ORDER — TRANEXAMIC ACID 1000 MG/10ML IV SOLN
INTRAVENOUS | Status: DC | PRN
  Administered 2020-11-12: 1000 mg via TOPICAL

## 2020-11-12 MED ORDER — SODIUM CHLORIDE 0.9 % BOLUS PEDS
250.0000 mL | Freq: Once | INTRAVENOUS | Status: AC
  Administered 2020-11-12: 250 mL via INTRAVENOUS

## 2020-11-12 MED ORDER — SODIUM CHLORIDE FLUSH 0.9 % IV SOLN
INTRAVENOUS | Status: AC
  Filled 2020-11-12: qty 40

## 2020-11-12 MED ORDER — ONDANSETRON HCL 4 MG/2ML IJ SOLN: 4.0000 mg | Freq: Four times a day (QID) | INTRAMUSCULAR | Status: DC | PRN

## 2020-11-12 MED ORDER — METOCLOPRAMIDE HCL 10 MG PO TABS: 5.0000 mg | ORAL_TABLET | Freq: Three times a day (TID) | ORAL | Status: DC | PRN

## 2020-11-12 MED ORDER — BUPIVACAINE HCL (PF) 0.5 % IJ SOLN
INTRAMUSCULAR | Status: AC
  Filled 2020-11-12: qty 30

## 2020-11-12 MED ORDER — LACTATED RINGERS IV SOLN: INTRAVENOUS | Status: DC

## 2020-11-12 MED ORDER — CHLORHEXIDINE GLUCONATE 0.12 % MT SOLN: 15.0000 mL | Freq: Once | OROMUCOSAL | Status: AC

## 2020-11-12 MED ORDER — ORAL CARE MOUTH RINSE: 15.0000 mL | Freq: Once | OROMUCOSAL | Status: AC

## 2020-11-12 MED ORDER — PROPOFOL 500 MG/50ML IV EMUL
INTRAVENOUS | Status: AC
  Filled 2020-11-12: qty 50

## 2020-11-12 MED ORDER — ONDANSETRON HCL 4 MG/2ML IJ SOLN
INTRAMUSCULAR | Status: AC
  Filled 2020-11-12: qty 2

## 2020-11-12 MED ORDER — BUPIVACAINE HCL (PF) 0.5 % IJ SOLN
INTRAMUSCULAR | Status: AC
  Filled 2020-11-12: qty 10

## 2020-11-12 MED ORDER — BUPIVACAINE HCL (PF) 0.5 % IJ SOLN
INTRAMUSCULAR | Status: DC | PRN
  Administered 2020-11-12: 2.2 mL via INTRATHECAL

## 2020-11-12 MED ORDER — PHENYLEPHRINE HCL (PRESSORS) 10 MG/ML IV SOLN
INTRAVENOUS | Status: AC
  Filled 2020-11-12: qty 1

## 2020-11-12 MED ORDER — BUPIVACAINE LIPOSOME 1.3 % IJ SUSP
INTRAMUSCULAR | Status: AC
  Filled 2020-11-12: qty 20

## 2020-11-12 MED ORDER — LIDOCAINE HCL (PF) 2 % IJ SOLN
INTRAMUSCULAR | Status: AC
  Filled 2020-11-12: qty 5

## 2020-11-12 MED ORDER — CEFAZOLIN SODIUM-DEXTROSE 2-4 GM/100ML-% IV SOLN
2.0000 g | Freq: Four times a day (QID) | INTRAVENOUS | Status: DC
  Administered 2020-11-12: 2 g via INTRAVENOUS
  Filled 2020-11-12: qty 100

## 2020-11-12 MED ORDER — APIXABAN 2.5 MG PO TABS: 2.5000 mg | ORAL_TABLET | Freq: Two times a day (BID) | ORAL | 0 refills | Status: DC

## 2020-11-12 MED ORDER — ACETAMINOPHEN 500 MG PO TABS: 1000.0000 mg | ORAL_TABLET | Freq: Four times a day (QID) | ORAL | Status: DC

## 2020-11-12 MED ORDER — SODIUM CHLORIDE 0.9 % IV SOLN
INTRAVENOUS | Status: DC | PRN
  Administered 2020-11-12: 60 mL

## 2020-11-12 MED ORDER — BUPIVACAINE-EPINEPHRINE (PF) 0.5% -1:200000 IJ SOLN
INTRAMUSCULAR | Status: DC | PRN
  Administered 2020-11-12: 30 mL

## 2020-11-12 MED ORDER — PROPOFOL 500 MG/50ML IV EMUL
INTRAVENOUS | Status: DC | PRN
  Administered 2020-11-12: 100 ug/kg/min via INTRAVENOUS

## 2020-11-12 MED ORDER — OXYCODONE HCL 5 MG PO TABS: 5.0000 mg | ORAL_TABLET | ORAL | 0 refills | Status: DC | PRN

## 2020-11-12 MED ORDER — FAMOTIDINE 20 MG PO TABS
ORAL_TABLET | ORAL | Status: AC
  Administered 2020-11-12: 20 mg
  Filled 2020-11-12: qty 1

## 2020-11-12 MED ORDER — PROPOFOL 10 MG/ML IV BOLUS
INTRAVENOUS | Status: AC
  Filled 2020-11-12: qty 80

## 2020-11-12 MED ORDER — ACETAMINOPHEN 500 MG PO TABS
ORAL_TABLET | ORAL | Status: AC
  Administered 2020-11-12: 1000 mg via ORAL
  Filled 2020-11-12: qty 2

## 2020-11-12 MED ORDER — PROPOFOL 10 MG/ML IV BOLUS
INTRAVENOUS | Status: DC | PRN
  Administered 2020-11-12: 60 mg via INTRAVENOUS

## 2020-11-12 MED ORDER — FENTANYL CITRATE (PF) 100 MCG/2ML IJ SOLN
INTRAMUSCULAR | Status: AC
  Filled 2020-11-12: qty 2

## 2020-11-12 MED ORDER — METOCLOPRAMIDE HCL 5 MG/ML IJ SOLN: 5.0000 mg | Freq: Three times a day (TID) | INTRAMUSCULAR | Status: DC | PRN

## 2020-11-12 MED ORDER — ACETAMINOPHEN 10 MG/ML IV SOLN
INTRAVENOUS | Status: AC
  Filled 2020-11-12: qty 100

## 2020-11-12 MED ORDER — CHLORHEXIDINE GLUCONATE 0.12 % MT SOLN
OROMUCOSAL | Status: AC
  Administered 2020-11-12: 15 mL via OROMUCOSAL
  Filled 2020-11-12: qty 15

## 2020-11-12 MED ORDER — MIDAZOLAM HCL 5 MG/5ML IJ SOLN
INTRAMUSCULAR | Status: DC | PRN
  Administered 2020-11-12: 2 mg via INTRAVENOUS

## 2020-11-12 MED ORDER — ACETAMINOPHEN 325 MG PO TABS: 325.0000 mg | ORAL_TABLET | Freq: Four times a day (QID) | ORAL | Status: DC | PRN

## 2020-11-12 MED ORDER — ONDANSETRON HCL 4 MG PO TABS: 4.0000 mg | ORAL_TABLET | Freq: Four times a day (QID) | ORAL | Status: DC | PRN

## 2020-11-12 MED ORDER — SODIUM CHLORIDE 0.9 % IV SOLN
INTRAVENOUS | Status: DC | PRN
  Administered 2020-11-12: 20 ug/min via INTRAVENOUS

## 2020-11-12 MED ORDER — ONDANSETRON HCL 4 MG/2ML IJ SOLN: 4.0000 mg | Freq: Once | INTRAMUSCULAR | Status: DC | PRN

## 2020-11-12 SURGICAL SUPPLY — 59 items
BLADE SAW SAG 25X90X1.19 (BLADE) ×2 IMPLANT
BLADE SURG SZ20 CARB STEEL (BLADE) ×2 IMPLANT
BNDG ELASTIC 6X5.8 VLCR NS LF (GAUZE/BANDAGES/DRESSINGS) ×2 IMPLANT
CANISTER SUCT 1200ML W/VALVE (MISCELLANEOUS) ×2 IMPLANT
CANISTER SUCT 3000ML PPV (MISCELLANEOUS) ×2 IMPLANT
CEMENT BONE R 1X40 (Cement) ×4 IMPLANT
CEMENT VACUUM MIXING SYSTEM (MISCELLANEOUS) ×2 IMPLANT
CHLORAPREP W/TINT 26 (MISCELLANEOUS) ×2 IMPLANT
COOLER POLAR GLACIER W/PUMP (MISCELLANEOUS) ×2 IMPLANT
COVER MAYO STAND REUSABLE (DRAPES) ×2 IMPLANT
COVER WAND RF STERILE (DRAPES) ×2 IMPLANT
CUFF TOURN SGL QUICK 24 (TOURNIQUET CUFF)
CUFF TOURN SGL QUICK 30 (TOURNIQUET CUFF)
CUFF TRNQT CYL 24X4X16.5-23 (TOURNIQUET CUFF) IMPLANT
CUFF TRNQT CYL 30X4X21-28X (TOURNIQUET CUFF) IMPLANT
DRAPE 3/4 80X56 (DRAPES) ×2 IMPLANT
DRAPE IMP U-DRAPE 54X76 (DRAPES) ×2 IMPLANT
DRSG MEPILEX SACRM 8.7X9.8 (GAUZE/BANDAGES/DRESSINGS) IMPLANT
DRSG OPSITE POSTOP 4X10 (GAUZE/BANDAGES/DRESSINGS) ×2 IMPLANT
DRSG OPSITE POSTOP 4X8 (GAUZE/BANDAGES/DRESSINGS) ×2 IMPLANT
ELECT REM PT RETURN 9FT ADLT (ELECTROSURGICAL) ×2
ELECTRODE REM PT RTRN 9FT ADLT (ELECTROSURGICAL) ×1 IMPLANT
GLOVE BIO SURGEON STRL SZ7.5 (GLOVE) ×8 IMPLANT
GLOVE BIO SURGEON STRL SZ8 (GLOVE) ×8 IMPLANT
GLOVE BIOGEL PI IND STRL 8 (GLOVE) ×1 IMPLANT
GLOVE BIOGEL PI INDICATOR 8 (GLOVE) ×1
GLOVE INDICATOR 8.0 STRL GRN (GLOVE) ×2 IMPLANT
GOWN STRL REUS W/ TWL LRG LVL3 (GOWN DISPOSABLE) ×1 IMPLANT
GOWN STRL REUS W/ TWL XL LVL3 (GOWN DISPOSABLE) ×1 IMPLANT
GOWN STRL REUS W/TWL LRG LVL3 (GOWN DISPOSABLE) ×1
GOWN STRL REUS W/TWL XL LVL3 (GOWN DISPOSABLE) ×1
HOLDER FOLEY CATH W/STRAP (MISCELLANEOUS) IMPLANT
HOOD PEEL AWAY FLYTE STAYCOOL (MISCELLANEOUS) ×6 IMPLANT
INSERT TIB BEARING 67X10 (Insert) ×2 IMPLANT
KIT TURNOVER KIT A (KITS) ×2 IMPLANT
KNEE CR FEMORAL RT 65MM (Femur) ×2 IMPLANT
MANIFOLD NEPTUNE II (INSTRUMENTS) ×2 IMPLANT
NEEDLE SPNL 20GX3.5 QUINCKE YW (NEEDLE) ×2 IMPLANT
NS IRRIG 1000ML POUR BTL (IV SOLUTION) ×2 IMPLANT
PACK TOTAL KNEE (MISCELLANEOUS) ×2 IMPLANT
PAD WRAPON POLAR KNEE (MISCELLANEOUS) ×1 IMPLANT
PATELLA SERIES A (Orthopedic Implant) ×2 IMPLANT
PENCIL SMOKE EVACUATOR (MISCELLANEOUS) ×2 IMPLANT
PLATE INTERLOK 6700 (Plate) ×2 IMPLANT
PULSAVAC PLUS IRRIG FAN TIP (DISPOSABLE) ×2
SOL .9 NS 3000ML IRR  AL (IV SOLUTION) ×1
SOL .9 NS 3000ML IRR UROMATIC (IV SOLUTION) ×1 IMPLANT
STAPLER SKIN PROX 35W (STAPLE) ×2 IMPLANT
SUCTION FRAZIER HANDLE 10FR (MISCELLANEOUS) ×1
SUCTION TUBE FRAZIER 10FR DISP (MISCELLANEOUS) ×1 IMPLANT
SUT VIC AB 0 CT1 36 (SUTURE) ×6 IMPLANT
SUT VIC AB 2-0 CT1 27 (SUTURE) ×3
SUT VIC AB 2-0 CT1 TAPERPNT 27 (SUTURE) ×3 IMPLANT
SYR 10ML LL (SYRINGE) ×2 IMPLANT
SYR 20ML LL LF (SYRINGE) ×2 IMPLANT
SYR 30ML LL (SYRINGE) IMPLANT
TIP FAN IRRIG PULSAVAC PLUS (DISPOSABLE) ×1 IMPLANT
TRAY FOLEY MTR SLVR 16FR STAT (SET/KITS/TRAYS/PACK) IMPLANT
WRAPON POLAR PAD KNEE (MISCELLANEOUS) ×2

## 2020-11-12 NOTE — Discharge Instructions (Addendum)
Orthopedic discharge instructions: May shower with intact op-site dressing.  Apply ice frequently to knee or use Polar Care. Take Eliquis 2.5 mg BID for 2 weeks starting tomorrow, then take aspirin 325 mg daily for 4 weeks. Take oxycodone as prescribed when needed.  May supplement with ES Tylenol if necessary. May weight-bear as tolerated on right leg - use walker for balance and support. Follow-up in 10-14 days or as scheduled. AMBULATORY SURGERY  DISCHARGE INSTRUCTIONS   1) The drugs that you were given will stay in your system until tomorrow so for the next 24 hours you should not:  A) Drive an automobile B) Make any legal decisions C) Drink any alcoholic beverage   2) You may resume regular meals tomorrow.  Today it is better to start with liquids and gradually work up to solid foods.  You may eat anything you prefer, but it is better to start with liquids, then soup and crackers, and gradually work up to solid foods.   3) Please notify your doctor immediately if you have any unusual bleeding, trouble breathing, redness and pain at the surgery site, drainage, fever, or pain not relieved by medication.    4) Additional Instructions: Keep your gren band on for 4 days to indicate you have had long acting numbing medicine  Please contact your physician with any problems or Same Day Surgery at [phone], Monday through Friday 6 am to 4 pm, or [place] at [facility] number at [phone].

## 2020-11-12 NOTE — Anesthesia Procedure Notes (Signed)
Spinal  Patient location during procedure: OR Start time: 11/12/2020 7:34 AM End time: 11/12/2020 7:40 AM Staffing Performed: resident/CRNA  Anesthesiologist: Yevette Edwards, MD Resident/CRNA: Henrietta Hoover, CRNA Preanesthetic Checklist Completed: patient identified, IV checked, site marked, risks and benefits discussed, surgical consent, monitors and equipment checked, pre-op evaluation and timeout performed Spinal Block Patient position: sitting Prep: DuraPrep Patient monitoring: heart rate, cardiac monitor, continuous pulse ox and blood pressure Approach: midline Location: L3-4 Injection technique: single-shot Needle Needle type: Whitacre  Needle gauge: 22 G Needle length: 9 cm Assessment Sensory level: T4

## 2020-11-12 NOTE — Op Note (Signed)
11/12/2020  9:54 AM  Patient:   [name]  Pre-Op Diagnosis:   Degenerative joint disease, right knee.  Post-Op Diagnosis:   Same  Procedure:   Right TKA using all-cemented Biomet Vanguard system with a 65 mm PCR femur, a 67 mm tibial tray with a 10 mm anterior stabilized E-poly insert, and a 31 x 8 mm all-poly 3-pegged domed patella.  Surgeon:   Pascal Lux, MD  Assistant:   Cameron Proud, PA-C   Anesthesia:   Spinal  Findings:   As above  Complications:   None  EBL:   5 cc  Fluids:   700 cc crystalloid  UOP:   None  TT:   80 minutes at 300 mmHg  Drains:   None  Closure:   Staples  Implants:   As above  Brief Clinical Note:   The patient is a 65 year old female with a long history of progressively worsening right knee pain. The patient's symptoms have progressed despite medications, activity modification, injections, etc. The patient's history and examination were consistent with advanced degenerative joint disease of the right knee confirmed by plain radiographs. The patient presents at this time for a right total knee arthroplasty.  Procedure:   The patient was brought into the operating room. After adequate spinal anesthesia was obtained, the patient was lain in the supine position before the right lower extremity was prepped with ChloraPrep solution and draped sterilely. Preoperative antibiotics were administered. After verifying the proper laterality with a surgical timeout, the limb was exsanguinated with an Esmarch and the tourniquet inflated to 300 mmHg. A standard anterior approach to the knee was made through an approximately 7 inch incision. The incision was carried down through the subcutaneous tissues to expose superficial retinaculum. This was split the length of the incision and the medial flap elevated sufficiently to expose the medial retinaculum. The medial retinaculum was incised, leaving a 3-4 mm cuff of tissue on the patella. This was extended  distally along the medial border of the patellar tendon and proximally through the medial third of the quadriceps tendon. A subtotal fat pad excision was performed before the soft tissues were elevated off the anteromedial and anterolateral aspects of the proximal tibia to the level of the collateral ligaments. The anterior portions of the medial and lateral menisci were removed, as was the anterior cruciate ligament. With the knee flexed to 90, the external tibial guide was positioned and the appropriate proximal tibial cut made. This piece was taken to the back table where it was measured and found to be optimally replicated by a 67 mm component.  Attention was directed to the distal femur. The intramedullary canal was accessed through a 3/8" drill hole. The intramedullary guide was inserted and positioned in order to obtain a neutral flexion gap. The intercondylar block was positioned with care taken to avoid notching the anterior cortex of the femur. The appropriate cut was made. Next, the distal cutting block was placed at 5 of valgus alignment. Using the 9 mm slot, the distal cut was made. The distal femur was measured and found to be optimally replicated by the 65 mm component. The 65 mm 4-in-1 cutting block was positioned and first the posterior, then the posterior chamfer, the anterior chamfer, and finally the anterior cuts were made. At this point, the posterior portions medial and lateral menisci were removed. A trial reduction was performed using the appropriate femoral and tibial components with the 10 mm insert. This demonstrated excellent stability to  varus and valgus stressing both in flexion and extension while permitting full extension. Patella tracking was assessed and found to be excellent. Therefore, the tibial guide position was marked on the proximal tibia. The patella thickness was measured and found to be 20 mm. Therefore, the appropriate cut was made. The patellar surface was measured  and found to be optimally replicated by the 31 mm component. The three peg holes were drilled in place before the trial button was inserted. Patella tracking was assessed and found to be excellent, passing the "no thumb test". The lug holes were drilled into the distal femur before the trial component was removed, leaving only the tibial tray. The keel was then created using the appropriate tower, reamer, and punch.  The bony surfaces were prepared for cementing by irrigating them thoroughly with sterile saline solution via the jet lavage system. A bone plug was fashioned from some of the bone that had been removed previously and used to plug the distal femoral canal. In addition, 20 cc of Exparel diluted out to 60 cc with normal saline and 30 cc of 0.5% Sensorcaine were injected into the postero-medial and postero-lateral aspects of the knee, the medial and lateral gutter regions, and the peri-incisional tissues to help with postoperative analgesia. Meanwhile, the cement was being mixed on the back table. When it was ready, the tibial tray was cemented in first. The excess cement was removed using Personal assistant. Next, the femoral component was impacted into place. Again, the excess cement was removed using Personal assistant. The 10 mm trial insert was positioned and the knee brought into extension while the cement hardened. Finally, the patella was cemented into place and secured using the patellar clamp. Again, the excess cement was removed using Personal assistant. Once the cement had hardened, the knee was placed through a range of motion with the findings as described above. Therefore, the trial insert was removed and, after verifying that no cement had been retained posteriorly, the permanent 10 mm anterior stabilized E-polyethylene insert was positioned and secured using the appropriate key locking mechanism. Again the knee was placed through a range of motion with the findings as described above.  The wound  was copiously irrigated with sterile saline solution using the jet lavage system before the quadriceps tendon and retinacular layer were reapproximated using #0 Vicryl interrupted sutures. The superficial retinacular layer also was closed using a running #0 Vicryl suture. A total of 10 cc of transexemic acid (TXA) was injected intra-articularly before the subcutaneous tissues were closed in several layers using 2-0 Vicryl interrupted sutures. The skin was closed using staples. A sterile honeycomb dressing was applied to the skin before the leg was wrapped with an Ace wrap to accommodate the Polar Care device. The patient was then awakened and returned to the recovery room in satisfactory condition after tolerating the procedure well.

## 2020-11-12 NOTE — H&P (Signed)
History of Present Illness:  [name] is a 65 y.o. female that presents to clinic today for her preoperative history and evaluation. Patient presents unaccompanied. The patient is scheduled to undergo a right total knee arthroplasty on 11/12/20 by Dr. Roland Rack. The patient has a long history of right knee pain. The pain is located primarily along the medial aspect of the knee. She describes her pain as worse with weightbearing. She denies associated numbness or tingling.   The patient's symptoms have progressed to the point that they decrease her quality of life. The patient has previously undergone conservative treatment including NSAIDS and injections to the knee without adequate control of her symptoms.  Patient reports vomiting when she takes penicillin. Denies history of lumbar surgery. She does have a history of CVA as well as MI, for which she sees cardiology. She has received cardiac clearance.   Past Medical, Surgical, Family, Social History, Allergies, Medications:   Past Medical History:  . Allergic state  . Anxiety attack  . CHF (congestive heart failure) (CMS-HCC)  . Hx of migraines  . Hyperlipidemia  . Hypertension  . Myocardial infarction (CMS-HCC)  . Osteoarthritis 03/14/2014  . Stroke (CMS-HCC)   Past Surgical History:  . CESAREAN SECTION  . JOINT ASPIRATION/INJECTION 03/14/2014  . KNEE ARTHROSCOPY  . Left TKA using all cemented biomet vanguard system with a 65 mm PCR femur a 67 mm tibial tray with a 10 mm E-Poly insert and a 31 x 8 mm all poly 3 pegged domed patella Left 10/06/2016 Dr.Yassmin Binegar  . TUBAL LIGATION   Current Medications:  . acetaminophen (TYLENOL) 500 MG tablet Take 500 mg by mouth every 6 (six) hours as needed.  Marland Kitchen aspirin 81 MG EC tablet Take 81 mg by mouth once daily.  Marland Kitchen atorvastatin (LIPITOR) 80 MG tablet TAKE 1 TABLET BY MOUTH EVERY DAY (Patient not taking: Reported on 08/14/2020) 90 tablet 1  . buPROPion (WELLBUTRIN SR) 150 MG SR tablet Take 1 tablet by mouth  2 (two) times daily 0  . butalbital-acetaminophen-caffeine (FIORICET) 50-325-40 mg tablet Take 1 tablet by mouth every 4 (four) hours as needed for Headache 30 tablet 1  . CHOLECALCIFEROL, VITAMIN D3, (VITAMIN D3 ORAL) Take 1 tablet by mouth once daily.  Marland Kitchen ezetimibe (ZETIA) 10 mg tablet Take 10 mg by mouth once daily  . FLUoxetine (PROZAC) 20 MG tablet Take 20 mg by mouth once daily  . FLUoxetine (PROZAC) 40 MG capsule Take 40 mg by mouth daily with breakfast  . FUROsemide (LASIX) 20 MG tablet TAKE 1 TABLET BY MOUTH ONCE DAILY AS NEEDED FOR EDEMA 30 tablet 5  . hydrOXYzine (ATARAX) 25 MG tablet Take 1 tablet by mouth 2 (two) times daily as needed.  . metoprolol succinate (TOPROL-XL) 25 MG XL tablet TAKE 1 TABLET BY MOUTH EVERY DAY 30 tablet 5  . nitrofurantoin, macrocrystal-monohydrate, (MACROBID) 100 MG capsule Take 1 capsule (100 mg total) by mouth once daily for 90 days 30 capsule 2  . nitroGLYcerin (NITROSTAT) 0.4 MG SL tablet Place 1 tablet (0.4 mg total) under the tongue every 5 (five) minutes as needed for Chest pain. May take up to 3 doses. 25 tablet 0  . pantoprazole (PROTONIX) 40 MG DR tablet TAKE 1 TABLET(40 MG) BY MOUTH TWICE DAILY BEFORE MEALS 60 tablet 5  . potassium chloride (K-DUR,KLOR-CON) 20 MEQ ER tablet Take by mouth. Take 1 tablet (20 mEq total) by mouth 3 (three) times daily.  . rosuvastatin (CRESTOR) 20 MG tablet  . triamterene-hydrochlorothiazide (DYAZIDE)  37.5-25 mg capsule Take 1 capsule by mouth every morning 30 capsule 11  . zolpidem (AMBIEN) 10 mg tablet Take by mouth. Take 10 mg by mouth at bedtime as needed for sleep.   No current facility-administered medications for this visit.   Allergies:  . Penicillin G Nausea and Vomiting  . Codeine Unknown and Vomiting  . Sulfa (Sulfonamide Antibiotics) Vomiting   Social History:   Socioeconomic History:  Marland Kitchen Marital status: Divorced  Spouse name: Not on file  . Number of children: Not on file  . Years of education:  Not on file  . Highest education level: Not on file  Occupational History  . Not on file  Tobacco Use  . Smoking status: Light Tobacco Smoker  Types: Cigarettes  Last attempt to quit: 03/14/2013  Years since quitting: 7.6  . Smokeless tobacco: Never Used  Vaping Use  . Vaping Use: Never used  Substance and Sexual Activity  . Alcohol use: No  . Drug use: No  . Sexual activity: Yes  Partners: Male  Other Topics Concern  . Not on file  Social History Narrative  . Not on file   Social Determinants of Health:   Financial Resource Strain: Not on file  Food Insecurity: Not on file  Transportation Needs: Not on file  Physical Activity: Not on file  Stress: Not on file  Social Connections: Not on file  Housing Stability: Not on file   Family History:  . Breast cancer Maternal Grandmother  . High blood pressure (Hypertension) Mother  . Stroke Mother  . No Known Problems Father  . Stroke Sister   Review of Systems:  A 10+ ROS was performed, reviewed, and the pertinent orthopaedic findings are documented in the HPI.   Physical Examination:  BP 120/80  Ht 157.5 cm (5\' 2" )  Wt 65.4 kg (144 lb 3.2 oz)  BMI 26.37 kg/m   Patient is a well-developed, well-nourished female in no acute distress. Patient has normal mood and affect. Patient is alert and oriented to person, place, and time.   HEENT: Atraumatic, normocephalic. Pupils equal and reactive to light. Extraocular motion intact. Noninjected sclera.  Cardiovascular: Regular rate and rhythm, with no murmurs, rubs, or gallops. Distal pulses palpable.  Respiratory: Lungs clear to auscultation bilaterally.   Right Lower Extremities: Examination of the right lower extremity reveals no bony abnormality, no edema, mild effusion and no ecchymosis. There is mild varus abnormality. The patient is non-tender along the lateral joint line, and is moderately tender along the medial joint line. The patient is able to fully extend the right  knee is able to flex up to 110 degrees with moderate discomfort. Negative rotational McMurray's test. There is no retropatellar discomfort. The patient has a negative patella stretch test. The patient is pseudolaxity with valgus knee testing for the right knee. The patient has a negative Lachman's test.  Sensation intact over the saphenous, lateral sural cutaneous, superficial fibular, and deep fibular nerve distributions.  Tests Performed/Reviewed:  Previous radiographs were reviewed of the right knee and revealed severe loss of medial compartment joint space with bone-on-bone contact and osteophyte formation. No fractures or dislocations noted.  Impression:  Primary osteoarthritis of right knee.   Plan:  The patient has end-stage degenerative changes of the right knee. It was explained to the patient that the condition is progressive in nature. Having failed conservative treatment, the patient has elected to proceed with a total joint arthroplasty. The patient will undergo a total joint arthroplasty with  Dr. Joice Lofts. The risks of surgery, including blood clot and infection, were discussed with the patient. Measures to reduce these risks, including the use of anticoagulation, perioperative antibiotics, and early ambulation were discussed. The importance of postoperative physical therapy was discussed with the patient. The patient elects to proceed with surgery. The patient is instructed to stop all blood thinners prior to surgery.   H&P reviewed and patient re-examined. No changes.

## 2020-11-12 NOTE — Anesthesia Preprocedure Evaluation (Signed)
Anesthesia Evaluation  Patient identified by MRN, date of birth, ID band Patient awake    Reviewed: Allergy & Precautions, H&P , NPO status , Patient's Chart, lab work & pertinent test results, reviewed documented beta blocker date and time   History of Anesthesia Complications (+) PONV and history of anesthetic complications  Airway Mallampati: II   Neck ROM: full    Dental  (+) Teeth Intact   Pulmonary neg pulmonary ROS, former smoker,    Pulmonary exam normal        Cardiovascular hypertension, + angina + CAD, + Past MI and +CHF  negative cardio ROS Normal cardiovascular exam Rhythm:regular Rate:Normal     Neuro/Psych  Headaches, PSYCHIATRIC DISORDERS Anxiety Depression CVA negative neurological ROS  negative psych ROS   GI/Hepatic negative GI ROS, Neg liver ROS, GERD  ,  Endo/Other  negative endocrine ROS  Renal/GU Renal diseasenegative Renal ROS  negative genitourinary   Musculoskeletal   Abdominal   Peds  Hematology negative hematology ROS (+)   Anesthesia Other Findings Past Medical History: No date: Anxiety No date: Arthritis No date: CAD (coronary artery disease)     Comment:  a. 03/2013 NSTEMI/Cath: 100 RCA, EF 45%->Med Rx;  b.               12/2013 Cath/PCI: LAD 68m, 30d, D1 80, D2 60, LCX nl, RCA               50ost/p, 1m, 95d (2.5x33 Xience DES);  c. 05/2014 Lexi               MV: EF 68%, no ischemia; d. stress echo 12/2015 no               ischemia @ max exercise (did not achieve target) No date: Carotid disease, bilateral ([place])     Comment:  a. 08/2013 Carotid U/S: 1-39% bilat ICA stenosis. No date: Chronic kidney disease     Comment:  Chronic kidney infections. Takes daily preventative. No date: Complication of anesthesia No date: CVA (cerebral vascular accident) ([place])     Comment:  a. 08/2013. No date: CVA (cerebral vascular accident) ([place]) No date: Decreased libido No date: Depression No  date: GERD (gastroesophageal reflux disease) 07/2019: History of 2019 novel coronavirus disease (COVID-19) No date: History of recurrent UTIs No date: Hypercholesteremia No date: Hyperlipemia No date: Hypertension No date: Insomnia No date: Ischemic cardiomyopathy     Comment:  a. 03/2013 Echo: EF 30-35%, mod dil LA, mod-sev MR, mod               TR;  b. 08/2013 Echo EF 60-65%, mild AI. No date: Menopause No date: Migraines No date: Myocardial infarction ([place]) No date: PONV (postoperative nausea and vomiting)     Comment:  If anesthsia is given slowly, pt will not throw up, if               given quickly, pt will have nausea and vomiting. No date: Right lower quadrant pain No date: Syncope and collapse No date: Tobacco abuse No date: Vaginal atrophy Past Surgical History: 01/01/2014: CARDIAC CATHETERIZATION 03/16/2013: CARDIAC CATHETERIZATION 12/2013: CARDIAC CATHETERIZATION 08/19/2016: CARDIAC CATHETERIZATION; N/A     Comment:  Procedure: Left Heart Cath and Coronary Angiography;                Surgeon: Minna Merritts, MD;  Location: [place]               CV LAB;  Service:  Cardiovascular;  Laterality: N/A; No date: CESAREAN SECTION WITH BILATERAL TUBAL LIGATION 01/01/2014: CORONARY ANGIOPLASTY WITH STENT PLACEMENT     Comment:  95% lesion with a drug eluting stent to the distal RCA. No date: ENDOMETRIAL ABLATION 11/27/2006: KNEE ARTHROSCOPY     Comment:  left knee  No date: OOPHORECTOMY 10/06/2016: TOTAL KNEE ARTHROPLASTY; Left     Comment:  Procedure: TOTAL KNEE ARTHROPLASTY;  Surgeon: Christena Flake, MD;  Location: [facility];  Service: Orthopedics;                Laterality: Left; No date: TUBAL LIGATION BMI    Body Mass Index: 26.34 kg/m     Reproductive/Obstetrics negative OB ROS                             Anesthesia Physical Anesthesia Plan  ASA: III  Anesthesia Plan: Spinal   Post-op Pain Management:    Induction:    PONV Risk Score and Plan:   Airway Management Planned:   Additional Equipment:   Intra-op Plan:   Post-operative Plan:   Informed Consent: I have reviewed the patients History and Physical, chart, labs and discussed the procedure including the risks, benefits and alternatives for the proposed anesthesia with the patient or authorized representative who has indicated his/her understanding and acceptance.     Dental Advisory Given  Plan Discussed with: CRNA  Anesthesia Plan Comments:         Anesthesia Quick Evaluation

## 2020-11-12 NOTE — Evaluation (Signed)
Physical Therapy Evaluation Patient Details Name: [name] MRN: [id number] DOB: 02-24-1956 Today's Date: 11/12/2020   History of Present Illness  seen in post-op area s/p R TKR, WBAT (11/12/20)  Clinical Impression  Patient seated in recliner upon arrival to room; alert, oriented and eager for mobility trials.  Rates R knee pain 4/10; meds administered prior to session.  Endorses mild paresthesia lateral aspect of R foot; otherwise, sensation fully returned throughout LEs.  R LE with good post-op strength (3-/5) and ROM (5-73 degrees); good isolated muscle activation and control with therex and with functional activities.  Able to complete sit/stand, basic transfers and gait (100') with RW, cga/close sup. Demonstrates partially reciprocal stepping pattern, fair/good loading and stance time R LE; cautious cadence, but no buckling or LOB.  Good R knee stability, good confidence with mobility. Able to complete stairs (up/down 4 with bilat rails), cga for safety.  Good technique; good stability in R LE.   Educated on HEP (issued handout with written/pictorial descriptions), car transfer technique, use of RW, mobility progression, positioning, use of polar care.  Patient voiced understanding of all information.  No further questions at this time.  Eager for pending discharge home. Would benefit from skilled PT to address above deficits and promote optimal return to PLOF.; Recommend transition to HHPT upon discharge from acute hospitalization.     Follow Up Recommendations Home health PT    Equipment Recommendations   (has RW and BSC at home)    Recommendations for Other Services       Precautions / Restrictions Precautions Precautions: Fall Restrictions Weight Bearing Restrictions: Yes RLE Weight Bearing: Weight bearing as tolerated      Mobility  Bed Mobility               General bed mobility comments: seated in recliner beginning/end of session    Transfers Overall  transfer level: Needs assistance Equipment used: Rolling walker (2 wheeled) Transfers: Sit to/from Stand Sit to Stand: Min guard;Supervision         General transfer comment: cuing for hand placement to prevent pulling on RW; fair WBing and active use of R LE with movement transitions  Ambulation/Gait Ambulation/Gait assistance: Min guard;Supervision Gait Distance (Feet): 100 Feet Assistive device: Rolling walker (2 wheeled)       General Gait Details: partially reciprocal stepping pattern, fair/good loading and stance time R LE; cautious cadence, but no buckling or LOB.  Good R knee stability, good confidence with mobility.  Stairs Stairs: Yes Stairs assistance: Min guard Stair Management: Two rails Number of Stairs: 4 General stair comments: min cuing for technique; returns demonstration with good control, stability.  Voices comfort with stair negotiation.  Wheelchair Mobility    Modified Rankin (Stroke Patients Only)       Balance Overall balance assessment: Needs assistance Sitting-balance support: No upper extremity supported;Feet supported Sitting balance-Leahy Scale: Good     Standing balance support: Bilateral upper extremity supported Standing balance-Leahy Scale: Fair                               Pertinent Vitals/Pain Pain Assessment: 0-10 Pain Score: 4  Pain Location: R knee Pain Descriptors / Indicators: Aching;Guarding;Grimacing Pain Intervention(s): Monitored during session;Repositioned;Limited activity within patient's tolerance    Home Living Family/patient expects to be discharged to:: Private residence Living Arrangements: Alone Available Help at Discharge: Family Type of Home: House Home Access: Level entry  Home Layout: One level Home Equipment: Walker - 2 wheels;Bedside commode Additional Comments: Planning to discharge to sister's home; 2-level (bed/bath on main) with 3 steps to enter, bilat rails (close enough to hold  both).  Sister able to provide 24/7 sup/assist as needed at discharge    Prior Function Level of Independence: Independent         Comments: Indep with ADLs, household and community mobilization without assist device; denies fall history.     Hand Dominance   Dominant Hand: Right    Extremity/Trunk Assessment   Upper Extremity Assessment Upper Extremity Assessment: Overall WFL for tasks assessed    Lower Extremity Assessment Lower Extremity Assessment:  (R knee grossly 3-/5 throughout, ROM 5-73 degrees)       Communication   Communication: No difficulties  Cognition Arousal/Alertness: Awake/alert Behavior During Therapy: WFL for tasks assessed/performed Overall Cognitive Status: Within Functional Limits for tasks assessed                                        General Comments      Exercises Total Joint Exercises Goniometric ROM: R knee: 5-73 degrees Other Exercises Other Exercises: Seated R LE therex, 1x10, active ROM: ankle pumps, quad sets, LAQs (with emphasis on knee flexion). Other Exercises: Educated on HEP (issued handout with written/pictorial descriptions), car transfer technique, use of RW, mobility progression, positioning, use of polar care.  Patient voiced understanding of all information.  No further questions at this time.   Assessment/Plan    PT Assessment Patient needs continued PT services  PT Problem List Decreased strength;Decreased range of motion;Decreased activity tolerance;Decreased mobility;Pain;Decreased skin integrity;Decreased knowledge of use of DME       PT Treatment Interventions DME instruction;Gait training;Stair training;Functional mobility training;Balance training;Therapeutic exercise;Therapeutic activities;Patient/family education    PT Goals (Current goals can be found in the Care Plan section)  Acute Rehab PT Goals Patient Stated Goal: to go home PT Goal Formulation: With patient Time For Goal Achievement:  11/26/20 Potential to Achieve Goals: Good    Frequency BID   Barriers to discharge        Co-evaluation               AM-PAC PT "6 Clicks" Mobility  Outcome Measure Help needed turning from your back to your side while in a flat bed without using bedrails?: None Help needed moving from lying on your back to sitting on the side of a flat bed without using bedrails?: None Help needed moving to and from a bed to a chair (including a wheelchair)?: A Little Help needed standing up from a chair using your arms (e.g., wheelchair or bedside chair)?: A Little Help needed to walk in hospital room?: A Little Help needed climbing 3-5 steps with a railing? : A Little 6 Click Score: 20    End of Session Equipment Utilized During Treatment: Gait belt Activity Tolerance: Patient tolerated treatment well Patient left: in chair;with call bell/phone within reach Nurse Communication: Mobility status PT Visit Diagnosis: Muscle weakness (generalized) (M62.81);Difficulty in walking, not elsewhere classified (R26.2)    Time: 1400-1434 PT Time Calculation (min) (ACUTE ONLY): 34 min   Charges:   PT Evaluation $PT Eval Moderate Complexity: 1 Mod PT Treatments $Therapeutic Activity: 8-22 mins       Shakirah Kirkey H. Manson Passey, PT, DPT, NCS 11/12/20, 4:23 PM [phone]

## 2020-11-12 NOTE — Transfer of Care (Signed)
Immediate Anesthesia Transfer of Care Note  Patient: [name]  Procedure(s) Performed: TOTAL KNEE ARTHROPLASTY (Right Knee)  Patient Location: PACU  Anesthesia Type:Spinal  Level of Consciousness: awake, drowsy and patient cooperative  Airway & Oxygen Therapy: Patient Spontanous Breathing  Post-op Assessment: Report given to RN and Post -op Vital signs reviewed and stable  Post vital signs: Reviewed and stable  Last Vitals:  Vitals Value Taken Time  BP 115/65 11/12/20 0946  Temp    Pulse 69 11/12/20 0947  Resp 16 11/12/20 0947  SpO2 94 % 11/12/20 0947  Vitals shown include unvalidated device data.  Last Pain:  Vitals:   11/12/20 0610  TempSrc: Oral  PainSc: 0-No pain         Complications: No complications documented.

## 2020-11-13 ENCOUNTER — Encounter: Payer: Self-pay | Admitting: Surgery

## 2020-11-13 DIAGNOSIS — I509 Heart failure, unspecified: Secondary | ICD-10-CM | POA: Diagnosis not present

## 2020-11-13 DIAGNOSIS — Z471 Aftercare following joint replacement surgery: Secondary | ICD-10-CM | POA: Diagnosis not present

## 2020-11-13 DIAGNOSIS — E785 Hyperlipidemia, unspecified: Secondary | ICD-10-CM | POA: Diagnosis not present

## 2020-11-13 DIAGNOSIS — Z8673 Personal history of transient ischemic attack (TIA), and cerebral infarction without residual deficits: Secondary | ICD-10-CM | POA: Diagnosis not present

## 2020-11-13 DIAGNOSIS — Z96651 Presence of right artificial knee joint: Secondary | ICD-10-CM | POA: Diagnosis not present

## 2020-11-13 DIAGNOSIS — G43909 Migraine, unspecified, not intractable, without status migrainosus: Secondary | ICD-10-CM | POA: Diagnosis not present

## 2020-11-13 DIAGNOSIS — I252 Old myocardial infarction: Secondary | ICD-10-CM | POA: Diagnosis not present

## 2020-11-13 DIAGNOSIS — I110 Hypertensive heart disease with heart failure: Secondary | ICD-10-CM | POA: Diagnosis not present

## 2020-11-13 DIAGNOSIS — Z7901 Long term (current) use of anticoagulants: Secondary | ICD-10-CM | POA: Diagnosis not present

## 2020-11-13 DIAGNOSIS — Z87891 Personal history of nicotine dependence: Secondary | ICD-10-CM | POA: Diagnosis not present

## 2020-11-13 DIAGNOSIS — F419 Anxiety disorder, unspecified: Secondary | ICD-10-CM | POA: Diagnosis not present

## 2020-11-14 NOTE — Anesthesia Postprocedure Evaluation (Signed)
Anesthesia Post Note  Patient: [name]  Procedure(s) Performed: TOTAL KNEE ARTHROPLASTY (Right Knee)  Patient location during evaluation: PACU Anesthesia Type: Spinal Level of consciousness: awake and alert Pain management: pain level controlled Vital Signs Assessment: post-procedure vital signs reviewed and stable Respiratory status: spontaneous breathing, nonlabored ventilation, respiratory function stable and patient connected to nasal cannula oxygen Cardiovascular status: blood pressure returned to baseline and stable Postop Assessment: no apparent nausea or vomiting Anesthetic complications: no   No complications documented.   Last Vitals:  Vitals:   11/12/20 1441 11/12/20 1552  BP: (!) 157/80 (!) 150/78  Pulse: 80 78  Resp: 16 16  Temp:    SpO2: 95% 96%    Last Pain:  Vitals:   11/12/20 1552  TempSrc:   PainSc: 3                  Yevette Edwards

## 2020-11-26 DIAGNOSIS — Z471 Aftercare following joint replacement surgery: Secondary | ICD-10-CM | POA: Diagnosis not present

## 2020-11-26 DIAGNOSIS — Z96651 Presence of right artificial knee joint: Secondary | ICD-10-CM | POA: Diagnosis not present

## 2020-11-26 DIAGNOSIS — G43909 Migraine, unspecified, not intractable, without status migrainosus: Secondary | ICD-10-CM | POA: Diagnosis not present

## 2020-11-26 DIAGNOSIS — E785 Hyperlipidemia, unspecified: Secondary | ICD-10-CM | POA: Diagnosis not present

## 2020-11-26 DIAGNOSIS — I110 Hypertensive heart disease with heart failure: Secondary | ICD-10-CM | POA: Diagnosis not present

## 2020-11-26 DIAGNOSIS — I252 Old myocardial infarction: Secondary | ICD-10-CM | POA: Diagnosis not present

## 2020-11-26 DIAGNOSIS — Z7901 Long term (current) use of anticoagulants: Secondary | ICD-10-CM | POA: Diagnosis not present

## 2020-11-26 DIAGNOSIS — Z8673 Personal history of transient ischemic attack (TIA), and cerebral infarction without residual deficits: Secondary | ICD-10-CM | POA: Diagnosis not present

## 2020-11-26 DIAGNOSIS — I509 Heart failure, unspecified: Secondary | ICD-10-CM | POA: Diagnosis not present

## 2020-11-26 DIAGNOSIS — Z87891 Personal history of nicotine dependence: Secondary | ICD-10-CM | POA: Diagnosis not present

## 2020-11-26 DIAGNOSIS — F419 Anxiety disorder, unspecified: Secondary | ICD-10-CM | POA: Diagnosis not present

## 2020-11-27 DIAGNOSIS — M25561 Pain in right knee: Secondary | ICD-10-CM | POA: Diagnosis not present

## 2020-11-27 DIAGNOSIS — M25461 Effusion, right knee: Secondary | ICD-10-CM | POA: Diagnosis not present

## 2020-12-02 DIAGNOSIS — M25561 Pain in right knee: Secondary | ICD-10-CM | POA: Diagnosis not present

## 2020-12-02 DIAGNOSIS — M25461 Effusion, right knee: Secondary | ICD-10-CM | POA: Diagnosis not present

## 2020-12-02 DIAGNOSIS — Z471 Aftercare following joint replacement surgery: Secondary | ICD-10-CM | POA: Diagnosis not present

## 2020-12-04 DIAGNOSIS — M25461 Effusion, right knee: Secondary | ICD-10-CM | POA: Diagnosis not present

## 2020-12-04 DIAGNOSIS — M25561 Pain in right knee: Secondary | ICD-10-CM | POA: Diagnosis not present

## 2020-12-10 DIAGNOSIS — M25461 Effusion, right knee: Secondary | ICD-10-CM | POA: Diagnosis not present

## 2020-12-10 DIAGNOSIS — M25561 Pain in right knee: Secondary | ICD-10-CM | POA: Diagnosis not present

## 2020-12-13 DIAGNOSIS — M25561 Pain in right knee: Secondary | ICD-10-CM | POA: Diagnosis not present

## 2020-12-13 DIAGNOSIS — M25461 Effusion, right knee: Secondary | ICD-10-CM | POA: Diagnosis not present

## 2020-12-20 DIAGNOSIS — M25561 Pain in right knee: Secondary | ICD-10-CM | POA: Diagnosis not present

## 2020-12-20 DIAGNOSIS — M25461 Effusion, right knee: Secondary | ICD-10-CM | POA: Diagnosis not present

## 2020-12-24 DIAGNOSIS — M25461 Effusion, right knee: Secondary | ICD-10-CM | POA: Diagnosis not present

## 2020-12-24 DIAGNOSIS — M25561 Pain in right knee: Secondary | ICD-10-CM | POA: Diagnosis not present

## 2020-12-27 DIAGNOSIS — M25561 Pain in right knee: Secondary | ICD-10-CM | POA: Diagnosis not present

## 2020-12-27 DIAGNOSIS — M1711 Unilateral primary osteoarthritis, right knee: Secondary | ICD-10-CM | POA: Diagnosis not present

## 2020-12-27 DIAGNOSIS — M25461 Effusion, right knee: Secondary | ICD-10-CM | POA: Diagnosis not present

## 2020-12-27 DIAGNOSIS — Z96651 Presence of right artificial knee joint: Secondary | ICD-10-CM | POA: Diagnosis not present

## 2021-01-08 DIAGNOSIS — M25461 Effusion, right knee: Secondary | ICD-10-CM | POA: Diagnosis not present

## 2021-01-08 DIAGNOSIS — M25561 Pain in right knee: Secondary | ICD-10-CM | POA: Diagnosis not present

## 2021-01-15 DIAGNOSIS — M25561 Pain in right knee: Secondary | ICD-10-CM | POA: Diagnosis not present

## 2021-01-15 DIAGNOSIS — M25461 Effusion, right knee: Secondary | ICD-10-CM | POA: Diagnosis not present

## 2021-01-22 DIAGNOSIS — R29898 Other symptoms and signs involving the musculoskeletal system: Secondary | ICD-10-CM | POA: Diagnosis not present

## 2021-01-24 DIAGNOSIS — R29898 Other symptoms and signs involving the musculoskeletal system: Secondary | ICD-10-CM | POA: Diagnosis not present

## 2021-01-24 NOTE — Progress Notes (Signed)
Cardiology Office Note  Date:  01/27/2021   ID:  [name], Hamme February 10, 1956, MRN [id number]  PCP:  Baxter Hire, MD   Chief Complaint  Patient presents with  . Follow-up    6 month F/U    HPI:  Ms. Curiale is a 65 year old woman with long history of  smoking who stopped in May 2014,  coronary artery disease with non-ST elevation in may 2015 with stent placed to her distal RCA,  stroke October 2014 evaluated at [facility].  Previous anginal symptoms included back pain, chest pain like an elephant sitting on her chest cardiac catheterization performed for anginal symptoms, Cath 08/19/2016:, No intervention performed  She presents for routine followup of her coronary artery disease.   In follow-up today reports that she has completed knee replacement, completed her therapy  Stressful day today,  Cleaning all day,for inspection by the state of her house  Has appreciated some palpitations, beat with a pause Can happen several times a day sometimes  Does regular exercise, Arts and crafts Stopped working back in 2014  Denies chest pain concerning for angina, may have rare episodes from time to time Reports that she quit smoking, wishes that she did not Not requiring nitro  EKG personally reviewed by myself on todays visit Normal sinus rhythm rate 77 bpm nonspecific T wave abnormality  Other past medical history reviewed covid positive sept 2020  cardiac catheterization  08/19/2016: Left anterior descending (LAD):30% proximal LAD disease, 40% mid LAD disease. 70% ostial and proximal disease of the D2 and D3 vessel Right coronary artery (RCA):40 to 50% ostial disease, 30% proximal disease.  Left ventriculography: Left ventricular systolic function is normal, LVEF is estimated at 55-65%, there is no significant mitral regurgitation , no significant aortic valve stenosis  Stable mild to moderate LAD disease Moderate to severe ostial and proximal diagonal disease  (unchanged or improved from previous study) Mild to Moderate ostial RCA disease, mild proximal RCA disease Normal EF No intervention performed   she was noted to have some coronary spasm that resolved with nitroglycerin   hospitalization 12/31/2013 where she presented with angina. He had a catheterization with distal RCA stent placed.  Also started on ranexa, continued on isosorbide.   Previous Cardiac catheterization report 01/01/2014 details 95% distal RCA lesion with stent placed. Also had 40% mid LAD, 30% distal LAD, 80% D1 disease, 60% D2 disease, 50% ostial RCA disease, 50% proximal RCA disease, 60% mid RCA disease stent placed was a xience  EX 2.5 millimeter stent  Cardiac catheterization in 03/16/2013 details occluded mid RCA with collaterals from distal LAD also 75% stenosis diagonal #1 near the ostium Ejection fraction at that time was 46% notes also detailed 40% mid LAD, 30% distal LAD, 50% D2 disease, 30% proximal circumflex, 75% proximal RCA disease  PMH:   has a past medical history of Anxiety, Arthritis, CAD (coronary artery disease), Carotid disease, bilateral ([place]), Chronic kidney disease, Complication of anesthesia, CVA (cerebral vascular accident) ([place]), CVA (cerebral vascular accident) ([place]), Decreased libido, Depression, GERD (gastroesophageal reflux disease), History of 2019 novel coronavirus disease (COVID-19) (07/2019), History of recurrent UTIs, Hypercholesteremia, Hyperlipemia, Hypertension, Insomnia, Ischemic cardiomyopathy, Menopause, Migraines, Myocardial infarction ([place]), PONV (postoperative nausea and vomiting), Right lower quadrant pain, Syncope and collapse, Tobacco abuse, and Vaginal atrophy.  PSH:    Past Surgical History:  Procedure Laterality Date  . CARDIAC CATHETERIZATION  01/01/2014  . CARDIAC CATHETERIZATION  03/16/2013  . CARDIAC CATHETERIZATION  12/2013  . CARDIAC CATHETERIZATION N/A  08/19/2016   Procedure: Left Heart Cath and Coronary Angiography;   Surgeon: Minna Merritts, MD;  Location: [place] CV LAB;  Service: Cardiovascular;  Laterality: N/A;  . CESAREAN SECTION WITH BILATERAL TUBAL LIGATION    . CORONARY ANGIOPLASTY WITH STENT PLACEMENT  01/01/2014   95% lesion with a drug eluting stent to the distal RCA.  Marland Kitchen ENDOMETRIAL ABLATION    . KNEE ARTHROSCOPY  11/27/2006   left knee   . OOPHORECTOMY    . TOTAL KNEE ARTHROPLASTY Left 10/06/2016   Procedure: TOTAL KNEE ARTHROPLASTY;  Surgeon: Corky Mull, MD;  Location: [facility] OR[facility];  Service: Orthopedics;  Laterality: Left;  . TOTAL KNEE ARTHROPLASTY Right 11/12/2020   Procedure: TOTAL KNEE ARTHROPLASTY;  Surgeon: Corky Mull, MD;  Location: [facility] OR[facility];  Service: Orthopedics;  Laterality: Right;  . TUBAL LIGATION      Current Outpatient Medications  Medication Sig Dispense Refill  . acetaminophen (TYLENOL) 500 MG tablet Take 500 mg by mouth every 6 (six) hours as needed for mild pain or moderate pain.     Marland Kitchen albuterol (VENTOLIN HFA) 108 (90 Base) MCG/ACT inhaler INHALE 2 PUFFS INTO THE LUNGS EVERY 6 HOURS AS NEEDED FOR WHEEZING OR SHORTNESS OF BREATH 54 g 0  . apixaban (ELIQUIS) 2.5 MG TABS tablet Take 1 tablet (2.5 mg total) by mouth 2 (two) times daily. 30 tablet 0  . Ascorbic Acid (VITAMIN C PO) Take 1 tablet by mouth daily.    Marland Kitchen buPROPion (WELLBUTRIN SR) 150 MG 12 hr tablet Take 150 mg by mouth 2 (two) times daily. (0800 & 1500)    . butalbital-acetaminophen-caffeine (FIORICET) 50-325-40 MG tablet Take 1-2 tablets by mouth every 6 (six) hours as needed for headache. 20 tablet 0  . Cholecalciferol (VITAMIN D PO) Take 1 capsule by mouth daily.    Marland Kitchen FLUoxetine (PROZAC) 40 MG capsule Take 40 mg by mouth daily.    . furosemide (LASIX) 20 MG tablet TAKE 1 TABLET(20 MG) BY MOUTH DAILY AS NEEDED FOR FLUID RETENTION OR SWELLING 90 tablet 2  . hydrOXYzine (ATARAX/VISTARIL) 25 MG tablet Take 25 mg by mouth daily as needed for anxiety.  3  . nitroGLYCERIN (NITROSTAT) 0.4 MG SL tablet DISSOLVE 1  TABLET UNDER THE TONGUE EVERY 5 MINUTES AS DIRECTED AS NEEDED FOR CHEST PAIN 25 tablet 3  . oxyCODONE (ROXICODONE) 5 MG immediate release tablet Take 1-2 tablets (5-10 mg total) by mouth every 4 (four) hours as needed for moderate pain or severe pain. 50 tablet 0  . rosuvastatin (CRESTOR) 20 MG tablet Take 20 mg by mouth every evening.    . triamterene-hydrochlorothiazide (DYAZIDE) 37.5-25 MG capsule Take 1 capsule by mouth daily.  5  . zolpidem (AMBIEN) 10 MG tablet Take 10 mg by mouth at bedtime as needed for sleep.     No current facility-administered medications for this visit.    Allergies:   Codeine sulfate and Penicillins   Social History:  The patient  reports that she quit smoking about 7 years ago. Her smoking use included cigarettes. She has a 5.00 pack-year smoking history. She has never used smokeless tobacco. She reports current alcohol use. She reports that she does not use drugs.   Family History:   family history includes Breast cancer in her paternal grandmother; Hyperlipidemia in her mother; Hypertension in her mother; Stroke in her mother.    Review of Systems: Review of Systems  Constitutional: Negative.   HENT: Negative.   Respiratory: Negative.   Cardiovascular:  Negative.   Gastrointestinal: Negative.   Musculoskeletal: Positive for joint pain.       Knee pain  Neurological: Negative.   Psychiatric/Behavioral: Negative.   All other systems reviewed and are negative.   PHYSICAL EXAM: VS:  BP (!) 170/100 (BP Location: Left Arm, Patient Position: Sitting, Cuff Size: Normal)   Pulse 77   Ht 5' 1.5" (1.562 m)   Wt 144 lb (65.3 kg)   SpO2 97%   BMI 26.77 kg/m  , BMI Body mass index is 26.77 kg/m. Constitutional:  oriented to person, place, and time. No distress.  HENT:  Head: Grossly normal Eyes:  no discharge. No scleral icterus.  Neck: No JVD, no carotid bruits  Cardiovascular: Regular rate and rhythm, no murmurs appreciated Pulmonary/Chest: Clear to  auscultation bilaterally, no wheezes or rails Abdominal: Soft.  no distension.  no tenderness.  Musculoskeletal: Normal range of motion Neurological:  normal muscle tone. Coordination normal. No atrophy Skin: Skin warm and dry Psychiatric: normal affect, pleasant  Recent Labs: 11/04/2020: ALT 22; BUN 18; Creatinine, Ser 0.79; Hemoglobin 12.9; Platelets 286; Potassium 3.6; Sodium 135    Lipid Panel Lab Results  Component Value Date   CHOL 156 02/18/2015   HDL 55 02/18/2015   LDLCALC 69 02/18/2015   TRIG 162 (H) 02/18/2015    Wt Readings from Last 3 Encounters:  01/27/21 144 lb (65.3 kg)  11/12/20 144 lb (65.3 kg)  11/04/20 144 lb 11.2 oz (65.6 kg)      ASSESSMENT AND PLAN:  CAD in native artery - Plan: EKG 12-Lead Currently with no symptoms of angina. No further workup at this time. Continue current medication regimen. Statin myalgias in the past Tolerating Crestor  Essential hypertension - Plan: EKG 12-Lead Elevated pressure today Had a busy day, cleaning apartment, inspection tomorrow We will start metoprolol succinate 25 up to 50 mg daily Recommend she monitor her blood pressure and call us with some numbers Typically blood pressure runs 366 systolic  Pure hypercholesterolemia Previously on Lipitor Zetia Continue Crestor 20  Bilateral carotid artery disease ([place]) Reports that she quit smoking Last ultrasound 2016, less than 39% disease bilaterally No bruits on exam  Angina pectoris ([place]) Currently without chest pain symptoms. Stable disease on cardiac catheterization In 2017  Currently with no symptoms of angina. No further workup at this time. Continue current medication regimen.  History of coronary artery stent placement On aspirin    Total encounter time more than 25 minutes  Greater than 50% was spent in counseling and coordination of care with the patient  Disposition:   F/U  12 months   No orders of the defined types were placed in this  encounter.    Signed, Esmond Plants, M.D., Ph.D. 01/27/2021  [place], [place]

## 2021-01-27 ENCOUNTER — Encounter: Payer: Self-pay | Admitting: Cardiovascular Disease

## 2021-01-27 ENCOUNTER — Other Ambulatory Visit: Payer: Self-pay

## 2021-01-27 ENCOUNTER — Ambulatory Visit: Payer: PPO | Admitting: Cardiovascular Disease

## 2021-01-27 VITALS — BP 170/100 | HR 77 | Ht 61.5 in | Wt 144.0 lb

## 2021-01-27 DIAGNOSIS — I951 Orthostatic hypotension: Secondary | ICD-10-CM

## 2021-01-27 DIAGNOSIS — I739 Peripheral vascular disease, unspecified: Secondary | ICD-10-CM | POA: Diagnosis not present

## 2021-01-27 DIAGNOSIS — I6523 Occlusion and stenosis of bilateral carotid arteries: Secondary | ICD-10-CM

## 2021-01-27 DIAGNOSIS — I5022 Chronic systolic (congestive) heart failure: Secondary | ICD-10-CM

## 2021-01-27 DIAGNOSIS — E782 Mixed hyperlipidemia: Secondary | ICD-10-CM

## 2021-01-27 DIAGNOSIS — I25118 Atherosclerotic heart disease of native coronary artery with other forms of angina pectoris: Secondary | ICD-10-CM

## 2021-01-27 DIAGNOSIS — I10 Essential (primary) hypertension: Secondary | ICD-10-CM

## 2021-01-27 DIAGNOSIS — I639 Cerebral infarction, unspecified: Secondary | ICD-10-CM | POA: Diagnosis not present

## 2021-01-27 MED ORDER — ASPIRIN EC 81 MG PO TBEC: 81.0000 mg | DELAYED_RELEASE_TABLET | Freq: Every day | ORAL | 3 refills | Status: DC

## 2021-01-27 MED ORDER — METOPROLOL SUCCINATE ER 50 MG PO TB24: 50.0000 mg | ORAL_TABLET | Freq: Every day | ORAL | 6 refills | Status: DC

## 2021-01-27 NOTE — Patient Instructions (Signed)
Medication Instructions:  Please start metoprolol succinate 50 mg daily  Ok to start 1/2 pill  Call with blood pressures  If you need a refill on your cardiac medications before your next appointment, please call your pharmacy.    Lab work: No new labs needed   If you have labs (blood work) drawn today and your tests are completely normal, you will receive your results only by: Marland Kitchen MyChart Message (if you have MyChart) OR . A paper copy in the mail If you have any lab test that is abnormal or we need to change your treatment, we will call you to review the results.   Testing/Procedures: No new testing needed   Follow-Up: At [facility] you and your health needs are our priority.  As part of our continuing mission to provide you with exceptional heart care, we have created designated Provider Care Teams.  These Care Teams include your primary Cardiologist (physician) and Advanced Practice Providers (APPs -  Physician Assistants and Nurse Practitioners) who all work together to provide you with the care you need, when you need it.  . You will need a follow up appointment in 12 months  . Providers on your designated Care Team:   . Murray Hodgkins, NP . Christell Faith, PA-C . Marrianne Mood, PA-C  Any Other Special Instructions Will Be Listed Below (If Applicable).  COVID-19 Vaccine Information can be found at: [email or web address] For questions related to vaccine distribution or appointments, please email [email or web address] or call [phone].

## 2021-02-10 DIAGNOSIS — E7800 Pure hypercholesterolemia, unspecified: Secondary | ICD-10-CM | POA: Diagnosis not present

## 2021-02-10 DIAGNOSIS — I10 Essential (primary) hypertension: Secondary | ICD-10-CM | POA: Diagnosis not present

## 2021-02-17 ENCOUNTER — Other Ambulatory Visit: Payer: Self-pay | Admitting: Internal Medicine

## 2021-02-17 DIAGNOSIS — Z720 Tobacco use: Secondary | ICD-10-CM | POA: Diagnosis not present

## 2021-02-17 DIAGNOSIS — Z1231 Encounter for screening mammogram for malignant neoplasm of breast: Secondary | ICD-10-CM | POA: Diagnosis not present

## 2021-02-17 DIAGNOSIS — Z8673 Personal history of transient ischemic attack (TIA), and cerebral infarction without residual deficits: Secondary | ICD-10-CM | POA: Diagnosis not present

## 2021-02-17 DIAGNOSIS — E7800 Pure hypercholesterolemia, unspecified: Secondary | ICD-10-CM | POA: Diagnosis not present

## 2021-02-17 DIAGNOSIS — I5022 Chronic systolic (congestive) heart failure: Secondary | ICD-10-CM | POA: Diagnosis not present

## 2021-02-17 DIAGNOSIS — I2510 Atherosclerotic heart disease of native coronary artery without angina pectoris: Secondary | ICD-10-CM | POA: Diagnosis not present

## 2021-02-17 DIAGNOSIS — Z1211 Encounter for screening for malignant neoplasm of colon: Secondary | ICD-10-CM | POA: Diagnosis not present

## 2021-02-17 DIAGNOSIS — F32A Depression, unspecified: Secondary | ICD-10-CM | POA: Diagnosis not present

## 2021-02-17 DIAGNOSIS — Z0001 Encounter for general adult medical examination with abnormal findings: Secondary | ICD-10-CM | POA: Diagnosis not present

## 2021-02-17 DIAGNOSIS — F411 Generalized anxiety disorder: Secondary | ICD-10-CM | POA: Diagnosis not present

## 2021-02-17 DIAGNOSIS — F17210 Nicotine dependence, cigarettes, uncomplicated: Secondary | ICD-10-CM | POA: Diagnosis not present

## 2021-02-17 DIAGNOSIS — I10 Essential (primary) hypertension: Secondary | ICD-10-CM | POA: Diagnosis not present

## 2021-02-17 DIAGNOSIS — Z0000 Encounter for general adult medical examination without abnormal findings: Secondary | ICD-10-CM | POA: Diagnosis not present

## 2021-04-09 ENCOUNTER — Emergency Department: Payer: PPO

## 2021-04-09 ENCOUNTER — Inpatient Hospital Stay
Admission: EM | Admit: 2021-04-09 | Discharge: 2021-04-10 | DRG: 065 | Disposition: A | Discharge status: Other Acute Inpatient Hospital | Payer: PPO | Attending: Internal Medicine | Admitting: Internal Medicine

## 2021-04-09 ENCOUNTER — Other Ambulatory Visit: Payer: Self-pay

## 2021-04-09 DIAGNOSIS — Z823 Family history of stroke: Secondary | ICD-10-CM

## 2021-04-09 DIAGNOSIS — Z880 Allergy status to penicillin: Secondary | ICD-10-CM

## 2021-04-09 DIAGNOSIS — I252 Old myocardial infarction: Secondary | ICD-10-CM

## 2021-04-09 DIAGNOSIS — R471 Dysarthria and anarthria: Secondary | ICD-10-CM | POA: Diagnosis present

## 2021-04-09 DIAGNOSIS — F419 Anxiety disorder, unspecified: Secondary | ICD-10-CM | POA: Diagnosis present

## 2021-04-09 DIAGNOSIS — F32A Depression, unspecified: Secondary | ICD-10-CM | POA: Diagnosis present

## 2021-04-09 DIAGNOSIS — I610 Nontraumatic intracerebral hemorrhage in hemisphere, subcortical: Secondary | ICD-10-CM | POA: Diagnosis present

## 2021-04-09 DIAGNOSIS — Z8249 Family history of ischemic heart disease and other diseases of the circulatory system: Secondary | ICD-10-CM

## 2021-04-09 DIAGNOSIS — R29810 Facial weakness: Secondary | ICD-10-CM | POA: Diagnosis present

## 2021-04-09 DIAGNOSIS — Z8616 Personal history of COVID-19: Secondary | ICD-10-CM | POA: Diagnosis not present

## 2021-04-09 DIAGNOSIS — I618 Other nontraumatic intracerebral hemorrhage: Principal | ICD-10-CM | POA: Diagnosis present

## 2021-04-09 DIAGNOSIS — Z87440 Personal history of urinary (tract) infections: Secondary | ICD-10-CM

## 2021-04-09 DIAGNOSIS — G936 Cerebral edema: Secondary | ICD-10-CM | POA: Diagnosis not present

## 2021-04-09 DIAGNOSIS — G4700 Insomnia, unspecified: Secondary | ICD-10-CM | POA: Diagnosis present

## 2021-04-09 DIAGNOSIS — F17210 Nicotine dependence, cigarettes, uncomplicated: Secondary | ICD-10-CM | POA: Diagnosis present

## 2021-04-09 DIAGNOSIS — Z83438 Family history of other disorder of lipoprotein metabolism and other lipidemia: Secondary | ICD-10-CM

## 2021-04-09 DIAGNOSIS — I10 Essential (primary) hypertension: Secondary | ICD-10-CM

## 2021-04-09 DIAGNOSIS — R410 Disorientation, unspecified: Secondary | ICD-10-CM | POA: Diagnosis not present

## 2021-04-09 DIAGNOSIS — I110 Hypertensive heart disease with heart failure: Secondary | ICD-10-CM | POA: Diagnosis not present

## 2021-04-09 DIAGNOSIS — I2510 Atherosclerotic heart disease of native coronary artery without angina pectoris: Secondary | ICD-10-CM | POA: Diagnosis present

## 2021-04-09 DIAGNOSIS — Z96653 Presence of artificial knee joint, bilateral: Secondary | ICD-10-CM | POA: Diagnosis present

## 2021-04-09 DIAGNOSIS — Z20822 Contact with and (suspected) exposure to covid-19: Secondary | ICD-10-CM | POA: Diagnosis present

## 2021-04-09 DIAGNOSIS — R29703 NIHSS score 3: Secondary | ICD-10-CM | POA: Diagnosis present

## 2021-04-09 DIAGNOSIS — Z8673 Personal history of transient ischemic attack (TIA), and cerebral infarction without residual deficits: Secondary | ICD-10-CM

## 2021-04-09 DIAGNOSIS — R9082 White matter disease, unspecified: Secondary | ICD-10-CM | POA: Diagnosis not present

## 2021-04-09 DIAGNOSIS — Z79899 Other long term (current) drug therapy: Secondary | ICD-10-CM

## 2021-04-09 DIAGNOSIS — G8194 Hemiplegia, unspecified affecting left nondominant side: Secondary | ICD-10-CM | POA: Diagnosis not present

## 2021-04-09 DIAGNOSIS — E782 Mixed hyperlipidemia: Secondary | ICD-10-CM | POA: Diagnosis present

## 2021-04-09 DIAGNOSIS — K219 Gastro-esophageal reflux disease without esophagitis: Secondary | ICD-10-CM | POA: Diagnosis not present

## 2021-04-09 DIAGNOSIS — R270 Ataxia, unspecified: Secondary | ICD-10-CM | POA: Diagnosis not present

## 2021-04-09 DIAGNOSIS — I5022 Chronic systolic (congestive) heart failure: Secondary | ICD-10-CM | POA: Diagnosis not present

## 2021-04-09 DIAGNOSIS — Z885 Allergy status to narcotic agent status: Secondary | ICD-10-CM

## 2021-04-09 DIAGNOSIS — I255 Ischemic cardiomyopathy: Secondary | ICD-10-CM | POA: Diagnosis not present

## 2021-04-09 DIAGNOSIS — Z716 Tobacco abuse counseling: Secondary | ICD-10-CM

## 2021-04-09 DIAGNOSIS — Z955 Presence of coronary angioplasty implant and graft: Secondary | ICD-10-CM | POA: Diagnosis not present

## 2021-04-09 DIAGNOSIS — Z7982 Long term (current) use of aspirin: Secondary | ICD-10-CM

## 2021-04-09 DIAGNOSIS — I619 Nontraumatic intracerebral hemorrhage, unspecified: Secondary | ICD-10-CM | POA: Diagnosis not present

## 2021-04-09 DIAGNOSIS — I161 Hypertensive emergency: Secondary | ICD-10-CM | POA: Diagnosis present

## 2021-04-09 DIAGNOSIS — Z9114 Patient's other noncompliance with medication regimen: Secondary | ICD-10-CM

## 2021-04-09 DIAGNOSIS — I639 Cerebral infarction, unspecified: Secondary | ICD-10-CM

## 2021-04-09 DIAGNOSIS — I739 Peripheral vascular disease, unspecified: Secondary | ICD-10-CM | POA: Diagnosis not present

## 2021-04-09 DIAGNOSIS — R404 Transient alteration of awareness: Secondary | ICD-10-CM | POA: Diagnosis not present

## 2021-04-09 DIAGNOSIS — Z90722 Acquired absence of ovaries, bilateral: Secondary | ICD-10-CM

## 2021-04-09 DIAGNOSIS — Z7901 Long term (current) use of anticoagulants: Secondary | ICD-10-CM | POA: Diagnosis not present

## 2021-04-09 LAB — CBC
HCT: 38.5 % (ref 36.0–46.0)
Hemoglobin: 13.1 g/dL (ref 12.0–15.0)
MCH: 30.7 pg (ref 26.0–34.0)
MCHC: 34 g/dL (ref 30.0–36.0)
MCV: 90.2 fL (ref 80.0–100.0)
Platelets: 273 10*3/uL (ref 150–400)
RBC: 4.27 MIL/uL (ref 3.87–5.11)
RDW: 13.7 % (ref 11.5–15.5)
WBC: 11.2 10*3/uL — ABNORMAL HIGH (ref 4.0–10.5)
nRBC: 0 % (ref 0.0–0.2)

## 2021-04-09 LAB — COMPREHENSIVE METABOLIC PANEL
ALT: 18 U/L (ref 0–44)
AST: 24 U/L (ref 15–41)
Albumin: 3.9 g/dL (ref 3.5–5.0)
Alkaline Phosphatase: 91 U/L (ref 38–126)
Anion gap: 10 (ref 5–15)
BUN: 18 mg/dL (ref 8–23)
CO2: 22 mmol/L (ref 22–32)
Calcium: 9.3 mg/dL (ref 8.9–10.3)
Chloride: 108 mmol/L (ref 98–111)
Creatinine, Ser: 0.84 mg/dL (ref 0.44–1.00)
GFR, Estimated: >60 mL/min (ref >60)
Glucose, Bld: 114 mg/dL — ABNORMAL HIGH (ref 70–99)
Potassium: 3.7 mmol/L (ref 3.5–5.1)
Sodium: 140 mmol/L (ref 135–145)
Total Bilirubin: 0.7 mg/dL (ref 0.3–1.2)
Total Protein: 7.7 g/dL (ref 6.5–8.1)

## 2021-04-09 LAB — PROTIME-INR
INR: 1 (ref 0.8–1.2)
Prothrombin Time: 13.4 seconds (ref 11.4–15.2)

## 2021-04-09 LAB — DIFFERENTIAL
Abs Immature Granulocytes: 0.06 10*3/uL (ref 0.00–0.07)
Basophils Absolute: 0.1 10*3/uL (ref 0.0–0.1)
Basophils Relative: 0 %
Eosinophils Absolute: 0.3 10*3/uL (ref 0.0–0.5)
Eosinophils Relative: 3 %
Immature Granulocytes: 1 %
Lymphocytes Relative: 19 %
Lymphs Abs: 2.2 10*3/uL (ref 0.7–4.0)
Monocytes Absolute: 1 10*3/uL (ref 0.1–1.0)
Monocytes Relative: 9 %
Neutro Abs: 7.7 10*3/uL (ref 1.7–7.7)
Neutrophils Relative %: 68 %

## 2021-04-09 LAB — APTT: aPTT: 25 seconds (ref 24–36)

## 2021-04-09 LAB — CBG MONITORING, ED: Glucose-Capillary: 117 mg/dL — ABNORMAL HIGH (ref 70–99)

## 2021-04-09 MED ORDER — NICARDIPINE HCL IN NACL 20-0.86 MG/200ML-% IV SOLN
0.0000 mg/h | INTRAVENOUS | Status: DC
  Administered 2021-04-09: 5 mg/h via INTRAVENOUS
  Administered 2021-04-10: 15 mg/h via INTRAVENOUS
  Administered 2021-04-10: 7.5 mg/h via INTRAVENOUS
  Filled 2021-04-09 (×5): qty 200

## 2021-04-09 MED ORDER — SODIUM CHLORIDE 0.9% FLUSH
3.0000 mL | Freq: Once | INTRAVENOUS | Status: AC
  Administered 2021-04-09: 3 mL via INTRAVENOUS

## 2021-04-09 MED ORDER — LABETALOL HCL 5 MG/ML IV SOLN
20.0000 mg | Freq: Once | INTRAVENOUS | Status: AC
  Administered 2021-04-09: 20 mg via INTRAVENOUS
  Filled 2021-04-09: qty 4

## 2021-04-09 NOTE — ED Triage Notes (Signed)
Pt arrives via ems from home, pt took a 10mg  ambien last pm around 2200, pt woke up this am at 0430 with difficulty moving and weakness, pt has cont to have weakness through out the day with a few falls

## 2021-04-09 NOTE — ED Provider Notes (Signed)
[facility] Emergency Department Provider Note  ____________________________________________  Time seen: Approximately 11:37 PM  I have reviewed the triage vital signs and the nursing notes.   HISTORY  Chief Complaint Weakness    HPI [name] is a 65 y.o. female reports being in her usual state of health last night at bedtime, woke up this morning with a feeling of left leg weakness, feeling off balance and falling to the left whenever she tried to walk throughout the day.  Symptoms of been constant without aggravating or alleviating factors.  Denies headache.  Denies vision changes.  No paresthesia.  Denies head trauma or neck pain.   She does have a history of hypertension.  She does not recall the name of any of her medications or specifically what she is taking but believes that she has been compliant with her medication regimen.  Grocery bag of pill bottles that she brought with her includes various medications, some duplicates.  Also has Eliquis 2.5 mg tablets that were prescribed in January after her knee replacement, 30 tablets with 0 refills, currently still 20 tablets in the bottle.  Patient unsure if she is currently taking this medicine.     Past Medical History:  Diagnosis Date  . Anxiety   . Arthritis   . CAD (coronary artery disease)    a. 03/2013 NSTEMI/Cath: 100 RCA, EF 45%->Med Rx;  b. 12/2013 Cath/PCI: LAD 85m, 30d, D1 80, D2 60, LCX nl, RCA 50ost/p, 74m, 95d (2.5x33 Xience DES);  c. 05/2014 Lexi MV: EF 68%, no ischemia; d. stress echo 12/2015 no ischemia @ max exercise (did not achieve target)  . Carotid disease, bilateral ([place])    a. 08/2013 Carotid U/S: 1-39% bilat ICA stenosis.  . Chronic kidney disease    Chronic kidney infections. Takes daily preventative.  . Complication of anesthesia   . CVA (cerebral vascular accident) ([place])    a. 08/2013.  Marland Kitchen CVA (cerebral vascular accident) ([place])   . Decreased libido   . Depression   . GERD  (gastroesophageal reflux disease)   . History of 2019 novel coronavirus disease (COVID-19) 07/2019  . History of recurrent UTIs   . Hypercholesteremia   . Hyperlipemia   . Hypertension   . Insomnia   . Ischemic cardiomyopathy    a. 03/2013 Echo: EF 30-35%, mod dil LA, mod-sev MR, mod TR;  b. 08/2013 Echo EF 60-65%, mild AI.  Marland Kitchen Menopause   . Migraines   . Myocardial infarction ([place])   . PONV (postoperative nausea and vomiting)    If anesthsia is given slowly, pt will not throw up, if given quickly, pt will have nausea and vomiting.  . Right lower quadrant pain   . Syncope and collapse   . Tobacco abuse   . Vaginal atrophy      Patient Active Problem List   Diagnosis Date Noted  . Pelvic pain-right lower quadrant 07/14/2018  . Right lower quadrant abdominal pain 06/30/2018  . Bilateral carotid artery stenosis 03/12/2018  . Chronic systolic CHF (congestive heart failure) ([place]) 03/12/2018  . Status post total knee replacement using cement, left 10/06/2016  . History of coronary artery stent placement   . Pure hypercholesterolemia   . Recurrent UTI 02/11/2016  . Menopause 02/11/2016  . Angina pectoris ([place]) 01/07/2016  . Syncope 07/11/2015  . Orthostatic hypotension 02/19/2015  . Dehydration 02/19/2015  . Hypokalemia 02/19/2015  . Stroke ([place]) 02/18/2015  . Tobacco abuse 02/18/2015  . History of stroke 07/18/2014  .  Vertigo 07/18/2014  . Bilateral wrist pain 07/18/2014  . Atypical chest pain 06/05/2014  . Coronary artery disease of native artery of native heart with stable angina pectoris ([place]) 01/19/2014  . Carotid arterial disease ([place]) 01/19/2014  . Depression 01/16/2014  . Memory loss 01/16/2014  . Right sided weakness 08/10/2013  . Essential hypertension 08/10/2013  . Mixed hyperlipidemia 08/10/2013  . History of MI (myocardial infarction) 08/10/2013  . Stroke, acute, embolic ([place]) 36/64/4034     Past Surgical History:  Procedure Laterality Date  . CARDIAC  CATHETERIZATION  01/01/2014  . CARDIAC CATHETERIZATION  03/16/2013  . CARDIAC CATHETERIZATION  12/2013  . CARDIAC CATHETERIZATION N/A 08/19/2016   Procedure: Left Heart Cath and Coronary Angiography;  Surgeon: Minna Merritts, MD;  Location: [facility];  Service: Cardiovascular;  Laterality: N/A;  . CESAREAN SECTION WITH BILATERAL TUBAL LIGATION    . CORONARY ANGIOPLASTY WITH STENT PLACEMENT  01/01/2014   95% lesion with a drug eluting stent to the distal RCA.  Marland Kitchen ENDOMETRIAL ABLATION    . KNEE ARTHROSCOPY  11/27/2006   left knee   . OOPHORECTOMY    . TOTAL KNEE ARTHROPLASTY Left 10/06/2016   Procedure: TOTAL KNEE ARTHROPLASTY;  Surgeon: Corky Mull, MD;  Location: [facility] ORS;  Service: Orthopedics;  Laterality: Left;  . TOTAL KNEE ARTHROPLASTY Right 11/12/2020   Procedure: TOTAL KNEE ARTHROPLASTY;  Surgeon: Corky Mull, MD;  Location: [facility] ORS;  Service: Orthopedics;  Laterality: Right;  . TUBAL LIGATION       Prior to Admission medications   Medication Sig Start Date End Date Taking? Authorizing Provider  acetaminophen (TYLENOL) 500 MG tablet Take 500 mg by mouth every 6 (six) hours as needed for mild pain or moderate pain.     [provider]  albuterol (VENTOLIN HFA) 108 (90 Base) MCG/ACT inhaler INHALE 2 PUFFS INTO THE LUNGS EVERY 6 HOURS AS NEEDED FOR WHEEZING OR SHORTNESS OF BREATH 02/05/20   Minna Merritts, MD  Ascorbic Acid (VITAMIN C PO) Take 1 tablet by mouth daily.    [provider]  aspirin EC 81 MG tablet Take 1 tablet (81 mg total) by mouth daily. Swallow whole. 01/27/21   Minna Merritts, MD  buPROPion (WELLBUTRIN SR) 150 MG 12 hr tablet Take 150 mg by mouth 2 (two) times daily. (0800 & 1500)    [provider]  butalbital-acetaminophen-caffeine (FIORICET) 50-325-40 MG tablet Take 1-2 tablets by mouth every 6 (six) hours as needed for headache. 05/18/20 05/18/21  Johnn Hai, PA-C  Cholecalciferol (VITAMIN D PO) Take 1 capsule by  mouth daily.    [provider]  FLUoxetine (PROZAC) 40 MG capsule Take 40 mg by mouth daily. 08/11/20   [provider]  furosemide (LASIX) 20 MG tablet TAKE 1 TABLET(20 MG) BY MOUTH DAILY AS NEEDED FOR FLUID RETENTION OR SWELLING 07/29/20   Minna Merritts, MD  hydrOXYzine (ATARAX/VISTARIL) 25 MG tablet Take 25 mg by mouth daily as needed for anxiety. 01/27/16   [provider]  metoprolol succinate (TOPROL-XL) 50 MG 24 hr tablet Take 1 tablet (50 mg total) by mouth daily. Take with or immediately following a meal. 01/27/21   Gollan, Kathlene November, MD  nitroGLYCERIN (NITROSTAT) 0.4 MG SL tablet DISSOLVE 1 TABLET UNDER THE TONGUE EVERY 5 MINUTES AS DIRECTED AS NEEDED FOR CHEST PAIN 03/04/20   Minna Merritts, MD  oxyCODONE (ROXICODONE) 5 MG immediate release tablet Take 1-2 tablets (5-10 mg total) by mouth every  4 (four) hours as needed for moderate pain or severe pain. 11/12/20   Poggi, Marshall Cork, MD  rosuvastatin (CRESTOR) 20 MG tablet Take 20 mg by mouth every evening.    [provider]  triamterene-hydrochlorothiazide (DYAZIDE) 37.5-25 MG capsule Take 1 capsule by mouth daily. 01/10/16   [provider]  zolpidem (AMBIEN) 10 MG tablet Take 10 mg by mouth at bedtime as needed for sleep.    [provider]     Allergies Codeine sulfate and Penicillins   Family History  Problem Relation Age of Onset  . Hypertension Mother   . Stroke Mother   . Hyperlipidemia Mother   . Breast cancer Paternal Grandmother   . Colon cancer Neg Hx   . Ovarian cancer Neg Hx   . Heart disease Neg Hx   . Diabetes Neg Hx     Social History Social History   Tobacco Use  . Smoking status: Former Smoker    Packs/day: 0.25    Years: 20.00    Pack years: 5.00    Types: Cigarettes    Quit date: 04/07/2013    Years since quitting: 8.0  . Smokeless tobacco: Never Used  Vaping Use  . Vaping Use: Never used  Substance Use Topics  . Alcohol use: Yes    Comment: 1  glass of wine a month  . Drug use: No    Review of Systems  Constitutional:   No fever or chills.  ENT:   No sore throat. No rhinorrhea. Cardiovascular:   No chest pain or syncope. Respiratory:   No dyspnea or cough. Gastrointestinal:   Negative for abdominal pain, vomiting and diarrhea.  Musculoskeletal:   Negative for focal pain or swelling All other systems reviewed and are negative except as documented above in ROS and HPI.  ____________________________________________   PHYSICAL EXAM:  VITAL SIGNS: ED Triage Vitals  Enc Vitals Group     BP 04/09/21 2219 (!) 194/102     Pulse Rate 04/09/21 2219 72     Resp 04/09/21 2219 16     Temp 04/09/21 2219 98.4 F (36.9 C)     Temp Source 04/09/21 2219 Oral     SpO2 04/09/21 2219 96 %     Weight 04/09/21 2214 140 lb (63.5 kg)     Height 04/09/21 2214 5\' 2"  (1.575 m)     Head Circumference --      Peak Flow --      Pain Score 04/09/21 2214 0     Pain Loc --      Pain Edu? --      Excl. in [place]? --     Vital signs reviewed, nursing assessments reviewed.   Constitutional:   Alert and oriented. Non-toxic appearance. Eyes:   Conjunctivae are normal. EOMI. PERRL.  No APD ENT      Head:   Normocephalic and atraumatic.      Nose: Normal      Mouth/Throat:   Normal      Neck:   No meningismus. Full ROM. Hematological/Lymphatic/Immunilogical:   No cervical lymphadenopathy. Cardiovascular:   RRR. Symmetric bilateral radial and DP pulses.  No murmurs. Cap refill less than 2 seconds. Respiratory:   Normal respiratory effort without tachypnea/retractions. Breath sounds are clear and equal bilaterally. No wheezes/rales/rhonchi. Gastrointestinal:   Soft and nontender. Non distended. There is no CVA tenderness.  No rebound, rigidity, or guarding. Genitourinary:   deferred Musculoskeletal:   Normal range of motion in all extremities. No joint  effusions.  No lower extremity tenderness.  No edema. Neurologic:   Normal speech and language.   Motor strength grossly intact. There is mild ataxia of the left arm and left leg.   Skin:    Skin is warm, dry and intact. No rash noted.  No petechiae, purpura, or bullae.  ____________________________________________    LABS (pertinent positives/negatives) (all labs ordered are listed, but only abnormal results are displayed) Labs Reviewed  CBC - Abnormal; Notable for the following components:      Result Value   WBC 11.2 (*)    All other components within normal limits  COMPREHENSIVE METABOLIC PANEL - Abnormal; Notable for the following components:   Glucose, Bld 114 (*)    All other components within normal limits  CBG MONITORING, ED - Abnormal; Notable for the following components:   Glucose-Capillary 117 (*)    All other components within normal limits  RESP PANEL BY RT-PCR (FLU A&B, COVID) ARPGX2  PROTIME-INR  APTT  DIFFERENTIAL  HEPARIN LEVEL (UNFRACTIONATED)   ____________________________________________   EKG  Interpreted by me Normal sinus rhythm rate of 71, normal axis and intervals.  Normal QRS ST segments and T waves.  No ischemic changes.  ____________________________________________    RADIOLOGY  CT HEAD WO CONTRAST  Result Date: 04/09/2021 CLINICAL DATA:  TIA. EXAM: CT HEAD WITHOUT CONTRAST TECHNIQUE: Contiguous axial images were obtained from the base of the skull through the vertex without intravenous contrast. COMPARISON:  05/18/2020 FINDINGS: Brain: There is hemorrhage within the right thalamus measuring 1.8 x 1.2 x 1.3 cm. Surrounding vasogenic edema. 5 mm of associated right to left midline shift. No hydrocephalus. Old left basal ganglia lacunar infarct. Chronic small vessel disease throughout the deep white matter. Vascular: No hyperdense vessel or unexpected calcification. Skull: No acute calvarial abnormality. Sinuses/Orbits: Visualized paranasal sinuses and mastoids clear. Orbital soft tissues unremarkable. Other: None IMPRESSION: Acute right  thalamic hemorrhage. 5 mm of right to left midline shift. Old left basal ganglia lacunar infarct. Critical Value/emergent results were called by telephone at the time of interpretation on 04/09/2021 at 10:38 pm to provider [facility] , who verbally acknowledged these results. Electronically Signed   By: Rolm Baptise M.D.   On: 04/09/2021 22:40    ____________________________________________   PROCEDURES .Critical Care Performed by: Carrie Mew, MD Authorized by: Carrie Mew, MD   Critical care provider statement:    Critical care time (minutes):  35   Critical care time was exclusive of:  Separately billable procedures and treating other patients   Critical care was necessary to treat or prevent imminent or life-threatening deterioration of the following conditions:  CNS failure or compromise and circulatory failure   Critical care was time spent personally by me on the following activities:  Development of treatment plan with patient or surrogate, discussions with consultants, evaluation of patient's response to treatment, examination of patient, obtaining history from patient or surrogate, ordering and performing treatments and interventions, ordering and review of laboratory studies, ordering and review of radiographic studies, pulse oximetry, re-evaluation of patient's condition and review of old charts    ____________________________________________  DIFFERENTIAL DIAGNOSIS   Ischemic stroke, intracranial hemorrhage, intoxication  CLINICAL IMPRESSION / ASSESSMENT AND PLAN / ED COURSE  Medications ordered in the ED: Medications  nicardipine (CARDENE) 20mg  in 0.86% saline 236ml IV infusion (0.1 mg/ml) (has no administration in time range)  sodium chloride flush (NS) 0.9 % injection 3 mL (3 mLs Intravenous Given 04/09/21 2249)  labetalol (NORMODYNE) injection 20 mg (  20 mg Intravenous Given 04/09/21 2241)    Pertinent labs & imaging results that were available during my care  of the patient were reviewed by me and considered in my medical decision making (see chart for details).  [name] was evaluated in Emergency Department on 04/09/2021 for the symptoms described in the history of present illness. She was evaluated in the context of the global COVID-19 pandemic, which necessitated consideration that the patient might be at risk for infection with the SARS-CoV-2 virus that causes COVID-19. Institutional protocols and algorithms that pertain to the evaluation of patients at risk for COVID-19 are in a state of rapid change based on information released by regulatory bodies including the CDC and federal and state organizations. These policies and algorithms were followed during the patient's care in the ED.     Clinical Course as of 04/09/21 2358  Wed Apr 09, 2021  2320 Pt p/w dizziness and ataxia falling to the left since waking up this morning.  Found to have a right thalamic hemorrhage with 5 mm of midline shift.  Severely hypertensive.  IV labetalol given, will start nicardipine titration.  Not neurosurgical per Dr. Cari Caraway.  [facility] contacted for neurology transfer. [PS]  2349 D/w neuro Dr. Cheral Marker, recommends repeat CT head at 6 hours to ensure stability. Can admit to [facility] ICU for BP control, goal SBP 100 - 140 on cardene drip. [PS]    Clinical Course User Index [PS] Carrie Mew, MD     ____________________________________________   FINAL CLINICAL IMPRESSION(S) / ED DIAGNOSES    Final diagnoses:  Nontraumatic thalamic hemorrhage ([place])  Uncontrolled hypertension     ED Discharge Orders    None      Portions of this note were generated with dragon dictation software. Dictation errors may occur despite best attempts at proofreading.   Carrie Mew, MD 04/09/21 2358

## 2021-04-09 NOTE — ED Notes (Signed)
Pt reports unsure of last time she felt "normal," initially told triage RN it was yesterday evening however states she woke up "normal" sometime this morning. Pt unsure of her medications, Dr. Joni Fears at bedside looking through medications brought with her to ED and found eliquis. Pt unsure if she is currently taking this or when it was last taken. Pt reports hx of stroke in the past with mild left sided vision deficits, denies other residual deficits. Reports today her left leg has been feeling weak, states she has fallen "probably like 5 times." reports she has hit her head with the falls.

## 2021-04-10 ENCOUNTER — Encounter: Payer: Self-pay | Admitting: Internal Medicine

## 2021-04-10 ENCOUNTER — Inpatient Hospital Stay (HOSPITAL_COMMUNITY)
Admission: AD | Admit: 2021-04-10 | Discharge: 2021-04-16 | DRG: 065 | Disposition: A | Discharge status: Skilled Nursing Facility | Payer: PPO | Source: Other Acute Inpatient Hospital | Attending: Family Medicine | Admitting: Family Medicine

## 2021-04-10 ENCOUNTER — Inpatient Hospital Stay (HOSPITAL_COMMUNITY): Payer: PPO

## 2021-04-10 ENCOUNTER — Inpatient Hospital Stay: Payer: PPO

## 2021-04-10 ENCOUNTER — Inpatient Hospital Stay (HOSPITAL_COMMUNITY)
Admit: 2021-04-10 | Discharge: 2021-04-10 | Disposition: A | Discharge status: Home / Self Care | Payer: PPO | Attending: Nurse Practitioner | Admitting: Nurse Practitioner

## 2021-04-10 DIAGNOSIS — G459 Transient cerebral ischemic attack, unspecified: Secondary | ICD-10-CM | POA: Diagnosis not present

## 2021-04-10 DIAGNOSIS — E785 Hyperlipidemia, unspecified: Secondary | ICD-10-CM | POA: Diagnosis present

## 2021-04-10 DIAGNOSIS — I610 Nontraumatic intracerebral hemorrhage in hemisphere, subcortical: Secondary | ICD-10-CM | POA: Diagnosis not present

## 2021-04-10 DIAGNOSIS — I161 Hypertensive emergency: Secondary | ICD-10-CM | POA: Diagnosis present

## 2021-04-10 DIAGNOSIS — Z955 Presence of coronary angioplasty implant and graft: Secondary | ICD-10-CM

## 2021-04-10 DIAGNOSIS — M6281 Muscle weakness (generalized): Secondary | ICD-10-CM | POA: Diagnosis not present

## 2021-04-10 DIAGNOSIS — Z880 Allergy status to penicillin: Secondary | ICD-10-CM

## 2021-04-10 DIAGNOSIS — E876 Hypokalemia: Secondary | ICD-10-CM | POA: Diagnosis present

## 2021-04-10 DIAGNOSIS — I255 Ischemic cardiomyopathy: Secondary | ICD-10-CM | POA: Diagnosis present

## 2021-04-10 DIAGNOSIS — I2510 Atherosclerotic heart disease of native coronary artery without angina pectoris: Secondary | ICD-10-CM | POA: Diagnosis present

## 2021-04-10 DIAGNOSIS — F17210 Nicotine dependence, cigarettes, uncomplicated: Secondary | ICD-10-CM | POA: Diagnosis present

## 2021-04-10 DIAGNOSIS — R29702 NIHSS score 2: Secondary | ICD-10-CM | POA: Diagnosis present

## 2021-04-10 DIAGNOSIS — M2550 Pain in unspecified joint: Secondary | ICD-10-CM | POA: Diagnosis not present

## 2021-04-10 DIAGNOSIS — I130 Hypertensive heart and chronic kidney disease with heart failure and stage 1 through stage 4 chronic kidney disease, or unspecified chronic kidney disease: Secondary | ICD-10-CM | POA: Diagnosis present

## 2021-04-10 DIAGNOSIS — R471 Dysarthria and anarthria: Secondary | ICD-10-CM | POA: Diagnosis present

## 2021-04-10 DIAGNOSIS — R29810 Facial weakness: Secondary | ICD-10-CM | POA: Diagnosis present

## 2021-04-10 DIAGNOSIS — R2681 Unsteadiness on feet: Secondary | ICD-10-CM | POA: Diagnosis not present

## 2021-04-10 DIAGNOSIS — R41841 Cognitive communication deficit: Secondary | ICD-10-CM | POA: Diagnosis not present

## 2021-04-10 DIAGNOSIS — I619 Nontraumatic intracerebral hemorrhage, unspecified: Secondary | ICD-10-CM

## 2021-04-10 DIAGNOSIS — Z823 Family history of stroke: Secondary | ICD-10-CM | POA: Diagnosis not present

## 2021-04-10 DIAGNOSIS — I6622 Occlusion and stenosis of left posterior cerebral artery: Secondary | ICD-10-CM | POA: Diagnosis not present

## 2021-04-10 DIAGNOSIS — E7800 Pure hypercholesterolemia, unspecified: Secondary | ICD-10-CM | POA: Diagnosis present

## 2021-04-10 DIAGNOSIS — I618 Other nontraumatic intracerebral hemorrhage: Principal | ICD-10-CM | POA: Diagnosis present

## 2021-04-10 DIAGNOSIS — I6389 Other cerebral infarction: Secondary | ICD-10-CM | POA: Diagnosis not present

## 2021-04-10 DIAGNOSIS — I779 Disorder of arteries and arterioles, unspecified: Secondary | ICD-10-CM | POA: Diagnosis present

## 2021-04-10 DIAGNOSIS — Z8249 Family history of ischemic heart disease and other diseases of the circulatory system: Secondary | ICD-10-CM

## 2021-04-10 DIAGNOSIS — Z20822 Contact with and (suspected) exposure to covid-19: Secondary | ICD-10-CM | POA: Diagnosis present

## 2021-04-10 DIAGNOSIS — I10 Essential (primary) hypertension: Secondary | ICD-10-CM | POA: Diagnosis not present

## 2021-04-10 DIAGNOSIS — E782 Mixed hyperlipidemia: Secondary | ICD-10-CM | POA: Diagnosis present

## 2021-04-10 DIAGNOSIS — Z736 Limitation of activities due to disability: Secondary | ICD-10-CM | POA: Diagnosis not present

## 2021-04-10 DIAGNOSIS — Z87440 Personal history of urinary (tract) infections: Secondary | ICD-10-CM | POA: Diagnosis not present

## 2021-04-10 DIAGNOSIS — N1831 Chronic kidney disease, stage 3a: Secondary | ICD-10-CM | POA: Diagnosis not present

## 2021-04-10 DIAGNOSIS — M1990 Unspecified osteoarthritis, unspecified site: Secondary | ICD-10-CM | POA: Diagnosis not present

## 2021-04-10 DIAGNOSIS — I6900 Unspecified sequelae of nontraumatic subarachnoid hemorrhage: Secondary | ICD-10-CM | POA: Diagnosis not present

## 2021-04-10 DIAGNOSIS — F32A Depression, unspecified: Secondary | ICD-10-CM | POA: Diagnosis present

## 2021-04-10 DIAGNOSIS — F419 Anxiety disorder, unspecified: Secondary | ICD-10-CM | POA: Diagnosis present

## 2021-04-10 DIAGNOSIS — R1310 Dysphagia, unspecified: Secondary | ICD-10-CM | POA: Diagnosis present

## 2021-04-10 DIAGNOSIS — R29703 NIHSS score 3: Secondary | ICD-10-CM | POA: Diagnosis present

## 2021-04-10 DIAGNOSIS — Z7901 Long term (current) use of anticoagulants: Secondary | ICD-10-CM | POA: Diagnosis not present

## 2021-04-10 DIAGNOSIS — G8194 Hemiplegia, unspecified affecting left nondominant side: Secondary | ICD-10-CM | POA: Diagnosis present

## 2021-04-10 DIAGNOSIS — Z96653 Presence of artificial knee joint, bilateral: Secondary | ICD-10-CM | POA: Diagnosis not present

## 2021-04-10 DIAGNOSIS — Z803 Family history of malignant neoplasm of breast: Secondary | ICD-10-CM

## 2021-04-10 DIAGNOSIS — I25118 Atherosclerotic heart disease of native coronary artery with other forms of angina pectoris: Secondary | ICD-10-CM | POA: Diagnosis not present

## 2021-04-10 DIAGNOSIS — R531 Weakness: Secondary | ICD-10-CM | POA: Diagnosis not present

## 2021-04-10 DIAGNOSIS — I6523 Occlusion and stenosis of bilateral carotid arteries: Secondary | ICD-10-CM | POA: Diagnosis not present

## 2021-04-10 DIAGNOSIS — Z8673 Personal history of transient ischemic attack (TIA), and cerebral infarction without residual deficits: Secondary | ICD-10-CM | POA: Diagnosis not present

## 2021-04-10 DIAGNOSIS — I252 Old myocardial infarction: Secondary | ICD-10-CM

## 2021-04-10 DIAGNOSIS — Z8616 Personal history of COVID-19: Secondary | ICD-10-CM | POA: Diagnosis not present

## 2021-04-10 DIAGNOSIS — Z885 Allergy status to narcotic agent status: Secondary | ICD-10-CM

## 2021-04-10 DIAGNOSIS — I110 Hypertensive heart disease with heart failure: Secondary | ICD-10-CM | POA: Diagnosis present

## 2021-04-10 DIAGNOSIS — I69122 Dysarthria following nontraumatic intracerebral hemorrhage: Secondary | ICD-10-CM | POA: Diagnosis not present

## 2021-04-10 DIAGNOSIS — I5022 Chronic systolic (congestive) heart failure: Secondary | ICD-10-CM | POA: Diagnosis present

## 2021-04-10 DIAGNOSIS — F329 Major depressive disorder, single episode, unspecified: Secondary | ICD-10-CM | POA: Diagnosis not present

## 2021-04-10 DIAGNOSIS — Z7401 Bed confinement status: Secondary | ICD-10-CM | POA: Diagnosis not present

## 2021-04-10 DIAGNOSIS — R270 Ataxia, unspecified: Secondary | ICD-10-CM | POA: Diagnosis present

## 2021-04-10 DIAGNOSIS — K219 Gastro-esophageal reflux disease without esophagitis: Secondary | ICD-10-CM | POA: Diagnosis present

## 2021-04-10 DIAGNOSIS — I629 Nontraumatic intracranial hemorrhage, unspecified: Secondary | ICD-10-CM | POA: Diagnosis not present

## 2021-04-10 DIAGNOSIS — G4700 Insomnia, unspecified: Secondary | ICD-10-CM | POA: Diagnosis present

## 2021-04-10 DIAGNOSIS — Z83438 Family history of other disorder of lipoprotein metabolism and other lipidemia: Secondary | ICD-10-CM

## 2021-04-10 LAB — BASIC METABOLIC PANEL
Anion gap: 10 (ref 5–15)
BUN: 23 mg/dL (ref 8–23)
CO2: 22 mmol/L (ref 22–32)
Calcium: 9.1 mg/dL (ref 8.9–10.3)
Chloride: 109 mmol/L (ref 98–111)
Creatinine, Ser: 0.94 mg/dL (ref 0.44–1.00)
GFR, Estimated: >60 mL/min (ref >60)
Glucose, Bld: 127 mg/dL — ABNORMAL HIGH (ref 70–99)
Potassium: 3.5 mmol/L (ref 3.5–5.1)
Sodium: 141 mmol/L (ref 135–145)

## 2021-04-10 LAB — CBC
HCT: 38 % (ref 36.0–46.0)
HCT: 36.3 % (ref 36.0–46.0)
Hemoglobin: 13.1 g/dL (ref 12.0–15.0)
Hemoglobin: 12.1 g/dL (ref 12.0–15.0)
MCH: 30.8 pg (ref 26.0–34.0)
MCH: 30.3 pg (ref 26.0–34.0)
MCHC: 34.5 g/dL (ref 30.0–36.0)
MCHC: 33.3 g/dL (ref 30.0–36.0)
MCV: 89.2 fL (ref 80.0–100.0)
MCV: 91 fL (ref 80.0–100.0)
Platelets: 288 10*3/uL (ref 150–400)
Platelets: 277 10*3/uL (ref 150–400)
RBC: 4.26 MIL/uL (ref 3.87–5.11)
RBC: 3.99 MIL/uL (ref 3.87–5.11)
RDW: 13.9 % (ref 11.5–15.5)
RDW: 13.6 % (ref 11.5–15.5)
WBC: 13.6 10*3/uL — ABNORMAL HIGH (ref 4.0–10.5)
WBC: 10.4 10*3/uL (ref 4.0–10.5)
nRBC: 0 % (ref 0.0–0.2)
nRBC: 0 % (ref 0.0–0.2)

## 2021-04-10 LAB — GLUCOSE, CAPILLARY
Glucose-Capillary: 142 mg/dL — ABNORMAL HIGH (ref 70–99)
Glucose-Capillary: 101 mg/dL — ABNORMAL HIGH (ref 70–99)

## 2021-04-10 LAB — ECHOCARDIOGRAM COMPLETE
AR max vel: 1.74 cm2
AV Area VTI: 1.61 cm2
AV Area mean vel: 1.75 cm2
AV Mean grad: 5 mmHg
AV Peak grad: 11.3 mmHg
Ao pk vel: 1.68 m/s
Area-P 1/2: 3.6 cm2
Height: 62 in
MV VTI: 1.89 cm2
S' Lateral: 3.1 cm
Weight: 2338.64 oz

## 2021-04-10 LAB — LIPID PANEL
Cholesterol: 283 mg/dL — ABNORMAL HIGH (ref 0–200)
HDL: 42 mg/dL (ref >40)
LDL Cholesterol: 177 mg/dL — ABNORMAL HIGH (ref 0–99)
Total CHOL/HDL Ratio: 6.7 RATIO
Triglycerides: 320 mg/dL — ABNORMAL HIGH (ref <150)
VLDL: 64 mg/dL — ABNORMAL HIGH (ref 0–40)

## 2021-04-10 LAB — MAGNESIUM: Magnesium: 1.9 mg/dL (ref 1.7–2.4)

## 2021-04-10 LAB — COMPREHENSIVE METABOLIC PANEL
ALT: 19 U/L (ref 0–44)
AST: 26 U/L (ref 15–41)
Albumin: 3.4 g/dL — ABNORMAL LOW (ref 3.5–5.0)
Alkaline Phosphatase: 82 U/L (ref 38–126)
Anion gap: 8 (ref 5–15)
BUN: 15 mg/dL (ref 8–23)
CO2: 24 mmol/L (ref 22–32)
Calcium: 9 mg/dL (ref 8.9–10.3)
Chloride: 105 mmol/L (ref 98–111)
Creatinine, Ser: 0.9 mg/dL (ref 0.44–1.00)
GFR, Estimated: >60 mL/min (ref >60)
Glucose, Bld: 114 mg/dL — ABNORMAL HIGH (ref 70–99)
Potassium: 3.4 mmol/L — ABNORMAL LOW (ref 3.5–5.1)
Sodium: 137 mmol/L (ref 135–145)
Total Bilirubin: 1.2 mg/dL (ref 0.3–1.2)
Total Protein: 6.5 g/dL (ref 6.5–8.1)

## 2021-04-10 LAB — RESP PANEL BY RT-PCR (FLU A&B, COVID) ARPGX2
Influenza A by PCR: NEGATIVE
Influenza B by PCR: NEGATIVE
SARS Coronavirus 2 by RT PCR: NEGATIVE

## 2021-04-10 LAB — HIV ANTIBODY (ROUTINE TESTING W REFLEX): HIV Screen 4th Generation wRfx: NONREACTIVE

## 2021-04-10 LAB — MRSA PCR SCREENING: MRSA by PCR: NEGATIVE

## 2021-04-10 LAB — HEPARIN LEVEL (UNFRACTIONATED): Heparin Unfractionated: <0.1 IU/mL — ABNORMAL LOW (ref 0.30–0.70)

## 2021-04-10 MED ORDER — CLEVIDIPINE BUTYRATE 0.5 MG/ML IV EMUL: 0.0000 mg/h | INTRAVENOUS | Status: DC

## 2021-04-10 MED ORDER — ACETAMINOPHEN 160 MG/5ML PO SOLN
650.0000 mg | ORAL | Status: DC | PRN
  Filled 2021-04-10: qty 20.3

## 2021-04-10 MED ORDER — ACETAMINOPHEN 325 MG PO TABS
650.0000 mg | ORAL_TABLET | ORAL | Status: DC | PRN
  Filled 2021-04-10: qty 2

## 2021-04-10 MED ORDER — CHLORHEXIDINE GLUCONATE CLOTH 2 % EX PADS
6.0000 | MEDICATED_PAD | Freq: Every day | CUTANEOUS | Status: DC
  Administered 2021-04-10: 6 via TOPICAL

## 2021-04-10 MED ORDER — STROKE: EARLY STAGES OF RECOVERY BOOK: Freq: Once | Status: DC

## 2021-04-10 MED ORDER — ACETAMINOPHEN 160 MG/5ML PO SOLN: 650.0000 mg | ORAL | Status: DC | PRN

## 2021-04-10 MED ORDER — POLYETHYLENE GLYCOL 3350 17 G PO PACK: 17.0000 g | PACK | Freq: Every day | ORAL | Status: DC | PRN

## 2021-04-10 MED ORDER — SENNOSIDES-DOCUSATE SODIUM 8.6-50 MG PO TABS
1.0000 | ORAL_TABLET | Freq: Two times a day (BID) | ORAL | Status: DC
  Administered 2021-04-10 (×2): 1 via ORAL
  Filled 2021-04-10 (×2): qty 1

## 2021-04-10 MED ORDER — ACETAMINOPHEN 650 MG RE SUPP: 650.0000 mg | RECTAL | Status: DC | PRN

## 2021-04-10 MED ORDER — SENNOSIDES-DOCUSATE SODIUM 8.6-50 MG PO TABS
1.0000 | ORAL_TABLET | Freq: Two times a day (BID) | ORAL | Status: DC
  Administered 2021-04-11 – 2021-04-16 (×11): 1 via ORAL
  Filled 2021-04-10 (×12): qty 1

## 2021-04-10 MED ORDER — AMLODIPINE BESYLATE 10 MG PO TABS
10.0000 mg | ORAL_TABLET | Freq: Every day | ORAL | Status: DC
  Administered 2021-04-10: 10 mg via ORAL
  Filled 2021-04-10: qty 1

## 2021-04-10 MED ORDER — DOCUSATE SODIUM 100 MG PO CAPS: 100.0000 mg | ORAL_CAPSULE | Freq: Two times a day (BID) | ORAL | Status: DC | PRN

## 2021-04-10 MED ORDER — AMLODIPINE BESYLATE 10 MG PO TABS: 10.0000 mg | ORAL_TABLET | Freq: Every day | ORAL | Status: DC

## 2021-04-10 MED ORDER — PANTOPRAZOLE SODIUM 40 MG IV SOLR
40.0000 mg | Freq: Every day | INTRAVENOUS | Status: DC
  Administered 2021-04-10: 40 mg via INTRAVENOUS
  Filled 2021-04-10: qty 40

## 2021-04-10 MED ORDER — ACETAMINOPHEN 325 MG PO TABS: 650.0000 mg | ORAL_TABLET | ORAL | Status: DC | PRN

## 2021-04-10 MED ORDER — PANTOPRAZOLE SODIUM 40 MG PO TBEC: 40.0000 mg | DELAYED_RELEASE_TABLET | Freq: Every day | ORAL | Status: DC

## 2021-04-10 MED ORDER — STROKE: EARLY STAGES OF RECOVERY BOOK
Freq: Once | Status: AC
  Filled 2021-04-10: qty 1

## 2021-04-10 NOTE — ED Notes (Signed)
Pharmacy tech at bedside discussing pt's medications. Pt denies any change in symptoms.

## 2021-04-10 NOTE — Progress Notes (Signed)
Report called to [facility] on [facility].  PT agreeable to transfer.  [facility] also given report.  Belongings, including her cell phone and charger sent with her to [facility]. Pt gave her rings to her neighbor "Juanda Crumble" or "Mo" as mentioned in note earlier today. Son Camillia Herter contacted by me to inform him of the patient's transfer to [facility]

## 2021-04-10 NOTE — ED Notes (Signed)
New light blue top sent to lab d/t lab stating prior clotted and was unable to get heparin level.

## 2021-04-10 NOTE — Progress Notes (Signed)
OT Cancellation Note  Patient Details Name: [name] MRN: [id number] DOB: 01-27-1956   Cancelled Treatment:    Reason Eval/Treat Not Completed: Other (comment). Pt noted with plans to discharge to [facility] Neuro ICU for additional care. Will complete current OT order.   Hanley Hays, MPH, MS, OTR/L ascom [phone] 04/10/21, 1:24 PM

## 2021-04-10 NOTE — Discharge Summary (Signed)
Physician Discharge Summary  Patient ID: ENRIQUE WEISS MRN: [id number] DOB/AGE: 17-Oct-1956 65 y.o.  Admit date: 04/09/2021 Discharge date: 04/10/2021  Admission Diagnoses: ACUTE ICH  Discharge Diagnoses:  Active Problems:   Thalamic hemorrhage [facility])   Discharged Condition: STABLE  Hospital Course:  65 year old female with significant past medical history as below presenting to the ED on 04/10/2019 with chief complaints of left sided weakness and feeling off balance.  Patient reports that she was in her usual state of health last night Ambien 10 mg and woke up that morning at around 4:30am with a left-sided weakness, dizziness, difficulty with balance and falling to the left whenever she attempts to ambulate throughout the day.  She denies other associated symptoms of nausea vomiting, numbness and tingling, vision disturbances, speech difficulty or any other focal neurological deficit.  Patient's son at the bedside states that she does not know what medication she takes but believes she has history of hypertension and prior strokes.  Of note she takes Eliquis but did not take this medication last night or the previous night.  She is unsure of what medication she takes although has multiple pill bottles in her plastic bag.  ED Course: On arrival to the ED, she was afebrile and was noted  with elevated  blood pressure 194/102 mm Hg and pulse rate 70 beats/min. She has mild left facial weakness, left arm and leg ataxia otherwise no obvious focal neurological deficits; she was alert and oriented x4, and he did not demonstrate any memory deficits.  Initial labs revealed WBC 11.2 otherwise unremarkable CBC and CMP.  Noncontrast CT head was obtained and showed acute right thalamic hemorrhage.  5 mm of right-to-left midline shift.  Case was discussed with neurosurgery on-call by EDP who recommended no surgical intervention.  EDP also discussed case with [facility] neurologist Dr. Cheral Marker who recommended  repeat CT head in 6 hours to ensure stability and admitted to [facility] ICU for BP control with goal SBP 100-1 40 on nicardipine drip.  PCCM consulted for further management.  Initial NIHSS: 3 (LUE ataxia, mild left facial weakness, LUE Drift)  Past Medical History  CAD (coronary artery disease a. 03/2013 NSTEMI/Cath: 100 RCA, EF 45%->Med Rx;  b. 12/2013 Cath/PCI: LAD 72m, 30d, D1 80, D2 60, LCX nl, RCA 50ost/p, 21m, 95d (2.5x33 Xience DES);  c. 05/2014 Lexi MV: EF 68%, no ischemia; d. stress echo 12/2015 no ischemia @ max exercise (did not achieve target) Chronic kidney disease            Chronic kidney infections. Takes daily preventative. CVA (cerebral vascular accident) ([place]) a. 08/2013.              Depression and Anxiety            GERD (gastroesophageal reflux disease)        History of 2019 novel coronavirus disease (COVID-19)        07/2019 History of recurrent UTIs          Hyperlipemia     Hypertension     Insomnia           Ischemic cardiomyopathy. a. 03/2013 Echo: EF 30-35%, mod dil LA, mod-sev MR, mod TR;  b. 08/2013 Echo EF 60-65%, mild AI.     Migraines          Myocardial infarction (HCC)     PONV (postoperative nausea and vomiting)    If anesthsia is given slowly, pt will not throw up, if given quickly,  pt will have nausea and vomiting.           Syncope and collapse   Tobacco abuse                        Bilateral carotid artery stenosis           Chronic systolic CHF (congestive heart failure) (HCC)          Status post total knee replacement using cement, left  Significant Hospital Events   04/09/2021: Admit to ICU  Consults:  Neurosurgery Neurology  Procedures:  None  Significant Diagnostic Tests:   CT Head Wo Contrast Result Date: 04/09/2021 IMPRESSION: Acute right thalamic hemorrhage. 5 mm of right to left midline shift.   Micro Data:  04/09/2021: SARS-CoV-2 PCR>> negative 04/09/2021: Influenza PCR>> negative  Antimicrobials:  None    ASSESSMENT  AND PLAN Hemorrhagic stroke: Acute right thalamic hemorrhage with right to left midline shift likely due to uncontrolled hypertension Initial NIH stroke scale: Stroke risk factors: Hypertension, HLD, prior strokes, ischemic cardiomyopathy, smoking - Vital signs/blood pressure with neuro checks/NIHSS  per  protocol for the first 24 hours  - Keep BP <140/100 with Nicardipine Gtt  + PRN Labetalol - Obtain MRI/MRA brain without contrast per stroke protocol - Obtain TTE to r/o cardioembolic source, assess EF  - Order Carotid dopplers - Check fasting Lipid Panel with Direct LDL in AM.  - Check HgA1c, TSH  - Will repeat head CT in 6 hours from initial imaging per Neurologist Dr. Yvetta Coder recommendations - Hold antiplatelet and anticoagulation in the setting of acute hemorrhage with high risk for hemorrhagic conversion. Place SCDs for DVT prevention. - NPO for now  - PT consult, OT consult, Speech consult - Aspiration precautions and Seizure Precautions - Obtain STAT head CT for any new acute headache or new neurological deficits - Neurology consult   Hypertensive Emergency with target end organ damage  PMHx: HTN Uncontrolled home meds:  Metoprolol, triamterene -Hydrochlorothiazide, SBP goal < 140 due to risk of hemorrhagic conversion Long-term BP goal normotensive   Hyperlipidemia Home meds: Crestor LDL  goal < 70 - Resume Crestor once able to tolerate po   Coronary Artery Disease 03/2013 NSTEMI/Cath: 100 RCA, EF 45%->Med Rx; b. 12/2013 Cath/PCI: LAD 7m, 30d, D1 80, D2 60, LCX nl, RCA 50ost/p, 66m, 95d (2.5x33 Xience DES PMHx: MI, Ischemic cardiomyopathy - Hold Aspirin  - HTN, HLD, DM control as above - Encourage smoking cessation   Chronic systolic CHF (last known EF 60 to 65%) No evidence of volume overload - Continuous cardiac monitoring - Hold Lasix and BP meds for now - Repeat 2D Echocardiogram   Anxiety and depression - We will continue home, hydroxyzine,  Prozac and Wellbutrin once able to tolerate p.o.   Best practice:  Diet: NPO Pain/Anxiety/Delirium protocol (if indicated): No VAP protocol (if indicated): Not indicated DVT prophylaxis: Contraindicated GI prophylaxis: PPI Glucose control: SSI No Central venous access: N/A Arterial line: N/A Foley: N/A Mobility: bed rest PT consulted: Yes Last date of multidisciplinary goals of care discussion[04/09/2021] Code Status: full code Disposition:ICU  Discharge Exam: Blood pressure 116/71, pulse 64, temperature 98.1 F (36.7 C), temperature source Temporal, resp. rate 20, height 5\' 2"  (1.575 m), weight 66.3 kg, SpO2 98 %.   Disposition: NEURO ICU   Allergies as of 04/10/2021      Reactions   Codeine Sulfate    sick   Penicillins Nausea And Vomiting   Has patient had  a PCN reaction causing immediate rash, facial/tongue/throat swelling, SOB or lightheadedness with hypotension: No Has patient had a PCN reaction causing severe rash involving mucus membranes or skin necrosis: No Has patient had a PCN reaction that required hospitalization No Has patient had a PCN reaction occurring within the last 10 years: No If all of the above answers are "NO", then may proceed with Cephalosporin use.      Medication List    STOP taking these medications   acetaminophen 500 MG tablet Commonly known as: TYLENOL   albuterol 108 (90 Base) MCG/ACT inhaler Commonly known as: VENTOLIN HFA   apixaban 2.5 MG Tabs tablet Commonly known as: ELIQUIS   aspirin EC 81 MG tablet   buPROPion 150 MG 12 hr tablet Commonly known as: WELLBUTRIN SR   butalbital-acetaminophen-caffeine 50-325-40 MG tablet Commonly known as: FIORICET   ciprofloxacin 250 MG tablet Commonly known as: CIPRO   FLUoxetine 40 MG capsule Commonly known as: PROZAC   furosemide 20 MG tablet Commonly known as: LASIX   HYDROcodone-acetaminophen 5-325 MG tablet Commonly known as: NORCO/VICODIN   hydrOXYzine 25 MG  tablet Commonly known as: ATARAX/VISTARIL   metoprolol succinate 50 MG 24 hr tablet Commonly known as: TOPROL-XL   nitroGLYCERIN 0.4 MG SL tablet Commonly known as: NITROSTAT   ondansetron 4 MG disintegrating tablet Commonly known as: ZOFRAN-ODT   oxyCODONE 5 MG immediate release tablet Commonly known as: Roxicodone   rosuvastatin 20 MG tablet Commonly known as: CRESTOR   triamterene-hydrochlorothiazide 37.5-25 MG capsule Commonly known as: DYAZIDE   VITAMIN C PO   VITAMIN D PO   zolpidem 10 MG tablet Commonly known as: AMBIEN     TAKE these medications   amLODipine 10 MG tablet Commonly known as: NORVASC Take 1 tablet (10 mg total) by mouth daily. Start taking on: April 11, 2021   pantoprazole 40 MG tablet Commonly known as: PROTONIX Take 1 tablet (40 mg total) by mouth at bedtime. What changed: when to take this       CASE DISCUSSED WITH DR ASHISH Rory Percy   Signed: Flora Lipps 04/10/2021, 12:51 PM

## 2021-04-10 NOTE — ED Notes (Signed)
Pt had one episode of emesis, Josh, RN notified d/t pt being transferred up to ICU at this time. Pt denies any change in symptoms, remains alert, denies headaches. VSS. MRI tech came to bedside following emesis, reports she did not feel comfortable bringing pt to MRI at this time d/t having to lay pt flat.

## 2021-04-10 NOTE — Progress Notes (Signed)
SLP Cancellation Note  Patient Details Name: [name] MRN: [id number] DOB: 10/04/1956   Cancelled treatment:       Reason Eval/Treat Not Completed: Fatigue/lethargy limiting ability to participate  Pt is able to minimally arouse even with maximal cues. Will defer cognitive linguistic evaluation until next available date for better assessment of abilities.   Pt's nurse reports MRI scheduled for 1:30pm today. Will monitor chart for results.   [name] B. Rutherford Nail M.S., CCC-SLP, [place] [facility] [phone]  Stormy Fabian 04/10/2021, 10:36 AM

## 2021-04-10 NOTE — Progress Notes (Signed)
PHARMACIST - PHYSICIAN COMMUNICATION  CONCERNING: IV to Oral Route Change Policy  RECOMMENDATION: This patient is receiving pantoprazole by the intravenous route.  Based on criteria approved by the Pharmacy and Therapeutics Committee, the intravenous medication(s) is/are being converted to the equivalent oral dose form(s).   DESCRIPTION: These criteria include:  The patient is eating (either orally or via tube) and/or has been taking other orally administered medications for a least 24 hours  The patient has no evidence of active gastrointestinal bleeding or impaired GI absorption (gastrectomy, short bowel, patient on TNA or NPO).  If you have questions about this conversion, please contact the [place], [facility] 04/10/2021 10:53 AM

## 2021-04-10 NOTE — H&P (Addendum)
NAME:  [name], MRN:  [id number], DOB:  01/29/1956, LOS: 0 ADMISSION DATE:  04/09/2021, CONSULTATION DATE: 04/10/2021 REFERRING MD: Brenton Grills, MD, CHIEF COMPLAINT: Back   History of present illness   65 year old female with significant past medical history as below presenting to the ED on 04/10/2019 with chief complaints of left sided weakness and feeling off balance.  Patient reports that she was in her usual state of health last night Ambien 10 mg and woke up that morning at around 4:30am with a left-sided weakness, dizziness, difficulty with balance and falling to the left whenever she attempts to ambulate throughout the day.  She denies other associated symptoms of nausea vomiting, numbness and tingling, vision disturbances, speech difficulty or any other focal neurological deficit.  Patient's son at the bedside states that she does not know what medication she takes but believes she has history of hypertension and prior strokes.  Of note she takes Eliquis but did not take this medication last night or the previous night.  She is unsure of what medication she takes although has multiple pill bottles in her plastic bag.  ED Course: On arrival to the ED, she was afebrile and was noted  with elevated  blood pressure 194/102 mm Hg and pulse rate 70 beats/min. She has mild left facial weakness, left arm and leg ataxia otherwise no obvious focal neurological deficits; she was alert and oriented x4, and he did not demonstrate any memory deficits.  Initial labs revealed WBC 11.2 otherwise unremarkable CBC and CMP.  Noncontrast CT head was obtained and showed acute right thalamic hemorrhage.  5 mm of right-to-left midline shift.  Case was discussed with neurosurgery on-call by EDP who recommended no surgical intervention.  EDP also discussed case with [facility] neurologist Dr. Cheral Marker who recommended repeat CT head in 6 hours to ensure stability and admitted to [facility] ICU for BP control with goal SBP  100-1 40 on nicardipine drip.  PCCM consulted for further management.  Initial NIHSS: 3 (LUE ataxia, mild left facial weakness, LUE Drift)  Past Medical History  CAD (coronary artery disease a. 03/2013 NSTEMI/Cath: 100 RCA, EF 45%->Med Rx;  b. 12/2013 Cath/PCI: LAD 17m, 30d, D1 80, D2 60, LCX nl, RCA 50ost/p, 23m, 95d (2.5x33 Xience DES);  c. 05/2014 Lexi MV: EF 68%, no ischemia; d. stress echo 12/2015 no ischemia @ max exercise (did not achieve target) Chronic kidney disease   Chronic kidney infections. Takes daily preventative. CVA (cerebral vascular accident) ([place]) a. 08/2013.   Depression and Anxiety   GERD (gastroesophageal reflux disease)   History of 2019 novel coronavirus disease (COVID-19) 07/2019 History of recurrent UTIs   Hyperlipemia   Hypertension   Insomnia   Ischemic cardiomyopathy. a. 03/2013 Echo: EF 30-35%, mod dil LA, mod-sev MR, mod TR;  b. 08/2013 Echo EF 60-65%, mild AI.   Migraines   Myocardial infarction (HCC)   PONV (postoperative nausea and vomiting)   If anesthsia is given slowly, pt will not throw up, if given quickly, pt will have nausea and vomiting.   Syncope and collapse   Tobacco abuse    Bilateral carotid artery stenosis  Chronic systolic CHF (congestive heart failure) (HCC)  Status post total knee replacement using cement, left  Significant Hospital Events   04/09/2021: Admit to ICU  Consults:  Neurosurgery Neurology  Procedures:  None  Significant Diagnostic Tests:   CT Head Wo Contrast Result Date: 04/09/2021 IMPRESSION: Acute right thalamic hemorrhage. 5 mm of right to left  midline shift.   Micro Data:  04/09/2021: SARS-CoV-2 PCR>> negative 04/09/2021: Influenza PCR>> negative  Antimicrobials:  None  OBJECTIVE  Blood pressure 124/68, pulse 84, temperature 98.4 F (36.9 C), temperature source Oral, resp. rate 16, height 5\' 2"  (1.575 m), weight 63.5 kg, SpO2 94 %.       No intake or output data in the 24 hours ending 04/10/21 0121 Filed  Weights   04/09/21 2214  Weight: 63.5 kg     Physical Examination  GENERAL:65 year-old patient lying in the bed with no acute distress.  EYES: Pupils equal, round, reactive to light and accommodation. No scleral icterus. Extraocular muscles intact.  HEENT: Head atraumatic, normocephalic. Oropharynx and nasopharynx clear.  NECK:  Supple, no jugular venous distention. No thyroid enlargement, no tenderness.  LUNGS: Normal breath sounds bilaterally, no wheezing, rales,rhonchi or crepitation. No use of accessory muscles of respiration.  CARDIOVASCULAR: S1, S2 normal. No murmurs, rubs, or gallops.  ABDOMEN: Soft, nontender, nondistended. Bowel sounds present. No organomegaly or mass.  EXTREMITIES: No pedal edema, cyanosis, or clubbing.  NEUROLOGIC: Cranial nerves II through XII are intact. Except mild left facial weakness. Muscle strength 5/5 in all extremities except drift noted in the LUE. LUE Ataxia. Sensation intact. Gait not checked.  PSYCHIATRIC: The patient is alert and oriented x 3.  SKIN: No obvious rash, lesion, or ulcer.   Labs/imaging that I havepersonally reviewed  (right click and "Reselect all SmartList Selections" daily)   EKG: normal EKG, normal sinus rhythm, unchanged from previous tracings, normal sinus rhythm.  Labs   CBC: Recent Labs  Lab 04/09/21 2218  WBC 11.2*  NEUTROABS 7.7  HGB 13.1  HCT 38.5  MCV 90.2  PLT 268    Basic Metabolic Panel: Recent Labs  Lab 04/09/21 2218  NA 140  K 3.7  CL 108  CO2 22  GLUCOSE 114*  BUN 18  CREATININE 0.84  CALCIUM 9.3   GFR: Estimated Creatinine Clearance: 60.1 mL/min (by C-G formula based on SCr of 0.84 mg/dL). Recent Labs  Lab 04/09/21 2218  WBC 11.2*    Liver Function Tests: Recent Labs  Lab 04/09/21 2218  AST 24  ALT 18  ALKPHOS 91  BILITOT 0.7  PROT 7.7  ALBUMIN 3.9   No results for input(s): LIPASE, AMYLASE in the last 168 hours. No results for input(s): AMMONIA in the last 168  hours.  ABG    Component Value Date/Time   TCO2 27 08/10/2013 1025     Coagulation Profile: Recent Labs  Lab 04/09/21 2218  INR 1.0    Cardiac Enzymes: No results for input(s): CKTOTAL, CKMB, CKMBINDEX, TROPONINI in the last 168 hours.  HbA1C: Hemoglobin A1C  Date/Time Value Ref Range Status  05/14/2014 11:53 AM 5.8 4.2 - 6.3 % Final    Comment:    The American Diabetes Association recommends that a primary goal of therapy should be <7% and that physicians should reevaluate the treatment regimen in patients with HbA1c values consistently >8%.   12/31/2013 06:45 AM 6.4 (H) 4.2 - 6.3 % Final    Comment:    The American Diabetes Association recommends that a primary goal of therapy should be <7% and that physicians should reevaluate the treatment regimen in patients with HbA1c values consistently >8%.    Hgb A1c MFr Bld  Date/Time Value Ref Range Status  02/18/2015 04:06 AM 6.0 (H) 4.8 - 5.6 % Final    Comment:    (NOTE)  Pre-diabetes: 5.7 - 6.4         Diabetes: >6.4         Glycemic control for adults with diabetes: <7.0     CBG: Recent Labs  Lab 04/09/21 2251  GLUCAP 117*    Review of Systems:   UNABLE TO OBTAIN  PATIENT IS A POOR HISTORIAN WITH UNDERLYING MEMORY LOSS   Past Medical History  She,  has a past medical history of Anxiety, Arthritis, CAD (coronary artery disease), Carotid disease, bilateral ([place]), Chronic kidney disease, Complication of anesthesia, CVA (cerebral vascular accident) ([place]), CVA (cerebral vascular accident) ([place]), Decreased libido, Depression, GERD (gastroesophageal reflux disease), History of 2019 novel coronavirus disease (COVID-19) (07/2019), History of recurrent UTIs, Hypercholesteremia, Hyperlipemia, Hypertension, Insomnia, Ischemic cardiomyopathy, Menopause, Migraines, Myocardial infarction ([place]), PONV (postoperative nausea and vomiting), Right lower quadrant pain, Syncope and collapse, Tobacco abuse, and Vaginal atrophy.    Surgical History    Past Surgical History:  Procedure Laterality Date  . CARDIAC CATHETERIZATION  01/01/2014  . CARDIAC CATHETERIZATION  03/16/2013  . CARDIAC CATHETERIZATION  12/2013  . CARDIAC CATHETERIZATION N/A 08/19/2016   Procedure: Left Heart Cath and Coronary Angiography;  Surgeon: Minna Merritts, MD;  Location: [place] [facility];  Service: Cardiovascular;  Laterality: N/A;  . CESAREAN SECTION WITH BILATERAL TUBAL LIGATION    . CORONARY ANGIOPLASTY WITH STENT PLACEMENT  01/01/2014   95% lesion with a drug eluting stent to the distal RCA.  Marland Kitchen ENDOMETRIAL ABLATION    . KNEE ARTHROSCOPY  11/27/2006   left knee   . OOPHORECTOMY    . TOTAL KNEE ARTHROPLASTY Left 10/06/2016   Procedure: TOTAL KNEE ARTHROPLASTY;  Surgeon: Corky Mull, MD;  Location: [facility] ORS;  Service: Orthopedics;  Laterality: Left;  . TOTAL KNEE ARTHROPLASTY Right 11/12/2020   Procedure: TOTAL KNEE ARTHROPLASTY;  Surgeon: Corky Mull, MD;  Location: [facility] ORS;  Service: Orthopedics;  Laterality: Right;  . TUBAL LIGATION       Social History   reports that she quit smoking about 8 years ago. Her smoking use included cigarettes. She has a 5.00 pack-year smoking history. She has never used smokeless tobacco. She reports current alcohol use. She reports that she does not use drugs.   Family History   Her family history includes Breast cancer in her paternal grandmother; Hyperlipidemia in her mother; Hypertension in her mother; Stroke in her mother. There is no history of Colon cancer, Ovarian cancer, Heart disease, or Diabetes.   Allergies Allergies  Allergen Reactions  . Codeine Sulfate     sick  . Penicillins Nausea And Vomiting    Has patient had a PCN reaction causing immediate rash, facial/tongue/throat swelling, SOB or lightheadedness with hypotension: No Has patient had a PCN reaction causing severe rash involving mucus membranes or skin necrosis: No Has patient had a PCN reaction that required  hospitalization No Has patient had a PCN reaction occurring within the last 10 years: No If all of the above answers are "NO", then may proceed with Cephalosporin use.      Home Medications  Prior to Admission medications   Medication Sig Start Date End Date Taking? Authorizing Provider  albuterol (VENTOLIN HFA) 108 (90 Base) MCG/ACT inhaler INHALE 2 PUFFS INTO THE LUNGS EVERY 6 HOURS AS NEEDED FOR WHEEZING OR SHORTNESS OF BREATH 02/05/20  Yes Gollan, Kathlene November, MD  apixaban (ELIQUIS) 2.5 MG TABS tablet Take 2.5 mg by mouth 2 (two) times daily.   Yes [provider]  aspirin  EC 81 MG tablet Take 1 tablet (81 mg total) by mouth daily. Swallow whole. 01/27/21  Yes Gollan, Kathlene November, MD  FLUoxetine (PROZAC) 40 MG capsule Take 40 mg by mouth daily. 08/11/20  Yes [provider]  furosemide (LASIX) 20 MG tablet TAKE 1 TABLET(20 MG) BY MOUTH DAILY AS NEEDED FOR FLUID RETENTION OR SWELLING 07/29/20  Yes Gollan, Kathlene November, MD  nitroGLYCERIN (NITROSTAT) 0.4 MG SL tablet DISSOLVE 1 TABLET UNDER THE TONGUE EVERY 5 MINUTES AS DIRECTED AS NEEDED FOR CHEST PAIN 03/04/20  Yes Gollan, Kathlene November, MD  zolpidem (AMBIEN) 10 MG tablet Take 10 mg by mouth at bedtime as needed for sleep.   Yes [provider]  acetaminophen (TYLENOL) 500 MG tablet Take 500 mg by mouth every 6 (six) hours as needed for mild pain or moderate pain.     [provider]  Ascorbic Acid (VITAMIN C PO) Take 1 tablet by mouth daily.    [provider]  buPROPion (WELLBUTRIN SR) 150 MG 12 hr tablet Take 150 mg by mouth 2 (two) times daily. (0800 & 1500)    [provider]  butalbital-acetaminophen-caffeine (FIORICET) 50-325-40 MG tablet Take 1-2 tablets by mouth every 6 (six) hours as needed for headache. 05/18/20 05/18/21  Johnn Hai, PA-C  Cholecalciferol (VITAMIN D PO) Take 1 capsule by mouth daily.    [provider]  ciprofloxacin (CIPRO) 250 MG tablet Take 250 mg by mouth 2  (two) times daily. 02/17/21   [provider]  hydrOXYzine (ATARAX/VISTARIL) 25 MG tablet Take 25 mg by mouth daily as needed for anxiety. 01/27/16   [provider]  metoprolol succinate (TOPROL-XL) 50 MG 24 hr tablet Take 1 tablet (50 mg total) by mouth daily. Take with or immediately following a meal. 01/27/21   Gollan, Kathlene November, MD  oxyCODONE (ROXICODONE) 5 MG immediate release tablet Take 1-2 tablets (5-10 mg total) by mouth every 4 (four) hours as needed for moderate pain or severe pain. 11/12/20   Poggi, Marshall Cork, MD  pantoprazole (PROTONIX) 40 MG tablet Take 1 tablet by mouth 2 (two) times daily. 02/17/21   [provider]  rosuvastatin (CRESTOR) 20 MG tablet Take 20 mg by mouth every evening.    [provider]  triamterene-hydrochlorothiazide (DYAZIDE) 37.5-25 MG capsule Take 1 capsule by mouth daily. 01/10/16   [provider]    Scheduled Meds: .  stroke: mapping our early stages of recovery book   Does not apply Once  . pantoprazole (PROTONIX) IV  40 mg Intravenous QHS  . senna-docusate  1 tablet Oral BID   Continuous Infusions: . niCARDipine 15 mg/hr (04/10/21 0138)   PRN Meds:.acetaminophen **OR** acetaminophen (TYLENOL) oral liquid 160 mg/5 mL **OR** acetaminophen, docusate sodium, polyethylene glycol   Active Hospital Problem list   Acute right thalamic hemorrhage Hypertensive emergency HLD CAD CHF Anxiety and depression  Assessment & Plan:   Hemorrhagic stroke: Acute right thalamic hemorrhage with right to left midline shift likely due to uncontrolled hypertension Initial NIH stroke scale: Stroke risk factors: Hypertension, HLD, prior strokes, ischemic cardiomyopathy, smoking  - Admit to ICU with critical care service following - Vital signs/blood pressure with neuro checks/NIHSS  per  protocol for the first 24 hours  - Keep BP <140/100 with Nicardipine Gtt  + PRN Labetalol - Obtain MRI/MRA brain without contrast per stroke  protocol - Obtain TTE to r/o cardioembolic source, assess EF  - Order Carotid dopplers - Check fasting Lipid Panel with Direct  LDL in AM.  - Check HgA1c, TSH  - Will repeat head CT in 6 hours from initial imaging per Neurologist Dr. Yvetta Coder recommendations - Hold antiplatelet and anticoagulation in the setting of acute hemorrhage with high risk for hemorrhagic conversion. Place SCDs for DVT prevention. - NPO for now  - PT consult, OT consult, Speech consult - Aspiration precautions and Seizure Precautions - Obtain STAT head CT for any new acute headache or new neurological deficits - Neurology consult   Hypertensive Emergency with target end organ damage  PMHx: HTN Uncontrolled home meds:   Metoprolol, triamterene -Hydrochlorothiazide, SBP goal < 140 due to risk of hemorrhagic conversion Long-term BP goal normotensive   Hyperlipidemia Home meds: Crestor LDL  goal < 70 - Resume Crestor once able to tolerate po   Coronary Artery Disease 03/2013 NSTEMI/Cath: 100 RCA, EF 45%->Med Rx;  b. 12/2013 Cath/PCI: LAD 57m, 30d, D1 80, D2 60, LCX nl, RCA 50ost/p, 25m, 95d (2.5x33 Xience DES PMHx: MI, Ischemic cardiomyopathy - Hold Aspirin  - HTN, HLD, DM control as above - Encourage smoking cessation    Chronic systolic CHF (last known EF 60 to 65%) No evidence of volume overload - Continuous cardiac monitoring - Hold Lasix and BP meds for now - Repeat 2D Echocardiogram    Anxiety and depression - We will continue home, hydroxyzine, Prozac and Wellbutrin once able to tolerate p.o.   Best practice:  Diet:  NPO Pain/Anxiety/Delirium protocol (if indicated): No VAP protocol (if indicated): Not indicated DVT prophylaxis: Contraindicated GI prophylaxis: PPI Glucose control:  SSI No Central venous access:  N/A Arterial line:  N/A Foley:  N/A Mobility:  bed rest  PT consulted: Yes Last date of multidisciplinary goals of care discussion [04/09/2021] Code Status:  full  code Disposition: ICU  Critical care time: [place], FNP-C, AGACNP-BC Acute Care Nurse Practitioner  [place] Pager: [phone] [place] [facility]  .

## 2021-04-10 NOTE — ED Notes (Signed)
Per Merrily Pew, RN in CCU pt to be brought to CT prior to transfer to unit d/t change in status. Pt remains alert and oriented, denies ongoing nausea, denies headache, states "that just came out of nowhere."

## 2021-04-10 NOTE — Progress Notes (Signed)
OT Cancellation Note  Patient Details Name: [name] MRN: [id number] DOB: [birth year]   Cancelled Treatment:    Reason Eval/Treat Not Completed: Medical issues which prohibited therapy. Consult received, chart reviewed. Pt noted with pending imaging and neurology consult for acute hemorrhagic CVA, currently on nicardipine gtt for BP control and maintaining a systolic BP between 416-384, per chart. Will hold OT eval and re-attempt at later date/time as medically appropriate and BP well controlled and pending imaging to ensure stability and neurology consult to inform/update plan of care.   Hanley Hays, MPH, MS, OTR/L ascom [phone] 04/10/21, 8:21 AM

## 2021-04-10 NOTE — H&P (Addendum)
Admission H&P    Chief Complaint: Left sided weakness  HPI: [name] is an 65 y.o. female who presents in transfer from [facility] for management of an acute right thalamic ICH.   HPI from initial [facility] ICU admission has been reviewed: "65 year old female with significant past medical history as below presenting to the ED on 04/10/2019 with chief complaints of left sided weakness and feeling off balance. Patient reports that she was in her usual state of health last night Ambien 10 mg and woke up that morning at around 4:30am with a left-sided weakness, dizziness, difficulty with balance and falling to the left whenever she attempts to ambulate throughout the day.  She denies other associated symptoms of nausea vomiting, numbness and tingling, vision disturbances, speech difficulty or any other focal neurological deficit.  Patient's son at the bedside states that she does not know what medication she takes but believes she has history of hypertension and prior strokes.  Of note she takes Eliquis but did not take this medication last night or the previous night.  She is unsure of what medication she takes although has multiple pill bottles in her plastic bag. ED Course: On arrival to the ED, she was afebrile and was noted  with elevated  blood pressure 194/102 mm Hg and pulse rate 70 beats/min. She has mild left facial weakness, left arm and leg ataxia otherwise no obvious focal neurological deficits; she was alert and oriented x4, and he did not demonstrate any memory deficits.  Initial labs revealed WBC 11.2 otherwise unremarkable CBC and CMP.  Noncontrast CT head was obtained and showed acute right thalamic hemorrhage.  5 mm of right-to-left midline shift.  Case was discussed with neurosurgery on-call by EDP who recommended no surgical intervention.  EDP also discussed case with [facility] neurologist Dr. Cheral Marker who recommended repeat CT head in 6 hours to ensure stability and admitted to [facility] ICU for BP  control with goal SBP 100-1 40 on nicardipine drip.  PCCM consulted for further management."   Past Medical History:  Diagnosis Date  . Anxiety   . Arthritis   . CAD (coronary artery disease)    a. 03/2013 NSTEMI/Cath: 100 RCA, EF 45%->Med Rx;  b. 12/2013 Cath/PCI: LAD 57m, 30d, D1 80, D2 60, LCX nl, RCA 50ost/p, 56m, 95d (2.5x33 Xience DES);  c. 05/2014 Lexi MV: EF 68%, no ischemia; d. stress echo 12/2015 no ischemia @ max exercise (did not achieve target)  . Carotid disease, bilateral ([place])    a. 08/2013 Carotid U/S: 1-39% bilat ICA stenosis.  . Chronic kidney disease    Chronic kidney infections. Takes daily preventative.  . Complication of anesthesia   . CVA (cerebral vascular accident) ([place])    a. 08/2013.  Marland Kitchen CVA (cerebral vascular accident) ([place])   . Decreased libido   . Depression   . GERD (gastroesophageal reflux disease)   . History of 2019 novel coronavirus disease (COVID-19) 07/2019  . History of recurrent UTIs   . Hypercholesteremia   . Hyperlipemia   . Hypertension   . Insomnia   . Ischemic cardiomyopathy    a. 03/2013 Echo: EF 30-35%, mod dil LA, mod-sev MR, mod TR;  b. 08/2013 Echo EF 60-65%, mild AI.  Marland Kitchen Menopause   . Migraines   . Myocardial infarction ([place])   . PONV (postoperative nausea and vomiting)    If anesthsia is given slowly, pt will not throw up, if given quickly, pt will have nausea and vomiting.  Marland Kitchen  Right lower quadrant pain   . Syncope and collapse   . Tobacco abuse   . Vaginal atrophy     Past Surgical History:  Procedure Laterality Date  . CARDIAC CATHETERIZATION  01/01/2014  . CARDIAC CATHETERIZATION  03/16/2013  . CARDIAC CATHETERIZATION  12/2013  . CARDIAC CATHETERIZATION N/A 08/19/2016   Procedure: Left Heart Cath and Coronary Angiography;  Surgeon: Minna Merritts, MD;  Location: [facility];  Service: Cardiovascular;  Laterality: N/A;  . CESAREAN SECTION WITH BILATERAL TUBAL LIGATION    . CORONARY ANGIOPLASTY WITH STENT PLACEMENT   01/01/2014   95% lesion with a drug eluting stent to the distal RCA.  Marland Kitchen ENDOMETRIAL ABLATION    . KNEE ARTHROSCOPY  11/27/2006   left knee   . OOPHORECTOMY    . TOTAL KNEE ARTHROPLASTY Left 10/06/2016   Procedure: TOTAL KNEE ARTHROPLASTY;  Surgeon: Corky Mull, MD;  Location: [facility] ORS;  Service: Orthopedics;  Laterality: Left;  . TOTAL KNEE ARTHROPLASTY Right 11/12/2020   Procedure: TOTAL KNEE ARTHROPLASTY;  Surgeon: Corky Mull, MD;  Location: [facility] ORS;  Service: Orthopedics;  Laterality: Right;  . TUBAL LIGATION      Family History  Problem Relation Age of Onset  . Hypertension Mother   . Stroke Mother   . Hyperlipidemia Mother   . Breast cancer Paternal Grandmother   . Colon cancer Neg Hx   . Ovarian cancer Neg Hx   . Heart disease Neg Hx   . Diabetes Neg Hx    Social History:  reports that she has been smoking cigarettes. She has a 30.00 pack-year smoking history. She has never used smokeless tobacco. She reports current alcohol use. She reports that she does not use drugs.  Allergies:  Allergies  Allergen Reactions  . Codeine Sulfate     sick  . Penicillins Nausea And Vomiting    Has patient had a PCN reaction causing immediate rash, facial/tongue/throat swelling, SOB or lightheadedness with hypotension: No Has patient had a PCN reaction causing severe rash involving mucus membranes or skin necrosis: No Has patient had a PCN reaction that required hospitalization No Has patient had a PCN reaction occurring within the last 10 years: No If all of the above answers are "NO", then may proceed with Cephalosporin use.     Medications Prior to Admission  Medication Sig Dispense Refill  . [START ON 04/11/2021] amLODipine (NORVASC) 10 MG tablet Take 1 tablet (10 mg total) by mouth daily.    . pantoprazole (PROTONIX) 40 MG tablet Take 1 tablet (40 mg total) by mouth at bedtime.      ROS: The patient does endorse any complaints. In the context of poor orientation and poor  insight, review of systems is limited.   Physical Examination: Blood pressure 136/78, pulse 78, temperature (!) 97.5 F (36.4 C), temperature source Oral, resp. rate 17, SpO2 92 %.  HEENT-  [place]/AT  Heart: RRR Lungs - Respirations unlabored Abdomen: Nondistended Extremities - No edema  Neurologic Examination: Mental Status: Drowsy, but with intact attention. Speech is sparse, but fluent and nondysarthric. Naming for common objects intact. Able to follow all commands. Oriented to the city and the state, but not the day, month or year. Knows she is in the hospital with an intracranial hemorrhage. Mild confusion. Mildly increased latencies of verbal and motor responses.  Cranial Nerves: II:  Temporal visual fields intact with no extinction to DSS. PERRL.  III,IV, VI: EOMI with saccadic pursuits to the right and  smooth pursuits to the left.  V,VII: Temp sensation equal bilaterally. Mild left facial weakness, lower quadrant.  VIII: Hearing intact to voice IX,X: No hypophonia XI: Slight lag on the RIGHT with shoulder shrug XII: midline tongue extension  Motor: RUE 5/5 RLE 5/5 LUE 4+/5 with lag in movement. Left pronator drift also noted LLE with slight lag in movement, 5/5 strength Sensory: Temp and FT intact x 4. No extinction to DSS.  Deep Tendon Reflexes:  3+ right brachioradialis, 2+ left brachioradialis 3+ right patellar, 4+ left patellar 4+ right achilles, 1+ left achilles Cerebellar: Mild ataxia with FNF on the left.  Gait: Deferred  Results for orders placed or performed during the hospital encounter of 04/09/21 (from the past 48 hour(s))  Protime-INR     Status: None   Collection Time: 04/09/21 10:18 PM  Result Value Ref Range   Prothrombin Time 13.4 11.4 - 15.2 seconds   INR 1.0 0.8 - 1.2    Comment: (NOTE) INR goal varies based on device and disease states. Performed at [facility], [address] [place], [place] [address]   APTT     Status: None    Collection Time: 04/09/21 10:18 PM  Result Value Ref Range   aPTT 25 24 - 36 seconds    Comment: Performed at [facility], [address] [place], [place] [address]  CBC     Status: Abnormal   Collection Time: 04/09/21 10:18 PM  Result Value Ref Range   WBC 11.2 (H) 4.0 - 10.5 K/uL   RBC 4.27 3.87 - 5.11 MIL/uL   Hemoglobin 13.1 12.0 - 15.0 g/dL   HCT 38.5 36.0 - 46.0 %   MCV 90.2 80.0 - 100.0 fL   MCH 30.7 26.0 - 34.0 pg   MCHC 34.0 30.0 - 36.0 g/dL   RDW 13.7 11.5 - 15.5 %   Platelets 273 150 - 400 K/uL   nRBC 0.0 0.0 - 0.2 %    Comment: Performed at [facility], [address] [place], [place] [address]  Differential     Status: None   Collection Time: 04/09/21 10:18 PM  Result Value Ref Range   Neutrophils Relative % 68 %   Neutro Abs 7.7 1.7 - 7.7 K/uL   Lymphocytes Relative 19 %   Lymphs Abs 2.2 0.7 - 4.0 K/uL   Monocytes Relative 9 %   Monocytes Absolute 1.0 0.1 - 1.0 K/uL   Eosinophils Relative 3 %   Eosinophils Absolute 0.3 0.0 - 0.5 K/uL   Basophils Relative 0 %   Basophils Absolute 0.1 0.0 - 0.1 K/uL   Immature Granulocytes 1 %   Abs Immature Granulocytes 0.06 0.00 - 0.07 K/uL    Comment: Performed at [facility], [address] [place], [place] [address]  Comprehensive metabolic panel     Status: Abnormal   Collection Time: 04/09/21 10:18 PM  Result Value Ref Range   Sodium 140 135 - 145 mmol/L   Potassium 3.7 3.5 - 5.1 mmol/L   Chloride 108 98 - 111 mmol/L   CO2 22 22 - 32 mmol/L   Glucose, Bld 114 (H) 70 - 99 mg/dL    Comment: Glucose reference range applies only to samples taken after fasting for at least 8 hours.   BUN 18 8 - 23 mg/dL   Creatinine, Ser 0.84 0.44 - 1.00 mg/dL   Calcium 9.3 8.9 - 10.3 mg/dL   Total Protein 7.7 6.5 - 8.1 g/dL   Albumin  3.9 3.5 - 5.0 g/dL   AST 24 15 - 41 U/L   ALT 18 0 - 44 U/L   Alkaline Phosphatase 91 38 - 126 U/L   Total Bilirubin 0.7 0.3 - 1.2 mg/dL   GFR, Estimated >60 >60  mL/min    Comment: (NOTE) Calculated using the CKD-EPI Creatinine Equation (2021)    Anion gap 10 5 - 15    Comment: Performed at [facility], [address] [place], [place] [address]  CBG monitoring, ED     Status: Abnormal   Collection Time: 04/09/21 10:51 PM  Result Value Ref Range   Glucose-Capillary 117 (H) 70 - 99 mg/dL    Comment: Glucose reference range applies only to samples taken after fasting for at least 8 hours.  Resp Panel by RT-PCR (Flu A&B, Covid) Nasopharyngeal Swab     Status: None   Collection Time: 04/09/21 11:54 PM   Specimen: Nasopharyngeal Swab; Nasopharyngeal(NP) swabs in vial transport medium  Result Value Ref Range   SARS Coronavirus 2 by RT PCR NEGATIVE NEGATIVE    Comment: (NOTE) SARS-CoV-2 target nucleic acids are NOT DETECTED.  The SARS-CoV-2 RNA is generally detectable in upper respiratory specimens during the acute phase of infection. The lowest concentration of SARS-CoV-2 viral copies this assay can detect is 138 copies/mL. A negative result does not preclude SARS-Cov-2 infection and should not be used as the sole basis for treatment or other patient management decisions. A negative result may occur with  improper specimen collection/handling, submission of specimen other than nasopharyngeal swab, presence of viral mutation(s) within the areas targeted by this assay, and inadequate number of viral copies(<138 copies/mL). A negative result must be combined with clinical observations, patient history, and epidemiological information. The expected result is Negative.  Fact Sheet for Patients:  EntrepreneurPulse.com.au  Fact Sheet for Healthcare Providers:  IncredibleEmployment.be  This test is no t yet approved or cleared by the [place] FDA and  has been authorized for detection and/or diagnosis of SARS-CoV-2 by FDA under an Emergency Use Authorization (EUA). This EUA will remain  in effect  (meaning this test can be used) for the duration of the COVID-19 declaration under Section 564(b)(1) of the Act, 21 U.S.C.section 360bbb-3(b)(1), unless the authorization is terminated  or revoked sooner.       Influenza A by PCR NEGATIVE NEGATIVE   Influenza B by PCR NEGATIVE NEGATIVE    Comment: (NOTE) The Xpert Xpress SARS-CoV-2/FLU/RSV plus assay is intended as an aid in the diagnosis of influenza from Nasopharyngeal swab specimens and should not be used as a sole basis for treatment. Nasal washings and aspirates are unacceptable for Xpert Xpress SARS-CoV-2/FLU/RSV testing.  Fact Sheet for Patients: EntrepreneurPulse.com.au  Fact Sheet for Healthcare Providers: IncredibleEmployment.be  This test is not yet approved or cleared by the [place] FDA and has been authorized for detection and/or diagnosis of SARS-CoV-2 by FDA under an Emergency Use Authorization (EUA). This EUA will remain in effect (meaning this test can be used) for the duration of the COVID-19 declaration under Section 564(b)(1) of the Act, 21 U.S.C. section 360bbb-3(b)(1), unless the authorization is terminated or revoked.  Performed at [facility], [place], [place] [address]   Heparin level (unfractionated)     Status: Abnormal   Collection Time: 04/10/21  1:32 AM  Result Value Ref Range   Heparin Unfractionated <0.10 (L) 0.30 - 0.70 IU/mL    Comment: (NOTE) The clinical reportable range upper limit is being  lowered to >1.10 to align with the FDA approved guidance for the current laboratory assay.  If heparin results are below expected values, and patient dosage has  been confirmed, suggest follow up testing of antithrombin III levels. Performed at [facility], [address] [place], [place] [address]   Glucose, capillary     Status: Abnormal   Collection Time: 04/10/21  3:08 AM  Result Value Ref Range    Glucose-Capillary 142 (H) 70 - 99 mg/dL    Comment: Glucose reference range applies only to samples taken after fasting for at least 8 hours.  MRSA PCR Screening     Status: None   Collection Time: 04/10/21  3:27 AM   Specimen: Nasopharyngeal  Result Value Ref Range   MRSA by PCR NEGATIVE NEGATIVE    Comment:        The GeneXpert MRSA Assay (FDA approved for NASAL specimens only), is one component of a comprehensive MRSA colonization surveillance program. It is not intended to diagnose MRSA infection nor to guide or monitor treatment for MRSA infections. Performed at [facility], [address] [place], [place] [address]   HIV Antibody (routine testing w rflx)     Status: None   Collection Time: 04/10/21  4:14 AM  Result Value Ref Range   HIV Screen 4th Generation wRfx Non Reactive Non Reactive    Comment: Performed at [facility], [place] [address] [place], [place] [address]  CBC     Status: Abnormal   Collection Time: 04/10/21  4:14 AM  Result Value Ref Range   WBC 13.6 (H) 4.0 - 10.5 K/uL   RBC 4.26 3.87 - 5.11 MIL/uL   Hemoglobin 13.1 12.0 - 15.0 g/dL   HCT 38.0 36.0 - 46.0 %   MCV 89.2 80.0 - 100.0 fL   MCH 30.8 26.0 - 34.0 pg   MCHC 34.5 30.0 - 36.0 g/dL   RDW 13.9 11.5 - 15.5 %   Platelets 288 150 - 400 K/uL   nRBC 0.0 0.0 - 0.2 %    Comment: Performed at [facility], [address], [place], [place] [address]  Basic metabolic panel     Status: Abnormal   Collection Time: 04/10/21  4:14 AM  Result Value Ref Range   Sodium 141 135 - 145 mmol/L   Potassium 3.5 3.5 - 5.1 mmol/L   Chloride 109 98 - 111 mmol/L   CO2 22 22 - 32 mmol/L   Glucose, Bld 127 (H) 70 - 99 mg/dL    Comment: Glucose reference range applies only to samples taken after fasting for at least 8 hours.   BUN 23 8 - 23 mg/dL   Creatinine, Ser 0.94 0.44 - 1.00 mg/dL   Calcium 9.1 8.9 - 10.3 mg/dL   GFR, Estimated >60 >60 mL/min    Comment: (NOTE) Calculated using the  CKD-EPI Creatinine Equation (2021)    Anion gap 10 5 - 15    Comment: Performed at [facility], [address] [place], [place] [address]  Magnesium     Status: None   Collection Time: 04/10/21  4:14 AM  Result Value Ref Range   Magnesium 1.9 1.7 - 2.4 mg/dL    Comment: Performed at [facility], [address] [place],  [address]  Lipid panel     Status: Abnormal   Collection Time: 04/10/21  4:14 AM  Result Value Ref Range   Cholesterol 283 (H) 0 - 200 mg/dL  Triglycerides 320 (H) <150 mg/dL   HDL 42 >40 mg/dL   Total CHOL/HDL Ratio 6.7 RATIO   VLDL 64 (H) 0 - 40 mg/dL   LDL Cholesterol 177 (H) 0 - 99 mg/dL    Comment:        Total Cholesterol/HDL:CHD Risk Coronary Heart Disease Risk Table                     Men   Women  1/2 Average Risk   3.4   3.3  Average Risk       5.0   4.4  2 X Average Risk   9.6   7.1  3 X Average Risk  23.4   11.0        Use the calculated Patient Ratio above and the CHD Risk Table to determine the patient's CHD Risk.        ATP III CLASSIFICATION (LDL):  <100     mg/dL   Optimal  100-129  mg/dL   Near or Above                    Optimal  130-159  mg/dL   Borderline  160-189  mg/dL   High  >190     mg/dL   Very High Performed at [facility], [address] [place], [place] [address]   Glucose, capillary     Status: Abnormal   Collection Time: 04/10/21  3:20 PM  Result Value Ref Range   Glucose-Capillary 101 (H) 70 - 99 mg/dL    Comment: Glucose reference range applies only to samples taken after fasting for at least 8 hours.   CT HEAD WO CONTRAST  Result Date: 04/10/2021 CLINICAL DATA:  Intracranial hemorrhage follow-up EXAM: CT HEAD WITHOUT CONTRAST TECHNIQUE: Contiguous axial images were obtained from the base of the skull through the vertex without intravenous contrast. COMPARISON:  04/09/2021 FINDINGS: Brain: Unchanged size of intraparenchymal hemorrhage in the right thalamus. Trace leftward  midline shift is unchanged. Old left caudate infarct. There is periventricular hypoattenuation compatible with chronic microvascular disease. Vascular: No abnormal hyperdensity of the major intracranial arteries or dural venous sinuses. No intracranial atherosclerosis. Skull: The visualized skull base, calvarium and extracranial soft tissues are normal. Sinuses/Orbits: No fluid levels or advanced mucosal thickening of the visualized paranasal sinuses. No mastoid or middle ear effusion. The orbits are normal. IMPRESSION: Unchanged size of right thalamic intraparenchymal hemorrhage with trace leftward midline shift. Electronically Signed   By: Ulyses Jarred M.D.   On: 04/10/2021 03:12   CT HEAD WO CONTRAST  Result Date: 04/09/2021 CLINICAL DATA:  TIA. EXAM: CT HEAD WITHOUT CONTRAST TECHNIQUE: Contiguous axial images were obtained from the base of the skull through the vertex without intravenous contrast. COMPARISON:  05/18/2020 FINDINGS: Brain: There is hemorrhage within the right thalamus measuring 1.8 x 1.2 x 1.3 cm. Surrounding vasogenic edema. 5 mm of associated right to left midline shift. No hydrocephalus. Old left basal ganglia lacunar infarct. Chronic small vessel disease throughout the deep white matter. Vascular: No hyperdense vessel or unexpected calcification. Skull: No acute calvarial abnormality. Sinuses/Orbits: Visualized paranasal sinuses and mastoids clear. Orbital soft tissues unremarkable. Other: None IMPRESSION: Acute right thalamic hemorrhage. 5 mm of right to left midline shift. Old left basal ganglia lacunar infarct. Critical Value/emergent results were called by telephone at the time of interpretation on 04/09/2021 at 10:38 pm to provider [facility] , who verbally acknowledged these results. Electronically Signed   By: Lennette Bihari  Dover M.D.   On: 04/09/2021 22:40   MR BRAIN WO CONTRAST  Result Date: 04/10/2021 CLINICAL DATA:  Intracranial hemorrhage EXAM: MRI HEAD WITHOUT CONTRAST TECHNIQUE:  Multiplanar, multiecho pulse sequences of the brain and surrounding structures were obtained without intravenous contrast. COMPARISON:  Correlation made with recent CT imaging.  MRI 2016 FINDINGS: Brain: Acute hemorrhage within the right thalamus is again identified with surrounding edema effacing the adjacent third ventricle. There is no intraventricular extension. No hydrocephalus. Chronic infarct of the left basal ganglia and adjacent white matter. Additional patchy and confluent areas of T2 hyperintensity in the supratentorial greater than pontine white matter are nonspecific but probably reflect moderate chronic microvascular ischemic changes. There is a focus of susceptibility in the left frontal subcortical white matter; adjacent linear susceptibility could reflect a developmental venous anomaly and therefore this may represent a cavernous malformation. There is no intracranial mass, noting that there is limited evaluation for underlying right thalamic lesion without contrast. There is no hydrocephalus or extra-axial fluid collection. Vascular: Major vessel flow voids at the skull base are preserved. Skull and upper cervical spine: Normal marrow signal is preserved. Sinuses/Orbits: Paranasal sinuses are aerated. Orbits are unremarkable. Other: Sella is unremarkable.  Mastoid air cells are clear. IMPRESSION: Acute right thalamic hemorrhage with mild mass effect. No intraventricular extension. Moderate chronic microvascular ischemic changes. 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 Electronically Signed   By: Macy Mis M.D.   On: 04/10/2021 14:02   US Carotid Bilateral (at [facility] and [facility] only)  Result Date: 04/10/2021 CLINICAL DATA:  Hyperlipidemia, stroke symptoms EXAM: BILATERAL CAROTID DUPLEX ULTRASOUND TECHNIQUE: Pearline Cables scale imaging, color Doppler and duplex ultrasound were performed of bilateral carotid and vertebral  arteries in the neck. COMPARISON:  None. FINDINGS: Criteria: Quantification of carotid stenosis is based on velocity parameters that correlate the residual internal carotid diameter with NASCET-based stenosis levels, using the diameter of the distal internal carotid lumen as the denominator for stenosis measurement. The following velocity measurements were obtained: RIGHT ICA: 68/17 cm/sec CCA: 37/85 cm/sec SYSTOLIC ICA/CCA RATIO:  0.8 ECA: 90 cm/sec LEFT ICA: 64/10 cm/sec CCA: 88/50 cm/sec SYSTOLIC ICA/CCA RATIO:  0.8 ECA: 113 cm/sec RIGHT CAROTID ARTERY: Minor echogenic shadowing plaque formation. No hemodynamically significant right ICA stenosis, velocity elevation, or turbulent flow. Degree of narrowing less than 50%. RIGHT VERTEBRAL ARTERY:  Normal antegrade flow LEFT CAROTID ARTERY: Similar scattered minor echogenic plaque formation. No hemodynamically significant left ICA stenosis, velocity elevation, or turbulent flow. LEFT VERTEBRAL ARTERY:  Normal antegrade flow IMPRESSION: Bilateral carotid atherosclerosis. No hemodynamically significant ICA stenosis. Degree of narrowing less than 50% bilaterally by ultrasound criteria. Patent antegrade vertebral flow bilaterally Electronically Signed   By: Jerilynn Mages.  Shick M.D.   On: 04/10/2021 14:46   ECHOCARDIOGRAM COMPLETE  Result Date: 04/10/2021    ECHOCARDIOGRAM REPORT   Patient Name:   [name] Date of Exam: 04/10/2021 Medical Rec #:  [id number]     Height:       62.0 in Accession #:    [id number]    Weight:       146.2 lb Date of Birth:  [birth year]     BSA:          1.673 m Patient Age:    74 years      BP:           114/60 mmHg Patient Gender: F  HR:           73 bpm. Exam Location:  [facility] Procedure: 2D Echo, Color Doppler and Cardiac Doppler Indications:     I63.9 Stroke  History:         Patient has prior history of Echocardiogram examinations.                  Stroke; Risk Factors:Current Smoker, Hypertension, Dyslipidemia                  and HCL.   Sonographer:     Charmayne Sheer RDCS (AE) Referring Phys:  [place] Diagnosing Phys: Kathlyn Sacramento MD  Sonographer Comments: Suboptimal apical window. Global longitudinal strain was attempted. IMPRESSIONS  1. Left ventricular ejection fraction, by estimation, is 55 to 60%. The left ventricle has normal function. The left ventricle has no regional wall motion abnormalities. Left ventricular diastolic parameters are consistent with Grade I diastolic dysfunction (impaired relaxation). The global longitudinal strain is normal.  2. Right ventricular systolic function is normal. The right ventricular size is normal. Tricuspid regurgitation signal is inadequate for assessing PA pressure.  3. Trivial pericardial effusion is present. The pericardial effusion is circumferential.  4. The mitral valve is normal in structure. No evidence of mitral valve regurgitation. No evidence of mitral stenosis.  5. The aortic valve is normal in structure. Aortic valve regurgitation is mild. Mild aortic valve sclerosis is present, with no evidence of aortic valve stenosis.  6. The inferior vena cava is normal in size with greater than 50% respiratory variability, suggesting right atrial pressure of 3 mmHg. FINDINGS  Left Ventricle: Left ventricular ejection fraction, by estimation, is 55 to 60%. The left ventricle has normal function. The left ventricle has no regional wall motion abnormalities. The global longitudinal strain is normal. The left ventricular internal cavity size was normal in size. There is no left ventricular hypertrophy. Left ventricular diastolic parameters are consistent with Grade I diastolic dysfunction (impaired relaxation). Right Ventricle: The right ventricular size is normal. No increase in right ventricular wall thickness. Right ventricular systolic function is normal. Tricuspid regurgitation signal is inadequate for assessing PA pressure. Left Atrium: Left atrial size was normal in size. Right  Atrium: Right atrial size was normal in size. Pericardium: Trivial pericardial effusion is present. The pericardial effusion is circumferential. Mitral Valve: The mitral valve is normal in structure. No evidence of mitral valve regurgitation. No evidence of mitral valve stenosis. MV peak gradient, 3.4 mmHg. The mean mitral valve gradient is 1.0 mmHg. Tricuspid Valve: The tricuspid valve is normal in structure. Tricuspid valve regurgitation is not demonstrated. No evidence of tricuspid stenosis. Aortic Valve: The aortic valve is normal in structure. Aortic valve regurgitation is mild. Mild aortic valve sclerosis is present, with no evidence of aortic valve stenosis. Aortic valve mean gradient measures 5.0 mmHg. Aortic valve peak gradient measures 11.3 mmHg. Aortic valve area, by VTI measures 1.61 cm. Pulmonic Valve: The pulmonic valve was normal in structure. Pulmonic valve regurgitation is mild. No evidence of pulmonic stenosis. Aorta: The aortic root is normal in size and structure. Venous: The inferior vena cava is normal in size with greater than 50% respiratory variability, suggesting right atrial pressure of 3 mmHg. IAS/Shunts: No atrial level shunt detected by color flow Doppler.  LEFT VENTRICLE PLAX 2D LVIDd:         4.60 cm  Diastology LVIDs:         3.10 cm  LV e' medial:  6.42 cm/s LV PW:         1.20 cm  LV E/e' medial:  9.2 LV IVS:        0.90 cm  LV e' lateral:   6.20 cm/s LVOT diam:     1.90 cm  LV E/e' lateral: 9.5 LV SV:         47 LV SV Index:   28 LVOT Area:     2.84 cm  RIGHT VENTRICLE RV Basal diam:  2.50 cm LEFT ATRIUM           Index       RIGHT ATRIUM           Index LA diam:      3.40 cm 2.03 cm/m  RA Area:     10.30 cm LA Vol (A2C): 40.5 ml 24.21 ml/m RA Volume:   17.80 ml  10.64 ml/m LA Vol (A4C): 41.2 ml 24.62 ml/m  AORTIC VALVE                    PULMONIC VALVE AV Area (Vmax):    1.74 cm     PV Vmax:       1.26 m/s AV Area (Vmean):   1.75 cm     PV Vmean:      84.600 cm/s AV  Area (VTI):     1.61 cm     PV VTI:        0.275 m AV Vmax:           168.00 cm/s  PV Peak grad:  6.4 mmHg AV Vmean:          101.000 cm/s PV Mean grad:  3.0 mmHg AV VTI:            0.292 m AV Peak Grad:      11.3 mmHg AV Mean Grad:      5.0 mmHg LVOT Vmax:         103.00 cm/s LVOT Vmean:        62.300 cm/s LVOT VTI:          0.166 m LVOT/AV VTI ratio: 0.57  AORTA Ao Root diam: 3.30 cm MITRAL VALVE MV Area (PHT): 3.60 cm    SHUNTS MV Area VTI:   1.89 cm    Systemic VTI:  0.17 m MV Peak grad:  3.4 mmHg    Systemic Diam: 1.90 cm MV Mean grad:  1.0 mmHg MV Vmax:       0.92 m/s MV Vmean:      52.7 cm/s MV Decel Time: 211 msec MV E velocity: 59.10 cm/s MV A velocity: 90.80 cm/s MV E/A ratio:  0.65 Kathlyn Sacramento MD Electronically signed by Kathlyn Sacramento MD Signature Date/Time: 04/10/2021/11:30:41 AM    Final     Assessment: 65 year old female presenting with acute right thalamic hemorrhage 1. Exam reveals left sided weakness, mild left sided ataxia and hyperreflexia. The patient is drowsy, but with intact attention. Speech is sparse, but fluent and nondysarthric. Mild confusion and mildly increased latencies of verbal and motor responses are noted. 2. MRI brain: Acute right thalamic hemorrhage with mild mass effect. No intraventricular extension. Moderate chronic microvascular ischemic changes. 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  3. TTE:  Left ventricular ejection fraction, by estimation, is 55 to 60%. 4. Carotid ultrasound: Bilateral carotid atherosclerosis. No hemodynamically significant ICA stenosis. Degree of narrowing less than 50% bilaterally by ultrasound criteria. Patent  antegrade vertebral flow bilaterally  Recommendations: 1. Admitting to ICU under Neurology service 2. MRA of head 3. PT consult, OT consult, Speech consult 4. Cardiac telemetry 5. Frequent neuro checks 6. BP management with clevidipine drip  7.  No antiplatelet medications or anticoagulants 8. DVT prophylaxis with SCDs 9. Repeat CT head tonight at 11 PM (24 hours after initial scan) 10. She has a drug eluting stent in her distal RCA. ASA will need to be restarted as soon as safe, to prevent in-stent restenosis.   50 minutes spent in the neurological evaluation and management of this critically ill patient, who is at risk of sudden catastrophic deterioration in the context of right thalamic acute hemorrhage with leftward midline shift.   Electronically signed: Dr. Kerney Elbe 04/10/2021, 8:26 PM

## 2021-04-10 NOTE — Consult Note (Signed)
Neurology Consultation  Reason for Consult: Difficulty walking, weakness Referring Physician: Dr. Mortimer Fries, PCCM  CC: Difficulty walking, weakness  History is obtained from: Chart, patient  HPI: [name] is a 65 y.o. female past medical history of coronary artery disease, hypertension, documented bilateral carotid stenosis, prior strokes, migraines, tobacco abuse, hypertension, hyperlipidemia, presented to the emergency room with last known well on Tuesday, 04/08/2021 and since waking up on Wednesday morning, having trouble walking and keeping her balance with more left-sided weakness, and dizziness. She reports that whenever she started to walk yesterday she started feeling imbalanced-as if her left side was not working as well as the right side.  Also had some nausea with it.  Mild headache at that time but denies a headache at this time. Has a history of hypertension.  Reports to be compliant to medications Spoke with son over the phone-reports presumably she takes her medications regularly. H&P mentions her being on Eliquis and not taking a dose a day prior but the son reports that Eliquis was prescribed after knee replacement for DVT prophylaxis and possibly not on her active med list.  I could not verify this with the pharmacy-this is something that needs to be verified.  In the ED, extremely hypertensive-systolic in the 350K on arrival.  CT head showed right thalamic hemorrhage without IVH, mild right to left shift.  Admitted for blood pressure management-was extremely hypertensive on arrival.   LKW: Sometime on Tuesday, 04/08/2021 tpa given?: no, ICH Premorbid modified Rankin scale (mRS): 1 -limited due to arthritis and chronic pain ICH Score: 0   ROS: Full ROS was performed and is negative except as noted in the HPI.   Past Medical History:  Diagnosis Date  . Anxiety   . Arthritis   . CAD (coronary artery disease)    a. 03/2013 NSTEMI/Cath: 100 RCA, EF 45%->Med Rx;  b. 12/2013  Cath/PCI: LAD 110m, 30d, D1 80, D2 60, LCX nl, RCA 50ost/p, 46m, 95d (2.5x33 Xience DES);  c. 05/2014 Lexi MV: EF 68%, no ischemia; d. stress echo 12/2015 no ischemia @ max exercise (did not achieve target)  . Carotid disease, bilateral ([place])    a. 08/2013 Carotid U/S: 1-39% bilat ICA stenosis.  . Chronic kidney disease    Chronic kidney infections. Takes daily preventative.  . Complication of anesthesia   . CVA (cerebral vascular accident) ([place])    a. 08/2013.  Marland Kitchen CVA (cerebral vascular accident) ([place])   . Decreased libido   . Depression   . GERD (gastroesophageal reflux disease)   . History of 2019 novel coronavirus disease (COVID-19) 07/2019  . History of recurrent UTIs   . Hypercholesteremia   . Hyperlipemia   . Hypertension   . Insomnia   . Ischemic cardiomyopathy    a. 03/2013 Echo: EF 30-35%, mod dil LA, mod-sev MR, mod TR;  b. 08/2013 Echo EF 60-65%, mild AI.  Marland Kitchen Menopause   . Migraines   . Myocardial infarction ([place])   . PONV (postoperative nausea and vomiting)    If anesthsia is given slowly, pt will not throw up, if given quickly, pt will have nausea and vomiting.  . Right lower quadrant pain   . Syncope and collapse   . Tobacco abuse   . Vaginal atrophy         Family History  Problem Relation Age of Onset  . Hypertension Mother   . Stroke Mother   . Hyperlipidemia Mother   . Breast cancer Paternal Grandmother   .  Colon cancer Neg Hx   . Ovarian cancer Neg Hx   . Heart disease Neg Hx   . Diabetes Neg Hx      Social History:   reports that she has been smoking cigarettes. She has a 30.00 pack-year smoking history. She has never used smokeless tobacco. She reports current alcohol use. She reports that she does not use drugs.  Medications  Current Facility-Administered Medications:  .   stroke: mapping our early stages of recovery book, , Does not apply, Once, [place], Bing Neighbors, NP .  acetaminophen (TYLENOL) tablet 650 mg, 650 mg, Oral, Q4H PRN **OR**  acetaminophen (TYLENOL) 160 MG/5ML solution 650 mg, 650 mg, Per Tube, Q4H PRN **OR** acetaminophen (TYLENOL) suppository 650 mg, 650 mg, Rectal, Q4H PRN, Ouma, Bing Neighbors, NP .  amLODipine (NORVASC) tablet 10 mg, 10 mg, Oral, Daily, Kasa, Kurian, MD, 10 mg at 04/10/21 1211 .  Chlorhexidine Gluconate Cloth 2 % PADS 6 each, 6 each, Topical, Daily, Ouma, Bing Neighbors, NP, 6 each at 04/10/21 0600 .  docusate sodium (COLACE) capsule 100 mg, 100 mg, Oral, BID PRN, Lang Snow, NP .  nicardipine (CARDENE) 20mg  in 0.86% saline 277ml IV infusion (0.1 mg/ml), 0-15 mg/hr, Intravenous, Titrated, Carrie Mew, MD, Stopped at 04/10/21 1027 .  pantoprazole (PROTONIX) EC tablet 40 mg, 40 mg, Oral, QHS, Chappell, Alex B, RPH .  polyethylene glycol (MIRALAX / GLYCOLAX) packet 17 g, 17 g, Oral, Daily PRN, Lang Snow, NP .  senna-docusate (Senokot-S) tablet 1 tablet, 1 tablet, Oral, BID, Lang Snow, NP, 1 tablet at 04/10/21 6629   Exam: Current vital signs: BP 116/71   Pulse 64   Temp 98.1 F (36.7 C) (Temporal)   Resp 20   Ht 5\' 2"  (1.575 m)   Wt 66.3 kg   SpO2 98%   BMI 26.73 kg/m  Vital signs in last 24 hours: Temp:  [98.1 F (36.7 C)-99.9 F (37.7 C)] 98.1 F (36.7 C) (06/02 1200) Pulse Rate:  [64-86] 64 (06/02 1200) Resp:  [13-24] 20 (06/02 1200) BP: (96-194)/(51-145) 116/71 (06/02 1211) SpO2:  [90 %-98 %] 98 % (06/02 1200) Weight:  [63.5 kg-66.3 kg] 66.3 kg (06/02 0309) General: Awake alert in no distress HEENT: Normocephalic/atraumaticumatic, dry mm, no LN++, no Thyromegally LUNGS - Clear to auscultation bilaterally with no wheezes CV - S1S2 RRR, no m/r/g, equal pulses bilaterally. ABDOMEN - Soft, nontender, nondistended with normoactive BS Ext: warm, well perfused, intact peripheral pulses, no edema  NEURO:  Mental Status: AA&Ox2 Language: speech is mildly dysarthric.  Naming, repetition, fluency, and comprehension intact.  Mildly  reduced attention concentration Cranial Nerves: PERRL EOMI, visual fields full, left lower facial subtle weakness noted at rest facial sensation intact, hearing intact, tongue/uvula/soft palate midline, normal sternocleidomastoid and trapezius muscle strength. No evidence of tongue atrophy or fibrillations Motor: Left upper and lower extremity drift, right side normal. Tone: is normal and bulk is normal Sensation- Intact to light touch bilaterally Coordination: No gross ataxia noted Gait- deferred  NIHSS 1a Level of Conscious.: 0 1b LOC Questions: 1 1c LOC Commands: 0 2 Best Gaze: 0 3 Visual: 0 4 Facial Palsy: 1 5a Motor Arm - left: 1 5b Motor Arm - Right: 0 6a Motor Leg - Left: 1 6b Motor Leg - Right: 0 7 Limb Ataxia: 0 8 Sensory: 0 9 Best Language: 0 10 Dysarthria: 1 11 Extinct. and Inatten.: 0 TOTAL: 5    Labs I have reviewed labs in epic and the results pertinent  to this consultation are:   CBC    Component Value Date/Time   WBC 13.6 (H) 04/10/2021 0414   RBC 4.26 04/10/2021 0414   HGB 13.1 04/10/2021 0414   HGB 12.4 08/07/2016 1435   HCT 38.0 04/10/2021 0414   HCT 36.3 08/07/2016 1435   PLT 288 04/10/2021 0414   PLT 345 08/07/2016 1435   MCV 89.2 04/10/2021 0414   MCV 91 08/07/2016 1435   MCV 94 02/17/2015 2238   MCH 30.8 04/10/2021 0414   MCHC 34.5 04/10/2021 0414   RDW 13.9 04/10/2021 0414   RDW 13.4 08/07/2016 1435   RDW 13.8 02/17/2015 2238   LYMPHSABS 2.2 04/09/2021 2218   LYMPHSABS 2.0 08/07/2016 1435   LYMPHSABS 2.3 02/17/2015 2238   MONOABS 1.0 04/09/2021 2218   MONOABS 1.0 (H) 02/17/2015 2238   EOSABS 0.3 04/09/2021 2218   EOSABS 0.3 08/07/2016 1435   EOSABS 0.4 02/17/2015 2238   BASOSABS 0.1 04/09/2021 2218   BASOSABS 0.0 08/07/2016 1435   BASOSABS 0.1 02/17/2015 2238    CMP     Component Value Date/Time   NA 141 04/10/2021 0414   NA 142 08/07/2016 1435   NA 138 02/17/2015 2238   K 3.5 04/10/2021 0414   K 2.8 (L) 02/17/2015 2238    CL 109 04/10/2021 0414   CL 101 02/17/2015 2238   CO2 22 04/10/2021 0414   CO2 27 02/17/2015 2238   GLUCOSE 127 (H) 04/10/2021 0414   GLUCOSE 120 (H) 02/17/2015 2238   BUN 23 04/10/2021 0414   BUN 9 08/07/2016 1435   BUN 22 (H) 02/17/2015 2238   CREATININE 0.94 04/10/2021 0414   CREATININE 0.85 02/17/2015 2238   CALCIUM 9.1 04/10/2021 0414   CALCIUM 9.3 02/17/2015 2238   PROT 7.7 04/09/2021 2218   PROT 7.1 02/17/2015 2238   ALBUMIN 3.9 04/09/2021 2218   ALBUMIN 4.0 02/17/2015 2238   AST 24 04/09/2021 2218   AST 22 02/17/2015 2238   ALT 18 04/09/2021 2218   ALT 23 02/17/2015 2238   ALKPHOS 91 04/09/2021 2218   ALKPHOS 79 02/17/2015 2238   BILITOT 0.7 04/09/2021 2218   BILITOT 0.4 02/17/2015 2238   GFRNONAA >60 04/10/2021 0414   GFRNONAA >60 02/17/2015 2238   GFRAA >60 12/27/2017 1140   GFRAA >60 02/17/2015 2238    Lipid Panel     Component Value Date/Time   CHOL 283 (H) 04/10/2021 0414   CHOL 192 05/15/2014 0500   TRIG 320 (H) 04/10/2021 0414   TRIG 155 05/15/2014 0500   HDL 42 04/10/2021 0414   HDL 51 05/15/2014 0500   CHOLHDL 6.7 04/10/2021 0414   VLDL 64 (H) 04/10/2021 0414   VLDL 31 05/15/2014 0500   LDLCALC 177 (H) 04/10/2021 0414   LDLCALC 110 (H) 05/15/2014 0500     Imaging I have reviewed the images obtained:  CT-scan of the brain-and a repeat CT head for stability-right thalamic ICH with no IVH and mild right to left shift.  Impression Right thalamic ICH-likely hypertensive in etiology Hypertensive emergency Possible aspiration pneumonia Left hemiparesis Dysarthria/dysphagia  Recommendations: Strict blood pressure management-blood pressure goal of less than 140/90 Question of her being on Eliquis-according to the son that was for perioperative DVT prophylaxis with knee replacement in January. Need to get more clear history on anticoagulant use at home. No antiplatelets and anticoagulants at this time SCDs for DVT prophylaxis PT OT speech  therapy Check chest x-ray to evaluate for any evidence of aspiration pneumonia MRI  brain with and without contrast to look for underlying vascular malformation Although I do not foresee any imminent neurosurgical intervention need-recommend transfer to [facility] for higher level of care-Per departmental policy  Plan discussed with Dr. Mortimer Fries Plan also discussed with the patient, attendant at bedside and her son over the phone.  Also discussed with Dr. Curly Shores at [facility], who has accepted the patient to her service in the neuro ICU.  -- Amie Portland, MD Neurologist [facility] Pager: [phone]  CRITICAL CARE ATTESTATION Performed by: Amie Portland, MD Total critical care time: 73minutes Critical care time was exclusive of separately billable procedures and treating other patients and/or supervising APPs/Residents/Students Critical care was necessary to treat or prevent imminent or life-threatening deterioration due to [place], hypertensive emergency This patient is critically ill and at significant risk for neurological worsening and/or death and care requires constant monitoring. Critical care was time spent personally by me on the following activities: development of treatment plan with patient and/or surrogate as well as nursing, discussions with consultants, evaluation of patient's response to treatment, examination of patient, obtaining history from patient or surrogate, ordering and performing treatments and interventions, ordering and review of laboratory studies, ordering and review of radiographic studies, pulse oximetry, re-evaluation of patient's condition, participation in multidisciplinary rounds and medical decision making of high complexity in the care of this patient.

## 2021-04-10 NOTE — Progress Notes (Signed)
Pt admitted from ED to ICU-05. Pt is A&Ox2 with no complaints of pain/discomfort. Pt scores 5 on NIH and endorses L-sided weakness that is especially evident when ambulating. Pt on nicardipine gtt for BP control and maintaining a systolic BP between 830-746. Pt had emesis event before arriving on unit, NP notified and orders given to take pt to CT en route to unit. Pt reports no feelings of nausea currently. Pt educated on fall risk prevention strategies and call bell usage. Pt verbalizes understanding of plan of care. Will continue to monitor.

## 2021-04-10 NOTE — Progress Notes (Signed)
PT Cancellation Note  Patient Details Name: [name] MRN: [id number] DOB: [birth year]   Cancelled Treatment:    Reason Eval/Treat Not Completed: Medical issues which prohibited therapy: Per chart review neurologist recommended transfer to [facility] for higher level of care.  Will complete PT orders at this time but will reassess pt pending a change in discharge plans upon receipt of new PT orders.    Linus Salmons PT, DPT 04/10/21, 2:57 PM

## 2021-04-10 NOTE — Progress Notes (Signed)
Pt removed one gold colored ring and one silver colored ring from her fingers and gave them to her boyfriend (she calls him Juanda Crumble or Mo) before leaving the floor for MRI/carotid duplex NIHSS at this time is 2, left side facial drooping and slight dysarthria appear resolved, left arm still demonstrates mild drift.  Pt remains somewhat lethargic but is awake now without requiring stimulation.  She is forgetful and not consistently answering questions correctly.  Sometimes she will state "I had a stroke" when asked why she is at the hospital, other times she states "I don't know.  I couldn't walk without falling."  She cannot recall the exact year, and although she knows she is in a hospital she cannot remember which one. Nicardipene gtt off, BP WDL at this time.  Amlodipine started. Pt taking POs without difficulty.

## 2021-04-10 NOTE — Progress Notes (Signed)
*  PRELIMINARY RESULTS* Echocardiogram 2D Echocardiogram has been performed.  [name] 04/10/2021, 10:12 AM

## 2021-04-11 LAB — HEMOGLOBIN A1C
Hgb A1c MFr Bld: 6 % — ABNORMAL HIGH (ref 4.8–5.6)
Mean Plasma Glucose: 126 mg/dL

## 2021-04-11 LAB — MRSA PCR SCREENING: MRSA by PCR: NEGATIVE

## 2021-04-11 MED ORDER — FLUOXETINE HCL 20 MG PO CAPS
40.0000 mg | ORAL_CAPSULE | Freq: Every day | ORAL | Status: DC
  Administered 2021-04-11 – 2021-04-16 (×6): 40 mg via ORAL
  Filled 2021-04-11 (×6): qty 2

## 2021-04-11 MED ORDER — METOPROLOL TARTRATE 25 MG PO TABS
25.0000 mg | ORAL_TABLET | Freq: Two times a day (BID) | ORAL | Status: DC
  Administered 2021-04-11 – 2021-04-16 (×11): 25 mg via ORAL
  Filled 2021-04-11 (×12): qty 1

## 2021-04-11 MED ORDER — LABETALOL HCL 5 MG/ML IV SOLN
5.0000 mg | INTRAVENOUS | Status: DC | PRN
Start: 2021-04-11 — End: 2021-04-17
  Administered 2021-04-11: 10 mg via INTRAVENOUS
  Filled 2021-04-11 (×2): qty 4

## 2021-04-11 MED ORDER — PANTOPRAZOLE SODIUM 40 MG PO TBEC
40.0000 mg | DELAYED_RELEASE_TABLET | Freq: Every day | ORAL | Status: DC
  Administered 2021-04-11 – 2021-04-16 (×6): 40 mg via ORAL
  Filled 2021-04-11 (×6): qty 1

## 2021-04-11 MED ORDER — BUPROPION HCL ER (SR) 150 MG PO TB12: 150.0000 mg | ORAL_TABLET | Freq: Two times a day (BID) | ORAL | Status: DC

## 2021-04-11 MED ORDER — TRIAMTERENE-HCTZ 37.5-25 MG PO TABS
1.0000 | ORAL_TABLET | Freq: Every day | ORAL | Status: DC
  Filled 2021-04-11: qty 1

## 2021-04-11 MED ORDER — TRIAMTERENE-HCTZ 37.5-25 MG PO TABS
1.0000 | ORAL_TABLET | Freq: Every day | ORAL | Status: DC
  Administered 2021-04-11 – 2021-04-16 (×6): 1 via ORAL
  Filled 2021-04-11 (×6): qty 1

## 2021-04-11 MED ORDER — ROSUVASTATIN CALCIUM 20 MG PO TABS
20.0000 mg | ORAL_TABLET | Freq: Every day | ORAL | Status: DC
  Administered 2021-04-11 – 2021-04-16 (×6): 20 mg via ORAL
  Filled 2021-04-11 (×6): qty 1

## 2021-04-11 NOTE — Evaluation (Signed)
Occupational Therapy Evaluation Patient Details Name: [name] MRN: [id number] DOB: September 28, 1956 Today's Date: 04/11/2021    History of Present Illness 65 yo female admitted with R thalamic ICH 2' HTN and small vessel disease PMH R BG infarct 08/2013 former smoker CAD w/p stennting migraines   Clinical Impression   PT admitted with CVA. Pt currently with functional limitiations due to the deficits listed below (see OT problem list). PTA was indep with all adls and iadls living alone. Pt currently min guard for bed mobility with BP change and sudden return to supine. Pt noted to have ataxic movement with cell phone use.  Pt will benefit from skilled OT to increase their independence and safety with adls and balance to allow discharge CIR ( pending progress reports son can (A).     Follow Up Recommendations  CIR (pending progress)    Equipment Recommendations  3 in 1 bedside commode    Recommendations for Other Services Rehab consult     Precautions / Restrictions Precautions Precautions: Fall Precaution Comments: SBP < 160      Mobility Bed Mobility Overal bed mobility: Needs Assistance Bed Mobility: Supine to Sit;Sit to Supine     Supine to sit: Min guard Sit to supine: Min guard   General bed mobility comments: able to come to sitting from the Right side    Transfers                 General transfer comment: Not tested due to BP    Balance                                           ADL either performed or assessed with clinical judgement   ADL Overall ADL's : Needs assistance/impaired Eating/Feeding: NPO Eating/Feeding Details (indicate cue type and reason): coughing on salvia in session x2 Grooming: Wash/dry face;Set up;Bed level                     Toilet Transfer Details (indicate cue type and reason): unable to progress due to BP           General ADL Comments: Pt supine to sit without assist but noted to have decr  BP 117/86 and supine with >20 point SBP drop     Vision Baseline Vision/History: Wears glasses Wears Glasses: Reading only Vision Assessment?: Yes     Perception Perception Perception Tested?: Yes Perception Deficits: Inattention/neglect Inattention/Neglect: Does not attend to left visual field Comments: pt more responsive when therapist on R side   Praxis      Pertinent Vitals/Pain Pain Assessment: No/denies pain     Hand Dominance Right   Extremity/Trunk Assessment Upper Extremity Assessment Upper Extremity Assessment: LUE deficits/detail LUE Deficits / Details: drift, ataxic movement. tremor with cell phone use LUE Sensation:  (denies when asked about sensation) LUE Coordination: decreased fine motor   Lower Extremity Assessment Lower Extremity Assessment: Defer to PT evaluation   Cervical / Trunk Assessment Cervical / Trunk Assessment: Normal   Communication Communication Communication: Expressive difficulties   Cognition Arousal/Alertness: Lethargic Behavior During Therapy: Flat affect Overall Cognitive Status: No family/caregiver present to determine baseline cognitive functioning                                 General Comments: pt with delayed  responses to questions but able to give answer with 5-10 second delay. pt states i dont have a cell phone iwth a cell phone going off. OT pointing to purple charge cord and pt says nope., OT following the cord and locationg pT cell phone under mattress at [facility]. pt states "hey thats my phone"   General Comments  Supine on arrival 144/87 sitting legs partially supported 117/86 (98) and BP supine 175/91  pt did cough becaue of aspiration of salvia    Exercises     Shoulder Instructions      Home Living Family/patient expects to be discharged to:: Private residence Living Arrangements: Alone Available Help at Discharge: Family;Available PRN/intermittently (son) Type of Home: House Home Access: Level  entry     Home Layout: One level     Bathroom Shower/Tub: Occupational psychologist: Handicapped height     Home Equipment: Environmental consultant - 2 wheels;Bedside commode   Additional Comments: have not worked since [place] does not returnto work. Grandson 52 yo named Grayce Sessions      Prior Functioning/Environment Level of Independence: Independent        Comments: Indep with ADLs, household and community mobilization without assist device; denies fall history.        OT Problem List: Decreased strength;Impaired balance (sitting and/or standing);Decreased cognition      OT Treatment/Interventions: Self-care/ADL training;DME and/or AE instruction;Therapeutic activities;Cognitive remediation/compensation;Visual/perceptual remediation/compensation;Patient/family education;Balance training    OT Goals(Current goals can be found in the care plan section) Acute Rehab OT Goals Patient Stated Goal: none stated reports that sleeping is what she likes to do at home OT Goal Formulation: With patient Time For Goal Achievement: 04/25/21 Potential to Achieve Goals: Good  OT Frequency: Min 3X/week   Barriers to D/C: Decreased caregiver support          Co-evaluation              AM-PAC OT "6 Clicks" Daily Activity     Outcome Measure Help from another person eating meals?: A Little Help from another person taking care of personal grooming?: A Little Help from another person toileting, which includes using toliet, bedpan, or urinal?: A Little Help from another person bathing (including washing, rinsing, drying)?: A Little Help from another person to put on and taking off regular upper body clothing?: A Little Help from another person to put on and taking off regular lower body clothing?: A Little 6 Click Score: 18   End of Session Nurse Communication: Mobility status;Precautions  Activity Tolerance: Treatment limited secondary to medical complications (Comment) (BP elevation past  recommendations) Patient left: in bed;with call bell/phone within reach;with bed alarm set;with SCD's reapplied  OT Visit Diagnosis: Unsteadiness on feet (R26.81);Muscle weakness (generalized) (M62.81)                Time: 1032-1050 OT Time Calculation (min): 18 min Charges:  OT General Charges $OT Visit: 1 Visit OT Evaluation $OT Eval Moderate Complexity: 1 Mod   Brynn, OTR/L  Acute Rehabilitation Services Pager: [phone] Office: [phone] .   Jeri Modena 04/11/2021, 11:08 AM

## 2021-04-11 NOTE — TOC Initial Note (Signed)
Transition of Care [facility]) - Initial/Assessment Note    Patient Details  Name: [name] MRN: [id number] Date of Birth: 10-29-1956  Transition of [facility]) CM/SW Contact:    Verdell Carmine, RN Phone Number: 04/11/2021, 8:52 AM  Clinical Narrative:                 65 YO admitted with stroke like symptoms, ICH hypertensive. Weakness. Has son, neighbor. Currently confused, only oriented to self. Marland Kitchenawaiting PT OT recommendations. May need CIR or SNF. M will follow for needs  Expected Discharge Plan: [place] (vs SNF) Barriers to Discharge: Continued Medical Work up   Patient Goals and CMS Choice        Expected Discharge Plan and Services Expected Discharge Plan: [place] (vs SNF) In-house Referral: Clinical Social Work Discharge Planning Services: CM Consult                                          Prior Living Arrangements/Services     Patient language and need for interpreter reviewed:: Yes        Need for Family Participation in Patient Care: Yes (Comment) Care giver support system in place?: Yes (comment)   Criminal Activity/Legal Involvement Pertinent to Current Situation/Hospitalization: No - Comment as needed  Activities of Daily Living      Permission Sought/Granted                  Emotional Assessment       Orientation: : Fluctuating Orientation (Suspected and/or reported Sundowners) Alcohol / Substance Use: Not Applicable Psych Involvement: No (comment)  Admission diagnosis:  ICH (intracerebral hemorrhage) ([place]) [I61.9] Patient Active Problem List   Diagnosis Date Noted  . Thalamic hemorrhage ([place]) 04/10/2021  . ICH (intracerebral hemorrhage) ([place]) 04/10/2021  . Pelvic pain-right lower quadrant 07/14/2018  . Right lower quadrant abdominal pain 06/30/2018  . Bilateral carotid artery stenosis 03/12/2018  . Chronic systolic CHF (congestive heart failure) ([place]) 03/12/2018  . Status post total  knee replacement using cement, left 10/06/2016  . History of coronary artery stent placement   . Pure hypercholesterolemia   . Recurrent UTI 02/11/2016  . Menopause 02/11/2016  . Angina pectoris ([place]) 01/07/2016  . Syncope 07/11/2015  . Orthostatic hypotension 02/19/2015  . Dehydration 02/19/2015  . Hypokalemia 02/19/2015  . Stroke ([place]) 02/18/2015  . Tobacco abuse 02/18/2015  . History of stroke 07/18/2014  . Vertigo 07/18/2014  . Bilateral wrist pain 07/18/2014  . Atypical chest pain 06/05/2014  . Coronary artery disease of native artery of native heart with stable angina pectoris ([place]) 01/19/2014  . Carotid arterial disease ([place]) 01/19/2014  . Depression 01/16/2014  . Memory loss 01/16/2014  . Right sided weakness 08/10/2013  . Essential hypertension 08/10/2013  . Mixed hyperlipidemia 08/10/2013  . History of MI (myocardial infarction) 08/10/2013  . Stroke, acute, embolic ([place]) 53/29/9242   PCP:  Baxter Hire, MD Pharmacy:   [id number] [address], [place] [address] [place] [address] Phone: [phone] Fax: [phone]  [place] ([place], [place] [address] Phone: [phone] Fax: [phone]  [id number] - [place], [place] [address] [place] [address] Phone: [phone] Fax: [phone]     Social Determinants of Health (SDOH) Interventions    Readmission Risk Interventions No flowsheet data found.

## 2021-04-11 NOTE — Progress Notes (Signed)
STROKE TEAM PROGRESS NOTE   SUBJECTIVE (INTERVAL HISTORY) Her pastor is at the bedside.  Overall her condition is stable. Pt awake alert orientated to place time and age. Still has mild left facial droop and left arm weakness. Did not pass swallow screen, waiting for formal test. BP at goal, on labetalol PRN.   Pt stated that she woke up yesterday going to bathroom and fell. Her roommate put her back to bed and went to work. He later came back and called pt sister, who called EMS.   OBJECTIVE Temp:  [97.5 F (36.4 C)-98.6 F (37 C)] 98.6 F (37 C) (06/03 0736) Pulse Rate:  [64-83] 83 (06/03 0736) Cardiac Rhythm: Normal sinus rhythm (06/03 0700) Resp:  [15-21] 15 (06/03 0736) BP: (116-148)/(65-81) 148/72 (06/03 0736) SpO2:  [90 %-98 %] 95 % (06/03 0736)  Recent Labs  Lab 04/09/21 2251 04/10/21 0308 04/10/21 1520  GLUCAP 117* 142* 101*   Recent Labs  Lab 04/09/21 2218 04/10/21 0414 04/10/21 2109  NA 140 141 137  K 3.7 3.5 3.4*  CL 108 109 105  CO2 22 22 24   GLUCOSE 114* 127* 114*  BUN 18 23 15   CREATININE 0.84 0.94 0.90  CALCIUM 9.3 9.1 9.0  MG  --  1.9  --    Recent Labs  Lab 04/09/21 2218 04/10/21 2109  AST 24 26  ALT 18 19  ALKPHOS 91 82  BILITOT 0.7 1.2  PROT 7.7 6.5  ALBUMIN 3.9 3.4*   Recent Labs  Lab 04/09/21 2218 04/10/21 0414 04/10/21 2109  WBC 11.2* 13.6* 10.4  NEUTROABS 7.7  --   --   HGB 13.1 13.1 12.1  HCT 38.5 38.0 36.3  MCV 90.2 89.2 91.0  PLT 273 288 277   No results for input(s): CKTOTAL, CKMB, CKMBINDEX, TROPONINI in the last 168 hours. Recent Labs    04/09/21 2218  LABPROT 13.4  INR 1.0   No results for input(s): COLORURINE, LABSPEC, PHURINE, GLUCOSEU, HGBUR, BILIRUBINUR, KETONESUR, PROTEINUR, UROBILINOGEN, NITRITE, LEUKOCYTESUR in the last 72 hours.  Invalid input(s): APPERANCEUR     Component Value Date/Time   CHOL 283 (H) 04/10/2021 0414   CHOL 192 05/15/2014 0500   TRIG 320 (H) 04/10/2021 0414   TRIG 155 05/15/2014  0500   HDL 42 04/10/2021 0414   HDL 51 05/15/2014 0500   CHOLHDL 6.7 04/10/2021 0414   VLDL 64 (H) 04/10/2021 0414   VLDL 31 05/15/2014 0500   LDLCALC 177 (H) 04/10/2021 0414   LDLCALC 110 (H) 05/15/2014 0500   Lab Results  Component Value Date   HGBA1C 6.0 (H) 04/10/2021      Component Value Date/Time   LABOPIA NONE DETECTED 02/18/2015 0410   COCAINSCRNUR NONE DETECTED 02/18/2015 0410   LABBENZ NONE DETECTED 02/18/2015 0410   AMPHETMU NONE DETECTED 02/18/2015 0410   THCU NONE DETECTED 02/18/2015 0410   LABBARB NONE DETECTED 02/18/2015 0410    No results for input(s): ETH in the last 168 hours.  I have personally reviewed the radiological images below and agree with the radiology interpretations.  CT HEAD WO CONTRAST  Result Date: 04/10/2021 CLINICAL DATA:  Intracranial hemorrhage follow up EXAM: CT HEAD WITHOUT CONTRAST TECHNIQUE: Contiguous axial images were obtained from the base of the skull through the vertex without intravenous contrast. COMPARISON:  04/10/2021 at 2:50 a.m. FINDINGS: Brain: Unchanged size of intraparenchymal hematoma centered in the right thalamus. No new site of hemorrhage. There is periventricular hypoattenuation compatible with chronic microvascular disease. Mild generalized volume loss.  Unchanged size and configuration of the ventricles. Chronic small vessel infarct of the left basal ganglia. Vascular: No hyperdense vessel or unexpected calcification. Skull: Normal. Negative for fracture or focal lesion. Sinuses/Orbits: No acute finding. Other: None IMPRESSION: Unchanged size of intraparenchymal hematoma centered in the right thalamus. Electronically Signed   By: Ulyses Jarred M.D.   On: 04/10/2021 23:15   CT HEAD WO CONTRAST  Result Date: 04/10/2021 CLINICAL DATA:  Intracranial hemorrhage follow-up EXAM: CT HEAD WITHOUT CONTRAST TECHNIQUE: Contiguous axial images were obtained from the base of the skull through the vertex without intravenous contrast.  COMPARISON:  04/09/2021 FINDINGS: Brain: Unchanged size of intraparenchymal hemorrhage in the right thalamus. Trace leftward midline shift is unchanged. Old left caudate infarct. There is periventricular hypoattenuation compatible with chronic microvascular disease. Vascular: No abnormal hyperdensity of the major intracranial arteries or dural venous sinuses. No intracranial atherosclerosis. Skull: The visualized skull base, calvarium and extracranial soft tissues are normal. Sinuses/Orbits: No fluid levels or advanced mucosal thickening of the visualized paranasal sinuses. No mastoid or middle ear effusion. The orbits are normal. IMPRESSION: Unchanged size of right thalamic intraparenchymal hemorrhage with trace leftward midline shift. Electronically Signed   By: Ulyses Jarred M.D.   On: 04/10/2021 03:12   CT HEAD WO CONTRAST  Result Date: 04/09/2021 CLINICAL DATA:  TIA. EXAM: CT HEAD WITHOUT CONTRAST TECHNIQUE: Contiguous axial images were obtained from the base of the skull through the vertex without intravenous contrast. COMPARISON:  05/18/2020 FINDINGS: Brain: There is hemorrhage within the right thalamus measuring 1.8 x 1.2 x 1.3 cm. Surrounding vasogenic edema. 5 mm of associated right to left midline shift. No hydrocephalus. Old left basal ganglia lacunar infarct. Chronic small vessel disease throughout the deep white matter. Vascular: No hyperdense vessel or unexpected calcification. Skull: No acute calvarial abnormality. Sinuses/Orbits: Visualized paranasal sinuses and mastoids clear. Orbital soft tissues unremarkable. Other: None IMPRESSION: Acute right thalamic hemorrhage. 5 mm of right to left midline shift. Old left basal ganglia lacunar infarct. Critical Value/emergent results were called by telephone at the time of interpretation on 04/09/2021 at 10:38 pm to provider [facility] , who verbally acknowledged these results. Electronically Signed   By: Rolm Baptise M.D.   On: 04/09/2021 22:40   MR MRA  HEAD WO CONTRAST  Result Date: 04/10/2021 CLINICAL DATA:  Intraparenchymal hemorrhage of the right thalamus EXAM: MRA HEAD WITHOUT CONTRAST TECHNIQUE: Angiographic images of the Circle of Willis were acquired using MRA technique without intravenous contrast. COMPARISON:  No pertinent prior exam. FINDINGS: POSTERIOR CIRCULATION: --Vertebral arteries: Normal --Inferior cerebellar arteries: Poor visualization right PICA. Normal left PICA. --Basilar artery: Normal. --Superior cerebellar arteries: Normal. --Posterior cerebral arteries: Multifocal moderate stenosis of the left PCA. ANTERIOR CIRCULATION: --Intracranial internal carotid arteries: Normal. --Anterior cerebral arteries (ACA): Normal. --Middle cerebral arteries (MCA): Normal. ANATOMIC VARIANTS: None IMPRESSION: 1. No proximal large vessel occlusion or high-grade stenosis. 2. Multifocal moderate stenosis of the left PCA. Electronically Signed   By: Ulyses Jarred M.D.   On: 04/10/2021 23:36   MR BRAIN WO CONTRAST  Result Date: 04/10/2021 CLINICAL DATA:  Intracranial hemorrhage EXAM: MRI HEAD WITHOUT CONTRAST TECHNIQUE: Multiplanar, multiecho pulse sequences of the brain and surrounding structures were obtained without intravenous contrast. COMPARISON:  Correlation made with recent CT imaging.  MRI 2016 FINDINGS: Brain: Acute hemorrhage within the right thalamus is again identified with surrounding edema effacing the adjacent third ventricle. There is no intraventricular extension. No hydrocephalus. Chronic infarct of the left basal ganglia and adjacent white matter.  Additional patchy and confluent areas of T2 hyperintensity in the supratentorial greater than pontine white matter are nonspecific but probably reflect moderate chronic microvascular ischemic changes. There is a focus of susceptibility in the left frontal subcortical white matter; adjacent linear susceptibility could reflect a developmental venous anomaly and therefore this may represent a  cavernous malformation. There is no intracranial mass, noting that there is limited evaluation for underlying right thalamic lesion without contrast. There is no hydrocephalus or extra-axial fluid collection. Vascular: Major vessel flow voids at the skull base are preserved. Skull and upper cervical spine: Normal marrow signal is preserved. Sinuses/Orbits: Paranasal sinuses are aerated. Orbits are unremarkable. Other: Sella is unremarkable.  Mastoid air cells are clear. IMPRESSION: Acute right thalamic hemorrhage with mild mass effect. No intraventricular extension. Moderate chronic microvascular ischemic changes. 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 Electronically Signed   By: Macy Mis M.D.   On: 04/10/2021 14:02   US Carotid Bilateral (at [facility] and [facility] only)  Result Date: 04/10/2021 CLINICAL DATA:  Hyperlipidemia, stroke symptoms EXAM: BILATERAL CAROTID DUPLEX ULTRASOUND TECHNIQUE: Pearline Cables scale imaging, color Doppler and duplex ultrasound were performed of bilateral carotid and vertebral arteries in the neck. COMPARISON:  None. FINDINGS: Criteria: Quantification of carotid stenosis is based on velocity parameters that correlate the residual internal carotid diameter with NASCET-based stenosis levels, using the diameter of the distal internal carotid lumen as the denominator for stenosis measurement. The following velocity measurements were obtained: RIGHT ICA: 68/17 cm/sec CCA: 32/67 cm/sec SYSTOLIC ICA/CCA RATIO:  0.8 ECA: 90 cm/sec LEFT ICA: 64/10 cm/sec CCA: 12/45 cm/sec SYSTOLIC ICA/CCA RATIO:  0.8 ECA: 113 cm/sec RIGHT CAROTID ARTERY: Minor echogenic shadowing plaque formation. No hemodynamically significant right ICA stenosis, velocity elevation, or turbulent flow. Degree of narrowing less than 50%. RIGHT VERTEBRAL ARTERY:  Normal antegrade flow LEFT CAROTID ARTERY: Similar scattered minor echogenic plaque formation. No  hemodynamically significant left ICA stenosis, velocity elevation, or turbulent flow. LEFT VERTEBRAL ARTERY:  Normal antegrade flow IMPRESSION: Bilateral carotid atherosclerosis. No hemodynamically significant ICA stenosis. Degree of narrowing less than 50% bilaterally by ultrasound criteria. Patent antegrade vertebral flow bilaterally Electronically Signed   By: Jerilynn Mages.  Shick M.D.   On: 04/10/2021 14:46   ECHOCARDIOGRAM COMPLETE  Result Date: 04/10/2021    ECHOCARDIOGRAM REPORT   Patient Name:   LARRAINE ARGO Date of Exam: 04/10/2021 Medical Rec #:  [id number]     Height:       62.0 in Accession #:    [id number]    Weight:       146.2 lb Date of Birth:  1956/05/05     BSA:          1.673 m Patient Age:    29 years      BP:           114/60 mmHg Patient Gender: F             HR:           73 bpm. Exam Location:  [facility] Procedure: 2D Echo, Color Doppler and Cardiac Doppler Indications:     I63.9 Stroke  History:         Patient has prior history of Echocardiogram examinations.                  Stroke; Risk Factors:Current Smoker, Hypertension, Dyslipidemia                  and  HCL.  Sonographer:     Charmayne Sheer RDCS (AE) Referring Phys:  [id number] Bing Neighbors OUMA Diagnosing Phys: Kathlyn Sacramento MD  Sonographer Comments: Suboptimal apical window. Global longitudinal strain was attempted. IMPRESSIONS  1. Left ventricular ejection fraction, by estimation, is 55 to 60%. The left ventricle has normal function. The left ventricle has no regional wall motion abnormalities. Left ventricular diastolic parameters are consistent with Grade I diastolic dysfunction (impaired relaxation). The global longitudinal strain is normal.  2. Right ventricular systolic function is normal. The right ventricular size is normal. Tricuspid regurgitation signal is inadequate for assessing PA pressure.  3. Trivial pericardial effusion is present. The pericardial effusion is circumferential.  4. The mitral valve is normal in structure. No evidence  of mitral valve regurgitation. No evidence of mitral stenosis.  5. The aortic valve is normal in structure. Aortic valve regurgitation is mild. Mild aortic valve sclerosis is present, with no evidence of aortic valve stenosis.  6. The inferior vena cava is normal in size with greater than 50% respiratory variability, suggesting right atrial pressure of 3 mmHg. FINDINGS  Left Ventricle: Left ventricular ejection fraction, by estimation, is 55 to 60%. The left ventricle has normal function. The left ventricle has no regional wall motion abnormalities. The global longitudinal strain is normal. The left ventricular internal cavity size was normal in size. There is no left ventricular hypertrophy. Left ventricular diastolic parameters are consistent with Grade I diastolic dysfunction (impaired relaxation). Right Ventricle: The right ventricular size is normal. No increase in right ventricular wall thickness. Right ventricular systolic function is normal. Tricuspid regurgitation signal is inadequate for assessing PA pressure. Left Atrium: Left atrial size was normal in size. Right Atrium: Right atrial size was normal in size. Pericardium: Trivial pericardial effusion is present. The pericardial effusion is circumferential. Mitral Valve: The mitral valve is normal in structure. No evidence of mitral valve regurgitation. No evidence of mitral valve stenosis. MV peak gradient, 3.4 mmHg. The mean mitral valve gradient is 1.0 mmHg. Tricuspid Valve: The tricuspid valve is normal in structure. Tricuspid valve regurgitation is not demonstrated. No evidence of tricuspid stenosis. Aortic Valve: The aortic valve is normal in structure. Aortic valve regurgitation is mild. Mild aortic valve sclerosis is present, with no evidence of aortic valve stenosis. Aortic valve mean gradient measures 5.0 mmHg. Aortic valve peak gradient measures 11.3 mmHg. Aortic valve area, by VTI measures 1.61 cm. Pulmonic Valve: The pulmonic valve was normal  in structure. Pulmonic valve regurgitation is mild. No evidence of pulmonic stenosis. Aorta: The aortic root is normal in size and structure. Venous: The inferior vena cava is normal in size with greater than 50% respiratory variability, suggesting right atrial pressure of 3 mmHg. IAS/Shunts: No atrial level shunt detected by color flow Doppler.  LEFT VENTRICLE PLAX 2D LVIDd:         4.60 cm  Diastology LVIDs:         3.10 cm  LV e' medial:    6.42 cm/s LV PW:         1.20 cm  LV E/e' medial:  9.2 LV IVS:        0.90 cm  LV e' lateral:   6.20 cm/s LVOT diam:     1.90 cm  LV E/e' lateral: 9.5 LV SV:         47 LV SV Index:   28 LVOT Area:     2.84 cm  RIGHT VENTRICLE RV Basal diam:  2.50 cm LEFT ATRIUM  Index       RIGHT ATRIUM           Index LA diam:      3.40 cm 2.03 cm/m  RA Area:     10.30 cm LA Vol (A2C): 40.5 ml 24.21 ml/m RA Volume:   17.80 ml  10.64 ml/m LA Vol (A4C): 41.2 ml 24.62 ml/m  AORTIC VALVE                    PULMONIC VALVE AV Area (Vmax):    1.74 cm     PV Vmax:       1.26 m/s AV Area (Vmean):   1.75 cm     PV Vmean:      84.600 cm/s AV Area (VTI):     1.61 cm     PV VTI:        0.275 m AV Vmax:           168.00 cm/s  PV Peak grad:  6.4 mmHg AV Vmean:          101.000 cm/s PV Mean grad:  3.0 mmHg AV VTI:            0.292 m AV Peak Grad:      11.3 mmHg AV Mean Grad:      5.0 mmHg LVOT Vmax:         103.00 cm/s LVOT Vmean:        62.300 cm/s LVOT VTI:          0.166 m LVOT/AV VTI ratio: 0.57  AORTA Ao Root diam: 3.30 cm MITRAL VALVE MV Area (PHT): 3.60 cm    SHUNTS MV Area VTI:   1.89 cm    Systemic VTI:  0.17 m MV Peak grad:  3.4 mmHg    Systemic Diam: 1.90 cm MV Mean grad:  1.0 mmHg MV Vmax:       0.92 m/s MV Vmean:      52.7 cm/s MV Decel Time: 211 msec MV E velocity: 59.10 cm/s MV A velocity: 90.80 cm/s MV E/A ratio:  0.65 Kathlyn Sacramento MD Electronically signed by Kathlyn Sacramento MD Signature Date/Time: 04/10/2021/11:30:41 AM    Final     PHYSICAL EXAM  Temp:  [97.5 F  (36.4 C)-98.6 F (37 C)] 98.6 F (37 C) (06/03 0736) Pulse Rate:  [64-83] 83 (06/03 0736) Resp:  [15-21] 15 (06/03 0736) BP: (116-148)/(65-81) 148/72 (06/03 0736) SpO2:  [90 %-98 %] 95 % (06/03 0736)  General - Well nourished, well developed, in no apparent distress.  Ophthalmologic - fundi not visualized due to noncooperation.  Cardiovascular - Regular rhythm and rate.  Mental Status -  Level of arousal and orientation to time, place, and person were intact. Language including expression, repetition, comprehension was assessed and found intact. Naming 2/4 Fund of Knowledge was assessed and was intact.  Cranial Nerves II - XII - II - Visual field intact OU. III, IV, VI - Extraocular movements intact. V - Facial sensation intact bilaterally. VII - mild left facial droop. VIII - Hearing & vestibular intact bilaterally. X - Palate elevates symmetrically. XI - Chin turning & shoulder shrug intact bilaterally. XII - Tongue protrusion intact.  Motor Strength - The patient's strength was normal in RUE and BLEs. however left UE 5/5 finger grip and bicep but 4/5 tricep and deltoid, slight pronator drift. Bulk was normal and fasciculations were absent.   Motor Tone - Muscle tone was assessed at the neck and appendages and was  normal.  Reflexes - The patient's reflexes were symmetrical in all extremities and she had no pathological reflexes.  Sensory - Light touch, temperature/pinprick were assessed and were symmetrical.    Coordination - The patient had normal movements in the hands with no ataxia or dysmetria.  Tremor was absent.  Gait and Station - deferred.   ASSESSMENT/PLAN Ms. ALMEDA EZRA is a 65 y.o. female with history of HTN, HLD, CAD s/p stent 2015, stroke in 2014, former smoker, migraine admitted for left sided weakness, fall, imbalance. No tPA given due to [place].    ICH:  right thalamic ICH secondary to HTN and small vessel disease  Resultant left arm weakness mild  left facial droop  CT head small right thalamic ICH  Repeat CT stable right thalamic ICH  MRI  Stable right thalamic ICH, possible occult cavernous malformation of the left frontal lobe with associated developmental venous anomaly.  MRA Multifocal moderate stenosis of the left PCA.  Carotid Doppler  Unremarkable   2D Echo  EF 55-60%  LDL 177  HgbA1c 6.0  SCDs for VTE prophylaxis  No antithrombotic prior to admission, now on No antithrombotic due to [place]  Ongoing aggressive stroke risk factor management  Therapy recommendations:  Pending   Disposition:  Pending   Hx of stroke  08/2013 right BG infarct without residue  02/2015 right sided weakness, facial droop, slurry speech with low BP. MRI negative for stroke. MRA and CUS neg. EF 55-60%. A1C 6.0 and LDL 69, discharged with DAPT and lipitor 40  Hypertension . Stable . BP goal < 160 . On labetalol PRN . Will resume amlodipine once po access  Long term BP goal normotensive  Hyperlipidemia  Home meds:  none   LDL 177, goal < 70  Will start statin once po access  Continue statin at discharge  Dysphagia   Did not pass bedside swallow screen  Pending formal swallow eval  Once pass po will start po meds  If no po access, will need cortrak placement and TF  Other Stroke Risk Factors  Formal Cigarette smoker, quit for several months now  Coronary artery disease s/p stenting  Migraines  Other Active Problems  Hypokalemia K 3.4 - will supplement via po or cortrak  Hospital day # 1  I spent  35 minutes in total face-to-face time with the patient, more than 50% of which was spent in counseling and coordination of care, reviewing test results, images and medication, and discussing the diagnosis, treatment plan and potential prognosis. This patient's care requiresreview of multiple databases, neurological assessment, discussion with family, other specialists and medical decision making of high  complexity.   Rosalin Hawking, MD PhD Stroke Neurology 04/11/2021 9:36 AM    To contact Stroke Continuity provider, please refer to [email or web address] After hours, contact General Neurology

## 2021-04-11 NOTE — Evaluation (Signed)
Physical Therapy Evaluation Patient Details Name: [name] MRN: [id number] DOB: [birth year] Today's Date: 04/11/2021   History of Present Illness  Pt is 65 yo female admitted with R thalamic ICH 2' HTN and small vessel disease admitted 04/10/21. PMH R BG infarct 08/2013 former smoker CAD w/p stennting migraines  Clinical Impression  Pt admitted with above diagnosis. Pt is normally independent and lives alone.  Today, she presents with good strength but decreased balance, coordination, and L inattention.  Had assist of 2 for safety due to orthostatic hypotension with OT earlier, but medications had been changed and BP was stable this afternoon. Will benefit from PT.  At this time recommending CIR - pending progress. Pt currently with functional limitations due to the deficits listed below (see PT Problem List). Pt will benefit from skilled PT to increase their independence and safety with mobility to allow discharge to the venue listed below.       Follow Up Recommendations CIR (pending progress)    Equipment Recommendations  None recommended by PT    Recommendations for Other Services       Precautions / Restrictions Precautions Precautions: Fall Precaution Comments: SBP < 160      Mobility  Bed Mobility Overal bed mobility: Needs Assistance Bed Mobility: Supine to Sit     Supine to sit: Min guard     General bed mobility comments: able to come to sitting from the Right side    Transfers Overall transfer level: Needs assistance Equipment used: 2 person hand held assist Transfers: Sit to/from Stand Sit to Stand: Min assist;+2 safety/equipment         General transfer comment: Had tech present for safety due to orthostatic in am with OT.  Performed sit to stand with cues and min A to steady.  Ambulation/Gait Ambulation/Gait assistance: Min assist;+2 safety/equipment Gait Distance (Feet): 4 Feet   Gait Pattern/deviations: Step-to pattern;Decreased stride  length;Shuffle Gait velocity: decreased   General Gait Details: Had tech present for safety due to orthostatic in am with OT.  Took steps to chair with small step length and decreased foot clearance. Min A to steady, fatigued easily.  Used HHA of 2, when therapist cued pt for HHA on both sides she had difficulty getting therapist hand on L side - seemed to be due to inattention  Stairs            Wheelchair Mobility    Modified Rankin (Stroke Patients Only) Modified Rankin (Stroke Patients Only) Pre-Morbid Rankin Score: No symptoms Modified Rankin: Moderately severe disability     Balance Overall balance assessment: Needs assistance Sitting-balance support: No upper extremity supported Sitting balance-Leahy Scale: Fair Sitting balance - Comments: Sat EOB for about 8 mins - had conversation on phone with her mother.  Had 1 LOB to L during MMT using L UE to recover   Standing balance support: Bilateral upper extremity supported Standing balance-Leahy Scale: Poor Standing balance comment: Required HHA of 2                             Pertinent Vitals/Pain Pain Assessment: No/denies pain    Home Living Family/patient expects to be discharged to:: Private residence Living Arrangements: Alone Available Help at Discharge: Family;Available PRN/intermittently Type of Home: House Home Access: Level entry     Home Layout: One level Home Equipment: Walker - 2 wheels;Bedside commode      Prior Function  Comments: Indep with ADLs, household and community mobilization without assist device; denies fall history.     Hand Dominance   Dominant Hand: Right    Extremity/Trunk Assessment   Upper Extremity Assessment Upper Extremity Assessment: Defer to OT evaluation    Lower Extremity Assessment Lower Extremity Assessment: LLE deficits/detail;RLE deficits/detail RLE Deficits / Details: ROM WFL; MMT 5/5 LLE Deficits / Details: ROM WFL, MMT 5/5,  sensation intact LLE Coordination: decreased gross motor (mild decrease with heel/shin)    Cervical / Trunk Assessment Cervical / Trunk Assessment: Normal  Communication   Communication: Expressive difficulties  Cognition Arousal/Alertness: Lethargic Behavior During Therapy: Flat affect Overall Cognitive Status: No family/caregiver present to determine baseline cognitive functioning                                 General Comments: Pt with delayed responses.  L Inattention but can look left with cues.      General Comments General comments (skin integrity, edema, etc.): BP supine 123/90, Sit 138/77, standing 137/78  L inattention: Pt keeping head turned to R at rest - could look L with cues.  Smooth pursuit and visual fields intact but needed cues to look L.  Difficulty giving therapist L hand to hold for ambulation.   Exercises     Assessment/Plan    PT Assessment Patient needs continued PT services  PT Problem List Decreased strength;Decreased mobility;Decreased safety awareness;Decreased coordination;Decreased knowledge of precautions;Decreased activity tolerance;Decreased cognition;Decreased balance;Cardiopulmonary status limiting activity;Decreased knowledge of use of DME       PT Treatment Interventions DME instruction;Therapeutic activities;Cognitive remediation;Gait training;Therapeutic exercise;Patient/family education;Stair training;Balance training;Functional mobility training;Neuromuscular re-education    PT Goals (Current goals can be found in the Care Plan section)  Acute Rehab PT Goals Patient Stated Goal: get some sleep PT Goal Formulation: With patient Time For Goal Achievement: 04/25/21 Potential to Achieve Goals: Good Additional Goals Additional Goal #1: Will score >19 on DGI to indicate lower fall risk    Frequency Min 4X/week   Barriers to discharge        Co-evaluation               AM-PAC PT "6 Clicks" Mobility  Outcome  Measure Help needed turning from your back to your side while in a flat bed without using bedrails?: A Little Help needed moving from lying on your back to sitting on the side of a flat bed without using bedrails?: A Little Help needed moving to and from a bed to a chair (including a wheelchair)?: A Little Help needed standing up from a chair using your arms (e.g., wheelchair or bedside chair)?: A Little Help needed to walk in hospital room?: A Lot Help needed climbing 3-5 steps with a railing? : A Lot 6 Click Score: 16    End of Session Equipment Utilized During Treatment: Gait belt Activity Tolerance: Patient tolerated treatment well Patient left: with chair alarm set;in chair;with call bell/phone within reach Nurse Communication: Mobility status (steady BP) PT Visit Diagnosis: Unsteadiness on feet (R26.81)    Time: 3710-6269 PT Time Calculation (min) (ACUTE ONLY): 25 min   Charges:   PT Evaluation $PT Eval Moderate Complexity: 1 Mod PT Treatments $Therapeutic Activity: 8-22 mins        Abran Richard, PT Acute Rehab Services Pager [phone] [facility] [phone]    Karlton Lemon 04/11/2021, 4:31 PM

## 2021-04-11 NOTE — Evaluation (Signed)
Clinical/Bedside Swallow Evaluation Patient Details  Name: [name] MRN: [id number] Date of Birth: April 20, 1956  Today's Date: 04/11/2021 Time: SLP Start Time (ACUTE ONLY): 8756 SLP Stop Time (ACUTE ONLY): 1146 SLP Time Calculation (min) (ACUTE ONLY): 20 min  Past Medical History:  Past Medical History:  Diagnosis Date  . Anxiety   . Arthritis   . CAD (coronary artery disease)    a. 03/2013 NSTEMI/Cath: 100 RCA, EF 45%->Med Rx;  b. 12/2013 Cath/PCI: LAD 70m, 30d, D1 80, D2 60, LCX nl, RCA 50ost/p, 39m, 95d (2.5x33 Xience DES);  c. 05/2014 Lexi MV: EF 68%, no ischemia; d. stress echo 12/2015 no ischemia @ max exercise (did not achieve target)  . Carotid disease, bilateral ([place])    a. 08/2013 Carotid U/S: 1-39% bilat ICA stenosis.  . Chronic kidney disease    Chronic kidney infections. Takes daily preventative.  . Complication of anesthesia   . CVA (cerebral vascular accident) ([place])    a. 08/2013.  Marland Kitchen CVA (cerebral vascular accident) ([place])   . Decreased libido   . Depression   . GERD (gastroesophageal reflux disease)   . History of 2019 novel coronavirus disease (COVID-19) 07/2019  . History of recurrent UTIs   . Hypercholesteremia   . Hyperlipemia   . Hypertension   . Insomnia   . Ischemic cardiomyopathy    a. 03/2013 Echo: EF 30-35%, mod dil LA, mod-sev MR, mod TR;  b. 08/2013 Echo EF 60-65%, mild AI.  Marland Kitchen Menopause   . Migraines   . Myocardial infarction ([place])   . PONV (postoperative nausea and vomiting)    If anesthsia is given slowly, pt will not throw up, if given quickly, pt will have nausea and vomiting.  . Right lower quadrant pain   . Syncope and collapse   . Tobacco abuse   . Vaginal atrophy    Past Surgical History:  Past Surgical History:  Procedure Laterality Date  . CARDIAC CATHETERIZATION  01/01/2014  . CARDIAC CATHETERIZATION  03/16/2013  . CARDIAC CATHETERIZATION  12/2013  . CARDIAC CATHETERIZATION N/A 08/19/2016   Procedure: Left Heart Cath and Coronary  Angiography;  Surgeon: Minna Merritts, MD;  Location: [facility];  Service: Cardiovascular;  Laterality: N/A;  . CESAREAN SECTION WITH BILATERAL TUBAL LIGATION    . CORONARY ANGIOPLASTY WITH STENT PLACEMENT  01/01/2014   95% lesion with a drug eluting stent to the distal RCA.  Marland Kitchen ENDOMETRIAL ABLATION    . KNEE ARTHROSCOPY  11/27/2006   left knee   . OOPHORECTOMY    . TOTAL KNEE ARTHROPLASTY Left 10/06/2016   Procedure: TOTAL KNEE ARTHROPLASTY;  Surgeon: Corky Mull, MD;  Location: [facility];  Service: Orthopedics;  Laterality: Left;  . TOTAL KNEE ARTHROPLASTY Right 11/12/2020   Procedure: TOTAL KNEE ARTHROPLASTY;  Surgeon: Corky Mull, MD;  Location: [facility];  Service: Orthopedics;  Laterality: Right;  . TUBAL LIGATION     HPI:  65 yo female admitted with R thalamic ICH 2' HTN and small vessel disease PMH R BG infarct 08/2013 former smoker CAD w/p stennting migraines. PMH: MI, migraines, GERD, CVA 2014   Assessment / Plan / Recommendation Clinical Impression  Pt easily awoken for swallow evaluation which revealed mild left facial weakness, lingual asymmetryand strong cough. Her rate and volume of self feeding was appropriate. Lack of posterior dentition or facial weakness did not interfere with mastication of solid textures or bolus control/cohesion. She managed 2 oz of sequential sips water, cup and straw  sips. No concern with poor timing or laryngeal weakness from only subjective measure. Will determine if modifications are needed with follow up session. SLP Visit Diagnosis: Dysphagia, unspecified (R13.10)    Aspiration Risk  Mild aspiration risk    Diet Recommendation Regular;Thin liquid   Liquid Administration via: Cup;Straw Medication Administration: Whole meds with liquid Supervision: Patient able to self feed Compensations: Slow rate;Small sips/bites;Lingual sweep for clearance of pocketing Postural Changes: Seated upright at 90 degrees    Other  Recommendations  Oral Care Recommendations: Oral care BID   Follow up Recommendations  (TBD)      Frequency and Duration min 2x/week  2 weeks       Prognosis Prognosis for Safe Diet Advancement: Good      Swallow Study   General Date of Onset: 04/10/21 HPI: 65 yo female admitted with R thalamic ICH 2' HTN and small vessel disease PMH R BG infarct 08/2013 former smoker CAD w/p stennting migraines. PMH: MI, migraines, GERD, CVA 2014 Type of Study: Bedside Swallow Evaluation Previous Swallow Assessment:  (none) Diet Prior to this Study: NPO Temperature Spikes Noted: No Respiratory Status: Room air History of Recent Intubation: No Behavior/Cognition: Alert;Cooperative;Pleasant mood Oral Cavity Assessment: Within Functional Limits Oral Care Completed by SLP: No Oral Cavity - Dentition: Poor condition;Missing dentition Vision: Functional for self-feeding Self-Feeding Abilities: Able to feed self;Needs set up Patient Positioning: Upright in bed Baseline Vocal Quality: Normal Volitional Cough: Strong Volitional Swallow: Able to elicit    Oral/Motor/Sensory Function Overall Oral Motor/Sensory Function: Mild impairment Facial ROM: Reduced left;Suspected CN VII (facial) dysfunction Facial Symmetry: Abnormal symmetry left;Suspected CN VII (facial) dysfunction Facial Strength: Reduced left;Suspected CN VII (facial) dysfunction Lingual ROM: Within Functional Limits Lingual Symmetry: Abnormal symmetry left;Suspected CN XII (hypoglossal) dysfunction Lingual Strength: Within Functional Limits Velum: Within Functional Limits Mandible: Within Functional Limits   Ice Chips Ice chips: Not tested   Thin Liquid Thin Liquid: Within functional limits Presentation: Cup;Straw    Nectar Thick Nectar Thick Liquid: Not tested   Honey Thick Honey Thick Liquid: Not tested   Puree Puree: Within functional limits   Solid     Solid: Within functional limits      Houston Siren 04/11/2021,12:03 PM  Orbie Pyo Colvin Caroli.Ed Risk analyst [phone] Office [phone]

## 2021-04-11 NOTE — Progress Notes (Signed)
Patient admitted with right thalamic ICH from hypertension and small vessel ischemic disease.  Appears to have improved under neurology service.  Patient to be transferred to hospitalist service  Tomorrow.  Care will be assumed at 7 AM in the morning.

## 2021-04-11 NOTE — Progress Notes (Signed)
At 6AM patients blood pressure was 156/77. I notified doctor per orders of systolic pressure greater than 140. I checked in again at 630 AM and it was down to 140/75. Patient is stable. But only orientated to self.

## 2021-04-12 DIAGNOSIS — I6523 Occlusion and stenosis of bilateral carotid arteries: Secondary | ICD-10-CM

## 2021-04-12 MED ORDER — AMLODIPINE BESYLATE 5 MG PO TABS
5.0000 mg | ORAL_TABLET | Freq: Every day | ORAL | Status: DC
  Administered 2021-04-12 – 2021-04-16 (×5): 5 mg via ORAL
  Filled 2021-04-12 (×5): qty 1

## 2021-04-12 NOTE — Progress Notes (Signed)
[facility]                                                                              Patient Demographics  [name], is a 65 y.o. female, DOB - 1956-08-14, OMV:[id number]  Admit date - 04/10/2021   Admitting Physician Kerney Elbe, MD  Outpatient Primary MD for the patient is Baxter Hire, MD  Outpatient specialists:   LOS - 2  days   Medical records reviewed and are as summarized below:    No chief complaint on file.      Brief summary   Patient is a 65 year old female with history of CAD, bilateral carotid disease, CKD, prior history of CVA in 2014, depression, GERD, hypertension, hyperlipidemia presented as initial transfer from [facility] ICU admission with complaints of left-sided weakness and feeling off balance.  Patient reported that she was in her usual state of health and night before, took Ambien 10 mg, woke up on the morning around 4:30 AM with left-sided weakness, dizziness, difficulty with balance and falling to the left whenever she attempted to ambulate throughout the day.  No other focal neurological deficits. In ED, patient was noted to have elevated BP 184/102, heart rate 70.  Patient had mild left facial weakness, left arm and leg ataxia otherwise no obvious focal neurological deficits. Noncontrast CT showed acute right thalamic hemorrhage, 5 mm right-to-left midline shift. EDP had discussed with neurosurgery who recommended no surgical intervention. EDP discussed with neurology, Dr. Cheral Marker and patient was transferred to [facility]. Patient admitted under neurology service, underwent extensive work-up PT valuation recommended CIR.  [facility] assumed care on 04/12/2021   Assessment & Plan    Principal problem   ICH (intracerebral hemorrhage) ([place]) likely secondary to hypertensive emergency, small vessel disease -Presented with resultant left arm weakness and mild left facial droop. -CT head showed small right thalamic ICH.  Repeat CT showed  stable right thalamic ICH. 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 -MRA showed multifocal moderate stenosis of left PCA -2D echo showed EF of 55 to 60%, carotid Dopplers unremarkable -LDL 177, continue statin -Hemoglobin A1c 6.0 -Neurology following, not on antithrombotics due to [place] -Passed swallow testing on 6/3, started on regular diet -PT evaluation recommended CIR, consult placed  History of prior CVA -In 2014 and 2016 -Management as #1  Hypertensive emergency, history of prior CAD -Presented with BP of 184/102 with small ICH -Goal BP less than 160 -Started on triamterene HCTZ 37.5-25 mg daily, metoprolol 25 mg twice daily per outpatient dose -Continue labetalol as needed with parameters  GERD -Continue PPI  Anxiety -Continue fluoxetine   Code Status: Full CODE STATUS DVT Prophylaxis:  SCD's Start: 04/10/21 2021   Level of Care: Level of care: Progressive Family Communication: Discussed all imaging results, lab results, explained to the patient    Disposition Plan:     Status is: Inpatient  Remains inpatient appropriate because:Inpatient level of care appropriate due to severity of illness   Dispo: The patient is from: Home  Anticipated d/c is to: CIR              Patient currently is not medically stable to d/c.   Difficult to place patient No      Time Spent in minutes 35 minutes Procedures:  2D echo, MRI, MRA, carotid Dopplers  Consultants:   Admitted by neurology service on 6/2  Antimicrobials:   Anti-infectives (From admission, onward)   None          Medications  Scheduled Meds: . FLUoxetine  40 mg Oral Daily  . metoprolol tartrate  25 mg Oral BID  . pantoprazole  40 mg Oral QHS  . rosuvastatin  20 mg Oral Daily  . senna-docusate  1 tablet Oral BID  . triamterene-hydrochlorothiazide  1 tablet Oral Daily    Continuous Infusions: PRN Meds:.acetaminophen **OR** acetaminophen (TYLENOL) oral liquid 160 mg/5 mL **OR** acetaminophen, labetalol      Subjective:   Samani Deal was seen and examined today.  Currently no acute complaints, no headaches, nausea or vomiting.  Tolerating diet. Patient denies dizziness, chest pain, shortness of breath, abdominal pain, N/V/D/C.  No acute events overnight  Objective:   Vitals:   04/11/21 1952 04/11/21 2331 04/12/21 0359 04/12/21 0825  BP: (!) 126/91 (!) 155/81 (!) 160/84 (!) 146/71  Pulse: 68 69 60 66  Resp: 19 18 15 17   Temp: 98.7 F (37.1 C) 98.5 F (36.9 C)  98.2 F (36.8 C)  TempSrc: Oral Oral  Oral  SpO2: 92% 92% 93% 98%    Intake/Output Summary (Last 24 hours) at 04/12/2021 1028 Last data filed at 04/12/2021 6237 Gross per 24 hour  Intake 850 ml  Output 1625 ml  Net -775 ml     Wt Readings from Last 3 Encounters:  04/10/21 66.3 kg  01/27/21 65.3 kg  11/12/20 65.3 kg     Exam  General: Alert and oriented x 3, NAD, mild left facial droop  Cardiovascular: S1 S2 auscultated, no murmurs, RRR  Respiratory: Clear to auscultation bilaterally, no wheezing, rales or rhonchi  Gastrointestinal: Soft, nontender, nondistended, + bowel sounds  Ext: no pedal edema bilaterally  Neuro:  Strength 5/5 upper and lower extremities bilaterally  Musculoskeletal: No digital cyanosis, clubbing  Skin: No rashes  Psych: Normal affect and demeanor, alert and oriented x3    Data Reviewed:  I have personally reviewed following labs and imaging studies  Micro Results Recent Results (from the past 240 hour(s))  Resp Panel by RT-PCR (Flu A&B, Covid) Nasopharyngeal Swab     Status: None   Collection Time: 04/09/21 11:54 PM   Specimen: Nasopharyngeal Swab; Nasopharyngeal(NP) swabs in vial transport medium  Result Value Ref Range Status   SARS Coronavirus 2 by RT PCR NEGATIVE NEGATIVE Final    Comment: (NOTE) SARS-CoV-2 target nucleic acids are  NOT DETECTED.  The SARS-CoV-2 RNA is generally detectable in upper respiratory specimens during the acute phase of infection. The lowest concentration of SARS-CoV-2 viral copies this assay can detect is 138 copies/mL. A negative result does not preclude SARS-Cov-2 infection and should not be used as the sole basis for treatment or other patient management decisions. A negative result may occur with  improper specimen collection/handling, submission of specimen other than nasopharyngeal swab, presence of viral mutation(s) within the areas targeted by this assay, and inadequate number of viral copies(<138 copies/mL). A negative result must be combined with clinical observations, patient history, and epidemiological information. The expected result is Negative.  Fact Sheet for Patients:  EntrepreneurPulse.com.au  Fact Sheet for Healthcare Providers:  IncredibleEmployment.be  This test is no t yet approved or cleared by the [place] FDA and  has been authorized for detection and/or diagnosis of SARS-CoV-2 by FDA under an Emergency Use Authorization (EUA). This EUA will remain  in effect (meaning this test can be used) for the duration of the COVID-19 declaration under Section 564(b)(1) of the Act, 21 U.S.C.section 360bbb-3(b)(1), unless the authorization is terminated  or revoked sooner.       Influenza A by PCR NEGATIVE NEGATIVE Final   Influenza B by PCR NEGATIVE NEGATIVE Final    Comment: (NOTE) The Xpert Xpress SARS-CoV-2/FLU/RSV plus assay is intended as an aid in the diagnosis of influenza from Nasopharyngeal swab specimens and should not be used as a sole basis for treatment. Nasal washings and aspirates are unacceptable for Xpert Xpress SARS-CoV-2/FLU/RSV testing.  Fact Sheet for Patients: EntrepreneurPulse.com.au  Fact Sheet for Healthcare Providers: IncredibleEmployment.be  This test is not  yet approved or cleared by the [place] FDA and has been authorized for detection and/or diagnosis of SARS-CoV-2 by FDA under an Emergency Use Authorization (EUA). This EUA will remain in effect (meaning this test can be used) for the duration of the COVID-19 declaration under Section 564(b)(1) of the Act, 21 U.S.C. section 360bbb-3(b)(1), unless the authorization is terminated or revoked.  Performed at [facility], [address] [place], [place] [address]   MRSA PCR Screening     Status: None   Collection Time: 04/10/21  3:27 AM   Specimen: Nasopharyngeal  Result Value Ref Range Status   MRSA by PCR NEGATIVE NEGATIVE Final    Comment:        The GeneXpert MRSA Assay (FDA approved for NASAL specimens only), is one component of a comprehensive MRSA colonization surveillance program. It is not intended to diagnose MRSA infection nor to guide or monitor treatment for MRSA infections. Performed at [facility], [address] [place], [place] [address]   MRSA PCR Screening     Status: None   Collection Time: 04/10/21 10:36 PM   Specimen: Nasopharyngeal  Result Value Ref Range Status   MRSA by PCR NEGATIVE NEGATIVE Final    Comment:        The GeneXpert MRSA Assay (FDA approved for NASAL specimens only), is one component of a comprehensive MRSA colonization surveillance program. It is not intended to diagnose MRSA infection nor to guide or monitor treatment for MRSA infections. Performed at [facility], [place] [address], [place], [place] [address]     Radiology Reports CT HEAD WO CONTRAST  Result Date: 04/10/2021 CLINICAL DATA:  Intracranial hemorrhage follow up EXAM: CT HEAD WITHOUT CONTRAST TECHNIQUE: Contiguous axial images were obtained from the base of the skull through the vertex without intravenous contrast. COMPARISON:  04/10/2021 at 2:50 a.m. FINDINGS: Brain: Unchanged size of intraparenchymal hematoma centered in the right  thalamus. No new site of hemorrhage. There is periventricular hypoattenuation compatible with chronic microvascular disease. Mild generalized volume loss. Unchanged size and configuration of the ventricles. Chronic small vessel infarct of the left basal ganglia. Vascular: No hyperdense vessel or unexpected calcification. Skull: Normal. Negative for fracture or focal lesion. Sinuses/Orbits: No acute finding. Other: None IMPRESSION: Unchanged size of intraparenchymal hematoma centered in the right thalamus. Electronically Signed   By: Ulyses Jarred M.D.   On: 04/10/2021 23:15   CT HEAD WO CONTRAST  Result Date: 04/10/2021 CLINICAL DATA:  Intracranial hemorrhage follow-up EXAM: CT HEAD WITHOUT  CONTRAST TECHNIQUE: Contiguous axial images were obtained from the base of the skull through the vertex without intravenous contrast. COMPARISON:  04/09/2021 FINDINGS: Brain: Unchanged size of intraparenchymal hemorrhage in the right thalamus. Trace leftward midline shift is unchanged. Old left caudate infarct. There is periventricular hypoattenuation compatible with chronic microvascular disease. Vascular: No abnormal hyperdensity of the major intracranial arteries or dural venous sinuses. No intracranial atherosclerosis. Skull: The visualized skull base, calvarium and extracranial soft tissues are normal. Sinuses/Orbits: No fluid levels or advanced mucosal thickening of the visualized paranasal sinuses. No mastoid or middle ear effusion. The orbits are normal. IMPRESSION: Unchanged size of right thalamic intraparenchymal hemorrhage with trace leftward midline shift. Electronically Signed   By: Ulyses Jarred M.D.   On: 04/10/2021 03:12   CT HEAD WO CONTRAST  Result Date: 04/09/2021 CLINICAL DATA:  TIA. EXAM: CT HEAD WITHOUT CONTRAST TECHNIQUE: Contiguous axial images were obtained from the base of the skull through the vertex without intravenous contrast. COMPARISON:  05/18/2020 FINDINGS: Brain: There is hemorrhage within  the right thalamus measuring 1.8 x 1.2 x 1.3 cm. Surrounding vasogenic edema. 5 mm of associated right to left midline shift. No hydrocephalus. Old left basal ganglia lacunar infarct. Chronic small vessel disease throughout the deep white matter. Vascular: No hyperdense vessel or unexpected calcification. Skull: No acute calvarial abnormality. Sinuses/Orbits: Visualized paranasal sinuses and mastoids clear. Orbital soft tissues unremarkable. Other: None IMPRESSION: Acute right thalamic hemorrhage. 5 mm of right to left midline shift. Old left basal ganglia lacunar infarct. Critical Value/emergent results were called by telephone at the time of interpretation on 04/09/2021 at 10:38 pm to provider [facility] , who verbally acknowledged these results. Electronically Signed   By: Rolm Baptise M.D.   On: 04/09/2021 22:40   MR MRA HEAD WO CONTRAST  Result Date: 04/10/2021 CLINICAL DATA:  Intraparenchymal hemorrhage of the right thalamus EXAM: MRA HEAD WITHOUT CONTRAST TECHNIQUE: Angiographic images of the Circle of Willis were acquired using MRA technique without intravenous contrast. COMPARISON:  No pertinent prior exam. FINDINGS: POSTERIOR CIRCULATION: --Vertebral arteries: Normal --Inferior cerebellar arteries: Poor visualization right PICA. Normal left PICA. --Basilar artery: Normal. --Superior cerebellar arteries: Normal. --Posterior cerebral arteries: Multifocal moderate stenosis of the left PCA. ANTERIOR CIRCULATION: --Intracranial internal carotid arteries: Normal. --Anterior cerebral arteries (ACA): Normal. --Middle cerebral arteries (MCA): Normal. ANATOMIC VARIANTS: None IMPRESSION: 1. No proximal large vessel occlusion or high-grade stenosis. 2. Multifocal moderate stenosis of the left PCA. Electronically Signed   By: Ulyses Jarred M.D.   On: 04/10/2021 23:36   MR BRAIN WO CONTRAST  Result Date: 04/10/2021 CLINICAL DATA:  Intracranial hemorrhage EXAM: MRI HEAD WITHOUT CONTRAST TECHNIQUE: Multiplanar,  multiecho pulse sequences of the brain and surrounding structures were obtained without intravenous contrast. COMPARISON:  Correlation made with recent CT imaging.  MRI 2016 FINDINGS: Brain: Acute hemorrhage within the right thalamus is again identified with surrounding edema effacing the adjacent third ventricle. There is no intraventricular extension. No hydrocephalus. Chronic infarct of the left basal ganglia and adjacent white matter. Additional patchy and confluent areas of T2 hyperintensity in the supratentorial greater than pontine white matter are nonspecific but probably reflect moderate chronic microvascular ischemic changes. There is a focus of susceptibility in the left frontal subcortical white matter; adjacent linear susceptibility could reflect a developmental venous anomaly and therefore this may represent a cavernous malformation. There is no intracranial mass, noting that there is limited evaluation for underlying right thalamic lesion without contrast. There is no hydrocephalus or extra-axial fluid collection.  Vascular: Major vessel flow voids at the skull base are preserved. Skull and upper cervical spine: Normal marrow signal is preserved. Sinuses/Orbits: Paranasal sinuses are aerated. Orbits are unremarkable. Other: Sella is unremarkable.  Mastoid air cells are clear. IMPRESSION: Acute right thalamic hemorrhage with mild mass effect. No intraventricular extension. Moderate chronic microvascular ischemic changes. 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 Electronically Signed   By: Macy Mis M.D.   On: 04/10/2021 14:02   US Carotid Bilateral (at [facility] and [facility] only)  Result Date: 04/10/2021 CLINICAL DATA:  Hyperlipidemia, stroke symptoms EXAM: BILATERAL CAROTID DUPLEX ULTRASOUND TECHNIQUE: Pearline Cables scale imaging, color Doppler and duplex ultrasound were performed of bilateral carotid and vertebral arteries in the  neck. COMPARISON:  None. FINDINGS: Criteria: Quantification of carotid stenosis is based on velocity parameters that correlate the residual internal carotid diameter with NASCET-based stenosis levels, using the diameter of the distal internal carotid lumen as the denominator for stenosis measurement. The following velocity measurements were obtained: RIGHT ICA: 68/17 cm/sec CCA: 34/74 cm/sec SYSTOLIC ICA/CCA RATIO:  0.8 ECA: 90 cm/sec LEFT ICA: 64/10 cm/sec CCA: 25/95 cm/sec SYSTOLIC ICA/CCA RATIO:  0.8 ECA: 113 cm/sec RIGHT CAROTID ARTERY: Minor echogenic shadowing plaque formation. No hemodynamically significant right ICA stenosis, velocity elevation, or turbulent flow. Degree of narrowing less than 50%. RIGHT VERTEBRAL ARTERY:  Normal antegrade flow LEFT CAROTID ARTERY: Similar scattered minor echogenic plaque formation. No hemodynamically significant left ICA stenosis, velocity elevation, or turbulent flow. LEFT VERTEBRAL ARTERY:  Normal antegrade flow IMPRESSION: Bilateral carotid atherosclerosis. No hemodynamically significant ICA stenosis. Degree of narrowing less than 50% bilaterally by ultrasound criteria. Patent antegrade vertebral flow bilaterally Electronically Signed   By: Jerilynn Mages.  Shick M.D.   On: 04/10/2021 14:46   ECHOCARDIOGRAM COMPLETE  Result Date: 04/10/2021    ECHOCARDIOGRAM REPORT   Patient Name:   TAGEN BRETHAUER Date of Exam: 04/10/2021 Medical Rec #:  [id number]     Height:       62.0 in Accession #:    [id number]    Weight:       146.2 lb Date of Birth:  [birth year]     BSA:          1.673 m Patient Age:    41 years      BP:           114/60 mmHg Patient Gender: F             HR:           73 bpm. Exam Location:  [facility] Procedure: 2D Echo, Color Doppler and Cardiac Doppler Indications:     I63.9 Stroke  History:         Patient has prior history of Echocardiogram examinations.                  Stroke; Risk Factors:Current Smoker, Hypertension, Dyslipidemia                  and HCL.  Sonographer:      Charmayne Sheer RDCS (AE) Referring Phys:  [place] Diagnosing Phys: Kathlyn Sacramento MD  Sonographer Comments: Suboptimal apical window. Global longitudinal strain was attempted. IMPRESSIONS  1. Left ventricular ejection fraction, by estimation, is 55 to 60%. The left ventricle has normal function. The left ventricle has no regional wall motion abnormalities. Left ventricular diastolic parameters are consistent with Grade I diastolic dysfunction (impaired relaxation). The global longitudinal strain is normal.  2. Right ventricular systolic function is normal. The right ventricular size is normal. Tricuspid regurgitation signal is inadequate for assessing PA pressure.  3. Trivial pericardial effusion is present. The pericardial effusion is circumferential.  4. The mitral valve is normal in structure. No evidence of mitral valve regurgitation. No evidence of mitral stenosis.  5. The aortic valve is normal in structure. Aortic valve regurgitation is mild. Mild aortic valve sclerosis is present, with no evidence of aortic valve stenosis.  6. The inferior vena cava is normal in size with greater than 50% respiratory variability, suggesting right atrial pressure of 3 mmHg. FINDINGS  Left Ventricle: Left ventricular ejection fraction, by estimation, is 55 to 60%. The left ventricle has normal function. The left ventricle has no regional wall motion abnormalities. The global longitudinal strain is normal. The left ventricular internal cavity size was normal in size. There is no left ventricular hypertrophy. Left ventricular diastolic parameters are consistent with Grade I diastolic dysfunction (impaired relaxation). Right Ventricle: The right ventricular size is normal. No increase in right ventricular wall thickness. Right ventricular systolic function is normal. Tricuspid regurgitation signal is inadequate for assessing PA pressure. Left Atrium: Left atrial size was normal in size. Right Atrium: Right  atrial size was normal in size. Pericardium: Trivial pericardial effusion is present. The pericardial effusion is circumferential. Mitral Valve: The mitral valve is normal in structure. No evidence of mitral valve regurgitation. No evidence of mitral valve stenosis. MV peak gradient, 3.4 mmHg. The mean mitral valve gradient is 1.0 mmHg. Tricuspid Valve: The tricuspid valve is normal in structure. Tricuspid valve regurgitation is not demonstrated. No evidence of tricuspid stenosis. Aortic Valve: The aortic valve is normal in structure. Aortic valve regurgitation is mild. Mild aortic valve sclerosis is present, with no evidence of aortic valve stenosis. Aortic valve mean gradient measures 5.0 mmHg. Aortic valve peak gradient measures 11.3 mmHg. Aortic valve area, by VTI measures 1.61 cm. Pulmonic Valve: The pulmonic valve was normal in structure. Pulmonic valve regurgitation is mild. No evidence of pulmonic stenosis. Aorta: The aortic root is normal in size and structure. Venous: The inferior vena cava is normal in size with greater than 50% respiratory variability, suggesting right atrial pressure of 3 mmHg. IAS/Shunts: No atrial level shunt detected by color flow Doppler.  LEFT VENTRICLE PLAX 2D LVIDd:         4.60 cm  Diastology LVIDs:         3.10 cm  LV e' medial:    6.42 cm/s LV PW:         1.20 cm  LV E/e' medial:  9.2 LV IVS:        0.90 cm  LV e' lateral:   6.20 cm/s LVOT diam:     1.90 cm  LV E/e' lateral: 9.5 LV SV:         47 LV SV Index:   28 LVOT Area:     2.84 cm  RIGHT VENTRICLE RV Basal diam:  2.50 cm LEFT ATRIUM           Index       RIGHT ATRIUM           Index LA diam:      3.40 cm 2.03 cm/m  RA Area:     10.30 cm LA Vol (A2C): 40.5 ml 24.21 ml/m RA Volume:   17.80 ml  10.64 ml/m LA Vol (A4C): 41.2 ml 24.62 ml/m  AORTIC VALVE  PULMONIC VALVE AV Area (Vmax):    1.74 cm     PV Vmax:       1.26 m/s AV Area (Vmean):   1.75 cm     PV Vmean:      84.600 cm/s AV Area (VTI):      1.61 cm     PV VTI:        0.275 m AV Vmax:           168.00 cm/s  PV Peak grad:  6.4 mmHg AV Vmean:          101.000 cm/s PV Mean grad:  3.0 mmHg AV VTI:            0.292 m AV Peak Grad:      11.3 mmHg AV Mean Grad:      5.0 mmHg LVOT Vmax:         103.00 cm/s LVOT Vmean:        62.300 cm/s LVOT VTI:          0.166 m LVOT/AV VTI ratio: 0.57  AORTA Ao Root diam: 3.30 cm MITRAL VALVE MV Area (PHT): 3.60 cm    SHUNTS MV Area VTI:   1.89 cm    Systemic VTI:  0.17 m MV Peak grad:  3.4 mmHg    Systemic Diam: 1.90 cm MV Mean grad:  1.0 mmHg MV Vmax:       0.92 m/s MV Vmean:      52.7 cm/s MV Decel Time: 211 msec MV E velocity: 59.10 cm/s MV A velocity: 90.80 cm/s MV E/A ratio:  0.65 Kathlyn Sacramento MD Electronically signed by Kathlyn Sacramento MD Signature Date/Time: 04/10/2021/11:30:41 AM    Final     Lab Data:  CBC: Recent Labs  Lab 04/09/21 2218 04/10/21 0414 04/10/21 2109  WBC 11.2* 13.6* 10.4  NEUTROABS 7.7  --   --   HGB 13.1 13.1 12.1  HCT 38.5 38.0 36.3  MCV 90.2 89.2 91.0  PLT 273 288 403   Basic Metabolic Panel: Recent Labs  Lab 04/09/21 2218 04/10/21 0414 04/10/21 2109  NA 140 141 137  K 3.7 3.5 3.4*  CL 108 109 105  CO2 22 22 24   GLUCOSE 114* 127* 114*  BUN 18 23 15   CREATININE 0.84 0.94 0.90  CALCIUM 9.3 9.1 9.0  MG  --  1.9  --    GFR: Estimated Creatinine Clearance: 57.2 mL/min (by C-G formula based on SCr of 0.9 mg/dL). Liver Function Tests: Recent Labs  Lab 04/09/21 2218 04/10/21 2109  AST 24 26  ALT 18 19  ALKPHOS 91 82  BILITOT 0.7 1.2  PROT 7.7 6.5  ALBUMIN 3.9 3.4*   No results for input(s): LIPASE, AMYLASE in the last 168 hours. No results for input(s): AMMONIA in the last 168 hours. Coagulation Profile: Recent Labs  Lab 04/09/21 2218  INR 1.0   Cardiac Enzymes: No results for input(s): CKTOTAL, CKMB, CKMBINDEX, TROPONINI in the last 168 hours. BNP (last 3 results) No results for input(s): PROBNP in the last 8760 hours. HbA1C: Recent Labs     04/10/21 0414  HGBA1C 6.0*   CBG: Recent Labs  Lab 04/09/21 2251 04/10/21 0308 04/10/21 1520  GLUCAP 117* 142* 101*   Lipid Profile: Recent Labs    04/10/21 0414  CHOL 283*  HDL 42  LDLCALC 177*  TRIG 320*  CHOLHDL 6.7   Thyroid Function Tests: No results for input(s): TSH, T4TOTAL, FREET4, T3FREE, THYROIDAB in the last 72 hours.  Anemia Panel: No results for input(s): VITAMINB12, FOLATE, FERRITIN, TIBC, IRON, RETICCTPCT in the last 72 hours. Urine analysis:    Component Value Date/Time   COLORURINE YELLOW (A) 11/04/2020 1020   APPEARANCEUR HAZY (A) 11/04/2020 1020   LABSPEC 1.025 11/04/2020 1020   PHURINE 5.0 11/04/2020 1020   GLUCOSEU NEGATIVE 11/04/2020 1020   HGBUR NEGATIVE 11/04/2020 1020   BILIRUBINUR NEGATIVE 11/04/2020 1020   KETONESUR NEGATIVE 11/04/2020 1020   PROTEINUR NEGATIVE 11/04/2020 1020   UROBILINOGEN 0.2 02/18/2015 0410   NITRITE NEGATIVE 11/04/2020 1020   LEUKOCYTESUR NEGATIVE 11/04/2020 1020     Marilena Trevathan M.D. [facility] 04/12/2021, 10:28 AM  Available via Epic secure chat 7am-7pm After 7 pm, please refer to night coverage provider listed on amion.

## 2021-04-12 NOTE — Progress Notes (Addendum)
STROKE TEAM PROGRESS NOTE   SUBJECTIVE (INTERVAL HISTORY)  Overall her condition is stable. Pt awake alert orientated to place time and age. Still has left facial droop and left arm weakness. Completed Speech therapy cleared for regular solids and thin liquid . BP at goal, on labetalol PRN.     OBJECTIVE Temp:  [98 F (36.7 C)-98.7 F (37.1 C)] 98.2 F (36.8 C) (06/04 0825) Pulse Rate:  [60-69] 66 (06/04 0825) Cardiac Rhythm: Sinus bradycardia (06/04 0700) Resp:  [11-19] 17 (06/04 0825) BP: (123-179)/(71-160) 146/71 (06/04 0825) SpO2:  [92 %-98 %] 98 % (06/04 0825)  Recent Labs  Lab 04/09/21 2251 04/10/21 0308 04/10/21 1520  GLUCAP 117* 142* 101*   Recent Labs  Lab 04/09/21 2218 04/10/21 0414 04/10/21 2109  NA 140 141 137  K 3.7 3.5 3.4*  CL 108 109 105  CO2 22 22 24   GLUCOSE 114* 127* 114*  BUN 18 23 15   CREATININE 0.84 0.94 0.90  CALCIUM 9.3 9.1 9.0  MG  --  1.9  --    Recent Labs  Lab 04/09/21 2218 04/10/21 2109  AST 24 26  ALT 18 19  ALKPHOS 91 82  BILITOT 0.7 1.2  PROT 7.7 6.5  ALBUMIN 3.9 3.4*   Recent Labs  Lab 04/09/21 2218 04/10/21 0414 04/10/21 2109  WBC 11.2* 13.6* 10.4  NEUTROABS 7.7  --   --   HGB 13.1 13.1 12.1  HCT 38.5 38.0 36.3  MCV 90.2 89.2 91.0  PLT 273 288 277   No results for input(s): CKTOTAL, CKMB, CKMBINDEX, TROPONINI in the last 168 hours. Recent Labs    04/09/21 2218  LABPROT 13.4  INR 1.0   No results for input(s): COLORURINE, LABSPEC, PHURINE, GLUCOSEU, HGBUR, BILIRUBINUR, KETONESUR, PROTEINUR, UROBILINOGEN, NITRITE, LEUKOCYTESUR in the last 72 hours.  Invalid input(s): APPERANCEUR     Component Value Date/Time   CHOL 283 (H) 04/10/2021 0414   CHOL 192 05/15/2014 0500   TRIG 320 (H) 04/10/2021 0414   TRIG 155 05/15/2014 0500   HDL 42 04/10/2021 0414   HDL 51 05/15/2014 0500   CHOLHDL 6.7 04/10/2021 0414   VLDL 64 (H) 04/10/2021 0414   VLDL 31 05/15/2014 0500   LDLCALC 177 (H) 04/10/2021 0414   LDLCALC  110 (H) 05/15/2014 0500   Lab Results  Component Value Date   HGBA1C 6.0 (H) 04/10/2021      Component Value Date/Time   LABOPIA NONE DETECTED 02/18/2015 0410   COCAINSCRNUR NONE DETECTED 02/18/2015 0410   LABBENZ NONE DETECTED 02/18/2015 0410   AMPHETMU NONE DETECTED 02/18/2015 0410   THCU NONE DETECTED 02/18/2015 0410   LABBARB NONE DETECTED 02/18/2015 0410    No results for input(s): ETH in the last 168 hours.  I have personally reviewed the radiological images below and agree with the radiology interpretations.  CT HEAD WO CONTRAST  Result Date: 04/10/2021 CLINICAL DATA:  Intracranial hemorrhage follow up EXAM: CT HEAD WITHOUT CONTRAST TECHNIQUE: Contiguous axial images were obtained from the base of the skull through the vertex without intravenous contrast. COMPARISON:  04/10/2021 at 2:50 a.m. FINDINGS: Brain: Unchanged size of intraparenchymal hematoma centered in the right thalamus. No new site of hemorrhage. There is periventricular hypoattenuation compatible with chronic microvascular disease. Mild generalized volume loss. Unchanged size and configuration of the ventricles. Chronic small vessel infarct of the left basal ganglia. Vascular: No hyperdense vessel or unexpected calcification. Skull: Normal. Negative for fracture or focal lesion. Sinuses/Orbits: No acute finding. Other: None IMPRESSION: Unchanged size  of intraparenchymal hematoma centered in the right thalamus. Electronically Signed   By: Ulyses Jarred M.D.   On: 04/10/2021 23:15   CT HEAD WO CONTRAST  Result Date: 04/10/2021 CLINICAL DATA:  Intracranial hemorrhage follow-up EXAM: CT HEAD WITHOUT CONTRAST TECHNIQUE: Contiguous axial images were obtained from the base of the skull through the vertex without intravenous contrast. COMPARISON:  04/09/2021 FINDINGS: Brain: Unchanged size of intraparenchymal hemorrhage in the right thalamus. Trace leftward midline shift is unchanged. Old left caudate infarct. There is  periventricular hypoattenuation compatible with chronic microvascular disease. Vascular: No abnormal hyperdensity of the major intracranial arteries or dural venous sinuses. No intracranial atherosclerosis. Skull: The visualized skull base, calvarium and extracranial soft tissues are normal. Sinuses/Orbits: No fluid levels or advanced mucosal thickening of the visualized paranasal sinuses. No mastoid or middle ear effusion. The orbits are normal. IMPRESSION: Unchanged size of right thalamic intraparenchymal hemorrhage with trace leftward midline shift. Electronically Signed   By: Ulyses Jarred M.D.   On: 04/10/2021 03:12   CT HEAD WO CONTRAST  Result Date: 04/09/2021 CLINICAL DATA:  TIA. EXAM: CT HEAD WITHOUT CONTRAST TECHNIQUE: Contiguous axial images were obtained from the base of the skull through the vertex without intravenous contrast. COMPARISON:  05/18/2020 FINDINGS: Brain: There is hemorrhage within the right thalamus measuring 1.8 x 1.2 x 1.3 cm. Surrounding vasogenic edema. 5 mm of associated right to left midline shift. No hydrocephalus. Old left basal ganglia lacunar infarct. Chronic small vessel disease throughout the deep white matter. Vascular: No hyperdense vessel or unexpected calcification. Skull: No acute calvarial abnormality. Sinuses/Orbits: Visualized paranasal sinuses and mastoids clear. Orbital soft tissues unremarkable. Other: None IMPRESSION: Acute right thalamic hemorrhage. 5 mm of right to left midline shift. Old left basal ganglia lacunar infarct. Critical Value/emergent results were called by telephone at the time of interpretation on 04/09/2021 at 10:38 pm to provider [facility] , who verbally acknowledged these results. Electronically Signed   By: Rolm Baptise M.D.   On: 04/09/2021 22:40   MR MRA HEAD WO CONTRAST  Result Date: 04/10/2021 CLINICAL DATA:  Intraparenchymal hemorrhage of the right thalamus EXAM: MRA HEAD WITHOUT CONTRAST TECHNIQUE: Angiographic images of the [place] were acquired using MRA technique without intravenous contrast. COMPARISON:  No pertinent prior exam. FINDINGS: POSTERIOR CIRCULATION: --Vertebral arteries: Normal --Inferior cerebellar arteries: Poor visualization right PICA. Normal left PICA. --Basilar artery: Normal. --Superior cerebellar arteries: Normal. --Posterior cerebral arteries: Multifocal moderate stenosis of the left PCA. ANTERIOR CIRCULATION: --Intracranial internal carotid arteries: Normal. --Anterior cerebral arteries (ACA): Normal. --Middle cerebral arteries (MCA): Normal. ANATOMIC VARIANTS: None IMPRESSION: 1. No proximal large vessel occlusion or high-grade stenosis. 2. Multifocal moderate stenosis of the left PCA. Electronically Signed   By: Ulyses Jarred M.D.   On: 04/10/2021 23:36   MR BRAIN WO CONTRAST  Result Date: 04/10/2021 CLINICAL DATA:  Intracranial hemorrhage EXAM: MRI HEAD WITHOUT CONTRAST TECHNIQUE: Multiplanar, multiecho pulse sequences of the brain and surrounding structures were obtained without intravenous contrast. COMPARISON:  Correlation made with recent CT imaging.  MRI 2016 FINDINGS: Brain: Acute hemorrhage within the right thalamus is again identified with surrounding edema effacing the adjacent third ventricle. There is no intraventricular extension. No hydrocephalus. Chronic infarct of the left basal ganglia and adjacent white matter. Additional patchy and confluent areas of T2 hyperintensity in the supratentorial greater than pontine white matter are nonspecific but probably reflect moderate chronic microvascular ischemic changes. There is a focus of susceptibility in the left frontal subcortical white matter; adjacent  linear susceptibility could reflect a developmental venous anomaly and therefore this may represent a cavernous malformation. There is no intracranial mass, noting that there is limited evaluation for underlying right thalamic lesion without contrast. There is no hydrocephalus or extra-axial fluid  collection. Vascular: Major vessel flow voids at the skull base are preserved. Skull and upper cervical spine: Normal marrow signal is preserved. Sinuses/Orbits: Paranasal sinuses are aerated. Orbits are unremarkable. Other: Sella is unremarkable.  Mastoid air cells are clear. IMPRESSION: Acute right thalamic hemorrhage with mild mass effect. No intraventricular extension. Moderate chronic microvascular ischemic changes. 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 Electronically Signed   By: Macy Mis M.D.   On: 04/10/2021 14:02   US Carotid Bilateral (at [facility] and [facility] only)  Result Date: 04/10/2021 CLINICAL DATA:  Hyperlipidemia, stroke symptoms EXAM: BILATERAL CAROTID DUPLEX ULTRASOUND TECHNIQUE: Pearline Cables scale imaging, color Doppler and duplex ultrasound were performed of bilateral carotid and vertebral arteries in the neck. COMPARISON:  None. FINDINGS: Criteria: Quantification of carotid stenosis is based on velocity parameters that correlate the residual internal carotid diameter with NASCET-based stenosis levels, using the diameter of the distal internal carotid lumen as the denominator for stenosis measurement. The following velocity measurements were obtained: RIGHT ICA: 68/17 cm/sec CCA: 33/82 cm/sec SYSTOLIC ICA/CCA RATIO:  0.8 ECA: 90 cm/sec LEFT ICA: 64/10 cm/sec CCA: 50/53 cm/sec SYSTOLIC ICA/CCA RATIO:  0.8 ECA: 113 cm/sec RIGHT CAROTID ARTERY: Minor echogenic shadowing plaque formation. No hemodynamically significant right ICA stenosis, velocity elevation, or turbulent flow. Degree of narrowing less than 50%. RIGHT VERTEBRAL ARTERY:  Normal antegrade flow LEFT CAROTID ARTERY: Similar scattered minor echogenic plaque formation. No hemodynamically significant left ICA stenosis, velocity elevation, or turbulent flow. LEFT VERTEBRAL ARTERY:  Normal antegrade flow IMPRESSION: Bilateral carotid atherosclerosis. No hemodynamically  significant ICA stenosis. Degree of narrowing less than 50% bilaterally by ultrasound criteria. Patent antegrade vertebral flow bilaterally Electronically Signed   By: Jerilynn Mages.  Shick M.D.   On: 04/10/2021 14:46   ECHOCARDIOGRAM COMPLETE  Result Date: 04/10/2021    ECHOCARDIOGRAM REPORT   Patient Name:   [name] Date of Exam: 04/10/2021 Medical Rec #:  [id number]     Height:       62.0 in Accession #:    [id number]    Weight:       146.2 lb Date of Birth:  1956-01-14     BSA:          1.673 m Patient Age:    36 years      BP:           114/60 mmHg Patient Gender: F             HR:           73 bpm. Exam Location:  [facility] Procedure: 2D Echo, Color Doppler and Cardiac Doppler Indications:     I63.9 Stroke  History:         Patient has prior history of Echocardiogram examinations.                  Stroke; Risk Factors:Current Smoker, Hypertension, Dyslipidemia                  and HCL.  Sonographer:     Charmayne Sheer RDCS (AE) Referring Phys:  [place] Diagnosing Phys: Kathlyn Sacramento MD  Sonographer Comments: Suboptimal apical window. Global longitudinal strain was attempted. IMPRESSIONS  1. Left ventricular ejection  fraction, by estimation, is 55 to 60%. The left ventricle has normal function. The left ventricle has no regional wall motion abnormalities. Left ventricular diastolic parameters are consistent with Grade I diastolic dysfunction (impaired relaxation). The global longitudinal strain is normal.  2. Right ventricular systolic function is normal. The right ventricular size is normal. Tricuspid regurgitation signal is inadequate for assessing PA pressure.  3. Trivial pericardial effusion is present. The pericardial effusion is circumferential.  4. The mitral valve is normal in structure. No evidence of mitral valve regurgitation. No evidence of mitral stenosis.  5. The aortic valve is normal in structure. Aortic valve regurgitation is mild. Mild aortic valve sclerosis is present, with no  evidence of aortic valve stenosis.  6. The inferior vena cava is normal in size with greater than 50% respiratory variability, suggesting right atrial pressure of 3 mmHg. FINDINGS  Left Ventricle: Left ventricular ejection fraction, by estimation, is 55 to 60%. The left ventricle has normal function. The left ventricle has no regional wall motion abnormalities. The global longitudinal strain is normal. The left ventricular internal cavity size was normal in size. There is no left ventricular hypertrophy. Left ventricular diastolic parameters are consistent with Grade I diastolic dysfunction (impaired relaxation). Right Ventricle: The right ventricular size is normal. No increase in right ventricular wall thickness. Right ventricular systolic function is normal. Tricuspid regurgitation signal is inadequate for assessing PA pressure. Left Atrium: Left atrial size was normal in size. Right Atrium: Right atrial size was normal in size. Pericardium: Trivial pericardial effusion is present. The pericardial effusion is circumferential. Mitral Valve: The mitral valve is normal in structure. No evidence of mitral valve regurgitation. No evidence of mitral valve stenosis. MV peak gradient, 3.4 mmHg. The mean mitral valve gradient is 1.0 mmHg. Tricuspid Valve: The tricuspid valve is normal in structure. Tricuspid valve regurgitation is not demonstrated. No evidence of tricuspid stenosis. Aortic Valve: The aortic valve is normal in structure. Aortic valve regurgitation is mild. Mild aortic valve sclerosis is present, with no evidence of aortic valve stenosis. Aortic valve mean gradient measures 5.0 mmHg. Aortic valve peak gradient measures 11.3 mmHg. Aortic valve area, by VTI measures 1.61 cm. Pulmonic Valve: The pulmonic valve was normal in structure. Pulmonic valve regurgitation is mild. No evidence of pulmonic stenosis. Aorta: The aortic root is normal in size and structure. Venous: The inferior vena cava is normal in size  with greater than 50% respiratory variability, suggesting right atrial pressure of 3 mmHg. IAS/Shunts: No atrial level shunt detected by color flow Doppler.  LEFT VENTRICLE PLAX 2D LVIDd:         4.60 cm  Diastology LVIDs:         3.10 cm  LV e' medial:    6.42 cm/s LV PW:         1.20 cm  LV E/e' medial:  9.2 LV IVS:        0.90 cm  LV e' lateral:   6.20 cm/s LVOT diam:     1.90 cm  LV E/e' lateral: 9.5 LV SV:         47 LV SV Index:   28 LVOT Area:     2.84 cm  RIGHT VENTRICLE RV Basal diam:  2.50 cm LEFT ATRIUM           Index       RIGHT ATRIUM           Index LA diam:      3.40 cm 2.03 cm/m  RA Area:     10.30 cm LA Vol (A2C): 40.5 ml 24.21 ml/m RA Volume:   17.80 ml  10.64 ml/m LA Vol (A4C): 41.2 ml 24.62 ml/m  AORTIC VALVE                    PULMONIC VALVE AV Area (Vmax):    1.74 cm     PV Vmax:       1.26 m/s AV Area (Vmean):   1.75 cm     PV Vmean:      84.600 cm/s AV Area (VTI):     1.61 cm     PV VTI:        0.275 m AV Vmax:           168.00 cm/s  PV Peak grad:  6.4 mmHg AV Vmean:          101.000 cm/s PV Mean grad:  3.0 mmHg AV VTI:            0.292 m AV Peak Grad:      11.3 mmHg AV Mean Grad:      5.0 mmHg LVOT Vmax:         103.00 cm/s LVOT Vmean:        62.300 cm/s LVOT VTI:          0.166 m LVOT/AV VTI ratio: 0.57  AORTA Ao Root diam: 3.30 cm MITRAL VALVE MV Area (PHT): 3.60 cm    SHUNTS MV Area VTI:   1.89 cm    Systemic VTI:  0.17 m MV Peak grad:  3.4 mmHg    Systemic Diam: 1.90 cm MV Mean grad:  1.0 mmHg MV Vmax:       0.92 m/s MV Vmean:      52.7 cm/s MV Decel Time: 211 msec MV E velocity: 59.10 cm/s MV A velocity: 90.80 cm/s MV E/A ratio:  0.65 Kathlyn Sacramento MD Electronically signed by Kathlyn Sacramento MD Signature Date/Time: 04/10/2021/11:30:41 AM    Final     PHYSICAL EXAM  Temp:  [98 F (36.7 C)-98.7 F (37.1 C)] 98.2 F (36.8 C) (06/04 0825) Pulse Rate:  [60-69] 66 (06/04 0825) Resp:  [11-19] 17 (06/04 0825) BP: (123-179)/(71-160) 146/71 (06/04 0825) SpO2:  [92 %-98 %] 98  % (06/04 0825)  General - Well nourished, well developed, in no apparent distress.  Ophthalmologic - fundi not visualized due to noncooperation.  Cardiovascular - Regular rhythm and rate.  Mental Status -  Level of arousal and orientation to time, place, and person were intact. Language including expression, repetition, comprehension was assessed and found intact. Naming 2/4 Fund of Knowledge was assessed and was intact.  Cranial Nerves II - XII - II - Visual field intact OU. III, IV, VI - Extraocular movements intact. V - Facial sensation intact bilaterally. VII - moderate  left facial droop. VIII - Hearing & vestibular intact bilaterally. X - Palate elevates symmetrically. XI - Chin turning & shoulder shrug intact bilaterally. XII - Tongue protrusion intact.  Motor Strength - The patient's strength was normal in RUE and BLEs. however left UE 5/5 finger grip and bicep but 4/5 tricep and deltoid, slight pronator drift. Bulk was normal and fasciculations were absent.   Motor Tone - Muscle tone was assessed at the neck and appendages and was normal.  Reflexes - The patient's reflexes were symmetrical in all extremities and she had no pathological reflexes.  Sensory - Light touch, temperature/pinprick were assessed and were symmetrical.  Coordination - The patient had normal movements in the hands with no ataxia or dysmetria.  Tremor was absent.  Gait and Station - deferred.   ASSESSMENT/PLAN [name] is a 65 y.o. female with history of HTN, HLD, CAD s/p stent 2015, stroke in 2014, former smoker, migraine admitted for left sided weakness, fall, imbalance. No tPA given due to [place].    ICH:  right thalamic ICH secondary to HTN and small vessel disease  Resultant left arm weakness mild left facial droop  CT head small right thalamic ICH  Repeat CT stable right thalamic ICH  MRI  Stable right thalamic ICH, possible occult cavernous malformation of the left frontal  lobe with associated developmental venous anomaly.  MRA Multifocal moderate stenosis of the left PCA.  Carotid Doppler  Unremarkable   2D Echo  EF 55-60%  LDL 177  HgbA1c 6.0  SCDs for VTE prophylaxis  No antithrombotic prior to admission, now on No antithrombotic due to [place]. Will recommend to hold for 2 weeks and repeat CT head if stable and BP is well controlled then start ASA for secondary stroke prevention  Ongoing aggressive stroke risk factor management  Therapy recommendations:  CIR   Disposition:  Pending   Hx of stroke  08/2013 right BG infarct without residue  02/2015 right sided weakness, facial droop, slurry speech with low BP. MRI negative for stroke. MRA and CUS neg. EF 55-60%. A1C 6.0 and LDL 69, discharged with DAPT and lipitor 40  Hypertension . Stable . BP goal < 160 . On labetalol PRN . Restart home BP meds  Long term BP goal normotensive  Hyperlipidemia  Home meds:  none   LDL 177, goal < 70  Rosovastatin 20 mg qhs  Continue statin at discharge  Dysphagia   Cleared by SLP  Other Stroke Risk Factors  Formal Cigarette smoker, quit for several months now  Coronary artery disease s/p stenting  Migraines  Other Active Problems  Hypokalemia K 3.4 - will supplement via po or cortrak  Hospital day # 2  I spent  35 minutes in total face-to-face time with the patient, more than 50% of which was spent in counseling and coordination of care, reviewing test results, images and medication, and discussing the diagnosis, treatment plan and potential prognosis. This patient's care requiresreview of multiple databases, neurological assessment, discussion with family, other specialists and medical decision making of high complexity.   [facility] 04/12/2021 9:55 AM    To contact Stroke Continuity provider, please refer to [email or web address] After hours, contact General Neurology

## 2021-04-12 NOTE — Evaluation (Signed)
Speech Language Pathology Evaluation Patient Details Name: [name] MRN: [id number] DOB: 13-Feb-1956 Today's Date: 04/12/2021 Time: 7353-2992 SLP Time Calculation (min) (ACUTE ONLY): 15 min  Problem List:  Patient Active Problem List   Diagnosis Date Noted  . Thalamic hemorrhage ([place]) 04/10/2021  . ICH (intracerebral hemorrhage) ([place]) 04/10/2021  . Pelvic pain-right lower quadrant 07/14/2018  . Right lower quadrant abdominal pain 06/30/2018  . Bilateral carotid artery stenosis 03/12/2018  . Chronic systolic CHF (congestive heart failure) ([place]) 03/12/2018  . Status post total knee replacement using cement, left 10/06/2016  . History of coronary artery stent placement   . Pure hypercholesterolemia   . Recurrent UTI 02/11/2016  . Menopause 02/11/2016  . Angina pectoris ([place]) 01/07/2016  . Syncope 07/11/2015  . Orthostatic hypotension 02/19/2015  . Dehydration 02/19/2015  . Hypokalemia 02/19/2015  . Stroke ([place]) 02/18/2015  . Tobacco abuse 02/18/2015  . History of stroke 07/18/2014  . Vertigo 07/18/2014  . Bilateral wrist pain 07/18/2014  . Atypical chest pain 06/05/2014  . Coronary artery disease of native artery of native heart with stable angina pectoris ([place]) 01/19/2014  . Carotid arterial disease ([place]) 01/19/2014  . Depression 01/16/2014  . Memory loss 01/16/2014  . Right sided weakness 08/10/2013  . Essential hypertension 08/10/2013  . Mixed hyperlipidemia 08/10/2013  . History of MI (myocardial infarction) 08/10/2013  . Stroke, acute, embolic ([place]) 42/68/3419   Past Medical History:  Past Medical History:  Diagnosis Date  . Anxiety   . Arthritis   . CAD (coronary artery disease)    a. 03/2013 NSTEMI/Cath: 100 RCA, EF 45%->Med Rx;  b. 12/2013 Cath/PCI: LAD 77m, 30d, D1 80, D2 60, LCX nl, RCA 50ost/p, 58m, 95d (2.5x33 Xience DES);  c. 05/2014 Lexi MV: EF 68%, no ischemia; d. stress echo 12/2015 no ischemia @ max exercise (did not achieve target)  . Carotid disease,  bilateral ([place])    a. 08/2013 Carotid U/S: 1-39% bilat ICA stenosis.  . Chronic kidney disease    Chronic kidney infections. Takes daily preventative.  . Complication of anesthesia   . CVA (cerebral vascular accident) ([place])    a. 08/2013.  Marland Kitchen CVA (cerebral vascular accident) ([place])   . Decreased libido   . Depression   . GERD (gastroesophageal reflux disease)   . History of 2019 novel coronavirus disease (COVID-19) 07/2019  . History of recurrent UTIs   . Hypercholesteremia   . Hyperlipemia   . Hypertension   . Insomnia   . Ischemic cardiomyopathy    a. 03/2013 Echo: EF 30-35%, mod dil LA, mod-sev MR, mod TR;  b. 08/2013 Echo EF 60-65%, mild AI.  Marland Kitchen Menopause   . Migraines   . Myocardial infarction ([place])   . PONV (postoperative nausea and vomiting)    If anesthsia is given slowly, pt will not throw up, if given quickly, pt will have nausea and vomiting.  . Right lower quadrant pain   . Syncope and collapse   . Tobacco abuse   . Vaginal atrophy    Past Surgical History:  Past Surgical History:  Procedure Laterality Date  . CARDIAC CATHETERIZATION  01/01/2014  . CARDIAC CATHETERIZATION  03/16/2013  . CARDIAC CATHETERIZATION  12/2013  . CARDIAC CATHETERIZATION N/A 08/19/2016   Procedure: Left Heart Cath and Coronary Angiography;  Surgeon: Minna Merritts, MD;  Location: [facility];  Service: Cardiovascular;  Laterality: N/A;  . CESAREAN SECTION WITH BILATERAL TUBAL LIGATION    . CORONARY ANGIOPLASTY WITH STENT PLACEMENT  01/01/2014  95% lesion with a drug eluting stent to the distal RCA.  Marland Kitchen ENDOMETRIAL ABLATION    . KNEE ARTHROSCOPY  11/27/2006   left knee   . OOPHORECTOMY    . TOTAL KNEE ARTHROPLASTY Left 10/06/2016   Procedure: TOTAL KNEE ARTHROPLASTY;  Surgeon: Corky Mull, MD;  Location: [facility] ORS;  Service: Orthopedics;  Laterality: Left;  . TOTAL KNEE ARTHROPLASTY Right 11/12/2020   Procedure: TOTAL KNEE ARTHROPLASTY;  Surgeon: Corky Mull, MD;  Location: [facility]  ORS;  Service: Orthopedics;  Laterality: Right;  . TUBAL LIGATION     HPI:  65 yo female admitted with R thalamic ICH 2' HTN and small vessel disease PMH R BG infarct 08/2013 former smoker CAD w/p stenting migraines. PMH: MI, migraines, GERD, CVA 2014 with a hx of cognitive deficits in the areas of short-term memory, attention, and language repetition.   Assessment / Plan / Recommendation Clinical Impression  Pt was seen for a cognitive-linguistic evaluation in the setting of a R thalamic ICH.  Pt was lethargic, but agreeable to this evaluation.  She reported that she lives alone and that she is independent with IADLs at baseline including medication and financial management.  Pt has a hx of cognitive deficits documented in 2014; however difficult to establish a current baseline given that no family or friends were present for this evaluation. Pt presents with a cognitive impairment c/b deficits in short-term memory, focused/sustained attention, problem solving, safety judgement, orientation and executive functioning.  She was oriented to self, place, and situation, but she was not oriented to the year, stating that it was 2014.  Slight delay in verbal response time; however, suspect that this is secondary to attention and possibly lethargy vs a language deficit.  Expressive and receptive language were grossly functional and no dysarthria was observed.  Recommend continued ST at [facility] following discharge.  Additionally recommend assistance with IALDs, particularly medication and financial management.  Pt was educated regarding results and recommendations, and she verbalized understanding with teach back.    SLP Assessment  SLP Recommendation/Assessment: Patient needs continued Speech Lanaguage Pathology Services SLP Visit Diagnosis: Cognitive communication deficit (R41.841)    Follow Up Recommendations  Inpatient Rehab    Frequency and Duration min 2x/week  2 weeks      SLP Evaluation Cognition   Overall Cognitive Status: No family/caregiver present to determine baseline cognitive functioning Arousal/Alertness: Lethargic Orientation Level: Oriented to person;Oriented to place;Oriented to situation;Disoriented to time Attention: Focused;Sustained Focused Attention: Impaired Focused Attention Impairment: Verbal basic Sustained Attention: Impaired Sustained Attention Impairment: Verbal basic Immediate Memory Recall: Sock;Blue;Bed Memory Recall Sock: Not able to recall Memory Recall Blue: With Cue Memory Recall Bed: Not able to recall Awareness: Impaired Awareness Impairment: Emergent impairment Problem Solving: Impaired Problem Solving Impairment: Verbal complex Executive Function: Sequencing;Organizing Sequencing: Impaired Sequencing Impairment: Functional complex Organizing: Impaired Organizing Impairment: Functional complex Safety/Judgment: Impaired       Comprehension  Auditory Comprehension Overall Auditory Comprehension: Appears within functional limits for tasks assessed    Expression Expression Primary Mode of Expression: Verbal Verbal Expression Overall Verbal Expression: Appears within functional limits for tasks assessed Written Expression Dominant Hand: Right   Oral / Motor  Oral Motor/Sensory Function Overall Oral Motor/Sensory Function: Mild impairment Facial ROM: Reduced left;Suspected CN VII (facial) dysfunction Facial Symmetry: Abnormal symmetry left;Suspected CN VII (facial) dysfunction Facial Strength: Reduced left;Suspected CN VII (facial) dysfunction Lingual ROM: Within Functional Limits Lingual Symmetry: Abnormal symmetry left;Suspected CN XII (hypoglossal) dysfunction Lingual Strength: Within Functional Limits Motor  Speech Overall Motor Speech: Appears within functional limits for tasks assessed   GO                   [name]., M.S., [place] Office: [phone]  [place] 04/12/2021, 10:46 AM

## 2021-04-12 NOTE — Progress Notes (Signed)
  Speech Language Pathology Treatment: Dysphagia  Patient Details Name: [name] MRN: [id number] DOB: 01/25/1956 Today's Date: 04/12/2021 Time: 9450-3888 SLP Time Calculation (min) (ACUTE ONLY): 8 min  Assessment / Plan / Recommendation Clinical Impression  Pt was seen for diagnostic treatment targeting diet tolerance.  Pt was lethargic, but agreeable to this tx session.  RN and pt reported that the pt has been tolerating her current diet and whole meds with liquid without overt difficulty.  Pt was seen with trials of thin liquid and regular solids that she fed herself independently.  Mastication of regular solids was mildly prolonged; however, suspect that this was secondary to lethargy.  No oral residue was observed, and no overt s/sx of aspiration were observed with regular solids or consecutive sips of thin liquid.  Pt was re-educated regarding safe swallow strategies and she verbalized understanding.  Recommend continuation of regular solids and thin liquids with medication administered whole with liquid.  No further skilled ST is warranted targeting dysphagia.  SLP will continue to f/u for tx targeting cognitive deficits.    HPI HPI: 65 yo female admitted with R thalamic ICH 2' HTN and small vessel disease PMH R BG infarct 08/2013 former smoker CAD w/p stennting migraines. PMH: MI, migraines, GERD, CVA 2014 with a hx of cognitive deficits in the areas of short-term memory, attention, and language repetition.      SLP Plan  All goals met  Patient needs continued Speech Lanaguage Pathology Services    Recommendations  Diet recommendations: Regular;Thin liquid Liquids provided via: Cup;Straw Medication Administration: Whole meds with liquid Supervision: Patient able to self feed Compensations: Slow rate;Small sips/bites;Lingual sweep for clearance of pocketing Postural Changes and/or Swallow Maneuvers: Seated upright 90 degrees                Oral Care Recommendations: Oral  care BID Follow up Recommendations: Inpatient Rehab SLP Visit Diagnosis: Dysphagia, unspecified (R13.10) Plan: All goals met       GO               [name]., M.S., [place] [facility] Office: [phone]  [place] 04/12/2021, 10:55 AM

## 2021-04-12 NOTE — Progress Notes (Signed)
Occupational Therapy Treatment Patient Details Name: [name] MRN: [id number] DOB: Dec 23, 1955 Today's Date: 04/12/2021    History of present illness Pt is 65 yo female admitted with R thalamic ICH 2' HTN and small vessel disease admitted 04/10/21. PMH R BG infarct 08/2013 former smoker CAD w/p stennting migraines   OT comments  Pt. Seen for skilled OT treatment session.  Agreeable to participation. Flat affect and some word finding difficulty noted during conversation.  Upon standing states "im not dizzy or anything, I just feel like I want to lean this way" (pointing to her L side".  Able to complete seated grooming and light LB dressing task along with short distance ambulation from eob to recliner.  Cues for rw management and safety.  Remains excellent candidate for continued therapies at CIR unit for ST/PT/OT.    BP: SUPINE: 778/24-23 SIT: 113/77-88 STAND: 110/83-93   Follow Up Recommendations  CIR    Equipment Recommendations  3 in 1 bedside commode    Recommendations for Other Services Rehab consult    Precautions / Restrictions Precautions Precautions: Fall Precaution Comments: SBP < 160 Restrictions Weight Bearing Restrictions: No       Mobility Bed Mobility Overal bed mobility: Needs Assistance Bed Mobility: Sidelying to Sit   Sidelying to sit: Supervision       General bed mobility comments: able to come to sitting from the Right side    Transfers Overall transfer level: Needs assistance Equipment used: Rolling walker (2 wheeled) Transfers: Sit to/from [facility] Sit to Stand: Min assist Stand pivot transfers: Min assist       General transfer comment: simulated toilete transfer with transfer to recliner.  took approx. 4 steps. required cues for rw management, completing full turn before attemtping sit down, and reaching for arm rests prior to sitting down    Balance                                            ADL either performed or assessed with clinical judgement   ADL Overall ADL's : Needs assistance/impaired     Grooming: Oral care;Sitting;Set up               Lower Body Dressing: Set up;Sitting/lateral leans Lower Body Dressing Details (indicate cue type and reason): able to don shoes in sitting (slip ons) Toilet Transfer: Minimal assistance;Ambulation;RW;Cueing for sequencing;Cueing for safety Toilet Transfer Details (indicate cue type and reason): simulated with transfer to recliner.  took approx. 4 steps. required cues for rw management, completing full turn before attemtping sit down, and reaching for arm rests prior to sitting down         Functional mobility during ADLs: Minimal assistance;Rolling walker;Cueing for safety;Cueing for sequencing General ADL Comments: lb dressing seated, grooming task seated, simulated toilet transfer in room     Vision       Perception     Praxis      Cognition Arousal/Alertness: Awake/alert Behavior During Therapy: Flat affect Overall Cognitive Status: No family/caregiver present to determine baseline cognitive functioning                                 General Comments: Pt with delayed responses.  L Inattention but can look left with cues. 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  when asked about hobbies stated cooking. when asked her favorite things to cook or favorite recipes her family likes her to make she just kept saying "i dont know".  asked about where she go her shoes. took several minutes.  she said "im thinking" then said "i dont remember the name, but it was a storeTherapist, art Instructions       General Comments      Pertinent Vitals/ Pain       Pain Assessment: No/denies pain  Home Living     Available Help at Discharge: Family;Available PRN/intermittently Type of Home: House                               Lives With: Alone    Prior Functioning/Environment              Frequency  Min 3X/week        Progress Toward Goals  OT Goals(current goals can now be found in the care plan section)  Progress towards OT goals: Progressing toward goals     Plan      Co-evaluation                 AM-PAC OT "6 Clicks" Daily Activity     Outcome Measure   Help from another person eating meals?: A Little Help from another person taking care of personal grooming?: A Little Help from another person toileting, which includes using toliet, bedpan, or urinal?: A Little Help from another person bathing (including washing, rinsing, drying)?: A Little Help from another person to put on and taking off regular upper body clothing?: A Little Help from another person to put on and taking off regular lower body clothing?: A Little 6 Click Score: 18    End of Session Equipment Utilized During Treatment: Gait belt;Rolling walker  OT Visit Diagnosis: Unsteadiness on feet (R26.81);Muscle weakness (generalized) (M62.81)   Activity Tolerance Patient tolerated treatment well   Patient Left in chair;with call bell/phone within reach;with chair alarm set   Nurse Communication          Time: [phone] OT Time Calculation (min): 18 min  Charges: OT General Charges $OT Visit: 1 Visit OT Treatments $Self Care/Home Management : 8-22 mins  [name], COTA/L Acute Rehabilitation [phone]   Clearnce Sorrel  04/12/2021, 10:53 AM

## 2021-04-13 DIAGNOSIS — I5022 Chronic systolic (congestive) heart failure: Secondary | ICD-10-CM

## 2021-04-13 LAB — CBC
HCT: 43.6 % (ref 36.0–46.0)
Hemoglobin: 14.6 g/dL (ref 12.0–15.0)
MCH: 30.4 pg (ref 26.0–34.0)
MCHC: 33.5 g/dL (ref 30.0–36.0)
MCV: 90.8 fL (ref 80.0–100.0)
Platelets: 328 10*3/uL (ref 150–400)
RBC: 4.8 MIL/uL (ref 3.87–5.11)
RDW: 13.3 % (ref 11.5–15.5)
WBC: 10.4 10*3/uL (ref 4.0–10.5)
nRBC: 0 % (ref 0.0–0.2)

## 2021-04-13 LAB — BASIC METABOLIC PANEL
Anion gap: 9 (ref 5–15)
BUN: 15 mg/dL (ref 8–23)
CO2: 26 mmol/L (ref 22–32)
Calcium: 9.9 mg/dL (ref 8.9–10.3)
Chloride: 100 mmol/L (ref 98–111)
Creatinine, Ser: 1.06 mg/dL — ABNORMAL HIGH (ref 0.44–1.00)
GFR, Estimated: 59 mL/min — ABNORMAL LOW (ref >60)
Glucose, Bld: 112 mg/dL — ABNORMAL HIGH (ref 70–99)
Potassium: 3.8 mmol/L (ref 3.5–5.1)
Sodium: 135 mmol/L (ref 135–145)

## 2021-04-13 NOTE — Progress Notes (Signed)
[facility]                                                                              Patient Demographics  [name], is a 65 y.o. female, DOB - 1956-02-07, WNU:[id number]  Admit date - 04/10/2021   Admitting Physician Kerney Elbe, MD  Outpatient Primary MD for the patient is Baxter Hire, MD  Outpatient specialists:   LOS - 3  days   Medical records reviewed and are as summarized below:    No chief complaint on file.      Brief summary   Patient is a 65 year old female with history of CAD, bilateral carotid disease, CKD, prior history of CVA in 2014, depression, GERD, hypertension, hyperlipidemia presented as initial transfer from [facility] ICU admission with complaints of left-sided weakness and feeling off balance.  Patient reported that she was in her usual state of health and night before, took Ambien 10 mg, woke up on the morning around 4:30 AM with left-sided weakness, dizziness, difficulty with balance and falling to the left whenever she attempted to ambulate throughout the day.  No other focal neurological deficits. In ED, patient was noted to have elevated BP 184/102, heart rate 70.  Patient had mild left facial weakness, left arm and leg ataxia otherwise no obvious focal neurological deficits. Noncontrast CT showed acute right thalamic hemorrhage, 5 mm right-to-left midline shift. EDP had discussed with neurosurgery who recommended no surgical intervention. EDP discussed with neurology, Dr. Cheral Marker and patient was transferred to [facility]. Patient admitted under neurology service, underwent extensive work-up PT valuation recommended CIR.  [facility] assumed care on 04/12/2021   Assessment & Plan    Principal problem   ICH (intracerebral hemorrhage) ([place]) likely secondary to hypertensive emergency, small vessel disease -Presented with resultant left arm weakness and mild left facial droop. -CT head showed small right thalamic ICH.  Repeat CT showed  stable right thalamic ICH. 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 -MRA showed multifocal moderate stenosis of left PCA -2D echo showed EF of 55 to 60%, carotid Dopplers unremarkable -LDL 177, continue statin -Hemoglobin A1c 6.0 -Passed swallow screen, now on regular diet. -PT recommended CIR, consult placed -Neurology following, no antithrombotics PTA, now on no antithrombotics due to [place].  Recommend to hold for 2 weeks and repeat CT head.  If stable and BP controlled, then start aspirin for secondary stroke prevention.   History of prior CVA -In 2014 and 2016 -Management as #1  Hypertensive emergency, history of prior CAD -Presented with BP of 184/102 with small ICH -Goal BP less than 160 -Continue triamterene HCTZ 37.5-25 mg daily, metoprolol 25 mg twice daily  -Continue labetalol as needed with parameters  GERD -Continue PPI  Anxiety -Continue paroxetine   Code Status: Full CODE STATUS DVT Prophylaxis:  SCD's Start: 04/10/21 2021   Level of Care: Level of care: Progressive Family Communication: Discussed all imaging results, lab results, explained to the patient    Disposition Plan:     Status is: Inpatient  Remains inpatient appropriate because:Inpatient level of  care appropriate due to severity of illness   Dispo: The patient is from: Home              Anticipated d/c is to: CIR              Patient currently is not medically stable to d/c.  Awaiting CIR   Difficult to place patient No      Time Spent in minutes 35 minutes Procedures:  2D echo, MRI, MRA, carotid Dopplers  Consultants:   Admitted by neurology service on 6/2  Antimicrobials:   Anti-infectives (From admission, onward)   None         Medications  Scheduled Meds: . amLODipine  5 mg Oral Daily  . FLUoxetine  40 mg Oral Daily  . metoprolol tartrate  25 mg Oral BID  .  pantoprazole  40 mg Oral QHS  . rosuvastatin  20 mg Oral Daily  . senna-docusate  1 tablet Oral BID  . triamterene-hydrochlorothiazide  1 tablet Oral Daily   Continuous Infusions: PRN Meds:.acetaminophen **OR** acetaminophen (TYLENOL) oral liquid 160 mg/5 mL **OR** acetaminophen, labetalol      Subjective:   Rose Hegner was seen and examined today.  No acute complaints.  Tolerating regular diet.  Somewhat sleepy today but easily arousable.  No acute events overnight.  Patient denies dizziness, chest pain, shortness of breath, abdominal pain, N/V/D/C.    Objective:   Vitals:   04/12/21 2126 04/12/21 2305 04/13/21 0338 04/13/21 0700  BP: (!) 144/86 (!) 164/82 (!) 152/71 130/73  Pulse: 86 62 (!) 56 (!) 56  Resp:  17 18 13   Temp:  98.8 F (37.1 C) 98.3 F (36.8 C) 98.2 F (36.8 C)  TempSrc:  Oral Oral Oral  SpO2:  91% 93% 93%    Intake/Output Summary (Last 24 hours) at 04/13/2021 1125 Last data filed at 04/12/2021 1930 Gross per 24 hour  Intake 240 ml  Output 250 ml  Net -10 ml     Wt Readings from Last 3 Encounters:  04/10/21 66.3 kg  01/27/21 65.3 kg  11/12/20 65.3 kg    Physical Exam  General: Somewhat lethargic but easily arousable and oriented, mild left facial droop  Cardiovascular: S1 S2 clear, RRR. No pedal edema b/l  Respiratory: CTAB, no wheezing, rales or rhonchi  Gastrointestinal: Soft, nontender, nondistended, NBS  Ext: no pedal edema bilaterally  Neuro: no acute new deficits  Skin: No rashes  Psych: Normal affect and demeanor, alert and oriented x3     Data Reviewed:  I have personally reviewed following labs and imaging studies  Micro Results Recent Results (from the past 240 hour(s))  Resp Panel by RT-PCR (Flu A&B, Covid) Nasopharyngeal Swab     Status: None   Collection Time: 04/09/21 11:54 PM   Specimen: Nasopharyngeal Swab; Nasopharyngeal(NP) swabs in vial transport medium  Result Value Ref Range Status   SARS Coronavirus 2 by RT  PCR NEGATIVE NEGATIVE Final    Comment: (NOTE) SARS-CoV-2 target nucleic acids are NOT DETECTED.  The SARS-CoV-2 RNA is generally detectable in upper respiratory specimens during the acute phase of infection. The lowest concentration of SARS-CoV-2 viral copies this assay can detect is 138 copies/mL. A negative result does not preclude SARS-Cov-2 infection and should not be used as the sole basis for treatment or other patient management decisions. A negative result may occur with  improper specimen collection/handling, submission of specimen other than nasopharyngeal swab, presence of viral mutation(s) within the areas targeted by this assay,  and inadequate number of viral copies(<138 copies/mL). A negative result must be combined with clinical observations, patient history, and epidemiological information. The expected result is Negative.  Fact Sheet for Patients:  EntrepreneurPulse.com.au  Fact Sheet for Healthcare Providers:  IncredibleEmployment.be  This test is no t yet approved or cleared by the [place] FDA and  has been authorized for detection and/or diagnosis of SARS-CoV-2 by FDA under an Emergency Use Authorization (EUA). This EUA will remain  in effect (meaning this test can be used) for the duration of the COVID-19 declaration under Section 564(b)(1) of the Act, 21 U.S.C.section 360bbb-3(b)(1), unless the authorization is terminated  or revoked sooner.       Influenza A by PCR NEGATIVE NEGATIVE Final   Influenza B by PCR NEGATIVE NEGATIVE Final    Comment: (NOTE) The Xpert Xpress SARS-CoV-2/FLU/RSV plus assay is intended as an aid in the diagnosis of influenza from Nasopharyngeal swab specimens and should not be used as a sole basis for treatment. Nasal washings and aspirates are unacceptable for Xpert Xpress SARS-CoV-2/FLU/RSV testing.  Fact Sheet for Patients: EntrepreneurPulse.com.au  Fact Sheet for  Healthcare Providers: IncredibleEmployment.be  This test is not yet approved or cleared by the [place] FDA and has been authorized for detection and/or diagnosis of SARS-CoV-2 by FDA under an Emergency Use Authorization (EUA). This EUA will remain in effect (meaning this test can be used) for the duration of the COVID-19 declaration under Section 564(b)(1) of the Act, 21 U.S.C. section 360bbb-3(b)(1), unless the authorization is terminated or revoked.  Performed at [facility], [address] [place], [place] [address]   MRSA PCR Screening     Status: None   Collection Time: 04/10/21  3:27 AM   Specimen: Nasopharyngeal  Result Value Ref Range Status   MRSA by PCR NEGATIVE NEGATIVE Final    Comment:        The GeneXpert MRSA Assay (FDA approved for NASAL specimens only), is one component of a comprehensive MRSA colonization surveillance program. It is not intended to diagnose MRSA infection nor to guide or monitor treatment for MRSA infections. Performed at [facility], [address] [place], [place] [address]   MRSA PCR Screening     Status: None   Collection Time: 04/10/21 10:36 PM   Specimen: Nasopharyngeal  Result Value Ref Range Status   MRSA by PCR NEGATIVE NEGATIVE Final    Comment:        The GeneXpert MRSA Assay (FDA approved for NASAL specimens only), is one component of a comprehensive MRSA colonization surveillance program. It is not intended to diagnose MRSA infection nor to guide or monitor treatment for MRSA infections. Performed at [facility], [place] [address] [place], [place] [address]     Radiology Reports CT HEAD WO CONTRAST  Result Date: 04/10/2021 CLINICAL DATA:  Intracranial hemorrhage follow up EXAM: CT HEAD WITHOUT CONTRAST TECHNIQUE: Contiguous axial images were obtained from the base of the skull through the vertex without intravenous contrast. COMPARISON:  04/10/2021 at 2:50 a.m.  FINDINGS: Brain: Unchanged size of intraparenchymal hematoma centered in the right thalamus. No new site of hemorrhage. There is periventricular hypoattenuation compatible with chronic microvascular disease. Mild generalized volume loss. Unchanged size and configuration of the ventricles. Chronic small vessel infarct of the left basal ganglia. Vascular: No hyperdense vessel or unexpected calcification. Skull: Normal. Negative for fracture or focal lesion. Sinuses/Orbits: No acute finding. Other: None IMPRESSION: Unchanged size of intraparenchymal hematoma centered in the right thalamus.  Electronically Signed   By: Ulyses Jarred M.D.   On: 04/10/2021 23:15   CT HEAD WO CONTRAST  Result Date: 04/10/2021 CLINICAL DATA:  Intracranial hemorrhage follow-up EXAM: CT HEAD WITHOUT CONTRAST TECHNIQUE: Contiguous axial images were obtained from the base of the skull through the vertex without intravenous contrast. COMPARISON:  04/09/2021 FINDINGS: Brain: Unchanged size of intraparenchymal hemorrhage in the right thalamus. Trace leftward midline shift is unchanged. Old left caudate infarct. There is periventricular hypoattenuation compatible with chronic microvascular disease. Vascular: No abnormal hyperdensity of the major intracranial arteries or dural venous sinuses. No intracranial atherosclerosis. Skull: The visualized skull base, calvarium and extracranial soft tissues are normal. Sinuses/Orbits: No fluid levels or advanced mucosal thickening of the visualized paranasal sinuses. No mastoid or middle ear effusion. The orbits are normal. IMPRESSION: Unchanged size of right thalamic intraparenchymal hemorrhage with trace leftward midline shift. Electronically Signed   By: Ulyses Jarred M.D.   On: 04/10/2021 03:12   CT HEAD WO CONTRAST  Result Date: 04/09/2021 CLINICAL DATA:  TIA. EXAM: CT HEAD WITHOUT CONTRAST TECHNIQUE: Contiguous axial images were obtained from the base of the skull through the vertex without  intravenous contrast. COMPARISON:  05/18/2020 FINDINGS: Brain: There is hemorrhage within the right thalamus measuring 1.8 x 1.2 x 1.3 cm. Surrounding vasogenic edema. 5 mm of associated right to left midline shift. No hydrocephalus. Old left basal ganglia lacunar infarct. Chronic small vessel disease throughout the deep white matter. Vascular: No hyperdense vessel or unexpected calcification. Skull: No acute calvarial abnormality. Sinuses/Orbits: Visualized paranasal sinuses and mastoids clear. Orbital soft tissues unremarkable. Other: None IMPRESSION: Acute right thalamic hemorrhage. 5 mm of right to left midline shift. Old left basal ganglia lacunar infarct. Critical Value/emergent results were called by telephone at the time of interpretation on 04/09/2021 at 10:38 pm to provider [facility] , who verbally acknowledged these results. Electronically Signed   By: Rolm Baptise M.D.   On: 04/09/2021 22:40   MR MRA HEAD WO CONTRAST  Result Date: 04/10/2021 CLINICAL DATA:  Intraparenchymal hemorrhage of the right thalamus EXAM: MRA HEAD WITHOUT CONTRAST TECHNIQUE: Angiographic images of the Circle of Willis were acquired using MRA technique without intravenous contrast. COMPARISON:  No pertinent prior exam. FINDINGS: POSTERIOR CIRCULATION: --Vertebral arteries: Normal --Inferior cerebellar arteries: Poor visualization right PICA. Normal left PICA. --Basilar artery: Normal. --Superior cerebellar arteries: Normal. --Posterior cerebral arteries: Multifocal moderate stenosis of the left PCA. ANTERIOR CIRCULATION: --Intracranial internal carotid arteries: Normal. --Anterior cerebral arteries (ACA): Normal. --Middle cerebral arteries (MCA): Normal. ANATOMIC VARIANTS: None IMPRESSION: 1. No proximal large vessel occlusion or high-grade stenosis. 2. Multifocal moderate stenosis of the left PCA. Electronically Signed   By: Ulyses Jarred M.D.   On: 04/10/2021 23:36   MR BRAIN WO CONTRAST  Result Date: 04/10/2021 CLINICAL  DATA:  Intracranial hemorrhage EXAM: MRI HEAD WITHOUT CONTRAST TECHNIQUE: Multiplanar, multiecho pulse sequences of the brain and surrounding structures were obtained without intravenous contrast. COMPARISON:  Correlation made with recent CT imaging.  MRI 2016 FINDINGS: Brain: Acute hemorrhage within the right thalamus is again identified with surrounding edema effacing the adjacent third ventricle. There is no intraventricular extension. No hydrocephalus. Chronic infarct of the left basal ganglia and adjacent white matter. Additional patchy and confluent areas of T2 hyperintensity in the supratentorial greater than pontine white matter are nonspecific but probably reflect moderate chronic microvascular ischemic changes. There is a focus of susceptibility in the left frontal subcortical white matter; adjacent linear susceptibility could reflect a developmental venous anomaly  and therefore this may represent a cavernous malformation. There is no intracranial mass, noting that there is limited evaluation for underlying right thalamic lesion without contrast. There is no hydrocephalus or extra-axial fluid collection. Vascular: Major vessel flow voids at the skull base are preserved. Skull and upper cervical spine: Normal marrow signal is preserved. Sinuses/Orbits: Paranasal sinuses are aerated. Orbits are unremarkable. Other: Sella is unremarkable.  Mastoid air cells are clear. IMPRESSION: Acute right thalamic hemorrhage with mild mass effect. No intraventricular extension. Moderate chronic microvascular ischemic changes. 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 Electronically Signed   By: Macy Mis M.D.   On: 04/10/2021 14:02   US Carotid Bilateral (at [facility] and [facility] only)  Result Date: 04/10/2021 CLINICAL DATA:  Hyperlipidemia, stroke symptoms EXAM: BILATERAL CAROTID DUPLEX ULTRASOUND TECHNIQUE: Pearline Cables scale imaging, color Doppler and  duplex ultrasound were performed of bilateral carotid and vertebral arteries in the neck. COMPARISON:  None. FINDINGS: Criteria: Quantification of carotid stenosis is based on velocity parameters that correlate the residual internal carotid diameter with NASCET-based stenosis levels, using the diameter of the distal internal carotid lumen as the denominator for stenosis measurement. The following velocity measurements were obtained: RIGHT ICA: 68/17 cm/sec CCA: 29/93 cm/sec SYSTOLIC ICA/CCA RATIO:  0.8 ECA: 90 cm/sec LEFT ICA: 64/10 cm/sec CCA: 71/69 cm/sec SYSTOLIC ICA/CCA RATIO:  0.8 ECA: 113 cm/sec RIGHT CAROTID ARTERY: Minor echogenic shadowing plaque formation. No hemodynamically significant right ICA stenosis, velocity elevation, or turbulent flow. Degree of narrowing less than 50%. RIGHT VERTEBRAL ARTERY:  Normal antegrade flow LEFT CAROTID ARTERY: Similar scattered minor echogenic plaque formation. No hemodynamically significant left ICA stenosis, velocity elevation, or turbulent flow. LEFT VERTEBRAL ARTERY:  Normal antegrade flow IMPRESSION: Bilateral carotid atherosclerosis. No hemodynamically significant ICA stenosis. Degree of narrowing less than 50% bilaterally by ultrasound criteria. Patent antegrade vertebral flow bilaterally Electronically Signed   By: Jerilynn Mages.  Shick M.D.   On: 04/10/2021 14:46   ECHOCARDIOGRAM COMPLETE  Result Date: 04/10/2021    ECHOCARDIOGRAM REPORT   Patient Name:   CHRYSTA FULCHER Date of Exam: 04/10/2021 Medical Rec #:  [id number]     Height:       62.0 in Accession #:    [id number]    Weight:       146.2 lb Date of Birth:  December 17, 1955     BSA:          1.673 m Patient Age:    65 years      BP:           114/60 mmHg Patient Gender: F             HR:           73 bpm. Exam Location:  [facility] Procedure: 2D Echo, Color Doppler and Cardiac Doppler Indications:     I63.9 Stroke  History:         Patient has prior history of Echocardiogram examinations.                  Stroke; Risk  Factors:Current Smoker, Hypertension, Dyslipidemia                  and HCL.  Sonographer:     Charmayne Sheer RDCS (AE) Referring Phys:  [place] Diagnosing Phys: Kathlyn Sacramento MD  Sonographer Comments: Suboptimal apical window. Global longitudinal strain was attempted. IMPRESSIONS  1. Left ventricular ejection fraction, by estimation, is 55 to 60%. The  left ventricle has normal function. The left ventricle has no regional wall motion abnormalities. Left ventricular diastolic parameters are consistent with Grade I diastolic dysfunction (impaired relaxation). The global longitudinal strain is normal.  2. Right ventricular systolic function is normal. The right ventricular size is normal. Tricuspid regurgitation signal is inadequate for assessing PA pressure.  3. Trivial pericardial effusion is present. The pericardial effusion is circumferential.  4. The mitral valve is normal in structure. No evidence of mitral valve regurgitation. No evidence of mitral stenosis.  5. The aortic valve is normal in structure. Aortic valve regurgitation is mild. Mild aortic valve sclerosis is present, with no evidence of aortic valve stenosis.  6. The inferior vena cava is normal in size with greater than 50% respiratory variability, suggesting right atrial pressure of 3 mmHg. FINDINGS  Left Ventricle: Left ventricular ejection fraction, by estimation, is 55 to 60%. The left ventricle has normal function. The left ventricle has no regional wall motion abnormalities. The global longitudinal strain is normal. The left ventricular internal cavity size was normal in size. There is no left ventricular hypertrophy. Left ventricular diastolic parameters are consistent with Grade I diastolic dysfunction (impaired relaxation). Right Ventricle: The right ventricular size is normal. No increase in right ventricular wall thickness. Right ventricular systolic function is normal. Tricuspid regurgitation signal is inadequate for  assessing PA pressure. Left Atrium: Left atrial size was normal in size. Right Atrium: Right atrial size was normal in size. Pericardium: Trivial pericardial effusion is present. The pericardial effusion is circumferential. Mitral Valve: The mitral valve is normal in structure. No evidence of mitral valve regurgitation. No evidence of mitral valve stenosis. MV peak gradient, 3.4 mmHg. The mean mitral valve gradient is 1.0 mmHg. Tricuspid Valve: The tricuspid valve is normal in structure. Tricuspid valve regurgitation is not demonstrated. No evidence of tricuspid stenosis. Aortic Valve: The aortic valve is normal in structure. Aortic valve regurgitation is mild. Mild aortic valve sclerosis is present, with no evidence of aortic valve stenosis. Aortic valve mean gradient measures 5.0 mmHg. Aortic valve peak gradient measures 11.3 mmHg. Aortic valve area, by VTI measures 1.61 cm. Pulmonic Valve: The pulmonic valve was normal in structure. Pulmonic valve regurgitation is mild. No evidence of pulmonic stenosis. Aorta: The aortic root is normal in size and structure. Venous: The inferior vena cava is normal in size with greater than 50% respiratory variability, suggesting right atrial pressure of 3 mmHg. IAS/Shunts: No atrial level shunt detected by color flow Doppler.  LEFT VENTRICLE PLAX 2D LVIDd:         4.60 cm  Diastology LVIDs:         3.10 cm  LV e' medial:    6.42 cm/s LV PW:         1.20 cm  LV E/e' medial:  9.2 LV IVS:        0.90 cm  LV e' lateral:   6.20 cm/s LVOT diam:     1.90 cm  LV E/e' lateral: 9.5 LV SV:         47 LV SV Index:   28 LVOT Area:     2.84 cm  RIGHT VENTRICLE RV Basal diam:  2.50 cm LEFT ATRIUM           Index       RIGHT ATRIUM           Index LA diam:      3.40 cm 2.03 cm/m  RA Area:     10.30 cm  LA Vol (A2C): 40.5 ml 24.21 ml/m RA Volume:   17.80 ml  10.64 ml/m LA Vol (A4C): 41.2 ml 24.62 ml/m  AORTIC VALVE                    PULMONIC VALVE AV Area (Vmax):    1.74 cm     PV Vmax:        1.26 m/s AV Area (Vmean):   1.75 cm     PV Vmean:      84.600 cm/s AV Area (VTI):     1.61 cm     PV VTI:        0.275 m AV Vmax:           168.00 cm/s  PV Peak grad:  6.4 mmHg AV Vmean:          101.000 cm/s PV Mean grad:  3.0 mmHg AV VTI:            0.292 m AV Peak Grad:      11.3 mmHg AV Mean Grad:      5.0 mmHg LVOT Vmax:         103.00 cm/s LVOT Vmean:        62.300 cm/s LVOT VTI:          0.166 m LVOT/AV VTI ratio: 0.57  AORTA Ao Root diam: 3.30 cm MITRAL VALVE MV Area (PHT): 3.60 cm    SHUNTS MV Area VTI:   1.89 cm    Systemic VTI:  0.17 m MV Peak grad:  3.4 mmHg    Systemic Diam: 1.90 cm MV Mean grad:  1.0 mmHg MV Vmax:       0.92 m/s MV Vmean:      52.7 cm/s MV Decel Time: 211 msec MV E velocity: 59.10 cm/s MV A velocity: 90.80 cm/s MV E/A ratio:  0.65 Kathlyn Sacramento MD Electronically signed by Kathlyn Sacramento MD Signature Date/Time: 04/10/2021/11:30:41 AM    Final     Lab Data:  CBC: Recent Labs  Lab 04/09/21 2218 04/10/21 0414 04/10/21 2109 04/13/21 0310  WBC 11.2* 13.6* 10.4 10.4  NEUTROABS 7.7  --   --   --   HGB 13.1 13.1 12.1 14.6  HCT 38.5 38.0 36.3 43.6  MCV 90.2 89.2 91.0 90.8  PLT 273 288 277 034   Basic Metabolic Panel: Recent Labs  Lab 04/09/21 2218 04/10/21 0414 04/10/21 2109 04/13/21 0310  NA 140 141 137 135  K 3.7 3.5 3.4* 3.8  CL 108 109 105 100  CO2 22 22 24 26   GLUCOSE 114* 127* 114* 112*  BUN 18 23 15 15   CREATININE 0.84 0.94 0.90 1.06*  CALCIUM 9.3 9.1 9.0 9.9  MG  --  1.9  --   --    GFR: Estimated Creatinine Clearance: 48.5 mL/min (A) (by C-G formula based on SCr of 1.06 mg/dL (H)). Liver Function Tests: Recent Labs  Lab 04/09/21 2218 04/10/21 2109  AST 24 26  ALT 18 19  ALKPHOS 91 82  BILITOT 0.7 1.2  PROT 7.7 6.5  ALBUMIN 3.9 3.4*   No results for input(s): LIPASE, AMYLASE in the last 168 hours. No results for input(s): AMMONIA in the last 168 hours. Coagulation Profile: Recent Labs  Lab 04/09/21 2218  INR 1.0   Cardiac  Enzymes: No results for input(s): CKTOTAL, CKMB, CKMBINDEX, TROPONINI in the last 168 hours. BNP (last 3 results) No results for input(s): PROBNP in the last 8760 hours. HbA1C: No results for  input(s): HGBA1C in the last 72 hours. CBG: Recent Labs  Lab 04/09/21 2251 04/10/21 0308 04/10/21 1520  GLUCAP 117* 142* 101*   Lipid Profile: No results for input(s): CHOL, HDL, LDLCALC, TRIG, CHOLHDL, LDLDIRECT in the last 72 hours. Thyroid Function Tests: No results for input(s): TSH, T4TOTAL, FREET4, T3FREE, THYROIDAB in the last 72 hours. Anemia Panel: No results for input(s): VITAMINB12, FOLATE, FERRITIN, TIBC, IRON, RETICCTPCT in the last 72 hours. Urine analysis:    Component Value Date/Time   COLORURINE YELLOW (A) 11/04/2020 1020   APPEARANCEUR HAZY (A) 11/04/2020 1020   LABSPEC 1.025 11/04/2020 1020   PHURINE 5.0 11/04/2020 1020   GLUCOSEU NEGATIVE 11/04/2020 1020   HGBUR NEGATIVE 11/04/2020 1020   BILIRUBINUR NEGATIVE 11/04/2020 1020   KETONESUR NEGATIVE 11/04/2020 1020   PROTEINUR NEGATIVE 11/04/2020 1020   UROBILINOGEN 0.2 02/18/2015 0410   NITRITE NEGATIVE 11/04/2020 1020   LEUKOCYTESUR NEGATIVE 11/04/2020 1020     Clarrisa Kaylor M.D. [facility] 04/13/2021, 11:25 AM  Available via Epic secure chat 7am-7pm After 7 pm, please refer to night coverage provider listed on amion.

## 2021-04-13 NOTE — Progress Notes (Addendum)
Inpatient Rehab Admissions:  Inpatient Rehab Consult received.  I met with patient at the bedside for rehabilitation assessment and to discuss goals and expectations of an inpatient rehab admission.  She acknowledged understanding of goals and expectations. She is interested in pursuing CIR.  Pt gave permission to contact sister, Karna Christmas.  Attempting to get in touch with sister. Will continue to follow.  Addendum:  Able to speak with Terri on the telephone. She informed AC that pt lives alone, all family members work and are only able to provide intermittent supervision. Discussed following pt's progress with therapy to help determine appropriate discharge setting.  Signed: Gayland Curry, [place], [place] Admissions Coordinator [phone]

## 2021-04-13 NOTE — Progress Notes (Signed)
STROKE TEAM PROGRESS NOTE   SUBJECTIVE (INTERVAL HISTORY)  Overall her condition is stable. Pt awake alert orientated to place time and age. Still has left facial droop and left arm weakness. Cleared for regular solids and thin liquid yesterday. BP at goal,    OBJECTIVE Temp:  [97.6 F (36.4 C)-98.8 F (37.1 C)] 98.2 F (36.8 C) (06/05 0700) Pulse Rate:  [56-86] 56 (06/05 0700) Cardiac Rhythm: Sinus bradycardia (06/05 0700) Resp:  [13-18] 13 (06/05 0700) BP: (127-164)/(71-90) 130/73 (06/05 0700) SpO2:  [91 %-93 %] 93 % (06/05 0700)  Recent Labs  Lab 04/09/21 2251 04/10/21 0308 04/10/21 1520  GLUCAP 117* 142* 101*   Recent Labs  Lab 04/09/21 2218 04/10/21 0414 04/10/21 2109 04/13/21 0310  NA 140 141 137 135  K 3.7 3.5 3.4* 3.8  CL 108 109 105 100  CO2 22 22 24 26   GLUCOSE 114* 127* 114* 112*  BUN 18 23 15 15   CREATININE 0.84 0.94 0.90 1.06*  CALCIUM 9.3 9.1 9.0 9.9  MG  --  1.9  --   --    Recent Labs  Lab 04/09/21 2218 04/10/21 2109  AST 24 26  ALT 18 19  ALKPHOS 91 82  BILITOT 0.7 1.2  PROT 7.7 6.5  ALBUMIN 3.9 3.4*   Recent Labs  Lab 04/09/21 2218 04/10/21 0414 04/10/21 2109 04/13/21 0310  WBC 11.2* 13.6* 10.4 10.4  NEUTROABS 7.7  --   --   --   HGB 13.1 13.1 12.1 14.6  HCT 38.5 38.0 36.3 43.6  MCV 90.2 89.2 91.0 90.8  PLT 273 288 277 328   No results for input(s): CKTOTAL, CKMB, CKMBINDEX, TROPONINI in the last 168 hours. No results for input(s): LABPROT, INR in the last 72 hours. No results for input(s): COLORURINE, LABSPEC, [place], GLUCOSEU, HGBUR, BILIRUBINUR, KETONESUR, PROTEINUR, UROBILINOGEN, NITRITE, LEUKOCYTESUR in the last 72 hours.  Invalid input(s): APPERANCEUR     Component Value Date/Time   CHOL 283 (H) 04/10/2021 0414   CHOL 192 05/15/2014 0500   TRIG 320 (H) 04/10/2021 0414   TRIG 155 05/15/2014 0500   HDL 42 04/10/2021 0414   HDL 51 05/15/2014 0500   CHOLHDL 6.7 04/10/2021 0414   VLDL 64 (H) 04/10/2021 0414   VLDL 31  05/15/2014 0500   LDLCALC 177 (H) 04/10/2021 0414   LDLCALC 110 (H) 05/15/2014 0500   Lab Results  Component Value Date   HGBA1C 6.0 (H) 04/10/2021      Component Value Date/Time   LABOPIA NONE DETECTED 02/18/2015 0410   COCAINSCRNUR NONE DETECTED 02/18/2015 0410   LABBENZ NONE DETECTED 02/18/2015 0410   AMPHETMU NONE DETECTED 02/18/2015 0410   THCU NONE DETECTED 02/18/2015 0410   LABBARB NONE DETECTED 02/18/2015 0410    No results for input(s): ETH in the last 168 hours.  I have personally reviewed the radiological images below and agree with the radiology interpretations.  CT HEAD WO CONTRAST  Result Date: 04/10/2021 CLINICAL DATA:  Intracranial hemorrhage follow up EXAM: CT HEAD WITHOUT CONTRAST TECHNIQUE: Contiguous axial images were obtained from the base of the skull through the vertex without intravenous contrast. COMPARISON:  04/10/2021 at 2:50 a.m. FINDINGS: Brain: Unchanged size of intraparenchymal hematoma centered in the right thalamus. No new site of hemorrhage. There is periventricular hypoattenuation compatible with chronic microvascular disease. Mild generalized volume loss. Unchanged size and configuration of the ventricles. Chronic small vessel infarct of the left basal ganglia. Vascular: No hyperdense vessel or unexpected calcification. Skull: Normal. Negative for fracture  or focal lesion. Sinuses/Orbits: No acute finding. Other: None IMPRESSION: Unchanged size of intraparenchymal hematoma centered in the right thalamus. Electronically Signed   By: Ulyses Jarred M.D.   On: 04/10/2021 23:15   CT HEAD WO CONTRAST  Result Date: 04/10/2021 CLINICAL DATA:  Intracranial hemorrhage follow-up EXAM: CT HEAD WITHOUT CONTRAST TECHNIQUE: Contiguous axial images were obtained from the base of the skull through the vertex without intravenous contrast. COMPARISON:  04/09/2021 FINDINGS: Brain: Unchanged size of intraparenchymal hemorrhage in the right thalamus. Trace leftward midline  shift is unchanged. Old left caudate infarct. There is periventricular hypoattenuation compatible with chronic microvascular disease. Vascular: No abnormal hyperdensity of the major intracranial arteries or dural venous sinuses. No intracranial atherosclerosis. Skull: The visualized skull base, calvarium and extracranial soft tissues are normal. Sinuses/Orbits: No fluid levels or advanced mucosal thickening of the visualized paranasal sinuses. No mastoid or middle ear effusion. The orbits are normal. IMPRESSION: Unchanged size of right thalamic intraparenchymal hemorrhage with trace leftward midline shift. Electronically Signed   By: Ulyses Jarred M.D.   On: 04/10/2021 03:12   CT HEAD WO CONTRAST  Result Date: 04/09/2021 CLINICAL DATA:  TIA. EXAM: CT HEAD WITHOUT CONTRAST TECHNIQUE: Contiguous axial images were obtained from the base of the skull through the vertex without intravenous contrast. COMPARISON:  05/18/2020 FINDINGS: Brain: There is hemorrhage within the right thalamus measuring 1.8 x 1.2 x 1.3 cm. Surrounding vasogenic edema. 5 mm of associated right to left midline shift. No hydrocephalus. Old left basal ganglia lacunar infarct. Chronic small vessel disease throughout the deep white matter. Vascular: No hyperdense vessel or unexpected calcification. Skull: No acute calvarial abnormality. Sinuses/Orbits: Visualized paranasal sinuses and mastoids clear. Orbital soft tissues unremarkable. Other: None IMPRESSION: Acute right thalamic hemorrhage. 5 mm of right to left midline shift. Old left basal ganglia lacunar infarct. Critical Value/emergent results were called by telephone at the time of interpretation on 04/09/2021 at 10:38 pm to provider [facility] , who verbally acknowledged these results. Electronically Signed   By: Rolm Baptise M.D.   On: 04/09/2021 22:40   MR MRA HEAD WO CONTRAST  Result Date: 04/10/2021 CLINICAL DATA:  Intraparenchymal hemorrhage of the right thalamus EXAM: MRA HEAD WITHOUT  CONTRAST TECHNIQUE: Angiographic images of the Circle of Willis were acquired using MRA technique without intravenous contrast. COMPARISON:  No pertinent prior exam. FINDINGS: POSTERIOR CIRCULATION: --Vertebral arteries: Normal --Inferior cerebellar arteries: Poor visualization right PICA. Normal left PICA. --Basilar artery: Normal. --Superior cerebellar arteries: Normal. --Posterior cerebral arteries: Multifocal moderate stenosis of the left PCA. ANTERIOR CIRCULATION: --Intracranial internal carotid arteries: Normal. --Anterior cerebral arteries (ACA): Normal. --Middle cerebral arteries (MCA): Normal. ANATOMIC VARIANTS: None IMPRESSION: 1. No proximal large vessel occlusion or high-grade stenosis. 2. Multifocal moderate stenosis of the left PCA. Electronically Signed   By: Ulyses Jarred M.D.   On: 04/10/2021 23:36   MR BRAIN WO CONTRAST  Result Date: 04/10/2021 CLINICAL DATA:  Intracranial hemorrhage EXAM: MRI HEAD WITHOUT CONTRAST TECHNIQUE: Multiplanar, multiecho pulse sequences of the brain and surrounding structures were obtained without intravenous contrast. COMPARISON:  Correlation made with recent CT imaging.  MRI 2016 FINDINGS: Brain: Acute hemorrhage within the right thalamus is again identified with surrounding edema effacing the adjacent third ventricle. There is no intraventricular extension. No hydrocephalus. Chronic infarct of the left basal ganglia and adjacent white matter. Additional patchy and confluent areas of T2 hyperintensity in the supratentorial greater than pontine white matter are nonspecific but probably reflect moderate chronic microvascular ischemic changes. There is  a focus of susceptibility in the left frontal subcortical white matter; adjacent linear susceptibility could reflect a developmental venous anomaly and therefore this may represent a cavernous malformation. There is no intracranial mass, noting that there is limited evaluation for underlying right thalamic lesion without  contrast. There is no hydrocephalus or extra-axial fluid collection. Vascular: Major vessel flow voids at the skull base are preserved. Skull and upper cervical spine: Normal marrow signal is preserved. Sinuses/Orbits: Paranasal sinuses are aerated. Orbits are unremarkable. Other: Sella is unremarkable.  Mastoid air cells are clear. IMPRESSION: Acute right thalamic hemorrhage with mild mass effect. No intraventricular extension. Moderate chronic microvascular ischemic changes. 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 Electronically Signed   By: Macy Mis M.D.   On: 04/10/2021 14:02   US Carotid Bilateral (at [facility] and [facility] only)  Result Date: 04/10/2021 CLINICAL DATA:  Hyperlipidemia, stroke symptoms EXAM: BILATERAL CAROTID DUPLEX ULTRASOUND TECHNIQUE: Pearline Cables scale imaging, color Doppler and duplex ultrasound were performed of bilateral carotid and vertebral arteries in the neck. COMPARISON:  None. FINDINGS: Criteria: Quantification of carotid stenosis is based on velocity parameters that correlate the residual internal carotid diameter with NASCET-based stenosis levels, using the diameter of the distal internal carotid lumen as the denominator for stenosis measurement. The following velocity measurements were obtained: RIGHT ICA: 68/17 cm/sec CCA: 65/78 cm/sec SYSTOLIC ICA/CCA RATIO:  0.8 ECA: 90 cm/sec LEFT ICA: 64/10 cm/sec CCA: 46/96 cm/sec SYSTOLIC ICA/CCA RATIO:  0.8 ECA: 113 cm/sec RIGHT CAROTID ARTERY: Minor echogenic shadowing plaque formation. No hemodynamically significant right ICA stenosis, velocity elevation, or turbulent flow. Degree of narrowing less than 50%. RIGHT VERTEBRAL ARTERY:  Normal antegrade flow LEFT CAROTID ARTERY: Similar scattered minor echogenic plaque formation. No hemodynamically significant left ICA stenosis, velocity elevation, or turbulent flow. LEFT VERTEBRAL ARTERY:  Normal antegrade flow  IMPRESSION: Bilateral carotid atherosclerosis. No hemodynamically significant ICA stenosis. Degree of narrowing less than 50% bilaterally by ultrasound criteria. Patent antegrade vertebral flow bilaterally Electronically Signed   By: Jerilynn Mages.  Shick M.D.   On: 04/10/2021 14:46   ECHOCARDIOGRAM COMPLETE  Result Date: 04/10/2021    ECHOCARDIOGRAM REPORT   Patient Name:   [name] Date of Exam: 04/10/2021 Medical Rec #:  [id number]     Height:       62.0 in Accession #:    [id number]    Weight:       146.2 lb Date of Birth:  [birth year]     BSA:          1.673 m Patient Age:    81 years      BP:           114/60 mmHg Patient Gender: F             HR:           73 bpm. Exam Location:  [facility] Procedure: 2D Echo, Color Doppler and Cardiac Doppler Indications:     I63.9 Stroke  History:         Patient has prior history of Echocardiogram examinations.                  Stroke; Risk Factors:Current Smoker, Hypertension, Dyslipidemia                  and HCL.  Sonographer:     Charmayne Sheer RDCS (AE) Referring Phys:  [id number] Bing Neighbors OUMA Diagnosing Phys: Kathlyn Sacramento MD  Sonographer Comments: Suboptimal apical  window. Global longitudinal strain was attempted. IMPRESSIONS  1. Left ventricular ejection fraction, by estimation, is 55 to 60%. The left ventricle has normal function. The left ventricle has no regional wall motion abnormalities. Left ventricular diastolic parameters are consistent with Grade I diastolic dysfunction (impaired relaxation). The global longitudinal strain is normal.  2. Right ventricular systolic function is normal. The right ventricular size is normal. Tricuspid regurgitation signal is inadequate for assessing PA pressure.  3. Trivial pericardial effusion is present. The pericardial effusion is circumferential.  4. The mitral valve is normal in structure. No evidence of mitral valve regurgitation. No evidence of mitral stenosis.  5. The aortic valve is normal in structure. Aortic valve  regurgitation is mild. Mild aortic valve sclerosis is present, with no evidence of aortic valve stenosis.  6. The inferior vena cava is normal in size with greater than 50% respiratory variability, suggesting right atrial pressure of 3 mmHg. FINDINGS  Left Ventricle: Left ventricular ejection fraction, by estimation, is 55 to 60%. The left ventricle has normal function. The left ventricle has no regional wall motion abnormalities. The global longitudinal strain is normal. The left ventricular internal cavity size was normal in size. There is no left ventricular hypertrophy. Left ventricular diastolic parameters are consistent with Grade I diastolic dysfunction (impaired relaxation). Right Ventricle: The right ventricular size is normal. No increase in right ventricular wall thickness. Right ventricular systolic function is normal. Tricuspid regurgitation signal is inadequate for assessing PA pressure. Left Atrium: Left atrial size was normal in size. Right Atrium: Right atrial size was normal in size. Pericardium: Trivial pericardial effusion is present. The pericardial effusion is circumferential. Mitral Valve: The mitral valve is normal in structure. No evidence of mitral valve regurgitation. No evidence of mitral valve stenosis. MV peak gradient, 3.4 mmHg. The mean mitral valve gradient is 1.0 mmHg. Tricuspid Valve: The tricuspid valve is normal in structure. Tricuspid valve regurgitation is not demonstrated. No evidence of tricuspid stenosis. Aortic Valve: The aortic valve is normal in structure. Aortic valve regurgitation is mild. Mild aortic valve sclerosis is present, with no evidence of aortic valve stenosis. Aortic valve mean gradient measures 5.0 mmHg. Aortic valve peak gradient measures 11.3 mmHg. Aortic valve area, by VTI measures 1.61 cm. Pulmonic Valve: The pulmonic valve was normal in structure. Pulmonic valve regurgitation is mild. No evidence of pulmonic stenosis. Aorta: The aortic root is normal  in size and structure. Venous: The inferior vena cava is normal in size with greater than 50% respiratory variability, suggesting right atrial pressure of 3 mmHg. IAS/Shunts: No atrial level shunt detected by color flow Doppler.  LEFT VENTRICLE PLAX 2D LVIDd:         4.60 cm  Diastology LVIDs:         3.10 cm  LV e' medial:    6.42 cm/s LV PW:         1.20 cm  LV E/e' medial:  9.2 LV IVS:        0.90 cm  LV e' lateral:   6.20 cm/s LVOT diam:     1.90 cm  LV E/e' lateral: 9.5 LV SV:         47 LV SV Index:   28 LVOT Area:     2.84 cm  RIGHT VENTRICLE RV Basal diam:  2.50 cm LEFT ATRIUM           Index       RIGHT ATRIUM  Index LA diam:      3.40 cm 2.03 cm/m  RA Area:     10.30 cm LA Vol (A2C): 40.5 ml 24.21 ml/m RA Volume:   17.80 ml  10.64 ml/m LA Vol (A4C): 41.2 ml 24.62 ml/m  AORTIC VALVE                    PULMONIC VALVE AV Area (Vmax):    1.74 cm     PV Vmax:       1.26 m/s AV Area (Vmean):   1.75 cm     PV Vmean:      84.600 cm/s AV Area (VTI):     1.61 cm     PV VTI:        0.275 m AV Vmax:           168.00 cm/s  PV Peak grad:  6.4 mmHg AV Vmean:          101.000 cm/s PV Mean grad:  3.0 mmHg AV VTI:            0.292 m AV Peak Grad:      11.3 mmHg AV Mean Grad:      5.0 mmHg LVOT Vmax:         103.00 cm/s LVOT Vmean:        62.300 cm/s LVOT VTI:          0.166 m LVOT/AV VTI ratio: 0.57  AORTA Ao Root diam: 3.30 cm MITRAL VALVE MV Area (PHT): 3.60 cm    SHUNTS MV Area VTI:   1.89 cm    Systemic VTI:  0.17 m MV Peak grad:  3.4 mmHg    Systemic Diam: 1.90 cm MV Mean grad:  1.0 mmHg MV Vmax:       0.92 m/s MV Vmean:      52.7 cm/s MV Decel Time: 211 msec MV E velocity: 59.10 cm/s MV A velocity: 90.80 cm/s MV E/A ratio:  0.65 Kathlyn Sacramento MD Electronically signed by Kathlyn Sacramento MD Signature Date/Time: 04/10/2021/11:30:41 AM    Final     PHYSICAL EXAM  Temp:  [97.6 F (36.4 C)-98.8 F (37.1 C)] 98.2 F (36.8 C) (06/05 0700) Pulse Rate:  [56-86] 56 (06/05 0700) Resp:  [13-18] 13  (06/05 0700) BP: (127-164)/(71-90) 130/73 (06/05 0700) SpO2:  [91 %-93 %] 93 % (06/05 0700)  General - Well nourished, well developed, in no apparent distress.  Ophthalmologic - fundi not visualized due to noncooperation.  Cardiovascular - Regular rhythm and rate.  Mental Status -  Level of arousal and orientation to time, place, and person were intact. Language including expression, repetition, comprehension was assessed and found intact. Naming 2/4 Fund of Knowledge was assessed and was intact.  Cranial Nerves II - XII - II - Visual field intact OU. III, IV, VI - Extraocular movements intact. V - Facial sensation intact bilaterally. VII - moderate  left facial droop. VIII - Hearing & vestibular intact bilaterally. X - Palate elevates symmetrically. XI - Chin turning & shoulder shrug intact bilaterally. XII - Tongue protrusion intact.  Motor Strength - The patient's strength was normal in RUE and BLEs. however left UE 5/5 finger grip and bicep but 4/5 tricep and deltoid, slight pronator drift. Bulk was normal and fasciculations were absent.   Motor Tone - Muscle tone was assessed at the neck and appendages and was normal.  Reflexes - The patient's reflexes were symmetrical in all extremities and she had no pathological reflexes.  Sensory - Light touch, temperature/pinprick were assessed and were symmetrical.    Coordination - The patient had normal movements in the hands with no ataxia or dysmetria.  Tremor was absent.  Gait and Station - deferred.   ASSESSMENT/PLAN [name] is a 65 y.o. female with history of HTN, HLD, CAD s/p stent 2015, stroke in 2014, former smoker, migraine admitted for left sided weakness, fall, imbalance. No tPA given due to [place].    ICH:  right thalamic ICH secondary to HTN and small vessel disease  Resultant left arm weakness mild left facial droop  CT head small right thalamic ICH  Repeat CT stable right thalamic ICH  MRI  Stable  right thalamic ICH, possible occult cavernous malformation of the left frontal lobe with associated developmental venous anomaly.  MRA Multifocal moderate stenosis of the left PCA.  Carotid Doppler  Unremarkable   2D Echo  EF 55-60%  LDL 177  HgbA1c 6.0  SCDs for VTE prophylaxis  No antithrombotic prior to admission, now on No antithrombotic due to [place]. Will recommend to hold for 2 weeks and repeat CT head if stable and BP is well controlled then start ASA for secondary stroke prevention considering her hx of stroke.   Ongoing aggressive stroke risk factor management  Therapy recommendations:  CIR   Disposition:  Pending   Hx of stroke  08/2013 right BG infarct without residue  02/2015 right sided weakness, facial droop, slurry speech with low BP. MRI negative for stroke. MRA and CUS neg. EF 55-60%. A1C 6.0 and LDL 69, discharged with DAPT and lipitor 40  Hypertension . Stable . BP goal < 160 . On labetalol PRN . triamterene HCTZ 37.5-25 mg daily, metoprolol 25 mg twice daily,Amdlodipin 5 mg po daily    Long term BP goal normotensive  Hyperlipidemia  Home meds:  none   LDL 177, goal < 70  Rosovastatin 20 mg qhs  Continue statin at discharge  Dysphagia   Cleared by SLP  Other Stroke Risk Factors  Formal Cigarette smoker, quit for several months now  Coronary artery disease s/p stenting  Migraines  Other Active Problems  Hypokalemia K 3.4 - will supplement via po or cortrak   Will sign off for now, please recall for any questions.  Follow with Neurology clinic as an outpt.   Hospital day # 3  I spent  35 minutes in total face-to-face time with the patient, more than 50% of which was spent in counseling and coordination of care, reviewing test results, images and medication, and discussing the diagnosis, treatment plan and potential prognosis. This patient's care requiresreview of multiple databases, neurological assessment, discussion with family,  other specialists and medical decision making of high complexity.   [place] [facility] 04/13/2021 10:11 AM    To contact Stroke Continuity provider, please refer to [email or web address] After hours, contact General Neurology

## 2021-04-14 NOTE — Progress Notes (Signed)
[facility]                                                                              Patient Demographics  [name], is a 65 y.o. female, DOB - [birth year], TKZ:[id number]  Admit date - 04/10/2021   Admitting Physician Kerney Elbe, MD  Outpatient Primary MD for the patient is Baxter Hire, MD  Outpatient specialists:   LOS - 4  days   Medical records reviewed and are as summarized below:    No chief complaint on file.      Brief summary   Patient is a 65 year old female with history of CAD, bilateral carotid disease, CKD, prior history of CVA in 2014, depression, GERD, hypertension, hyperlipidemia presented as initial transfer from [facility] ICU admission with complaints of left-sided weakness and feeling off balance.  Patient reported that she was in her usual state of health and night before, took Ambien 10 mg, woke up on the morning around 4:30 AM with left-sided weakness, dizziness, difficulty with balance and falling to the left whenever she attempted to ambulate throughout the day.  No other focal neurological deficits. In ED, patient was noted to have elevated BP 184/102, heart rate 70.  Patient had mild left facial weakness, left arm and leg ataxia otherwise no obvious focal neurological deficits. Noncontrast CT showed acute right thalamic hemorrhage, 5 mm right-to-left midline shift. EDP had discussed with neurosurgery who recommended no surgical intervention. EDP discussed with neurology, Dr. Cheral Marker and patient was transferred to [facility]. Patient admitted under neurology service, underwent extensive work-up PT valuation recommended CIR.  [facility] assumed care on 04/12/2021   Assessment & Plan    Principal problem   ICH (intracerebral hemorrhage) ([place]) likely secondary to hypertensive emergency, small vessel disease -Presented with resultant left arm weakness and mild left facial droop. -CT head showed small right thalamic ICH.  Repeat CT showed  stable right thalamic ICH. 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 -MRA showed multifocal moderate stenosis of left PCA -2D echo showed EF of 55 to 60%, carotid Dopplers unremarkable -LDL 177, continue statin -Hemoglobin A1c 6.0 -Passed swallow screen, now on regular diet. -Neurology following, no antithrombotics PTA, now on no antithrombotics due to [place].  Recommend to hold for 2 weeks and repeat CT head.  If stable and BP controlled, then start aspirin for secondary stroke prevention.  -No acute complaints, awaiting CIR  History of prior CVA -In 2014 and 2016 -Management as #1 -Continue statin  Hypertensive emergency, history of prior CAD -Presented with BP of 184/102 with small ICH -Goal BP less than 160 -Continue labetalol as needed with parameters -BP currently stable, continue triamterene HCTZ and metoprolol  GERD -Continue PPI  Anxiety -Continue paroxetine   Code Status: Full CODE STATUS DVT Prophylaxis:  SCD's Start: 04/10/21 2021   Level of Care: Level of care: Progressive Family Communication: Discussed all imaging results, lab results, explained to the patient    Disposition Plan:     Status is: Inpatient  Remains inpatient appropriate because:Inpatient level of care appropriate  due to severity of illness   Dispo: The patient is from: Home              Anticipated d/c is to: CIR              Patient currently is not medically stable to d/c.  Awaiting CIR   Difficult to place patient No      Time Spent in minutes 15 minutes   Procedures:  2D echo, MRI, MRA, carotid Dopplers  Consultants:   Admitted by neurology service on 6/2  Antimicrobials:   Anti-infectives (From admission, onward)   None         Medications  Scheduled Meds: . amLODipine  5 mg Oral Daily  . FLUoxetine  40 mg Oral Daily  . metoprolol tartrate  25 mg Oral  BID  . pantoprazole  40 mg Oral QHS  . rosuvastatin  20 mg Oral Daily  . senna-docusate  1 tablet Oral BID  . triamterene-hydrochlorothiazide  1 tablet Oral Daily   Continuous Infusions: PRN Meds:.acetaminophen **OR** acetaminophen (TYLENOL) oral liquid 160 mg/5 mL **OR** acetaminophen, labetalol      Subjective:   [name] was seen and examined today.  No acute complaints.  Awaiting CIR.  Tolerating diet.  No chest pain, shortness of breath, headache.  No acute events overnight.    Objective:   Vitals:   04/13/21 2124 04/13/21 2335 04/14/21 0400 04/14/21 0700  BP: 133/86  128/79 125/89  Pulse: 65 66 60 66  Resp:      Temp:  98.4 F (36.9 C) 98 F (36.7 C) 97.9 F (36.6 C)  TempSrc:  Oral Oral Oral  SpO2:    98%    Intake/Output Summary (Last 24 hours) at 04/14/2021 1049 Last data filed at 04/14/2021 0700 Gross per 24 hour  Intake --  Output 850 ml  Net -850 ml     Wt Readings from Last 3 Encounters:  04/10/21 66.3 kg  01/27/21 65.3 kg  11/12/20 65.3 kg    Physical Exam  General: Alert and oriented x 3, NAD  Cardiovascular: S1 S2 clear, RRR. No pedal edema b/l  Respiratory: CTAB, no wheezing, rales or rhonchi  Gastrointestinal: Soft, nontender, nondistended, NBS  Ext: no pedal edema bilaterally  Neuro: mild left facial droop, strength 5/5 in upper and lower extremities  Skin: No rashes  Psych: flat affect    Data Reviewed:  I have personally reviewed following labs and imaging studies  Micro Results Recent Results (from the past 240 hour(s))  Resp Panel by RT-PCR (Flu A&B, Covid) Nasopharyngeal Swab     Status: None   Collection Time: 04/09/21 11:54 PM   Specimen: Nasopharyngeal Swab; Nasopharyngeal(NP) swabs in vial transport medium  Result Value Ref Range Status   SARS Coronavirus 2 by RT PCR NEGATIVE NEGATIVE Final    Comment: (NOTE) SARS-CoV-2 target nucleic acids are NOT DETECTED.  The SARS-CoV-2 RNA is generally detectable in upper  respiratory specimens during the acute phase of infection. The lowest concentration of SARS-CoV-2 viral copies this assay can detect is 138 copies/mL. A negative result does not preclude SARS-Cov-2 infection and should not be used as the sole basis for treatment or other patient management decisions. A negative result may occur with  improper specimen collection/handling, submission of specimen other than nasopharyngeal swab, presence of viral mutation(s) within the areas targeted by this assay, and inadequate number of viral copies(<138 copies/mL). A negative result must be combined with clinical observations, patient history, and epidemiological  information. The expected result is Negative.  Fact Sheet for Patients:  EntrepreneurPulse.com.au  Fact Sheet for Healthcare Providers:  IncredibleEmployment.be  This test is no t yet approved or cleared by the [place] FDA and  has been authorized for detection and/or diagnosis of SARS-CoV-2 by FDA under an Emergency Use Authorization (EUA). This EUA will remain  in effect (meaning this test can be used) for the duration of the COVID-19 declaration under Section 564(b)(1) of the Act, 21 U.S.C.section 360bbb-3(b)(1), unless the authorization is terminated  or revoked sooner.       Influenza A by PCR NEGATIVE NEGATIVE Final   Influenza B by PCR NEGATIVE NEGATIVE Final    Comment: (NOTE) The Xpert Xpress SARS-CoV-2/FLU/RSV plus assay is intended as an aid in the diagnosis of influenza from Nasopharyngeal swab specimens and should not be used as a sole basis for treatment. Nasal washings and aspirates are unacceptable for Xpert Xpress SARS-CoV-2/FLU/RSV testing.  Fact Sheet for Patients: EntrepreneurPulse.com.au  Fact Sheet for Healthcare Providers: IncredibleEmployment.be  This test is not yet approved or cleared by the [place] FDA and has been  authorized for detection and/or diagnosis of SARS-CoV-2 by FDA under an Emergency Use Authorization (EUA). This EUA will remain in effect (meaning this test can be used) for the duration of the COVID-19 declaration under Section 564(b)(1) of the Act, 21 U.S.C. section 360bbb-3(b)(1), unless the authorization is terminated or revoked.  Performed at [facility], [address] [place], [place] [address]   MRSA PCR Screening     Status: None   Collection Time: 04/10/21  3:27 AM   Specimen: Nasopharyngeal  Result Value Ref Range Status   MRSA by PCR NEGATIVE NEGATIVE Final    Comment:        The GeneXpert MRSA Assay (FDA approved for NASAL specimens only), is one component of a comprehensive MRSA colonization surveillance program. It is not intended to diagnose MRSA infection nor to guide or monitor treatment for MRSA infections. Performed at [facility], [address] [place], [place] [address]   MRSA PCR Screening     Status: None   Collection Time: 04/10/21 10:36 PM   Specimen: Nasopharyngeal  Result Value Ref Range Status   MRSA by PCR NEGATIVE NEGATIVE Final    Comment:        The GeneXpert MRSA Assay (FDA approved for NASAL specimens only), is one component of a comprehensive MRSA colonization surveillance program. It is not intended to diagnose MRSA infection nor to guide or monitor treatment for MRSA infections. Performed at [facility], [place] [address], [place], [place] [address]     Radiology Reports CT HEAD WO CONTRAST  Result Date: 04/10/2021 CLINICAL DATA:  Intracranial hemorrhage follow up EXAM: CT HEAD WITHOUT CONTRAST TECHNIQUE: Contiguous axial images were obtained from the base of the skull through the vertex without intravenous contrast. COMPARISON:  04/10/2021 at 2:50 a.m. FINDINGS: Brain: Unchanged size of intraparenchymal hematoma centered in the right thalamus. No new site of hemorrhage. There is periventricular  hypoattenuation compatible with chronic microvascular disease. Mild generalized volume loss. Unchanged size and configuration of the ventricles. Chronic small vessel infarct of the left basal ganglia. Vascular: No hyperdense vessel or unexpected calcification. Skull: Normal. Negative for fracture or focal lesion. Sinuses/Orbits: No acute finding. Other: None IMPRESSION: Unchanged size of intraparenchymal hematoma centered in the right thalamus. Electronically Signed   By: Ulyses Jarred M.D.   On: 04/10/2021 23:15   CT HEAD WO CONTRAST  Result Date: 04/10/2021 CLINICAL DATA:  Intracranial hemorrhage follow-up EXAM: CT HEAD WITHOUT CONTRAST TECHNIQUE: Contiguous axial images were obtained from the base of the skull through the vertex without intravenous contrast. COMPARISON:  04/09/2021 FINDINGS: Brain: Unchanged size of intraparenchymal hemorrhage in the right thalamus. Trace leftward midline shift is unchanged. Old left caudate infarct. There is periventricular hypoattenuation compatible with chronic microvascular disease. Vascular: No abnormal hyperdensity of the major intracranial arteries or dural venous sinuses. No intracranial atherosclerosis. Skull: The visualized skull base, calvarium and extracranial soft tissues are normal. Sinuses/Orbits: No fluid levels or advanced mucosal thickening of the visualized paranasal sinuses. No mastoid or middle ear effusion. The orbits are normal. IMPRESSION: Unchanged size of right thalamic intraparenchymal hemorrhage with trace leftward midline shift. Electronically Signed   By: Ulyses Jarred M.D.   On: 04/10/2021 03:12   CT HEAD WO CONTRAST  Result Date: 04/09/2021 CLINICAL DATA:  TIA. EXAM: CT HEAD WITHOUT CONTRAST TECHNIQUE: Contiguous axial images were obtained from the base of the skull through the vertex without intravenous contrast. COMPARISON:  05/18/2020 FINDINGS: Brain: There is hemorrhage within the right thalamus measuring 1.8 x 1.2 x 1.3 cm. Surrounding  vasogenic edema. 5 mm of associated right to left midline shift. No hydrocephalus. Old left basal ganglia lacunar infarct. Chronic small vessel disease throughout the deep white matter. Vascular: No hyperdense vessel or unexpected calcification. Skull: No acute calvarial abnormality. Sinuses/Orbits: Visualized paranasal sinuses and mastoids clear. Orbital soft tissues unremarkable. Other: None IMPRESSION: Acute right thalamic hemorrhage. 5 mm of right to left midline shift. Old left basal ganglia lacunar infarct. Critical Value/emergent results were called by telephone at the time of interpretation on 04/09/2021 at 10:38 pm to provider [facility] , who verbally acknowledged these results. Electronically Signed   By: Rolm Baptise M.D.   On: 04/09/2021 22:40   MR MRA HEAD WO CONTRAST  Result Date: 04/10/2021 CLINICAL DATA:  Intraparenchymal hemorrhage of the right thalamus EXAM: MRA HEAD WITHOUT CONTRAST TECHNIQUE: Angiographic images of the Circle of Willis were acquired using MRA technique without intravenous contrast. COMPARISON:  No pertinent prior exam. FINDINGS: POSTERIOR CIRCULATION: --Vertebral arteries: Normal --Inferior cerebellar arteries: Poor visualization right PICA. Normal left PICA. --Basilar artery: Normal. --Superior cerebellar arteries: Normal. --Posterior cerebral arteries: Multifocal moderate stenosis of the left PCA. ANTERIOR CIRCULATION: --Intracranial internal carotid arteries: Normal. --Anterior cerebral arteries (ACA): Normal. --Middle cerebral arteries (MCA): Normal. ANATOMIC VARIANTS: None IMPRESSION: 1. No proximal large vessel occlusion or high-grade stenosis. 2. Multifocal moderate stenosis of the left PCA. Electronically Signed   By: Ulyses Jarred M.D.   On: 04/10/2021 23:36   MR BRAIN WO CONTRAST  Result Date: 04/10/2021 CLINICAL DATA:  Intracranial hemorrhage EXAM: MRI HEAD WITHOUT CONTRAST TECHNIQUE: Multiplanar, multiecho pulse sequences of the brain and surrounding structures  were obtained without intravenous contrast. COMPARISON:  Correlation made with recent CT imaging.  MRI 2016 FINDINGS: Brain: Acute hemorrhage within the right thalamus is again identified with surrounding edema effacing the adjacent third ventricle. There is no intraventricular extension. No hydrocephalus. Chronic infarct of the left basal ganglia and adjacent white matter. Additional patchy and confluent areas of T2 hyperintensity in the supratentorial greater than pontine white matter are nonspecific but probably reflect moderate chronic microvascular ischemic changes. There is a focus of susceptibility in the left frontal subcortical white matter; adjacent linear susceptibility could reflect a developmental venous anomaly and therefore this may represent a cavernous malformation. There is no intracranial mass, noting that there is limited evaluation for underlying  right thalamic lesion without contrast. There is no hydrocephalus or extra-axial fluid collection. Vascular: Major vessel flow voids at the skull base are preserved. Skull and upper cervical spine: Normal marrow signal is preserved. Sinuses/Orbits: Paranasal sinuses are aerated. Orbits are unremarkable. Other: Sella is unremarkable.  Mastoid air cells are clear. IMPRESSION: Acute right thalamic hemorrhage with mild mass effect. No intraventricular extension. Moderate chronic microvascular ischemic changes. 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 Electronically Signed   By: Macy Mis M.D.   On: 04/10/2021 14:02   US Carotid Bilateral (at [facility] and [facility] only)  Result Date: 04/10/2021 CLINICAL DATA:  Hyperlipidemia, stroke symptoms EXAM: BILATERAL CAROTID DUPLEX ULTRASOUND TECHNIQUE: Pearline Cables scale imaging, color Doppler and duplex ultrasound were performed of bilateral carotid and vertebral arteries in the neck. COMPARISON:  None. FINDINGS: Criteria: Quantification of  carotid stenosis is based on velocity parameters that correlate the residual internal carotid diameter with NASCET-based stenosis levels, using the diameter of the distal internal carotid lumen as the denominator for stenosis measurement. The following velocity measurements were obtained: RIGHT ICA: 68/17 cm/sec CCA: 84/13 cm/sec SYSTOLIC ICA/CCA RATIO:  0.8 ECA: 90 cm/sec LEFT ICA: 64/10 cm/sec CCA: 24/40 cm/sec SYSTOLIC ICA/CCA RATIO:  0.8 ECA: 113 cm/sec RIGHT CAROTID ARTERY: Minor echogenic shadowing plaque formation. No hemodynamically significant right ICA stenosis, velocity elevation, or turbulent flow. Degree of narrowing less than 50%. RIGHT VERTEBRAL ARTERY:  Normal antegrade flow LEFT CAROTID ARTERY: Similar scattered minor echogenic plaque formation. No hemodynamically significant left ICA stenosis, velocity elevation, or turbulent flow. LEFT VERTEBRAL ARTERY:  Normal antegrade flow IMPRESSION: Bilateral carotid atherosclerosis. No hemodynamically significant ICA stenosis. Degree of narrowing less than 50% bilaterally by ultrasound criteria. Patent antegrade vertebral flow bilaterally Electronically Signed   By: Jerilynn Mages.  Shick M.D.   On: 04/10/2021 14:46   ECHOCARDIOGRAM COMPLETE  Result Date: 04/10/2021    ECHOCARDIOGRAM REPORT   Patient Name:   LIKISHA ALLES Date of Exam: 04/10/2021 Medical Rec #:  [id number]     Height:       62.0 in Accession #:    [id number]    Weight:       146.2 lb Date of Birth:  [birth year]     BSA:          1.673 m Patient Age:    36 years      BP:           114/60 mmHg Patient Gender: F             HR:           73 bpm. Exam Location:  [facility] Procedure: 2D Echo, Color Doppler and Cardiac Doppler Indications:     I63.9 Stroke  History:         Patient has prior history of Echocardiogram examinations.                  Stroke; Risk Factors:Current Smoker, Hypertension, Dyslipidemia                  and HCL.  Sonographer:     Charmayne Sheer RDCS (AE) Referring Phys:  [place] Diagnosing Phys: Kathlyn Sacramento MD  Sonographer Comments: Suboptimal apical window. Global longitudinal strain was attempted. IMPRESSIONS  1. Left ventricular ejection fraction, by estimation, is 55 to 60%. The left ventricle has normal function. The left ventricle has no regional wall motion abnormalities. Left ventricular diastolic parameters are consistent with  Grade I diastolic dysfunction (impaired relaxation). The global longitudinal strain is normal.  2. Right ventricular systolic function is normal. The right ventricular size is normal. Tricuspid regurgitation signal is inadequate for assessing PA pressure.  3. Trivial pericardial effusion is present. The pericardial effusion is circumferential.  4. The mitral valve is normal in structure. No evidence of mitral valve regurgitation. No evidence of mitral stenosis.  5. The aortic valve is normal in structure. Aortic valve regurgitation is mild. Mild aortic valve sclerosis is present, with no evidence of aortic valve stenosis.  6. The inferior vena cava is normal in size with greater than 50% respiratory variability, suggesting right atrial pressure of 3 mmHg. FINDINGS  Left Ventricle: Left ventricular ejection fraction, by estimation, is 55 to 60%. The left ventricle has normal function. The left ventricle has no regional wall motion abnormalities. The global longitudinal strain is normal. The left ventricular internal cavity size was normal in size. There is no left ventricular hypertrophy. Left ventricular diastolic parameters are consistent with Grade I diastolic dysfunction (impaired relaxation). Right Ventricle: The right ventricular size is normal. No increase in right ventricular wall thickness. Right ventricular systolic function is normal. Tricuspid regurgitation signal is inadequate for assessing PA pressure. Left Atrium: Left atrial size was normal in size. Right Atrium: Right atrial size was normal in size. Pericardium: Trivial pericardial  effusion is present. The pericardial effusion is circumferential. Mitral Valve: The mitral valve is normal in structure. No evidence of mitral valve regurgitation. No evidence of mitral valve stenosis. MV peak gradient, 3.4 mmHg. The mean mitral valve gradient is 1.0 mmHg. Tricuspid Valve: The tricuspid valve is normal in structure. Tricuspid valve regurgitation is not demonstrated. No evidence of tricuspid stenosis. Aortic Valve: The aortic valve is normal in structure. Aortic valve regurgitation is mild. Mild aortic valve sclerosis is present, with no evidence of aortic valve stenosis. Aortic valve mean gradient measures 5.0 mmHg. Aortic valve peak gradient measures 11.3 mmHg. Aortic valve area, by VTI measures 1.61 cm. Pulmonic Valve: The pulmonic valve was normal in structure. Pulmonic valve regurgitation is mild. No evidence of pulmonic stenosis. Aorta: The aortic root is normal in size and structure. Venous: The inferior vena cava is normal in size with greater than 50% respiratory variability, suggesting right atrial pressure of 3 mmHg. IAS/Shunts: No atrial level shunt detected by color flow Doppler.  LEFT VENTRICLE PLAX 2D LVIDd:         4.60 cm  Diastology LVIDs:         3.10 cm  LV e' medial:    6.42 cm/s LV PW:         1.20 cm  LV E/e' medial:  9.2 LV IVS:        0.90 cm  LV e' lateral:   6.20 cm/s LVOT diam:     1.90 cm  LV E/e' lateral: 9.5 LV SV:         47 LV SV Index:   28 LVOT Area:     2.84 cm  RIGHT VENTRICLE RV Basal diam:  2.50 cm LEFT ATRIUM           Index       RIGHT ATRIUM           Index LA diam:      3.40 cm 2.03 cm/m  RA Area:     10.30 cm LA Vol (A2C): 40.5 ml 24.21 ml/m RA Volume:   17.80 ml  10.64 ml/m LA Vol (A4C): 41.2 ml  24.62 ml/m  AORTIC VALVE                    PULMONIC VALVE AV Area (Vmax):    1.74 cm     PV Vmax:       1.26 m/s AV Area (Vmean):   1.75 cm     PV Vmean:      84.600 cm/s AV Area (VTI):     1.61 cm     PV VTI:        0.275 m AV Vmax:           168.00  cm/s  PV Peak grad:  6.4 mmHg AV Vmean:          101.000 cm/s PV Mean grad:  3.0 mmHg AV VTI:            0.292 m AV Peak Grad:      11.3 mmHg AV Mean Grad:      5.0 mmHg LVOT Vmax:         103.00 cm/s LVOT Vmean:        62.300 cm/s LVOT VTI:          0.166 m LVOT/AV VTI ratio: 0.57  AORTA Ao Root diam: 3.30 cm MITRAL VALVE MV Area (PHT): 3.60 cm    SHUNTS MV Area VTI:   1.89 cm    Systemic VTI:  0.17 m MV Peak grad:  3.4 mmHg    Systemic Diam: 1.90 cm MV Mean grad:  1.0 mmHg MV Vmax:       0.92 m/s MV Vmean:      52.7 cm/s MV Decel Time: 211 msec MV E velocity: 59.10 cm/s MV A velocity: 90.80 cm/s MV E/A ratio:  0.65 Kathlyn Sacramento MD Electronically signed by Kathlyn Sacramento MD Signature Date/Time: 04/10/2021/11:30:41 AM    Final     Lab Data:  CBC: Recent Labs  Lab 04/09/21 2218 04/10/21 0414 04/10/21 2109 04/13/21 0310  WBC 11.2* 13.6* 10.4 10.4  NEUTROABS 7.7  --   --   --   HGB 13.1 13.1 12.1 14.6  HCT 38.5 38.0 36.3 43.6  MCV 90.2 89.2 91.0 90.8  PLT 273 288 277 967   Basic Metabolic Panel: Recent Labs  Lab 04/09/21 2218 04/10/21 0414 04/10/21 2109 04/13/21 0310  NA 140 141 137 135  K 3.7 3.5 3.4* 3.8  CL 108 109 105 100  CO2 22 22 24 26   GLUCOSE 114* 127* 114* 112*  BUN 18 23 15 15   CREATININE 0.84 0.94 0.90 1.06*  CALCIUM 9.3 9.1 9.0 9.9  MG  --  1.9  --   --    GFR: Estimated Creatinine Clearance: 48.5 mL/min (A) (by C-G formula based on SCr of 1.06 mg/dL (H)). Liver Function Tests: Recent Labs  Lab 04/09/21 2218 04/10/21 2109  AST 24 26  ALT 18 19  ALKPHOS 91 82  BILITOT 0.7 1.2  PROT 7.7 6.5  ALBUMIN 3.9 3.4*   No results for input(s): LIPASE, AMYLASE in the last 168 hours. No results for input(s): AMMONIA in the last 168 hours. Coagulation Profile: Recent Labs  Lab 04/09/21 2218  INR 1.0   Cardiac Enzymes: No results for input(s): CKTOTAL, CKMB, CKMBINDEX, TROPONINI in the last 168 hours. BNP (last 3 results) No results for input(s): PROBNP in  the last 8760 hours. HbA1C: No results for input(s): HGBA1C in the last 72 hours. CBG: Recent Labs  Lab 04/09/21 2251 04/10/21 0308 04/10/21 1520  GLUCAP 117*  142* 101*   Lipid Profile: No results for input(s): CHOL, HDL, LDLCALC, TRIG, CHOLHDL, LDLDIRECT in the last 72 hours. Thyroid Function Tests: No results for input(s): TSH, T4TOTAL, FREET4, T3FREE, THYROIDAB in the last 72 hours. Anemia Panel: No results for input(s): VITAMINB12, FOLATE, FERRITIN, TIBC, IRON, RETICCTPCT in the last 72 hours. Urine analysis:    Component Value Date/Time   COLORURINE YELLOW (A) 11/04/2020 1020   APPEARANCEUR HAZY (A) 11/04/2020 1020   LABSPEC 1.025 11/04/2020 1020   PHURINE 5.0 11/04/2020 1020   GLUCOSEU NEGATIVE 11/04/2020 1020   HGBUR NEGATIVE 11/04/2020 1020   BILIRUBINUR NEGATIVE 11/04/2020 1020   KETONESUR NEGATIVE 11/04/2020 1020   PROTEINUR NEGATIVE 11/04/2020 1020   UROBILINOGEN 0.2 02/18/2015 0410   NITRITE NEGATIVE 11/04/2020 1020   LEUKOCYTESUR NEGATIVE 11/04/2020 1020     Chrisha Vogel M.D. [facility] 04/14/2021, 10:49 AM  Available via Epic secure chat 7am-7pm After 7 pm, please refer to night coverage provider listed on amion.

## 2021-04-14 NOTE — Care Management Important Message (Signed)
Important Message  Patient Details  Name: [name] MRN: [id number] Date of Birth: [birth year]   Medicare Important Message Given:  Yes     Orbie Pyo 04/14/2021, 4:35 PM

## 2021-04-14 NOTE — NC FL2 (Signed)
[place] LEVEL OF CARE SCREENING TOOL     IDENTIFICATION  Patient Name: [name] Birthdate: [birth year] Sex: female Admission Date (Current Location): 04/10/2021  [facility] and [place] Number:  Engineering geologist and Address:  [facility], [place] [address], [place], [place] [address]      Provider Number: [id number]  Attending Physician Name and Address:  Mendel Corning, MD  Relative Name and Phone Number:       Current Level of Care: Hospital Recommended Level of Care: [place] Prior Approval Number:    Date Approved/Denied:   PASRR Number: [id number]  Discharge Plan:      Current Diagnoses: Patient Active Problem List   Diagnosis Date Noted  . Thalamic hemorrhage ([place]) 04/10/2021  . ICH (intracerebral hemorrhage) ([place]) 04/10/2021  . Pelvic pain-right lower quadrant 07/14/2018  . Right lower quadrant abdominal pain 06/30/2018  . Bilateral carotid artery stenosis 03/12/2018  . Chronic systolic CHF (congestive heart failure) ([place]) 03/12/2018  . Status post total knee replacement using cement, left 10/06/2016  . History of coronary artery stent placement   . Pure hypercholesterolemia   . Recurrent UTI 02/11/2016  . Menopause 02/11/2016  . Angina pectoris ([place]) 01/07/2016  . Syncope 07/11/2015  . Orthostatic hypotension 02/19/2015  . Dehydration 02/19/2015  . Hypokalemia 02/19/2015  . Stroke ([place]) 02/18/2015  . Tobacco abuse 02/18/2015  . History of stroke 07/18/2014  . Vertigo 07/18/2014  . Bilateral wrist pain 07/18/2014  . Atypical chest pain 06/05/2014  . Coronary artery disease of native artery of native heart with stable angina pectoris ([place]) 01/19/2014  . Carotid arterial disease ([place]) 01/19/2014  . Depression 01/16/2014  . Memory loss 01/16/2014  . Right sided weakness 08/10/2013  . Essential hypertension 08/10/2013  . Mixed hyperlipidemia 08/10/2013  . History of MI (myocardial infarction)  08/10/2013  . Stroke, acute, embolic ([place]) 81/15/7262    Orientation RESPIRATION BLADDER Height & Weight     Self,Time,Situation,Place  Normal External catheter,Incontinent Weight:   Height:     BEHAVIORAL SYMPTOMS/MOOD NEUROLOGICAL BOWEL NUTRITION STATUS      Incontinent Diet (please see discharge summary)  AMBULATORY STATUS COMMUNICATION OF NEEDS Skin   Limited Assist Verbally Normal                       Personal Care Assistance Level of Assistance  Bathing,Feeding,Dressing Bathing Assistance: Limited assistance Feeding assistance: Independent Dressing Assistance: Limited assistance     Functional Limitations Info  Sight,Hearing,Speech Sight Info: Adequate Hearing Info: Adequate Speech Info: Adequate    SPECIAL CARE FACTORS FREQUENCY  PT (By licensed PT),OT (By licensed OT)     PT Frequency: 5x per week OT Frequency: 5x per week            Contractures Contractures Info: Not present    Additional Factors Info  Code Status,Allergies Code Status Info: FULL Allergies Info: Codeine Sulfate,Penicillins           Current Medications (04/14/2021):  This is the current hospital active medication list Current Facility-Administered Medications  Medication Dose Route Frequency Provider Last Rate Last Admin  . acetaminophen (TYLENOL) tablet 650 mg  650 mg Oral Q4H PRN Kerney Elbe, MD       Or  . acetaminophen (TYLENOL) 160 MG/5ML solution 650 mg  650 mg Per Tube Q4H PRN Kerney Elbe, MD       Or  . acetaminophen (TYLENOL) suppository 650 mg  650 mg  Rectal Q4H PRN Kerney Elbe, MD      . amLODipine (NORVASC) tablet 5 mg  5 mg Oral Daily Al Masry, Mahmoud, MD   5 mg at 04/14/21 1024  . FLUoxetine (PROZAC) capsule 40 mg  40 mg Oral Daily Rosalin Hawking, MD   40 mg at 04/14/21 1023  . labetalol (NORMODYNE) injection 5-10 mg  5-10 mg Intravenous Q2H PRN Rosalin Hawking, MD   10 mg at 04/11/21 1125  . metoprolol tartrate (LOPRESSOR) tablet 25 mg  25 mg Oral BID Rosalin Hawking, MD   25 mg at 04/14/21 1023  . pantoprazole (PROTONIX) EC tablet 40 mg  40 mg Oral QHS Rosalin Hawking, MD   40 mg at 04/13/21 2124  . rosuvastatin (CRESTOR) tablet 20 mg  20 mg Oral Daily Rosalin Hawking, MD   20 mg at 04/14/21 1022  . senna-docusate (Senokot-S) tablet 1 tablet  1 tablet Oral BID Kerney Elbe, MD   1 tablet at 04/14/21 1023  . triamterene-hydrochlorothiazide (MAXZIDE-25) 37.5-25 MG per tablet 1 tablet  1 tablet Oral Daily Rosalin Hawking, MD   1 tablet at 04/14/21 1022     Discharge Medications: Please see discharge summary for a list of discharge medications.  Relevant Imaging Results:  Relevant Lab Results:   Additional Information SSN [id number] patient states she has received covid vaccine but no booster shot  Vinie Sill, LCSW

## 2021-04-14 NOTE — TOC Progression Note (Addendum)
Transition of Care [facility]) - Progression Note    Patient Details  Name: [name] MRN: [id number] Date of Birth: 1956-03-14  Transition of Care [facility]) CM/SW [place], [place] Phone Number: 04/14/2021, 3:08 PM  Clinical Narrative:     CSW visit with patient. CSW introduced self and explained role. CSW discussed SNF as backup plan to CIR. Patient states understanding. Patient is agreeable to short term rehab at SNF as back up plan. CSW explained the SNF process. Patient states no preferred SNF choice. Patient states her physical address is [place] in [place], [place] [address]. Patient states she has received the covid vaccine but no booster shot. Patient expressed no questions or concerns at this time.   CSW will continue to follow for final disposition plan CIR or  SNF. CSW will send out SNF referrals & provide bed offers once available   Thurmond Butts, MSW, LCSW Clinical Social Worker   Expected Discharge Plan: [place] (vs SNF) Barriers to Discharge: Continued Medical Work up  Expected Discharge Plan and Services Expected Discharge Plan: [place] (vs SNF) In-house Referral: Clinical Social Work Discharge Planning Services: CM Consult                                           Social Determinants of Health (SDOH) Interventions    Readmission Risk Interventions No flowsheet data found.

## 2021-04-14 NOTE — Progress Notes (Signed)
Inpatient Rehab Admissions Coordinator:  Spoke with pt's sister Terri regarding pt's progress with therapy and physiatrist goal of 24/7 supervision.  She said she would discuss this with other family members so they could make a decision regarding ongoing therapy/placement.  TOC made aware.  Will continue to follow.  Gayland Curry, [place], [place] Admissions Coordinator [phone]

## 2021-04-14 NOTE — Consult Note (Signed)
Physical Medicine and Rehabilitation Consult Reason for Consult: Difficulty with ambulation/left side weakness Referring Physician: Dr.Rai   HPI: [name] is a 65 y.o. right-handed female with history of CAD , hypertension, hyperlipidemia,  CVA 08/2013, tobacco abuse, right total knee arthroplasty 11/12/2020 and had been on Eliquis.  Per chart review patient lives alone.  1 level home with level entry.  Independent with ADLs.  Household and community mobilization without assistive device.  Presented [facility]/10/[facility] to [facility] with left-sided weakness and dizziness of acute onset.  CT/MRI showed acute right thalamic hemorrhage with mild mass-effect.  No intraventricular extension.  Carotid Dopplers with no ICA stenosis.  MRA with no proximal large vessel occlusion or high-grade stenosis.  Echocardiogram with ejection fraction of 55 to 82% grade 1 diastolic dysfunction.  Admission chemistries unremarkable except glucose 127, hemoglobin A1c 6.0, WBC 13,600.  Neurology follow-up conservative care close monitoring of blood pressure.  Current plan is to hold on any antithrombotics at this time due to [place] repeat CT scan in 2 weeks question possibilities of beginning low-dose aspirin.  Tolerating a regular diet.  Therapy evaluations completed due to patient's left-sided weakness recommendations of physical medicine rehab consult.  Patient states she feels tired but otherwise has no complaints today.  Dozes off intermittently  Review of Systems  Constitutional: Negative for chills and fever.  HENT: Negative for hearing loss.   Eyes: Negative for blurred vision and double vision.  Respiratory: Negative for cough and shortness of breath.   Cardiovascular: Negative for palpitations.  Gastrointestinal: Positive for constipation. Negative for heartburn, nausea and vomiting.       GERD  Genitourinary: Negative for dysuria, flank pain and hematuria.  Musculoskeletal: Positive for joint pain and myalgias.   Skin: Negative for rash.  Neurological: Positive for dizziness, weakness and headaches.  Psychiatric/Behavioral: Positive for depression. The patient has insomnia.   All other systems reviewed and are negative.  Past Medical History:  Diagnosis Date  . Anxiety   . Arthritis   . CAD (coronary artery disease)    a. 03/2013 NSTEMI/Cath: 100 RCA, EF 45%->Med Rx;  b. 12/2013 Cath/PCI: LAD 27m, 30d, D1 80, D2 60, LCX nl, RCA 50ost/p, 28m, 95d (2.5x33 Xience DES);  c. 05/2014 Lexi MV: EF 68%, no ischemia; d. stress echo 12/2015 no ischemia @ max exercise (did not achieve target)  . Carotid disease, bilateral ([place])    a. 08/2013 Carotid U/S: 1-39% bilat ICA stenosis.  . Chronic kidney disease    Chronic kidney infections. Takes daily preventative.  . Complication of anesthesia   . CVA (cerebral vascular accident) ([place])    a. 08/2013.  Marland Kitchen CVA (cerebral vascular accident) ([place])   . Decreased libido   . Depression   . GERD (gastroesophageal reflux disease)   . History of 2019 novel coronavirus disease (COVID-19) 07/2019  . History of recurrent UTIs   . Hypercholesteremia   . Hyperlipemia   . Hypertension   . Insomnia   . Ischemic cardiomyopathy    a. 03/2013 Echo: EF 30-35%, mod dil LA, mod-sev MR, mod TR;  b. 08/2013 Echo EF 60-65%, mild AI.  Marland Kitchen Menopause   . Migraines   . Myocardial infarction ([place])   . PONV (postoperative nausea and vomiting)    If anesthsia is given slowly, pt will not throw up, if given quickly, pt will have nausea and vomiting.  . Right lower quadrant pain   . Syncope and collapse   . Tobacco abuse   .  Vaginal atrophy    Past Surgical History:  Procedure Laterality Date  . CARDIAC CATHETERIZATION  01/01/2014  . CARDIAC CATHETERIZATION  03/16/2013  . CARDIAC CATHETERIZATION  12/2013  . CARDIAC CATHETERIZATION N/A 08/19/2016   Procedure: Left Heart Cath and Coronary Angiography;  Surgeon: Minna Merritts, MD;  Location: [facility];  Service:  Cardiovascular;  Laterality: N/A;  . CESAREAN SECTION WITH BILATERAL TUBAL LIGATION    . CORONARY ANGIOPLASTY WITH STENT PLACEMENT  01/01/2014   95% lesion with a drug eluting stent to the distal RCA.  Marland Kitchen ENDOMETRIAL ABLATION    . KNEE ARTHROSCOPY  11/27/2006   left knee   . OOPHORECTOMY    . TOTAL KNEE ARTHROPLASTY Left 10/06/2016   Procedure: TOTAL KNEE ARTHROPLASTY;  Surgeon: Corky Mull, MD;  Location: [facility] ORS;  Service: Orthopedics;  Laterality: Left;  . TOTAL KNEE ARTHROPLASTY Right 11/12/2020   Procedure: TOTAL KNEE ARTHROPLASTY;  Surgeon: Corky Mull, MD;  Location: [facility] ORS;  Service: Orthopedics;  Laterality: Right;  . TUBAL LIGATION     Family History  Problem Relation Age of Onset  . Hypertension Mother   . Stroke Mother   . Hyperlipidemia Mother   . Breast cancer Paternal Grandmother   . Colon cancer Neg Hx   . Ovarian cancer Neg Hx   . Heart disease Neg Hx   . Diabetes Neg Hx    Social History:  reports that she has been smoking cigarettes. She has a 30.00 pack-year smoking history. She has never used smokeless tobacco. She reports current alcohol use. She reports that she does not use drugs. Allergies:  Allergies  Allergen Reactions  . Codeine Sulfate Other (See Comments)    sick  . Penicillins Nausea And Vomiting    Has patient had a PCN reaction causing immediate rash, facial/tongue/throat swelling, SOB or lightheadedness with hypotension: No Has patient had a PCN reaction causing severe rash involving mucus membranes or skin necrosis: No Has patient had a PCN reaction that required hospitalization No Has patient had a PCN reaction occurring within the last 10 years: No If all of the above answers are "NO", then may proceed with Cephalosporin use.    Medications Prior to Admission  Medication Sig Dispense Refill  . zolpidem (AMBIEN) 10 MG tablet Take 10 mg by mouth at bedtime as needed for sleep.    Marland Kitchen acetaminophen (TYLENOL) 500 MG tablet Take 500 mg by  mouth every 6 (six) hours as needed for moderate pain or mild pain.    Marland Kitchen amLODipine (NORVASC) 10 MG tablet Take 1 tablet (10 mg total) by mouth daily. (Patient not taking: No sig reported)    . apixaban (ELIQUIS) 2.5 MG TABS tablet Take 2.5 mg by mouth 2 (two) times daily.    Marland Kitchen aspirin EC 81 MG tablet Take 81 mg by mouth daily. Swallow whole.    Marland Kitchen buPROPion (WELLBUTRIN SR) 150 MG 12 hr tablet Take 150 mg by mouth 2 (two) times daily.    . butalbital-acetaminophen-caffeine (FIORICET WITH CODEINE) 50-325-40-30 MG capsule Take 1 capsule by mouth every 4 (four) hours as needed for headache.    . ciprofloxacin (CIPRO) 250 MG tablet Take 250 mg by mouth 2 (two) times daily.    Marland Kitchen FLUoxetine (PROZAC) 40 MG capsule Take 40 mg by mouth daily.    . furosemide (LASIX) 20 MG tablet Take 20 mg by mouth as needed (fluid retention).    . metoprolol succinate (TOPROL-XL) 50 MG 24 hr tablet  Take 50 mg by mouth daily. Take with or immediately following a meal.    . metoprolol tartrate (LOPRESSOR) 25 MG tablet Take 25 mg by mouth daily.    . nitroGLYCERIN (NITROSTAT) 0.4 MG SL tablet Place 0.4 mg under the tongue every 5 (five) minutes as needed for chest pain.    Marland Kitchen ondansetron (ZOFRAN) 4 MG tablet Take 4 mg by mouth every 8 (eight) hours as needed for nausea or vomiting.    . pantoprazole (PROTONIX) 40 MG tablet Take 1 tablet (40 mg total) by mouth at bedtime.    . rosuvastatin (CRESTOR) 20 MG tablet Take 20 mg by mouth daily.    Marland Kitchen triamterene-hydrochlorothiazide (DYAZIDE) 37.5-25 MG capsule Take 1 capsule by mouth daily.      Home: Home Living Family/patient expects to be discharged to:: Private residence Living Arrangements: Alone Available Help at Discharge: Family,Available PRN/intermittently Type of Home: [place] Access: Level entry [place]: One level Bathroom Shower/Tub: Chiropodist: [place] Accessibility: Yes Home Equipment: Walker - 2 wheels,Bedside  commode Additional Comments: have not worked since AUg[place] does not returnto work. Grandson 27 yo named Grayce Sessions  Lives With: Alone  Functional History: Prior Function Level of Independence: Independent Comments: Indep with ADLs, household and community mobilization without assist device; denies fall history. Functional Status:  Mobility: Bed Mobility Overal bed mobility: Needs Assistance Bed Mobility: Sidelying to Sit Sidelying to sit: Supervision Supine to sit: Min guard Sit to supine: Min guard General bed mobility comments: able to come to sitting from the Right side Transfers Overall transfer level: Needs assistance Equipment used: Rolling walker (2 wheeled) Transfers: Sit to/from [employer] Sit to Stand: Min assist Stand pivot transfers: Min assist General transfer comment: simulated toilete transfer with transfer to recliner.  took approx. 4 steps. required cues for rw management, completing full turn before attemtping sit down, and reaching for arm rests prior to sitting down Ambulation/Gait Ambulation/Gait assistance: Min assist,+2 safety/equipment Gait Distance (Feet): 4 Feet Gait Pattern/deviations: Step-to pattern,Decreased stride length,Shuffle General Gait Details: Had tech present for safety due to orthostatic in am with OT.  Took steps to chair with small step length and decreased foot clearance. Min A to steady, fatigued esaily Gait velocity: decreased    ADL: ADL Overall ADL's : Needs assistance/impaired Eating/Feeding: NPO Eating/Feeding Details (indicate cue type and reason): coughing on salvia in session x2 Grooming: Oral care,Sitting,Set up Lower Body Dressing: Set up,Sitting/lateral leans Lower Body Dressing Details (indicate cue type and reason): able to don shoes in sitting (slip ons) Toilet Transfer: Minimal assistance,Ambulation,RW,Cueing for sequencing,Cueing for safety Toilet Transfer Details (indicate cue type and reason): simulated  with transfer to recliner.  took approx. 4 steps. required cues for rw management, completing full turn before attemtping sit down, and reaching for arm rests prior to sitting down Functional mobility during ADLs: Minimal assistance,Rolling walker,Cueing for safety,Cueing for sequencing General ADL Comments: lb dressing seated, grooming task seated, simulated toilet transfer in room  Cognition: Cognition Overall Cognitive Status: No family/caregiver present to determine baseline cognitive functioning Arousal/Alertness: Lethargic Orientation Level: Oriented to person,Oriented to place,Disoriented to time,Disoriented to situation Attention: Focused,Sustained Focused Attention: Impaired Focused Attention Impairment: Verbal basic Sustained Attention: Impaired Sustained Attention Impairment: Verbal basic Immediate Memory Recall: Sock,Blue,Bed Memory Recall Sock: Not able to recall Memory Recall Blue: With Cue Memory Recall Bed: Not able to recall Awareness: Impaired Awareness Impairment: Emergent impairment Problem Solving: Impaired Problem Solving Impairment: Verbal complex Executive Function: Sequencing,Organizing Sequencing: Impaired Sequencing Impairment:  Functional complex Organizing: Impaired Organizing Impairment: Functional complex Safety/Judgment: Impaired Cognition Arousal/Alertness: Awake/alert Behavior During Therapy: Flat affect Overall Cognitive Status: No family/caregiver present to determine baseline cognitive functioning General Comments: Pt with delayed responses.  L Inattention but can look left with cues. 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  when asked about hobbies stated cooking. when asked her favorite things to cook or favorite recipes her family likes her to make she just kept saying "i dont know".  asked about where she go her shoes. took several minutes.  she said "im  thinking" then said "i dont remember the name, but it was a store"  Blood pressure 128/79, pulse 60, temperature 98 F (36.7 C), temperature source Oral, resp. rate 16, SpO2 94 %. Physical Exam Vitals and nursing note reviewed.  Constitutional:      Appearance: She is normal weight.  Eyes:     General: No scleral icterus.    Extraocular Movements: Extraocular movements intact.     Conjunctiva/sclera: Conjunctivae normal.     Pupils: Pupils are equal, round, and reactive to light.  Cardiovascular:     Rate and Rhythm: Normal rate and regular rhythm.     Heart sounds: Normal heart sounds. No murmur heard.   Pulmonary:     Effort: Pulmonary effort is normal. No respiratory distress.     Breath sounds: Normal breath sounds.  Abdominal:     General: Abdomen is flat. Bowel sounds are normal. There is no distension.     Palpations: Abdomen is soft.  Musculoskeletal:        General: No tenderness.     Right lower leg: No edema.     Left lower leg: No edema.     Comments: No pain with upper limb or lower limb range of motion  Skin:    General: Skin is dry.  Neurological:     Mental Status: She is lethargic.     Cranial Nerves: Dysarthria present.     Motor: Weakness present.     Coordination: Coordination abnormal.     Gait: Gait abnormal.     Comments: Patient is alert in no acute distress.  Makes eye contact with examiner.  She was able to provide her name and age but was a limited medical historian.  Follows simple commands. Oriented to person and time but not place felt she was at [place] is 4/5 in the left deltoid bicep tricep grip hip flexor knee extensor ankle dorsiflexor 5/5 on the right  Decreased fine motor on the left  Tone is normal bilaterally  Psychiatric:        Attention and Perception: She is inattentive.        Mood and Affect: Affect is blunt.        Speech: Speech is delayed.        Behavior: Behavior is slowed.        Cognition and  Memory: Cognition is impaired.     No results found for this or any previous visit (from the past 24 hour(s)). No results found.   Assessment/Plan: Diagnosis: Right thalamic ICH 1. Does the need for close, 24 hr/day medical supervision in concert with the patient's rehab needs make it unreasonable for this patient to be served in a less intensive setting? Yes 2. Co-Morbidities requiring supervision/potential complications: Hypertension, coronary artery disease, history of right TKR 11/12/2020 3. Due to  bladder management, bowel management, safety, skin/wound care, disease management, medication administration, pain management and patient education, does the patient require 24 hr/day rehab nursing? Yes 4. Does the patient require coordinated care of a physician, rehab nurse, therapy disciplines of PT, OT, speech to address physical and functional deficits in the context of the above medical diagnosis(es)? Yes Addressing deficits in the following areas: balance, endurance, locomotion, strength, transferring, bowel/bladder control, bathing, dressing, toileting, cognition and psychosocial support 5. Can the patient actively participate in an intensive therapy program of at least 3 hrs of therapy per day at least 5 days per week? Yes 6. The potential for patient to make measurable gains while on inpatient rehab is good 7. Anticipated functional outcomes upon discharge from inpatient rehab are supervision  with PT, supervision with OT, supervision with SLP. 8. Estimated rehab length of stay to reach the above functional goals is: 12 to 16 days 9. Anticipated discharge destination: Home 10. Overall Rehab/Functional Prognosis: good  RECOMMENDATIONS: This patient's condition is appropriate for continued rehabilitative care in the following setting: CIR Patient has agreed to participate in recommended program. Yes Note that insurance prior authorization may be required for reimbursement for recommended  care.  Comment: Need to assess caregiver availability and capability  Cathlyn Parsons, PA-C 04/14/2021   "I have personally performed a face to face diagnostic evaluation of this patient.  Additionally, I have reviewed and concur with the physician assistant's documentation above." Charlett Blake M.D. [facility] Fellow Am Acad of Phys Med and Rehab Diplomate Am Board of Electrodiagnostic Med Fellow Am Board of Interventional Pain

## 2021-04-14 NOTE — Progress Notes (Signed)
Physical Therapy Treatment Patient Details Name: [name] MRN: [id number] DOB: 05/27/1956 Today's Date: 04/14/2021    History of Present Illness Pt is 65 yo female admitted with R thalamic ICH 2' HTN and small vessel disease admitted 04/10/21. PMH R BG infarct 08/2013 former smoker CAD w/p stennting migraines    PT Comments    The pt was seen for progression of OOB mobility and stability challenge this session. The pt demos good progress for tolerance of activity and was able to complete multiple short bouts of ambulation in the room. However, the pt remains limited by significant deficits in safety awareness and problem solving that with her impairments in balance, make her at significant risk for falls. The pt had multiple LOB to the L with both static stance and gait which required minA to modA to steady. The pt also had multiple episodes of scissoring her RLE over her LLE resulting in a LOB and need for assistance with gait. The pt continues to present with mobility deficits that make her a great candidate for CIR level therapies to facilitate return to prior level of mobility and independence.     Follow Up Recommendations  CIR     Equipment Recommendations  None recommended by PT    Recommendations for Other Services       Precautions / Restrictions Precautions Precautions: Fall Precaution Comments: SBP < 160 Restrictions Weight Bearing Restrictions: No    Mobility  Bed Mobility Overal bed mobility: Needs Assistance Bed Mobility: Supine to Sit     Supine to sit: Supervision     General bed mobility comments: supervision for safety    Transfers Overall transfer level: Needs assistance Equipment used: Rolling walker (2 wheeled);1 person hand held assist Transfers: Sit to/from Stand Sit to Stand: Min guard;Min assist Stand pivot transfers: Min assist       General transfer comment: minG to power up, but multiple LOB to the L requiring minA to steady and  maintain upright while pt corrects feet position  Ambulation/Gait Ambulation/Gait assistance: Min assist Gait Distance (Feet): 15 Feet (+ 15 ft + 20 ft) Assistive device: Standard walker;1 person hand held assist Gait Pattern/deviations: Step-through pattern;Scissoring;Shuffle;Drifts right/left;Narrow base of support Gait velocity: decreased Gait velocity interpretation: <1.31 ft/sec, indicative of household ambulator General Gait Details: pt with narrow BOS with frequent crossing of R over L resulting in LOB to the L needing min-modA. The pt had similar LOB with BUE support on RW or single UE support through [employer]. cues to maintain wider BOS wiht gait   Modified Rankin (Stroke Patients Only) Modified Rankin (Stroke Patients Only) Pre-Morbid Rankin Score: No symptoms Modified Rankin: Moderately severe disability     Balance Overall balance assessment: Needs assistance Sitting-balance support: No upper extremity supported;Feet supported Sitting balance-Leahy Scale: Fair Sitting balance - Comments: pt static and dynamic sitting without UE support, feet suppoerted Postural control: Left lateral lean Standing balance support: Single extremity supported;Bilateral upper extremity supported;During functional activity Standing balance-Leahy Scale: Poor Standing balance comment: required UE support and intermittent minA to correct balance                            Cognition Arousal/Alertness: Awake/alert;Lethargic Behavior During Therapy: Flat affect Overall Cognitive Status: No family/caregiver present to determine baseline cognitive functioning  General Comments: Pt with delayed responses, sleeping initially, but able to maintain arousal with repeated stimulaiton and cues. Pt frequently with LOB to L or crossing her legs in standing which results in LOB, able to make corrections and aknowledge LOB but needed max cues to identify and  correct      Exercises      General Comments General comments (skin integrity, edema, etc.): VSS this session      Pertinent Vitals/Pain Pain Assessment: Faces Faces Pain Scale: No hurt Pain Intervention(s): Monitored during session     PT Goals (current goals can now be found in the care plan section) Acute Rehab PT Goals Patient Stated Goal: get some sleep PT Goal Formulation: With patient Time For Goal Achievement: 04/25/21 Potential to Achieve Goals: Good Progress towards PT goals: Progressing toward goals    Frequency    Min 4X/week      PT Plan Current plan remains appropriate       AM-PAC PT "6 Clicks" Mobility   Outcome Measure  Help needed turning from your back to your side while in a flat bed without using bedrails?: A Little Help needed moving from lying on your back to sitting on the side of a flat bed without using bedrails?: A Little Help needed moving to and from a bed to a chair (including a wheelchair)?: A Little Help needed standing up from a chair using your arms (e.g., wheelchair or bedside chair)?: A Little Help needed to walk in hospital room?: A Lot Help needed climbing 3-5 steps with a railing? : A Lot 6 Click Score: 16    End of Session Equipment Utilized During Treatment: Gait belt Activity Tolerance: Patient tolerated treatment well Patient left: with chair alarm set;in chair;with call bell/phone within reach (posey chair lap belt alarm) Nurse Communication: Mobility status PT Visit Diagnosis: Unsteadiness on feet (R26.81)     Time: 4709-6283 PT Time Calculation (min) (ACUTE ONLY): 30 min  Charges:  $Gait Training: 8-22 mins $Neuromuscular Re-education: 8-22 mins                     Karma Ganja, PT, DPT   Acute Rehabilitation Department Pager #: [phone]   Otho Bellows 04/14/2021, 9:09 AM

## 2021-04-15 LAB — SARS CORONAVIRUS 2 (TAT 6-24 HRS): SARS Coronavirus 2: NEGATIVE

## 2021-04-15 NOTE — TOC Progression Note (Signed)
Transition of Care [facility]) - Progression Note    Patient Details  Name: [name] MRN: [id number] Date of Birth: Mar 17, 1956  [facility] CM/SW [place], [place] Phone Number: 04/15/2021, 12:39 PM  Clinical Narrative:    12:37pm-called [facility]-lVM- to return call, needs to start SNF auth 12:32pm- spoke with patient's sister Karna Christmas, she states patient needs shoirt term rehab at [facility]- informed of bed offer- family requested to contact [place] called [place]- waiting on response 11:40-called patient's sister,Terri, left voice message to return call 11:37am- visit with patient, updated only bed offer is w/[facility] at this time- patient remains agreeable to SNF or CIR.  CSW will continue to follow and assist with discharge planning.  Thurmond Butts, MSW, LCSW Clinical Social Worker   Expected Discharge Plan: [place] (vs SNF) Barriers to Discharge: Continued Medical Work up  Expected Discharge Plan and Services Expected Discharge Plan: [place] (vs SNF) In-house Referral: Clinical Social Work Discharge Planning Services: CM Consult                                           Social Determinants of Health (SDOH) Interventions    Readmission Risk Interventions No flowsheet data found.

## 2021-04-15 NOTE — Progress Notes (Signed)
Physical Therapy Treatment Patient Details Name: [name] MRN: [id number] DOB: 09-Dec-1955 Today's Date: 04/15/2021    History of Present Illness Pt is 65 yo female admitted with R thalamic ICH 2' HTN and small vessel disease admitted 04/10/21. PMH R BG infarct 08/2013 former smoker CAD w/p stennting migraines    PT Comments    Pt resting upon PT arrival to room, wakes easily but expresses she is fatigued. Pt demonstrating continued incoordination during gait LLE>RLE, with periods of L lateral LOB and near-scissoring requiring cues and at times physical assist to correct. Pt tolerating short bouts of gait today, limited by fatigue and urinary incontinence when ambulating to bathroom. PT to continue to follow acutely, recommendation updated to SNF per pt and family preference.    Follow Up Recommendations  SNF     Equipment Recommendations  None recommended by PT    Recommendations for Other Services       Precautions / Restrictions Precautions Precautions: Fall Precaution Comments: SBP < 160 Restrictions Weight Bearing Restrictions: No    Mobility  Bed Mobility Overal bed mobility: Needs Assistance Bed Mobility: Supine to Sit;Sit to Supine     Supine to sit: Supervision Sit to supine: Supervision   General bed mobility comments: supervision for safety    Transfers Overall transfer level: Needs assistance Equipment used: 1 person hand held assist Transfers: Sit to/from Stand Sit to Stand: Min assist         General transfer comment: Min assist for rise, steady. STS x4, from EOB x3 and BSC x1.  Ambulation/Gait Ambulation/Gait assistance: Min assist;Mod assist Gait Distance (Feet): 15 Feet (x2) Assistive device: 1 person hand held assist Gait Pattern/deviations: Step-through pattern;Scissoring;Shuffle;Drifts right/left;Narrow base of support;Decreased stride length;Ataxic Gait velocity: decr   General Gait Details: min assist to steady, correct LOB, cues for  widening BOS with LLE as pt tends towards narrow BOS. + L incoordination with poor motor grading and control   Stairs             Wheelchair Mobility    Modified Rankin (Stroke Patients Only) Modified Rankin (Stroke Patients Only) Pre-Morbid Rankin Score: No symptoms Modified Rankin: Moderately severe disability     Balance Overall balance assessment: Needs assistance Sitting-balance support: No upper extremity supported;Feet supported Sitting balance-Leahy Scale: Fair   Postural control: Left lateral lean Standing balance support: Single extremity supported;Bilateral upper extremity supported;During functional activity Standing balance-Leahy Scale: Poor Standing balance comment: reliant on external support, + LOB with little to no pt awareness or righting                            Cognition Arousal/Alertness: Awake/alert Behavior During Therapy: Flat affect Overall Cognitive Status: Impaired/Different from baseline Area of Impairment: Memory;Safety/judgement;Awareness;Problem solving;Orientation;Attention;Following commands                 Orientation Level: Disoriented to;Time Current Attention Level: Sustained Memory: Decreased short-term memory;Decreased recall of precautions Following Commands: Follows one step commands with increased time Safety/Judgement: Decreased awareness of safety;Decreased awareness of deficits Awareness: Emergent Problem Solving: Decreased initiation;Difficulty sequencing General Comments: OT reviewed with pt earlier in day that today is Tuesday, pt states today is Monday and when asked what month it is pt states "7th" as if rehearsing questions that have been asked of her. Pt with very flat affect, little to no awareness of correct standing balance.      Exercises Other Exercises Other Exercises: Anterior and lateral target  tapping with foot, in standing, x10 bilaterally - to address balance and incoordination     General Comments General comments (skin integrity, edema, etc.): + LUE incoordination when donning/doffing socks; grip slipped and pt's hand bumped into toilet paper dispenser. Pt does not acknowledge this and continues with the task, until PT prompts her.      Pertinent Vitals/Pain Pain Assessment: No/denies pain Pain Intervention(s): Limited activity within patient's tolerance;Monitored during session    Home Living                      Prior Function            PT Goals (current goals can now be found in the care plan section) Acute Rehab PT Goals Patient Stated Goal: get some sleep PT Goal Formulation: With patient Time For Goal Achievement: 04/25/21 Potential to Achieve Goals: Good Progress towards PT goals: Progressing toward goals    Frequency    Min 3X/week      PT Plan Current plan remains appropriate    Co-evaluation              AM-PAC PT "6 Clicks" Mobility   Outcome Measure  Help needed turning from your back to your side while in a flat bed without using bedrails?: A Little Help needed moving from lying on your back to sitting on the side of a flat bed without using bedrails?: A Little Help needed moving to and from a bed to a chair (including a wheelchair)?: A Little Help needed standing up from a chair using your arms (e.g., wheelchair or bedside chair)?: A Little Help needed to walk in hospital room?: A Lot Help needed climbing 3-5 steps with a railing? : A Lot 6 Click Score: 16    End of Session Equipment Utilized During Treatment: Gait belt Activity Tolerance: Patient tolerated treatment well Patient left: with call bell/phone within reach;in bed;with bed alarm set (posey chair lap belt alarm) Nurse Communication: Mobility status PT Visit Diagnosis: Unsteadiness on feet (R26.81)     Time: 7867-5449 PT Time Calculation (min) (ACUTE ONLY): 21 min  Charges:  $Gait Training: 8-22 mins                     Stacie Glaze, PT DPT Acute  Rehabilitation Services Pager [phone]  Office [phone]   Roxine Caddy E Ruffin Pyo 04/15/2021, 3:58 PM

## 2021-04-15 NOTE — Progress Notes (Signed)
Inpatient Rehab Admissions Coordinator:   Family has elected for short term rehab at [facility] and [facility] is working toward placement. CIR to sign off.  Clemens Catholic, [place], [place] Admissions Coordinator  [phone] ([place]) [phone] (office)

## 2021-04-15 NOTE — TOC Progression Note (Signed)
Transition of Care [facility]) - Progression Note    Patient Details  Name: [name] MRN: [id number] Date of Birth: 04/27/1956  [facility] Care [facility]) CM/SW [place], [place] Phone Number: 04/15/2021, 3:28 PM  Clinical Narrative:     CSW called and informed family [facility] has offered placement- they have accepted offer CSW contacted [facility]- informed SNF choice is [facility] -insurance pending Informed RN & requested covid test  Thurmond Butts, MSW, LCSW Clinical Social Worker   Expected Discharge Plan: [place] (vs SNF) Barriers to Discharge: Continued Medical Work up  Expected Discharge Plan and Services Expected Discharge Plan: [place] (vs SNF) In-house Referral: Clinical Social Work Discharge Planning Services: CM Consult                                           Social Determinants of Health (SDOH) Interventions    Readmission Risk Interventions No flowsheet data found.

## 2021-04-15 NOTE — Progress Notes (Signed)
[facility]                                                                              Patient Demographics  [name], is a 65 y.o. female, DOB - 1955/11/13, IOX:[id number]  Admit date - 04/10/2021   Admitting Physician Kerney Elbe, MD  Outpatient Primary MD for the patient is Baxter Hire, MD  Outpatient specialists:   LOS - 5  days   Medical records reviewed and are as summarized below:    No chief complaint on file.      Brief summary   Patient is a 65 year old female with history of CAD, bilateral carotid disease, CKD, prior history of CVA in 2014, depression, GERD, hypertension, hyperlipidemia presented as initial transfer from [facility] ICU admission with complaints of left-sided weakness and feeling off balance.  Patient reported that she was in her usual state of health and night before, took Ambien 10 mg, woke up on the morning around 4:30 AM with left-sided weakness, dizziness, difficulty with balance and falling to the left whenever she attempted to ambulate throughout the day.  No other focal neurological deficits. In ED, patient was noted to have elevated BP 184/102, heart rate 70.  Patient had mild left facial weakness, left arm and leg ataxia otherwise no obvious focal neurological deficits. Noncontrast CT showed acute right thalamic hemorrhage, 5 mm right-to-left midline shift. EDP had discussed with neurosurgery who recommended no surgical intervention. EDP discussed with neurology, Dr. Cheral Marker and patient was transferred to [facility]. Patient admitted under neurology service, underwent extensive work-up PT valuation recommended CIR.  TRH assumed care on 04/12/2021  Awaiting placement.  Initially worked up for [facility], family has now elected for short-term rehab at [facility].  TOC assisting with discharge.  Medically stable.   Assessment & Plan    Principal problem   ICH (intracerebral hemorrhage) ([place]) likely secondary to hypertensive emergency,  small vessel disease -Presented with resultant left arm weakness and mild left facial droop. -CT head showed small right thalamic ICH.  Repeat CT showed stable right thalamic ICH. 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 -MRA showed multifocal moderate stenosis of left PCA -2D echo showed EF of 55 to 60%, carotid Dopplers unremarkable -LDL 177, continue statin -Hemoglobin A1c 6.0 -Passed swallow screen, now on regular diet. -Neurology now signed off, no antithrombotics PTA, now on no antithrombotics due to [place].  Recommend to hold for 2 weeks and repeat CT head.  If stable and BP controlled, then start aspirin for secondary stroke prevention.  -Medically stable, no acute complaints, awaiting SNF  History of prior CVA -In 2014 and 2016 -Management as #1 -Continue statin  Hypertensive emergency, history of prior CAD -Presented with BP of 184/102 with small ICH -Goal BP less than 160 -Continue labetalol as needed with parameters -BP stable, continue triamterene HCTZ, metoprolol  GERD -Continue PPI  Anxiety -Continue paroxetine   Code Status: Full CODE STATUS DVT Prophylaxis:  SCD's Start: 04/10/21 2021   Level of Care: Level of care: Progressive Family Communication: Discussed all imaging  results, lab results, explained to the patient    Disposition Plan:     Status is: Inpatient  Remains inpatient appropriate because:Inpatient level of care appropriate due to severity of illness   Dispo: The patient is from: Home              Anticipated d/c is to: CIR              Patient currently is medically stable to d/c.  Awaiting placement at SNF   Difficult to place patient No      Time Spent in minutes 15 minutes   Procedures:  2D echo, MRI, MRA, carotid Dopplers  Consultants:   Admitted by neurology service on 6/2  Antimicrobials:   Anti-infectives (From  admission, onward)   None         Medications  Scheduled Meds: . amLODipine  5 mg Oral Daily  . FLUoxetine  40 mg Oral Daily  . metoprolol tartrate  25 mg Oral BID  . pantoprazole  40 mg Oral QHS  . rosuvastatin  20 mg Oral Daily  . senna-docusate  1 tablet Oral BID  . triamterene-hydrochlorothiazide  1 tablet Oral Daily   Continuous Infusions: PRN Meds:.acetaminophen **OR** acetaminophen (TYLENOL) oral liquid 160 mg/5 mL **OR** acetaminophen, labetalol      Subjective:   Bertha Lokken was seen and examined today.  No acute complaints.  Eating breakfast at the time of my examination.  Tolerating diet.  No acute events overnight.  No chest pain, shortness of breath, fevers or chills.  Awaiting SNF.  N  Objective:   Vitals:   04/14/21 2300 04/15/21 0320 04/15/21 0752 04/15/21 1114  BP: 129/82 110/76 138/88 (!) 153/79  Pulse: 68 62 68 89  Resp: 15 13 15 15   Temp: 98.4 F (36.9 C) 98.5 F (36.9 C) 98.5 F (36.9 C) (!) 97.5 F (36.4 C)  TempSrc: Oral Oral Oral Oral  SpO2: 92% 92% 95% 93%    Intake/Output Summary (Last 24 hours) at 04/15/2021 1407 Last data filed at 04/14/2021 2309 Gross per 24 hour  Intake 360 ml  Output 450 ml  Net -90 ml     Wt Readings from Last 3 Encounters:  04/10/21 66.3 kg  01/27/21 65.3 kg  11/12/20 65.3 kg    Physical Exam  General: Alert and oriented x 3, NAD  Cardiovascular: S1 S2 clear, RRR. No pedal edema b/l  Respiratory: CTA B  Gastrointestinal: Soft, nontender, nondistended, NBS  Ext: no pedal edema bilaterally  Neuro: mild left facial droop, strength 5/5 in upper and lower extremities, no new deficits  Psych: somewhat flat affect   Data Reviewed:  I have personally reviewed following labs and imaging studies  Micro Results Recent Results (from the past 240 hour(s))  Resp Panel by RT-PCR (Flu A&B, Covid) Nasopharyngeal Swab     Status: None   Collection Time: 04/09/21 11:54 PM   Specimen: Nasopharyngeal Swab;  Nasopharyngeal(NP) swabs in vial transport medium  Result Value Ref Range Status   SARS Coronavirus 2 by RT PCR NEGATIVE NEGATIVE Final    Comment: (NOTE) SARS-CoV-2 target nucleic acids are NOT DETECTED.  The SARS-CoV-2 RNA is generally detectable in upper respiratory specimens during the acute phase of infection. The lowest concentration of SARS-CoV-2 viral copies this assay can detect is 138 copies/mL. A negative result does not preclude SARS-Cov-2 infection and should not be used as the sole basis for treatment or other patient management decisions. A negative result may occur with  improper specimen collection/handling, submission of specimen other than nasopharyngeal swab, presence of viral mutation(s) within the areas targeted by this assay, and inadequate number of viral copies(<138 copies/mL). A negative result must be combined with clinical observations, patient history, and epidemiological information. The expected result is Negative.  Fact Sheet for Patients:  EntrepreneurPulse.com.au  Fact Sheet for Healthcare Providers:  IncredibleEmployment.be  This test is no t yet approved or cleared by the [place] FDA and  has been authorized for detection and/or diagnosis of SARS-CoV-2 by FDA under an Emergency Use Authorization (EUA). This EUA will remain  in effect (meaning this test can be used) for the duration of the COVID-19 declaration under Section 564(b)(1) of the Act, 21 U.S.C.section 360bbb-3(b)(1), unless the authorization is terminated  or revoked sooner.       Influenza A by PCR NEGATIVE NEGATIVE Final   Influenza B by PCR NEGATIVE NEGATIVE Final    Comment: (NOTE) The Xpert Xpress SARS-CoV-2/FLU/RSV plus assay is intended as an aid in the diagnosis of influenza from Nasopharyngeal swab specimens and should not be used as a sole basis for treatment. Nasal washings and aspirates are unacceptable for Xpert Xpress  SARS-CoV-2/FLU/RSV testing.  Fact Sheet for Patients: EntrepreneurPulse.com.au  Fact Sheet for Healthcare Providers: IncredibleEmployment.be  This test is not yet approved or cleared by the [place] FDA and has been authorized for detection and/or diagnosis of SARS-CoV-2 by FDA under an Emergency Use Authorization (EUA). This EUA will remain in effect (meaning this test can be used) for the duration of the COVID-19 declaration under Section 564(b)(1) of the Act, 21 U.S.C. section 360bbb-3(b)(1), unless the authorization is terminated or revoked.  Performed at [facility], [address] [place], [place] [address]   MRSA PCR Screening     Status: None   Collection Time: 04/10/21  3:27 AM   Specimen: Nasopharyngeal  Result Value Ref Range Status   MRSA by PCR NEGATIVE NEGATIVE Final    Comment:        The GeneXpert MRSA Assay (FDA approved for NASAL specimens only), is one component of a comprehensive MRSA colonization surveillance program. It is not intended to diagnose MRSA infection nor to guide or monitor treatment for MRSA infections. Performed at [facility], [address] [place], [place] [address]   MRSA PCR Screening     Status: None   Collection Time: 04/10/21 10:36 PM   Specimen: Nasopharyngeal  Result Value Ref Range Status   MRSA by PCR NEGATIVE NEGATIVE Final    Comment:        The GeneXpert MRSA Assay (FDA approved for NASAL specimens only), is one component of a comprehensive MRSA colonization surveillance program. It is not intended to diagnose MRSA infection nor to guide or monitor treatment for MRSA infections. Performed at [facility], [place] [address] [place], [place] [address]     Radiology Reports CT HEAD WO CONTRAST  Result Date: 04/10/2021 CLINICAL DATA:  Intracranial hemorrhage follow up EXAM: CT HEAD WITHOUT CONTRAST TECHNIQUE: Contiguous axial images were obtained  from the base of the skull through the vertex without intravenous contrast. COMPARISON:  04/10/2021 at 2:50 a.m. FINDINGS: Brain: Unchanged size of intraparenchymal hematoma centered in the right thalamus. No new site of hemorrhage. There is periventricular hypoattenuation compatible with chronic microvascular disease. Mild generalized volume loss. Unchanged size and configuration of the ventricles. Chronic small vessel infarct of the left basal ganglia. Vascular: No hyperdense vessel or unexpected calcification. Skull: Normal. Negative for  fracture or focal lesion. Sinuses/Orbits: No acute finding. Other: None IMPRESSION: Unchanged size of intraparenchymal hematoma centered in the right thalamus. Electronically Signed   By: Ulyses Jarred M.D.   On: 04/10/2021 23:15   CT HEAD WO CONTRAST  Result Date: 04/10/2021 CLINICAL DATA:  Intracranial hemorrhage follow-up EXAM: CT HEAD WITHOUT CONTRAST TECHNIQUE: Contiguous axial images were obtained from the base of the skull through the vertex without intravenous contrast. COMPARISON:  04/09/2021 FINDINGS: Brain: Unchanged size of intraparenchymal hemorrhage in the right thalamus. Trace leftward midline shift is unchanged. Old left caudate infarct. There is periventricular hypoattenuation compatible with chronic microvascular disease. Vascular: No abnormal hyperdensity of the major intracranial arteries or dural venous sinuses. No intracranial atherosclerosis. Skull: The visualized skull base, calvarium and extracranial soft tissues are normal. Sinuses/Orbits: No fluid levels or advanced mucosal thickening of the visualized paranasal sinuses. No mastoid or middle ear effusion. The orbits are normal. IMPRESSION: Unchanged size of right thalamic intraparenchymal hemorrhage with trace leftward midline shift. Electronically Signed   By: Ulyses Jarred M.D.   On: 04/10/2021 03:12   CT HEAD WO CONTRAST  Result Date: 04/09/2021 CLINICAL DATA:  TIA. EXAM: CT HEAD WITHOUT  CONTRAST TECHNIQUE: Contiguous axial images were obtained from the base of the skull through the vertex without intravenous contrast. COMPARISON:  05/18/2020 FINDINGS: Brain: There is hemorrhage within the right thalamus measuring 1.8 x 1.2 x 1.3 cm. Surrounding vasogenic edema. 5 mm of associated right to left midline shift. No hydrocephalus. Old left basal ganglia lacunar infarct. Chronic small vessel disease throughout the deep white matter. Vascular: No hyperdense vessel or unexpected calcification. Skull: No acute calvarial abnormality. Sinuses/Orbits: Visualized paranasal sinuses and mastoids clear. Orbital soft tissues unremarkable. Other: None IMPRESSION: Acute right thalamic hemorrhage. 5 mm of right to left midline shift. Old left basal ganglia lacunar infarct. Critical Value/emergent results were called by telephone at the time of interpretation on 04/09/2021 at 10:38 pm to provider [facility] , who verbally acknowledged these results. Electronically Signed   By: Rolm Baptise M.D.   On: 04/09/2021 22:40   MR MRA HEAD WO CONTRAST  Result Date: 04/10/2021 CLINICAL DATA:  Intraparenchymal hemorrhage of the right thalamus EXAM: MRA HEAD WITHOUT CONTRAST TECHNIQUE: Angiographic images of the Circle of Willis were acquired using MRA technique without intravenous contrast. COMPARISON:  No pertinent prior exam. FINDINGS: POSTERIOR CIRCULATION: --Vertebral arteries: Normal --Inferior cerebellar arteries: Poor visualization right PICA. Normal left PICA. --Basilar artery: Normal. --Superior cerebellar arteries: Normal. --Posterior cerebral arteries: Multifocal moderate stenosis of the left PCA. ANTERIOR CIRCULATION: --Intracranial internal carotid arteries: Normal. --Anterior cerebral arteries (ACA): Normal. --Middle cerebral arteries (MCA): Normal. ANATOMIC VARIANTS: None IMPRESSION: 1. No proximal large vessel occlusion or high-grade stenosis. 2. Multifocal moderate stenosis of the left PCA. Electronically Signed    By: Ulyses Jarred M.D.   On: 04/10/2021 23:36   MR BRAIN WO CONTRAST  Result Date: 04/10/2021 CLINICAL DATA:  Intracranial hemorrhage EXAM: MRI HEAD WITHOUT CONTRAST TECHNIQUE: Multiplanar, multiecho pulse sequences of the brain and surrounding structures were obtained without intravenous contrast. COMPARISON:  Correlation made with recent CT imaging.  MRI 2016 FINDINGS: Brain: Acute hemorrhage within the right thalamus is again identified with surrounding edema effacing the adjacent third ventricle. There is no intraventricular extension. No hydrocephalus. Chronic infarct of the left basal ganglia and adjacent white matter. Additional patchy and confluent areas of T2 hyperintensity in the supratentorial greater than pontine white matter are nonspecific but probably reflect moderate chronic microvascular ischemic changes. There  is a focus of susceptibility in the left frontal subcortical white matter; adjacent linear susceptibility could reflect a developmental venous anomaly and therefore this may represent a cavernous malformation. There is no intracranial mass, noting that there is limited evaluation for underlying right thalamic lesion without contrast. There is no hydrocephalus or extra-axial fluid collection. Vascular: Major vessel flow voids at the skull base are preserved. Skull and upper cervical spine: Normal marrow signal is preserved. Sinuses/Orbits: Paranasal sinuses are aerated. Orbits are unremarkable. Other: Sella is unremarkable.  Mastoid air cells are clear. IMPRESSION: Acute right thalamic hemorrhage with mild mass effect. No intraventricular extension. Moderate chronic microvascular ischemic changes. 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 Electronically Signed   By: Macy Mis M.D.   On: 04/10/2021 14:02   US Carotid Bilateral (at [facility] and [facility] only)  Result Date: 04/10/2021 CLINICAL DATA:  Hyperlipidemia,  stroke symptoms EXAM: BILATERAL CAROTID DUPLEX ULTRASOUND TECHNIQUE: Pearline Cables scale imaging, color Doppler and duplex ultrasound were performed of bilateral carotid and vertebral arteries in the neck. COMPARISON:  None. FINDINGS: Criteria: Quantification of carotid stenosis is based on velocity parameters that correlate the residual internal carotid diameter with NASCET-based stenosis levels, using the diameter of the distal internal carotid lumen as the denominator for stenosis measurement. The following velocity measurements were obtained: RIGHT ICA: 68/17 cm/sec CCA: 75/64 cm/sec SYSTOLIC ICA/CCA RATIO:  0.8 ECA: 90 cm/sec LEFT ICA: 64/10 cm/sec CCA: 33/29 cm/sec SYSTOLIC ICA/CCA RATIO:  0.8 ECA: 113 cm/sec RIGHT CAROTID ARTERY: Minor echogenic shadowing plaque formation. No hemodynamically significant right ICA stenosis, velocity elevation, or turbulent flow. Degree of narrowing less than 50%. RIGHT VERTEBRAL ARTERY:  Normal antegrade flow LEFT CAROTID ARTERY: Similar scattered minor echogenic plaque formation. No hemodynamically significant left ICA stenosis, velocity elevation, or turbulent flow. LEFT VERTEBRAL ARTERY:  Normal antegrade flow IMPRESSION: Bilateral carotid atherosclerosis. No hemodynamically significant ICA stenosis. Degree of narrowing less than 50% bilaterally by ultrasound criteria. Patent antegrade vertebral flow bilaterally Electronically Signed   By: Jerilynn Mages.  Shick M.D.   On: 04/10/2021 14:46   ECHOCARDIOGRAM COMPLETE  Result Date: 04/10/2021    ECHOCARDIOGRAM REPORT   Patient Name:   WYONIA FONTANELLA Date of Exam: 04/10/2021 Medical Rec #:  [id number]     Height:       62.0 in Accession #:    [id number]    Weight:       146.2 lb Date of Birth:  18-Sep-1956     BSA:          1.673 m Patient Age:    48 years      BP:           114/60 mmHg Patient Gender: F             HR:           73 bpm. Exam Location:  [facility] Procedure: 2D Echo, Color Doppler and Cardiac Doppler Indications:     I63.9 Stroke  History:          Patient has prior history of Echocardiogram examinations.                  Stroke; Risk Factors:Current Smoker, Hypertension, Dyslipidemia                  and HCL.  Sonographer:     Charmayne Sheer RDCS (AE) Referring Phys:  [id number] Bing Neighbors OUMA Diagnosing Phys: Kathlyn Sacramento MD  Sonographer Comments: Suboptimal  apical window. Global longitudinal strain was attempted. IMPRESSIONS  1. Left ventricular ejection fraction, by estimation, is 55 to 60%. The left ventricle has normal function. The left ventricle has no regional wall motion abnormalities. Left ventricular diastolic parameters are consistent with Grade I diastolic dysfunction (impaired relaxation). The global longitudinal strain is normal.  2. Right ventricular systolic function is normal. The right ventricular size is normal. Tricuspid regurgitation signal is inadequate for assessing PA pressure.  3. Trivial pericardial effusion is present. The pericardial effusion is circumferential.  4. The mitral valve is normal in structure. No evidence of mitral valve regurgitation. No evidence of mitral stenosis.  5. The aortic valve is normal in structure. Aortic valve regurgitation is mild. Mild aortic valve sclerosis is present, with no evidence of aortic valve stenosis.  6. The inferior vena cava is normal in size with greater than 50% respiratory variability, suggesting right atrial pressure of 3 mmHg. FINDINGS  Left Ventricle: Left ventricular ejection fraction, by estimation, is 55 to 60%. The left ventricle has normal function. The left ventricle has no regional wall motion abnormalities. The global longitudinal strain is normal. The left ventricular internal cavity size was normal in size. There is no left ventricular hypertrophy. Left ventricular diastolic parameters are consistent with Grade I diastolic dysfunction (impaired relaxation). Right Ventricle: The right ventricular size is normal. No increase in right ventricular wall thickness.  Right ventricular systolic function is normal. Tricuspid regurgitation signal is inadequate for assessing PA pressure. Left Atrium: Left atrial size was normal in size. Right Atrium: Right atrial size was normal in size. Pericardium: Trivial pericardial effusion is present. The pericardial effusion is circumferential. Mitral Valve: The mitral valve is normal in structure. No evidence of mitral valve regurgitation. No evidence of mitral valve stenosis. MV peak gradient, 3.4 mmHg. The mean mitral valve gradient is 1.0 mmHg. Tricuspid Valve: The tricuspid valve is normal in structure. Tricuspid valve regurgitation is not demonstrated. No evidence of tricuspid stenosis. Aortic Valve: The aortic valve is normal in structure. Aortic valve regurgitation is mild. Mild aortic valve sclerosis is present, with no evidence of aortic valve stenosis. Aortic valve mean gradient measures 5.0 mmHg. Aortic valve peak gradient measures 11.3 mmHg. Aortic valve area, by VTI measures 1.61 cm. Pulmonic Valve: The pulmonic valve was normal in structure. Pulmonic valve regurgitation is mild. No evidence of pulmonic stenosis. Aorta: The aortic root is normal in size and structure. Venous: The inferior vena cava is normal in size with greater than 50% respiratory variability, suggesting right atrial pressure of 3 mmHg. IAS/Shunts: No atrial level shunt detected by color flow Doppler.  LEFT VENTRICLE PLAX 2D LVIDd:         4.60 cm  Diastology LVIDs:         3.10 cm  LV e' medial:    6.42 cm/s LV PW:         1.20 cm  LV E/e' medial:  9.2 LV IVS:        0.90 cm  LV e' lateral:   6.20 cm/s LVOT diam:     1.90 cm  LV E/e' lateral: 9.5 LV SV:         47 LV SV Index:   28 LVOT Area:     2.84 cm  RIGHT VENTRICLE RV Basal diam:  2.50 cm LEFT ATRIUM           Index       RIGHT ATRIUM  Index LA diam:      3.40 cm 2.03 cm/m  RA Area:     10.30 cm LA Vol (A2C): 40.5 ml 24.21 ml/m RA Volume:   17.80 ml  10.64 ml/m LA Vol (A4C): 41.2 ml 24.62  ml/m  AORTIC VALVE                    PULMONIC VALVE AV Area (Vmax):    1.74 cm     PV Vmax:       1.26 m/s AV Area (Vmean):   1.75 cm     PV Vmean:      84.600 cm/s AV Area (VTI):     1.61 cm     PV VTI:        0.275 m AV Vmax:           168.00 cm/s  PV Peak grad:  6.4 mmHg AV Vmean:          101.000 cm/s PV Mean grad:  3.0 mmHg AV VTI:            0.292 m AV Peak Grad:      11.3 mmHg AV Mean Grad:      5.0 mmHg LVOT Vmax:         103.00 cm/s LVOT Vmean:        62.300 cm/s LVOT VTI:          0.166 m LVOT/AV VTI ratio: 0.57  AORTA Ao Root diam: 3.30 cm MITRAL VALVE MV Area (PHT): 3.60 cm    SHUNTS MV Area VTI:   1.89 cm    Systemic VTI:  0.17 m MV Peak grad:  3.4 mmHg    Systemic Diam: 1.90 cm MV Mean grad:  1.0 mmHg MV Vmax:       0.92 m/s MV Vmean:      52.7 cm/s MV Decel Time: 211 msec MV E velocity: 59.10 cm/s MV A velocity: 90.80 cm/s MV E/A ratio:  0.65 Kathlyn Sacramento MD Electronically signed by Kathlyn Sacramento MD Signature Date/Time: 04/10/2021/11:30:41 AM    Final     Lab Data:  CBC: Recent Labs  Lab 04/09/21 2218 04/10/21 0414 04/10/21 2109 04/13/21 0310  WBC 11.2* 13.6* 10.4 10.4  NEUTROABS 7.7  --   --   --   HGB 13.1 13.1 12.1 14.6  HCT 38.5 38.0 36.3 43.6  MCV 90.2 89.2 91.0 90.8  PLT 273 288 277 951   Basic Metabolic Panel: Recent Labs  Lab 04/09/21 2218 04/10/21 0414 04/10/21 2109 04/13/21 0310  NA 140 141 137 135  K 3.7 3.5 3.4* 3.8  CL 108 109 105 100  CO2 22 22 24 26   GLUCOSE 114* 127* 114* 112*  BUN 18 23 15 15   CREATININE 0.84 0.94 0.90 1.06*  CALCIUM 9.3 9.1 9.0 9.9  MG  --  1.9  --   --    GFR: Estimated Creatinine Clearance: 48.5 mL/min (A) (by C-G formula based on SCr of 1.06 mg/dL (H)). Liver Function Tests: Recent Labs  Lab 04/09/21 2218 04/10/21 2109  AST 24 26  ALT 18 19  ALKPHOS 91 82  BILITOT 0.7 1.2  PROT 7.7 6.5  ALBUMIN 3.9 3.4*   No results for input(s): LIPASE, AMYLASE in the last 168 hours. No results for input(s): AMMONIA in  the last 168 hours. Coagulation Profile: Recent Labs  Lab 04/09/21 2218  INR 1.0   Cardiac Enzymes: No results for input(s): CKTOTAL, CKMB, CKMBINDEX, TROPONINI in the  last 168 hours. BNP (last 3 results) No results for input(s): PROBNP in the last 8760 hours. HbA1C: No results for input(s): HGBA1C in the last 72 hours. CBG: Recent Labs  Lab 04/09/21 2251 04/10/21 0308 04/10/21 1520  GLUCAP 117* 142* 101*   Lipid Profile: No results for input(s): CHOL, HDL, LDLCALC, TRIG, CHOLHDL, LDLDIRECT in the last 72 hours. Thyroid Function Tests: No results for input(s): TSH, T4TOTAL, FREET4, T3FREE, THYROIDAB in the last 72 hours. Anemia Panel: No results for input(s): VITAMINB12, FOLATE, FERRITIN, TIBC, IRON, RETICCTPCT in the last 72 hours. Urine analysis:    Component Value Date/Time   COLORURINE YELLOW (A) 11/04/2020 1020   APPEARANCEUR HAZY (A) 11/04/2020 1020   LABSPEC 1.025 11/04/2020 1020   PHURINE 5.0 11/04/2020 1020   GLUCOSEU NEGATIVE 11/04/2020 1020   HGBUR NEGATIVE 11/04/2020 1020   BILIRUBINUR NEGATIVE 11/04/2020 1020   KETONESUR NEGATIVE 11/04/2020 1020   PROTEINUR NEGATIVE 11/04/2020 1020   UROBILINOGEN 0.2 02/18/2015 0410   NITRITE NEGATIVE 11/04/2020 1020   LEUKOCYTESUR NEGATIVE 11/04/2020 1020     Haseeb Fiallos M.D. [facility] 04/15/2021, 2:07 PM  Available via Epic secure chat 7am-7pm After 7 pm, please refer to night coverage provider listed on amion.

## 2021-04-15 NOTE — TOC Progression Note (Addendum)
Transition of Care [facility]) - Progression Note    Patient Details  Name: [name] MRN: [id number] Date of Birth: [birth year]  Transition of Care [facility]) CM/SW [place], [place] Phone Number: 04/15/2021, 1:16 PM  Clinical Narrative:     CSW contacted [facility] per family request- waiting on decision  CSW has started insurance auth w/[employer]  for SNF and PTAR-insurance will need to be informed of SNF once bed is secured  Thurmond Butts, MSW, LCSW Clinical Social Worker   Expected Discharge Plan: [place] (vs SNF) Barriers to Discharge: Continued Medical Work up  Expected Discharge Plan and Services Expected Discharge Plan: [place] (vs SNF) In-house Referral: Clinical Social Work Discharge Planning Services: CM Consult                                           Social Determinants of Health (SDOH) Interventions    Readmission Risk Interventions No flowsheet data found.

## 2021-04-15 NOTE — Progress Notes (Signed)
Occupational Therapy Treatment Patient Details Name: [name] MRN: [id number] DOB: 10-05-1956 Today's Date: 04/15/2021    History of present illness Pt is 65 yo female admitted with R thalamic ICH 2' HTN and small vessel disease admitted 04/10/21. 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 Recommend use of purewick at night time only if needed. Pt is able to transfer to restroom to void. Sister present with questions regarding d/c recommendations and transitional care/ RN notified. Recommendation changed to SNF based on conversation.   Follow Up Recommendations  SNF (family wants ALF)    Equipment Recommendations  3 in 1 bedside commode    Recommendations for Other Services      Precautions / Restrictions Precautions Precautions: Fall Precaution Comments: SBP <160       Mobility Bed Mobility Overal bed mobility: Modified Independent                  Transfers Overall transfer level: Needs assistance Equipment used: Rolling walker (2 wheeled) Transfers: Sit to/from Stand Sit to Stand: Max assist         General transfer comment: initially min (A) and progressed toward Max with LLE supination and crossing R LE    Balance                                           ADL either performed or assessed with clinical judgement   ADL Overall ADL's : Needs assistance/impaired Eating/Feeding: Minimal assistance;Sitting   Grooming: Wash/dry hands;Maximal assistance;Standing Grooming Details (indicate cue type and reason): LOB to the L with no self correction             Lower Body Dressing: Supervision/safety Lower Body Dressing Details (indicate cue type and reason): sitting on bed and adjsut socks then after adjusting falling back to the left Toilet Transfer: Moderate  assistance;Ambulation;Regular Toilet   Toileting- Clothing Manipulation and Hygiene: Minimal assistance;Sit to/from stand Toileting - Clothing Manipulation Details (indicate cue type and reason): able to static stand for peri care     Functional mobility during ADLs: Maximal assistance;Rolling walker (fatigued exiting bathroom) General ADL Comments: pt with progressive LLE fatigue with movement. Pt starting to drag LLE     Vision       Perception     Praxis      Cognition Arousal/Alertness: Awake/alert;Lethargic Behavior During Therapy: Flat affect Overall Cognitive Status: Impaired/Different from baseline Area of Impairment: Memory;Safety/judgement;Awareness;Problem solving                     Memory: Decreased short-term memory;Decreased recall of precautions   Safety/Judgement: Decreased awareness of safety;Decreased awareness of deficits Awareness: Emergent Problem Solving: Decreased initiation;Difficulty sequencing General Comments: pt reports today is Monday and with cues able to state Tuesday, reports month is June. pt is able to do math to determine her bday is in 7 days. pt incorrectly reports two visitors are sisters then states No thats niece and sister. pt able to name sister        Exercises     Shoulder Instructions       General Comments 123/112 RN notified    Pertinent Vitals/ Pain       Pain Assessment: No/denies pain  Home Living  Prior Functioning/Environment              Frequency  Min 2X/week        Progress Toward Goals  OT Goals(current goals can now be found in the care plan section)  Progress towards OT goals: Progressing toward goals  Acute Rehab OT Goals Patient Stated Goal: to eat lunch OT Goal Formulation: With patient Time For Goal Achievement: 04/25/21 Potential to Achieve Goals: Good ADL Goals Pt Will Perform Grooming: with set-up;sitting Pt Will  Transfer to Toilet: with min assist;regular height toilet;ambulating Additional ADL Goal #1: pt will complete bed mobility mod I as precursor to adls. Additional ADL Goal #2: pt will complete 5 step pathfinding task ( including 3 step command) mod I with written handout  Plan Discharge plan needs to be updated    Co-evaluation                 AM-PAC OT "6 Clicks" Daily Activity     Outcome Measure   Help from another person eating meals?: A Little Help from another person taking care of personal grooming?: A Little Help from another person toileting, which includes using toliet, bedpan, or urinal?: A Little Help from another person bathing (including washing, rinsing, drying)?: A Little Help from another person to put on and taking off regular upper body clothing?: A Little Help from another person to put on and taking off regular lower body clothing?: A Little 6 Click Score: 18    End of Session Equipment Utilized During Treatment: Gait belt;Rolling walker  OT Visit Diagnosis: Unsteadiness on feet (R26.81);Muscle weakness (generalized) (M62.81)   Activity Tolerance Patient tolerated treatment well   Patient Left in chair;with call bell/phone within reach;with chair alarm set;with family/visitor present   Nurse Communication Mobility status;Precautions        Time: 3845-3646 OT Time Calculation (min): 19 min  Charges: OT General Charges $OT Visit: 1 Visit OT Treatments $Self Care/Home Management : 8-22 mins   Brynn, OTR/L  Acute Rehabilitation Services Pager: [phone] Office: [phone] .    Jeri Modena 04/15/2021, 12:57 PM

## 2021-04-16 DIAGNOSIS — E785 Hyperlipidemia, unspecified: Secondary | ICD-10-CM | POA: Diagnosis not present

## 2021-04-16 DIAGNOSIS — Z7401 Bed confinement status: Secondary | ICD-10-CM | POA: Diagnosis not present

## 2021-04-16 DIAGNOSIS — I10 Essential (primary) hypertension: Secondary | ICD-10-CM | POA: Diagnosis not present

## 2021-04-16 DIAGNOSIS — R41841 Cognitive communication deficit: Secondary | ICD-10-CM | POA: Diagnosis not present

## 2021-04-16 DIAGNOSIS — Z8673 Personal history of transient ischemic attack (TIA), and cerebral infarction without residual deficits: Secondary | ICD-10-CM | POA: Diagnosis not present

## 2021-04-16 DIAGNOSIS — Z9181 History of falling: Secondary | ICD-10-CM | POA: Diagnosis not present

## 2021-04-16 DIAGNOSIS — I2510 Atherosclerotic heart disease of native coronary artery without angina pectoris: Secondary | ICD-10-CM | POA: Diagnosis not present

## 2021-04-16 DIAGNOSIS — I619 Nontraumatic intracerebral hemorrhage, unspecified: Secondary | ICD-10-CM | POA: Diagnosis not present

## 2021-04-16 DIAGNOSIS — F329 Major depressive disorder, single episode, unspecified: Secondary | ICD-10-CM | POA: Diagnosis not present

## 2021-04-16 DIAGNOSIS — R531 Weakness: Secondary | ICD-10-CM | POA: Diagnosis not present

## 2021-04-16 DIAGNOSIS — I639 Cerebral infarction, unspecified: Secondary | ICD-10-CM | POA: Diagnosis not present

## 2021-04-16 DIAGNOSIS — R2681 Unsteadiness on feet: Secondary | ICD-10-CM | POA: Diagnosis not present

## 2021-04-16 DIAGNOSIS — I25118 Atherosclerotic heart disease of native coronary artery with other forms of angina pectoris: Secondary | ICD-10-CM | POA: Diagnosis not present

## 2021-04-16 DIAGNOSIS — E876 Hypokalemia: Secondary | ICD-10-CM | POA: Diagnosis not present

## 2021-04-16 DIAGNOSIS — K219 Gastro-esophageal reflux disease without esophagitis: Secondary | ICD-10-CM | POA: Diagnosis not present

## 2021-04-16 DIAGNOSIS — F419 Anxiety disorder, unspecified: Secondary | ICD-10-CM | POA: Diagnosis not present

## 2021-04-16 DIAGNOSIS — I6900 Unspecified sequelae of nontraumatic subarachnoid hemorrhage: Secondary | ICD-10-CM | POA: Diagnosis not present

## 2021-04-16 DIAGNOSIS — I509 Heart failure, unspecified: Secondary | ICD-10-CM | POA: Diagnosis not present

## 2021-04-16 DIAGNOSIS — Z736 Limitation of activities due to disability: Secondary | ICD-10-CM | POA: Diagnosis not present

## 2021-04-16 DIAGNOSIS — G459 Transient cerebral ischemic attack, unspecified: Secondary | ICD-10-CM | POA: Diagnosis not present

## 2021-04-16 DIAGNOSIS — M6281 Muscle weakness (generalized): Secondary | ICD-10-CM | POA: Diagnosis not present

## 2021-04-16 DIAGNOSIS — M2550 Pain in unspecified joint: Secondary | ICD-10-CM | POA: Diagnosis not present

## 2021-04-16 DIAGNOSIS — I69122 Dysarthria following nontraumatic intracerebral hemorrhage: Secondary | ICD-10-CM | POA: Diagnosis not present

## 2021-04-16 DIAGNOSIS — I5022 Chronic systolic (congestive) heart failure: Secondary | ICD-10-CM | POA: Diagnosis not present

## 2021-04-16 LAB — BASIC METABOLIC PANEL
Anion gap: 13 (ref 5–15)
BUN: 29 mg/dL — ABNORMAL HIGH (ref 8–23)
CO2: 20 mmol/L — ABNORMAL LOW (ref 22–32)
Calcium: 9.8 mg/dL (ref 8.9–10.3)
Chloride: 100 mmol/L (ref 98–111)
Creatinine, Ser: 1.03 mg/dL — ABNORMAL HIGH (ref 0.44–1.00)
GFR, Estimated: >60 mL/min (ref >60)
Glucose, Bld: 92 mg/dL (ref 70–99)
Potassium: 3.4 mmol/L — ABNORMAL LOW (ref 3.5–5.1)
Sodium: 133 mmol/L — ABNORMAL LOW (ref 135–145)

## 2021-04-16 LAB — CBC
HCT: 44.2 % (ref 36.0–46.0)
Hemoglobin: 15 g/dL (ref 12.0–15.0)
MCH: 30.5 pg (ref 26.0–34.0)
MCHC: 33.9 g/dL (ref 30.0–36.0)
MCV: 89.8 fL (ref 80.0–100.0)
Platelets: 288 10*3/uL (ref 150–400)
RBC: 4.92 MIL/uL (ref 3.87–5.11)
RDW: 13 % (ref 11.5–15.5)
WBC: 9.1 10*3/uL (ref 4.0–10.5)
nRBC: 0 % (ref 0.0–0.2)

## 2021-04-16 MED ORDER — POTASSIUM CHLORIDE CRYS ER 20 MEQ PO TBCR
20.0000 meq | EXTENDED_RELEASE_TABLET | Freq: Once | ORAL | Status: AC
  Administered 2021-04-16: 20 meq via ORAL
  Filled 2021-04-16: qty 1

## 2021-04-16 NOTE — Discharge Summary (Signed)
Physician Discharge Summary  [name] IHK:[id number] DOB: 19-Sep-1956 DOA: 04/10/2021  PCP: Baxter Hire, MD  Admit date: 04/10/2021 Discharge date: 04/16/2021  Admitted From: Home Disposition: SNF   Recommendations for Outpatient Follow-up:  1. Follow up with PCP in 1-2 weeks 2. Repeat head CT in 2 weeks. If stable, start aspirin for secondary stroke prevention. 3. Follow up with neurology in 6-8 weeks.   Home Health: N/A Equipment/Devices: Per SNF Discharge Condition: Stable CODE STATUS: Full Diet recommendation: Heart healthy  Brief/Interim Summary: [name] is a 65 y.o. female with a history of CAD, bilateral carotid stenosis, CVA in 2014, HTN, HLD who initially presented to [facility] due to left-sided weakness and feeling off balance. She was hypertensive to 184/102 with mild left facial weakness, left arm and leg ataxia. CT head revealed an acute right thalamic hemorrhage with 5 mm right-to-left midline shift. Neurology recommended transfer to [facility] under their service. The patient has remained stable with need for ongoing therapy. The patient has opted for SNF rehabilitation which is being pursued. Further details regarding work up and management are below.  Discharge Diagnoses:  Principal Problem:   ICH (intracerebral hemorrhage) ([place]) Active Problems:   Essential hypertension   Coronary artery disease of native artery of native heart with stable angina pectoris (HCC)   Carotid arterial disease (HCC)   History of stroke   Chronic systolic CHF (congestive heart failure) (HCC)  Intracerebral hemorrhage secondary to hypertensive emergency, small vessel disease, history of CVA:  -Presented with resultant left arm weakness and mild left facial droop. Will continue therapy at SNF. -CT head showed small right thalamic ICH.  Repeat CT showed stable right thalamic ICH. 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 -MRA showed multifocal moderate stenosis of left PCA -2D echo showed EF of 55 to 60%, carotid dopplers unremarkable -LDL 177, continue statin -Hemoglobin A1c 6.0 -Per neurology: hold antithrombotics due to [place].  Recommend to hold antithrombotics for 2 weeks and repeat CT head. If stable and BP controlled, then start aspirin for secondary stroke prevention.    History CVA: In 2014 and 2016 - Management as #1  Hypertensive emergency: Improved. - Continue triamterene-HCTZ, norvasc, metoprolol, and use prn's to maintain SBP goal <143mmHg.   GERD - Continue PPI  Anxiety - Continue SSRI  History of chronic HFrEF, CAD: LVEF 55-60% this admission. Appears euvolemic. No anginal complaints.  Discharge Instructions  Allergies as of 04/16/2021      Reactions   Codeine Sulfate Other (See Comments)   sick   Penicillins Nausea And Vomiting   Has patient had a PCN reaction causing immediate rash, facial/tongue/throat swelling, SOB or lightheadedness with hypotension: No Has patient had a PCN reaction causing severe rash involving mucus membranes or skin necrosis: No Has patient had a PCN reaction that required hospitalization No Has patient had a PCN reaction occurring within the last 10 years: No If all of the above answers are "NO", then may proceed with Cephalosporin use.      Medication List    STOP taking these medications   apixaban 2.5 MG Tabs tablet Commonly known as: ELIQUIS   aspirin EC 81 MG tablet   buPROPion 150 MG 12 hr tablet Commonly known as: WELLBUTRIN SR   butalbital-acetaminophen-caffeine 50-325-40-30 MG capsule Commonly known as: FIORICET WITH CODEINE   ciprofloxacin 250 MG tablet Commonly known as: CIPRO   metoprolol tartrate 25  MG tablet Commonly known as: LOPRESSOR   zolpidem 10 MG tablet Commonly known as: AMBIEN     TAKE these medications   acetaminophen 500 MG tablet Commonly known as: TYLENOL Take 500  mg by mouth every 6 (six) hours as needed for moderate pain or mild pain.   amLODipine 10 MG tablet Commonly known as: NORVASC Take 1 tablet (10 mg total) by mouth daily.   FLUoxetine 40 MG capsule Commonly known as: PROZAC Take 40 mg by mouth daily.   furosemide 20 MG tablet Commonly known as: LASIX Take 20 mg by mouth as needed (fluid retention).   metoprolol succinate 50 MG 24 hr tablet Commonly known as: TOPROL-XL Take 50 mg by mouth daily. Take with or immediately following a meal.   nitroGLYCERIN 0.4 MG SL tablet Commonly known as: NITROSTAT Place 0.4 mg under the tongue every 5 (five) minutes as needed for chest pain.   ondansetron 4 MG tablet Commonly known as: ZOFRAN Take 4 mg by mouth every 8 (eight) hours as needed for nausea or vomiting.   pantoprazole 40 MG tablet Commonly known as: PROTONIX Take 1 tablet (40 mg total) by mouth at bedtime.   rosuvastatin 20 MG tablet Commonly known as: CRESTOR Take 20 mg by mouth daily.   triamterene-hydrochlorothiazide 37.5-25 MG capsule Commonly known as: DYAZIDE Take 1 capsule by mouth daily.       Follow-up Information    [facility]. Schedule an appointment as soon as possible for a visit in 4 day(s).   Specialty: Neurology Contact information: [address] [place] [phone]       Baxter Hire, MD Follow up.   Specialty: Internal Medicine Contact information: [place] [address] [phone]        Minna Merritts, MD .   Specialty: Cardiology Contact information: [address] [place] [address] [phone]              Allergies  Allergen Reactions  . Codeine Sulfate Other (See Comments)    sick  . Penicillins Nausea And Vomiting    Has patient had a PCN reaction causing immediate rash, facial/tongue/throat swelling, SOB or lightheadedness with hypotension: No Has patient had a  PCN reaction causing severe rash involving mucus membranes or skin necrosis: No Has patient had a PCN reaction that required hospitalization No Has patient had a PCN reaction occurring within the last 10 years: No If all of the above answers are "NO", then may proceed with Cephalosporin use.     Consultations:  Neurology  PM&R  Procedures/Studies: CT HEAD WO CONTRAST  Result Date: 04/10/2021 CLINICAL DATA:  Intracranial hemorrhage follow up EXAM: CT HEAD WITHOUT CONTRAST TECHNIQUE: Contiguous axial images were obtained from the base of the skull through the vertex without intravenous contrast. COMPARISON:  04/10/2021 at 2:50 a.m. FINDINGS: Brain: Unchanged size of intraparenchymal hematoma centered in the right thalamus. No new site of hemorrhage. There is periventricular hypoattenuation compatible with chronic microvascular disease. Mild generalized volume loss. Unchanged size and configuration of the ventricles. Chronic small vessel infarct of the left basal ganglia. Vascular: No hyperdense vessel or unexpected calcification. Skull: Normal. Negative for fracture or focal lesion. Sinuses/Orbits: No acute finding. Other: None IMPRESSION: Unchanged size of intraparenchymal hematoma centered in the right thalamus. Electronically Signed   By: Ulyses Jarred M.D.   On: 04/10/2021 23:15   CT HEAD WO CONTRAST  Result Date: 04/10/2021 CLINICAL DATA:  Intracranial hemorrhage follow-up EXAM: CT HEAD WITHOUT CONTRAST TECHNIQUE: Contiguous axial images were obtained from the base of the skull through the vertex without intravenous contrast. COMPARISON:  04/09/2021 FINDINGS: Brain: Unchanged size of intraparenchymal hemorrhage in the right thalamus. Trace leftward midline shift is unchanged. Old left caudate infarct. There is periventricular hypoattenuation compatible with chronic microvascular disease. Vascular: No abnormal hyperdensity of the major intracranial arteries or dural venous sinuses. No  intracranial atherosclerosis. Skull: The visualized skull base, calvarium and extracranial soft tissues are normal. Sinuses/Orbits: No fluid levels or advanced mucosal thickening of the visualized paranasal sinuses. No mastoid or middle ear effusion. The orbits are normal. IMPRESSION: Unchanged size of right thalamic intraparenchymal hemorrhage with trace leftward midline shift. Electronically Signed   By: Ulyses Jarred M.D.   On: 04/10/2021 03:12   CT HEAD WO CONTRAST  Result Date: 04/09/2021 CLINICAL DATA:  TIA. EXAM: CT HEAD WITHOUT CONTRAST TECHNIQUE: Contiguous axial images were obtained from the base of the skull through the vertex without intravenous contrast. COMPARISON:  05/18/2020 FINDINGS: Brain: There is hemorrhage within the right thalamus measuring 1.8 x 1.2 x 1.3 cm. Surrounding vasogenic edema. 5 mm of associated right to left midline shift. No hydrocephalus. Old left basal ganglia lacunar infarct. Chronic small vessel disease throughout the deep white matter. Vascular: No hyperdense vessel or unexpected calcification. Skull: No acute calvarial abnormality. Sinuses/Orbits: Visualized paranasal sinuses and mastoids clear. Orbital soft tissues unremarkable. Other: None IMPRESSION: Acute right thalamic hemorrhage. 5 mm of right to left midline shift. Old left basal ganglia lacunar infarct. Critical Value/emergent results were called by telephone at the time of interpretation on 04/09/2021 at 10:38 pm to provider [facility] , who verbally acknowledged these results. Electronically Signed   By: Rolm Baptise M.D.   On: 04/09/2021 22:40   MR MRA HEAD WO CONTRAST  Result Date: 04/10/2021 CLINICAL DATA:  Intraparenchymal hemorrhage of the right thalamus EXAM: MRA HEAD WITHOUT CONTRAST TECHNIQUE: Angiographic images of the Circle of Willis were acquired using MRA technique without intravenous contrast. COMPARISON:  No pertinent prior exam. FINDINGS: POSTERIOR CIRCULATION: --Vertebral arteries: Normal  --Inferior cerebellar arteries: Poor visualization right PICA. Normal left PICA. --Basilar artery: Normal. --Superior cerebellar arteries: Normal. --Posterior cerebral arteries: Multifocal moderate stenosis of the left PCA. ANTERIOR CIRCULATION: --Intracranial internal carotid arteries: Normal. --Anterior cerebral arteries (ACA): Normal. --Middle cerebral arteries (MCA): Normal. ANATOMIC VARIANTS: None IMPRESSION: 1. No proximal large vessel occlusion or high-grade stenosis. 2. Multifocal moderate stenosis of the left PCA. Electronically Signed   By: Ulyses Jarred M.D.   On: 04/10/2021 23:36   MR BRAIN WO CONTRAST  Result Date: 04/10/2021 CLINICAL DATA:  Intracranial hemorrhage EXAM: MRI HEAD WITHOUT CONTRAST TECHNIQUE: Multiplanar, multiecho pulse sequences of the brain and surrounding structures were obtained without intravenous contrast. COMPARISON:  Correlation made with recent CT imaging.  MRI 2016 FINDINGS: Brain: Acute hemorrhage within the right thalamus is again identified with surrounding edema effacing the adjacent third ventricle. There is no intraventricular extension. No hydrocephalus. Chronic infarct of the left basal ganglia and adjacent white matter. Additional patchy and confluent areas of T2 hyperintensity in the supratentorial greater than pontine white matter are nonspecific but probably reflect moderate chronic microvascular ischemic changes. There is a focus of susceptibility in the left frontal subcortical white matter; adjacent linear susceptibility could reflect a developmental venous anomaly and therefore this may represent a cavernous malformation. There is no intracranial mass, noting that there is limited evaluation for underlying right thalamic lesion without contrast. There  is no hydrocephalus or extra-axial fluid collection. Vascular: Major vessel flow voids at the skull base are preserved. Skull and upper cervical spine: Normal marrow signal is preserved. Sinuses/Orbits:  Paranasal sinuses are aerated. Orbits are unremarkable. Other: Sella is unremarkable.  Mastoid air cells are clear. IMPRESSION: Acute right thalamic hemorrhage with mild mass effect. No intraventricular extension. Moderate chronic microvascular ischemic changes. 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 Electronically Signed   By: Macy Mis M.D.   On: 04/10/2021 14:02   US Carotid Bilateral (at [facility] and [facility] only)  Result Date: 04/10/2021 CLINICAL DATA:  Hyperlipidemia, stroke symptoms EXAM: BILATERAL CAROTID DUPLEX ULTRASOUND TECHNIQUE: Pearline Cables scale imaging, color Doppler and duplex ultrasound were performed of bilateral carotid and vertebral arteries in the neck. COMPARISON:  None. FINDINGS: Criteria: Quantification of carotid stenosis is based on velocity parameters that correlate the residual internal carotid diameter with NASCET-based stenosis levels, using the diameter of the distal internal carotid lumen as the denominator for stenosis measurement. The following velocity measurements were obtained: RIGHT ICA: 68/17 cm/sec CCA: 45/36 cm/sec SYSTOLIC ICA/CCA RATIO:  0.8 ECA: 90 cm/sec LEFT ICA: 64/10 cm/sec CCA: 46/80 cm/sec SYSTOLIC ICA/CCA RATIO:  0.8 ECA: 113 cm/sec RIGHT CAROTID ARTERY: Minor echogenic shadowing plaque formation. No hemodynamically significant right ICA stenosis, velocity elevation, or turbulent flow. Degree of narrowing less than 50%. RIGHT VERTEBRAL ARTERY:  Normal antegrade flow LEFT CAROTID ARTERY: Similar scattered minor echogenic plaque formation. No hemodynamically significant left ICA stenosis, velocity elevation, or turbulent flow. LEFT VERTEBRAL ARTERY:  Normal antegrade flow IMPRESSION: Bilateral carotid atherosclerosis. No hemodynamically significant ICA stenosis. Degree of narrowing less than 50% bilaterally by ultrasound criteria. Patent antegrade vertebral flow bilaterally Electronically Signed    By: Jerilynn Mages.  Shick M.D.   On: 04/10/2021 14:46   ECHOCARDIOGRAM COMPLETE  Result Date: 04/10/2021    ECHOCARDIOGRAM REPORT   Patient Name:   [name] Date of Exam: 04/10/2021 Medical Rec #:  [id number]     Height:       62.0 in Accession #:    [id number]    Weight:       146.2 lb Date of Birth:  [birth year]     BSA:          1.673 m Patient Age:    65 years      BP:           114/60 mmHg Patient Gender: F             HR:           73 bpm. Exam Location:  [facility] Procedure: 2D Echo, Color Doppler and Cardiac Doppler Indications:     I63.9 Stroke  History:         Patient has prior history of Echocardiogram examinations.                  Stroke; Risk Factors:Current Smoker, Hypertension, Dyslipidemia                  and HCL.  Sonographer:     Charmayne Sheer RDCS (AE) Referring Phys:  [place] Diagnosing Phys: Kathlyn Sacramento MD  Sonographer Comments: Suboptimal apical window. Global longitudinal strain was attempted. IMPRESSIONS  1. Left ventricular ejection fraction, by estimation, is 55 to 60%. The left ventricle has normal function. The left ventricle has no regional wall motion abnormalities. Left ventricular diastolic parameters are consistent with Grade I diastolic dysfunction (impaired relaxation).  The global longitudinal strain is normal.  2. Right ventricular systolic function is normal. The right ventricular size is normal. Tricuspid regurgitation signal is inadequate for assessing PA pressure.  3. Trivial pericardial effusion is present. The pericardial effusion is circumferential.  4. The mitral valve is normal in structure. No evidence of mitral valve regurgitation. No evidence of mitral stenosis.  5. The aortic valve is normal in structure. Aortic valve regurgitation is mild. Mild aortic valve sclerosis is present, with no evidence of aortic valve stenosis.  6. The inferior vena cava is normal in size with greater than 50% respiratory variability, suggesting right atrial pressure of 3  mmHg. FINDINGS  Left Ventricle: Left ventricular ejection fraction, by estimation, is 55 to 60%. The left ventricle has normal function. The left ventricle has no regional wall motion abnormalities. The global longitudinal strain is normal. The left ventricular internal cavity size was normal in size. There is no left ventricular hypertrophy. Left ventricular diastolic parameters are consistent with Grade I diastolic dysfunction (impaired relaxation). Right Ventricle: The right ventricular size is normal. No increase in right ventricular wall thickness. Right ventricular systolic function is normal. Tricuspid regurgitation signal is inadequate for assessing PA pressure. Left Atrium: Left atrial size was normal in size. Right Atrium: Right atrial size was normal in size. Pericardium: Trivial pericardial effusion is present. The pericardial effusion is circumferential. Mitral Valve: The mitral valve is normal in structure. No evidence of mitral valve regurgitation. No evidence of mitral valve stenosis. MV peak gradient, 3.4 mmHg. The mean mitral valve gradient is 1.0 mmHg. Tricuspid Valve: The tricuspid valve is normal in structure. Tricuspid valve regurgitation is not demonstrated. No evidence of tricuspid stenosis. Aortic Valve: The aortic valve is normal in structure. Aortic valve regurgitation is mild. Mild aortic valve sclerosis is present, with no evidence of aortic valve stenosis. Aortic valve mean gradient measures 5.0 mmHg. Aortic valve peak gradient measures 11.3 mmHg. Aortic valve area, by VTI measures 1.61 cm. Pulmonic Valve: The pulmonic valve was normal in structure. Pulmonic valve regurgitation is mild. No evidence of pulmonic stenosis. Aorta: The aortic root is normal in size and structure. Venous: The inferior vena cava is normal in size with greater than 50% respiratory variability, suggesting right atrial pressure of 3 mmHg. IAS/Shunts: No atrial level shunt detected by color flow Doppler.  LEFT  VENTRICLE PLAX 2D LVIDd:         4.60 cm  Diastology LVIDs:         3.10 cm  LV e' medial:    6.42 cm/s LV PW:         1.20 cm  LV E/e' medial:  9.2 LV IVS:        0.90 cm  LV e' lateral:   6.20 cm/s LVOT diam:     1.90 cm  LV E/e' lateral: 9.5 LV SV:         47 LV SV Index:   28 LVOT Area:     2.84 cm  RIGHT VENTRICLE RV Basal diam:  2.50 cm LEFT ATRIUM           Index       RIGHT ATRIUM           Index LA diam:      3.40 cm 2.03 cm/m  RA Area:     10.30 cm LA Vol (A2C): 40.5 ml 24.21 ml/m RA Volume:   17.80 ml  10.64 ml/m LA Vol (A4C): 41.2 ml 24.62 ml/m  AORTIC VALVE  PULMONIC VALVE AV Area (Vmax):    1.74 cm     PV Vmax:       1.26 m/s AV Area (Vmean):   1.75 cm     PV Vmean:      84.600 cm/s AV Area (VTI):     1.61 cm     PV VTI:        0.275 m AV Vmax:           168.00 cm/s  PV Peak grad:  6.4 mmHg AV Vmean:          101.000 cm/s PV Mean grad:  3.0 mmHg AV VTI:            0.292 m AV Peak Grad:      11.3 mmHg AV Mean Grad:      5.0 mmHg LVOT Vmax:         103.00 cm/s LVOT Vmean:        62.300 cm/s LVOT VTI:          0.166 m LVOT/AV VTI ratio: 0.57  AORTA Ao Root diam: 3.30 cm MITRAL VALVE MV Area (PHT): 3.60 cm    SHUNTS MV Area VTI:   1.89 cm    Systemic VTI:  0.17 m MV Peak grad:  3.4 mmHg    Systemic Diam: 1.90 cm MV Mean grad:  1.0 mmHg MV Vmax:       0.92 m/s MV Vmean:      52.7 cm/s MV Decel Time: 211 msec MV E velocity: 59.10 cm/s MV A velocity: 90.80 cm/s MV E/A ratio:  0.65 Kathlyn Sacramento MD Electronically signed by Kathlyn Sacramento MD Signature Date/Time: 04/10/2021/11:30:41 AM    Final      Subjective: Feels sleepy but no new numbness, weakness. She remains with stable left leg weakness/difficulty moving primarily. No chest pain or dyspnea. SBP 149 then 133 this AM.  Discharge Exam: Vitals:   04/16/21 0325 04/16/21 0756  BP: (!) 149/77 133/70  Pulse: 60 (!) 56  Resp: 16 16  Temp: 97.7 F (36.5 C) 98.3 F (36.8 C)  SpO2: 92% 93%   General: Pt is alert, awake,  not in acute distress Cardiovascular: RRR, S1/S2 +, no rubs, no gallops Respiratory: CTA bilaterally, no wheezing, no rhonchi Abdominal: Soft, NT, ND, bowel sounds + Extremities: No edema, no cyanosis Neuro: Subtle LLE weakness, ataxia, left facial droop mildly.   Labs: BNP (last 3 results) No results for input(s): BNP in the last 8760 hours. Basic Metabolic Panel: Recent Labs  Lab 04/09/21 2218 04/10/21 0414 04/10/21 2109 04/13/21 0310 04/16/21 0507  NA 140 141 137 135 133*  K 3.7 3.5 3.4* 3.8 3.4*  CL 108 109 105 100 100  CO2 22 22 24 26  20*  GLUCOSE 114* 127* 114* 112* 92  BUN 18 23 15 15  29*  CREATININE 0.84 0.94 0.90 1.06* 1.03*  CALCIUM 9.3 9.1 9.0 9.9 9.8  MG  --  1.9  --   --   --    Liver Function Tests: Recent Labs  Lab 04/09/21 2218 04/10/21 2109  AST 24 26  ALT 18 19  ALKPHOS 91 82  BILITOT 0.7 1.2  PROT 7.7 6.5  ALBUMIN 3.9 3.4*   No results for input(s): LIPASE, AMYLASE in the last 168 hours. No results for input(s): AMMONIA in the last 168 hours. CBC: Recent Labs  Lab 04/09/21 2218 04/10/21 0414 04/10/21 2109 04/13/21 0310 04/16/21 0507  WBC 11.2* 13.6* 10.4 10.4 9.1  NEUTROABS 7.7  --   --   --   --  HGB 13.1 13.1 12.1 14.6 15.0  HCT 38.5 38.0 36.3 43.6 44.2  MCV 90.2 89.2 91.0 90.8 89.8  PLT 273 288 277 328 288   Cardiac Enzymes: No results for input(s): CKTOTAL, CKMB, CKMBINDEX, TROPONINI in the last 168 hours. BNP: Invalid input(s): POCBNP CBG: Recent Labs  Lab 04/09/21 2251 04/10/21 0308 04/10/21 1520  GLUCAP 117* 142* 101*   D-Dimer No results for input(s): DDIMER in the last 72 hours. Hgb A1c No results for input(s): HGBA1C in the last 72 hours. Lipid Profile No results for input(s): CHOL, HDL, LDLCALC, TRIG, CHOLHDL, LDLDIRECT in the last 72 hours. Thyroid function studies No results for input(s): TSH, T4TOTAL, T3FREE, THYROIDAB in the last 72 hours.  Invalid input(s): FREET3 Anemia work up No results for  input(s): VITAMINB12, FOLATE, FERRITIN, TIBC, IRON, RETICCTPCT in the last 72 hours. Urinalysis    Component Value Date/Time   COLORURINE YELLOW (A) 11/04/2020 1020   APPEARANCEUR HAZY (A) 11/04/2020 1020   LABSPEC 1.025 11/04/2020 1020   PHURINE 5.0 11/04/2020 1020   GLUCOSEU NEGATIVE 11/04/2020 1020   HGBUR NEGATIVE 11/04/2020 1020   BILIRUBINUR NEGATIVE 11/04/2020 1020   KETONESUR NEGATIVE 11/04/2020 1020   PROTEINUR NEGATIVE 11/04/2020 1020   UROBILINOGEN 0.2 02/18/2015 0410   NITRITE NEGATIVE 11/04/2020 1020   LEUKOCYTESUR NEGATIVE 11/04/2020 1020    Microbiology Recent Results (from the past 240 hour(s))  Resp Panel by RT-PCR (Flu A&B, Covid) Nasopharyngeal Swab     Status: None   Collection Time: 04/09/21 11:54 PM   Specimen: Nasopharyngeal Swab; Nasopharyngeal(NP) swabs in vial transport medium  Result Value Ref Range Status   SARS Coronavirus 2 by RT PCR NEGATIVE NEGATIVE Final    Comment: (NOTE) SARS-CoV-2 target nucleic acids are NOT DETECTED.  The SARS-CoV-2 RNA is generally detectable in upper respiratory specimens during the acute phase of infection. The lowest concentration of SARS-CoV-2 viral copies this assay can detect is 138 copies/mL. A negative result does not preclude SARS-Cov-2 infection and should not be used as the sole basis for treatment or other patient management decisions. A negative result may occur with  improper specimen collection/handling, submission of specimen other than nasopharyngeal swab, presence of viral mutation(s) within the areas targeted by this assay, and inadequate number of viral copies(<138 copies/mL). A negative result must be combined with clinical observations, patient history, and epidemiological information. The expected result is Negative.  Fact Sheet for Patients:  EntrepreneurPulse.com.au  Fact Sheet for Healthcare Providers:  IncredibleEmployment.be  This test is no t yet  approved or cleared by the [place] FDA and  has been authorized for detection and/or diagnosis of SARS-CoV-2 by FDA under an Emergency Use Authorization (EUA). This EUA will remain  in effect (meaning this test can be used) for the duration of the COVID-19 declaration under Section 564(b)(1) of the Act, 21 U.S.C.section 360bbb-3(b)(1), unless the authorization is terminated  or revoked sooner.       Influenza A by PCR NEGATIVE NEGATIVE Final   Influenza B by PCR NEGATIVE NEGATIVE Final    Comment: (NOTE) The Xpert Xpress SARS-CoV-2/FLU/RSV plus assay is intended as an aid in the diagnosis of influenza from Nasopharyngeal swab specimens and should not be used as a sole basis for treatment. Nasal washings and aspirates are unacceptable for Xpert Xpress SARS-CoV-2/FLU/RSV testing.  Fact Sheet for Patients: EntrepreneurPulse.com.au  Fact Sheet for Healthcare Providers: [employer]  This test is not yet approved or cleared by the [place] FDA and has been authorized for detection and/or  diagnosis of SARS-CoV-2 by FDA under an Emergency Use Authorization (EUA). This EUA will remain in effect (meaning this test can be used) for the duration of the COVID-19 declaration under Section 564(b)(1) of the Act, 21 U.S.C. section 360bbb-3(b)(1), unless the authorization is terminated or revoked.  Performed at [facility], [address] [place], [place] [address]   MRSA PCR Screening     Status: None   Collection Time: 04/10/21  3:27 AM   Specimen: Nasopharyngeal  Result Value Ref Range Status   MRSA by PCR NEGATIVE NEGATIVE Final    Comment:        The GeneXpert MRSA Assay (FDA approved for NASAL specimens only), is one component of a comprehensive MRSA colonization surveillance program. It is not intended to diagnose MRSA infection nor to guide or monitor treatment for MRSA infections. Performed at [facility], [address] [place], [place] [address]   MRSA PCR Screening     Status: None   Collection Time: 04/10/21 10:36 PM   Specimen: Nasopharyngeal  Result Value Ref Range Status   MRSA by PCR NEGATIVE NEGATIVE Final    Comment:        The GeneXpert MRSA Assay (FDA approved for NASAL specimens only), is one component of a comprehensive MRSA colonization surveillance program. It is not intended to diagnose MRSA infection nor to guide or monitor treatment for MRSA infections. Performed at [facility], [place] [address], [place], [place] [address]   SARS CORONAVIRUS 2 (TAT 6-24 HRS) Nasopharyngeal Nasopharyngeal Swab     Status: None   Collection Time: 04/15/21  3:48 PM   Specimen: Nasopharyngeal Swab  Result Value Ref Range Status   SARS Coronavirus 2 NEGATIVE NEGATIVE Final    Comment: (NOTE) SARS-CoV-2 target nucleic acids are NOT DETECTED.  The SARS-CoV-2 RNA is generally detectable in upper and lower respiratory specimens during the acute phase of infection. Negative results do not preclude SARS-CoV-2 infection, do not rule out co-infections with other pathogens, and should not be used as the sole basis for treatment or other patient management decisions. Negative results must be combined with clinical observations, patient history, and epidemiological information. The expected result is Negative.  Fact Sheet for Patients: SugarRoll.be  Fact Sheet for Healthcare Providers: https://www.woods-mathews.com/  This test is not yet approved or cleared by the [place] FDA and  has been authorized for detection and/or diagnosis of SARS-CoV-2 by FDA under an Emergency Use Authorization (EUA). This EUA will remain  in effect (meaning this test can be used) for the duration of the COVID-19 declaration under Se ction 564(b)(1) of the Act, 21 U.S.C. section 360bbb-3(b)(1), unless the authorization is terminated  or revoked sooner.  Performed at [facility], [place] [address], [place], [place] [address]     Time coordinating discharge: Approximately 40 minutes  Patrecia Pour, MD  [facility] 04/16/2021, 10:24 AM

## 2021-04-16 NOTE — TOC Transition Note (Signed)
Transition of Care [facility]) - CM/SW Discharge Note   Patient Details  Name: [name] MRN: [id number] Date of Birth: 1956/05/18  Transition of Care [facility]) CM/SW Contact:  Vinie Sill, LCSW Phone Number: 04/16/2021, 2:40 PM   Clinical Narrative:     Patient will Discharge to: [facility]  Discharge Date: 04/16/2021 Family Notified: Terri,sister Transport SJ:WTGR  Per MD patient is ready for discharge. RN, patient, and facility notified of discharge. Discharge Summary sent to facility. RN given number for [phone], Room 606-B. Ambulance transport requested for patient.   Clinical Social Worker signing off.  Thurmond Butts, MSW, LCSW Clinical Social Worker   Final next level of care: Skilled Nursing Facility Barriers to Discharge: Barriers Resolved   Patient Goals and CMS Choice        Discharge Placement PASRR number recieved: 04/14/21            Patient chooses bed at: [facility] [place] Patient to be transferred to facility by: [place] Name of family member notified: Karna Christmas, sister Patient and family notified of of transfer: 04/16/21  Discharge Plan and Services In-house Referral: Clinical Social Work Discharge Planning Services: CM Consult                                 Social Determinants of Health (SDOH) Interventions     Readmission Risk Interventions No flowsheet data found.

## 2021-04-16 NOTE — TOC Progression Note (Signed)
Transition of Care [facility]) - Progression Note    Patient Details  Name: ZILDA NO MRN: [id number] Date of Birth: 1956/08/02  Transition of Care [facility]) CM/SW [place], [place] Phone Number: 04/16/2021, 11:54 AM  Clinical Narrative:     Received insurance authorization with [place] [phone] and SNF [facility] [phone] for 7 days  Thurmond Butts, MSW, LCSW Clinical Social Worker   Expected Discharge Plan: [place] (vs SNF) Barriers to Discharge: Continued Medical Work up  Expected Discharge Plan and Services Expected Discharge Plan: [place] (vs SNF) In-house Referral: Clinical Social Work Discharge Planning Services: CM Consult     Expected Discharge Date: 04/16/21                                     Social Determinants of Health (SDOH) Interventions    Readmission Risk Interventions No flowsheet data found.

## 2021-04-16 NOTE — Progress Notes (Signed)
Patient discharge to facility via transport, IV site removed, intact, no distress noted.

## 2021-04-16 NOTE — Progress Notes (Signed)
RN called report to [facility] RN

## 2021-04-17 DIAGNOSIS — I10 Essential (primary) hypertension: Secondary | ICD-10-CM | POA: Diagnosis not present

## 2021-04-17 DIAGNOSIS — I619 Nontraumatic intracerebral hemorrhage, unspecified: Secondary | ICD-10-CM | POA: Diagnosis not present

## 2021-04-17 DIAGNOSIS — I509 Heart failure, unspecified: Secondary | ICD-10-CM | POA: Diagnosis not present

## 2021-04-17 DIAGNOSIS — I639 Cerebral infarction, unspecified: Secondary | ICD-10-CM | POA: Diagnosis not present

## 2021-04-17 DIAGNOSIS — K219 Gastro-esophageal reflux disease without esophagitis: Secondary | ICD-10-CM | POA: Diagnosis not present

## 2021-04-17 DIAGNOSIS — F419 Anxiety disorder, unspecified: Secondary | ICD-10-CM | POA: Diagnosis not present

## 2021-04-18 DIAGNOSIS — I639 Cerebral infarction, unspecified: Secondary | ICD-10-CM | POA: Diagnosis not present

## 2021-04-18 DIAGNOSIS — I619 Nontraumatic intracerebral hemorrhage, unspecified: Secondary | ICD-10-CM | POA: Diagnosis not present

## 2021-04-18 DIAGNOSIS — I509 Heart failure, unspecified: Secondary | ICD-10-CM | POA: Diagnosis not present

## 2021-04-18 DIAGNOSIS — I10 Essential (primary) hypertension: Secondary | ICD-10-CM | POA: Diagnosis not present

## 2021-04-18 DIAGNOSIS — K219 Gastro-esophageal reflux disease without esophagitis: Secondary | ICD-10-CM | POA: Diagnosis not present

## 2021-04-18 DIAGNOSIS — F419 Anxiety disorder, unspecified: Secondary | ICD-10-CM | POA: Diagnosis not present

## 2021-04-18 DIAGNOSIS — Z9181 History of falling: Secondary | ICD-10-CM | POA: Diagnosis not present

## 2021-04-21 DIAGNOSIS — Z9181 History of falling: Secondary | ICD-10-CM | POA: Diagnosis not present

## 2021-04-21 DIAGNOSIS — I619 Nontraumatic intracerebral hemorrhage, unspecified: Secondary | ICD-10-CM | POA: Diagnosis not present

## 2021-04-21 DIAGNOSIS — I639 Cerebral infarction, unspecified: Secondary | ICD-10-CM | POA: Diagnosis not present

## 2021-04-22 DIAGNOSIS — I10 Essential (primary) hypertension: Secondary | ICD-10-CM | POA: Diagnosis not present

## 2021-05-01 DIAGNOSIS — I639 Cerebral infarction, unspecified: Secondary | ICD-10-CM | POA: Diagnosis not present

## 2021-05-01 DIAGNOSIS — I509 Heart failure, unspecified: Secondary | ICD-10-CM | POA: Diagnosis not present

## 2021-05-01 DIAGNOSIS — Z9181 History of falling: Secondary | ICD-10-CM | POA: Diagnosis not present

## 2021-05-01 DIAGNOSIS — I619 Nontraumatic intracerebral hemorrhage, unspecified: Secondary | ICD-10-CM | POA: Diagnosis not present

## 2021-05-01 DIAGNOSIS — I10 Essential (primary) hypertension: Secondary | ICD-10-CM | POA: Diagnosis not present

## 2021-05-08 DIAGNOSIS — I509 Heart failure, unspecified: Secondary | ICD-10-CM | POA: Diagnosis not present

## 2021-05-08 DIAGNOSIS — I10 Essential (primary) hypertension: Secondary | ICD-10-CM | POA: Diagnosis not present

## 2021-05-08 DIAGNOSIS — I639 Cerebral infarction, unspecified: Secondary | ICD-10-CM | POA: Diagnosis not present

## 2021-05-08 DIAGNOSIS — Z9181 History of falling: Secondary | ICD-10-CM | POA: Diagnosis not present

## 2021-05-08 DIAGNOSIS — F419 Anxiety disorder, unspecified: Secondary | ICD-10-CM | POA: Diagnosis not present

## 2021-05-08 DIAGNOSIS — I619 Nontraumatic intracerebral hemorrhage, unspecified: Secondary | ICD-10-CM | POA: Diagnosis not present

## 2021-05-08 DIAGNOSIS — K219 Gastro-esophageal reflux disease without esophagitis: Secondary | ICD-10-CM | POA: Diagnosis not present

## 2021-05-15 DIAGNOSIS — S42292A Other displaced fracture of upper end of left humerus, initial encounter for closed fracture: Secondary | ICD-10-CM | POA: Diagnosis not present

## 2021-05-15 DIAGNOSIS — S4992XA Unspecified injury of left shoulder and upper arm, initial encounter: Secondary | ICD-10-CM | POA: Diagnosis not present

## 2021-05-15 DIAGNOSIS — S42255A Nondisplaced fracture of greater tuberosity of left humerus, initial encounter for closed fracture: Secondary | ICD-10-CM | POA: Diagnosis not present

## 2021-05-20 DIAGNOSIS — I110 Hypertensive heart disease with heart failure: Secondary | ICD-10-CM | POA: Diagnosis not present

## 2021-05-20 DIAGNOSIS — M1712 Unilateral primary osteoarthritis, left knee: Secondary | ICD-10-CM | POA: Diagnosis not present

## 2021-05-20 DIAGNOSIS — I69222 Dysarthria following other nontraumatic intracranial hemorrhage: Secondary | ICD-10-CM | POA: Diagnosis not present

## 2021-05-20 DIAGNOSIS — Z87440 Personal history of urinary (tract) infections: Secondary | ICD-10-CM | POA: Diagnosis not present

## 2021-05-20 DIAGNOSIS — I2510 Atherosclerotic heart disease of native coronary artery without angina pectoris: Secondary | ICD-10-CM | POA: Diagnosis not present

## 2021-05-20 DIAGNOSIS — Z8673 Personal history of transient ischemic attack (TIA), and cerebral infarction without residual deficits: Secondary | ICD-10-CM | POA: Diagnosis not present

## 2021-05-20 DIAGNOSIS — K219 Gastro-esophageal reflux disease without esophagitis: Secondary | ICD-10-CM | POA: Diagnosis not present

## 2021-05-20 DIAGNOSIS — F339 Major depressive disorder, recurrent, unspecified: Secondary | ICD-10-CM | POA: Diagnosis not present

## 2021-05-20 DIAGNOSIS — E559 Vitamin D deficiency, unspecified: Secondary | ICD-10-CM | POA: Diagnosis not present

## 2021-05-20 DIAGNOSIS — S42255A Nondisplaced fracture of greater tuberosity of left humerus, initial encounter for closed fracture: Secondary | ICD-10-CM | POA: Diagnosis not present

## 2021-05-20 DIAGNOSIS — I5022 Chronic systolic (congestive) heart failure: Secondary | ICD-10-CM | POA: Diagnosis not present

## 2021-05-20 DIAGNOSIS — F411 Generalized anxiety disorder: Secondary | ICD-10-CM | POA: Diagnosis not present

## 2021-05-20 DIAGNOSIS — S42202D Unspecified fracture of upper end of left humerus, subsequent encounter for fracture with routine healing: Secondary | ICD-10-CM | POA: Diagnosis not present

## 2021-05-20 DIAGNOSIS — E785 Hyperlipidemia, unspecified: Secondary | ICD-10-CM | POA: Diagnosis not present

## 2021-05-20 DIAGNOSIS — G40909 Epilepsy, unspecified, not intractable, without status epilepticus: Secondary | ICD-10-CM | POA: Diagnosis not present

## 2021-05-20 DIAGNOSIS — I69254 Hemiplegia and hemiparesis following other nontraumatic intracranial hemorrhage affecting left non-dominant side: Secondary | ICD-10-CM | POA: Diagnosis not present

## 2021-05-20 DIAGNOSIS — H409 Unspecified glaucoma: Secondary | ICD-10-CM | POA: Diagnosis not present

## 2021-05-20 DIAGNOSIS — F5101 Primary insomnia: Secondary | ICD-10-CM | POA: Diagnosis not present

## 2021-05-20 DIAGNOSIS — Z9181 History of falling: Secondary | ICD-10-CM | POA: Diagnosis not present

## 2021-05-22 DIAGNOSIS — I10 Essential (primary) hypertension: Secondary | ICD-10-CM | POA: Diagnosis not present

## 2021-05-22 DIAGNOSIS — I629 Nontraumatic intracranial hemorrhage, unspecified: Secondary | ICD-10-CM | POA: Diagnosis not present

## 2021-05-22 DIAGNOSIS — I2510 Atherosclerotic heart disease of native coronary artery without angina pectoris: Secondary | ICD-10-CM | POA: Diagnosis not present

## 2021-05-22 DIAGNOSIS — S42202D Unspecified fracture of upper end of left humerus, subsequent encounter for fracture with routine healing: Secondary | ICD-10-CM | POA: Diagnosis not present

## 2021-05-22 DIAGNOSIS — Z09 Encounter for follow-up examination after completed treatment for conditions other than malignant neoplasm: Secondary | ICD-10-CM | POA: Diagnosis not present

## 2021-05-22 DIAGNOSIS — Z720 Tobacco use: Secondary | ICD-10-CM | POA: Diagnosis not present

## 2021-05-26 DIAGNOSIS — Z87440 Personal history of urinary (tract) infections: Secondary | ICD-10-CM | POA: Diagnosis not present

## 2021-05-26 DIAGNOSIS — I69254 Hemiplegia and hemiparesis following other nontraumatic intracranial hemorrhage affecting left non-dominant side: Secondary | ICD-10-CM | POA: Diagnosis not present

## 2021-05-26 DIAGNOSIS — F339 Major depressive disorder, recurrent, unspecified: Secondary | ICD-10-CM | POA: Diagnosis not present

## 2021-05-26 DIAGNOSIS — G40909 Epilepsy, unspecified, not intractable, without status epilepticus: Secondary | ICD-10-CM | POA: Diagnosis not present

## 2021-05-26 DIAGNOSIS — Z9181 History of falling: Secondary | ICD-10-CM | POA: Diagnosis not present

## 2021-05-26 DIAGNOSIS — S42202D Unspecified fracture of upper end of left humerus, subsequent encounter for fracture with routine healing: Secondary | ICD-10-CM | POA: Diagnosis not present

## 2021-05-26 DIAGNOSIS — I5022 Chronic systolic (congestive) heart failure: Secondary | ICD-10-CM | POA: Diagnosis not present

## 2021-05-26 DIAGNOSIS — I2510 Atherosclerotic heart disease of native coronary artery without angina pectoris: Secondary | ICD-10-CM | POA: Diagnosis not present

## 2021-05-26 DIAGNOSIS — E785 Hyperlipidemia, unspecified: Secondary | ICD-10-CM | POA: Diagnosis not present

## 2021-05-26 DIAGNOSIS — E559 Vitamin D deficiency, unspecified: Secondary | ICD-10-CM | POA: Diagnosis not present

## 2021-05-26 DIAGNOSIS — F411 Generalized anxiety disorder: Secondary | ICD-10-CM | POA: Diagnosis not present

## 2021-05-26 DIAGNOSIS — I110 Hypertensive heart disease with heart failure: Secondary | ICD-10-CM | POA: Diagnosis not present

## 2021-05-26 DIAGNOSIS — K219 Gastro-esophageal reflux disease without esophagitis: Secondary | ICD-10-CM | POA: Diagnosis not present

## 2021-05-26 DIAGNOSIS — F5101 Primary insomnia: Secondary | ICD-10-CM | POA: Diagnosis not present

## 2021-05-26 DIAGNOSIS — I69222 Dysarthria following other nontraumatic intracranial hemorrhage: Secondary | ICD-10-CM | POA: Diagnosis not present

## 2021-05-26 DIAGNOSIS — M1712 Unilateral primary osteoarthritis, left knee: Secondary | ICD-10-CM | POA: Diagnosis not present

## 2021-05-26 DIAGNOSIS — H409 Unspecified glaucoma: Secondary | ICD-10-CM | POA: Diagnosis not present

## 2021-06-09 DIAGNOSIS — I2510 Atherosclerotic heart disease of native coronary artery without angina pectoris: Secondary | ICD-10-CM | POA: Diagnosis not present

## 2021-06-09 DIAGNOSIS — I69222 Dysarthria following other nontraumatic intracranial hemorrhage: Secondary | ICD-10-CM | POA: Diagnosis not present

## 2021-06-09 DIAGNOSIS — F339 Major depressive disorder, recurrent, unspecified: Secondary | ICD-10-CM | POA: Diagnosis not present

## 2021-06-09 DIAGNOSIS — G40909 Epilepsy, unspecified, not intractable, without status epilepticus: Secondary | ICD-10-CM | POA: Diagnosis not present

## 2021-06-09 DIAGNOSIS — H409 Unspecified glaucoma: Secondary | ICD-10-CM | POA: Diagnosis not present

## 2021-06-09 DIAGNOSIS — Z87440 Personal history of urinary (tract) infections: Secondary | ICD-10-CM | POA: Diagnosis not present

## 2021-06-09 DIAGNOSIS — F5101 Primary insomnia: Secondary | ICD-10-CM | POA: Diagnosis not present

## 2021-06-09 DIAGNOSIS — I69254 Hemiplegia and hemiparesis following other nontraumatic intracranial hemorrhage affecting left non-dominant side: Secondary | ICD-10-CM | POA: Diagnosis not present

## 2021-06-09 DIAGNOSIS — E785 Hyperlipidemia, unspecified: Secondary | ICD-10-CM | POA: Diagnosis not present

## 2021-06-09 DIAGNOSIS — S42202D Unspecified fracture of upper end of left humerus, subsequent encounter for fracture with routine healing: Secondary | ICD-10-CM | POA: Diagnosis not present

## 2021-06-09 DIAGNOSIS — I110 Hypertensive heart disease with heart failure: Secondary | ICD-10-CM | POA: Diagnosis not present

## 2021-06-09 DIAGNOSIS — K219 Gastro-esophageal reflux disease without esophagitis: Secondary | ICD-10-CM | POA: Diagnosis not present

## 2021-06-09 DIAGNOSIS — E559 Vitamin D deficiency, unspecified: Secondary | ICD-10-CM | POA: Diagnosis not present

## 2021-06-09 DIAGNOSIS — Z9181 History of falling: Secondary | ICD-10-CM | POA: Diagnosis not present

## 2021-06-09 DIAGNOSIS — I5022 Chronic systolic (congestive) heart failure: Secondary | ICD-10-CM | POA: Diagnosis not present

## 2021-06-09 DIAGNOSIS — M1712 Unilateral primary osteoarthritis, left knee: Secondary | ICD-10-CM | POA: Diagnosis not present

## 2021-06-09 DIAGNOSIS — F411 Generalized anxiety disorder: Secondary | ICD-10-CM | POA: Diagnosis not present

## 2021-06-13 DIAGNOSIS — S42255D Nondisplaced fracture of greater tuberosity of left humerus, subsequent encounter for fracture with routine healing: Secondary | ICD-10-CM | POA: Diagnosis not present

## 2021-06-17 DIAGNOSIS — I5022 Chronic systolic (congestive) heart failure: Secondary | ICD-10-CM | POA: Diagnosis not present

## 2021-06-17 DIAGNOSIS — S42202D Unspecified fracture of upper end of left humerus, subsequent encounter for fracture with routine healing: Secondary | ICD-10-CM | POA: Diagnosis not present

## 2021-06-17 DIAGNOSIS — I110 Hypertensive heart disease with heart failure: Secondary | ICD-10-CM | POA: Diagnosis not present

## 2021-06-17 DIAGNOSIS — I69254 Hemiplegia and hemiparesis following other nontraumatic intracranial hemorrhage affecting left non-dominant side: Secondary | ICD-10-CM | POA: Diagnosis not present

## 2021-06-17 DIAGNOSIS — I69222 Dysarthria following other nontraumatic intracranial hemorrhage: Secondary | ICD-10-CM | POA: Diagnosis not present

## 2021-06-17 DIAGNOSIS — M1712 Unilateral primary osteoarthritis, left knee: Secondary | ICD-10-CM | POA: Diagnosis not present

## 2021-06-17 DIAGNOSIS — I2510 Atherosclerotic heart disease of native coronary artery without angina pectoris: Secondary | ICD-10-CM | POA: Diagnosis not present

## 2021-06-19 DIAGNOSIS — M1712 Unilateral primary osteoarthritis, left knee: Secondary | ICD-10-CM | POA: Diagnosis not present

## 2021-06-19 DIAGNOSIS — I110 Hypertensive heart disease with heart failure: Secondary | ICD-10-CM | POA: Diagnosis not present

## 2021-06-19 DIAGNOSIS — I5022 Chronic systolic (congestive) heart failure: Secondary | ICD-10-CM | POA: Diagnosis not present

## 2021-06-19 DIAGNOSIS — I69254 Hemiplegia and hemiparesis following other nontraumatic intracranial hemorrhage affecting left non-dominant side: Secondary | ICD-10-CM | POA: Diagnosis not present

## 2021-06-19 DIAGNOSIS — I2510 Atherosclerotic heart disease of native coronary artery without angina pectoris: Secondary | ICD-10-CM | POA: Diagnosis not present

## 2021-06-19 DIAGNOSIS — S42202D Unspecified fracture of upper end of left humerus, subsequent encounter for fracture with routine healing: Secondary | ICD-10-CM | POA: Diagnosis not present

## 2021-06-19 DIAGNOSIS — I69222 Dysarthria following other nontraumatic intracranial hemorrhage: Secondary | ICD-10-CM | POA: Diagnosis not present

## 2021-06-30 ENCOUNTER — Emergency Department: Payer: PPO

## 2021-06-30 ENCOUNTER — Inpatient Hospital Stay
Admission: EM | Admit: 2021-06-30 | Discharge: 2021-07-02 | DRG: 064 | Disposition: A | Discharge status: Home Health | Payer: PPO | Attending: Internal Medicine | Admitting: Internal Medicine

## 2021-06-30 ENCOUNTER — Encounter: Payer: Self-pay | Admitting: Emergency Medicine

## 2021-06-30 ENCOUNTER — Other Ambulatory Visit: Payer: Self-pay

## 2021-06-30 DIAGNOSIS — K219 Gastro-esophageal reflux disease without esophagitis: Secondary | ICD-10-CM | POA: Diagnosis present

## 2021-06-30 DIAGNOSIS — E876 Hypokalemia: Secondary | ICD-10-CM | POA: Diagnosis not present

## 2021-06-30 DIAGNOSIS — I6622 Occlusion and stenosis of left posterior cerebral artery: Secondary | ICD-10-CM | POA: Diagnosis not present

## 2021-06-30 DIAGNOSIS — I129 Hypertensive chronic kidney disease with stage 1 through stage 4 chronic kidney disease, or unspecified chronic kidney disease: Secondary | ICD-10-CM | POA: Diagnosis present

## 2021-06-30 DIAGNOSIS — I6300 Cerebral infarction due to thrombosis of unspecified precerebral artery: Principal | ICD-10-CM

## 2021-06-30 DIAGNOSIS — Z79899 Other long term (current) drug therapy: Secondary | ICD-10-CM

## 2021-06-30 DIAGNOSIS — I6501 Occlusion and stenosis of right vertebral artery: Secondary | ICD-10-CM | POA: Diagnosis not present

## 2021-06-30 DIAGNOSIS — M79602 Pain in left arm: Secondary | ICD-10-CM

## 2021-06-30 DIAGNOSIS — Z803 Family history of malignant neoplasm of breast: Secondary | ICD-10-CM | POA: Diagnosis not present

## 2021-06-30 DIAGNOSIS — I6389 Other cerebral infarction: Secondary | ICD-10-CM | POA: Diagnosis not present

## 2021-06-30 DIAGNOSIS — Z20822 Contact with and (suspected) exposure to covid-19: Secondary | ICD-10-CM | POA: Diagnosis present

## 2021-06-30 DIAGNOSIS — F32A Depression, unspecified: Secondary | ICD-10-CM | POA: Diagnosis not present

## 2021-06-30 DIAGNOSIS — G319 Degenerative disease of nervous system, unspecified: Secondary | ICD-10-CM | POA: Diagnosis not present

## 2021-06-30 DIAGNOSIS — G43909 Migraine, unspecified, not intractable, without status migrainosus: Secondary | ICD-10-CM | POA: Diagnosis not present

## 2021-06-30 DIAGNOSIS — Z882 Allergy status to sulfonamides status: Secondary | ICD-10-CM

## 2021-06-30 DIAGNOSIS — Z823 Family history of stroke: Secondary | ICD-10-CM | POA: Diagnosis not present

## 2021-06-30 DIAGNOSIS — M79622 Pain in left upper arm: Secondary | ICD-10-CM | POA: Diagnosis not present

## 2021-06-30 DIAGNOSIS — M7532 Calcific tendinitis of left shoulder: Secondary | ICD-10-CM | POA: Diagnosis not present

## 2021-06-30 DIAGNOSIS — R55 Syncope and collapse: Secondary | ICD-10-CM | POA: Diagnosis not present

## 2021-06-30 DIAGNOSIS — Z8673 Personal history of transient ischemic attack (TIA), and cerebral infarction without residual deficits: Secondary | ICD-10-CM | POA: Diagnosis not present

## 2021-06-30 DIAGNOSIS — Z885 Allergy status to narcotic agent status: Secondary | ICD-10-CM | POA: Diagnosis not present

## 2021-06-30 DIAGNOSIS — I739 Peripheral vascular disease, unspecified: Secondary | ICD-10-CM | POA: Diagnosis present

## 2021-06-30 DIAGNOSIS — Z8616 Personal history of COVID-19: Secondary | ICD-10-CM | POA: Diagnosis not present

## 2021-06-30 DIAGNOSIS — I6381 Other cerebral infarction due to occlusion or stenosis of small artery: Secondary | ICD-10-CM | POA: Diagnosis not present

## 2021-06-30 DIAGNOSIS — R69 Illness, unspecified: Secondary | ICD-10-CM | POA: Diagnosis not present

## 2021-06-30 DIAGNOSIS — I672 Cerebral atherosclerosis: Secondary | ICD-10-CM | POA: Diagnosis present

## 2021-06-30 DIAGNOSIS — E7800 Pure hypercholesterolemia, unspecified: Secondary | ICD-10-CM | POA: Diagnosis present

## 2021-06-30 DIAGNOSIS — Z83438 Family history of other disorder of lipoprotein metabolism and other lipidemia: Secondary | ICD-10-CM

## 2021-06-30 DIAGNOSIS — I252 Old myocardial infarction: Secondary | ICD-10-CM | POA: Diagnosis not present

## 2021-06-30 DIAGNOSIS — Z87820 Personal history of traumatic brain injury: Secondary | ICD-10-CM

## 2021-06-30 DIAGNOSIS — Z8249 Family history of ischemic heart disease and other diseases of the circulatory system: Secondary | ICD-10-CM | POA: Diagnosis not present

## 2021-06-30 DIAGNOSIS — W19XXXA Unspecified fall, initial encounter: Secondary | ICD-10-CM | POA: Diagnosis not present

## 2021-06-30 DIAGNOSIS — R5381 Other malaise: Secondary | ICD-10-CM | POA: Diagnosis not present

## 2021-06-30 DIAGNOSIS — S0083XA Contusion of other part of head, initial encounter: Secondary | ICD-10-CM | POA: Diagnosis not present

## 2021-06-30 DIAGNOSIS — S0990XA Unspecified injury of head, initial encounter: Secondary | ICD-10-CM | POA: Diagnosis not present

## 2021-06-30 DIAGNOSIS — I639 Cerebral infarction, unspecified: Secondary | ICD-10-CM | POA: Diagnosis not present

## 2021-06-30 DIAGNOSIS — N189 Chronic kidney disease, unspecified: Secondary | ICD-10-CM | POA: Diagnosis not present

## 2021-06-30 DIAGNOSIS — Z880 Allergy status to penicillin: Secondary | ICD-10-CM | POA: Diagnosis not present

## 2021-06-30 DIAGNOSIS — R42 Dizziness and giddiness: Secondary | ICD-10-CM | POA: Diagnosis not present

## 2021-06-30 DIAGNOSIS — I2510 Atherosclerotic heart disease of native coronary artery without angina pectoris: Secondary | ICD-10-CM | POA: Diagnosis present

## 2021-06-30 DIAGNOSIS — Z87440 Personal history of urinary (tract) infections: Secondary | ICD-10-CM

## 2021-06-30 DIAGNOSIS — I69354 Hemiplegia and hemiparesis following cerebral infarction affecting left non-dominant side: Secondary | ICD-10-CM | POA: Diagnosis not present

## 2021-06-30 DIAGNOSIS — G9389 Other specified disorders of brain: Secondary | ICD-10-CM | POA: Diagnosis not present

## 2021-06-30 DIAGNOSIS — F17210 Nicotine dependence, cigarettes, uncomplicated: Secondary | ICD-10-CM | POA: Diagnosis present

## 2021-06-30 DIAGNOSIS — R531 Weakness: Secondary | ICD-10-CM | POA: Diagnosis not present

## 2021-06-30 DIAGNOSIS — Z96653 Presence of artificial knee joint, bilateral: Secondary | ICD-10-CM | POA: Diagnosis present

## 2021-06-30 DIAGNOSIS — R001 Bradycardia, unspecified: Secondary | ICD-10-CM | POA: Diagnosis present

## 2021-06-30 DIAGNOSIS — I10 Essential (primary) hypertension: Secondary | ICD-10-CM

## 2021-06-30 DIAGNOSIS — I6319 Cerebral infarction due to embolism of other precerebral artery: Secondary | ICD-10-CM | POA: Diagnosis not present

## 2021-06-30 LAB — CBC
HCT: 36.4 % (ref 36.0–46.0)
Hemoglobin: 13 g/dL (ref 12.0–15.0)
MCH: 33.1 pg (ref 26.0–34.0)
MCHC: 35.7 g/dL (ref 30.0–36.0)
MCV: 92.6 fL (ref 80.0–100.0)
Platelets: 317 10*3/uL (ref 150–400)
RBC: 3.93 MIL/uL (ref 3.87–5.11)
RDW: 13.5 % (ref 11.5–15.5)
WBC: 9.6 10*3/uL (ref 4.0–10.5)
nRBC: 0 % (ref 0.0–0.2)

## 2021-06-30 LAB — URINALYSIS, COMPLETE (UACMP) WITH MICROSCOPIC
Bacteria, UA: NONE SEEN
Bilirubin Urine: NEGATIVE
Glucose, UA: NEGATIVE mg/dL
Hgb urine dipstick: NEGATIVE
Ketones, ur: NEGATIVE mg/dL
Leukocytes,Ua: NEGATIVE
Nitrite: NEGATIVE
Protein, ur: NEGATIVE mg/dL
Specific Gravity, Urine: 1.009 (ref 1.005–1.030)
pH: 6 (ref 5.0–8.0)

## 2021-06-30 LAB — BASIC METABOLIC PANEL
Anion gap: 10 (ref 5–15)
BUN: 16 mg/dL (ref 8–23)
CO2: 30 mmol/L (ref 22–32)
Calcium: 9.5 mg/dL (ref 8.9–10.3)
Chloride: 99 mmol/L (ref 98–111)
Creatinine, Ser: 0.88 mg/dL (ref 0.44–1.00)
GFR, Estimated: >60 mL/min (ref >60)
Glucose, Bld: 131 mg/dL — ABNORMAL HIGH (ref 70–99)
Potassium: 2.8 mmol/L — ABNORMAL LOW (ref 3.5–5.1)
Sodium: 139 mmol/L (ref 135–145)

## 2021-06-30 LAB — MAGNESIUM: Magnesium: 2.2 mg/dL (ref 1.7–2.4)

## 2021-06-30 LAB — RESP PANEL BY RT-PCR (FLU A&B, COVID) ARPGX2
Influenza A by PCR: NEGATIVE
Influenza B by PCR: NEGATIVE
SARS Coronavirus 2 by RT PCR: NEGATIVE

## 2021-06-30 LAB — TROPONIN I (HIGH SENSITIVITY)
Troponin I (High Sensitivity): 10 ng/L (ref <18)
Troponin I (High Sensitivity): 12 ng/L (ref <18)

## 2021-06-30 MED ORDER — ACETAMINOPHEN 325 MG PO TABS
650.0000 mg | ORAL_TABLET | Freq: Four times a day (QID) | ORAL | Status: DC | PRN
  Administered 2021-07-02: 650 mg via ORAL
  Filled 2021-06-30: qty 2

## 2021-06-30 MED ORDER — METOPROLOL SUCCINATE ER 50 MG PO TB24
50.0000 mg | ORAL_TABLET | Freq: Every day | ORAL | Status: DC
  Administered 2021-06-30: 50 mg via ORAL
  Filled 2021-06-30 (×2): qty 1

## 2021-06-30 MED ORDER — ONDANSETRON HCL 4 MG PO TABS: 4.0000 mg | ORAL_TABLET | Freq: Four times a day (QID) | ORAL | Status: DC | PRN

## 2021-06-30 MED ORDER — MAGNESIUM HYDROXIDE 400 MG/5ML PO SUSP: 30.0000 mL | Freq: Every day | ORAL | Status: DC | PRN

## 2021-06-30 MED ORDER — TRAZODONE HCL 50 MG PO TABS
25.0000 mg | ORAL_TABLET | Freq: Every evening | ORAL | Status: DC | PRN
  Administered 2021-07-01: 25 mg via ORAL
  Filled 2021-06-30: qty 1

## 2021-06-30 MED ORDER — PANTOPRAZOLE SODIUM 40 MG PO TBEC
40.0000 mg | DELAYED_RELEASE_TABLET | Freq: Every day | ORAL | Status: DC
  Administered 2021-06-30 – 2021-07-01 (×2): 40 mg via ORAL
  Filled 2021-06-30 (×2): qty 1

## 2021-06-30 MED ORDER — POTASSIUM CHLORIDE 20 MEQ PO PACK
40.0000 meq | PACK | Freq: Once | ORAL | Status: AC
  Administered 2021-06-30: 40 meq via ORAL
  Filled 2021-06-30: qty 2

## 2021-06-30 MED ORDER — FLUOXETINE HCL 20 MG PO CAPS
40.0000 mg | ORAL_CAPSULE | Freq: Every day | ORAL | Status: DC
  Administered 2021-06-30 – 2021-07-02 (×3): 40 mg via ORAL
  Filled 2021-06-30 (×3): qty 2

## 2021-06-30 MED ORDER — SODIUM CHLORIDE 0.9 % IV SOLN: INTRAVENOUS | Status: DC

## 2021-06-30 MED ORDER — ENOXAPARIN SODIUM 40 MG/0.4ML IJ SOSY
40.0000 mg | PREFILLED_SYRINGE | INTRAMUSCULAR | Status: DC
  Administered 2021-06-30 – 2021-07-01 (×2): 40 mg via SUBCUTANEOUS
  Filled 2021-06-30 (×2): qty 0.4

## 2021-06-30 MED ORDER — ROSUVASTATIN CALCIUM 10 MG PO TABS
20.0000 mg | ORAL_TABLET | Freq: Every day | ORAL | Status: DC
  Administered 2021-06-30 – 2021-07-01 (×2): 20 mg via ORAL
  Filled 2021-06-30: qty 2
  Filled 2021-06-30 (×2): qty 1

## 2021-06-30 MED ORDER — TRIAMTERENE-HCTZ 37.5-25 MG PO CAPS
1.0000 | ORAL_CAPSULE | Freq: Every day | ORAL | Status: DC
  Filled 2021-06-30: qty 1

## 2021-06-30 MED ORDER — ONDANSETRON HCL 4 MG/2ML IJ SOLN: 4.0000 mg | Freq: Four times a day (QID) | INTRAMUSCULAR | Status: DC | PRN

## 2021-06-30 MED ORDER — NITROGLYCERIN 0.4 MG SL SUBL: 0.4000 mg | SUBLINGUAL_TABLET | SUBLINGUAL | Status: DC | PRN

## 2021-06-30 MED ORDER — STROKE: EARLY STAGES OF RECOVERY BOOK: Freq: Once | Status: DC

## 2021-06-30 MED ORDER — ACETAMINOPHEN 650 MG RE SUPP: 650.0000 mg | Freq: Four times a day (QID) | RECTAL | Status: DC | PRN

## 2021-06-30 MED ORDER — ASPIRIN EC 81 MG PO TBEC
81.0000 mg | DELAYED_RELEASE_TABLET | Freq: Every day | ORAL | Status: DC
  Administered 2021-06-30 – 2021-07-02 (×3): 81 mg via ORAL
  Filled 2021-06-30 (×3): qty 1

## 2021-06-30 MED ORDER — FUROSEMIDE 40 MG PO TABS: 20.0000 mg | ORAL_TABLET | ORAL | Status: DC | PRN

## 2021-06-30 NOTE — ED Notes (Signed)
Ambulated pt to toilet and back to bed.

## 2021-06-30 NOTE — H&P (Addendum)
[place]   PATIENT NAME: [name]    MR#:  [id number]  DATE OF BIRTH:  04-10-1956  DATE OF ADMISSION:  06/30/2021  PRIMARY CARE PHYSICIAN: Baxter Hire, MD   Patient is coming from: HOME  REQUESTING/REFERRING PHYSICIAN: Conni Slipper, MD coronary artery disease, CKD, PVD, depression, hypertension, dyslipidemia, migraine, GERD, hemorrhagic CVA  CHIEF COMPLAINT:   Chief Complaint  Patient presents with   Dizziness   Fall    HISTORY OF PRESENT ILLNESS:  ADREONNA HINCK is a 65 y.o. Caucasian female with medical history significant for coronary artery disease, anxiety, peripheral vascular disease, the pression, hypertension, dyslipidemia, ischemic cardiomyopathy, migraine and hemorrhagic CVA with right thalamic hemorrhage on 6/1 with associated left-sided weakness, who was discharged to [facility] and then went home on 7/2.  2 days prior to going home she fell at the SNF and sustained left proximal humerus fracture that was conservatively managed.  She received physical therapy there and her left-sided weakness significant improved..  She presents emergency room with acute onset of dizziness and admitted to having the syncope and head injury yesterday in the left temporal area.  She denies any new or worsening weakness.  No vertigo or tinnitus.  No chest pain or dyspnea or palpitations.  No cough or wheezing.  No nausea or vomiting.  No dysuria, oliguria or hematuria or flank pain.  ED Course: Upon presentation to the emergency room heart rate was 57 with otherwise normal vital signs.  Labs revealed hypokalemia 2.8 and otherwise normal BMP and CBC.  High-sensitivity troponin I was 10 and later 12.  Once antigens and COVID-19 PCR came back negative.  UA was negative. EKG as reviewed by me : EKG showed sinus bradycardia with rate of 55 with Q waves in V1 and T wave inversion anterolaterally and inferiorly. Imaging: Noncontrasted head CT scan revealed no acute stroke  abnormalities.  It showed her chronic small left basal ganglia lacunar infarct and mild to moderate chronic small vessel ischemic changes in the cerebral white matter.  Left humerus x-ray showed calcific tendinosis with no acute abnormality and no healing fracture present. Brain MRI without contrast revealed the following: 1. Small acute to early subacute left cerebral hemispheric infarcts. 2. Late subacute to chronic right corona radiata lacunar infarct. 3. Severe chronic small vessel ischemic disease with multiple chronic lacunar infarcts as above.  The patient will be admitted to a progressive unit bed for further evaluation and management.   PAST MEDICAL HISTORY:   Past Medical History:  Diagnosis Date   Anxiety    Arthritis    CAD (coronary artery disease)    a. 03/2013 NSTEMI/Cath: 100 RCA, EF 45%->Med Rx;  b. 12/2013 Cath/PCI: LAD 61m 30d, D1 80, D2 60, LCX nl, RCA 50ost/p, 669m95d (2.5x33 Xience DES);  c. 05/2014 Lexi MV: EF 68%, no ischemia; d. stress echo 12/2015 no ischemia @ max exercise (did not achieve target)   Car[place] disease, bilateral ([place]   a. 08/2013 Carotid U/S: 1-39% bilat ICA stenosis.   Chronic kidney disease    Chronic kidney infections. Takes daily preventative.   Complication of anesthesia    [place] vascular accident) ([place]   a. 08/2013.   [place]) ([place]   Decreased libido    Depression    GERD (gastroesophageal reflux disease)    History of 2019 novel coronavirus disease (COVID-19) 07/2019   History of recurrent UTIs    Hypercholesteremia  Hyperlipemia    Hypertension    Insomnia    Ischemic cardiomyopathy    a. 03/2013 Echo: EF 30-35%, mod dil LA, mod-sev MR, mod TR;  b. 08/2013 Echo EF 60-65%, mild AI.   Menopause    Migraines    Myocardial infarction (HCC)    PONV (postoperative nausea and vomiting)    If anesthsia is given slowly, pt will not throw up, if given quickly, pt will have nausea and vomiting.   Right  lower quadrant pain    Syncope and collapse    Tobacco abuse    Vaginal atrophy     PAST SURGICAL HISTORY:   Past Surgical History:  Procedure Laterality Date   CARDIAC CATHETERIZATION  01/01/2014   CARDIAC CATHETERIZATION  03/16/2013   CARDIAC CATHETERIZATION  12/2013   CARDIAC CATHETERIZATION N/A 08/19/2016   Procedure: Left Heart Cath and Coronary Angiography;  Surgeon: Minna Merritts, MD;  Location: [place] [facility];  Service: Cardiovascular;  Laterality: N/A;   CESAREAN SECTION WITH BILATERAL TUBAL LIGATION     CORONARY ANGIOPLASTY WITH STENT PLACEMENT  01/01/2014   95% lesion with a drug eluting stent to the distal RCA.   ENDOMETRIAL ABLATION     KNEE ARTHROSCOPY  11/27/2006   left knee    OOPHORECTOMY     TOTAL KNEE ARTHROPLASTY Left 10/06/2016   Procedure: TOTAL KNEE ARTHROPLASTY;  Surgeon: Corky Mull, MD;  Location: [facility] OR[facility];  Service: Orthopedics;  Laterality: Left;   TOTAL KNEE ARTHROPLASTY Right 11/12/2020   Procedure: TOTAL KNEE ARTHROPLASTY;  Surgeon: Corky Mull, MD;  Location: [facility] OR[facility];  Service: Orthopedics;  Laterality: Right;   TUBAL LIGATION      SOCIAL HISTORY:   Social History   Tobacco Use   Smoking status: Every Day    Packs/day: 1.50    Years: 20.00    Pack years: 30.00    Types: Cigarettes    Last attempt to quit: 04/07/2013    Years since quitting: 8.2   Smokeless tobacco: Never  Substance Use Topics   Alcohol use: Yes    Comment: 1 glass of wine a month    FAMILY HISTORY:   Family History  Problem Relation Age of Onset   Hypertension Mother    Stroke Mother    Hyperlipidemia Mother    Breast cancer Paternal Grandmother    Colon cancer Neg Hx    Ovarian cancer Neg Hx    Heart disease Neg Hx    Diabetes Neg Hx     DRUG ALLERGIES:   Allergies  Allergen Reactions   Codeine Sulfate Other (See Comments)    sick   Penicillins Nausea And Vomiting    Has patient had a PCN reaction causing immediate rash, facial/tongue/throat  swelling, SOB or lightheadedness with hypotension: No Has patient had a PCN reaction causing severe rash involving mucus membranes or skin necrosis: No Has patient had a PCN reaction that required hospitalization No Has patient had a PCN reaction occurring within the last 10 years: No If all of the above answers are "NO", then may proceed with Cephalosporin use.     REVIEW OF SYSTEMS:   ROS As per history of present illness. All pertinent systems were reviewed above. Constitutional, HEENT, cardiovascular, respiratory, GI, GU, musculoskeletal, neuro, psychiatric, endocrine, integumentary and hematologic systems were reviewed and are otherwise negative/unremarkable except for positive findings mentioned above in the HPI.   MEDICATIONS AT HOME:   Prior to Admission medications   Medication Sig Start  Date End Date Taking? Authorizing Provider  acetaminophen (TYLENOL) 500 MG tablet Take 500 mg by mouth every 6 (six) hours as needed for moderate pain or mild pain.   Yes [provider]  FLUoxetine (PROZAC) 40 MG capsule Take 40 mg by mouth daily.   Yes [provider]  furosemide (LASIX) 20 MG tablet Take 20 mg by mouth as needed (fluid retention).   Yes [provider]  metoprolol succinate (TOPROL-XL) 50 MG 24 hr tablet Take 50 mg by mouth daily. Take with or immediately following a meal.   Yes [provider]  nitroGLYCERIN (NITROSTAT) 0.4 MG SL tablet Place 0.4 mg under the tongue every 5 (five) minutes as needed for chest pain.   Yes [provider]  pantoprazole (PROTONIX) 40 MG tablet Take 1 tablet (40 mg total) by mouth at bedtime. 04/10/21  Yes Flora Lipps, MD  rosuvastatin (CRESTOR) 20 MG tablet Take 20 mg by mouth daily.   Yes [provider]  triamterene-hydrochlorothiazide (DYAZIDE) 37.5-25 MG capsule Take 1 capsule by mouth daily.   Yes [provider]  amLODipine (NORVASC) 10 MG tablet Take 1 tablet (10 mg total) by  mouth daily. Patient not taking: No sig reported 04/11/21   Flora Lipps, MD      VITAL SIGNS:  Blood pressure 125/66, pulse 94, temperature 98.2 F (36.8 C), resp. rate 16, height '5\' 2"'$  (1.575 m), weight 66.3 kg, SpO2 98 %.  PHYSICAL EXAMINATION:  Physical Exam  GENERAL:  65 y.o.-year-old patient lying in the bed with no acute distress.  EYES: Pupils equal, round, reactive to light and accommodation. No scleral icterus. Extraocular muscles intact.  HEENT: Head atraumatic, normocephalic. Oropharynx and nasopharynx clear.  NECK:  Supple, no jugular venous distention. No thyroid enlargement, no tenderness.  LUNGS: Normal breath sounds bilaterally, no wheezing, rales,rhonchi or crepitation. No use of accessory muscles of respiration.  CARDIOVASCULAR: Regular rate and rhythm, S1, S2 normal. No murmurs, rubs, or gallops.  ABDOMEN: Soft, nondistended, nontender. Bowel sounds present. No organomegaly or mass.  EXTREMITIES: No pedal edema, cyanosis, or clubbing.  NEUROLOGIC: Cranial nerves II through XII are intact. Muscle strength 5/5 in all extremities. Sensation intact. Gait not checked.  PSYCHIATRIC: The patient is alert and oriented x 3.  Normal affect and good eye contact. SKIN: No obvious rash, lesion, or ulcer.   LABORATORY PANEL:   CBC Recent Labs  Lab 06/30/21 1139  WBC 9.6  HGB 13.0  HCT 36.4  PLT 317   ------------------------------------------------------------------------------------------------------------------  Chemistries  Recent Labs  Lab 06/30/21 1139  NA 139  K 2.8*  CL 99  CO2 30  GLUCOSE 131*  BUN 16  CREATININE 0.88  CALCIUM 9.5   ------------------------------------------------------------------------------------------------------------------  Cardiac Enzymes No results for input(s): TROPONINI in the last 168 hours. ------------------------------------------------------------------------------------------------------------------  RADIOLOGY:  CT  HEAD WO CONTRAST  Result Date: 06/30/2021 CLINICAL DATA:  Fall yesterday with left head injury. Dizziness this morning. History of CVA. EXAM: CT HEAD WITHOUT CONTRAST TECHNIQUE: Contiguous axial images were obtained from the base of the skull through the vertex without intravenous contrast. COMPARISON:  04/10/2021 head CT. FINDINGS: Brain: Nonspecific mild to moderate subcortical and periventricular white matter hypodensity, most in keeping with chronic small vessel ischemic change. Chronic small left basal ganglia lacunar infarct. No evidence of parenchymal hemorrhage or extra-axial fluid collection. No mass lesion, mass effect, or midline shift. Previously visualized right thalamic hematoma has resolved. No CT evidence of acute infarction. Cerebral volume is age appropriate. No ventriculomegaly.  Vascular: No acute abnormality. Skull: No evidence of calvarial fracture. Sinuses/Orbits: The visualized paranasal sinuses are essentially clear. Other:  The mastoid air cells are unopacified. IMPRESSION: 1. No evidence of acute intracranial abnormality. No evidence of calvarial fracture. 2. Mild-to-moderate chronic small vessel ischemic changes in the cerebral white matter. 3. Chronic small left basal ganglia lacunar infarct. Electronically Signed   By: Ilona Sorrel M.D.   On: 06/30/2021 12:35   MR BRAIN WO CONTRAST  Result Date: 06/30/2021 CLINICAL DATA:  Dizziness, persistent/recurrent, cardiac or vascular cause suspected. Fall yesterday and dizziness today. EXAM: MRI HEAD WITHOUT CONTRAST TECHNIQUE: Multiplanar, multiecho pulse sequences of the brain and surrounding structures were obtained without intravenous contrast. COMPARISON:  Head CT 06/30/2021 and MRI 04/10/2021 FINDINGS: Brain: There are several subcentimeter foci of diffusion weighted signal abnormality involving white matter of the left frontal, parietal, and occipital lobes compatible with acute to early subacute infarcts. There is encephalomalacia in  the right thalamus related to the previous hemorrhage. Chronic lacunar infarcts are again noted in the left basal ganglia, left internal capsule, left thalamus, and bilateral hemispheric white matter. A lacunar infarct in the posterior right corona radiata is new from the prior MRI but is late subacute to chronic in appearance. Patchy T2 hyperintensities elsewhere in the cerebral white matter bilaterally are similar to the prior MRI and are nonspecific but compatible with severe chronic small vessel ischemic disease. A small focus of chronic hemorrhage in the left frontal lobe is unchanged from the prior MRI and is again noted to be adjacent to a curvilinear focus of susceptibility which may reflect a developmental venous anomaly and cavernoma. There is mild cerebral atrophy. No mass, midline shift, or extra-axial fluid collection is evident. Vascular: Major intracranial vascular flow voids are preserved. Skull and upper cervical spine: Unremarkable bone marrow signal. Sinuses/Orbits: Unremarkable orbits. Small volume fluid in the right sphenoid sinus. Clear mastoid air cells. Other: None. IMPRESSION: 1. Small acute to early subacute left cerebral hemispheric infarcts. 2. Late subacute to chronic right corona radiata lacunar infarct. 3. Severe chronic small vessel ischemic disease with multiple chronic lacunar infarcts as above. Electronically Signed   By: Logan Bores M.D.   On: 06/30/2021 14:32   DG Humerus Left  Result Date: 06/30/2021 CLINICAL DATA:  Fall.  Pain.  Remote fracture. EXAM: LEFT HUMERUS - 2+ VIEW COMPARISON:  Report of left humerus radiographs 05/15/2021 at [facility] clinic. Images are not currently available. FINDINGS: Calcific tendinosis present. Shoulder novel joints are located. No acute abnormalities are present. No healing fractures are present. IMPRESSION: 1. Calcific tendinosis. 2. No acute abnormality. Electronically Signed   By: San Morelle M.D.   On: 06/30/2021 15:13       IMPRESSION AND PLAN:  Active Problems:   CVA (cerebral vascular accident) ([place])  1.  Acute CVA with small acute to early subacute left cerebral hemispheric infarcts.  This is concerning for embolic stroke. - The patient will be admitted to a progressive unit bed. - We will follow neurochecks every 4 hours for 24 hours. - PT/ST and OT as well as neurology consult to be obtained. - Recent 2D echo revealed EF of 55 to 60% and grade 1 diastolic dysfunction with no PFO. - We will continue the patient on aspirin and statin therapy.  2.  Dizziness and syncope and fall with left upper extremity pain. - While this could be related to #1, we will continue to monitor for arrhythmias. - We will check orthostatics every 12 hours. -  She will be hydrated with IV normal saline. - The patient left humerus x-ray showed no evidence for fracture or acute abnormalities. - Pain management will be provided.  3.  Essential hypertension. - We will continue antihypertensives with permissive parameters.  4.  Coronary artery disease. - Continue as needed sublingual nitroglycerin and Toprol-XL.  5.  Dyslipidemia. - We will continue statin therapy.  DVT prophylaxis: Lovenox.  Code Status: full code.  Family Communication:  The plan of care was discussed in details with the patient (and family). I answered all questions. The patient agreed to proceed with the above mentioned plan. Further management will depend upon hospital course. Disposition Plan: Back to previous home environment Consults called: Neurology. All the records are reviewed and case discussed with ED provider.  Status is: Inpatient  Remains inpatient appropriate because:Ongoing diagnostic testing needed not appropriate for outpatient work up, Unsafe d/c plan, IV treatments appropriate due to intensity of illness or inability to take PO, and Inpatient level of care appropriate due to severity of illness  Dispo: The patient is from:  Home              Anticipated d/c is to: Home              Patient currently is not medically stable to d/c.   Difficult to place patient No   TOTAL TIME TAKING CARE OF THIS PATIENT: 55 minutes.    Christel Mormon M.D on 06/30/2021 at 3:37 PM  [facility]   From 7 PM-7 AM, contact night-coverage [email or web address]  CC: Primary care physician; Baxter Hire, MD

## 2021-06-30 NOTE — ED Notes (Signed)
Pt resting comfortably and on the phone with family

## 2021-06-30 NOTE — ED Notes (Signed)
Pt passed swallow screen. And given glass of water.

## 2021-06-30 NOTE — ED Triage Notes (Signed)
Pt comes via [facility] with c/o fall yesterday at 13:00. Pt states she feels unsteady  VSS  Pt did have stroke month ago with left sided weakness as result. Pt has hx of stents.

## 2021-06-30 NOTE — ED Notes (Signed)
Switched pt from ED stretcher to hospital bed for comfort.

## 2021-06-30 NOTE — ED Notes (Signed)
Rn to bedside to introduce self. Pt is CAOx4 and in no distress. Pt advised she is here bc she is dizzy and this happens often due to her HX of stroke in July.

## 2021-06-30 NOTE — ED Triage Notes (Signed)
Pt comes into the ED via [facility] from home c/o fall yesterday and dizziness today.  Pt states she is unsure of what made her fall yesterday, she denies tripping or losing her balance, she just remembers falling.  Pt has baseline of left sided weakness from a previous stroke.  Pt states she woke up with dizziness this morning when she was trying to get out of bed.  Pt denies any blood thinner use.  PT currently in NAD with even and unlabored respirations.

## 2021-06-30 NOTE — ED Provider Notes (Signed)
[facility] Emergency Department Provider Note   ____________________________________________   Event Date/Time   First MD Initiated Contact with Patient 06/30/21 1320     (approximate)  I have reviewed the triage vital signs and the nursing notes.   HISTORY  Chief Complaint Dizziness and Fall   HPI [name] is a 65 y.o. female reports she was standing in the kitchen yesterday and woke up on the floor.  Since then she has had some vertigo.  This is a spinning sensation worse with head movement.  She is not having any numbness tenderness.  She has had vertigo in the past.  She has had a stroke recently.  She is not having any chest pain or tightness or shortness of breath.        Past Medical History:  Diagnosis Date   Anxiety    Arthritis    CAD (coronary artery disease)    a. 03/2013 NSTEMI/Cath: 100 RCA, EF 45%->Med Rx;  b. 12/2013 Cath/PCI: LAD 76m 30d, D1 80, D2 60, LCX nl, RCA 50ost/p, 659m95d (2.5x33 Xience DES);  c. 05/2014 Lexi MV: EF 68%, no ischemia; d. stress echo 12/2015 no ischemia @ max exercise (did not achieve target)   Carotid disease, bilateral (H[place]   a. 08/2013 Carotid U/S: 1-39% bilat ICA stenosis.   Chronic kidney disease    Chronic kidney infections. Takes daily preventative.   Complication of anesthesia    CVA (cerebral vascular accident) ([facility]   a. 08/2013.   CVA (cerebral vascular accident) (HCPee[place]   Decreased libido    Depression    GERD (gastroesophageal reflux disease)    History of 2019 novel coronavirus disease (COVID-19) 07/2019   History of recurrent UTIs    Hypercholesteremia    Hyperlipemia    Hypertension    Insomnia    Ischemic cardiomyopathy    a. 03/2013 Echo: EF 30-35%, mod dil LA, mod-sev MR, mod TR;  b. 08/2013 Echo EF 60-65%, mild AI.   Menopause    Migraines    Myocardial infarction (HCC)    PONV (postoperative nausea and vomiting)    If anesthsia is given slowly, pt will not throw up, if  given quickly, pt will have nausea and vomiting.   Right lower quadrant pain    Syncope and collapse    Tobacco abuse    Vaginal atrophy     Patient Active Problem List   Diagnosis Date Noted   Thalamic hemorrhage (HC[facility]06/12/2020   ICH (intracerebral hemorrhage) (HCBland06/12/2020   Pelvic pain-right lower quadrant 07/14/2018   Right lower quadrant abdominal pain 06/30/2018   Bilateral carotid artery stenosis [id number] Chronic systolic CHF (congestive heart failure) (HCCrook[place]05/02/2018   Status post total knee replacement using cement, left 10/06/2016   History of coronary artery stent placement    Pure hypercholesterolemia    Recurrent UTI 02/11/2016   Menopause 02/11/2016   Angina pectoris (HCKermit02/28/2017   Syncope 07/11/2015   Orthostatic hypotension 02/19/2015   Dehydration 02/19/2015   Hypokalemia 02/19/2015   Stroke (HCConde04/09/2015   Tobacco abuse 02/18/2015   History of stroke 07/18/2014   Vertigo 07/18/2014   Bilateral wrist pain 07/18/2014   Atypical chest pain 06/05/2014   Coronary artery disease of native artery of native heart with stable angina pectoris (HCLouin03/13/2015   Carotid arterial disease (HCSeltzer03/13/2015   Depression 01/16/2014   Memory loss 01/16/2014   Right sided weakness 08/10/2013   Essential  hypertension 08/10/2013   Mixed hyperlipidemia 08/10/2013   History of MI (myocardial infarction) 08/10/2013   Stroke, acute, embolic ([place]) [id number]    Past Surgical History:  Procedure Laterality Date   CARDIAC CATHETERIZATION  01/01/2014   CARDIAC CATHETERIZATION  03/16/2013   CARDIAC CATHETERIZATION  12/2013   CARDIAC CATHETERIZATION N/A 08/19/2016   Procedure: Left Heart Cath and Coronary Angiography;  Surgeon: Minna Merritts, MD;  Location: [facility];  Service: Cardiovascular;  Laterality: N/A;   CESAREAN SECTION WITH BILATERAL TUBAL LIGATION     CORONARY ANGIOPLASTY WITH STENT PLACEMENT  01/01/2014   95% lesion with a drug eluting  stent to the distal RCA.   ENDOMETRIAL ABLATION     KNEE ARTHROSCOPY  11/27/2006   left knee    OOPHORECTOMY     TOTAL KNEE ARTHROPLASTY Left 10/06/2016   Procedure: TOTAL KNEE ARTHROPLASTY;  Surgeon: Corky Mull, MD;  Location: [facility];  Service: Orthopedics;  Laterality: Left;   TOTAL KNEE ARTHROPLASTY Right 11/12/2020   Procedure: TOTAL KNEE ARTHROPLASTY;  Surgeon: Corky Mull, MD;  Location: [facility];  Service: Orthopedics;  Laterality: Right;   TUBAL LIGATION      Prior to Admission medications   Medication Sig Start Date End Date Taking? Authorizing Provider  acetaminophen (TYLENOL) 500 MG tablet Take 500 mg by mouth every 6 (six) hours as needed for moderate pain or mild pain.    [provider]  amLODipine (NORVASC) 10 MG tablet Take 1 tablet (10 mg total) by mouth daily. 04/11/21   Flora Lipps, MD  FLUoxetine (PROZAC) 40 MG capsule Take 40 mg by mouth daily.    [provider]  furosemide (LASIX) 20 MG tablet Take 20 mg by mouth as needed (fluid retention).    [provider]  metoprolol succinate (TOPROL-XL) 50 MG 24 hr tablet Take 50 mg by mouth daily. Take with or immediately following a meal.    [provider]  nitroGLYCERIN (NITROSTAT) 0.4 MG SL tablet Place 0.4 mg under the tongue every 5 (five) minutes as needed for chest pain.    [provider]  ondansetron (ZOFRAN) 4 MG tablet Take 4 mg by mouth every 8 (eight) hours as needed for nausea or vomiting.    [provider]  pantoprazole (PROTONIX) 40 MG tablet Take 1 tablet (40 mg total) by mouth at bedtime. 04/10/21   Flora Lipps, MD  rosuvastatin (CRESTOR) 20 MG tablet Take 20 mg by mouth daily.    [provider]  triamterene-hydrochlorothiazide (DYAZIDE) 37.5-25 MG capsule Take 1 capsule by mouth daily.    [provider]    Allergies Codeine sulfate and Penicillins  Family History  Problem Relation Age of Onset   Hypertension Mother    Stroke  Mother    Hyperlipidemia Mother    Breast cancer Paternal Grandmother    Colon cancer Neg Hx    Ovarian cancer Neg Hx    Heart disease Neg Hx    Diabetes Neg Hx     Social History Social History   Tobacco Use   Smoking status: Every Day    Packs/day: 1.50    Years: 20.00    Pack years: 30.00    Types: Cigarettes    Last attempt to quit: 04/07/2013    Years since quitting: 8.2   Smokeless tobacco: Never  Vaping Use   Vaping Use: Never used  Substance Use Topics   Alcohol use: Yes    Comment: 1 glass of  wine a month   Drug use: No    Review of Systems  Constitutional: No fever/chills Eyes: No visual changes. ENT: No sore throat. Cardiovascular: Denies chest pain. Respiratory: Denies shortness of breath. Gastrointestinal: No abdominal pain.  No nausea, no vomiting.  No diarrhea.  No constipation. Genitourinary: Negative for dysuria. Musculoskeletal: Negative for back pain. Skin: Negative for rash. Neurological: Negative for headaches, focal weakness   ____________________________________________   PHYSICAL EXAM:  VITAL SIGNS: ED Triage Vitals  Enc Vitals Group     BP 06/30/21 1130 128/70     Pulse Rate 06/30/21 1130 (!) 57     Resp 06/30/21 1130 19     Temp 06/30/21 1130 98.2 F (36.8 C)     Temp src --      SpO2 06/30/21 1130 95 %     Weight 06/30/21 1140 146 lb 2.6 oz (66.3 kg)     Height 06/30/21 1140 '5\' 2"'$  (1.575 m)     Head Circumference --      Peak Flow --      Pain Score 06/30/21 1139 8     Pain Loc --      Pain Edu? --      Excl. in [place]? --    Constitutional: Alert and oriented. Well appearing and in no acute distress. Eyes: Conjunctivae are normal. PER. EOMI. Head: Atraumatic. Nose: No congestion/rhinnorhea. Mouth/Throat: Mucous membranes are moist.  Oropharynx non-erythematous. Neck: No stridor.  Cardiovascular: Normal rate, regular rhythm. Grossly normal heart sounds.  Good peripheral circulation. Respiratory: Normal respiratory effort.   No retractions. Lungs CTAB. Gastrointestinal: Soft and nontender. No distention. No abdominal bruits. No CVA tenderness. Musculoskeletal: No lower extremity tenderness nor edema.   Neurologic:  Normal speech and language.  Patient reports the stroke left her with some left-sided weakness.  Additionally she fell and broke her left arm a few weeks ago.  When she fell yesterday she hurt her arm again.  It is not hurting more than it had been.  Cranial nerves II through XII are intact.  Finger-nose and rapid alternating movements and hands were checked.  Patient is unable to do finger-to-nose with her left hand due to the fracture but she can find and touch my finger without difficulty.  Rapid alternating movements are slow on the left side.  Heel-to-shin is equal bilaterally.  Grip strength is equal bilaterally.  Patient does not report any numbness.  Thrust testing does not show any deviation.  Do not see any nystagmus although the patient reports dizziness when her head is turned to the right. Skin:  Skin is warm, dry and intact. No rash noted. Psychiatric: Mood and affect are normal. Speech and behavior are normal.  ____________________________________________   LABS (all labs ordered are listed, but only abnormal results are displayed)  Labs Reviewed  BASIC METABOLIC PANEL - Abnormal; Notable for the following components:      Result Value   Potassium 2.8 (*)    Glucose, Bld 131 (*)    All other components within normal limits  RESP PANEL BY RT-PCR (FLU A&B, COVID) ARPGX2  CBC  URINALYSIS, COMPLETE (UACMP) WITH MICROSCOPIC  TROPONIN I (HIGH SENSITIVITY)   ____________________________________________  EKG EKG read interpreted by me shows sinus bradycardia rate of 55 left axis there is some what more T wave inversion in V3 through 6 than there was previously and some slight ST segment downsloping in lead II with less of a T wave than there was previously additionally lead III  and F also seem  to have slightly inverted T waves.  These are also new.  ____________________________________________  RADIOLOGY Gertha Calkin, personally viewed and evaluated these images (plain radiographs) as part of my medical decision making, as well as reviewing the written report by the radiologist.  ED MD interpretation: CT read by radiology reviewed by me shows some old changes but nothing new.  Official radiology report(s): CT HEAD WO CONTRAST  Result Date: 06/30/2021 CLINICAL DATA:  Fall yesterday with left head injury. Dizziness this morning. History of CVA. EXAM: CT HEAD WITHOUT CONTRAST TECHNIQUE: Contiguous axial images were obtained from the base of the skull through the vertex without intravenous contrast. COMPARISON:  04/10/2021 head CT. FINDINGS: Brain: Nonspecific mild to moderate subcortical and periventricular white matter hypodensity, most in keeping with chronic small vessel ischemic change. Chronic small left basal ganglia lacunar infarct. No evidence of parenchymal hemorrhage or extra-axial fluid collection. No mass lesion, mass effect, or midline shift. Previously visualized right thalamic hematoma has resolved. No CT evidence of acute infarction. Cerebral volume is age appropriate. No ventriculomegaly. Vascular: No acute abnormality. Skull: No evidence of calvarial fracture. Sinuses/Orbits: The visualized paranasal sinuses are essentially clear. Other:  The mastoid air cells are unopacified. IMPRESSION: 1. No evidence of acute intracranial abnormality. No evidence of calvarial fracture. 2. Mild-to-moderate chronic small vessel ischemic changes in the cerebral white matter. 3. Chronic small left basal ganglia lacunar infarct. Electronically Signed   By: Ilona Sorrel M.D.   On: 06/30/2021 12:35   MR BRAIN WO CONTRAST  Result Date: 06/30/2021 CLINICAL DATA:  Dizziness, persistent/recurrent, cardiac or vascular cause suspected. Fall yesterday and dizziness today. EXAM: MRI HEAD WITHOUT  CONTRAST TECHNIQUE: Multiplanar, multiecho pulse sequences of the brain and surrounding structures were obtained without intravenous contrast. COMPARISON:  Head CT 06/30/2021 and MRI 04/10/2021 FINDINGS: Brain: There are several subcentimeter foci of diffusion weighted signal abnormality involving white matter of the left frontal, parietal, and occipital lobes compatible with acute to early subacute infarcts. There is encephalomalacia in the right thalamus related to the previous hemorrhage. Chronic lacunar infarcts are again noted in the left basal ganglia, left internal capsule, left thalamus, and bilateral hemispheric white matter. A lacunar infarct in the posterior right corona radiata is new from the prior MRI but is late subacute to chronic in appearance. Patchy T2 hyperintensities elsewhere in the cerebral white matter bilaterally are similar to the prior MRI and are nonspecific but compatible with severe chronic small vessel ischemic disease. A small focus of chronic hemorrhage in the left frontal lobe is unchanged from the prior MRI and is again noted to be adjacent to a curvilinear focus of susceptibility which may reflect a developmental venous anomaly and cavernoma. There is mild cerebral atrophy. No mass, midline shift, or extra-axial fluid collection is evident. Vascular: Major intracranial vascular flow voids are preserved. Skull and upper cervical spine: Unremarkable bone marrow signal. Sinuses/Orbits: Unremarkable orbits. Small volume fluid in the right sphenoid sinus. Clear mastoid air cells. Other: None. IMPRESSION: 1. Small acute to early subacute left cerebral hemispheric infarcts. 2. Late subacute to chronic right corona radiata lacunar infarct. 3. Severe chronic small vessel ischemic disease with multiple chronic lacunar infarcts as above. Electronically Signed   By: Logan Bores M.D.   On: 06/30/2021 14:32    ____________________________________________   PROCEDURES  Procedure(s)  performed (including Critical Care): Rickel care time 25 minutes.  This includes speaking to the patient and the patient's friend reviewing her old  records and old studies  and consulting the neurologist and the hospitalist.  Procedures   ____________________________________________   INITIAL IMPRESSION / ASSESSMENT AND PLAN / ED COURSE  Patient had syncope yesterday and has some EKG changes today compared with old EKG.  I have added a troponin and we will see what that looks like.             ____________________________________________   FINAL CLINICAL IMPRESSION(S) / ED DIAGNOSES  Final diagnoses:  Cerebrovascular accident (CVA) due to thrombosis of precerebral artery ([place])  Syncope, unspecified syncope type  Left arm pain  Vertigo     ED Discharge Orders     None        Note:  This document was prepared using Dragon voice recognition software and may include unintentional dictation errors.   Nena Polio, MD 06/30/21 [phone]

## 2021-07-01 ENCOUNTER — Inpatient Hospital Stay: Payer: PPO

## 2021-07-01 ENCOUNTER — Inpatient Hospital Stay (HOSPITAL_COMMUNITY)
Admit: 2021-07-01 | Discharge: 2021-07-01 | Disposition: A | Discharge status: Home / Self Care | Payer: PPO | Attending: Internal Medicine | Admitting: Internal Medicine

## 2021-07-01 DIAGNOSIS — I639 Cerebral infarction, unspecified: Secondary | ICD-10-CM

## 2021-07-01 DIAGNOSIS — I6389 Other cerebral infarction: Secondary | ICD-10-CM

## 2021-07-01 DIAGNOSIS — I6300 Cerebral infarction due to thrombosis of unspecified precerebral artery: Secondary | ICD-10-CM | POA: Diagnosis not present

## 2021-07-01 LAB — BASIC METABOLIC PANEL
Anion gap: 8 (ref 5–15)
BUN: 13 mg/dL (ref 8–23)
CO2: 29 mmol/L (ref 22–32)
Calcium: 9.1 mg/dL (ref 8.9–10.3)
Chloride: 104 mmol/L (ref 98–111)
Creatinine, Ser: 0.99 mg/dL (ref 0.44–1.00)
GFR, Estimated: >60 mL/min (ref >60)
Glucose, Bld: 96 mg/dL (ref 70–99)
Potassium: 3.1 mmol/L — ABNORMAL LOW (ref 3.5–5.1)
Sodium: 141 mmol/L (ref 135–145)

## 2021-07-01 LAB — ECHOCARDIOGRAM COMPLETE
AR max vel: 2.21 cm2
AV Area VTI: 2.63 cm2
AV Area mean vel: 1.98 cm2
AV Mean grad: 2 mmHg
AV Peak grad: 4.2 mmHg
Ao pk vel: 1.02 m/s
Area-P 1/2: 3.76 cm2
Height: 62 in
S' Lateral: 2.89 cm
Weight: 2338.64 oz

## 2021-07-01 LAB — CBC
HCT: 33.2 % — ABNORMAL LOW (ref 36.0–46.0)
Hemoglobin: 11.6 g/dL — ABNORMAL LOW (ref 12.0–15.0)
MCH: 32.1 pg (ref 26.0–34.0)
MCHC: 34.9 g/dL (ref 30.0–36.0)
MCV: 92 fL (ref 80.0–100.0)
Platelets: 266 10*3/uL (ref 150–400)
RBC: 3.61 MIL/uL — ABNORMAL LOW (ref 3.87–5.11)
RDW: 13.4 % (ref 11.5–15.5)
WBC: 7.8 10*3/uL (ref 4.0–10.5)
nRBC: 0 % (ref 0.0–0.2)

## 2021-07-01 LAB — LIPID PANEL
Cholesterol: 147 mg/dL (ref 0–200)
HDL: 32 mg/dL — ABNORMAL LOW (ref >40)
LDL Cholesterol: 74 mg/dL (ref 0–99)
Total CHOL/HDL Ratio: 4.6 RATIO
Triglycerides: 207 mg/dL — ABNORMAL HIGH (ref <150)
VLDL: 41 mg/dL — ABNORMAL HIGH (ref 0–40)

## 2021-07-01 LAB — HEMOGLOBIN A1C
Hgb A1c MFr Bld: 6.2 % — ABNORMAL HIGH (ref 4.8–5.6)
Mean Plasma Glucose: 131.24 mg/dL

## 2021-07-01 MED ORDER — IOHEXOL 350 MG/ML SOLN
75.0000 mL | Freq: Once | INTRAVENOUS | Status: AC | PRN
  Administered 2021-07-01: 75 mL via INTRAVENOUS
  Filled 2021-07-01: qty 75

## 2021-07-01 NOTE — Progress Notes (Signed)
*  PRELIMINARY RESULTS* Echocardiogram 2D Echocardiogram has been performed.  [name] 07/01/2021, 2:29 PM

## 2021-07-01 NOTE — Progress Notes (Signed)
Progress Note    [name]   [id number]  DOB: 1956/09/28  DOA: 06/30/2021     1  PCP: Baxter Hire, MD  Initial CC: dizziness   Hospital Course: [name] is a 65 y.o. Caucasian female with medical history significant for coronary artery disease, anxiety, peripheral vascular disease, depression, hypertension, dyslipidemia, ischemic cardiomyopathy, migraine and hemorrhagic CVA with right thalamic hemorrhage on 6/1 with associated left-sided weakness, who was [facility] and then went home on 7/2.  2 days prior to going home she fell at the SNF and sustained left proximal humerus fracture that was conservatively managed.  She received physical therapy there and her left-sided weakness significant improved..    She presented to the ER with acute onset of dizziness and admitted to having the syncope and head injury yesterday in the left temporal area.  She denies any new or worsening weakness.  No vertigo or tinnitus.  No chest pain or dyspnea or palpitations.  No cough or wheezing.  No nausea or vomiting.  No dysuria, oliguria or hematuria or flank pain.  ED Course: Upon presentation to the emergency room heart rate was 57 with otherwise normal vital signs.  Labs revealed hypokalemia 2.8 and otherwise normal BMP and CBC.  High-sensitivity troponin I was 10 and later 12.  Once antigens and COVID-19 PCR came back negative.  UA was negative. EKG showed sinus bradycardia with rate of 55 with Q waves in V1 and T wave inversion anterolaterally and inferiorly. Imaging: Noncontrasted head CT scan revealed no acute stroke abnormalities.  It showed her chronic small left basal ganglia lacunar infarct and mild to moderate chronic small vessel ischemic changes in the cerebral white matter.  Left humerus x-ray showed calcific tendinosis with no acute abnormality and no healing fracture present. Brain MRI without contrast revealed the following: 1. Small acute to early subacute  left cerebral hemispheric infarcts. 2. Late subacute to chronic right corona radiata lacunar infarct. 3. Severe chronic small vessel ischemic disease with multiple chronic lacunar infarcts as above.  She was admitted for further stroke work-up.  Interval History:  Seen in the ER this morning.  Has not set up much to notice if her dizziness is still present or not.  Otherwise had no other acute concerns.  ROS: Constitutional: negative for chills and fevers, Respiratory: negative for cough, Cardiovascular: negative for chest pain, and Gastrointestinal: negative for abdominal pain  Assessment & Plan:  Acute CVA  -MRI brain shows acute to early subacute left cerebral hemispheric infarcts.  She also has late subacute to chronic right corona radiata lacunar infarct and severe chronic small vessel disease with multiple chronic lacunar infarcts. -Disease pattern is concerning for cardioembolic source given bilateral distribution - continue permissive HTN per neuro for another 24 hours - follow up CTA head/neck - echo ordered per recs - continue asa for now - PT/OT evals  - LDL 74, continue crestor - A1c in process  Dizziness and syncope and fall with left upper extremity pain. - While this could be related to #1, we will continue to monitor for arrhythmias. - repeat orthostatics in am - continue IVF - The patient left humerus x-ray showed no evidence for fracture or acute abnormalities.  Essential hypertension. - We will continue antihypertensives with permissive parameters.  Coronary artery disease. - hold toprol for bradycardia and for possible associated dizziness  Dyslipidemia. - continue statin  Old records reviewed in assessment of this patient  Antimicrobials:  DVT prophylaxis: enoxaparin (LOVENOX) injection 40 mg Start: 06/30/21 2200   Code Status:   Code Status: Full Code Family Communication:   Disposition Plan: Status is: Inpatient  Remains inpatient  appropriate because:Ongoing diagnostic testing needed not appropriate for outpatient work up and Inpatient level of care appropriate due to severity of illness  Dispo: The patient is from: Home              Anticipated d/c is to:  pending PT/OT evals              Patient currently is not medically stable to d/c.   Difficult to place patient No      Risk of unplanned readmission score: Unplanned Admission- Pilot do not use: 15.49   Objective: Blood pressure 139/74, pulse (!) 50, temperature 97.6 F (36.4 C), resp. rate 18, height '5\' 2"'$  (1.575 m), weight 66.3 kg, SpO2 92 %.  Examination: General appearance: alert, cooperative, fatigued, no distress, and slowed mentation Head: Normocephalic, without obvious abnormality, atraumatic Eyes:  EOMI Lungs: clear to auscultation bilaterally Heart: regular rate and rhythm and S1, S2 normal Abdomen: normal findings: bowel sounds normal and soft, non-tender Extremities:  no edema Skin: mobility and turgor normal Neurologic: no strength or sensory deficits appreciated  Consultants:  Neurology  Procedures:    Data Reviewed: I have personally reviewed following labs and imaging studies Results for orders placed or performed during the hospital encounter of 06/30/21 (from the past 24 hour(s))  Resp Panel by RT-PCR (Flu A&B, Covid) Nasopharyngeal Swab     Status: None   Collection Time: 06/30/21  3:01 PM   Specimen: Nasopharyngeal Swab; Nasopharyngeal(NP) swabs in vial transport medium  Result Value Ref Range   SARS Coronavirus 2 by RT PCR NEGATIVE NEGATIVE   Influenza A by PCR NEGATIVE NEGATIVE   Influenza B by PCR NEGATIVE NEGATIVE  Troponin I (High Sensitivity)     Status: None   Collection Time: 06/30/21  3:01 PM  Result Value Ref Range   Troponin I (High Sensitivity) 12 <18 ng/L  Urinalysis, Complete w Microscopic     Status: Abnormal   Collection Time: 06/30/21  6:15 PM  Result Value Ref Range   Color, Urine YELLOW (A) YELLOW    APPearance CLEAR (A) CLEAR   Specific Gravity, Urine 1.009 1.005 - 1.030   pH 6.0 5.0 - 8.0   Glucose, UA NEGATIVE NEGATIVE mg/dL   Hgb urine dipstick NEGATIVE NEGATIVE   Bilirubin Urine NEGATIVE NEGATIVE   Ketones, ur NEGATIVE NEGATIVE mg/dL   Protein, ur NEGATIVE NEGATIVE mg/dL   Nitrite NEGATIVE NEGATIVE   Leukocytes,Ua NEGATIVE NEGATIVE   RBC / HPF 0-5 0 - 5 RBC/hpf   WBC, UA 0-5 0 - 5 WBC/hpf   Bacteria, UA NONE SEEN NONE SEEN   Squamous Epithelial / LPF 0-5 0 - 5   Mucus PRESENT   Lipid panel     Status: Abnormal   Collection Time: 07/01/21  6:36 AM  Result Value Ref Range   Cholesterol 147 0 - 200 mg/dL   Triglycerides 207 (H) <150 mg/dL   HDL 32 (L) >40 mg/dL   Total CHOL/HDL Ratio 4.6 RATIO   VLDL 41 (H) 0 - 40 mg/dL   LDL Cholesterol 74 0 - 99 mg/dL  Basic metabolic panel     Status: Abnormal   Collection Time: 07/01/21  6:36 AM  Result Value Ref Range   Sodium 141 135 - 145 mmol/L   Potassium 3.1 (L) 3.5 - 5.1  mmol/L   Chloride 104 98 - 111 mmol/L   CO2 29 22 - 32 mmol/L   Glucose, Bld 96 70 - 99 mg/dL   BUN 13 8 - 23 mg/dL   Creatinine, Ser 0.99 0.44 - 1.00 mg/dL   Calcium 9.1 8.9 - 10.3 mg/dL   GFR, Estimated >60 >60 mL/min   Anion gap 8 5 - 15  CBC     Status: Abnormal   Collection Time: 07/01/21  6:36 AM  Result Value Ref Range   WBC 7.8 4.0 - 10.5 K/uL   RBC 3.61 (L) 3.87 - 5.11 MIL/uL   Hemoglobin 11.6 (L) 12.0 - 15.0 g/dL   HCT 33.2 (L) 36.0 - 46.0 %   MCV 92.0 80.0 - 100.0 fL   MCH 32.1 26.0 - 34.0 pg   MCHC 34.9 30.0 - 36.0 g/dL   RDW 13.4 11.5 - 15.5 %   Platelets 266 150 - 400 K/uL   nRBC 0.0 0.0 - 0.2 %    Recent Results (from the past 240 hour(s))  Resp Panel by RT-PCR (Flu A&B, Covid) Nasopharyngeal Swab     Status: None   Collection Time: 06/30/21  3:01 PM   Specimen: Nasopharyngeal Swab; Nasopharyngeal(NP) swabs in vial transport medium  Result Value Ref Range Status   SARS Coronavirus 2 by RT PCR NEGATIVE NEGATIVE Final    Comment:  (NOTE) SARS-CoV-2 target nucleic acids are NOT DETECTED.  The SARS-CoV-2 RNA is generally detectable in upper respiratory specimens during the acute phase of infection. The lowest concentration of SARS-CoV-2 viral copies this assay can detect is 138 copies/mL. A negative result does not preclude SARS-Cov-2 infection and should not be used as the sole basis for treatment or other patient management decisions. A negative result may occur with  improper specimen collection/handling, submission of specimen other than nasopharyngeal swab, presence of viral mutation(s) within the areas targeted by this assay, and inadequate number of viral copies(<138 copies/mL). A negative result must be combined with clinical observations, patient history, and epidemiological information. The expected result is Negative.  Fact Sheet for Patients:  EntrepreneurPulse.com.au  Fact Sheet for Healthcare Providers:  IncredibleEmployment.be  This test is no t yet approved or cleared by the [place] FDA and  has been authorized for detection and/or diagnosis of SARS-CoV-2 by FDA under an Emergency Use Authorization (EUA). This EUA will remain  in effect (meaning this test can be used) for the duration of the COVID-19 declaration under Section 564(b)(1) of the Act, 21 U.S.C.section 360bbb-3(b)(1), unless the authorization is terminated  or revoked sooner.       Influenza A by PCR NEGATIVE NEGATIVE Final   Influenza B by PCR NEGATIVE NEGATIVE Final    Comment: (NOTE) The Xpert Xpress SARS-CoV-2/FLU/RSV plus assay is intended as an aid in the diagnosis of influenza from Nasopharyngeal swab specimens and should not be used as a sole basis for treatment. Nasal washings and aspirates are unacceptable for Xpert Xpress SARS-CoV-2/FLU/RSV testing.  Fact Sheet for Patients: EntrepreneurPulse.com.au  Fact Sheet for Healthcare  Providers: IncredibleEmployment.be  This test is not yet approved or cleared by the [place] FDA and has been authorized for detection and/or diagnosis of SARS-CoV-2 by FDA under an Emergency Use Authorization (EUA). This EUA will remain in effect (meaning this test can be used) for the duration of the COVID-19 declaration under Section 564(b)(1) of the Act, 21 U.S.C. section 360bbb-3(b)(1), unless the authorization is terminated or revoked.  Performed at [facility], [place]  [address], [place], [place] [address]      Radiology Studies: CT HEAD WO CONTRAST  Result Date: 06/30/2021 CLINICAL DATA:  Fall yesterday with left head injury. Dizziness this morning. History of CVA. EXAM: CT HEAD WITHOUT CONTRAST TECHNIQUE: Contiguous axial images were obtained from the base of the skull through the vertex without intravenous contrast. COMPARISON:  04/10/2021 head CT. FINDINGS: Brain: Nonspecific mild to moderate subcortical and periventricular white matter hypodensity, most in keeping with chronic small vessel ischemic change. Chronic small left basal ganglia lacunar infarct. No evidence of parenchymal hemorrhage or extra-axial fluid collection. No mass lesion, mass effect, or midline shift. Previously visualized right thalamic hematoma has resolved. No CT evidence of acute infarction. Cerebral volume is age appropriate. No ventriculomegaly. Vascular: No acute abnormality. Skull: No evidence of calvarial fracture. Sinuses/Orbits: The visualized paranasal sinuses are essentially clear. Other:  The mastoid air cells are unopacified. IMPRESSION: 1. No evidence of acute intracranial abnormality. No evidence of calvarial fracture. 2. Mild-to-moderate chronic small vessel ischemic changes in the cerebral white matter. 3. Chronic small left basal ganglia lacunar infarct. Electronically Signed   By: Ilona Sorrel M.D.   On: 06/30/2021 12:35   MR BRAIN WO CONTRAST  Result Date:  06/30/2021 CLINICAL DATA:  Dizziness, persistent/recurrent, cardiac or vascular cause suspected. Fall yesterday and dizziness today. EXAM: MRI HEAD WITHOUT CONTRAST TECHNIQUE: Multiplanar, multiecho pulse sequences of the brain and surrounding structures were obtained without intravenous contrast. COMPARISON:  Head CT 06/30/2021 and MRI 04/10/2021 FINDINGS: Brain: There are several subcentimeter foci of diffusion weighted signal abnormality involving white matter of the left frontal, parietal, and occipital lobes compatible with acute to early subacute infarcts. There is encephalomalacia in the right thalamus related to the previous hemorrhage. Chronic lacunar infarcts are again noted in the left basal ganglia, left internal capsule, left thalamus, and bilateral hemispheric white matter. A lacunar infarct in the posterior right corona radiata is new from the prior MRI but is late subacute to chronic in appearance. Patchy T2 hyperintensities elsewhere in the cerebral white matter bilaterally are similar to the prior MRI and are nonspecific but compatible with severe chronic small vessel ischemic disease. A small focus of chronic hemorrhage in the left frontal lobe is unchanged from the prior MRI and is again noted to be adjacent to a curvilinear focus of susceptibility which may reflect a developmental venous anomaly and cavernoma. There is mild cerebral atrophy. No mass, midline shift, or extra-axial fluid collection is evident. Vascular: Major intracranial vascular flow voids are preserved. Skull and upper cervical spine: Unremarkable bone marrow signal. Sinuses/Orbits: Unremarkable orbits. Small volume fluid in the right sphenoid sinus. Clear mastoid air cells. Other: None. IMPRESSION: 1. Small acute to early subacute left cerebral hemispheric infarcts. 2. Late subacute to chronic right corona radiata lacunar infarct. 3. Severe chronic small vessel ischemic disease with multiple chronic lacunar infarcts as above.  Electronically Signed   By: Logan Bores M.D.   On: 06/30/2021 14:32   DG Humerus Left  Result Date: 06/30/2021 CLINICAL DATA:  Fall.  Pain.  Remote fracture. EXAM: LEFT HUMERUS - 2+ VIEW COMPARISON:  Report of left humerus radiographs 05/15/2021 at [facility] clinic. Images are not currently available. FINDINGS: Calcific tendinosis present. Shoulder novel joints are located. No acute abnormalities are present. No healing fractures are present. IMPRESSION: 1. Calcific tendinosis. 2. No acute abnormality. Electronically Signed   By: San Morelle M.D.   On: 06/30/2021 15:13   DG Humerus Left  Final Result  MR BRAIN WO CONTRAST  Final Result    CT HEAD WO CONTRAST  Final Result    CT ANGIO HEAD NECK W WO CM    (Results Pending)    Scheduled Meds:   stroke: mapping our early stages of recovery book   Does not apply Once   aspirin EC  81 mg Oral Daily   enoxaparin (LOVENOX) injection  40 mg Subcutaneous Q24H   FLUoxetine  40 mg Oral Daily   metoprolol succinate  50 mg Oral Daily   pantoprazole  40 mg Oral QHS   rosuvastatin  20 mg Oral Q2200   triamterene-hydrochlorothiazide  1 capsule Oral Daily   PRN Meds: acetaminophen **OR** acetaminophen, magnesium hydroxide, nitroGLYCERIN, ondansetron **OR** ondansetron (ZOFRAN) IV, traZODone Continuous Infusions:  sodium chloride 100 mL/hr at 06/30/21 2115     LOS: 1 day  Time spent: Greater than 50% of the 35 minute visit was spent in counseling/coordination of care for the patient as laid out in the A&P.   Dwyane Dee, MD [facility] 07/01/2021, 12:07 PM

## 2021-07-01 NOTE — Consult Note (Signed)
Neurology Consultation  Reason for Consult: stroke on imaging Referring Physician: Dr. Sidney Ace  CC: Dizziness, fall  History is obtained from: Patient, chart  HPI: [name] is a 65 y.o. female past medical history of hypertension, hyperlipidemia coronary artery disease, bilateral mild carotid stenosis, prior stroke including right thalamic ICH in June 2022 minimal residual left-sided weakness, hypertension, hyperlipidemia, another prior stroke in 2014 with no deficits, former smoker, history of migraines, came in for evaluation of dizziness followed by a fall-denies losing consciousness but did hit her head on the ground. She has full recollection of the event, did not lose consciousness, did not have any preceding aura or premonition of this happening.  Denies any palpitations or feeling of heart racing at this time or at the time this episode happened but did feel lightheaded then. No room spinning sensation, does not report any new tingling numbness or weakness.  Denies visual changes.  No headaches. Denies abdominal pain, nausea, vomiting, diarrhea constipation.  During her last presentation, which was for acute onset of left-sided weakness, she was noted to have a right thalamic ICH-likely hypertensive, was admitted to [facility] after receiving initial consultation at [facility], and discharged to SNF.  She had a fall there with fracture of her left humerus which was managed conservatively and has diminished range of motion at the left shoulder since July. She was started back on aspirin 81 after stable CT scans and reports compliance to aspirin.  Her work-up in form of the MRI showed a right thalamic ICH as well as a possible occult cavernous malformation of the left frontal lobe with associated DVA.  LDL 177-rosuvastatin was continued.  Yesterday after her arrival, she was evaluated in the ER, labs and imaging were required.  MRI of the brain showed bihemispheric  acute/subacute infarctions-small acute to early subacute left cerebral hemispheric infarcts and late subacute to chronic right corona radiata lacunar infarction along with severe chronic small vessel disease.   LKW: Sometime prior to presentation yesterday tpa given?: no, presented with dizziness and fall-nonfocal examination per the ER on presentation Premorbid modified Rankin scale (mRS): 2  ROS: Full ROS was performed and is negative except as noted in the HPI.  Past Medical History:  Diagnosis Date   Anxiety    Arthritis    CAD (coronary artery disease)    a. 03/2013 NSTEMI/Cath: 100 RCA, EF 45%->Med Rx;  b. 12/2013 Cath/PCI: LAD 36m 30d, D1 80, D2 60, LCX nl, RCA 50ost/p, 622m95d (2.5x33 Xience DES);  c. 05/2014 Lexi MV: EF 68%, no ischemia; d. stress echo 12/2015 no ischemia @ max exercise (did not achieve target)   Carotid disease, bilateral ([facility]   a. 08/2013 Carotid U/S: 1-39% bilat ICA stenosis.   Chronic kidney disease    Chronic kidney infections. Takes daily preventative.   Complication of anesthesia    CVA (cerebral vascular accident) ([facility]   a. 08/2013.   CVA (cerebral vascular accident) ([facility]   Decreased libido    Depression    GERD (gastroesophageal reflux disease)    History of 2019 novel coronavirus disease (COVID-19) 07/2019   History of recurrent UTIs    Hypercholesteremia    Hyperlipemia    Hypertension    Insomnia    Ischemic cardiomyopathy    a. 03/2013 Echo: EF 30-35%, mod dil LA, mod-sev MR, mod TR;  b. 08/2013 Echo EF 60-65%, mild AI.   Menopause    Migraines    Myocardial infarction (  [place])    PONV (postoperative nausea and vomiting)    If anesthsia is given slowly, pt will not throw up, if given quickly, pt will have nausea and vomiting.   Right lower quadrant pain    Syncope and collapse    Tobacco abuse    Vaginal atrophy     Family History  Problem Relation Age of Onset   Hypertension Mother    Stroke Mother    Hyperlipidemia Mother     Breast cancer Paternal Grandmother    Colon cancer Neg Hx    Ovarian cancer Neg Hx    Heart disease Neg Hx    Diabetes Neg Hx    Social History:   reports that she has been smoking cigarettes. She has a 30.00 pack-year smoking history. She has never used smokeless tobacco. She reports current alcohol use. She reports that she does not use drugs.  Medications  Current Facility-Administered Medications:     stroke: mapping our early stages of recovery book, , Does not apply, Once, Mansy, Jan A, MD   0.9 %  sodium chloride infusion, , Intravenous, Continuous, Mansy, Jan A, MD, Last Rate: 100 mL/hr at 06/30/21 2115, New Bag at 06/30/21 2115   acetaminophen (TYLENOL) tablet 650 mg, 650 mg, Oral, Q6H PRN **OR** acetaminophen (TYLENOL) suppository 650 mg, 650 mg, Rectal, Q6H PRN, Mansy, Jan A, MD   aspirin EC tablet 81 mg, 81 mg, Oral, Daily, Mansy, Jan A, MD, 81 mg at 06/30/21 2005   enoxaparin (LOVENOX) injection 40 mg, 40 mg, Subcutaneous, Q24H, Mansy, Jan A, MD, 40 mg at 06/30/21 2116   FLUoxetine (PROZAC) capsule 40 mg, 40 mg, Oral, Daily, Mansy, Jan A, MD, 40 mg at 06/30/21 1728   magnesium hydroxide (MILK OF MAGNESIA) suspension 30 mL, 30 mL, Oral, Daily PRN, Mansy, Jan A, MD   metoprolol succinate (TOPROL-XL) 24 hr tablet 50 mg, 50 mg, Oral, Daily, Mansy, Jan A, MD, 50 mg at 06/30/21 1728   nitroGLYCERIN (NITROSTAT) SL tablet 0.4 mg, 0.4 mg, Sublingual, Q5 min PRN, Mansy, Jan A, MD   ondansetron (ZOFRAN) tablet 4 mg, 4 mg, Oral, Q6H PRN **OR** ondansetron (ZOFRAN) injection 4 mg, 4 mg, Intravenous, Q6H PRN, Mansy, Jan A, MD   pantoprazole (PROTONIX) EC tablet 40 mg, 40 mg, Oral, QHS, Mansy, Jan A, MD, 40 mg at 06/30/21 2116   rosuvastatin (CRESTOR) tablet 20 mg, 20 mg, Oral, Q2200, Mansy, Jan A, MD, 20 mg at 06/30/21 2116   traZODone (DESYREL) tablet 25 mg, 25 mg, Oral, QHS PRN, Mansy, Arvella Merles, MD   triamterene-hydrochlorothiazide (DYAZIDE) 37.5-25 MG per capsule 1 capsule, 1 capsule, Oral,  Daily, Mansy, Jan A, MD  Current Outpatient Medications:    acetaminophen (TYLENOL) 500 MG tablet, Take 500 mg by mouth every 6 (six) hours as needed for moderate pain or mild pain., Disp: , Rfl:    FLUoxetine (PROZAC) 40 MG capsule, Take 40 mg by mouth daily., Disp: , Rfl:    furosemide (LASIX) 20 MG tablet, Take 20 mg by mouth as needed (fluid retention)., Disp: , Rfl:    metoprolol succinate (TOPROL-XL) 50 MG 24 hr tablet, Take 50 mg by mouth daily. Take with or immediately following a meal., Disp: , Rfl:    nitroGLYCERIN (NITROSTAT) 0.4 MG SL tablet, Place 0.4 mg under the tongue every 5 (five) minutes as needed for chest pain., Disp: , Rfl:    pantoprazole (PROTONIX) 40 MG tablet, Take 1 tablet (40 mg total) by mouth at bedtime., Disp: ,  Rfl:    rosuvastatin (CRESTOR) 20 MG tablet, Take 20 mg by mouth daily., Disp: , Rfl:    triamterene-hydrochlorothiazide (DYAZIDE) 37.5-25 MG capsule, Take 1 capsule by mouth daily., Disp: , Rfl:    amLODipine (NORVASC) 10 MG tablet, Take 1 tablet (10 mg total) by mouth daily. (Patient not taking: No sig reported), Disp: , Rfl:    Exam: Current vital signs: BP 139/74   Pulse (!) 50   Temp 97.6 F (36.4 C)   Resp 18   Ht '5\' 2"'$  (1.575 m)   Wt 66.3 kg   SpO2 92%   BMI 26.73 kg/m  Vital signs in last 24 hours: Temp:  [97.6 F (36.4 C)-98.3 F (36.8 C)] 97.6 F (36.4 C) (08/23 0700) Pulse Rate:  [50-94] 50 (08/23 0700) Resp:  [16-19] 18 (08/23 0700) BP: (106-139)/(63-77) 139/74 (08/23 0900) SpO2:  [91 %-98 %] 92 % (08/23 0700) Weight:  [66.3 kg] 66.3 kg (08/22 1140) General: Awake alert in no distress HEENT: Normocephalic, mild bruising on left temple along with a very small superficial skin injury CVS regular rate rhythm Respiratory: Clear Abdomen nondistended nontender Extremities warm well perfused with intact pulses Neurological examination awake alert oriented x3 No dysarthria No aphasia Cranial nerves II to XII intact Motor exam  with very subtle left upper extremity weakness-more pronounced weakness of the left shoulder due to pain and possible fracture malunion-left upper extremity also has diminished range of motion.  Left leg is without drift right upper and lower extremity full strength without drift Sensation intact Coordination with no dysmetria Gait testing deferred NIH stroke scale 1a Level of Conscious.: 0 1b LOC Questions: 0 1c LOC Commands: 0 2 Best Gaze: 0 3 Visual: 0 4 Facial Palsy: 0 5a Motor Arm - left: 0 5b Motor Arm - Right: 0 6a Motor Leg - Left: 0 6b Motor Leg - Right: 0 7 Limb Ataxia: 0 8 Sensory: 0 9 Best Language: 0 10 Dysarthria: 0 11 Extinct. and Inatten.: 0 TOTAL: 0  Labs I have reviewed labs in epic and the results pertinent to this consultation are:   CBC    Component Value Date/Time   WBC 7.8 07/01/2021 0636   RBC 3.61 (L) 07/01/2021 0636   HGB 11.6 (L) 07/01/2021 0636   HGB 12.4 08/07/2016 1435   HCT 33.2 (L) 07/01/2021 0636   HCT 36.3 08/07/2016 1435   PLT 266 07/01/2021 0636   PLT 345 08/07/2016 1435   MCV 92.0 07/01/2021 0636   MCV 91 08/07/2016 1435   MCV 94 02/17/2015 2238   MCH 32.1 07/01/2021 0636   MCHC 34.9 07/01/2021 0636   RDW 13.4 07/01/2021 0636   RDW 13.4 08/07/2016 1435   RDW 13.8 02/17/2015 2238   LYMPHSABS 2.2 04/09/2021 2218   LYMPHSABS 2.0 08/07/2016 1435   LYMPHSABS 2.3 02/17/2015 2238   MONOABS 1.0 04/09/2021 2218   MONOABS 1.0 (H) 02/17/2015 2238   EOSABS 0.3 04/09/2021 2218   EOSABS 0.3 08/07/2016 1435   EOSABS 0.4 02/17/2015 2238   BASOSABS 0.1 04/09/2021 2218   BASOSABS 0.0 08/07/2016 1435   BASOSABS 0.1 02/17/2015 2238    CMP     Component Value Date/Time   NA 141 07/01/2021 0636   NA 142 08/07/2016 1435   NA 138 02/17/2015 2238   K 3.1 (L) 07/01/2021 0636   K 2.8 (L) 02/17/2015 2238   CL 104 07/01/2021 0636   CL 101 02/17/2015 2238   CO2 29 07/01/2021 0636   CO2 27  02/17/2015 2238   GLUCOSE 96 07/01/2021 0636    GLUCOSE 120 (H) 02/17/2015 2238   BUN 13 07/01/2021 0636   BUN 9 08/07/2016 1435   BUN 22 (H) 02/17/2015 2238   CREATININE 0.99 07/01/2021 0636   CREATININE 0.85 02/17/2015 2238   CALCIUM 9.1 07/01/2021 0636   CALCIUM 9.3 02/17/2015 2238   PROT 6.5 04/10/2021 2109   PROT 7.1 02/17/2015 2238   ALBUMIN 3.4 (L) 04/10/2021 2109   ALBUMIN 4.0 02/17/2015 2238   AST 26 04/10/2021 2109   AST 22 02/17/2015 2238   ALT 19 04/10/2021 2109   ALT 23 02/17/2015 2238   ALKPHOS 82 04/10/2021 2109   ALKPHOS 79 02/17/2015 2238   BILITOT 1.2 04/10/2021 2109   BILITOT 0.4 02/17/2015 2238   GFRNONAA >60 07/01/2021 0636   GFRNONAA >60 02/17/2015 2238   GFRAA >60 12/27/2017 1140   GFRAA >60 02/17/2015 2238    Lipid Panel     Component Value Date/Time   CHOL 147 07/01/2021 0636   CHOL 192 05/15/2014 0500   TRIG 207 (H) 07/01/2021 0636   TRIG 155 05/15/2014 0500   HDL 32 (L) 07/01/2021 0636   HDL 51 05/15/2014 0500   CHOLHDL 4.6 07/01/2021 0636   VLDL 41 (H) 07/01/2021 0636   VLDL 31 05/15/2014 0500   LDLCALC 74 07/01/2021 0636   LDLCALC 110 (H) 05/15/2014 0500   LDL 74 A1c-pending  Imaging I have reviewed the images obtained: MRI examination of the brain-small acute to early subacute left cerebral hemispheric infarcts along with late subacute/chronic right corona radiata lacunar infarct and several other chronic small lacunar infarctions and small vessel disease.  Assessment:  65 year old with above past medical history including a right thalamic ICH with minimal left-sided weakness as residual deficits and prior strokes, migraines, CAD, hypertension, hyperlipidemia presented for evaluation of a fall that she sustained after having an episode of dizziness and work-up in the form of MRI revealed different ages of bihemispheric infarctions.  On examination, she did not have any focal findings-either for the EDP yesterday, admitting hospitalist yesterday or for me today. Her recent right  thalamic intracerebral hemorrhage was June of this year-likely hypertensive in etiology.   She was started on an aspirin, after waiting initially for a while, given the prior history of stroke but this particular distribution of her strokes is more concerning for a central embolic source-likely cardioembolic.  Impression: Acute ischemic stroke-appearance cardioembolic appearing due to involvement of multiple vascular territories Prior history of ICH Hypertension Hyperlipidemia Coronary artery disease   Recommendations: -Frequent neurochecks -Continue telemetry -She might need a TEE/loop recorder or at least a 30-day monitor first due to high index of suspicion for an underlying abnormal heart rhythm such as atrial fibrillation. In light of her recent right thalamic ICH, the decision for anticoagulation would also be tricky if she has atrial fibrillation. -CTA head and neck -Repeat 2D echo -LDL at 74-much improved from few months ago-continue statin -A1c pending -PT -OT -Speech therapy -Continue aspirin for now -Allow another day or so permissive hypertension and then start gradually normalizing blood pressures to goal at discharge less than 140/90  Plan discussed with Dr. Sabino Gasser  -- Amie Portland, MD Neurologist [facility] Pager: [phone]

## 2021-07-01 NOTE — Evaluation (Signed)
Physical Therapy Evaluation Patient Details Name: [name] MRN: [id number] DOB: 1956-04-24 Today's Date: 07/01/2021   History of Present Illness  Per MD notes, pt is a 65 y.o. Caucasian female with medical history significant for coronary artery disease, anxiety, peripheral vascular disease, depression, hypertension, dyslipidemia, ischemic cardiomyopathy, migraine and hemorrhagic CVA with right thalamic hemorrhage on 6/1 with associated left-sided weakness, who [facility] and then went home on 7/2.  2 days prior to going home she fell at the SNF and sustained left proximal humerus fracture that was conservatively managed.  She presented to the ER with acute onset of dizziness and admitted to having the syncope and head injury yesterday in the left temporal area.  MD assessment includes acute CVA, dizziness, and syncope and fall with left upper extremity pain.  Clinical Impression  Pt was pleasant and motivated to participate during the session. Pt performed all functional mobility with supervision- min A. Pt demonstrated some unsteadiness walking without AD which improved when walking with RW. Pt reported dizziness during walk without which attributed to LOB during walk without AD but vitals were stable throughout session. Pt is limited with OOB activity secondary to decreased balance. Pt will benefit from OPPT upon discharge to safely address deficits listed in patient problem list for decreased caregiver assistance and eventual return to PLOF.      Follow Up Recommendations Outpatient PT;Supervision for mobility/OOB    Equipment Recommendations  Rolling walker with 5" wheels    Recommendations for Other Services       Precautions / Restrictions Precautions Precautions: Fall Restrictions Weight Bearing Restrictions: No      Mobility  Bed Mobility Overal bed mobility: Needs Assistance Bed Mobility: Supine to Sit;Sit to Supine     Supine to sit:  Supervision Sit to supine: Supervision   General bed mobility comments: increased time/effort    Transfers Overall transfer level: Needs assistance Equipment used: None;Rolling walker (2 wheeled) Transfers: Sit to/from Stand Sit to Stand: Min guard;From elevated surface         General transfer comment: Verbal cues for sequencing and walker placement. Good concentric/eccentric control.  Ambulation/Gait Ambulation/Gait assistance: Min guard;Min assist Gait Distance (Feet): 120 Feet x1 w/out AD; 158f x1 w/RW Assistive device: Rolling walker (2 wheeled);None Gait Pattern/deviations: Step-through pattern;Decreased stride length;Drifts right/left Gait velocity: decreased   General Gait Details: Pt walked without AD slowly and overall steady until she began dynamic activites turning/nodding head which resulted in 2 LOB requiring min A to correct secondary to reported dizziness. Pt walked with RW for 2nd walk and was overall steady exhibiting increased speed and no LOB.  Stairs            Wheelchair Mobility    Modified Rankin (Stroke Patients Only)       Balance Overall balance assessment: Needs assistance Sitting-balance support: Feet unsupported;No upper extremity supported Sitting balance-Leahy Scale: Good Sitting balance - Comments: Supervision at EOB   Standing balance support: No upper extremity supported;During functional activity Standing balance-Leahy Scale: Fair Standing balance comment: Fair balance with dynamic activites demonstrating 2 LOB but overall steady with AD                             Pertinent Vitals/Pain Pain Assessment: 0-10 Pain Score: 4  Pain Location: LUE Pain Descriptors / Indicators: Guarding;Aching Pain Intervention(s): Monitored during session    Home Living Family/patient expects to be discharged to:: Private residence Living  Arrangements: Alone Available Help at Discharge: Available  PRN/intermittently;Friend(s) Type of Home: Apartment Home Access: Level entry     Home Layout: One level Home Equipment: Walker - 2 wheels;Bedside commode;Shower seat;Grab bars - tub/shower;Grab bars - toilet;Cane - single point Additional Comments: Walker too big for pt; access to friend [facility]    Prior Function Level of Independence: Needs assistance   Gait / Transfers Assistance Needed: Pt reports using no AD at home and sometimes uses a SPC. Uses grocery cart or holds on to friend for community distances.  ADL's / Homemaking Assistance Needed: Pt reports since home from SNF, [name] friend assists with transportation otherwise pt indep with basic ADL, meds, meals, and was ambulating around home daily  Comments: Reports 3 falls total in last 6 months     Hand Dominance   Dominant Hand: Right    Extremity/Trunk Assessment   Upper Extremity Assessment Upper Extremity Assessment: Defer to OT evaluation LUE Deficits / Details: recent humeral fx (managing conservatively per chart and pt report); pt is able to perform partial shoulder ROM without pain, able to use LUE for ADL tasks including feeding, clothing mgt during toileting, and LB dressing LUE: Unable to fully assess due to pain LUE Coordination: decreased gross motor    Lower Extremity Assessment Lower Extremity Assessment: RLE deficits/detail;LLE deficits/detail RLE Deficits / Details: Strength 4/5 grossly; Proprioception intact RLE Sensation: WNL LLE Deficits / Details: Strength 4/5 grossly; Proprioception intact LLE Sensation: decreased light touch    Cervical / Trunk Assessment Cervical / Trunk Assessment: Normal  Communication   Communication: No difficulties  Cognition Arousal/Alertness: Awake/alert Behavior During Therapy: WFL for tasks assessed/performed Overall Cognitive Status: Within Functional Limits for tasks assessed                                 General Comments: slow processing, follows  commands, alert and oriented x4 (got them correct but was a bit unsure of her responses)      General Comments General comments (skin integrity, edema, etc.): RN notified of orthostatic vitals (she documented in chart): supine 115/75, sitting EOB 111/61, standing 733mn 106/68, standing >559m 119/77; Pt endorsed mild dizziness upon sitting EOB that resolved ebfore standing    Exercises Total Joint Exercises Marching in Standing: AROM;Strengthening;Both;10 reps;Standing Other Exercises Other Exercises: Static standing 1-33m58mand standing with dynamic functional activity 1-2 with min guard for improved activity tolerance.   Assessment/Plan    PT Assessment Patient needs continued PT services  PT Problem List Decreased strength;Decreased activity tolerance;Decreased balance;Decreased mobility;Decreased knowledge of use of DME;Decreased safety awareness;Decreased knowledge of precautions;Pain;Impaired sensation       PT Treatment Interventions DME instruction;Gait training;Stair training;Functional mobility training;Therapeutic activities;Therapeutic exercise;Balance training;Patient/family education    PT Goals (Current goals can be found in the Care Plan section)  Acute Rehab PT Goals Patient Stated Goal: Walk without falling down PT Goal Formulation: With patient Time For Goal Achievement: 07/14/21 Potential to Achieve Goals: Good    Frequency Min 2X/week   Barriers to discharge        Co-evaluation               AM-PAC PT "6 Clicks" Mobility  Outcome Measure Help needed turning from your back to your side while in a flat bed without using bedrails?: None Help needed moving from lying on your back to sitting on the side of a flat bed without using bedrails?: A Little Help  needed moving to and from a bed to a chair (including a wheelchair)?: A Little Help needed standing up from a chair using your arms (e.g., wheelchair or bedside chair)?: A Little Help needed to walk in  hospital room?: A Little Help needed climbing 3-5 steps with a railing? : A Little 6 Click Score: 19    End of Session Equipment Utilized During Treatment: Gait belt Activity Tolerance: Patient tolerated treatment well Patient left: in bed;with call bell/phone within reach;with family/visitor present Nurse Communication: Mobility status PT Visit Diagnosis: Unsteadiness on feet (R26.81);Difficulty in walking, not elsewhere classified (R26.2);Muscle weakness (generalized) (M62.81);Repeated falls (R29.6);Pain Pain - Right/Left: Left Pain - part of body: Shoulder    Time: [id number] PT Time Calculation (min) (ACUTE ONLY): 44 min   Charges:              Dayton Scrape SPT 07/01/21, 3:52 PM

## 2021-07-01 NOTE — Progress Notes (Signed)
SLP Cancellation Note  Patient Details Name: [name] MRN: [id number] DOB: 1956/01/12   Cancelled treatment:       Reason Eval/Treat Not Completed: SLP screened, no needs identified, will sign off (chart reviewed; consulted NSG then met w/ pt/family). Pt denied any difficulty swallowing and is currently on a regular diet; tolerates swallowing pills w/ water per NSG. Pt conversed in general conversation w/out expressive/receptive deficits noted; pt and family member denied any speech-language deficits. Speech clear. Pt was texting/reading phone texts w/ no difficulty reported.  No further skilled ST services indicated as pt appears at her baseline. Pt agreed. NSG to reconsult if any change in status while admitted.       Orinda Kenner, MS, CCC-SLP Speech Language Pathologist Rehab Services [phone] [facility] 07/01/2021, 12:07 PM

## 2021-07-01 NOTE — Evaluation (Signed)
Occupational Therapy Evaluation Patient Details Name: [name] MRN: [id number] DOB: 11-21-1955 Today's Date: 07/01/2021    History of Present Illness 65yo female with PMHx of CAD s/p stenting, hx MI 08/2013, anxiety, PVD, depression, HTN, dislipidemia, ischemic cardiomyopathy, migraines, R BG infarct 08/2013, and hemorrhagic CVA 04/09/21 with associated L-sided weakness. Pt discharged to SNF and then went home on 05/10/21.  2 days prior to going home she fell at SNF and sustained left proximal humerus fracture that was conservatively managed.  She received physical therapy there and her left-sided weakness significant improved.  She presents to ED on 06/30/21 with acute onset of dizziness and admitted to having the syncope and head injury yesterday in the left temporal area. Brain MRI reveals small acute to early subacute left cerebral hemispheric infarcts, late subacute to chronic right corona radiata lacunar infarct, and severe chronic small vessel ischemic disease with multiple chronic lacunar infarcts.   Clinical Impression   Pt was seen for OT evaluation this date. Prior to hospital admission, pt was at home by herself with PRN assist from friend [name] for transportation. Pt reports ambulating without AD and no difficulty performing basic ADL, light meal prep, and med mgt herself. Currently pt demonstrates impairments in strength, balance, activity tolerance, LUE functional use (recent humeral fracture being managed conservatively per chart/pt) as described below (See OT problem list) which functionally limit her ability to perform ADL/self-care tasks. Pt currently requires supervision to [place] for ADL transfers, remote supervision for toileting including clothing mgt, transfer, and hygiene while using lateral lean from std height toilet, and supervision for seated LB ADL.  Pt would benefit from skilled OT services to address noted impairments and functional limitations (see below for any additional  details) in order to maximize safety and independence while minimizing falls risk and caregiver burden. Upon hospital discharge, recommend HHOT to maximize pt safety and return to functional independence during meaningful occupations of daily life.      Follow Up Recommendations  Home health OT;Supervision - Intermittent    Equipment Recommendations  None recommended by OT    Recommendations for Other Services       Precautions / Restrictions Precautions Precautions: Fall Restrictions Weight Bearing Restrictions: No      Mobility Bed Mobility Overal bed mobility: Needs Assistance Bed Mobility: Supine to Sit;Sit to Supine     Supine to sit: Supervision Sit to supine: Supervision   General bed mobility comments: increased time/effort, limited LUE use    Transfers Overall transfer level: Needs assistance Equipment used: None Transfers: Sit to/from Stand Sit to Stand: Supervision;Min guard              Balance Overall balance assessment: Needs assistance Sitting-balance support: No upper extremity supported;Feet supported Sitting balance-Leahy Scale: Good     Standing balance support: No upper extremity supported;During functional activity Standing balance-Leahy Scale: Fair                             ADL either performed or assessed with clinical judgement   ADL Overall ADL's : Needs assistance/impaired                                       General ADL Comments: Supervision for LB ADL while seated, supervision for toileting from std height toilet, S-CGA for ADL transfers     Vision Baseline Vision/History:  1 Wears glasses Patient Visual Report: No change from baseline       Perception     Praxis      Pertinent Vitals/Pain Pain Assessment: 0-10 Pain Score: 4  Pain Location: LUE Pain Descriptors / Indicators: Guarding;Aching Pain Intervention(s): Limited activity within patient's tolerance;Monitored during  session;Repositioned     Hand Dominance Right   Extremity/Trunk Assessment Upper Extremity Assessment Upper Extremity Assessment: Generalized weakness;LUE deficits/detail LUE Deficits / Details: recent humeral fx (managing conservatively per chart and pt report); pt is able to perform partial shoulder ROM without pain, able to use LUE for ADL tasks including feeding, clothing mgt during toileting, and LB dressing LUE: Unable to fully assess due to pain LUE Coordination: decreased gross motor   Lower Extremity Assessment Lower Extremity Assessment: Generalized weakness;Defer to PT evaluation (pt reports LLE weaker than RLE chronically)       Communication Communication Communication: No difficulties   Cognition Arousal/Alertness: Awake/alert Behavior During Therapy: WFL for tasks assessed/performed Overall Cognitive Status: No family/caregiver present to determine baseline cognitive functioning                                 General Comments: slow processing, follows commands, alert and oriented x4 (got them correct but was a bit unsure of her responses)   General Comments  RN notified of orthostatic vitals (she documented in chart): supine 115/75, sitting EOB 111/61, standing 59mn 106/68, standing >547m 119/77; Pt endorsed mild dizziness upon sitting EOB that resolved ebfore standing    Exercises     Shoulder Instructions      Home Living Family/patient expects to be discharged to:: Private residence Living Arrangements: Alone Available Help at Discharge: Available PRN/intermittently;Friend(s) (friend CaAsencion PartridgeType of Home: Apartment Home Access: Level entry     Home Layout: One level     Bathroom Shower/Tub: TuTeacher, early years/preStandard                Prior Functioning/Environment Level of Independence: Needs assistance  Gait / Transfers Assistance Needed: pt reports since home from SNF, no AD for mobility, 1 fall in kitchen/[id number] of details) ADL's / Homemaking Assistance Needed: Pt reports since home from SNF, CarAsencion Partridgeiend assists with transportation otherwise pt indep with basic ADL, meds, meals, and was ambulating around home daily            OT Problem List: Decreased strength;Pain;Decreased range of motion;Decreased activity tolerance;Impaired balance (sitting and/or standing);Decreased knowledge of use of DME or AE;Impaired UE functional use      OT Treatment/Interventions: Self-care/ADL training;Therapeutic exercise;Therapeutic activities;Neuromuscular education;Energy conservation;DME and/or AE instruction;Patient/family education;Balance training    OT Goals(Current goals can be found in the care plan section) Acute Rehab OT Goals Patient Stated Goal: go home and keep doing therapy OT Goal Formulation: With patient Time For Goal Achievement: 07/15/21 Potential to Achieve Goals: Good ADL Goals Pt Will Transfer to Toilet: with modified independence;ambulating Additional ADL Goal #1: Pt will verbalize plan to implement at least 2 learned falls prevention/safety strategies to support ADL/IADL participation and minimize risk of falls.  OT Frequency: Min 2X/week   Barriers to D/C:            Co-evaluation              AM-PAC OT "6 Clicks" Daily Activity     Outcome Measure Help from another person eating meals?: None Help from another person  taking care of personal grooming?: A Little Help from another person toileting, which includes using toliet, bedpan, or urinal?: A Little Help from another person bathing (including washing, rinsing, drying)?: A Little Help from another person to put on and taking off regular upper body clothing?: None Help from another person to put on and taking off regular lower body clothing?: A Little 6 Click Score: 20   End of Session Equipment Utilized During Treatment: Gait belt Nurse Communication: Mobility status;Other (comment) (orthostatics)  Activity  Tolerance: Patient tolerated treatment well Patient left: in bed;with call bell/phone within reach;with bed alarm set  OT Visit Diagnosis: Other abnormalities of gait and mobility (R26.89);Muscle weakness (generalized) (M62.81);History of falling (Z91.81);Pain Pain - Right/Left: Left Pain - part of body: Arm                Time: [id number] OT Time Calculation (min): 28 min Charges:  OT General Charges $OT Visit: 1 Visit OT Evaluation $OT Eval Moderate Complexity: 1 Mod OT Treatments $Self Care/Home Management : 8-22 mins  Hanley Hays, MPH, MS, OTR/L ascom [phone] 07/01/21, 12:02 PM

## 2021-07-02 ENCOUNTER — Other Ambulatory Visit: Payer: Self-pay | Admitting: Physician Assistant

## 2021-07-02 ENCOUNTER — Inpatient Hospital Stay (HOSPITAL_COMMUNITY)
Admit: 2021-07-02 | Discharge: 2021-07-02 | Disposition: A | Discharge status: Home / Self Care | Payer: PPO | Attending: Physician Assistant | Admitting: Physician Assistant

## 2021-07-02 DIAGNOSIS — I6300 Cerebral infarction due to thrombosis of unspecified precerebral artery: Secondary | ICD-10-CM | POA: Diagnosis not present

## 2021-07-02 DIAGNOSIS — E876 Hypokalemia: Secondary | ICD-10-CM | POA: Diagnosis not present

## 2021-07-02 DIAGNOSIS — I639 Cerebral infarction, unspecified: Secondary | ICD-10-CM

## 2021-07-02 DIAGNOSIS — I6319 Cerebral infarction due to embolism of other precerebral artery: Secondary | ICD-10-CM

## 2021-07-02 DIAGNOSIS — I10 Essential (primary) hypertension: Secondary | ICD-10-CM | POA: Diagnosis not present

## 2021-07-02 LAB — CBC WITH DIFFERENTIAL/PLATELET
Abs Immature Granulocytes: 0.02 10*3/uL (ref 0.00–0.07)
Basophils Absolute: 0.1 10*3/uL (ref 0.0–0.1)
Basophils Relative: 1 %
Eosinophils Absolute: 0.4 10*3/uL (ref 0.0–0.5)
Eosinophils Relative: 5 %
HCT: 32.9 % — ABNORMAL LOW (ref 36.0–46.0)
Hemoglobin: 11.4 g/dL — ABNORMAL LOW (ref 12.0–15.0)
Immature Granulocytes: 0 %
Lymphocytes Relative: 29 %
Lymphs Abs: 2.3 10*3/uL (ref 0.7–4.0)
MCH: 31.8 pg (ref 26.0–34.0)
MCHC: 34.7 g/dL (ref 30.0–36.0)
MCV: 91.9 fL (ref 80.0–100.0)
Monocytes Absolute: 0.8 10*3/uL (ref 0.1–1.0)
Monocytes Relative: 10 %
Neutro Abs: 4.5 10*3/uL (ref 1.7–7.7)
Neutrophils Relative %: 55 %
Platelets: 271 10*3/uL (ref 150–400)
RBC: 3.58 MIL/uL — ABNORMAL LOW (ref 3.87–5.11)
RDW: 13.5 % (ref 11.5–15.5)
WBC: 8 10*3/uL (ref 4.0–10.5)
nRBC: 0 % (ref 0.0–0.2)

## 2021-07-02 LAB — BASIC METABOLIC PANEL
Anion gap: 10 (ref 5–15)
BUN: 10 mg/dL (ref 8–23)
CO2: 25 mmol/L (ref 22–32)
Calcium: 8.9 mg/dL (ref 8.9–10.3)
Chloride: 107 mmol/L (ref 98–111)
Creatinine, Ser: 1.02 mg/dL — ABNORMAL HIGH (ref 0.44–1.00)
GFR, Estimated: >60 mL/min (ref >60)
Glucose, Bld: 96 mg/dL (ref 70–99)
Potassium: 3 mmol/L — ABNORMAL LOW (ref 3.5–5.1)
Sodium: 142 mmol/L (ref 135–145)

## 2021-07-02 LAB — MAGNESIUM: Magnesium: 2.2 mg/dL (ref 1.7–2.4)

## 2021-07-02 MED ORDER — ASPIRIN 81 MG PO TBEC: 81.0000 mg | DELAYED_RELEASE_TABLET | Freq: Every day | ORAL | 0 refills | Status: AC

## 2021-07-02 MED ORDER — POTASSIUM CHLORIDE 20 MEQ PO PACK
40.0000 meq | PACK | ORAL | Status: DC
Start: 2021-07-02 — End: 2021-07-02
  Filled 2021-07-02: qty 2

## 2021-07-02 MED ORDER — POTASSIUM CHLORIDE CRYS ER 20 MEQ PO TBCR
40.0000 meq | EXTENDED_RELEASE_TABLET | ORAL | Status: AC
Start: 2021-07-02 — End: 2021-07-02
  Administered 2021-07-02 (×2): 40 meq via ORAL
  Filled 2021-07-02: qty 2

## 2021-07-02 MED ORDER — POTASSIUM CHLORIDE 10 MEQ/100ML IV SOLN
10.0000 meq | INTRAVENOUS | Status: DC
Start: 2021-07-02 — End: 2021-07-02
  Administered 2021-07-02 (×2): 10 meq via INTRAVENOUS
  Filled 2021-07-02 (×4): qty 100

## 2021-07-02 NOTE — Progress Notes (Signed)
Neurology Progress Note   S:// Seen and examined.  No acute issues.    O:// Current vital signs: BP (!) 142/79 (BP Location: Right Arm)   Pulse (!) 53   Temp 97.7 F (36.5 C)   Resp 14   Ht '5\' 2"'$  (1.575 m)   Wt 62.2 kg   SpO2 98%   BMI 25.08 kg/m  Vital signs in last 24 hours: Temp:  [97.7 F (36.5 C)-98.2 F (36.8 C)] 97.7 F (36.5 C) (08/24 0740) Pulse Rate:  [50-60] 53 (08/24 0740) Resp:  [14-20] 14 (08/24 0740) BP: (114-142)/(55-79) 142/79 (08/24 0740) SpO2:  [95 %-98 %] 98 % (08/24 0740) Weight:  [62.2 kg] 62.2 kg (08/23 1925)  General: Awake alert in no distress HEENT: Normocephalic, mild bruising on left temple along with a very small superficial skin injury CVS regular rate rhythm Respiratory: Clear Abdomen nondistended nontender Extremities warm well perfused with intact pulses Neurological examination awake alert oriented x3 No dysarthria No aphasia Cranial nerves II to XII intact Motor exam with very subtle left upper extremity weakness-more pronounced weakness of the left shoulder due to pain and possible fracture malunion-left upper extremity also has diminished range of motion.  Left leg is without drift right upper and lower extremity full strength without drift Sensation intact Coordination with no dysmetria Gait testing deferred Exam remains unchanged   Medications  Current Facility-Administered Medications:     stroke: mapping our early stages of recovery book, , Does not apply, Once, Mansy, Jan A, MD   0.9 %  sodium chloride infusion, , Intravenous, Continuous, Mansy, Jan A, MD, Last Rate: 100 mL/hr at 07/02/21 0826, New Bag at 07/02/21 [id number]   acetaminophen (TYLENOL) tablet 650 mg, 650 mg, Oral, Q6H PRN **OR** acetaminophen (TYLENOL) suppository 650 mg, 650 mg, Rectal, Q6H PRN, Mansy, Jan A, MD   aspirin EC tablet 81 mg, 81 mg, Oral, Daily, Mansy, Jan A, MD, 81 mg at 07/02/21 0826   enoxaparin (LOVENOX) injection 40 mg, 40 mg, Subcutaneous,  Q24H, Mansy, Jan A, MD, 40 mg at 07/01/21 2204   FLUoxetine (PROZAC) capsule 40 mg, 40 mg, Oral, Daily, Mansy, Jan A, MD, 40 mg at 07/02/21 [id number]   magnesium hydroxide (MILK OF MAGNESIA) suspension 30 mL, 30 mL, Oral, Daily PRN, Mansy, Jan A, MD   nitroGLYCERIN (NITROSTAT) SL tablet 0.4 mg, 0.4 mg, Sublingual, Q5 min PRN, Mansy, Jan A, MD   ondansetron (ZOFRAN) tablet 4 mg, 4 mg, Oral, Q6H PRN **OR** ondansetron (ZOFRAN) injection 4 mg, 4 mg, Intravenous, Q6H PRN, Mansy, Jan A, MD   pantoprazole (PROTONIX) EC tablet 40 mg, 40 mg, Oral, QHS, Mansy, Jan A, MD, 40 mg at 07/01/21 2203   potassium chloride 10 mEq in 100 mL IVPB, 10 mEq, Intravenous, Q1 Hr x 4, Zhang, Dekui, MD, Last Rate: 100 mL/hr at 07/02/21 0827, 10 mEq at 07/02/21 0827   rosuvastatin (CRESTOR) tablet 20 mg, 20 mg, Oral, Q2200, Mansy, Jan A, MD, 20 mg at 07/01/21 2203   traZODone (DESYREL) tablet 25 mg, 25 mg, Oral, QHS PRN, Mansy, Jan A, MD, 25 mg at 07/01/21 2208 Labs CBC    Component Value Date/Time   WBC 8.0 07/02/2021 0510   RBC 3.58 (L) 07/02/2021 0510   HGB 11.4 (L) 07/02/2021 0510   HGB 12.4 08/07/2016 1435   HCT 32.9 (L) 07/02/2021 0510   HCT 36.3 08/07/2016 1435   PLT 271 07/02/2021 0510   PLT 345 08/07/2016 1435   MCV 91.9 07/02/2021 0510  MCV 91 08/07/2016 1435   MCV 94 02/17/2015 2238   MCH 31.8 07/02/2021 0510   MCHC 34.7 07/02/2021 0510   RDW 13.5 07/02/2021 0510   RDW 13.4 08/07/2016 1435   RDW 13.8 02/17/2015 2238   LYMPHSABS 2.3 07/02/2021 0510   LYMPHSABS 2.0 08/07/2016 1435   LYMPHSABS 2.3 02/17/2015 2238   MONOABS 0.8 07/02/2021 0510   MONOABS 1.0 (H) 02/17/2015 2238   EOSABS 0.4 07/02/2021 0510   EOSABS 0.3 08/07/2016 1435   EOSABS 0.4 02/17/2015 2238   BASOSABS 0.1 07/02/2021 0510   BASOSABS 0.0 08/07/2016 1435   BASOSABS 0.1 02/17/2015 2238    CMP     Component Value Date/Time   NA 142 07/02/2021 0510   NA 142 08/07/2016 1435   NA 138 02/17/2015 2238   K 3.0 (L) 07/02/2021 0510    K 2.8 (L) 02/17/2015 2238   CL 107 07/02/2021 0510   CL 101 02/17/2015 2238   CO2 25 07/02/2021 0510   CO2 27 02/17/2015 2238   GLUCOSE 96 07/02/2021 0510   GLUCOSE 120 (H) 02/17/2015 2238   BUN 10 07/02/2021 0510   BUN 9 08/07/2016 1435   BUN 22 (H) 02/17/2015 2238   CREATININE 1.02 (H) 07/02/2021 0510   CREATININE 0.85 02/17/2015 2238   CALCIUM 8.9 07/02/2021 0510   CALCIUM 9.3 02/17/2015 2238   PROT 6.5 04/10/2021 2109   PROT 7.1 02/17/2015 2238   ALBUMIN 3.4 (L) 04/10/2021 2109   ALBUMIN 4.0 02/17/2015 2238   AST 26 04/10/2021 2109   AST 22 02/17/2015 2238   ALT 19 04/10/2021 2109   ALT 23 02/17/2015 2238   ALKPHOS 82 04/10/2021 2109   ALKPHOS 79 02/17/2015 2238   BILITOT 1.2 04/10/2021 2109   BILITOT 0.4 02/17/2015 2238   GFRNONAA >60 07/02/2021 0510   GFRNONAA >60 02/17/2015 2238   GFRAA >60 12/27/2017 1140   GFRAA >60 02/17/2015 2238    glycosylated hemoglobin-6.2  Lipid Panel     Component Value Date/Time   CHOL 147 07/01/2021 0636   CHOL 192 05/15/2014 0500   TRIG 207 (H) 07/01/2021 0636   TRIG 155 05/15/2014 0500   HDL 32 (L) 07/01/2021 0636   HDL 51 05/15/2014 0500   CHOLHDL 4.6 07/01/2021 0636   VLDL 41 (H) 07/01/2021 0636   VLDL 31 05/15/2014 0500   LDLCALC 74 07/01/2021 0636   LDLCALC 110 (H) 05/15/2014 0500     Imaging I have reviewed images in epic and the results pertinent to this consultation are: MRI examination of the brain-small acute to early subacute left cerebral hemispheric infarcts along with late subacute/chronic right corona radiata lacunar infarct and several other chronic small lacunar infarctions and small vessel disease.  CT angiography head and neck: CTA NECK Aortic arch: Great vessel origins are patent. Right carotid system: Mild eccentric plaque along the distal common carotid minor stenosis. Mild calcified plaque along the proximal internal carotid with minor stenosis. Left carotid system: Patent. Mild calcified plaque  along the distal common carotid with minor stenosis. Minimal calcified plaque along the proximal internal carotid without stenosis. Vertebral arteries: Patent. Left vertebral artery is dominant. Minimal eccentric noncalcified plaque along the distal right V3 segment. No stenosis. Skeleton: Mild cervical spine degenerative changes. Other neck: Unremarkable. Upper chest: No apical lung mass. Review of the MIP images confirms the above findings CTA HEAD Anterior circulation: Intracranial internal carotid arteries are patent with minimal calcified plaque. Anterior and middle cerebral arteries are patent. Mild right M2 branch  stenosis. Posterior circulation: Intracranial vertebral arteries are patent. There is moderate stenosis of the distal right vertebral artery. Basilar artery is patent. Major cerebellar artery origins are patent. Posterior cerebral arteries are patent. Irregularity of the left greater than right P2 and P3 PCA segments. There is moderate stenosis of the proximal left P2 PCA. Venous sinuses: Patent as allowed by contrast bolus timing. Review of the MIP images confirms the above findings IMPRESSION: No large vessel occlusion or hemodynamically significant stenosis. Mild to moderate intracranial atherosclerosis.  2D echo Normal LVEF Normal LA size No wall motion abnormalities Interatrial septum not well visualized   Assessment: 65 year old woman with past medical history of hypertension, hyperlipidemia, coronary disease, bilateral mild carotid stenosis, prior stroke including ischemic strokes remotely and a recent June 2022 right thalamic ICH with subtle minimal left-sided fine motor weakness, hypertension, hyperlipidemia, migraines, came in for evaluation of fall and was noted to have bihemispheric late acute to early subacute cerebral infarctions in addition to chronic known infarctions. LDL 74-continue statin Hemoglobin A1c 6.2-goal less than 7. CT angiography of  head and neck with no emergent LVO but mild to moderate intracranial atherosclerosis Distribution of her strokes is concerning for a central cardioembolic etiology. Intracranial atherosclerosis seen on her imaging would make her a candidate for dual antiplatelets for 3 months in accordance with the SAMMPRIS trial but her very recent right thalamic ICH makes me decide against dual antiplatelets. Atrial fibrillation is noticed on outpatient monitoring, she will need anticoagulation and that would be another tough decision-but we will get there if her long-term monitoring shows atrial fibrillation.  Impression: Acute ischemic strokes bihemispheric-likely cardioembolic No known history of atrial fibrillation Prior history of right thalamic ICH in June 2022 with mild left fine motor weakness Prior ischemic infarctions with no residual deficit Other comorbid diseases as listed above  Recommendations: -Continue aspirin -30-day cardiac monitoring- If remains unremarkable for atrial fibrillation, might need TEE followed by loop recorder placement. -Continue statin -PT OT assessments completed.  Follow-up recommendations. -Follow-up with outpatient neurology-would recommend stroke clinic of her choice, if you choose to go to [facility] neurology-Dr Sethi in 4-6 weeks  Plan discussed in detail with the patient and Dr. Roosevelt Locks.  -- Amie [place], MD Neurologist [facility] Pager: [phone]

## 2021-07-02 NOTE — Progress Notes (Signed)
Occupational Therapy Treatment Patient Details Name: [name] MRN: [id number] DOB: [birth year] Today's Date: 07/02/2021    History of present illness Per MD notes, pt is a 65 y.o. Caucasian female with medical history significant for coronary artery disease, anxiety, peripheral vascular disease, depression, hypertension, dyslipidemia, ischemic cardiomyopathy, migraine and hemorrhagic CVA with right thalamic hemorrhage on 6/1 with associated left-sided weakness, who [facility] and then went home on 7/2.  2 days prior to going home she fell at the SNF and sustained left proximal humerus fracture that was conservatively managed.  She presented to the ER with acute onset of dizziness and admitted to having the syncope and head injury yesterday in the left temporal area.  MD assessment includes acute CVA, dizziness, and syncope and fall with left upper extremity pain.   OT comments  Pt seen for OT tx this date to f/u re: safety with ADLs/ADL mobility. OT facilitates ed re: safety with sequencing use of 2WW and how to adjust height of walker to correct size. Pt CTS with SUPV and MIN cues x2 trials. Pt and family member with good understanding. OT engages pt in L hand [place] exercises including 5 reps digit flexion/ext, 5 reps opposition/reposition and 5 reps abd/add. Pt left in bed with all needs met and in reach. Continue to recommend [place] f/u.    Follow Up Recommendations  Home health OT;Supervision - Intermittent    Equipment Recommendations  None recommended by OT    Recommendations for Other Services      Precautions / Restrictions Precautions Precautions: Fall Restrictions Weight Bearing Restrictions: No Other Position/Activity Restrictions: L humeral fx, non-op, no sling       Mobility Bed Mobility Overal bed mobility: Needs Assistance Bed Mobility: Supine to Sit;Sit to Supine     Supine to sit: Supervision;HOB elevated Sit to supine: Supervision   General  bed mobility comments: increased time/effort    Transfers Overall transfer level: Needs assistance Equipment used: Rolling walker (2 wheeled) Transfers: Sit to/from Stand Sit to Stand: Supervision         General transfer comment: cues for sequence of safe use of RW    Balance Overall balance assessment: Needs assistance Sitting-balance support: Feet unsupported;No upper extremity supported Sitting balance-Leahy Scale: Good Sitting balance - Comments: Supervision at EOB   Standing balance support: No upper extremity supported;During functional activity Standing balance-Leahy Scale: Fair Standing balance comment: benefits from UE support for more dynamic tasks                           ADL either performed or assessed with clinical judgement   ADL                                               Vision Baseline Vision/History: 1 Wears glasses Ability to See in Adequate Light: 0 Adequate Patient Visual Report: No change from baseline     Perception     Praxis      Cognition Arousal/Alertness: Awake/alert Behavior During Therapy: WFL for tasks assessed/performed Overall Cognitive Status: Within Functional Limits for tasks assessed                                 General Comments: slow responses, but oriented and  appropriate        Exercises Other Exercises Other Exercises: OT facilitates ed re: safety with sequencing use of 2WW and how to adjust height of walker to correct size. Pt CTS with SUPV and MIN cues x2 trials. Pt and family member with good understanding. OT engages pt in L hand [place] exercises including 5 reps digit flexion/ext, 5 reps opposition/reposition and 5 reps abd/add.   Shoulder Instructions       General Comments OT asjusted pt's personal 2WW to appropriate height and educated  pt and family member on safe sequence and height recommendations    Pertinent Vitals/ Pain       Pain Assessment:  Faces Faces Pain Scale: Hurts a little bit Pain Location: LUE Pain Descriptors / Indicators: Guarding Pain Intervention(s): Monitored during session  Home Living                                          Prior Functioning/Environment              Frequency  Min 2X/week        Progress Toward Goals  OT Goals(current goals can now be found in the care plan section)  Progress towards OT goals: Progressing toward goals  Acute Rehab OT Goals Patient Stated Goal: Walk without falling down OT Goal Formulation: With patient Time For Goal Achievement: 07/15/21 Potential to Achieve Goals: Good  Plan Discharge plan remains appropriate    Co-evaluation                 AM-PAC OT "6 Clicks" Daily Activity     Outcome Measure   Help from another person eating meals?: None Help from another person taking care of personal grooming?: A Little Help from another person toileting, which includes using toliet, bedpan, or urinal?: A Little Help from another person bathing (including washing, rinsing, drying)?: A Little Help from another person to put on and taking off regular upper body clothing?: None Help from another person to put on and taking off regular lower body clothing?: A Little 6 Click Score: 20    End of Session Equipment Utilized During Treatment: Gait belt  OT Visit Diagnosis: Other abnormalities of gait and mobility (R26.89);Muscle weakness (generalized) (M62.81);History of falling (Z91.81);Pain Pain - Right/Left: Left Pain - part of body: Arm   Activity Tolerance Patient tolerated treatment well   Patient Left in bed;with call bell/phone within reach;with bed alarm set   Nurse Communication Mobility status;Other (comment) (orthostatics)        Time: 5883-2549 OT Time Calculation (min): 13 min  Charges: OT General Charges $OT Visit: 1 Visit OT Treatments $Therapeutic Activity: 8-22 mins  Gerrianne Scale, [place], OTR/L ascom  [phone] 07/02/21, 12:55 PM

## 2021-07-02 NOTE — Progress Notes (Signed)
[facility] order placed

## 2021-07-02 NOTE — Discharge Summary (Signed)
Physician Discharge Summary  Patient ID: [name] MRN: [id number] DOB/AGE: 07-18-1956 65 y.o.  Admit date: 06/30/2021 Discharge date: 07/02/2021  Admission Diagnoses:  Discharge Diagnoses:  Active Problems:   CVA (cerebral vascular accident) [facility])   Discharged Condition: good  Hospital Course:  [name] is a 65 y.o. Caucasian female with medical history significant for coronary artery disease, anxiety, peripheral vascular disease, depression, hypertension, dyslipidemia, ischemic cardiomyopathy, migraine and hemorrhagic CVA with right thalamic hemorrhage on 6/1 with associated left-sided weakness, who [facility] and then went home on 7/2. Brain MRI showed small acute left cerebral hemisphere infarct, subacute to chronic right corona radiata lacunar infarct. Echocardiogram showed normal ejection fraction without source of clots. Patient has been seen by neurology, patient had recent history of intracranial hemorrhage, decision was made to treat with a single antiplatelet treatment with aspirin.  Continue statin. Patient also has set up Zio cardiac monitor before discharge. She is also being evaluated by PT/OT, decision is made to go home with PT/OT and RN.  #1.  Acute CVA Recent intracranial hemorrhage Spoke with neurology, patient had a history of recent intracranial hemorrhage.  Decision was made to treat with aspirin only. Patient symptom had improved.  Currently she is medically stable to be discharged.    #2.  Essential hypertension  Coronary artery disease  Stable.  #3.  Hypokalemia. Potassium still low today, she will be given 20 mEq IV potassium in addition to 80 mg oral potassium before discharge   Consults: neurology  Significant Diagnostic Studies:  Echo; Left ventricular ejection fraction, by estimation, is 60 to 65%. The left ventricle has normal function. The left ventricle has no regional wall motion abnormalities. There  is moderate left ventricular hypertrophy. Left ventricular diastolic parameters are consistent with Grade I diastolic dysfunction (impaired relaxation). 1. Right ventricular systolic function is normal. The right ventricular size is normal. Tricuspid regurgitation signal is inadequate for assessing PA pressure. 2. The mitral valve was not well visualized. Trivial mitral valve regurgitation. No evidence of mitral stenosis. 3. 4. The aortic valve is tricuspid. Aortic valve regurgitation is mild to moderate.  MRI HEAD WITHOUT CONTRAST   TECHNIQUE: Multiplanar, multiecho pulse sequences of the brain and surrounding structures were obtained without intravenous contrast.   COMPARISON:  Head CT 06/30/2021 and MRI 04/10/2021   FINDINGS: Brain: There are several subcentimeter foci of diffusion weighted signal abnormality involving white matter of the left frontal, parietal, and occipital lobes compatible with acute to early subacute infarcts. There is encephalomalacia in the right thalamus related to the previous hemorrhage. Chronic lacunar infarcts are again noted in the left basal ganglia, left internal capsule, left thalamus, and bilateral hemispheric white matter. A lacunar infarct in the posterior right corona radiata is new from the prior MRI but is late subacute to chronic in appearance. Patchy T2 hyperintensities elsewhere in the cerebral white matter bilaterally are similar to the prior MRI and are nonspecific but compatible with severe chronic small vessel ischemic disease.   A small focus of chronic hemorrhage in the left frontal lobe is unchanged from the prior MRI and is again noted to be adjacent to a curvilinear focus of susceptibility which may reflect a developmental venous anomaly and cavernoma. There is mild cerebral atrophy. No mass, midline shift, or extra-axial fluid collection is evident.   Vascular: Major intracranial vascular flow voids are preserved.    Skull and upper cervical spine: Unremarkable bone marrow signal.   Sinuses/Orbits: Unremarkable orbits. Small  volume fluid in the right sphenoid sinus. Clear mastoid air cells.   Other: None.   IMPRESSION: 1. Small acute to early subacute left cerebral hemispheric infarcts. 2. Late subacute to chronic right corona radiata lacunar infarct. 3. Severe chronic small vessel ischemic disease with multiple chronic lacunar infarcts as above.     Electronically Signed   By: Logan Bores M.D.   On: 06/30/2021 14:32   CT ANGIOGRAPHY HEAD AND NECK   TECHNIQUE: Multidetector CT imaging of the head and neck was performed using the standard protocol during bolus administration of intravenous contrast. Multiplanar CT image reconstructions and MIPs were obtained to evaluate the vascular anatomy. Carotid stenosis measurements (when applicable) are obtained utilizing NASCET criteria, using the distal internal carotid diameter as the denominator.   CONTRAST:  38m OMNIPAQUE IOHEXOL 350 MG/ML SOLN   COMPARISON:  Correlation made with recent MRI brain   FINDINGS: CTA NECK   Aortic arch: Great vessel origins are patent.   Right carotid system: Mild eccentric plaque along the distal common carotid minor stenosis. Mild calcified plaque along the proximal internal carotid with minor stenosis.   Left carotid system: Patent. Mild calcified plaque along the distal common carotid with minor stenosis. Minimal calcified plaque along the proximal internal carotid without stenosis.   Vertebral arteries: Patent. Left vertebral artery is dominant. Minimal eccentric noncalcified plaque along the distal right V3 segment. No stenosis.   Skeleton: Mild cervical spine degenerative changes.   Other neck: Unremarkable.   Upper chest: No apical lung mass.   Review of the MIP images confirms the above findings   CTA HEAD   Anterior circulation: Intracranial internal carotid arteries are patent with  minimal calcified plaque. Anterior and middle cerebral arteries are patent. Mild right M2 branch stenosis.   Posterior circulation: Intracranial vertebral arteries are patent. There is moderate stenosis of the distal right vertebral artery. Basilar artery is patent. Major cerebellar artery origins are patent. Posterior cerebral arteries are patent. Irregularity of the left greater than right P2 and P3 PCA segments. There is moderate stenosis of the proximal left P2 PCA.   Venous sinuses: Patent as allowed by contrast bolus timing.   Review of the MIP images confirms the above findings   IMPRESSION: No large vessel occlusion or hemodynamically significant stenosis.   Mild to moderate intracranial atherosclerosis.     Electronically Signed   By: PMacy MisM.D.   On: 07/01/2021 12:56     Treatments: ASA  Discharge Exam: Blood pressure (!) 142/79, pulse (!) 53, temperature 97.7 F (36.5 C), resp. rate 14, height '5\' 2"'$  (1.575 m), weight 62.2 kg, SpO2 98 %. General appearance: alert and cooperative Resp: clear to auscultation bilaterally Cardio: regular rate and rhythm, S1, S2 normal, no murmur, click, rub or gallop GI: soft, non-tender; bowel sounds normal; no masses,  no organomegaly Extremities: extremities normal, atraumatic, no cyanosis or edema  Disposition: Discharge disposition: 01-Home or Self Care       Discharge Instructions     Diet - low sodium heart healthy   Complete by: As directed    Increase activity slowly   Complete by: As directed       Allergies as of 07/02/2021       Reactions   Codeine Sulfate Other (See Comments)   sick   Penicillins Nausea And Vomiting   Has patient had a PCN reaction causing immediate rash, facial/tongue/throat swelling, SOB or lightheadedness with hypotension: No Has patient had a PCN reaction causing severe  rash involving mucus membranes or skin necrosis: No Has patient had a PCN reaction that required  hospitalization No Has patient had a PCN reaction occurring within the last 10 years: No If all of the above answers are "NO", then may proceed with Cephalosporin use.        Medication List     STOP taking these medications    furosemide 20 MG tablet Commonly known as: LASIX       TAKE these medications    acetaminophen 500 MG tablet Commonly known as: TYLENOL Take 500 mg by mouth every 6 (six) hours as needed for moderate pain or mild pain.   amLODipine 10 MG tablet Commonly known as: NORVASC Take 1 tablet (10 mg total) by mouth daily.   aspirin 81 MG EC tablet Take 1 tablet (81 mg total) by mouth daily. Swallow whole. Start taking on: July 03, 2021   FLUoxetine 40 MG capsule Commonly known as: PROZAC Take 40 mg by mouth daily.   metoprolol succinate 50 MG 24 hr tablet Commonly known as: TOPROL-XL Take 50 mg by mouth daily. Take with or immediately following a meal.   nitroGLYCERIN 0.4 MG SL tablet Commonly known as: NITROSTAT Place 0.4 mg under the tongue every 5 (five) minutes as needed for chest pain.   pantoprazole 40 MG tablet Commonly known as: PROTONIX Take 1 tablet (40 mg total) by mouth at bedtime.   rosuvastatin 20 MG tablet Commonly known as: CRESTOR Take 20 mg by mouth daily.   triamterene-hydrochlorothiazide 37.5-25 MG capsule Commonly known as: DYAZIDE Take 1 capsule by mouth daily.        Follow-up Information     Baxter Hire, MD Follow up in 1 week(s).   Specialty: Internal Medicine Contact information: [place] [address] [phone]         Minna Merritts, MD .   Specialty: Cardiology Contact information: [address] [place] [address] [phone]         [facility] Follow up in 2 week(s).   Contact information: [place] [phone]               32 minutes Signed: Sharen Hones 07/02/2021, 10:10 AM

## 2021-07-02 NOTE — TOC Initial Note (Signed)
Transition of Care [facility]) - Initial/Assessment Note    Patient Details  Name: [name] MRN: [id number] Date of Birth: 22-Sep-1956  [facility]) CM/SW Contact:    Alberteen Sam, LCSW Phone Number: 07/02/2021, 9:27 AM  Clinical Narrative:                  CSW met with patient at bedside to review PT recommendation of outpatient PT.   Patient reports she is currently getting home health services through [facility] and wishes to resume at [place] for PT, OT, RN and aide. Patient reports she lives alone however her neighbor assists her with getting meds and checking in on her. Patient would like to order a walker.   CSW has informed Clarise Cruz with [facility] that patient wishes to resume services at dc. CSW will contact Rhonda with [employer] when patient is ready to dc to deliver walker, will need DME orders for walker at dc and home health orders for PT, OT, RN and aide.    Expected Discharge Plan: [place] Barriers to Discharge: Continued Medical Work up   Patient Goals and CMS Choice Patient states their goals for this hospitalization and ongoing recovery are:: to go home CMS Medicare.gov Compare Post Acute Care list provided to:: Patient Choice offered to / list presented to : Patient  Expected Discharge Plan and Services Expected Discharge Plan: [place] Acute Care Choice: [place] arrangements for the past 2 months: Single Family Home                 DME Arranged: Gilford Rile DME Agency: [facility] Date DME Agency Contacted: 07/02/21 Time DME Agency Contacted: [phone] Representative spoke with at DME Agency: Suanne Marker HH Arranged: PT, OT, RN, Nurse's Aide [place] Agency: [facility] Date [facility] Agency Contacted: 07/02/21 Time [place]: [phone] Representative spoke with at [place]: Clarise Cruz  Prior Living Arrangements/Services Living arrangements for the past 2 months: [place] with:: Self Patient language  and need for interpreter reviewed:: Yes Do you feel safe going back to the place where you live?: Yes      Need for Family Participation in Patient Care: Yes (Comment) Care giver support system in place?: Yes (comment)   Criminal Activity/Legal Involvement Pertinent to Current Situation/Hospitalization: No - Comment as needed  Activities of Daily Living      Permission Sought/Granted         Permission granted to share info w AGENCY: [facility]        Emotional Assessment   Attitude/Demeanor/Rapport: Gracious Affect (typically observed): Calm Orientation: : Oriented to Self, Oriented to Place, Oriented to  Time, Oriented to Situation Alcohol / Substance Use: Not Applicable Psych Involvement: No (comment)  Admission diagnosis:  Vertigo [R42] CVA (cerebral vascular accident) ([place]) [I63.9] Left arm pain [M79.602] Syncope, unspecified syncope type [R55] Cerebrovascular accident (CVA) due to thrombosis of precerebral artery ([place]) [I63.00] Patient Active Problem List   Diagnosis Date Noted   CVA (cerebral vascular accident) ([place]) 06/30/2021   Thalamic hemorrhage ([place]) 04/10/2021   ICH (intracerebral hemorrhage) ([place]) 04/10/2021   Pelvic pain-right lower quadrant 07/14/2018   Right lower quadrant abdominal pain 06/30/2018   Bilateral carotid artery stenosis 05/04/9484   Chronic systolic CHF (congestive heart failure) ([place]) 03/12/2018   Status post total knee replacement using cement, left 10/06/2016   History of coronary artery stent placement    Pure hypercholesterolemia  Recurrent UTI 02/11/2016   Menopause 02/11/2016   Angina pectoris ([place]) 01/07/2016   Syncope 07/11/2015   Orthostatic hypotension 02/19/2015   Dehydration 02/19/2015   Hypokalemia 02/19/2015   Stroke ([place]) 02/18/2015   Tobacco abuse 02/18/2015   History of stroke 07/18/2014   Vertigo 07/18/2014   Bilateral wrist pain 07/18/2014   Atypical chest pain 06/05/2014   Coronary artery disease of native  artery of native heart with stable angina pectoris ([place]) 01/19/2014   Carotid arterial disease ([place]) 01/19/2014   Depression 01/16/2014   Memory loss 01/16/2014   Right sided weakness 08/10/2013   Essential hypertension 08/10/2013   Mixed hyperlipidemia 08/10/2013   History of MI (myocardial infarction) 08/10/2013   Stroke, acute, embolic ([place]) 95/18/8416   PCP:  Baxter Hire, MD Pharmacy:   [id number] [address], [place] [address] [place] [address] Phone: [phone] Fax: [phone]  [facility]) - [place], [address] [place] [address] Phone: [phone] Fax: [phone]  [id number] - [place], [place] - [address] AT [address] [place] [address] Phone: [phone] Fax: [phone]     Social Determinants of Health ([place]) Interventions    Readmission Risk Interventions No flowsheet data found.

## 2021-07-03 DIAGNOSIS — I639 Cerebral infarction, unspecified: Secondary | ICD-10-CM | POA: Diagnosis not present

## 2021-07-04 DIAGNOSIS — S42255D Nondisplaced fracture of greater tuberosity of left humerus, subsequent encounter for fracture with routine healing: Secondary | ICD-10-CM | POA: Diagnosis not present

## 2021-07-04 DIAGNOSIS — I639 Cerebral infarction, unspecified: Secondary | ICD-10-CM | POA: Diagnosis not present

## 2021-07-04 DIAGNOSIS — Z09 Encounter for follow-up examination after completed treatment for conditions other than malignant neoplasm: Secondary | ICD-10-CM | POA: Diagnosis not present

## 2021-07-07 DIAGNOSIS — E785 Hyperlipidemia, unspecified: Secondary | ICD-10-CM | POA: Diagnosis not present

## 2021-07-07 DIAGNOSIS — I110 Hypertensive heart disease with heart failure: Secondary | ICD-10-CM | POA: Diagnosis not present

## 2021-07-07 DIAGNOSIS — F339 Major depressive disorder, recurrent, unspecified: Secondary | ICD-10-CM | POA: Diagnosis not present

## 2021-07-07 DIAGNOSIS — M1712 Unilateral primary osteoarthritis, left knee: Secondary | ICD-10-CM | POA: Diagnosis not present

## 2021-07-07 DIAGNOSIS — F5101 Primary insomnia: Secondary | ICD-10-CM | POA: Diagnosis not present

## 2021-07-07 DIAGNOSIS — K219 Gastro-esophageal reflux disease without esophagitis: Secondary | ICD-10-CM | POA: Diagnosis not present

## 2021-07-07 DIAGNOSIS — I69222 Dysarthria following other nontraumatic intracranial hemorrhage: Secondary | ICD-10-CM | POA: Diagnosis not present

## 2021-07-07 DIAGNOSIS — I5022 Chronic systolic (congestive) heart failure: Secondary | ICD-10-CM | POA: Diagnosis not present

## 2021-07-07 DIAGNOSIS — G40909 Epilepsy, unspecified, not intractable, without status epilepticus: Secondary | ICD-10-CM | POA: Diagnosis not present

## 2021-07-07 DIAGNOSIS — H409 Unspecified glaucoma: Secondary | ICD-10-CM | POA: Diagnosis not present

## 2021-07-07 DIAGNOSIS — F411 Generalized anxiety disorder: Secondary | ICD-10-CM | POA: Diagnosis not present

## 2021-07-07 DIAGNOSIS — I2510 Atherosclerotic heart disease of native coronary artery without angina pectoris: Secondary | ICD-10-CM | POA: Diagnosis not present

## 2021-07-07 DIAGNOSIS — E559 Vitamin D deficiency, unspecified: Secondary | ICD-10-CM | POA: Diagnosis not present

## 2021-07-07 DIAGNOSIS — I69254 Hemiplegia and hemiparesis following other nontraumatic intracranial hemorrhage affecting left non-dominant side: Secondary | ICD-10-CM | POA: Diagnosis not present

## 2021-07-07 DIAGNOSIS — Z87440 Personal history of urinary (tract) infections: Secondary | ICD-10-CM | POA: Diagnosis not present

## 2021-07-07 DIAGNOSIS — S42202D Unspecified fracture of upper end of left humerus, subsequent encounter for fracture with routine healing: Secondary | ICD-10-CM | POA: Diagnosis not present

## 2021-07-07 DIAGNOSIS — Z9181 History of falling: Secondary | ICD-10-CM | POA: Diagnosis not present

## 2021-07-11 DIAGNOSIS — I69222 Dysarthria following other nontraumatic intracranial hemorrhage: Secondary | ICD-10-CM | POA: Diagnosis not present

## 2021-07-11 DIAGNOSIS — F5101 Primary insomnia: Secondary | ICD-10-CM | POA: Diagnosis not present

## 2021-07-11 DIAGNOSIS — F411 Generalized anxiety disorder: Secondary | ICD-10-CM | POA: Diagnosis not present

## 2021-07-11 DIAGNOSIS — Z87440 Personal history of urinary (tract) infections: Secondary | ICD-10-CM | POA: Diagnosis not present

## 2021-07-11 DIAGNOSIS — S42202D Unspecified fracture of upper end of left humerus, subsequent encounter for fracture with routine healing: Secondary | ICD-10-CM | POA: Diagnosis not present

## 2021-07-11 DIAGNOSIS — E785 Hyperlipidemia, unspecified: Secondary | ICD-10-CM | POA: Diagnosis not present

## 2021-07-11 DIAGNOSIS — F339 Major depressive disorder, recurrent, unspecified: Secondary | ICD-10-CM | POA: Diagnosis not present

## 2021-07-11 DIAGNOSIS — I110 Hypertensive heart disease with heart failure: Secondary | ICD-10-CM | POA: Diagnosis not present

## 2021-07-11 DIAGNOSIS — G40909 Epilepsy, unspecified, not intractable, without status epilepticus: Secondary | ICD-10-CM | POA: Diagnosis not present

## 2021-07-11 DIAGNOSIS — I2510 Atherosclerotic heart disease of native coronary artery without angina pectoris: Secondary | ICD-10-CM | POA: Diagnosis not present

## 2021-07-11 DIAGNOSIS — E559 Vitamin D deficiency, unspecified: Secondary | ICD-10-CM | POA: Diagnosis not present

## 2021-07-11 DIAGNOSIS — I5022 Chronic systolic (congestive) heart failure: Secondary | ICD-10-CM | POA: Diagnosis not present

## 2021-07-11 DIAGNOSIS — I69254 Hemiplegia and hemiparesis following other nontraumatic intracranial hemorrhage affecting left non-dominant side: Secondary | ICD-10-CM | POA: Diagnosis not present

## 2021-07-11 DIAGNOSIS — Z9181 History of falling: Secondary | ICD-10-CM | POA: Diagnosis not present

## 2021-07-11 DIAGNOSIS — M1712 Unilateral primary osteoarthritis, left knee: Secondary | ICD-10-CM | POA: Diagnosis not present

## 2021-07-11 DIAGNOSIS — K219 Gastro-esophageal reflux disease without esophagitis: Secondary | ICD-10-CM | POA: Diagnosis not present

## 2021-07-11 DIAGNOSIS — H409 Unspecified glaucoma: Secondary | ICD-10-CM | POA: Diagnosis not present

## 2021-07-18 DIAGNOSIS — Z87440 Personal history of urinary (tract) infections: Secondary | ICD-10-CM | POA: Diagnosis not present

## 2021-07-18 DIAGNOSIS — E785 Hyperlipidemia, unspecified: Secondary | ICD-10-CM | POA: Diagnosis not present

## 2021-07-18 DIAGNOSIS — I69222 Dysarthria following other nontraumatic intracranial hemorrhage: Secondary | ICD-10-CM | POA: Diagnosis not present

## 2021-07-18 DIAGNOSIS — E559 Vitamin D deficiency, unspecified: Secondary | ICD-10-CM | POA: Diagnosis not present

## 2021-07-18 DIAGNOSIS — I69254 Hemiplegia and hemiparesis following other nontraumatic intracranial hemorrhage affecting left non-dominant side: Secondary | ICD-10-CM | POA: Diagnosis not present

## 2021-07-18 DIAGNOSIS — F411 Generalized anxiety disorder: Secondary | ICD-10-CM | POA: Diagnosis not present

## 2021-07-18 DIAGNOSIS — F339 Major depressive disorder, recurrent, unspecified: Secondary | ICD-10-CM | POA: Diagnosis not present

## 2021-07-18 DIAGNOSIS — I5022 Chronic systolic (congestive) heart failure: Secondary | ICD-10-CM | POA: Diagnosis not present

## 2021-07-18 DIAGNOSIS — S42202D Unspecified fracture of upper end of left humerus, subsequent encounter for fracture with routine healing: Secondary | ICD-10-CM | POA: Diagnosis not present

## 2021-07-18 DIAGNOSIS — G40909 Epilepsy, unspecified, not intractable, without status epilepticus: Secondary | ICD-10-CM | POA: Diagnosis not present

## 2021-07-18 DIAGNOSIS — I2510 Atherosclerotic heart disease of native coronary artery without angina pectoris: Secondary | ICD-10-CM | POA: Diagnosis not present

## 2021-07-18 DIAGNOSIS — M1712 Unilateral primary osteoarthritis, left knee: Secondary | ICD-10-CM | POA: Diagnosis not present

## 2021-07-18 DIAGNOSIS — H409 Unspecified glaucoma: Secondary | ICD-10-CM | POA: Diagnosis not present

## 2021-07-18 DIAGNOSIS — Z9181 History of falling: Secondary | ICD-10-CM | POA: Diagnosis not present

## 2021-07-18 DIAGNOSIS — I110 Hypertensive heart disease with heart failure: Secondary | ICD-10-CM | POA: Diagnosis not present

## 2021-07-18 DIAGNOSIS — K219 Gastro-esophageal reflux disease without esophagitis: Secondary | ICD-10-CM | POA: Diagnosis not present

## 2021-07-18 DIAGNOSIS — F5101 Primary insomnia: Secondary | ICD-10-CM | POA: Diagnosis not present

## 2021-07-24 ENCOUNTER — Inpatient Hospital Stay
Admission: EM | Admit: 2021-07-24 | Discharge: 2021-07-27 | DRG: 065 | Disposition: A | Discharge status: Home Health | Payer: PPO | Attending: Family Medicine | Admitting: Family Medicine

## 2021-07-24 ENCOUNTER — Emergency Department: Payer: PPO

## 2021-07-24 ENCOUNTER — Inpatient Hospital Stay: Payer: PPO

## 2021-07-24 ENCOUNTER — Other Ambulatory Visit: Payer: Self-pay

## 2021-07-24 DIAGNOSIS — F17210 Nicotine dependence, cigarettes, uncomplicated: Secondary | ICD-10-CM | POA: Diagnosis present

## 2021-07-24 DIAGNOSIS — M1990 Unspecified osteoarthritis, unspecified site: Secondary | ICD-10-CM | POA: Diagnosis not present

## 2021-07-24 DIAGNOSIS — Z87440 Personal history of urinary (tract) infections: Secondary | ICD-10-CM | POA: Diagnosis not present

## 2021-07-24 DIAGNOSIS — Z90722 Acquired absence of ovaries, bilateral: Secondary | ICD-10-CM

## 2021-07-24 DIAGNOSIS — Z8249 Family history of ischemic heart disease and other diseases of the circulatory system: Secondary | ICD-10-CM | POA: Diagnosis not present

## 2021-07-24 DIAGNOSIS — R42 Dizziness and giddiness: Secondary | ICD-10-CM | POA: Diagnosis not present

## 2021-07-24 DIAGNOSIS — R269 Unspecified abnormalities of gait and mobility: Secondary | ICD-10-CM | POA: Diagnosis present

## 2021-07-24 DIAGNOSIS — E876 Hypokalemia: Secondary | ICD-10-CM | POA: Diagnosis present

## 2021-07-24 DIAGNOSIS — I110 Hypertensive heart disease with heart failure: Secondary | ICD-10-CM | POA: Diagnosis present

## 2021-07-24 DIAGNOSIS — Z880 Allergy status to penicillin: Secondary | ICD-10-CM

## 2021-07-24 DIAGNOSIS — Z885 Allergy status to narcotic agent status: Secondary | ICD-10-CM

## 2021-07-24 DIAGNOSIS — I25118 Atherosclerotic heart disease of native coronary artery with other forms of angina pectoris: Secondary | ICD-10-CM | POA: Diagnosis present

## 2021-07-24 DIAGNOSIS — F32A Depression, unspecified: Secondary | ICD-10-CM | POA: Diagnosis not present

## 2021-07-24 DIAGNOSIS — E782 Mixed hyperlipidemia: Secondary | ICD-10-CM | POA: Diagnosis not present

## 2021-07-24 DIAGNOSIS — K219 Gastro-esophageal reflux disease without esophagitis: Secondary | ICD-10-CM | POA: Diagnosis present

## 2021-07-24 DIAGNOSIS — I10 Essential (primary) hypertension: Secondary | ICD-10-CM | POA: Diagnosis not present

## 2021-07-24 DIAGNOSIS — Z823 Family history of stroke: Secondary | ICD-10-CM

## 2021-07-24 DIAGNOSIS — I6340 Cerebral infarction due to embolism of unspecified cerebral artery: Secondary | ICD-10-CM | POA: Diagnosis not present

## 2021-07-24 DIAGNOSIS — F419 Anxiety disorder, unspecified: Secondary | ICD-10-CM | POA: Diagnosis present

## 2021-07-24 DIAGNOSIS — I5032 Chronic diastolic (congestive) heart failure: Secondary | ICD-10-CM | POA: Diagnosis not present

## 2021-07-24 DIAGNOSIS — I6389 Other cerebral infarction: Secondary | ICD-10-CM | POA: Diagnosis not present

## 2021-07-24 DIAGNOSIS — Z955 Presence of coronary angioplasty implant and graft: Secondary | ICD-10-CM | POA: Diagnosis not present

## 2021-07-24 DIAGNOSIS — I639 Cerebral infarction, unspecified: Secondary | ICD-10-CM | POA: Diagnosis not present

## 2021-07-24 DIAGNOSIS — R29702 NIHSS score 2: Secondary | ICD-10-CM | POA: Diagnosis present

## 2021-07-24 DIAGNOSIS — Z79899 Other long term (current) drug therapy: Secondary | ICD-10-CM

## 2021-07-24 DIAGNOSIS — H814 Vertigo of central origin: Secondary | ICD-10-CM | POA: Diagnosis not present

## 2021-07-24 DIAGNOSIS — I255 Ischemic cardiomyopathy: Secondary | ICD-10-CM | POA: Diagnosis present

## 2021-07-24 DIAGNOSIS — I69354 Hemiplegia and hemiparesis following cerebral infarction affecting left non-dominant side: Secondary | ICD-10-CM | POA: Diagnosis not present

## 2021-07-24 DIAGNOSIS — Z96653 Presence of artificial knee joint, bilateral: Secondary | ICD-10-CM | POA: Diagnosis present

## 2021-07-24 DIAGNOSIS — Z23 Encounter for immunization: Secondary | ICD-10-CM

## 2021-07-24 DIAGNOSIS — Z8616 Personal history of COVID-19: Secondary | ICD-10-CM

## 2021-07-24 DIAGNOSIS — I672 Cerebral atherosclerosis: Secondary | ICD-10-CM | POA: Diagnosis not present

## 2021-07-24 DIAGNOSIS — I6319 Cerebral infarction due to embolism of other precerebral artery: Secondary | ICD-10-CM | POA: Diagnosis not present

## 2021-07-24 DIAGNOSIS — Z83438 Family history of other disorder of lipoprotein metabolism and other lipidemia: Secondary | ICD-10-CM | POA: Diagnosis not present

## 2021-07-24 DIAGNOSIS — I252 Old myocardial infarction: Secondary | ICD-10-CM

## 2021-07-24 DIAGNOSIS — R531 Weakness: Secondary | ICD-10-CM | POA: Diagnosis not present

## 2021-07-24 DIAGNOSIS — W19XXXA Unspecified fall, initial encounter: Secondary | ICD-10-CM | POA: Diagnosis present

## 2021-07-24 DIAGNOSIS — Z8673 Personal history of transient ischemic attack (TIA), and cerebral infarction without residual deficits: Secondary | ICD-10-CM | POA: Diagnosis not present

## 2021-07-24 DIAGNOSIS — Z7982 Long term (current) use of aspirin: Secondary | ICD-10-CM

## 2021-07-24 LAB — DIFFERENTIAL
Abs Immature Granulocytes: 0.02 10*3/uL (ref 0.00–0.07)
Basophils Absolute: 0.1 10*3/uL (ref 0.0–0.1)
Basophils Relative: 1 %
Eosinophils Absolute: 0.4 10*3/uL (ref 0.0–0.5)
Eosinophils Relative: 5 %
Immature Granulocytes: 0 %
Lymphocytes Relative: 24 %
Lymphs Abs: 2 10*3/uL (ref 0.7–4.0)
Monocytes Absolute: 0.7 10*3/uL (ref 0.1–1.0)
Monocytes Relative: 9 %
Neutro Abs: 4.9 10*3/uL (ref 1.7–7.7)
Neutrophils Relative %: 61 %

## 2021-07-24 LAB — APTT: aPTT: 26 seconds (ref 24–36)

## 2021-07-24 LAB — COMPREHENSIVE METABOLIC PANEL
ALT: 16 U/L (ref 0–44)
AST: 21 U/L (ref 15–41)
Albumin: 3.6 g/dL (ref 3.5–5.0)
Alkaline Phosphatase: 75 U/L (ref 38–126)
Anion gap: 10 (ref 5–15)
BUN: 14 mg/dL (ref 8–23)
CO2: 26 mmol/L (ref 22–32)
Calcium: 9 mg/dL (ref 8.9–10.3)
Chloride: 104 mmol/L (ref 98–111)
Creatinine, Ser: 0.84 mg/dL (ref 0.44–1.00)
GFR, Estimated: >60 mL/min (ref >60)
Glucose, Bld: 99 mg/dL (ref 70–99)
Potassium: 2.9 mmol/L — ABNORMAL LOW (ref 3.5–5.1)
Sodium: 140 mmol/L (ref 135–145)
Total Bilirubin: 0.6 mg/dL (ref 0.3–1.2)
Total Protein: 6.8 g/dL (ref 6.5–8.1)

## 2021-07-24 LAB — CBC
HCT: 32 % — ABNORMAL LOW (ref 36.0–46.0)
Hemoglobin: 11.7 g/dL — ABNORMAL LOW (ref 12.0–15.0)
MCH: 34.1 pg — ABNORMAL HIGH (ref 26.0–34.0)
MCHC: 36.6 g/dL — ABNORMAL HIGH (ref 30.0–36.0)
MCV: 93.3 fL (ref 80.0–100.0)
Platelets: 276 10*3/uL (ref 150–400)
RBC: 3.43 MIL/uL — ABNORMAL LOW (ref 3.87–5.11)
RDW: 13.6 % (ref 11.5–15.5)
WBC: 8.1 10*3/uL (ref 4.0–10.5)
nRBC: 0 % (ref 0.0–0.2)

## 2021-07-24 LAB — PROTIME-INR
INR: 1.1 (ref 0.8–1.2)
Prothrombin Time: 13.9 seconds (ref 11.4–15.2)

## 2021-07-24 MED ORDER — LORAZEPAM 0.5 MG PO TABS
0.5000 mg | ORAL_TABLET | Freq: Once | ORAL | Status: AC
  Administered 2021-07-24: 0.5 mg via ORAL
  Filled 2021-07-24: qty 1

## 2021-07-24 MED ORDER — ONDANSETRON HCL 4 MG/2ML IJ SOLN: 4.0000 mg | Freq: Four times a day (QID) | INTRAMUSCULAR | Status: DC | PRN

## 2021-07-24 MED ORDER — IOHEXOL 350 MG/ML SOLN
75.0000 mL | Freq: Once | INTRAVENOUS | Status: DC | PRN
  Filled 2021-07-24: qty 75

## 2021-07-24 MED ORDER — POTASSIUM CHLORIDE 10 MEQ/100ML IV SOLN: 10.0000 meq | INTRAVENOUS | Status: DC

## 2021-07-24 MED ORDER — CLOPIDOGREL BISULFATE 75 MG PO TABS
75.0000 mg | ORAL_TABLET | Freq: Every day | ORAL | Status: DC
  Administered 2021-07-25 – 2021-07-27 (×3): 75 mg via ORAL
  Filled 2021-07-24 (×2): qty 1

## 2021-07-24 MED ORDER — ASPIRIN EC 81 MG PO TBEC
81.0000 mg | DELAYED_RELEASE_TABLET | Freq: Every day | ORAL | Status: DC
  Administered 2021-07-25 – 2021-07-27 (×3): 81 mg via ORAL
  Filled 2021-07-24 (×2): qty 1

## 2021-07-24 MED ORDER — MELATONIN 5 MG PO TABS
2.5000 mg | ORAL_TABLET | Freq: Every evening | ORAL | Status: DC | PRN
  Administered 2021-07-25 – 2021-07-26 (×3): 2.5 mg via ORAL
  Filled 2021-07-24 (×5): qty 0.5

## 2021-07-24 MED ORDER — POTASSIUM CHLORIDE IN NACL 40-0.9 MEQ/L-% IV SOLN
INTRAVENOUS | Status: AC
  Filled 2021-07-24 (×2): qty 1000

## 2021-07-24 MED ORDER — ACETAMINOPHEN 325 MG PO TABS: 650.0000 mg | ORAL_TABLET | Freq: Four times a day (QID) | ORAL | Status: DC | PRN

## 2021-07-24 MED ORDER — INFLUENZA VAC SPLIT QUAD 0.5 ML IM SUSY
0.5000 mL | PREFILLED_SYRINGE | INTRAMUSCULAR | Status: AC
  Administered 2021-07-25: 0.5 mL via INTRAMUSCULAR
  Filled 2021-07-24: qty 0.5

## 2021-07-24 MED ORDER — POLYETHYLENE GLYCOL 3350 17 G PO PACK: 17.0000 g | PACK | Freq: Every day | ORAL | Status: DC | PRN

## 2021-07-24 MED ORDER — POTASSIUM CHLORIDE 10 MEQ/100ML IV SOLN
10.0000 meq | INTRAVENOUS | Status: AC
Start: 2021-07-25 — End: 2021-07-25
  Administered 2021-07-24 – 2021-07-25 (×3): 10 meq via INTRAVENOUS
  Filled 2021-07-24 (×3): qty 100

## 2021-07-24 MED ORDER — IOHEXOL 350 MG/ML SOLN
75.0000 mL | Freq: Once | INTRAVENOUS | Status: AC | PRN
  Administered 2021-07-24: 75 mL via INTRAVENOUS
  Filled 2021-07-24: qty 75

## 2021-07-24 MED ORDER — ROSUVASTATIN CALCIUM 20 MG PO TABS
20.0000 mg | ORAL_TABLET | Freq: Every day | ORAL | Status: DC
  Administered 2021-07-25 – 2021-07-26 (×2): 20 mg via ORAL
  Filled 2021-07-24 (×2): qty 1

## 2021-07-24 NOTE — ED Triage Notes (Signed)
Pt comes into the ED via [facility] from home c/o possible stroke.  Pt woke up this morning with generalized weakness and unable to get up and go to the bathroom.  She went back to sleep and then woke up again and started having dizziness and nausea.  Pt had stroke in May.  Pt has also had TIA's in the past.  Pt LKW was last night.   145/67 54HR  97% RA

## 2021-07-24 NOTE — ED Provider Notes (Signed)
[facility] Emergency Department Provider Note   ____________________________________________   Event Date/Time   First MD Initiated Contact with Patient 07/24/21 1036     (approximate)  I have reviewed the triage vital signs and the nursing notes.   HISTORY  Chief Complaint Weakness    HPI [name] is a 65 y.o. female who presents for vertigo and ambulatory dysfunction  LOCATION: Generalized DURATION: Began this morning when she awoke TIMING: Slightly improved since onset SEVERITY: Severe QUALITY: Vertigo and ambulatory dysfunction CONTEXT: Patient states that when she awoke this morning she had the sensation of the room spinning around her and was unable to ambulate without assistance as well as had an episode of nausea and vomiting.  Patient states that the symptoms are similar to symptoms she had when she had a CVA earlier this year MODIFYING FACTORS: Denies any exacerbating or relieving factors but does state that standing up and trying to walk is extremely difficult ASSOCIATED SYMPTOMS: Nausea/vomiting   Per medical record review, patient has history of CVA in the left cerebral hemisphere, right corona radiata         Past Medical History:  Diagnosis Date   Anxiety    Arthritis    CAD (coronary artery disease)    a. 03/2013 NSTEMI/Cath: 100 RCA, EF 45%->Med Rx;  b. 12/2013 Cath/PCI: LAD 70m 30d, D1 80, D2 60, LCX nl, RCA 50ost/p, 674m95d (2.5x33 Xience DES);  c. 05/2014 Lexi MV: EF 68%, no ischemia; d. stress echo 12/2015 no ischemia @ max exercise (did not achieve target)   Carotid disease, bilateral ([place]   a. 08/2013 Carotid U/S: 1-39% bilat ICA stenosis.   Chronic kidney disease    Chronic kidney infections. Takes daily preventative.   Complication of anesthesia    CVA (cerebral vascular accident) ([place]   a. 08/2013.   CVA (cerebral vascular accident) ([facility]   Decreased libido    Depression    GERD (gastroesophageal reflux  disease)    History of 2019 novel coronavirus disease (COVID-19) 07/2019   History of recurrent UTIs    Hypercholesteremia    Hyperlipemia    Hypertension    Insomnia    Ischemic cardiomyopathy    a. 03/2013 Echo: EF 30-35%, mod dil LA, mod-sev MR, mod TR;  b. 08/2013 Echo EF 60-65%, mild AI.   Menopause    Migraines    Myocardial infarction (HCC)    PONV (postoperative nausea and vomiting)    If anesthsia is given slowly, pt will not throw up, if given quickly, pt will have nausea and vomiting.   Right lower quadrant pain    Syncope and collapse    Tobacco abuse    Vaginal atrophy     Patient Active Problem List   Diagnosis Date Noted   CVA (cerebral vascular accident) ([facility]/22/2022   Thalamic hemorrhage (HC[place]06/12/2020   ICH (intracerebral hemorrhage) ([place]CA[place]06/12/2020   Pelvic pain-right lower quadrant 07/14/2018   Right lower quadrant abdominal pain 06/30/2018   Bilateral carotid artery stenosis [id number] Chronic systolic CHF (congestive heart failure) (HCP[place]05/02/2018   Status post total knee replacement using cement, left 10/06/2016   History of coronary artery stent placement    Pure hypercholesterolemia    Recurrent UTI 02/11/2016   Menopause 02/11/2016   Angina pectoris (HCL[place]02/28/2017   Syncope 07/11/2015   Orthostatic hypotension 02/19/2015   Dehydration 02/19/2015   Hypokalemia 02/19/2015   Stroke (HC[facility]04/09/2015  Tobacco abuse 02/18/2015   History of stroke 07/18/2014   Vertigo 07/18/2014   Bilateral wrist pain 07/18/2014   Atypical chest pain 06/05/2014   Coronary artery disease of native artery of native heart with stable angina pectoris ([place]) 01/19/2014   Carotid arterial disease ([place]) 01/19/2014   Depression 01/16/2014   Memory loss 01/16/2014   Right sided weakness 08/10/2013   Essential hypertension 08/10/2013   Mixed hyperlipidemia 08/10/2013   History of MI (myocardial infarction) 08/10/2013   Stroke, acute, embolic ([place]) [id number]     Past Surgical History:  Procedure Laterality Date   CARDIAC CATHETERIZATION  01/01/2014   CARDIAC CATHETERIZATION  03/16/2013   CARDIAC CATHETERIZATION  12/2013   CARDIAC CATHETERIZATION N/A 08/19/2016   Procedure: Left Heart Cath and Coronary Angiography;  Surgeon: Minna Merritts, MD;  Location: [facility];  Service: Cardiovascular;  Laterality: N/A;   CESAREAN SECTION WITH BILATERAL TUBAL LIGATION     CORONARY ANGIOPLASTY WITH STENT PLACEMENT  01/01/2014   95% lesion with a drug eluting stent to the distal RCA.   ENDOMETRIAL ABLATION     KNEE ARTHROSCOPY  11/27/2006   left knee    OOPHORECTOMY     TOTAL KNEE ARTHROPLASTY Left 10/06/2016   Procedure: TOTAL KNEE ARTHROPLASTY;  Surgeon: Corky Mull, MD;  Location: [facility];  Service: Orthopedics;  Laterality: Left;   TOTAL KNEE ARTHROPLASTY Right 11/12/2020   Procedure: TOTAL KNEE ARTHROPLASTY;  Surgeon: Corky Mull, MD;  Location: [facility] ORS;  Service: Orthopedics;  Laterality: Right;   TUBAL LIGATION      Prior to Admission medications   Medication Sig Start Date End Date Taking? Authorizing Provider  acetaminophen (TYLENOL) 500 MG tablet Take 500 mg by mouth every 6 (six) hours as needed for moderate pain or mild pain.    [provider]  amLODipine (NORVASC) 10 MG tablet Take 1 tablet (10 mg total) by mouth daily. Patient not taking: No sig reported 04/11/21   Flora Lipps, MD  aspirin EC 81 MG EC tablet Take 1 tablet (81 mg total) by mouth daily. Swallow whole. 07/03/21   Sharen Hones, MD  FLUoxetine (PROZAC) 40 MG capsule Take 40 mg by mouth daily.    [provider]  metoprolol succinate (TOPROL-XL) 50 MG 24 hr tablet Take 50 mg by mouth daily. Take with or immediately following a meal.    [provider]  nitroGLYCERIN (NITROSTAT) 0.4 MG SL tablet Place 0.4 mg under the tongue every 5 (five) minutes as needed for chest pain.    [provider]  pantoprazole (PROTONIX) 40 MG tablet  Take 1 tablet (40 mg total) by mouth at bedtime. 04/10/21   Flora Lipps, MD  rosuvastatin (CRESTOR) 20 MG tablet Take 20 mg by mouth daily.    [provider]  triamterene-hydrochlorothiazide (DYAZIDE) 37.5-25 MG capsule Take 1 capsule by mouth daily.    [provider]    Allergies Codeine sulfate and Penicillins  Family History  Problem Relation Age of Onset   Hypertension Mother    Stroke Mother    Hyperlipidemia Mother    Breast cancer Paternal Grandmother    Colon cancer Neg Hx    Ovarian cancer Neg Hx    Heart disease Neg Hx    Diabetes Neg Hx     Social History Social History   Tobacco Use   Smoking status: Every Day    Packs/day: 1.50    Years: 20.00    Pack years: 30.00  Types: Cigarettes    Last attempt to quit: 04/07/2013    Years since quitting: 8.3   Smokeless tobacco: Never  Vaping Use   Vaping Use: Never used  Substance Use Topics   Alcohol use: Yes    Comment: 1 glass of wine a month   Drug use: No    Review of Systems Constitutional: No fever/chills Eyes: No visual changes. ENT: No sore throat. Cardiovascular: Denies chest pain. Respiratory: Denies shortness of breath. Gastrointestinal: No abdominal pain.  No nausea, no vomiting.  No diarrhea. Genitourinary: Negative for dysuria. Musculoskeletal: Negative for acute arthralgias Skin: Negative for rash. Neurological: Positive for vertigo that began this morning as well as difficulty with ambulation.  Negative for headaches, weakness/numbness/paresthesias in any extremity Psychiatric: Negative for suicidal ideation/homicidal ideation   ____________________________________________   PHYSICAL EXAM:  VITAL SIGNS: ED Triage Vitals  Enc Vitals Group     BP 07/24/21 0847 122/72     Pulse Rate 07/24/21 0847 (!) 53     Resp 07/24/21 0847 18     Temp 07/24/21 0847 98 F (36.7 C)     Temp Source 07/24/21 0847 Oral     SpO2 07/24/21 0847 95 %     Weight 07/24/21 0848 138 lb  14.2 oz (63 kg)     Height 07/24/21 0848 '5\' 2"'$  (1.575 m)     Head Circumference --      Peak Flow --      Pain Score 07/24/21 0848 0     Pain Loc --      Pain Edu? --      Excl. in [place]? --    Constitutional: Alert and oriented. Well appearing and in no acute distress. Eyes: Conjunctivae are normal. PERRL.  No nystagmus. Head: Atraumatic. Nose: No congestion/rhinnorhea. Mouth/Throat: Mucous membranes are moist. Neck: No stridor Cardiovascular: Grossly normal heart sounds.  Good peripheral circulation. Respiratory: Normal respiratory effort.  No retractions. Gastrointestinal: Soft and nontender. No distention. Musculoskeletal: No obvious deformities Neurologic:  Normal speech and language. No gross focal neurologic deficits are appreciated.  No nystagmus.  Ambulatory independently with difficulty and very slow to ambulate Skin:  Skin is warm and dry. No rash noted. Psychiatric: Mood and affect are normal. Speech and behavior are normal.  ____________________________________________   LABS (all labs ordered are listed, but only abnormal results are displayed)  Labs Reviewed  CBC - Abnormal; Notable for the following components:      Result Value   RBC 3.43 (*)    Hemoglobin 11.7 (*)    HCT 32.0 (*)    MCH 34.1 (*)    MCHC 36.6 (*)    All other components within normal limits  COMPREHENSIVE METABOLIC PANEL - Abnormal; Notable for the following components:   Potassium 2.9 (*)    All other components within normal limits  PROTIME-INR  APTT  DIFFERENTIAL  CBG MONITORING, ED   ____________________________________________  EKG  ED ECG REPORT I, Naaman Plummer, the attending physician, personally viewed and interpreted this ECG.  Date: 07/24/2021 EKG Time: 0848 Rate: 54 Rhythm: Bradycardic sinus rhythm QRS Axis: normal Intervals: normal ST/T Wave abnormalities: normal Narrative Interpretation: Bradycardic sinus rhythm.  No evidence of acute  ischemia  ____________________________________________  RADIOLOGY  ED MD interpretation: CT of the head without contrast shows no evidence of acute abnormalities including no intracerebral hemorrhage, obvious masses, or significant edema.  Does show findings of chronic small vessel disease including chronic small vessel infarcts  Official radiology report(s): CT HEAD  WO CONTRAST  Result Date: 07/24/2021 CLINICAL DATA:  Possible stroke.  Generalized weakness EXAM: CT HEAD WITHOUT CONTRAST TECHNIQUE: Contiguous axial images were obtained from the base of the skull through the vertex without intravenous contrast. COMPARISON:  06/30/2021 FINDINGS: Brain: No evidence of acute infarction, hemorrhage, hydrocephalus, extra-axial collection or mass lesion/mass effect.Chronic small vessel ischemia in the hemispheric white matter with chronic lacunar infarcts at the left caudate head and genu of the corpus callosum. Recent left cerebral facet recent cerebral infarcts by MRI are not detected due to small size and timing. Vascular: No hyperdense vessel or unexpected calcification. Skull: Normal. Negative for fracture or focal lesion. Sinuses/Orbits: No acute finding. IMPRESSION: 1. No acute or interval finding. 2. Chronic small vessel disease including chronic small-vessel infarcts. Electronically Signed   By: Jorje Guild M.D.   On: 07/24/2021 09:13    ____________________________________________   PROCEDURES  Procedure(s) performed (including Critical Care):  .1-3 Lead EKG Interpretation Performed by: Naaman Plummer, MD Authorized by: Naaman Plummer, MD     Interpretation: abnormal     ECG rate:  55   ECG rate assessment: bradycardic     Rhythm: sinus bradycardia     Ectopy: none     Conduction: normal    CRITICAL CARE Performed by: Naaman Plummer   Total critical care time: 47 minutes  Critical care time was exclusive of separately billable procedures and treating other  patients.  Critical care was necessary to treat or prevent imminent or life-threatening deterioration.  Critical care was time spent personally by me on the following activities: development of treatment plan with patient and/or surrogate as well as nursing, discussions with consultants, evaluation of patient's response to treatment, examination of patient, obtaining history from patient or surrogate, ordering and performing treatments and interventions, ordering and review of laboratory studies, ordering and review of radiographic studies, pulse oximetry and re-evaluation of patient's condition.  ____________________________________________   INITIAL IMPRESSION / ASSESSMENT AND PLAN / ED COURSE  As part of my medical decision making, I reviewed the following data within the electronic medical record, if available:  Nursing notes reviewed and incorporated, Labs reviewed, EKG interpreted, Old chart reviewed, Radiograph reviewed and Notes from prior ED visits reviewed and incorporated      Patient is a 65 year old female who presents with symptoms concerning for TIA/CVA PMH risk factors: Previous CVA Neurologic Deficits: Vertigo, ambulatory dysfunction Last known Well Time: 7 AM NIH Stroke Score: 2 Given History and Exam I have lower suspicion for infectious etiology, neurologic changes secondary to toxicologic ingestion, seizure, complex migraine. Presentation concerning for possible stroke requiring workup.  Workup: Labs: POC glucose, CBC, BMP, LFTs, Troponin, PT/INR, PTT, Type and Screen Other Diagnostics: ECG, CXR, non-contrast head CT followed by CTA brain and neck (to r/o large vessel occlusion amenable to thrombectomy) Interventions: Patient's not eligible for TPA due to improvement of symptoms and outside of window  Consult: hospitalist Disposition: Admit      ____________________________________________   FINAL CLINICAL IMPRESSION(S) / ED DIAGNOSES  Final diagnoses:   None     ED Discharge Orders     None        Note:  This document was prepared using Dragon voice recognition software and may include unintentional dictation errors.    Naaman Plummer, MD 07/24/21 [phone]

## 2021-07-24 NOTE — Progress Notes (Signed)
Neurology Telephone Note  Called by Dr. Cheri Fowler to discuss this patient with recent known CVAs who presented with vertigo. 65 y.o. female past medical history of hypertension, hyperlipidemia coronary artery disease, bilateral mild carotid stenosis, prior stroke including right thalamic ICH in June 2022 (likely hypertensive) w/ minimal residual left-sided weakness, hypertension, hyperlipidemia, another prior stroke in 2014 with no deficits, former smoker, history of migraines. LKW last night. NIHSS = 2 for chronic deficits. Outside window for TNK. MRI brain with multifocal small acute and subacute infarcts, bilateral suggesting central embolic source. Recommend admission to hospitalist service for stroke workup.   Interim recommendations: - ASA '81mg'$  daily + plavix '75mg'$  daily - CTA H&N or MRA H&N given finding of vertigo but no e/o posterior circulation stroke. She did present with vertigo in August when she had her most recent strokes so this does not need to be done emergently or as part of a stroke code.  - TTE performed 07/01/21 showed no intracardiac clot. She will need a TEE given that strokes are bilateral and thus central embolic. Recommend consulting cardiology. I don't know if they will request a repeat TTE prior.  - Will likely also need 30 day event monitor to further evaluate for a fib  - Permissive HTN x48 hrs from sx onset goal BP <220/110. PRN labetalol or hydralazine if BP above these parameters. Avoid oral antihypertensives. - Check A1c and LDL + add statin per guidelines - q4 hr neuro checks - STAT head CT for any change in neuro exam - Tele - PT/OT/SLP - Stroke education  I will see her in consultation in AM.  Su Monks, MD [facility] [phone]  If 7pm- 7am, please page neurology on call as listed in [place].

## 2021-07-24 NOTE — ED Triage Notes (Signed)
See previous triage note. Pt disoriented to time. Clear speech. +dizziness at this time.

## 2021-07-24 NOTE — ED Notes (Signed)
See triage note  presents via EMS with some dizziness  states she woke up this way  states room is spinning

## 2021-07-24 NOTE — H&P (Addendum)
History and Physical  [name] [id number] DOB: Mar 15, 1956 DOA: 07/24/2021  Referring physician: Dr. Cheri Fowler, [place]. PCP: Baxter Hire, MD  Outpatient Specialists: Neurology. Patient coming from: Home.  Chief Complaint: Severe dizziness and nausea.  HPI: [name] is a 65 y.o. female with medical history significant for recent known CVAs, acute right thalamic ICH, coronary artery disease status post PCI with stent, bilateral carotid disease, GERD, hyperlipidemia, hypertension, ischemic cardiomyopathy, tobacco use disorder, who presented to [facility] ED after waking up this morning with severe vertigo, nausea and inability to ambulate.  Last known well was last night.  EMS was activated this morning.  Patient was brought to the ED for further evaluation.  Initial CT head shows no acute finding.  MRI brain revealed small acute to subacute infarcts in the left centrum semiovale and temporal lobe new since 06/30/2021.  Additional more faint foci of diffusion restriction in the bilateral frontal lobes described above likely reflect more late subacute to early chronic infarcts.  No acute intracranial hemorrhage.  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  Neurology was consulted by EDP.  Patient was outside of window for tPA.  TRH asked to admit for further work-up.  ED Course: Temperature 97.9.  BP 136/82, pulse 58, respiratory 17, saturation 97% on room air. K+ 2.9.  Review of Systems: Review of systems as noted in the HPI. All other systems reviewed and are negative.   Past Medical History:  Diagnosis Date   Anxiety    Arthritis    CAD (coronary artery disease)    a. 03/2013 NSTEMI/Cath: 100 RCA, EF 45%->Med Rx;  b. 12/2013 Cath/PCI: LAD 68m 30d, D1 80, D2 60, LCX nl, RCA 50ost/p, 683m95d (2.5x33 Xience DES);  c. 05/2014 Lexi MV: EF 68%, no ischemia; d. stress echo 12/2015 no ischemia @ max exercise (did  not achieve target)   Carotid disease, bilateral ([facility]   a. 08/2013 Carotid U/S: 1-39% bilat ICA stenosis.   Chronic kidney disease    Chronic kidney infections. Takes daily preventative.   Complication of anesthesia    CVA (cerebral vascular accident) ([facility]   a. 08/2013.   [place]cere[place]) [place]   Decreased libido    Depression    GERD (gastroesophageal reflux disease)    History of 2019 novel coronavirus disease (COVID-19) 07/2019   History of recurrent UTIs    Hypercholesteremia    Hyperlipemia    Hypertension    Insomnia    Ischemic cardiomyopathy    a. 03/2013 Echo: EF 30-35%, mod dil LA, mod-sev MR, mod TR;  b. 08/2013 Echo EF 60-65%, mild AI.   Menopause    Migraines    Myocardial infarction (HCC)    PONV (postoperative nausea and vomiting)    If anesthsia is given slowly, pt will not throw up, if given quickly, pt will have nausea and vomiting.   Right lower quadrant pain    Syncope and collapse    Tobacco abuse    Vaginal atrophy    Past Surgical History:  Procedure Laterality Date   CARDIAC CATHETERIZATION  01/01/2014   CARDIAC CATHETERIZATION  03/16/2013   CARDIAC CATHETERIZATION  12/2013   CARDIAC CATHETERIZATION N/A 08/19/2016   Procedure: Left Heart Cath and Coronary Angiography;  Surgeon: TiMinna MerrittsMD;  [facility];  Service: Cardiovascular;  Laterality: N/A;   CESAREAN SECTION WITH BILATERAL TUBAL LIGATION  CORONARY ANGIOPLASTY WITH STENT PLACEMENT  01/01/2014   95% lesion with a drug eluting stent to the distal RCA.   ENDOMETRIAL ABLATION     KNEE ARTHROSCOPY  11/27/2006   left knee    OOPHORECTOMY     TOTAL KNEE ARTHROPLASTY Left 10/06/2016   Procedure: TOTAL KNEE ARTHROPLASTY;  Surgeon: Corky Mull, MD;  Location: [facility];  Service: Orthopedics;  Laterality: Left;   TOTAL KNEE ARTHROPLASTY Right 11/12/2020   Procedure: TOTAL KNEE ARTHROPLASTY;  Surgeon: Corky Mull, MD;  Location: [facility];  Service:  Orthopedics;  Laterality: Right;   TUBAL LIGATION      Social History:  reports that she has been smoking cigarettes. She has a 30.00 pack-year smoking history. She has never used smokeless tobacco. She reports current alcohol use. She reports that she does not use drugs.   Allergies  Allergen Reactions   Codeine Sulfate Other (See Comments)    sick   Penicillins Nausea And Vomiting    Has patient had a PCN reaction causing immediate rash, facial/tongue/throat swelling, SOB or lightheadedness with hypotension: No Has patient had a PCN reaction causing severe rash involving mucus membranes or skin necrosis: No Has patient had a PCN reaction that required hospitalization No Has patient had a PCN reaction occurring within the last 10 years: No If all of the above answers are "NO", then may proceed with Cephalosporin use.     Family History  Problem Relation Age of Onset   Hypertension Mother    Stroke Mother    Hyperlipidemia Mother    Breast cancer Paternal Grandmother    Colon cancer Neg Hx    Ovarian cancer Neg Hx    Heart disease Neg Hx    Diabetes Neg Hx       Prior to Admission medications   Medication Sig Start Date End Date Taking? Authorizing Provider  acetaminophen (TYLENOL) 500 MG tablet Take 500 mg by mouth every 6 (six) hours as needed for moderate pain or mild pain.    [provider]  amLODipine (NORVASC) 10 MG tablet Take 1 tablet (10 mg total) by mouth daily. Patient not taking: No sig reported 04/11/21   Flora Lipps, MD  aspirin EC 81 MG EC tablet Take 1 tablet (81 mg total) by mouth daily. Swallow whole. 07/03/21   Sharen Hones, MD  FLUoxetine (PROZAC) 40 MG capsule Take 40 mg by mouth daily.    [provider]  metoprolol succinate (TOPROL-XL) 50 MG 24 hr tablet Take 50 mg by mouth daily. Take with or immediately following a meal.    [provider]  nitroGLYCERIN (NITROSTAT) 0.4 MG SL tablet Place 0.4 mg under the tongue every 5  (five) minutes as needed for chest pain.    [provider]  pantoprazole (PROTONIX) 40 MG tablet Take 1 tablet (40 mg total) by mouth at bedtime. 04/10/21   Flora Lipps, MD  rosuvastatin (CRESTOR) 20 MG tablet Take 20 mg by mouth daily.    [provider]  triamterene-hydrochlorothiazide (DYAZIDE) 37.5-25 MG capsule Take 1 capsule by mouth daily.    [provider]    Physical Exam: BP 136/61   Pulse (!) 51   Temp 98.6 F (37 C) (Oral)   Resp 16   Ht '5\' 2"'$  (1.575 m)   Wt 63 kg   SpO2 98%   BMI 25.40 kg/m   General: 65 y.o. year-old female well developed well nourished in no acute distress.  Alert and oriented  x3. Cardiovascular: Regular rate and rhythm with no rubs or gallops.  No thyromegaly or JVD noted.  No lower extremity edema. 2/4 pulses in all 4 extremities. Respiratory: Clear to auscultation with no wheezes or rales. Good inspiratory effort. Abdomen: Soft nontender nondistended with normal bowel sounds x4 quadrants. Muskuloskeletal: No cyanosis, clubbing or edema noted bilaterally Neuro: CN II-XII intact, strength, sensation, reflexes Skin: No ulcerative lesions noted or rashes Psychiatry: Judgement and insight appear normal. Mood is appropriate for condition and setting          Labs on Admission:  Basic Metabolic Panel: Recent Labs  Lab 07/24/21 0846  NA 140  K 2.9*  CL 104  CO2 26  GLUCOSE 99  BUN 14  CREATININE 0.84  CALCIUM 9.0   Liver Function Tests: Recent Labs  Lab 07/24/21 0846  AST 21  ALT 16  ALKPHOS 75  BILITOT 0.6  PROT 6.8  ALBUMIN 3.6   No results for input(s): LIPASE, AMYLASE in the last 168 hours. No results for input(s): AMMONIA in the last 168 hours. CBC: Recent Labs  Lab 07/24/21 0846  WBC 8.1  NEUTROABS 4.9  HGB 11.7*  HCT 32.0*  MCV 93.3  PLT 276   Cardiac Enzymes: No results for input(s): CKTOTAL, CKMB, CKMBINDEX, TROPONINI in the last 168 hours.  BNP (last 3 results) No results for  input(s): BNP in the last 8760 hours.  ProBNP (last 3 results) No results for input(s): PROBNP in the last 8760 hours.  CBG: No results for input(s): GLUCAP in the last 168 hours.  Radiological Exams on Admission: CT HEAD WO CONTRAST  Result Date: 07/24/2021 CLINICAL DATA:  Possible stroke.  Generalized weakness EXAM: CT HEAD WITHOUT CONTRAST TECHNIQUE: Contiguous axial images were obtained from the base of the skull through the vertex without intravenous contrast. COMPARISON:  06/30/2021 FINDINGS: Brain: No evidence of acute infarction, hemorrhage, hydrocephalus, extra-axial collection or mass lesion/mass effect.Chronic small vessel ischemia in the hemispheric white matter with chronic lacunar infarcts at the left caudate head and genu of the corpus callosum. Recent left cerebral facet recent cerebral infarcts by MRI are not detected due to small size and timing. Vascular: No hyperdense vessel or unexpected calcification. Skull: Normal. Negative for fracture or focal lesion. Sinuses/Orbits: No acute finding. IMPRESSION: 1. No acute or interval finding. 2. Chronic small vessel disease including chronic small-vessel infarcts. Electronically Signed   By: Jorje Guild M.D.   On: 07/24/2021 09:13   MR Brain Wo Contrast (neuro protocol)  Result Date: 07/24/2021 CLINICAL DATA:  Dizziness, weakness EXAM: MRI HEAD WITHOUT CONTRAST TECHNIQUE: Multiplanar, multiecho pulse sequences of the brain and surrounding structures were obtained without intravenous contrast. COMPARISON:  Same-day noncontrast CT head: Brain MRI 06/30/2021 FINDINGS: Brain: There are small foci of diffusion restriction in the left centrum semiovale/periventricular white matter and left temporal lobe with faint associated FLAIR signal abnormality. There is more faint elevated DWI signal in the right superior frontal gyrus, left paracentral lobule, and left centrum semiovale more posteriorly. There is no evidence of acute intracranial  hemorrhage or extra-axial fluid collection. There is a remote hemorrhage in the right thalamus. There are remote lacunar infarcts in the bilateral basal ganglia, the largest on the left. A curvilinear focus of SWI signal dropout in the left frontal lobe likely reflects a developmental venous anomaly. An adjacent nodular focus of SWI signal dropout may reflect a nonspecific punctate chronic microhemorrhage or small cavernoma Parenchymal volume loss and advanced chronic white matter microangiopathy are not significantly  changed. There is no mass lesion. There is no midline shift. Vascular: Normal flow voids. Skull and upper cervical spine: Normal marrow signal. Sinuses/Orbits: The imaged paranasal sinuses are clear. The globes and orbits are unremarkable. Other: None. IMPRESSION: 1. Small acute to subacute infarcts in the left centrum semiovale and temporal lobe, new since 06/30/2021. Additional more faint foci of diffusion restriction in the bilateral frontal lobes described above likely reflect more late subacute to early chronic infarcts. 2. No acute intracranial hemorrhage. 3. 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 Electronically Signed   By: Valetta Mole M.D.   On: 07/24/2021 15:43    EKG: I independently viewed the EKG done and my findings are as followed: Sinus bradycardia rate of 54.  Nonspecific ST-T changes.  QTc 385.  Assessment/Plan Present on Admission:  CVA (cerebral vascular accident) [facility])  Active Problems:   CVA (cerebral vascular accident) ([place])  Recurrent CVAs Recent CVAs as stated above Last known well last night, outside of window for tPA Admitted for further workup Neurology consulted and directing management Permissive hypertension for the first 48 hours then gradual normalization of blood pressure. PT/OT/speech assessment Fall precautions Neurology recommended aspirin and Plavix  daily  Essential hypertension BP is currently at goal Ongoing permissive hypertension Hold off home oral antihypertensives.  Hyperlipidemia Last LDL 74 from 07/02/2019 Goal LDL less than 70. Resume home statin  Hypokalemia Serum potassium 2.9 Repleted intravenously Obtain magnesium level in the morning.  Chronic diastolic CHF Last 2D echo done on 07/01/2021 showed normal LVEF 60 to 65% and grade 1 diastolic dysfunction Strict I's and O's and daily weight Closely monitor volume status while on gentle IV fluid hydration normal saline at 50 cc/h x 1 day.  Ambulatory dysfunction in the setting of acute CVA PT OT assessment Fall precautions  DVT prophylaxis: SCDs  Code Status: Full code  Family Communication: Family at bedside  Disposition Plan: Admitted to [place] unit with remote telemetry.  Consults called: Neurology  Admission status: Inpatient status.  Patient will require at least 2 midnights for further evaluation and treatment of present condition.   Status is: Inpatient    Dispo: The patient is from: Home.              Anticipated d/c is to: Home.              Patient currently not medically stable for discharge.  Not applicable.   Difficult to place patient, not applicable.       Kayleen Memos MD [facility] Pager [phone]  If 7PM-7AM, please contact night-coverage [email or web address] Password [facility]  07/24/2021, 5:15 PM

## 2021-07-25 ENCOUNTER — Encounter
Admission: EM | Disposition: A | Discharge status: Home Health | Payer: Self-pay | Source: Home / Self Care | Attending: Family Medicine

## 2021-07-25 ENCOUNTER — Inpatient Hospital Stay (HOSPITAL_COMMUNITY)
Admit: 2021-07-25 | Discharge: 2021-07-25 | Disposition: A | Discharge status: Home / Self Care | Payer: PPO | Attending: Nurse Practitioner | Admitting: Nurse Practitioner

## 2021-07-25 ENCOUNTER — Encounter: Payer: Self-pay | Admitting: Internal Medicine

## 2021-07-25 DIAGNOSIS — E782 Mixed hyperlipidemia: Secondary | ICD-10-CM

## 2021-07-25 DIAGNOSIS — I6319 Cerebral infarction due to embolism of other precerebral artery: Secondary | ICD-10-CM | POA: Diagnosis not present

## 2021-07-25 DIAGNOSIS — I10 Essential (primary) hypertension: Secondary | ICD-10-CM | POA: Diagnosis not present

## 2021-07-25 DIAGNOSIS — I639 Cerebral infarction, unspecified: Secondary | ICD-10-CM

## 2021-07-25 DIAGNOSIS — I6389 Other cerebral infarction: Secondary | ICD-10-CM | POA: Diagnosis not present

## 2021-07-25 DIAGNOSIS — H814 Vertigo of central origin: Secondary | ICD-10-CM

## 2021-07-25 HISTORY — PX: TEE WITHOUT CARDIOVERSION: SHX5443

## 2021-07-25 LAB — CBC
HCT: 32.6 % — ABNORMAL LOW (ref 36.0–46.0)
Hemoglobin: 11.4 g/dL — ABNORMAL LOW (ref 12.0–15.0)
MCH: 33.3 pg (ref 26.0–34.0)
MCHC: 35 g/dL (ref 30.0–36.0)
MCV: 95.3 fL (ref 80.0–100.0)
Platelets: 272 10*3/uL (ref 150–400)
RBC: 3.42 MIL/uL — ABNORMAL LOW (ref 3.87–5.11)
RDW: 13.8 % (ref 11.5–15.5)
WBC: 8.6 10*3/uL (ref 4.0–10.5)
nRBC: 0 % (ref 0.0–0.2)

## 2021-07-25 LAB — PHOSPHORUS: Phosphorus: 4.2 mg/dL (ref 2.5–4.6)

## 2021-07-25 LAB — BASIC METABOLIC PANEL
Anion gap: 6 (ref 5–15)
BUN: 19 mg/dL (ref 8–23)
CO2: 26 mmol/L (ref 22–32)
Calcium: 8.9 mg/dL (ref 8.9–10.3)
Chloride: 109 mmol/L (ref 98–111)
Creatinine, Ser: 0.75 mg/dL (ref 0.44–1.00)
GFR, Estimated: >60 mL/min (ref >60)
Glucose, Bld: 90 mg/dL (ref 70–99)
Potassium: 3.3 mmol/L — ABNORMAL LOW (ref 3.5–5.1)
Sodium: 141 mmol/L (ref 135–145)

## 2021-07-25 LAB — MAGNESIUM: Magnesium: 2.1 mg/dL (ref 1.7–2.4)

## 2021-07-25 SURGERY — ECHOCARDIOGRAM, TRANSESOPHAGEAL
Anesthesia: Choice

## 2021-07-25 MED ORDER — GLUCERNA SHAKE PO LIQD
237.0000 mL | Freq: Two times a day (BID) | ORAL | Status: DC
  Administered 2021-07-25: 15:00:00 237 mL via ORAL

## 2021-07-25 MED ORDER — LIDOCAINE VISCOUS HCL 2 % MT SOLN
OROMUCOSAL | Status: AC
  Filled 2021-07-25: qty 15

## 2021-07-25 MED ORDER — MIDAZOLAM HCL 2 MG/2ML IJ SOLN
INTRAMUSCULAR | Status: DC | PRN
  Administered 2021-07-25 (×3): 1 mg via INTRAVENOUS

## 2021-07-25 MED ORDER — FENTANYL CITRATE (PF) 100 MCG/2ML IJ SOLN
INTRAMUSCULAR | Status: AC
  Filled 2021-07-25: qty 2

## 2021-07-25 MED ORDER — SODIUM CHLORIDE FLUSH 0.9 % IV SOLN
INTRAVENOUS | Status: AC
  Filled 2021-07-25: qty 10

## 2021-07-25 MED ORDER — POTASSIUM CHLORIDE 20 MEQ PO PACK
40.0000 meq | PACK | Freq: Once | ORAL | Status: AC
  Administered 2021-07-25: 06:00:00 40 meq via ORAL
  Filled 2021-07-25: qty 2

## 2021-07-25 MED ORDER — SODIUM CHLORIDE 0.9 % IV SOLN: INTRAVENOUS | Status: DC

## 2021-07-25 MED ORDER — FENTANYL CITRATE (PF) 100 MCG/2ML IJ SOLN
INTRAMUSCULAR | Status: DC | PRN
  Administered 2021-07-25 (×3): 25 ug via INTRAVENOUS

## 2021-07-25 MED ORDER — MIDAZOLAM HCL 2 MG/2ML IJ SOLN
INTRAMUSCULAR | Status: AC
  Filled 2021-07-25: qty 4

## 2021-07-25 MED ORDER — MIDAZOLAM HCL 2 MG/2ML IJ SOLN
INTRAMUSCULAR | Status: AC
  Filled 2021-07-25: qty 2

## 2021-07-25 MED ORDER — BUTAMBEN-TETRACAINE-BENZOCAINE 2-2-14 % EX AERO
INHALATION_SPRAY | CUTANEOUS | Status: AC
  Filled 2021-07-25: qty 5

## 2021-07-25 NOTE — Progress Notes (Signed)
Initial Nutrition Assessment  DOCUMENTATION CODES:  Not applicable  INTERVENTION:  Continue current diet as ordered, monitor intake trends to determine if liberalization is needed Glucerna Shake po BID, each supplement provides 220 kcal and 10 grams of protein  NUTRITION DIAGNOSIS:  Inadequate oral intake related to decreased appetite as evidenced by per patient/family report.  GOAL:  Patient will meet greater than or equal to 90% of their needs  MONITOR:  PO intake, Supplement acceptance  REASON FOR ASSESSMENT:  Malnutrition Screening Tool    ASSESSMENT:  65 y.o. female with hx of HTN, CAD, ischemic cardiomyopathy, hx CVA, Hx MI, HLD, and GERD presented to ED with concerns for a possible stroke.  Attempted to see pt, out of room for procedure at the time of visit. Undergoing stroke work-up per Neurology and TEE ordered.    Reported weight loss and poor appetite PTA on admission screen. Reviewed weight hx, minor weight loss of 3.5% noted in the last 6 months. No recorded intake this admission as pt was NPO this AM. Will continue to monitor and will add nutrition supplement in the meantime to augment intake.   Nutritionally Relevant Medications: Scheduled Meds:  rosuvastatin  20 mg Oral Daily   Continuous Infusions:  0.9 % NaCl with KCl 40 mEq / L 50 mL/hr at 07/25/21 0056   PRN Meds: ondansetron, polyethylene glycol  Labs Reviewed: K 3.3  NUTRITION - FOCUSED PHYSICAL EXAM: Defer to in-person assessment  Diet Order:   Diet Order     None       EDUCATION NEEDS:  No education needs have been identified at this time  Skin:  Skin Assessment: Reviewed RN Assessment  Last BM:  9/15  Height:  Ht Readings from Last 1 Encounters:  07/25/21 '5\' 2"'$  (1.575 m)   Weight:  Wt Readings from Last 1 Encounters:  07/25/21 59.9 kg   Ideal Body Weight:  50 kg  BMI:  Body mass index is 24.14 kg/m.  Estimated Nutritional Needs:  Kcal:  1500-1700 kcal/d Protein:  75-85  g/d Fluid:  1.5-1.8 L/d   Ranell Patrick, RD, LDN Clinical Dietitian Pager on Amion

## 2021-07-25 NOTE — Progress Notes (Signed)
Chaplain Maggie completed Scientist, physiological education. Please contact on call chaplain if/when pt desires to sign document with notary and witnesses.

## 2021-07-25 NOTE — Progress Notes (Signed)
PROGRESS NOTE    [name]  [id number] DOB: May 07, 1956 DOA: 07/24/2021 PCP: Baxter Hire, MD   Brief Narrative: This 65 years old female with PMH significant for recent CVA, acute right thalamic ICH, coronary artery disease s/p PCI with stent, bilateral carotid disease, GERD, hyperlipidemia, hypertension, ischemic cardiomyopathy, tobacco use disorder presented in the ED after waking up in the morning with severe vertigo associated with nausea and inability to ambulate.  Patient was last known well was last night.  Patient was brought in the ED initial CT head shows no acute findings,  MRI brain revealed small acute to subacute infarct in the left temporal lobe new since 8/22.  No acute intracranial hemorrhage.  Patient was deemed out of tPA window.  Patient is admitted for stroke work-up.  Assessment & Plan:   Active Problems:   CVA (cerebral vascular accident) ([place])  Acute CVA, recurrent : Patient presented with vertigo, nausea, inability to ambulate after she wakes up.  Last known well was last night ,  out of tPA window. Patient seen by neurology,  recommended stroke work-up. CT head negative, MRI consistent with new stroke. Frequent neurochecks, permissive hypertension for 48 hours. Continue aspirin and Plavix daily, statins. Check hemoglobin A1c, TSH, LDL. PT and OT evaluation.  Fall precautions. Neurology recommended cardio evaluation for possible TEE. Cardiology consulted,  awaiting recommendation.  Essential hypertension: Blood pressure currently at goal. Continue permissive hypertension, gradual normalization of BP  Hyperlipidemia:  LDL less than 70 Resume home statin.  Hypokalemia: Replaced, continue to monitor  Chronic diastolic CHF: Last 2D echo 07/01/2021: LVEF 60 to [id number] grade 1 diastolic dysfunction. Closely monitor volume status since being on gentle IV hydration.  Ambulatory dysfunction in the setting of acute CVA Fall precautions, PT and OT  evaluation.  DVT prophylaxis: SCDs Code Status: Full code. Family Communication:  None at bed side. Disposition Plan:  Status is: Inpatient  Remains inpatient appropriate because:Inpatient level of care appropriate due to severity of illness  Dispo:  Patient From: Home  Planned Disposition: Home with Health Care Svc  Medically stable for discharge: No     Consultants:  Neurology  Procedures: CT head, MRI, echo. Antimicrobials:   Anti-infectives (From admission, onward)    None        Subjective: Patient was seen and examined at bedside.  Overnight events noted.   Patient reports feeling much improved. She still reports feeling dizzy and nauseous but denies any headache.  Objective: Vitals:   07/24/21 2126 07/24/21 2312 07/25/21 0608 07/25/21 0836  BP:  (!) 146/77 (!) 148/82 (!) 148/71  Pulse:  65 (!) 51 (!) 53  Resp:  '18 17 15  '$ Temp: 97.9 F (36.6 C) 98.2 F (36.8 C) 98.1 F (36.7 C) 97.9 F (36.6 C)  TempSrc: Oral     SpO2:  96% 98% 100%  Weight:      Height:        Intake/Output Summary (Last 24 hours) at 07/25/2021 1040 Last data filed at 07/25/2021 1017 Gross per 24 hour  Intake 240 ml  Output --  Net 240 ml   Filed Weights   07/24/21 0848  Weight: 63 kg    Examination:  General exam: Appears comfortable, not in any acute distress. Respiratory system: Clear to auscultation bilaterally, no wheezing. Cardiovascular system: S1-S2 heard, regular rate and rhythm, no murmur.   Gastrointestinal system: Abdomen is soft, nontender, nondistended, BS +. Central nervous system: Alert and oriented x 3 . No focal neurological  deficits. Extremities: No edema, no cyanosis, no clubbing. Skin: No rashes, lesions or ulcers Psychiatry: Judgement and insight appear normal. Mood & affect appropriate.     Data Reviewed: I have personally reviewed following labs and imaging studies  CBC: Recent Labs  Lab 07/24/21 0846 07/25/21 0624  WBC 8.1 8.6  NEUTROABS  4.9  --   HGB 11.7* 11.4*  HCT 32.0* 32.6*  MCV 93.3 95.3  PLT 276 [id number]   Basic Metabolic Panel: Recent Labs  Lab 07/24/21 0846 07/25/21 0624  NA 140 141  K 2.9* 3.3*  CL 104 109  CO2 26 26  GLUCOSE 99 90  BUN 14 19  CREATININE 0.84 0.75  CALCIUM 9.0 8.9  MG  --  2.1  PHOS  --  4.2   GFR: Estimated Creatinine Clearance: 62 mL/min (by C-G formula based on SCr of 0.75 mg/dL). Liver Function Tests: Recent Labs  Lab 07/24/21 0846  AST 21  ALT 16  ALKPHOS 75  BILITOT 0.6  PROT 6.8  ALBUMIN 3.6   No results for input(s): LIPASE, AMYLASE in the last 168 hours. No results for input(s): AMMONIA in the last 168 hours. Coagulation Profile: Recent Labs  Lab 07/24/21 0846  INR 1.1   Cardiac Enzymes: No results for input(s): CKTOTAL, CKMB, CKMBINDEX, TROPONINI in the last 168 hours. BNP (last 3 results) No results for input(s): PROBNP in the last 8760 hours. HbA1C: No results for input(s): HGBA1C in the last 72 hours. CBG: No results for input(s): GLUCAP in the last 168 hours. Lipid Profile: No results for input(s): CHOL, HDL, LDLCALC, TRIG, CHOLHDL, LDLDIRECT in the last 72 hours. Thyroid Function Tests: No results for input(s): TSH, T4TOTAL, FREET4, T3FREE, THYROIDAB in the last 72 hours. Anemia Panel: No results for input(s): VITAMINB12, FOLATE, FERRITIN, TIBC, IRON, RETICCTPCT in the last 72 hours. Sepsis Labs: No results for input(s): PROCALCITON, LATICACIDVEN in the last 168 hours.  No results found for this or any previous visit (from the past 240 hour(s)).       Radiology Studies: CT ANGIO HEAD NECK W WO CM  Result Date: 07/24/2021 CLINICAL DATA:  Stroke/TIA. Generalized weakness. Dizziness. Left hemispheric infarcts by MRI. EXAM: CT ANGIOGRAPHY HEAD AND NECK TECHNIQUE: Multidetector CT imaging of the head and neck was performed using the standard protocol during bolus administration of intravenous contrast. Multiplanar CT image reconstructions and MIPs  were obtained to evaluate the vascular anatomy. Carotid stenosis measurements (when applicable) are obtained utilizing NASCET criteria, using the distal internal carotid diameter as the denominator. CONTRAST:  47m OMNIPAQUE IOHEXOL 350 MG/ML SOLN COMPARISON:  CT and MRI studies same day. Previous CT angiography 07/01/2021. FINDINGS: CTA NECK FINDINGS Aortic arch: Normal. No atherosclerotic change. Branching pattern is normal. Right carotid system: Common carotid artery widely patent to the bifurcation. Carotid bifurcation shows mild soft and calcified plaque but no stenosis. Cervical ICA widely patent. Left carotid system: Common carotid artery widely patent to the bifurcation. Carotid bifurcation shows soft and calcified plaque but no stenosis compared to the diameter of the distal ICA. Vertebral arteries: Both vertebral artery origins are widely patent. Both vertebral arteries appear normal through the cervical region to the foramen magnum. Skeleton: Ordinary mild spondylosis. Other neck: No mass or lymphadenopathy.  Normal Upper chest: Normal Review of the MIP images confirms the above findings CTA HEAD FINDINGS Anterior circulation: Both internal carotid arteries are patent through the skull base and siphon regions. The anterior and middle cerebral vessels are patent and normal. No branch vessel  occlusion or stenosis. No aneurysm or vascular malformation. Mild atherosclerotic irregularity of the more distal branch vessels. Posterior circulation: Both vertebral arteries are widely patent through the foramen magnum to the basilar. No basilar stenosis. Posterior circulation branch vessels are normal. Mild atherosclerotic irregularity of the PCA vessels. Venous sinuses: Patent and normal. Anatomic variants: None significant. Review of the MIP images confirms the above findings IMPRESSION: No intracranial large or medium vessel occlusion or correctable proximal stenosis. Some atherosclerotic irregularity of the more  distal intracranial branch vessels. Mild atherosclerotic disease at both carotid bifurcations but no stenosis. Electronically Signed   By: Nelson Chimes M.D.   On: 07/24/2021 20:11   CT HEAD WO CONTRAST  Result Date: 07/24/2021 CLINICAL DATA:  Possible stroke.  Generalized weakness EXAM: CT HEAD WITHOUT CONTRAST TECHNIQUE: Contiguous axial images were obtained from the base of the skull through the vertex without intravenous contrast. COMPARISON:  06/30/2021 FINDINGS: Brain: No evidence of acute infarction, hemorrhage, hydrocephalus, extra-axial collection or mass lesion/mass effect.Chronic small vessel ischemia in the hemispheric white matter with chronic lacunar infarcts at the left caudate head and genu of the corpus callosum. Recent left cerebral facet recent cerebral infarcts by MRI are not detected due to small size and timing. Vascular: No hyperdense vessel or unexpected calcification. Skull: Normal. Negative for fracture or focal lesion. Sinuses/Orbits: No acute finding. IMPRESSION: 1. No acute or interval finding. 2. Chronic small vessel disease including chronic small-vessel infarcts. Electronically Signed   By: Jorje Guild M.D.   On: 07/24/2021 09:13   MR Brain Wo Contrast (neuro protocol)  Result Date: 07/24/2021 CLINICAL DATA:  Dizziness, weakness EXAM: MRI HEAD WITHOUT CONTRAST TECHNIQUE: Multiplanar, multiecho pulse sequences of the brain and surrounding structures were obtained without intravenous contrast. COMPARISON:  Same-day noncontrast CT head: Brain MRI 06/30/2021 FINDINGS: Brain: There are small foci of diffusion restriction in the left centrum semiovale/periventricular white matter and left temporal lobe with faint associated FLAIR signal abnormality. There is more faint elevated DWI signal in the right superior frontal gyrus, left paracentral lobule, and left centrum semiovale more posteriorly. There is no evidence of acute intracranial hemorrhage or extra-axial fluid collection.  There is a remote hemorrhage in the right thalamus. There are remote lacunar infarcts in the bilateral basal ganglia, the largest on the left. A curvilinear focus of SWI signal dropout in the left frontal lobe likely reflects a developmental venous anomaly. An adjacent nodular focus of SWI signal dropout may reflect a nonspecific punctate chronic microhemorrhage or small cavernoma Parenchymal volume loss and advanced chronic white matter microangiopathy are not significantly changed. There is no mass lesion. There is no midline shift. Vascular: Normal flow voids. Skull and upper cervical spine: Normal marrow signal. Sinuses/Orbits: The imaged paranasal sinuses are clear. The globes and orbits are unremarkable. Other: None. IMPRESSION: 1. Small acute to subacute infarcts in the left centrum semiovale and temporal lobe, new since 06/30/2021. Additional more faint foci of diffusion restriction in the bilateral frontal lobes described above likely reflect more late subacute to early chronic infarcts. 2. No acute intracranial hemorrhage. 3. 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 Electronically Signed   By: Valetta Mole M.D.   On: 07/24/2021 15:43     Scheduled Meds:  aspirin EC  81 mg Oral Daily   clopidogrel  75 mg Oral Daily   influenza vac split quadrivalent PF  0.5 mL Intramuscular Tomorrow-1000   rosuvastatin  20 mg Oral Daily  Continuous Infusions:  0.9 % NaCl with KCl 40 mEq / L 50 mL/hr at 07/25/21 0056     LOS: 1 day    Time spent: 35 mins    Dequan Kindred, MD [facility]   If 7PM-7AM, please contact night-coverage

## 2021-07-25 NOTE — TOC Initial Note (Signed)
Transition of Care [facility]) - Initial/Assessment Note    Patient Details  Name: [name] MRN: [id number] Date of Birth: 07-May-1956  [facility]) CM/SW Contact:    Pete Pelt, RN Phone Number: 07/25/2021, 11:01 AM  Clinical Narrative:    Patient has [place] consult.  Attempted to see patient, out of room for testing/procedure.  TOC to follow                     Patient Goals and CMS Choice        Expected Discharge Plan and Services                                                Prior Living Arrangements/Services                       Activities of Daily Living Home Assistive Devices/Equipment: None ADL Screening (condition at time of admission) Patient's cognitive ability adequate to safely complete daily activities?: Yes Is the patient deaf or have difficulty hearing?: No Does the patient have difficulty seeing, even when wearing glasses/contacts?: No Does the patient have difficulty concentrating, remembering, or making decisions?: No Patient able to express need for assistance with ADLs?: Yes Does the patient have difficulty dressing or bathing?: No Independently performs ADLs?: Yes (appropriate for developmental age) Does the patient have difficulty walking or climbing stairs?: Yes Weakness of Legs: Both Weakness of Arms/Hands: None  Permission Sought/Granted                  Emotional Assessment              Admission diagnosis:  Vertigo of central origin [H81.4] CVA (cerebral vascular accident) ([place]) [I63.9] Cerebrovascular accident (CVA), unspecified mechanism ([place]) [I63.9] Patient Active Problem List   Diagnosis Date Noted   CVA (cerebral vascular accident) ([place]) 06/30/2021   Thalamic hemorrhage ([place]) 04/10/2021   ICH (intracerebral hemorrhage) ([place]) 04/10/2021   Pelvic pain-right lower quadrant 07/14/2018   Right lower quadrant abdominal pain 06/30/2018   Bilateral carotid artery stenosis 03/12/2018    Chronic systolic CHF (congestive heart failure) ([place]) 03/12/2018   Status post total knee replacement using cement, left 10/06/2016   History of coronary artery stent placement    Pure hypercholesterolemia    Recurrent UTI 02/11/2016   Menopause 02/11/2016   Angina pectoris ([place]) 01/07/2016   Syncope 07/11/2015   Orthostatic hypotension 02/19/2015   Dehydration 02/19/2015   Hypokalemia 02/19/2015   Stroke ([place]) 02/18/2015   Tobacco abuse 02/18/2015   History of stroke 07/18/2014   Vertigo 07/18/2014   Bilateral wrist pain 07/18/2014   Atypical chest pain 06/05/2014   Coronary artery disease of native artery of native heart with stable angina pectoris ([place]) 01/19/2014   Carotid arterial disease ([place]) 01/19/2014   Depression 01/16/2014   Memory loss 01/16/2014   Right sided weakness 08/10/2013   Essential hypertension 08/10/2013   Mixed hyperlipidemia 08/10/2013   History of MI (myocardial infarction) 08/10/2013   Stroke, acute, embolic ([place]) [id number]   PCP:  Baxter Hire, MD Pharmacy:   [facility] [id number] [address], [address] AT [facility] OF [place] [address] Phone: [phone] Fax: [phone]  [facility]) - [place], [place] -  [place] [address] Phone: [phone] Fax: [phone]  [id number] - [place], [place] - [place] AT [address] [employer] [place] [address] Phone: [phone] Fax: [phone]     Social Determinants of Health (SDOH) Interventions    Readmission Risk Interventions No flowsheet data found.

## 2021-07-25 NOTE — Consult Note (Signed)
Cardiology Consultation:   Patient ID: [name] MRN: [id number]; DOB: 1956/01/15  Admit date: 07/24/2021 Date of Consult: 07/25/2021  PCP:  [name] Hire, MD   [facility] Providers Cardiologist:  [name] Rogue, MD   { Physician requesting consult Dr. Dwyane [name] Reason for consult: Stroke, need for TEE   Patient Profile:   [name] is a 65 y.o. female with a hx of coronary artery disease, prior stent to her distal RCA, prior stroke October 2014 presenting with acute stroke.  Cardiology consulted for evaluation of her stroke, TEE  History of Present Illness:    [name] is a 65 year old woman known to the [facility] cardiology clinic with long history of  smoking who stopped in May 2014,  coronary artery disease with non-ST elevation in may 2015 with stent placed to her distal RCA,  stroke October 2014 evaluated at [facility].  Previous anginal symptoms included back pain, chest pain like an elephant sitting on her chest cardiac catheterization performed for anginal symptoms, Cath 08/19/2016:, No intervention performed  She presents for acute stroke symptoms weakness on the left  Reports that yesterday morning she woke up 4 AM to go to the bathroom got out of bed and fell down," must have fallen down 10 times". Got her partner to help her to the bathroom and back to bed, he went to work Had vertigo, nausea, EMS activated CT scan head in the emergency room no acute findings MRI brain small acute to subacute infarcts new compared to June 30, 2021 Outside the window for tPA Potassium 2.9 She reports feeling close to her baseline, feels that she has recovered full left side strength leg and arm She does report recent fall and injury to left hand, still some residual weakness from trauma of fall Passed OT PT evaluation  Other past medical history reviewed covid positive sept 2020   cardiac catheterization  08/19/2016: Left anterior descending (LAD):30% proximal LAD  disease, 40% mid LAD disease. 70% ostial and proximal disease of the D2 and D3 vessel Right coronary artery (RCA):40 to 50% ostial disease, 30% proximal disease.  Left ventriculography: Left ventricular systolic function is normal, LVEF is estimated at 55-65%, there is no significant mitral regurgitation , no significant aortic valve stenosis  Stable mild to moderate LAD disease Moderate to severe ostial and proximal diagonal disease (unchanged or improved from previous study) Mild to Moderate ostial RCA disease, mild proximal RCA disease Normal EF No intervention performed   she was noted to have some coronary spasm that resolved with nitroglycerin    hospitalization 12/31/2013 where she presented with angina. He had a catheterization with distal RCA stent placed.  Also started on ranexa, continued on isosorbide.    Previous Cardiac catheterization report 01/01/2014 details 95% distal RCA lesion with stent placed. Also had 40% mid LAD, 30% distal LAD, 80% D1 disease, 60% D2 disease, 50% ostial RCA disease, 50% proximal RCA disease, 60% mid RCA disease stent placed was a xience  EX 2.5 millimeter stent   Cardiac catheterization in 03/16/2013 details occluded mid RCA with collaterals from distal LAD also 75% stenosis diagonal #1 near the ostium Ejection fraction at that time was 46% notes also detailed 40% mid LAD, 30% distal LAD, 50% D2 disease, 30% proximal circumflex, 75% proximal RCA disease   Past Medical History:  Diagnosis Date   Anxiety    Arthritis    CAD (coronary artery disease)    a. 03/2013 NSTEMI/Cath: 100 RCA, EF 45%->Med Rx;  b. 12/2013 Cath/PCI: LAD 49m 30d, D1 80, D2 60, LCX nl, RCA 50ost/p, 676m95d (2.5x33 Xience DES);  c. 05/2014 Lexi MV: EF 68%, no ischemia; d. stress echo 12/2015 no ischemia @ max exercise (did not achieve target)   Car[place] disease, bilateral ([facility]   a. 08/2013 Carotid U/S: 1-39% bilat ICA stenosis.   Chronic kidney disease    Chronic kidney  infections. Takes daily preventative.   Complication of anesthesia    CVA (cerebral vascular accident) ([facility]   a. 08/2013.   CV[place]cere[place] vascular accident) ([facility]   Decreased libido    Depression    GERD (gastroesophageal reflux disease)    History of 2019 novel coronavirus disease (COVID-19) 07/2019   History of recurrent UTIs    Hypercholesteremia    Hyperlipemia    Hypertension    Insomnia    Ischemic cardiomyopathy    a. 03/2013 Echo: EF 30-35%, mod dil LA, mod-sev MR, mod TR;  b. 08/2013 Echo EF 60-65%, mild AI.   Menopause    Migraines    Myocardial infarction (HCC)    PONV (postoperative nausea and vomiting)    If anesthsia is given slowly, pt will not throw up, if given quickly, pt will have nausea and vomiting.   Right lower quadrant pain    Syncope and collapse    Tobacco abuse    Vaginal atrophy     Past Surgical History:  Procedure Laterality Date   CARDIAC CATHETERIZATION  01/01/2014   CARDIAC CATHETERIZATION  03/16/2013   CARDIAC CATHETERIZATION  12/2013   CARDIAC CATHETERIZATION N/A 08/19/2016   Procedure: Left Heart Cath and Coronary Angiography;  Surgeon: TiMinna MerrittsMD;  [facility];  Service: Cardiovascular;  Laterality: N/A;   CESAREAN SECTION WITH BILATERAL TUBAL LIGATION     CORONARY ANGIOPLASTY WITH STENT PLACEMENT  01/01/2014   95% lesion with a drug eluting stent to the distal RCA.   ENDOMETRIAL ABLATION     KNEE ARTHROSCOPY  11/27/2006   left knee    OOPHORECTOMY     TOTAL KNEE ARTHROPLASTY Left 10/06/2016   Procedure: TOTAL KNEE ARTHROPLASTY;  Surgeon: JoCorky MullMD;  Location: [facility] ORS;  Service: Orthopedics;  Laterality: Left;   TOTAL KNEE ARTHROPLASTY Right 11/12/2020   Procedure: TOTAL KNEE ARTHROPLASTY;  Surgeon: PoCorky MullMD;  Location: [facility] ORS;  Service: Orthopedics;  Laterality: Right;   TUBAL LIGATION       Home Medications:  Prior to Admission medications   Medication Sig Start Date End Date Taking?  Authorizing Provider  aspirin EC 81 MG EC tablet Take 1 tablet (81 mg total) by mouth daily. Swallow whole. 07/03/21  Yes ZhSharen HonesMD  furosemide (LASIX) 20 MG tablet Take 20 mg by mouth daily.   Yes [provider]  metoprolol succinate (TOPROL-XL) 50 MG 24 hr tablet Take 50 mg by mouth daily. Take with or immediately following a meal.   Yes [provider]  rosuvastatin (CRESTOR) 20 MG tablet Take 20 mg by mouth daily.   Yes [provider]  acetaminophen (TYLENOL) 500 MG tablet Take 500 mg by mouth every 6 (six) hours as needed for moderate pain or mild pain.    [provider]  amLODipine (NORVASC) 10 MG tablet Take 1 tablet (10 mg total) by mouth daily. Patient not taking: No sig reported 04/11/21   KaFlora LippsMD  FLUoxetine (PROZAC) 40 MG capsule Take 40 mg by mouth daily. Patient  not taking: Reported on 07/24/2021    [provider]  nitroGLYCERIN (NITROSTAT) 0.4 MG SL tablet Place 0.4 mg under the tongue every 5 (five) minutes as needed for chest pain.    [provider]  pantoprazole (PROTONIX) 40 MG tablet Take 1 tablet (40 mg total) by mouth at bedtime. Patient not taking: Reported on 07/24/2021 04/10/21   Flora Lipps, MD  triamterene-hydrochlorothiazide (DYAZIDE) 37.5-25 MG capsule Take 1 capsule by mouth daily. Patient not taking: Reported on 07/24/2021    [provider]    Inpatient Medications: Scheduled Meds:  aspirin EC  81 mg Oral Daily   clopidogrel  75 mg Oral Daily   influenza vac split quadrivalent PF  0.5 mL Intramuscular Tomorrow-1000   rosuvastatin  20 mg Oral Daily   Continuous Infusions:  sodium chloride     0.9 % NaCl with KCl 40 mEq / L 50 mL/hr at 07/25/21 0056   PRN Meds: acetaminophen, melatonin, ondansetron (ZOFRAN) IV, polyethylene glycol  Allergies:    Allergies  Allergen Reactions   Codeine Sulfate Other (See Comments)    sick   Penicillins Nausea And Vomiting    Has patient had a  PCN reaction causing immediate rash, facial/tongue/throat swelling, SOB or lightheadedness with hypotension: No Has patient had a PCN reaction causing severe rash involving mucus membranes or skin necrosis: No Has patient had a PCN reaction that required hospitalization No Has patient had a PCN reaction occurring within the last 10 years: No If all of the above answers are "NO", then may proceed with Cephalosporin use.     Social History:   Social History   Socioeconomic History   Marital status: Single    Spouse name: Not on file   Number of children: 1   Years of education: Not on file   Highest education level: Not on file  Occupational History   Occupation: Firefighter: [employer]  Tobacco Use   Smoking status: Every Day    Packs/day: 1.50    Years: 20.00    Pack years: 30.00    Types: Cigarettes    Last attempt to quit: 04/07/2013    Years since quitting: 8.3   Smokeless tobacco: Never  Vaping Use   Vaping Use: Never used  Substance and Sexual Activity   Alcohol use: Yes    Comment: 1 glass of wine a month   Drug use: No   Sexual activity: Not Currently    Birth control/protection: None  Other Topics Concern   Not on file  Social History Narrative   Lives alone, not compliant w/ med regimen   Social Determinants of Health   Financial Resource Strain: Not on file  Food Insecurity: Not on file  Transportation Needs: Not on file  Physical Activity: Not on file  Stress: Not on file  Social Connections: Not on file  Intimate Partner Violence: Not on file    Family History:    Family History  Problem Relation Age of Onset   Hypertension Mother    Stroke Mother    Hyperlipidemia Mother    Breast cancer Paternal Grandmother    Colon cancer Neg Hx    Ovarian cancer Neg Hx    Heart disease Neg Hx    Diabetes Neg Hx      ROS:  Please see the history of present illness.  Review of Systems  Constitutional: Negative.   HENT:  Negative.    Respiratory: Negative.  Cardiovascular: Negative.   Gastrointestinal: Negative.   Musculoskeletal: Negative.        Left hand weakness  Neurological: Negative.   Psychiatric/Behavioral: Negative.    All other systems reviewed and are negative.   Physical Exam/Data:   Vitals:   07/24/21 2126 07/24/21 2312 07/25/21 0608 07/25/21 0836  BP:  (!) 146/77 (!) 148/82 (!) 148/71  Pulse:  65 (!) 51 (!) 53  Resp:  '18 17 15  '$ Temp: 97.9 F (36.6 C) 98.2 F (36.8 C) 98.1 F (36.7 C) 97.9 F (36.6 C)  TempSrc: Oral     SpO2:  96% 98% 100%  Weight:      Height:        Intake/Output Summary (Last 24 hours) at 07/25/2021 1053 Last data filed at 07/25/2021 1017 Gross per 24 hour  Intake 240 ml  Output --  Net 240 ml   Last 3 Weights 07/24/2021 07/01/2021 06/30/2021  Weight (lbs) 138 lb 14.2 oz 137 lb 1.6 oz 146 lb 2.6 oz  Weight (kg) 63 kg 62.188 kg 66.3 kg     Body mass index is 25.4 kg/m.  General:  Well nourished, well developed, in no acute distress HEENT: normal Neck: no JVD Vascular: No carotid bruits; Distal pulses 2+ bilaterally Cardiac:  normal S1, S2; RRR; no murmur  Lungs:  clear to auscultation bilaterally, no wheezing, rhonchi or rales  Abd: soft, nontender, no hepatomegaly  Ext: no edema Musculoskeletal:  No deformities, BUE and BLE strength normal and equal Skin: warm and dry  Neuro:  CNs 2-12 intact, no focal abnormalities noted Psych:  Normal affect   EKG:  The EKG was personally reviewed and demonstrates:   Normal sinus rhythm rate 54 bpm nonspecific T wave abnormality  Telemetry:  Telemetry was personally reviewed and demonstrates:   Normal sinus rhythm  Relevant CV Studies: TEE pending  Laboratory Data:  High Sensitivity Troponin:   Recent Labs  Lab 06/30/21 1139 06/30/21 1501  TROPONINIHS 10 12     Chemistry Recent Labs  Lab 07/24/21 0846 07/25/21 0624  NA 140 141  K 2.9* 3.3*  CL 104 109  CO2 26 26  GLUCOSE 99 90  BUN 14  19  CREATININE 0.84 0.75  CALCIUM 9.0 8.9  MG  --  2.1  GFRNONAA >60 >60  ANIONGAP 10 6    Recent Labs  Lab 07/24/21 0846  PROT 6.8  ALBUMIN 3.6  AST 21  ALT 16  ALKPHOS 75  BILITOT 0.6   Lipids No results for input(s): CHOL, TRIG, HDL, LABVLDL, LDLCALC, CHOLHDL in the last 168 hours.  Hematology Recent Labs  Lab 07/24/21 0846 07/25/21 0624  WBC 8.1 8.6  RBC 3.43* 3.42*  HGB 11.7* 11.4*  HCT 32.0* 32.6*  MCV 93.3 95.3  MCH 34.1* 33.3  MCHC 36.6* 35.0  RDW 13.6 13.8  PLT 276 272   Thyroid No results for input(s): TSH, FREET4 in the last 168 hours.  BNPNo results for input(s): BNP, PROBNP in the last 168 hours.  DDimer No results for input(s): DDIMER in the last 168 hours.   Radiology/Studies:  CT ANGIO HEAD NECK W WO CM  Result Date: 07/24/2021 CLINICAL DATA:  Stroke/TIA. Generalized weakness. Dizziness. Left hemispheric infarcts by MRI. EXAM: CT ANGIOGRAPHY HEAD AND NECK TECHNIQUE: Multidetector CT imaging of the head and neck was performed using the standard protocol during bolus administration of intravenous contrast. Multiplanar CT image reconstructions and MIPs were obtained to evaluate the vascular anatomy. Carotid stenosis measurements (when  applicable) are obtained utilizing NASCET criteria, using the distal internal carotid diameter as the denominator. CONTRAST:  37m OMNIPAQUE IOHEXOL 350 MG/ML SOLN COMPARISON:  CT and MRI studies same day. Previous CT angiography 07/01/2021. FINDINGS: CTA NECK FINDINGS Aortic arch: Normal. No atherosclerotic change. Branching pattern is normal. Right carotid system: Common carotid artery widely patent to the bifurcation. Carotid bifurcation shows mild soft and calcified plaque but no stenosis. Cervical ICA widely patent. Left carotid system: Common carotid artery widely patent to the bifurcation. Carotid bifurcation shows soft and calcified plaque but no stenosis compared to the diameter of the distal ICA. Vertebral arteries: Both  vertebral artery origins are widely patent. Both vertebral arteries appear normal through the cervical region to the foramen magnum. Skeleton: Ordinary mild spondylosis. Other neck: No mass or lymphadenopathy.  Normal Upper chest: Normal Review of the MIP images confirms the above findings CTA HEAD FINDINGS Anterior circulation: Both internal carotid arteries are patent through the skull base and siphon regions. The anterior and middle cerebral vessels are patent and normal. No branch vessel occlusion or stenosis. No aneurysm or vascular malformation. Mild atherosclerotic irregularity of the more distal branch vessels. Posterior circulation: Both vertebral arteries are widely patent through the foramen magnum to the basilar. No basilar stenosis. Posterior circulation branch vessels are normal. Mild atherosclerotic irregularity of the PCA vessels. Venous sinuses: Patent and normal. Anatomic variants: None significant. Review of the MIP images confirms the above findings IMPRESSION: No intracranial large or medium vessel occlusion or correctable proximal stenosis. Some atherosclerotic irregularity of the more distal intracranial branch vessels. Mild atherosclerotic disease at both carotid bifurcations but no stenosis. Electronically Signed   By: MNelson ChimesM.D.   On: 07/24/2021 20:11   CT HEAD WO CONTRAST  Result Date: 07/24/2021 CLINICAL DATA:  Possible stroke.  Generalized weakness EXAM: CT HEAD WITHOUT CONTRAST TECHNIQUE: Contiguous axial images were obtained from the base of the skull through the vertex without intravenous contrast. COMPARISON:  06/30/2021 FINDINGS: Brain: No evidence of acute infarction, hemorrhage, hydrocephalus, extra-axial collection or mass lesion/mass effect.Chronic small vessel ischemia in the hemispheric white matter with chronic lacunar infarcts at the left caudate head and genu of the corpus callosum. Recent left cerebral facet recent cerebral infarcts by MRI are not detected due  to small size and timing. Vascular: No hyperdense vessel or unexpected calcification. Skull: Normal. Negative for fracture or focal lesion. Sinuses/Orbits: No acute finding. IMPRESSION: 1. No acute or interval finding. 2. Chronic small vessel disease including chronic small-vessel infarcts. Electronically Signed   By: JJorje GuildM.D.   On: 07/24/2021 09:13   MR Brain Wo Contrast (neuro protocol)  Result Date: 07/24/2021 CLINICAL DATA:  Dizziness, weakness EXAM: MRI HEAD WITHOUT CONTRAST TECHNIQUE: Multiplanar, multiecho pulse sequences of the brain and surrounding structures were obtained without intravenous contrast. COMPARISON:  Same-day noncontrast CT head: Brain MRI 06/30/2021 FINDINGS: Brain: There are small foci of diffusion restriction in the left centrum semiovale/periventricular white matter and left temporal lobe with faint associated FLAIR signal abnormality. There is more faint elevated DWI signal in the right superior frontal gyrus, left paracentral lobule, and left centrum semiovale more posteriorly. There is no evidence of acute intracranial hemorrhage or extra-axial fluid collection. There is a remote hemorrhage in the right thalamus. There are remote lacunar infarcts in the bilateral basal ganglia, the largest on the left. A curvilinear focus of SWI signal dropout in the left frontal lobe likely reflects a developmental venous anomaly. An adjacent nodular focus of SWI signal  dropout may reflect a nonspecific punctate chronic microhemorrhage or small cavernoma Parenchymal volume loss and advanced chronic white matter microangiopathy are not significantly changed. There is no mass lesion. There is no midline shift. Vascular: Normal flow voids. Skull and upper cervical spine: Normal marrow signal. Sinuses/Orbits: The imaged paranasal sinuses are clear. The globes and orbits are unremarkable. Other: None. IMPRESSION: 1. Small acute to subacute infarcts in the left centrum semiovale and temporal  lobe, new since 06/30/2021. Additional more faint foci of diffusion restriction in the bilateral frontal lobes described above likely reflect more late subacute to early chronic infarcts. 2. No acute intracranial hemorrhage. 3. 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 Electronically Signed   By: Valetta Mole M.D.   On: 07/24/2021 15:43   LONG TERM MONITOR-LIVE TELEMETRY (3-14 DAYS)  Result Date: 07/23/2021 Event Monitor Patch Wear Time:  14 days and 0 hours (2022-08-24T09:32:15-0400 to 2022-09-07T09:32:15-0400) Normal sinus rhythm Patient had a min HR of 43 bpm, max HR of 141 bpm, and avg HR of 59 bpm. 1 run of Ventricular Tachycardia occurred lasting 4 beats with a max rate of 141 bpm (avg 118 bpm). Isolated SVEs were rare (<1.0%), SVE Triplets were rare (<1.0%), and no SVE Couplets were present. Isolated VEs were rare (<1.0%), and no VE Couplets or VE Triplets were present. Patient triggered event associated with normal sinus Signed, Esmond Plants, MD, Ph.D [facility]     Assessment and Plan:   Acute stroke Prior history of stroke 2014 MRI this admission withsmall acute to subacute infarct in the left temporal lobe  Neurology requesting transesophageal echo as strokes are bilateral Also need for event monitor -Appears to be making good recovery, left-sided weakness essentially resolved On aspirin with Plavix, statin (Plavix added this admission)  2 coronary artery disease with stable angina Currently with no symptoms of angina. No further workup at this time.  TEE as above Not on beta-blocker secondary to bradycardia Neurology requesting permissive blood pressure  3.  Essential hypertension As outpatient on metoprolol succinate 50, triamterene HCTZ, Lasix 20 daily Appears these have been held for now in the setting of stroke  4.  Hyperlipidemia continue Crestor 20 daily  Long discussion concerning  her stroke symptoms, prior stroke, medications, TEE, risk and benefit  Total encounter time more than 110 minutes  Greater than 50% was spent in counseling and coordination of care with the patient   For questions or updates, please contact [place] Please consult [email or web address] for contact info under    Signed, [name] Rogue, MD  07/25/2021 10:53 AM

## 2021-07-25 NOTE — Consult Note (Signed)
NEUROLOGY CONSULTATION NOTE   Date of service: July 25, 2021 Patient Name: [name] BARRACLOUGH MRN:  [id number] DOB:  [birth year] Reason for consult: embolic stroke _ _ _   _ __   _ __ _ _  __ __   _ __   __ _  History of Present Illness   65 y.o. female past medical history of hypertension, hyperlipidemia coronary artery disease, bilateral mild carotid stenosis, prior stroke including right thalamic ICH in June 2022 (likely hypertensive) w/ minimal residual left-sided weakness, hypertension, hyperlipidemia, another prior stroke in 2014 with no deficits, former smoker, history of migraines presented to ED with vertigo and was found to have multifocal acute and subacute infarcts, bilateral suggesting central embolic source.  Stroke w/u this admission:  MRI brain wo  1. Small acute to subacute infarcts in the left centrum semiovale and temporal lobe, new since 06/30/2021. Additional more faint foci of diffusion restriction in the bilateral frontal lobes described above likely reflect more late subacute to early chronic infarcts. 2. No acute intracranial hemorrhage. 3. 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  CNS imaging personally reviewed; I agree with above interpretation  CTA H&N - no hemodynamically significant stenoses  TEE showed no intracardiac clot.   She is seen this afternoon after TEE and is drowsy from sedation, otherwise feeling well, baseline mild LUE and LLE weakness and acute worsening of chronic vertigo, otherwise no new neurologic deficits.     ROS   Per HPI; all other systems reviewed and are negative  Past History   Past Medical History:  Diagnosis Date   Anxiety    Arthritis    CAD (coronary artery disease)    a. 03/2013 NSTEMI/Cath: 100 RCA, EF 45%->Med Rx;  b. 12/2013 Cath/PCI: LAD 30m 30d, D1 80, D2 60, LCX nl, RCA 50ost/p, 626m95d (2.5x33 Xience DES);  c. 05/2014  Lexi MV: EF 68%, no ischemia; d. stress echo 12/2015 no ischemia @ max exercise (did not achieve target)   Carotid disease, bilateral ([place]   a. 08/2013 Carotid U/S: 1-39% bilat ICA stenosis.   Chronic kidney disease    Chronic kidney infections. Takes daily preventative.   Complication of anesthesia    CV[place]cere[place] vascular accident) [place]   a. 08/2013.   CV[place]ce[place] vascular [place]) [place]   Decreased libido    Depression    GERD (gastroesophageal reflux disease)    History of 2019 novel coronavirus disease (COVID-19) 07/2019   History of recurrent UTIs    Hypercholesteremia    Hyperlipemia    Hypertension    Insomnia    Ischemic cardiomyopathy    a. 03/2013 Echo: EF 30-35%, mod dil LA, mod-sev MR, mod TR;  b. 08/2013 Echo EF 60-65%, mild AI.   Menopause    Migraines    Myocardial infarction (HCC)    PONV (postoperative nausea and vomiting)    If anesthsia is given slowly, pt will not throw up, if given quickly, pt will have nausea and vomiting.   Right lower quadrant pain    Syncope and collapse    Tobacco abuse    Vaginal atrophy    Past Surgical History:  Procedure Laterality Date   CARDIAC CATHETERIZATION  01/01/2014   CARDIAC CATHETERIZATION  03/16/2013   CARDIAC CATHETERIZATION  12/2013   CARDIAC CATHETERIZATION N/A 08/19/2016   Procedure: Left Heart Cath and Coronary Angiography;  Surgeon: TiMinna MerrittsMD;  [facility];  Service: Cardiovascular;  Laterality: N/A;   CESAREAN SECTION WITH BILATERAL TUBAL LIGATION     CORONARY ANGIOPLASTY WITH STENT PLACEMENT  01/01/2014   95% lesion with a drug eluting stent to the distal RCA.   ENDOMETRIAL ABLATION     KNEE ARTHROSCOPY  11/27/2006   left knee    OOPHORECTOMY     TOTAL KNEE ARTHROPLASTY Left 10/06/2016   Procedure: TOTAL KNEE ARTHROPLASTY;  Surgeon: Corky Mull, MD;  Location: [facility] ORS;  Service: Orthopedics;  Laterality: Left;   TOTAL KNEE ARTHROPLASTY Right 11/12/2020   Procedure:  TOTAL KNEE ARTHROPLASTY;  Surgeon: Corky Mull, MD;  Location: [facility] ORS;  Service: Orthopedics;  Laterality: Right;   TUBAL LIGATION     Family History  Problem Relation Age of Onset   Hypertension Mother    Stroke Mother    Hyperlipidemia Mother    Breast cancer Paternal Grandmother    Colon cancer Neg Hx    Ovarian cancer Neg Hx    Heart disease Neg Hx    Diabetes Neg Hx    Social History   Socioeconomic History   Marital status: Single    Spouse name: Not on file   Number of children: 1   Years of education: Not on file   Highest education level: Not on file  Occupational History   Occupation: Firefighter: [employer]  Tobacco Use   Smoking status: Every Day    Packs/day: 1.50    Years: 20.00    Pack years: 30.00    Types: Cigarettes    Last attempt to quit: 04/07/2013    Years since quitting: 8.3   Smokeless tobacco: Never  Vaping Use   Vaping Use: Never used  Substance and Sexual Activity   Alcohol use: Yes    Comment: 1 glass of wine a month   Drug use: No   Sexual activity: Not Currently    Birth control/protection: None  Other Topics Concern   Not on file  Social History Narrative   Lives alone, not compliant w/ med regimen   Social Determinants of Health   Financial Resource Strain: Not on file  Food Insecurity: Not on file  Transportation Needs: Not on file  Physical Activity: Not on file  Stress: Not on file  Social Connections: Not on file   Allergies  Allergen Reactions   Codeine Sulfate Other (See Comments)    sick   Penicillins Nausea And Vomiting    Has patient had a PCN reaction causing immediate rash, facial/tongue/throat swelling, SOB or lightheadedness with hypotension: No Has patient had a PCN reaction causing severe rash involving mucus membranes or skin necrosis: No Has patient had a PCN reaction that required hospitalization No Has patient had a PCN reaction occurring within the last 10 years: No If all  of the above answers are "NO", then may proceed with Cephalosporin use.     Medications   Medications Prior to Admission  Medication Sig Dispense Refill Last Dose   aspirin EC 81 MG EC tablet Take 1 tablet (81 mg total) by mouth daily. Swallow whole. 30 tablet 0 07/23/2021 at 0800   furosemide (LASIX) 20 MG tablet Take 20 mg by mouth daily.   07/23/2021 at 0800   metoprolol succinate (TOPROL-XL) 50 MG 24 hr tablet Take 50 mg by mouth daily. Take with or immediately following a meal.   07/23/2021 at 0800   rosuvastatin (CRESTOR) 20 MG tablet Take  20 mg by mouth daily.   07/23/2021 at 2000   acetaminophen (TYLENOL) 500 MG tablet Take 500 mg by mouth every 6 (six) hours as needed for moderate pain or mild pain.   unknown at prn   amLODipine (NORVASC) 10 MG tablet Take 1 tablet (10 mg total) by mouth daily. (Patient not taking: No sig reported)   Not Taking   FLUoxetine (PROZAC) 40 MG capsule Take 40 mg by mouth daily. (Patient not taking: Reported on 07/24/2021)   Not Taking   nitroGLYCERIN (NITROSTAT) 0.4 MG SL tablet Place 0.4 mg under the tongue every 5 (five) minutes as needed for chest pain.   unknown at prn   pantoprazole (PROTONIX) 40 MG tablet Take 1 tablet (40 mg total) by mouth at bedtime. (Patient not taking: Reported on 07/24/2021)   Not Taking   triamterene-hydrochlorothiazide (DYAZIDE) 37.5-25 MG capsule Take 1 capsule by mouth daily. (Patient not taking: Reported on 07/24/2021)   Not Taking     Vitals   Vitals:   07/25/21 1245 07/25/21 1309 07/25/21 1553 07/25/21 2005  BP: 111/75 (!) 108/53 127/70 (!) 145/69  Pulse: (!) 56 (!) 55 (!) 51 (!) 59  Resp: '12 16 18 16  '$ Temp:   97.6 F (36.4 C) 98.2 F (36.8 C)  TempSrc:      SpO2: 93% 98% 100% 97%  Weight:      Height:         Body mass index is 24.14 kg/m.  Physical Exam   Physical Exam Gen: A&O x4, NAD HEENT: Atraumatic, normocephalic;mucous membranes moist; oropharynx clear, tongue without atrophy or  fasciculations. Neck: Supple, trachea midline. Resp: CTAB, no w/r/r CV: RRR, no m/g/r; nml S1 and S2. 2+ symmetric peripheral pulses. Abd: soft/NT/ND; nabs x 4 quad Extrem: Nml bulk; no cyanosis, clubbing, or edema.  Neuro: *MS: A&O x4. Follows multi-step commands.  *Speech: fluid, nondysarthric, able to name and repeat *CN:    I: Deferred   II,III: PERRLA, VFF by confrontation, optic discs sharp   III,IV,VI: EOMI w/o nystagmus, no ptosis   V: Sensation intact from V1 to V3 to LT   VII: Eyelid closure was full.  Smile symmetric.   VIII: Hearing intact to voice   IX,X: Voice normal, palate elevates symmetrically    XI: SCM/trap 5/5 bilat   XII: Tongue protrudes midline, no atrophy or fasciculations  *Motor:   Normal bulk.  No tremor, rigidity or bradykinesia. Drift in LUE and LLE but not to bed. LUE exam also limited 2/2 pain from prior fracture. Full strength RUE and RLE throughout. *Sensory: Intact to light touch, pinprick, temperature vibration throughout. Symmetric. Propioception intact bilat.  No double-simultaneous extinction.  *Coordination:  Finger-to-nose, heel-to-shin, rapid alternating motions were intact. *Reflexes:  2+ more brisk on L side throughout without clonus; toes down-going bilat *Gait: deferred  NIHSS = 2 (LUE and LLE drift)   Premorbid mRS = 1   Labs   CBC:  Recent Labs  Lab 07/24/21 0846 07/25/21 0624  WBC 8.1 8.6  NEUTROABS 4.9  --   HGB 11.7* 11.4*  HCT 32.0* 32.6*  MCV 93.3 95.3  PLT 276 [id number]    Basic Metabolic Panel:  Lab Results  Component Value Date   NA 141 07/25/2021   K 3.3 (L) 07/25/2021   CO2 26 07/25/2021   GLUCOSE 90 07/25/2021   BUN 19 07/25/2021   CREATININE 0.75 07/25/2021   CALCIUM 8.9 07/25/2021   GFRNONAA >60 07/25/2021   GFRAA >60 12/27/2017  Lipid Panel:  Lab Results  Component Value Date   LDLCALC 74 07/01/2021   HgbA1c:  Lab Results  Component Value Date   HGBA1C 6.2 (H) 07/01/2021   Urine Drug Screen:      Component Value Date/Time   LABOPIA NONE DETECTED 02/18/2015 0410   COCAINSCRNUR NONE DETECTED 02/18/2015 0410   LABBENZ NONE DETECTED 02/18/2015 0410   AMPHETMU NONE DETECTED 02/18/2015 0410   THCU NONE DETECTED 02/18/2015 0410   LABBARB NONE DETECTED 02/18/2015 0410    Alcohol Level     Component Value Date/Time   ETH <11 08/10/2013 1020     Impression   65 y.o. female past medical history of hypertension, hyperlipidemia coronary artery disease, bilateral mild carotid stenosis, prior stroke including right thalamic ICH in June 2022 (likely hypertensive) w/ minimal residual left-sided weakness, hypertension, hyperlipidemia, another prior stroke in 2014 with no deficits, former smoker, history of migraines presented to ED with vertigo and was found to have multifocal acute and subacute infarcts, bilateral suggesting central embolic source. No intracardiac clot by TEE.  Recommendations   - ASA '81mg'$  daily + plavix '75mg'$  daily - Will need 14 day amb cardiac monitor after discharge potentially followed by ILR implantation if no e/o a fib on initial recording. Cardiology consulted.  - Permissive HTN x48 hrs from sx onset goal BP <220/110. PRN labetalol or hydralazine if BP above these parameters. Avoid oral antihypertensives. After 48 hrs goal is normotension, avoid hypotension. - Continue rosuvastatin '20mg'$  daily - Tele - PT/OT/SLP - Stroke education  No further inpatient neurologic workup indicated. Neurology to sign off. Patient may f/u with established outpatient neurologist as well as cardiology per above. ______________________________________________________________________   Thank you for the opportunity to take part in the care of this patient. If you have any further questions, please contact the neurology consultation attending.  Signed,  Su Monks, MD [facility] [phone]  If 7pm- 7am, please page neurology on call as listed in [place].

## 2021-07-25 NOTE — Progress Notes (Signed)
*  PRELIMINARY RESULTS* Echocardiogram 2D Echocardiogram has been performed.  [name] 07/25/2021, 12:32 PM

## 2021-07-25 NOTE — Evaluation (Signed)
Speech Language Pathology Evaluation Patient Details Name: [name] MRN: [id number] DOB: 07/17/1956 Today's Date: 07/25/2021 Time: [id number] SLP Time Calculation (min) (ACUTE ONLY): 40 min  Problem List:  Patient Active Problem List   Diagnosis Date Noted   CVA (cerebral vascular accident) ([place]) 06/30/2021   Thalamic hemorrhage ([place]) 04/10/2021   ICH (intracerebral hemorrhage) ([place]) 04/10/2021   Pelvic pain-right lower quadrant 07/14/2018   Right lower quadrant abdominal pain 06/30/2018   Bilateral carotid artery stenosis [id number]   Chronic systolic CHF (congestive heart failure) ([place]) 03/12/2018   Status post total knee replacement using cement, left 10/06/2016   History of coronary artery stent placement    Pure hypercholesterolemia    Recurrent UTI 02/11/2016   Menopause 02/11/2016   Angina pectoris ([place]) 01/07/2016   Syncope 07/11/2015   Orthostatic hypotension 02/19/2015   Dehydration 02/19/2015   Hypokalemia 02/19/2015   Stroke ([place]) 02/18/2015   Tobacco abuse 02/18/2015   History of stroke 07/18/2014   Vertigo 07/18/2014   Bilateral wrist pain 07/18/2014   Atypical chest pain 06/05/2014   Coronary artery disease of native artery of native heart with stable angina pectoris ([place]) 01/19/2014   Carotid arterial disease ([place]) 01/19/2014   Depression 01/16/2014   Memory loss 01/16/2014   Right sided weakness 08/10/2013   Essential hypertension 08/10/2013   Mixed hyperlipidemia 08/10/2013   History of MI (myocardial infarction) 08/10/2013   Stroke, acute, embolic ([place]) [id number]   Past Medical History:  Past Medical History:  Diagnosis Date   Anxiety    Arthritis    CAD (coronary artery disease)    a. 03/2013 NSTEMI/Cath: 100 RCA, EF 45%->Med Rx;  b. 12/2013 Cath/PCI: LAD 21m 30d, D1 80, D2 60, LCX nl, RCA 50ost/p, 673m95d (2.5x33 Xience DES);  c. 05/2014 Lexi MV: EF 68%, no ischemia; d. stress echo 12/2015 no ischemia @ max exercise (did not achieve target)    Carotid disease, bilateral ([facility]   a. 08/2013 Carotid U/S: 1-39% bilat ICA stenosis.   Chronic kidney disease    Chronic kidney infections. Takes daily preventative.   Complication of anesthesia    [place]) ([facility]   a. 08/2013.   [facility] [place]) [place] [place]   Decreased libido    Depression    GERD (gastroesophageal reflux disease)    History of 2019 novel coronavirus disease (COVID-19) 07/2019   History of recurrent UTIs    Hypercholesteremia    Hyperlipemia    Hypertension    Insomnia    Ischemic cardiomyopathy    a. 03/2013 Echo: EF 30-35%, mod dil LA, mod-sev MR, mod TR;  b. 08/2013 Echo EF 60-65%, mild AI.   Menopause    Migraines    Myocardial infarction (HCC)    PONV (postoperative nausea and vomiting)    If anesthsia is given slowly, pt will not throw up, if given quickly, pt will have nausea and vomiting.   Right lower quadrant pain    Syncope and collapse    Tobacco abuse    Vaginal atrophy    Past Surgical History:  Past Surgical History:  Procedure Laterality Date   CARDIAC CATHETERIZATION  01/01/2014   CARDIAC CATHETERIZATION  03/16/2013   CARDIAC CATHETERIZATION  12/2013   CARDIAC CATHETERIZATION N/A 08/19/2016   Procedure: Left Heart Cath and Coronary Angiography;  Surgeon: TiMinna MerrittsMD;  [facility];  Service: Cardiovascular;  Laterality: N/A;   CESAREAN SECTION WITH BILATERAL TUBAL LIGATION  CORONARY ANGIOPLASTY WITH STENT PLACEMENT  01/01/2014   95% lesion with a drug eluting stent to the distal RCA.   ENDOMETRIAL ABLATION     KNEE ARTHROSCOPY  11/27/2006   left knee    OOPHORECTOMY     TOTAL KNEE ARTHROPLASTY Left 10/06/2016   Procedure: TOTAL KNEE ARTHROPLASTY;  Surgeon: Corky Mull, MD;  Location: [facility];  Service: Orthopedics;  Laterality: Left;   TOTAL KNEE ARTHROPLASTY Right 11/12/2020   Procedure: TOTAL KNEE ARTHROPLASTY;  Surgeon: Corky Mull, MD;  Location: [facility];  Service:  Orthopedics;  Laterality: Right;   TUBAL LIGATION     HPI:  Pt is a Illness 65 years old female with PMH significant for known CVAs, acute right thalamic ICH, coronary artery disease s/p PCI with stent, bilateral carotid disease, GERD, hyperlipidemia, hypertension, ischemic cardiomyopathy, tobacco use disorder presented in the ED after waking up in the morning with severe vertigo associated with nausea and inability to ambulate.  Patient was last known well was last night.  Patient was brought in the ED initial CT head shows no acute findings,  MRI brain revealed small acute to subacute infarct in the left temporal lobe new since 8/22.  Cardiology following for TEE today.   Assessment / Plan / Recommendation Clinical Impression  Patient seen today for infomral beside assessment of cognitive-linguistic skills and abilities. Much of her performance was assessed during her engagement and performace during her OT evaluation: following instructions for tasks, verbal communication/accuracy in answering questions. Pt's MD also arrived to discuss her TEE procedure to which pt asked appropriate questions and gave appropriate answers to MD.      Few, informal aspects of the Bedside Western Aphasia Battery (B-WAB). The following scores: Content (9/10), Fluency 10/10), Auditory Verbal Comprehension (9/10), Repetition (10/10), Naming (10/10) revealing adequate expressive/receptive language skills. Pt did not appear to demonstrate any difficulty with Verbal Apraxia or Motor Speech skills. Patient's speech was clear. Cognitive status appeared grossly [facility] for general orientation and situational tasks. Awareness and insight seemed appropriate w/ these; MD present agreed. However, pt's safety awareness and insight appears min reduced as she does not consistently use her walker when moving about as recommended. Pt stated she has some difficulty in attending to, and answering, multiple questions being asked at one time of  her; "too many questions too quickly are hard for me to answer". Suspect min Memory/Retrieval deficit. Pt stated this is Baseline at home even w/ her Friend who comes by to help her. Suspect this could be impact from her baseline CVAs. Noted OT's assessment including  "patient currently functioning at supervision - min guard for functional mobility and self care; decreased I in self care, balance, functional mobility/transfers, endurance, and safety awareness.".     SLP provided education re: importance of reducing distractions during verbal communications w/ others and asking for people to slow down in their communications w/ her -- 1 question at a time; turning off TV. Encouraged to ask for Repetition of information when needed; written information if needed. If pt desires, further skilled ST services can f/u post Discharge to address more formally any expressive-receptive language needs and/or strategies to increase overall communication and communication effectiveness in ADLs. Pt agreed to f/u w/ her PCP if needed. Agree w/ OT's recommendations re: safety awareness and Supervision at discharge. Updated MD and NSG/CM.    SLP Assessment  SLP Recommendation/Assessment: All further Speech Lanaguage Pathology  needs can be addressed in the next venue of care SLP  Visit Diagnosis: Cognitive communication deficit (R41.841)    Recommendations for follow up therapy are one component of a multi-disciplinary discharge planning process, led by the attending physician.  Recommendations may be updated based on patient status, additional functional criteria and insurance authorization.    Follow Up Recommendations  None (at this time)    Frequency and Duration  (n/a)   (n/a)      SLP Evaluation Cognition  Overall Cognitive Status: Within Functional Limits for tasks assessed Arousal/Alertness: Awake/alert Orientation Level: Oriented X4;Oriented to person;Oriented to place;Oriented to time;Oriented to  situation Year: 2022 Month: September Attention: Focused;Sustained Focused Attention: Appears intact Sustained Attention: Appears intact Memory: Impaired Memory Impairment: Decreased recall of new information Immediate Memory Recall: Sock (missed) Awareness: Appears intact Problem Solving: Appears intact Behaviors:  (none) Safety/Judgment: Impaired Comments: does not always use her walker when walking around home as recommended -- she has had recent Falls(multiple)       Comprehension  Auditory Comprehension Overall Auditory Comprehension: Appears within functional limits for tasks assessed Yes/No Questions: Within Functional Limits Commands: Within Functional Limits Conversation: Simple (general) Visual Recognition/Discrimination Discrimination: Not tested    Expression Expression Primary Mode of Expression: Verbal Verbal Expression Overall Verbal Expression: Appears within functional limits for tasks assessed Initiation: No impairment Automatic Speech: Name;Social Response;Day of week Level of Generative/Spontaneous Verbalization: Conversation Repetition: No impairment Naming: No impairment Pragmatics: No impairment Non-Verbal Means of Communication: Not applicable Other Verbal Expression Comments: appeared functional in general conversation and engagement -- even during conversation w/ MD re: her TEE, and during OT session Written Expression Dominant Hand: Right Written Expression: Not tested   Oral / Motor  Oral Motor/Sensory Function Overall Oral Motor/Sensory Function: Within functional limits Motor Speech Overall Motor Speech: Appears within functional limits for tasks assessed Respiration: Within functional limits Phonation: Normal Resonance: Within functional limits Articulation: Within functional limitis Intelligibility: Intelligible Motor Planning: Witnin functional limits Motor Speech Errors: Not applicable   GO                      Orinda Kenner, MS, CCC-SLP Speech Language Pathologist Rehab Services [phone] [facility] 07/25/2021, 2:03 PM

## 2021-07-25 NOTE — Evaluation (Signed)
Occupational Therapy Evaluation Patient Details Name: [name] MRN: [id number] DOB: [birth year] Today's Date: 07/25/2021   History of Present Illness 65 years old female with PMH significant for recent CVA, acute right thalamic ICH, coronary artery disease s/p PCI with stent, bilateral carotid disease, GERD, hyperlipidemia, hypertension, ischemic cardiomyopathy, tobacco use disorder presented in the ED after waking up in the morning with severe vertigo associated with nausea and inability to ambulate.  Patient was last known well was last night.  Patient was brought in the ED initial CT head shows no acute findings,  MRI brain revealed small acute to subacute infarct in the left temporal lobe new since 8/22.   Clinical Impression   Patient presenting with decreased I in self care, balance, functional mobility/transfers, endurance, and safety awareness. Patient reports living at home alone with use of RW for functional mobility PTA. She reports also furniture surfing and having 10+ falls in the last 6 month. Pt reports a friend is able to take her to appointments and assist with IADLS as needed. Pt given BIMS with 3/3 immediate recall and 2/3 recall after ~ 5 minutes. She verbalizes performing her own medication management at home. Would like to attempt pill box test next session to look at safety of this. Patient currently functioning at supervision - min guard for functional mobility and self care. Patient will benefit from acute OT to increase overall independence in the areas of ADLs, functional mobility, and safety awareness in order to safely discharge to next venue of care.      Recommendations for follow up therapy are one component of a multi-disciplinary discharge planning process, led by the attending physician.  Recommendations may be updated based on patient status, additional functional criteria and insurance authorization.   Follow Up Recommendations  Supervision - Intermittent;Home  health OT    Equipment Recommendations  None recommended by OT       Precautions / Restrictions Precautions Precautions: Fall      Mobility Bed Mobility Overal bed mobility: Modified Independent Bed Mobility: Sidelying to Sit;Supine to Sit   Sidelying to sit: Modified independent (Device/Increase time) Supine to sit: Modified independent (Device/Increase time)     General bed mobility comments: no physical assistance needed    Transfers Overall transfer level: Needs assistance Equipment used: 1 person hand held assist Transfers: Sit to/from [employer] Transfers Sit to Stand: Supervision Stand pivot transfers: Supervision;Min guard            Balance Overall balance assessment: Needs assistance Sitting-balance support: Feet supported Sitting balance-Leahy Scale: Good     Standing balance support: During functional activity Standing balance-Leahy Scale: Fair                             ADL either performed or assessed with clinical judgement   ADL Overall ADL's : Needs assistance/impaired     Grooming: Oral care;Sitting;Supervision/safety               Lower Body Dressing: Supervision/safety;Sitting/lateral leans               Functional mobility during ADLs: Supervision/safety       Vision Patient Visual Report: No change from baseline              Pertinent Vitals/Pain Pain Assessment: No/denies pain     Hand Dominance Right   Extremity/Trunk Assessment Upper Extremity Assessment Upper Extremity Assessment: Generalized weakness;LUE deficits/detail LUE Deficits / Details: decreased  speed, dexterity, and fine motor coordination but functional to perform all tasks.           Communication Communication Communication: No difficulties   Cognition Arousal/Alertness: Awake/alert Behavior During Therapy: WFL for tasks assessed/performed Overall Cognitive Status: Within Functional Limits for tasks assessed                                                 Home Living Family/patient expects to be discharged to:: Private residence Living Arrangements: Alone Available Help at Discharge: Available PRN/intermittently;Friend(s);Neighbor Type of Home: House Home Access: Level entry     Home Layout: One level     Bathroom Shower/Tub: Teacher, early years/pre: Standard     Home Equipment: Environmental consultant - 2 wheels;Bedside commode;Shower seat;Grab bars - tub/shower;Grab bars - toilet;Cane - single point          Prior Functioning/Environment Level of Independence: Needs assistance  Gait / Transfers Assistance Needed: Pt reports use of RW at home but also endorses furniture walking. Pt reports 10+ falls at home in the last 6 months. ADL's / Homemaking Assistance Needed: Pt reports being Ind in all aspects of self care and if she needs assist with IADL taks such as cooking and cleaning she her friend assists her.            OT Problem List: Decreased strength;Decreased coordination;Decreased activity tolerance;Decreased safety awareness;Impaired balance (sitting and/or standing);Decreased knowledge of use of DME or AE;Decreased knowledge of precautions      OT Treatment/Interventions: Self-care/ADL training;Therapeutic exercise;Therapeutic activities;Neuromuscular education;Energy conservation;Cognitive remediation/compensation;Patient/family education;DME and/or AE instruction;Balance training    OT Goals(Current goals can be found in the care plan section) Acute Rehab OT Goals Patient Stated Goal: to go home OT Goal Formulation: With patient Time For Goal Achievement: 08/08/21 Potential to Achieve Goals: Good ADL Goals Pt Will Perform Grooming: with modified independence Pt Will Perform Lower Body Dressing: with modified independence;sit to/from stand Pt Will Transfer to Toilet: with modified independence;ambulating Pt Will Perform Toileting - Clothing Manipulation and  hygiene: with modified independence;sit to/from stand  OT Frequency: Min 2X/week   Barriers to D/C:    none known at this time          AM-PAC OT "6 Clicks" Daily Activity     Outcome Measure Help from another person eating meals?: None Help from another person taking care of personal grooming?: None Help from another person toileting, which includes using toliet, bedpan, or urinal?: A Little Help from another person bathing (including washing, rinsing, drying)?: A Little Help from another person to put on and taking off regular upper body clothing?: None Help from another person to put on and taking off regular lower body clothing?: A Little 6 Click Score: 21   End of Session Nurse Communication: Mobility status  Activity Tolerance: Patient tolerated treatment well Patient left: in bed;with call bell/phone within reach;with bed alarm set  OT Visit Diagnosis: Unsteadiness on feet (R26.81);Muscle weakness (generalized) (M62.81);History of falling (Z91.81);Repeated falls (R29.6);Other (comment) (coordination deficits)                Time: [id number] OT Time Calculation (min): 24 min Charges:  OT General Charges $OT Visit: 1 Visit OT Evaluation $OT Eval Low Complexity: 1 Low OT Treatments $Self Care/Home Management : 8-22 mins  Darleen Crocker, MS, OTR/L , CBIS ascom [phone]  07/25/21,  1:22 PM

## 2021-07-25 NOTE — Progress Notes (Signed)
Transesophageal Echocardiogram :  Indication: Acute stroke Requesting/ordering  physician:   Procedure: Benzocaine spray x2 and 2 mls x 2 of viscous lidocaine were given orally to provide local anesthesia to the oropharynx. The patient was positioned supine on the left side, bite block provided. The patient was moderately sedated with the doses of versed and fentanyl as detailed below.  Using digital technique an omniplane probe was advanced into the distal esophagus without incident.   Moderate sedation: 1. Sedation used:  Versed: 4 mg Fentanyl: 100 mcg 2. Time administered:   11:30 AM time when patient started recovery: 12:15 PM Total sedation time 45 minute60 3. I was face to face during this time  See report in EPIC  for complete details: In brief, transgastric imaging revealed normal LV function with no RWMAs and no mural apical thrombus.  .  Estimated ejection fraction was 60%.  Right sided cardiac chambers were normal with no evidence of pulmonary hypertension.  Imaging of the septum showed no ASD or VSD Bubble study was negative for shunt 2D and color flow confirmed no PFO  The LA was well visualized in orthogonal views.  There was no spontaneous contrast and no thrombus in the LA and LA appendage   The descending thoracic aorta had mild  mural aortic debris with no evidence of aneurysmal dilation or disection   Ida Rogue 07/25/2021 12:29 PM

## 2021-07-25 NOTE — Evaluation (Addendum)
Physical Therapy Evaluation Patient Details Name: [name] MRN: [id number] DOB: [birth year] Today's Date: 07/25/2021  History of Present Illness  65 years old female with PMH significant for recent CVA, acute right thalamic ICH, coronary artery disease s/p PCI with stent, bilateral carotid disease, GERD, hyperlipidemia, hypertension, ischemic cardiomyopathy, tobacco use disorder presented in the ED after waking up in the morning with severe vertigo associated with nausea and inability to ambulate.  Patient was last known well was last night.  Patient was brought in the ED initial CT head shows no acute findings,  MRI brain revealed small acute to subacute infarct in the left temporal lobe new since 8/22.  Clinical Impression  Pt received mid-transfer to [facility] with RN. Agreeable to therapy. She lives alone in a single story home with a level entrance. She uses a RW at baseline and has endured 10+ falls since June. Pt unsure what is causing falls, PT unable to piece together a common denominator after asking questions. Pt demo difficulty attaining eye contact throughout session.   She performed bed mobility Mod I, all transfers and ambulation SUP using RW. She has been reporting constant dizziness over the past 2 days. It does become more severe with bed mobility however it never fully goes away. Pt was educated on possible vestibular malfunction - central in origin s/p multiple infarcts. She was also educated on safety with transitions and transfers, to move slowly when sitting up out of bed and to ensure dizziness has diminished prior to standing. She does demo L-sided UE and LE deficits including mild weakness and decreased coordination. Pt would benefit from further evaluation of dizziness/vestibular function and treatment/possible habituation exercises. Would benefit from skilled PT to address above deficits and promote optimal return to PLOF.    Recommendations for follow up therapy are one  component of a multi-disciplinary discharge planning process, led by the attending physician.  Recommendations may be updated based on patient status, additional functional criteria and insurance authorization.  Follow Up Recommendations Outpatient PT (for possible vestibular therapy - central in origin? Pt would also like to continue therapy for L shoulder.)    Equipment Recommendations  None recommended by PT    Recommendations for Other Services       Precautions / Restrictions Precautions Precautions: Fall Restrictions Weight Bearing Restrictions: No      Mobility  Bed Mobility Overal bed mobility: Modified Independent Bed Mobility: Sidelying to Sit;Supine to Sit   Sidelying to sit: Modified independent (Device/Increase time) Supine to sit: Modified independent (Device/Increase time)     General bed mobility comments: no physical assistance needed    Transfers Overall transfer level: Needs assistance Equipment used: Rolling walker (2 wheeled) Transfers: Sit to/from Stand Sit to Stand: Supervision Stand pivot transfers: Supervision       General transfer comment: SUP for safety  Ambulation/Gait Ambulation/Gait assistance: Supervision Gait Distance (Feet): 200 Feet Assistive device: Rolling walker (2 wheeled) Gait Pattern/deviations: Step-through pattern;Trunk flexed;Decreased stride length Gait velocity: decreased   General Gait Details: SUP for safety, pt remained steady using RW. She reported decrease in dizziness during ambulation however mild dizziness did remain.  Stairs            Wheelchair Mobility    Modified Rankin (Stroke Patients Only)       Balance Overall balance assessment: Needs assistance Sitting-balance support: Feet supported Sitting balance-Leahy Scale: Good     Standing balance support: During functional activity Standing balance-Leahy Scale: Fair Standing balance comment: was able to  remove hands from RW to don mask                              Pertinent Vitals/Pain Pain Assessment: No/denies pain    Home Living Family/patient expects to be discharged to:: Private residence Living Arrangements: Alone Available Help at Discharge: Available PRN/intermittently;Friend(s);Neighbor Type of Home: House Home Access: Level entry     Home Layout: One [place]: [place] - 2 wheels;Bedside commode;Shower seat;Grab bars - tub/shower;Grab bars - toilet;Cane - single point      Prior Function Level of Independence: Needs assistance   Gait / Transfers Assistance Needed: Pt reports use of RW at home but also endorses furniture walking. Pt reports 10+ falls at home in the last 4 months, unsure of cause of falls.  ADL's / Homemaking Assistance Needed: Pt reports being Ind in all aspects of self care and if she needs assist with IADL taks such as cooking and cleaning she her friend assists her.        Hand Dominance   Dominant Hand: Right    Extremity/Trunk Assessment   Upper Extremity Assessment Upper Extremity Assessment: Generalized weakness;LUE deficits/detail LUE Deficits / Details: decreased speed, dexterity, and fine motor coordination but functional to perform all tasks.    Lower Extremity Assessment Lower Extremity Assessment: LLE deficits/detail;Overall WFL for tasks assessed LLE Deficits / Details: decreased speed and coordination but functioanl to perform all tasks       Communication   Communication: No difficulties  Cognition Arousal/Alertness: Awake/alert Behavior During Therapy: WFL for tasks assessed/performed Overall Cognitive Status: Within Functional Limits for tasks assessed                                 General Comments: difficulty maintaining eye contact during conversation      General Comments      Exercises     Assessment/Plan    PT Assessment Patient needs continued PT services  PT Problem List Decreased strength;Decreased  activity tolerance;Decreased safety awareness;Decreased balance;Decreased mobility       PT Treatment Interventions DME instruction;Balance training;Gait training;Neuromuscular re-education;Functional mobility training;Therapeutic activities;Therapeutic exercise;Patient/family education    PT Goals (Current goals can be found in the Care Plan section)  Acute Rehab PT Goals Patient Stated Goal: to go home PT Goal Formulation: With patient Time For Goal Achievement: 08/08/21 Potential to Achieve Goals: Good    Frequency Min 2X/week   Barriers to discharge        Co-evaluation               AM-PAC PT "6 Clicks" Mobility  Outcome Measure Help needed turning from your back to your side while in a flat bed without using bedrails?: None Help needed moving from lying on your back to sitting on the side of a flat bed without using bedrails?: None Help needed moving to and from a bed to a chair (including a wheelchair)?: A Little Help needed standing up from a chair using your arms (e.g., wheelchair or bedside chair)?: A Little Help needed to walk in hospital room?: A Little Help needed climbing 3-5 steps with a railing? : A Little 6 Click Score: 20    End of Session Equipment Utilized During Treatment: Gait belt Activity Tolerance: Patient tolerated treatment well Patient left: in bed;with call bell/phone within reach;with bed alarm set;with SCD's reapplied (OT entering room) Nurse Communication:  Mobility status PT Visit Diagnosis: Unsteadiness on feet (R26.81);Repeated falls (R29.6);History of falling (Z91.81);Muscle weakness (generalized) (M62.81);Other abnormalities of gait and mobility (R26.89);Dizziness and giddiness (R42);Hemiplegia and hemiparesis Hemiplegia - Right/Left: Left    Time: [id number] PT Time Calculation (min) (ACUTE ONLY): 25 min   Charges:   PT Evaluation $PT Eval Moderate Complexity: 1 Mod PT Treatments $Therapeutic Activity: 8-22 mins         Patrina Levering PT, DPT 07/25/21 2:22 PM [id number]   Ramonita Lab 07/25/2021, 2:22 PM

## 2021-07-26 ENCOUNTER — Encounter: Payer: Self-pay | Admitting: Cardiovascular Disease

## 2021-07-26 DIAGNOSIS — I6319 Cerebral infarction due to embolism of other precerebral artery: Secondary | ICD-10-CM | POA: Diagnosis not present

## 2021-07-26 LAB — BASIC METABOLIC PANEL
Anion gap: 7 (ref 5–15)
BUN: 18 mg/dL (ref 8–23)
CO2: 24 mmol/L (ref 22–32)
Calcium: 8.8 mg/dL — ABNORMAL LOW (ref 8.9–10.3)
Chloride: 110 mmol/L (ref 98–111)
Creatinine, Ser: 0.85 mg/dL (ref 0.44–1.00)
GFR, Estimated: >60 mL/min (ref >60)
Glucose, Bld: 84 mg/dL (ref 70–99)
Potassium: 3.6 mmol/L (ref 3.5–5.1)
Sodium: 141 mmol/L (ref 135–145)

## 2021-07-26 LAB — TSH: TSH: 1.618 u[IU]/mL (ref 0.350–4.500)

## 2021-07-26 NOTE — Progress Notes (Signed)
PROGRESS NOTE    [name]  [id number] DOB: [birth year] DOA: 07/24/2021 PCP: Baxter Hire, MD   Brief Narrative: This 65 years old female with PMH significant for recent CVA, acute right thalamic ICH, coronary artery disease s/p PCI with stent, bilateral carotid disease, GERD, hyperlipidemia, hypertension, ischemic cardiomyopathy, tobacco use disorder presented in the ED after waking up in the morning with severe vertigo associated with nausea and inability to ambulate.  Patient was last known well was last night.  Patient was brought in the ED initial CT head shows no acute findings,  MRI brain revealed small acute to subacute infarct in the left temporal lobe new since 8/22.  No acute intracranial hemorrhage.  Patient was deemed out of tPA window.  Patient is admitted for stroke work-up.  Assessment & Plan:   Active Problems:   CVA (cerebral vascular accident) ([place])  Acute CVA, recurrent : Patient presented with vertigo, nausea, inability to ambulate after she wakes up.  Last known well was last night ,  out of tPA window. Patient seen by neurology,  recommended stroke work-up. CT head negative, MRI consistent with new stroke. Frequent neurochecks, permissive hypertension for 48 hours. Continue aspirin and Plavix daily, statins. Hemoglobin A1c 6.2, LDL 177, TSH pending Continue fall precautions.  PT and OT recommended home with home PT. Neurology recommended cardio evaluation for possible TEE, event monitor. Patient underwent TEE which was unremarkable,  negative for intracardiac shunt. Neurology recommended dual antiplatelet therapy.  Patient continued to remain dizzy and nauseous.  Essential hypertension: Allow permissive hypertension, gradual normalization of BP. Blood pressure is slowly coming down.  Hyperlipidemia:  LDL 177 in 8/22 Resume home statin.  Hypokalemia: Replaced, continue to monitor  Chronic diastolic CHF: Last 2D echo 07/01/2021: LVEF 60 to [id number]  grade 1 diastolic dysfunction. Closely monitor volume status since being on gentle IV hydration.  Ambulatory dysfunction in the setting of acute CVA Continue fall precautions. PT and OT recommended home with home PT. Patient continues to remain dizzy and nauseous.  DVT prophylaxis: SCDs Code Status: Full code. Family Communication:  None at bed side. Disposition Plan:  Status is: Inpatient  Remains inpatient appropriate because:Inpatient level of care appropriate due to severity of illness  Dispo:  Patient From: Home  Planned Disposition: Home with Health Care Svc 9/18.  Medically stable for discharge: No     Consultants:  Neurology  Procedures: CT head, MRI, echo. Antimicrobials:   Anti-infectives (From admission, onward)    None        Subjective: Patient was seen and examined at bedside.  Overnight events noted.   Patient reports feeling improved. She is still reports feeling dizzy and nauseous but denies any headache.  She states she does not feel ready to be discharged today. Objective: Vitals:   07/25/21 2005 07/26/21 0002 07/26/21 0558 07/26/21 0748  BP: (!) 145/69 (!) 150/77 (!) 141/74 (!) 145/68  Pulse: (!) 59 64 68 63  Resp: '16 18 18 18  '$ Temp: 98.2 F (36.8 C) 98.5 F (36.9 C) 98.4 F (36.9 C) 98.2 F (36.8 C)  TempSrc:      SpO2: 97% 95% 96% 96%  Weight:      Height:        Intake/Output Summary (Last 24 hours) at 07/26/2021 1019 Last data filed at 07/25/2021 1859 Gross per 24 hour  Intake 740.26 ml  Output --  Net 740.26 ml   Filed Weights   07/24/21 0848 07/25/21 1107  Weight: 63 kg  59.9 kg    Examination:  General exam: Appears comfortable, not in any acute distress. Respiratory system: Clear to auscultation bilaterally, no wheezing, no crackles.   Cardiovascular system: S1-S2 heard, regular rate and rhythm, no murmur.   Gastrointestinal system: Abdomen is soft, nontender, nondistended, BS +. Central nervous system: Alert and  oriented x 3 . No focal neurological deficits. Extremities: No edema, no cyanosis, no clubbing. Skin: No rashes, lesions or ulcers Psychiatry: Judgement and insight appear normal. Mood & affect appropriate.     Data Reviewed: I have personally reviewed following labs and imaging studies  CBC: Recent Labs  Lab 07/24/21 0846 07/25/21 0624  WBC 8.1 8.6  NEUTROABS 4.9  --   HGB 11.7* 11.4*  HCT 32.0* 32.6*  MCV 93.3 95.3  PLT 276 [id number]   Basic Metabolic Panel: Recent Labs  Lab 07/24/21 0846 07/25/21 0624 07/26/21 0412  NA 140 141 141  K 2.9* 3.3* 3.6  CL 104 109 110  CO2 '26 26 24  '$ GLUCOSE 99 90 84  BUN '14 19 18  '$ CREATININE 0.84 0.75 0.85  CALCIUM 9.0 8.9 8.8*  MG  --  2.1  --   PHOS  --  4.2  --    GFR: Estimated Creatinine Clearance: 52.9 mL/min (by C-G formula based on SCr of 0.85 mg/dL). Liver Function Tests: Recent Labs  Lab 07/24/21 0846  AST 21  ALT 16  ALKPHOS 75  BILITOT 0.6  PROT 6.8  ALBUMIN 3.6   No results for input(s): LIPASE, AMYLASE in the last 168 hours. No results for input(s): AMMONIA in the last 168 hours. Coagulation Profile: Recent Labs  Lab 07/24/21 0846  INR 1.1   Cardiac Enzymes: No results for input(s): CKTOTAL, CKMB, CKMBINDEX, TROPONINI in the last 168 hours. BNP (last 3 results) No results for input(s): PROBNP in the last 8760 hours. HbA1C: No results for input(s): HGBA1C in the last 72 hours. CBG: No results for input(s): GLUCAP in the last 168 hours. Lipid Profile: No results for input(s): CHOL, HDL, LDLCALC, TRIG, CHOLHDL, LDLDIRECT in the last 72 hours. Thyroid Function Tests: No results for input(s): TSH, T4TOTAL, FREET4, T3FREE, THYROIDAB in the last 72 hours. Anemia Panel: No results for input(s): VITAMINB12, FOLATE, FERRITIN, TIBC, IRON, RETICCTPCT in the last 72 hours. Sepsis Labs: No results for input(s): PROCALCITON, LATICACIDVEN in the last 168 hours.  No results found for this or any previous visit (from  the past 240 hour(s)).       Radiology Studies: CT ANGIO HEAD NECK W WO CM  Result Date: 07/24/2021 CLINICAL DATA:  Stroke/TIA. Generalized weakness. Dizziness. Left hemispheric infarcts by MRI. EXAM: CT ANGIOGRAPHY HEAD AND NECK TECHNIQUE: Multidetector CT imaging of the head and neck was performed using the standard protocol during bolus administration of intravenous contrast. Multiplanar CT image reconstructions and MIPs were obtained to evaluate the vascular anatomy. Carotid stenosis measurements (when applicable) are obtained utilizing NASCET criteria, using the distal internal carotid diameter as the denominator. CONTRAST:  67m OMNIPAQUE IOHEXOL 350 MG/ML SOLN COMPARISON:  CT and MRI studies same day. Previous CT angiography 07/01/2021. FINDINGS: CTA NECK FINDINGS Aortic arch: Normal. No atherosclerotic change. Branching pattern is normal. Right carotid system: Common carotid artery widely patent to the bifurcation. Carotid bifurcation shows mild soft and calcified plaque but no stenosis. Cervical ICA widely patent. Left carotid system: Common carotid artery widely patent to the bifurcation. Carotid bifurcation shows soft and calcified plaque but no stenosis compared to the diameter of the distal ICA.  Vertebral arteries: Both vertebral artery origins are widely patent. Both vertebral arteries appear normal through the cervical region to the foramen magnum. Skeleton: Ordinary mild spondylosis. Other neck: No mass or lymphadenopathy.  Normal Upper chest: Normal Review of the MIP images confirms the above findings CTA HEAD FINDINGS Anterior circulation: Both internal carotid arteries are patent through the skull base and siphon regions. The anterior and middle cerebral vessels are patent and normal. No branch vessel occlusion or stenosis. No aneurysm or vascular malformation. Mild atherosclerotic irregularity of the more distal branch vessels. Posterior circulation: Both vertebral arteries are widely  patent through the foramen magnum to the basilar. No basilar stenosis. Posterior circulation branch vessels are normal. Mild atherosclerotic irregularity of the PCA vessels. Venous sinuses: Patent and normal. Anatomic variants: None significant. Review of the MIP images confirms the above findings IMPRESSION: No intracranial large or medium vessel occlusion or correctable proximal stenosis. Some atherosclerotic irregularity of the more distal intracranial branch vessels. Mild atherosclerotic disease at both carotid bifurcations but no stenosis. Electronically Signed   By: Nelson Chimes M.D.   On: 07/24/2021 20:11   MR Brain Wo Contrast (neuro protocol)  Result Date: 07/24/2021 CLINICAL DATA:  Dizziness, weakness EXAM: MRI HEAD WITHOUT CONTRAST TECHNIQUE: Multiplanar, multiecho pulse sequences of the brain and surrounding structures were obtained without intravenous contrast. COMPARISON:  Same-day noncontrast CT head: Brain MRI 06/30/2021 FINDINGS: Brain: There are small foci of diffusion restriction in the left centrum semiovale/periventricular white matter and left temporal lobe with faint associated FLAIR signal abnormality. There is more faint elevated DWI signal in the right superior frontal gyrus, left paracentral lobule, and left centrum semiovale more posteriorly. There is no evidence of acute intracranial hemorrhage or extra-axial fluid collection. There is a remote hemorrhage in the right thalamus. There are remote lacunar infarcts in the bilateral basal ganglia, the largest on the left. A curvilinear focus of SWI signal dropout in the left frontal lobe likely reflects a developmental venous anomaly. An adjacent nodular focus of SWI signal dropout may reflect a nonspecific punctate chronic microhemorrhage or small cavernoma Parenchymal volume loss and advanced chronic white matter microangiopathy are not significantly changed. There is no mass lesion. There is no midline shift. Vascular: Normal flow  voids. Skull and upper cervical spine: Normal marrow signal. Sinuses/Orbits: The imaged paranasal sinuses are clear. The globes and orbits are unremarkable. Other: None. IMPRESSION: 1. Small acute to subacute infarcts in the left centrum semiovale and temporal lobe, new since 06/30/2021. Additional more faint foci of diffusion restriction in the bilateral frontal lobes described above likely reflect more late subacute to early chronic infarcts. 2. No acute intracranial hemorrhage. 3. 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 Electronically Signed   By: Valetta Mole M.D.   On: 07/24/2021 15:43   ECHO TEE  Result Date: 07/25/2021    TRANSESOPHOGEAL ECHO REPORT   Patient Name:   [name] Date of Exam: 07/25/2021 Medical Rec #:  [id number]     Height:       62.0 in Accession #:    [id number]    Weight:       132.0 lb Date of Birth:  06/21/1956     BSA:          1.602 m Patient Age:    22 years      BP:           110/71 mmHg Patient Gender: F  HR:           58 bpm. Exam Location:  [facility] Procedure: Transesophageal Echo, Color Doppler, Cardiac Doppler and Saline            Contrast Bubble Study Indications:     Cerebral infarction, unspecified I63.9  History:         Patient has prior history of Echocardiogram examinations, most                  recent 07/01/2021. CAD and Previous Myocardial Infarction; Risk                  Factors:Hypertension.  Sonographer:     Sherrie Sport Referring Phys:  [id number] CHRISTOPHER RONALD BERGE Diagnosing Phys: Ida Rogue MD PROCEDURE: After discussion of the risks and benefits of a TEE, an informed consent was obtained from the patient. The transesophogeal probe was passed without difficulty through the esophogus of the patient. Local oropharyngeal anesthetic was provided with Benzocaine spray and Cetacaine. Sedation performed by performing physician. Image quality was excellent. The  patient's vital signs; including heart rate, blood pressure, and oxygen saturation; remained stable throughout the procedure. The patient developed no complications during the procedure. IMPRESSIONS  1. Left ventricular ejection fraction, by estimation, is 60 to 65%. The left ventricle has normal function. The left ventricle has no regional wall motion abnormalities.  2. Right ventricular systolic function is normal. The right ventricular size is normal.  3. No left atrial/left atrial appendage thrombus was detected.  4. The mitral valve is normal in structure. Trivial mitral valve regurgitation. No evidence of mitral stenosis.  5. The aortic valve is normal in structure. Aortic valve regurgitation is mild to moderate. No aortic stenosis is present.  6. There is mild (Grade II) atheroma plaque.  7. The inferior vena cava is normal in size with greater than 50% respiratory variability, suggesting right atrial pressure of 3 mmHg.  8. Agitated saline contrast bubble study was negative, with no evidence of any interatrial shunt. Conclusion(s)/Recommendation(s): Normal biventricular function without evidence of hemodynamically significant valvular heart disease. FINDINGS  Left Ventricle: Left ventricular ejection fraction, by estimation, is 60 to 65%. The left ventricle has normal function. The left ventricle has no regional wall motion abnormalities. The left ventricular internal cavity size was normal in size. There is  no left ventricular hypertrophy. Right Ventricle: The right ventricular size is normal. No increase in right ventricular wall thickness. Right ventricular systolic function is normal. Left Atrium: Left atrial size was normal in size. No left atrial/left atrial appendage thrombus was detected. Right Atrium: Right atrial size was normal in size. Pericardium: There is no evidence of pericardial effusion. Mitral Valve: The mitral valve is normal in structure. Trivial mitral valve regurgitation. No evidence  of mitral valve stenosis. Tricuspid Valve: The tricuspid valve is normal in structure. Tricuspid valve regurgitation is not demonstrated. No evidence of tricuspid stenosis. Aortic Valve: The aortic valve is normal in structure. Aortic valve regurgitation is mild to moderate. No aortic stenosis is present. Pulmonic Valve: The pulmonic valve was normal in structure. Pulmonic valve regurgitation is not visualized. No evidence of pulmonic stenosis. Aorta: The aortic root is normal in size and structure. There is mild (Grade II) atheroma plaque. Venous: The inferior vena cava is normal in size with greater than 50% respiratory variability, suggesting right atrial pressure of 3 mmHg. IAS/Shunts: No atrial level shunt detected by color flow Doppler. Agitated saline contrast was given intravenously to evaluate for intracardiac shunting.  Agitated saline contrast bubble study was negative, with no evidence of any interatrial shunt. Ida Rogue MD Electronically signed by Ida Rogue MD Signature Date/Time: 07/25/2021/2:52:45 PM    Final      Scheduled Meds:  aspirin EC  81 mg Oral Daily   clopidogrel  75 mg Oral Daily   feeding supplement (GLUCERNA SHAKE)  237 mL Oral BID BM   rosuvastatin  20 mg Oral Daily   Continuous Infusions:     LOS: 2 days    Time spent: 25 mins    Shawna Clamp, MD [facility]   If 7PM-7AM, please contact night-coverage

## 2021-07-27 DIAGNOSIS — I6319 Cerebral infarction due to embolism of other precerebral artery: Secondary | ICD-10-CM | POA: Diagnosis not present

## 2021-07-27 MED ORDER — CLOPIDOGREL BISULFATE 75 MG PO TABS: 75.0000 mg | ORAL_TABLET | Freq: Every day | ORAL | 0 refills | Status: DC

## 2021-07-27 NOTE — Discharge Instructions (Signed)
Advised to take aspirin and Plavix daily. Advised to follow-up with neurology in 3 to 4 weeks. Advised to follow-up with cardiology for loop recorder or Holter monitor for detection of arrhythmias.

## 2021-07-27 NOTE — Discharge Summary (Signed)
Physician Discharge Summary  [name] [id number] DOB: [birth year] DOA: 07/24/2021  PCP: Baxter Hire, MD  Admit date: 07/24/2021  Discharge date: 07/27/2021  Admitted From:  Home.  Disposition:  Home Health services.  Recommendations for Outpatient Follow-up:  Follow up with PCP in 1-2 weeks. Please obtain BMP/CBC in one week. Advised to take aspirin and Plavix daily. Advised to follow-up with Neurology in 3 to 4 weeks. Advised to follow-up with cardiology for loop recorder or Holter monitor for detection of arrhythmias.  Home Health: Home PT and OT Equipment/Devices: None  Discharge Condition: Stable CODE STATUS:Full code Diet recommendation: Heart Healthy   Brief Summary / Hospital course: This 65 years old female with PMH significant for recent CVA, acute right thalamic ICH, coronary artery disease s/p PCI with stent, bilateral carotid disease, GERD, hyperlipidemia, hypertension, ischemic cardiomyopathy, tobacco use disorder presented in the ED after waking up in the morning with severe vertigo associated with nausea and inability to ambulate.  Patient was last known well was last night.  Patient was brought in the ED. Initial CT head showed no acute findings,  MRI brain revealed small acute to subacute infarct in the left temporal lobe new since 8/22.  No acute intracranial hemorrhage.  Patient was deemed out of tPA window.  Patient was admitted for stroke work-up.  Stroke work-up has completed.  LDL 171. Hemoglobin A1c 6.2.  Neurology recommended cardiology consult for possible TEE to rule out intracardiac shunt since patient has bilateral strokes.  Patient underwent TEE which was negative for intracardiac shunt.  Patient was cleared from neurology to be discharged on aspirin and Plavix and statin.  Patient reports persistent dizziness but which is improving.  PT and OT evaluation completed recommended home OT and PT.  Patient will follow up with neurology and cardiology for  outpatient loop recorder or Holter monitoring for detection of arrhythmias.  Patient feels better and want to be discharged.  Patient is being discharged home.  Home and services been arranged.  She was managed for below problems.  Discharge Diagnoses:  Active Problems:   CVA (cerebral vascular accident) ([place])  Acute CVA, recurrent : Patient presented with vertigo, nausea, inability to ambulate after she wakes up.  Last known well was last night ,  out of tPA window. Patient seen by neurology,  recommended stroke work-up. CT head negative, MRI consistent with new stroke. Frequent neurochecks, permissive hypertension for 48 hours. Continue aspirin and Plavix daily, statins. Hemoglobin A1c 6.2, LDL 177, TSH pending Continue fall precautions.  PT and OT recommended home with home PT. Neurology recommended cardio evaluation for possible TEE, event monitor. Patient underwent TEE which was unremarkable,  negative for intracardiac shunt. Neurology recommended dual antiplatelet therapy.  Dizziness and nausea has improved.  Patient is being discharged home with home services.   Essential hypertension: Blood pressure has improved. blood pressure medications resumed.   Hyperlipidemia:  LDL 177 in 8/22 Resume home statin.   Hypokalemia: Replaced, continue to monitor   Chronic diastolic CHF: Last 2D echo 07/01/2021: LVEF 60 to [id number] grade 1 diastolic dysfunction. Closely monitor volume status since being on gentle IV hydration.   Ambulatory dysfunction in the setting of acute CVA Continue fall precautions. PT and OT recommended home with home PT.   Discharge Instructions  Discharge Instructions     Call MD for:  difficulty breathing, headache or visual disturbances   Complete by: As directed    Call MD for:  persistant dizziness or light-headedness  Complete by: As directed    Call MD for:  persistant nausea and vomiting   Complete by: As directed    Diet - low sodium heart  healthy   Complete by: As directed    Diet - low sodium heart healthy   Complete by: As directed    Diet Carb Modified   Complete by: As directed    Discharge instructions   Complete by: As directed    Advised to take aspirin and Plavix daily. Advised to follow-up with neurology in 3 to 4 weeks. Advised to follow-up with cardiology for loop recorder or Holter monitor for detection of arrhythmias.   Increase activity slowly   Complete by: As directed    Increase activity slowly   Complete by: As directed       Allergies as of 07/27/2021       Reactions   Codeine Sulfate Other (See Comments)   sick   Penicillins Nausea And Vomiting   Has patient had a PCN reaction causing immediate rash, facial/tongue/throat swelling, SOB or lightheadedness with hypotension: No Has patient had a PCN reaction causing severe rash involving mucus membranes or skin necrosis: No Has patient had a PCN reaction that required hospitalization No Has patient had a PCN reaction occurring within the last 10 years: No If all of the above answers are "NO", then may proceed with Cephalosporin use.        Medication List     STOP taking these medications    amLODipine 10 MG tablet Commonly known as: NORVASC   FLUoxetine 40 MG capsule Commonly known as: PROZAC   pantoprazole 40 MG tablet Commonly known as: PROTONIX   triamterene-hydrochlorothiazide 37.5-25 MG capsule Commonly known as: DYAZIDE       TAKE these medications    acetaminophen 500 MG tablet Commonly known as: TYLENOL Take 500 mg by mouth every 6 (six) hours as needed for moderate pain or mild pain.   aspirin 81 MG EC tablet Take 1 tablet (81 mg total) by mouth daily. Swallow whole.   clopidogrel 75 MG tablet Commonly known as: PLAVIX Take 1 tablet (75 mg total) by mouth daily.   furosemide 20 MG tablet Commonly known as: LASIX Take 20 mg by mouth daily.   metoprolol succinate 50 MG 24 hr tablet Commonly known as:  TOPROL-XL Take 50 mg by mouth daily. Take with or immediately following a meal.   nitroGLYCERIN 0.4 MG SL tablet Commonly known as: NITROSTAT Place 0.4 mg under the tongue every 5 (five) minutes as needed for chest pain.   rosuvastatin 20 MG tablet Commonly known as: CRESTOR Take 20 mg by mouth daily.        Follow-up Information     [facility] Follow up in 3 week(s).   Contact information: [address]        Baxter Hire, MD Follow up in 1 week(s).   Specialty: Internal Medicine Contact information: [place] [address] [phone]         Minna Merritts, MD .   Specialty: Cardiology Contact information: [place] [address] [phone]                Allergies  Allergen Reactions   Codeine Sulfate Other (See Comments)    sick   Penicillins Nausea And Vomiting    Has patient had a PCN reaction causing immediate rash, facial/tongue/throat  swelling, SOB or lightheadedness with hypotension: No Has patient had a PCN reaction causing severe rash involving mucus membranes or skin necrosis: No Has patient had a PCN reaction that required hospitalization No Has patient had a PCN reaction occurring within the last 10 years: No If all of the above answers are "NO", then may proceed with Cephalosporin use.     Consultations: Neurology Cardiology   Procedures/Studies: CT ANGIO HEAD NECK W WO CM  Result Date: 07/24/2021 CLINICAL DATA:  Stroke/TIA. Generalized weakness. Dizziness. Left hemispheric infarcts by MRI. EXAM: CT ANGIOGRAPHY HEAD AND NECK TECHNIQUE: Multidetector CT imaging of the head and neck was performed using the standard protocol during bolus administration of intravenous contrast. Multiplanar CT image reconstructions and MIPs were obtained to evaluate the vascular anatomy. Carotid stenosis  measurements (when applicable) are obtained utilizing NASCET criteria, using the distal internal carotid diameter as the denominator. CONTRAST:  30m OMNIPAQUE IOHEXOL 350 MG/ML SOLN COMPARISON:  CT and MRI studies same day. Previous CT angiography 07/01/2021. FINDINGS: CTA NECK FINDINGS Aortic arch: Normal. No atherosclerotic change. Branching pattern is normal. Right carotid system: Common carotid artery widely patent to the bifurcation. Carotid bifurcation shows mild soft and calcified plaque but no stenosis. Cervical ICA widely patent. Left carotid system: Common carotid artery widely patent to the bifurcation. Carotid bifurcation shows soft and calcified plaque but no stenosis compared to the diameter of the distal ICA. Vertebral arteries: Both vertebral artery origins are widely patent. Both vertebral arteries appear normal through the cervical region to the foramen magnum. Skeleton: Ordinary mild spondylosis. Other neck: No mass or lymphadenopathy.  Normal Upper chest: Normal Review of the MIP images confirms the above findings CTA HEAD FINDINGS Anterior circulation: Both internal carotid arteries are patent through the skull base and siphon regions. The anterior and middle cerebral vessels are patent and normal. No branch vessel occlusion or stenosis. No aneurysm or vascular malformation. Mild atherosclerotic irregularity of the more distal branch vessels. Posterior circulation: Both vertebral arteries are widely patent through the foramen magnum to the basilar. No basilar stenosis. Posterior circulation branch vessels are normal. Mild atherosclerotic irregularity of the PCA vessels. Venous sinuses: Patent and normal. Anatomic variants: None significant. Review of the MIP images confirms the above findings IMPRESSION: No intracranial large or medium vessel occlusion or correctable proximal stenosis. Some atherosclerotic irregularity of the more distal intracranial branch vessels. Mild atherosclerotic disease  at both carotid bifurcations but no stenosis. Electronically Signed   By: MNelson ChimesM.D.   On: 07/24/2021 20:11   CT ANGIO HEAD NECK W WO CM  Result Date: 07/01/2021 CLINICAL DATA:  Stroke, follow-up EXAM: CT ANGIOGRAPHY HEAD AND NECK TECHNIQUE: Multidetector CT imaging of the head and neck was performed using the standard protocol during bolus administration of intravenous contrast. Multiplanar CT image reconstructions and MIPs were obtained to evaluate the vascular anatomy. Carotid stenosis measurements (when applicable) are obtained utilizing NASCET criteria, using the distal internal carotid diameter as the denominator. CONTRAST:  759mOMNIPAQUE IOHEXOL 350 MG/ML SOLN COMPARISON:  Correlation made with recent MRI brain FINDINGS: CTA NECK Aortic arch: Great vessel origins are patent. Right carotid system: Mild eccentric plaque along the distal common carotid minor stenosis. Mild calcified plaque along the proximal internal carotid with minor stenosis. Left carotid system: Patent. Mild calcified plaque along the distal common carotid with minor stenosis. Minimal calcified plaque along the proximal internal carotid without stenosis. Vertebral arteries: Patent. Left vertebral artery is dominant. Minimal eccentric noncalcified plaque along the distal right  V3 segment. No stenosis. Skeleton: Mild cervical spine degenerative changes. Other neck: Unremarkable. Upper chest: No apical lung mass. Review of the MIP images confirms the above findings CTA HEAD Anterior circulation: Intracranial internal carotid arteries are patent with minimal calcified plaque. Anterior and middle cerebral arteries are patent. Mild right M2 branch stenosis. Posterior circulation: Intracranial vertebral arteries are patent. There is moderate stenosis of the distal right vertebral artery. Basilar artery is patent. Major cerebellar artery origins are patent. Posterior cerebral arteries are patent. Irregularity of the left greater than  right P2 and P3 PCA segments. There is moderate stenosis of the proximal left P2 PCA. Venous sinuses: Patent as allowed by contrast bolus timing. Review of the MIP images confirms the above findings IMPRESSION: No large vessel occlusion or hemodynamically significant stenosis. Mild to moderate intracranial atherosclerosis. Electronically Signed   By: Macy Mis M.D.   On: 07/01/2021 12:56   CT HEAD WO CONTRAST  Result Date: 07/24/2021 CLINICAL DATA:  Possible stroke.  Generalized weakness EXAM: CT HEAD WITHOUT CONTRAST TECHNIQUE: Contiguous axial images were obtained from the base of the skull through the vertex without intravenous contrast. COMPARISON:  06/30/2021 FINDINGS: Brain: No evidence of acute infarction, hemorrhage, hydrocephalus, extra-axial collection or mass lesion/mass effect.Chronic small vessel ischemia in the hemispheric white matter with chronic lacunar infarcts at the left caudate head and genu of the corpus callosum. Recent left cerebral facet recent cerebral infarcts by MRI are not detected due to small size and timing. Vascular: No hyperdense vessel or unexpected calcification. Skull: Normal. Negative for fracture or focal lesion. Sinuses/Orbits: No acute finding. IMPRESSION: 1. No acute or interval finding. 2. Chronic small vessel disease including chronic small-vessel infarcts. Electronically Signed   By: Jorje Guild M.D.   On: 07/24/2021 09:13   CT HEAD WO CONTRAST  Result Date: 06/30/2021 CLINICAL DATA:  Fall yesterday with left head injury. Dizziness this morning. History of CVA. EXAM: CT HEAD WITHOUT CONTRAST TECHNIQUE: Contiguous axial images were obtained from the base of the skull through the vertex without intravenous contrast. COMPARISON:  04/10/2021 head CT. FINDINGS: Brain: Nonspecific mild to moderate subcortical and periventricular white matter hypodensity, most in keeping with chronic small vessel ischemic change. Chronic small left basal ganglia lacunar infarct.  No evidence of parenchymal hemorrhage or extra-axial fluid collection. No mass lesion, mass effect, or midline shift. Previously visualized right thalamic hematoma has resolved. No CT evidence of acute infarction. Cerebral volume is age appropriate. No ventriculomegaly. Vascular: No acute abnormality. Skull: No evidence of calvarial fracture. Sinuses/Orbits: The visualized paranasal sinuses are essentially clear. Other:  The mastoid air cells are unopacified. IMPRESSION: 1. No evidence of acute intracranial abnormality. No evidence of calvarial fracture. 2. Mild-to-moderate chronic small vessel ischemic changes in the cerebral white matter. 3. Chronic small left basal ganglia lacunar infarct. Electronically Signed   By: Ilona Sorrel M.D.   On: 06/30/2021 12:35   MR Brain Wo Contrast (neuro protocol)  Result Date: 07/24/2021 CLINICAL DATA:  Dizziness, weakness EXAM: MRI HEAD WITHOUT CONTRAST TECHNIQUE: Multiplanar, multiecho pulse sequences of the brain and surrounding structures were obtained without intravenous contrast. COMPARISON:  Same-day noncontrast CT head: Brain MRI 06/30/2021 FINDINGS: Brain: There are small foci of diffusion restriction in the left centrum semiovale/periventricular white matter and left temporal lobe with faint associated FLAIR signal abnormality. There is more faint elevated DWI signal in the right superior frontal gyrus, left paracentral lobule, and left centrum semiovale more posteriorly. There is no evidence of acute intracranial hemorrhage or  extra-axial fluid collection. There is a remote hemorrhage in the right thalamus. There are remote lacunar infarcts in the bilateral basal ganglia, the largest on the left. A curvilinear focus of SWI signal dropout in the left frontal lobe likely reflects a developmental venous anomaly. An adjacent nodular focus of SWI signal dropout may reflect a nonspecific punctate chronic microhemorrhage or small cavernoma Parenchymal volume loss and  advanced chronic white matter microangiopathy are not significantly changed. There is no mass lesion. There is no midline shift. Vascular: Normal flow voids. Skull and upper cervical spine: Normal marrow signal. Sinuses/Orbits: The imaged paranasal sinuses are clear. The globes and orbits are unremarkable. Other: None. IMPRESSION: 1. Small acute to subacute infarcts in the left centrum semiovale and temporal lobe, new since 06/30/2021. Additional more faint foci of diffusion restriction in the bilateral frontal lobes described above likely reflect more late subacute to early chronic infarcts. 2. No acute intracranial hemorrhage. 3. 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 Electronically Signed   By: Valetta Mole M.D.   On: 07/24/2021 15:43   MR BRAIN WO CONTRAST  Result Date: 06/30/2021 CLINICAL DATA:  Dizziness, persistent/recurrent, cardiac or vascular cause suspected. Fall yesterday and dizziness today. EXAM: MRI HEAD WITHOUT CONTRAST TECHNIQUE: Multiplanar, multiecho pulse sequences of the brain and surrounding structures were obtained without intravenous contrast. COMPARISON:  Head CT 06/30/2021 and MRI 04/10/2021 FINDINGS: Brain: There are several subcentimeter foci of diffusion weighted signal abnormality involving white matter of the left frontal, parietal, and occipital lobes compatible with acute to early subacute infarcts. There is encephalomalacia in the right thalamus related to the previous hemorrhage. Chronic lacunar infarcts are again noted in the left basal ganglia, left internal capsule, left thalamus, and bilateral hemispheric white matter. A lacunar infarct in the posterior right corona radiata is new from the prior MRI but is late subacute to chronic in appearance. Patchy T2 hyperintensities elsewhere in the cerebral white matter bilaterally are similar to the prior MRI and are nonspecific but compatible  with severe chronic small vessel ischemic disease. A small focus of chronic hemorrhage in the left frontal lobe is unchanged from the prior MRI and is again noted to be adjacent to a curvilinear focus of susceptibility which may reflect a developmental venous anomaly and cavernoma. There is mild cerebral atrophy. No mass, midline shift, or extra-axial fluid collection is evident. Vascular: Major intracranial vascular flow voids are preserved. Skull and upper cervical spine: Unremarkable bone marrow signal. Sinuses/Orbits: Unremarkable orbits. Small volume fluid in the right sphenoid sinus. Clear mastoid air cells. Other: None. IMPRESSION: 1. Small acute to early subacute left cerebral hemispheric infarcts. 2. Late subacute to chronic right corona radiata lacunar infarct. 3. Severe chronic small vessel ischemic disease with multiple chronic lacunar infarcts as above. Electronically Signed   By: Logan Bores M.D.   On: 06/30/2021 14:32   DG Humerus Left  Result Date: 06/30/2021 CLINICAL DATA:  Fall.  Pain.  Remote fracture. EXAM: LEFT HUMERUS - 2+ VIEW COMPARISON:  Report of left humerus radiographs 05/15/2021 at [facility] clinic. Images are not currently available. FINDINGS: Calcific tendinosis present. Shoulder novel joints are located. No acute abnormalities are present. No healing fractures are present. IMPRESSION: 1. Calcific tendinosis. 2. No acute abnormality. Electronically Signed   By: San Morelle M.D.   On: 06/30/2021 15:13   ECHOCARDIOGRAM COMPLETE  Result Date: 07/01/2021    ECHOCARDIOGRAM REPORT   Patient Name:   [name]  Date of Exam: 07/01/2021 Medical Rec #:  [id number]     Height:       62.0 in Accession #:    [id number]    Weight:       146.2 lb Date of Birth:  04/23/1956     BSA:          1.673 m Patient Age:    36 years      BP:           127/65 mmHg Patient Gender: F             HR:           60 bpm. Exam Location:  [facility] Procedure: 2D Echo, Color Doppler and Cardiac Doppler  Indications:     Stroke I63.9  History:         Patient has prior history of Echocardiogram examinations, most                  recent 04/10/2021. CAD; Risk Factors:Hypertension.  Sonographer:     Sherrie Sport Referring Phys:  [place] Diagnosing Phys: Nelva Bush MD  Sonographer Comments: Suboptimal apical window. IMPRESSIONS  1. Left ventricular ejection fraction, by estimation, is 60 to 65%. The left ventricle has normal function. The left ventricle has no regional wall motion abnormalities. There is moderate left ventricular hypertrophy. Left ventricular diastolic parameters are consistent with Grade I diastolic dysfunction (impaired relaxation).  2. Right ventricular systolic function is normal. The right ventricular size is normal. Tricuspid regurgitation signal is inadequate for assessing PA pressure.  3. The mitral valve was not well visualized. Trivial mitral valve regurgitation. No evidence of mitral stenosis.  4. The aortic valve is tricuspid. Aortic valve regurgitation is mild to moderate. FINDINGS  Left Ventricle: Left ventricular ejection fraction, by estimation, is 60 to 65%. The left ventricle has normal function. The left ventricle has no regional wall motion abnormalities. The left ventricular internal cavity size was normal in size. There is  moderate left ventricular hypertrophy. Left ventricular diastolic parameters are consistent with Grade I diastolic dysfunction (impaired relaxation). Right Ventricle: The right ventricular size is normal. No increase in right ventricular wall thickness. Right ventricular systolic function is normal. Tricuspid regurgitation signal is inadequate for assessing PA pressure. Left Atrium: Left atrial size was normal in size. Right Atrium: Right atrial size was normal in size. Pericardium: The pericardium was not well visualized. Mitral Valve: The mitral valve was not well visualized. Mild mitral annular calcification. Trivial mitral valve regurgitation.  No evidence of mitral valve stenosis. Tricuspid Valve: The tricuspid valve is not well visualized. Tricuspid valve regurgitation is trivial. Aortic Valve: The aortic valve is tricuspid. Aortic valve regurgitation is mild to moderate. Aortic valve mean gradient measures 2.0 mmHg. Aortic valve peak gradient measures 4.2 mmHg. Aortic valve area, by VTI measures 2.63 cm. Pulmonic Valve: The pulmonic valve was normal in structure. Pulmonic valve regurgitation is mild. No evidence of pulmonic stenosis. Aorta: The aortic root is normal in size and structure. Pulmonary Artery: The pulmonary artery is of normal size. Venous: The inferior vena cava was not well visualized. IAS/Shunts: The interatrial septum was not well visualized.  LEFT VENTRICLE PLAX 2D LVIDd:         3.99 cm  Diastology LVIDs:         2.89 cm  LV e' medial:    5.22 cm/s LV PW:         1.63 cm  LV E/e' medial:  10.6  LV IVS:        0.89 cm  LV e' lateral:   5.98 cm/s LVOT diam:     2.00 cm  LV E/e' lateral: 9.2 LV SV:         50 LV SV Index:   30 LVOT Area:     3.14 cm  RIGHT VENTRICLE RV Basal diam:  2.61 cm RV S prime:     7.72 cm/s TAPSE (M-mode): 2.5 cm LEFT ATRIUM             Index       RIGHT ATRIUM           Index LA diam:        3.30 cm 1.97 cm/m  RA Area:     12.40 cm LA Vol (A2C):   44.9 ml 26.84 ml/m RA Volume:   29.20 ml  17.45 ml/m LA Vol (A4C):   28.7 ml 17.15 ml/m LA Biplane Vol: 36.4 ml 21.76 ml/m  AORTIC VALVE                   PULMONIC VALVE AV Area (Vmax):    2.21 cm    PV Vmax:        0.77 m/s AV Area (Vmean):   1.98 cm    PV Peak grad:   2.4 mmHg AV Area (VTI):     2.63 cm    RVOT Peak grad: 2 mmHg AV Vmax:           102.00 cm/s AV Vmean:          68.900 cm/s AV VTI:            0.190 m AV Peak Grad:      4.2 mmHg AV Mean Grad:      2.0 mmHg LVOT Vmax:         71.80 cm/s LVOT Vmean:        43.400 cm/s LVOT VTI:          0.159 m LVOT/AV VTI ratio: 0.84  AORTA Ao Root diam: 3.20 cm MITRAL VALVE MV Area (PHT): 3.76 cm    SHUNTS  MV Decel Time: 202 msec    Systemic VTI:  0.16 m MV E velocity: 55.30 cm/s  Systemic Diam: 2.00 cm MV A velocity: 67.70 cm/s MV E/A ratio:  0.82 Christopher End MD Electronically signed by Nelva Bush MD Signature Date/Time: 07/01/2021/4:50:08 PM    Final    ECHO TEE  Result Date: 07/25/2021    TRANSESOPHOGEAL ECHO REPORT   Patient Name:   [name] Date of Exam: 07/25/2021 Medical Rec #:  [id number]     Height:       62.0 in Accession #:    [id number]    Weight:       132.0 lb Date of Birth:  [birth year]     BSA:          1.602 m Patient Age:    49 years      BP:           110/71 mmHg Patient Gender: F             HR:           58 bpm. Exam Location:  [facility] Procedure: Transesophageal Echo, Color Doppler, Cardiac Doppler and [place]            Contrast Bubble Study Indications:     Cerebral infarction, unspecified I63.9  History:  Patient has prior history of Echocardiogram examinations, most                  recent 07/01/2021. CAD and Previous Myocardial Infarction; Risk                  Factors:Hypertension.  Sonographer:     Sherrie Sport Referring Phys:  [id number] CHRISTOPHER RONALD BERGE Diagnosing Phys: Ida Rogue MD PROCEDURE: After discussion of the risks and benefits of a TEE, an informed consent was obtained from the patient. The transesophogeal probe was passed without difficulty through the esophogus of the patient. Local oropharyngeal anesthetic was provided with Benzocaine spray and Cetacaine. Sedation performed by performing physician. Image quality was excellent. The patient's vital signs; including heart rate, blood pressure, and oxygen saturation; remained stable throughout the procedure. The patient developed no complications during the procedure. IMPRESSIONS  1. Left ventricular ejection fraction, by estimation, is 60 to 65%. The left ventricle has normal function. The left ventricle has no regional wall motion abnormalities.  2. Right ventricular systolic function is normal. The right  ventricular size is normal.  3. No left atrial/left atrial appendage thrombus was detected.  4. The mitral valve is normal in structure. Trivial mitral valve regurgitation. No evidence of mitral stenosis.  5. The aortic valve is normal in structure. Aortic valve regurgitation is mild to moderate. No aortic stenosis is present.  6. There is mild (Grade II) atheroma plaque.  7. The inferior vena cava is normal in size with greater than 50% respiratory variability, suggesting right atrial pressure of 3 mmHg.  8. Agitated saline contrast bubble study was negative, with no evidence of any interatrial shunt. Conclusion(s)/Recommendation(s): Normal biventricular function without evidence of hemodynamically significant valvular heart disease. FINDINGS  Left Ventricle: Left ventricular ejection fraction, by estimation, is 60 to 65%. The left ventricle has normal function. The left ventricle has no regional wall motion abnormalities. The left ventricular internal cavity size was normal in size. There is  no left ventricular hypertrophy. Right Ventricle: The right ventricular size is normal. No increase in right ventricular wall thickness. Right ventricular systolic function is normal. Left Atrium: Left atrial size was normal in size. No left atrial/left atrial appendage thrombus was detected. Right Atrium: Right atrial size was normal in size. Pericardium: There is no evidence of pericardial effusion. Mitral Valve: The mitral valve is normal in structure. Trivial mitral valve regurgitation. No evidence of mitral valve stenosis. Tricuspid Valve: The tricuspid valve is normal in structure. Tricuspid valve regurgitation is not demonstrated. No evidence of tricuspid stenosis. Aortic Valve: The aortic valve is normal in structure. Aortic valve regurgitation is mild to moderate. No aortic stenosis is present. Pulmonic Valve: The pulmonic valve was normal in structure. Pulmonic valve regurgitation is not visualized. No evidence of  pulmonic stenosis. Aorta: The aortic root is normal in size and structure. There is mild (Grade II) atheroma plaque. Venous: The inferior vena cava is normal in size with greater than 50% respiratory variability, suggesting right atrial pressure of 3 mmHg. IAS/Shunts: No atrial level shunt detected by color flow Doppler. Agitated saline contrast was given intravenously to evaluate for intracardiac shunting. Agitated saline contrast bubble study was negative, with no evidence of any interatrial shunt. Ida Rogue MD Electronically signed by Ida Rogue MD Signature Date/Time: 07/25/2021/2:52:45 PM    Final    LONG TERM MONITOR-LIVE TELEMETRY (3-14 DAYS)  Result Date: 07/23/2021 Event Monitor Patch Wear Time:  14 days and 0 hours (2022-08-24T09:32:15-0400 to 2022-09-07T09:32:15-0400) Normal  sinus rhythm Patient had a min HR of 43 bpm, max HR of 141 bpm, and avg HR of 59 bpm. 1 run of Ventricular Tachycardia occurred lasting 4 beats with a max rate of 141 bpm (avg 118 bpm). Isolated SVEs were rare (<1.0%), SVE Triplets were rare (<1.0%), and no SVE Couplets were present. Isolated VEs were rare (<1.0%), and no VE Couplets or VE Triplets were present. Patient triggered event associated with normal sinus Signed, Esmond Plants, MD, Ph.D [facility]    TEE, CT head, MRI.  Subjective: Patient was seen and examined at bedside.  Overnight events noted.   She reports feeling much improved. She still reports having dizziness and nausea but has much improved. Patient feels better and want to be discharged.  Patient being discharged home,  appointment has been made.  Discharge Exam: Vitals:   07/26/21 1947 07/27/21 0724  BP: (!) 165/82 137/73  Pulse: 63 (!) 58  Resp: 16 16  Temp: 99 F (37.2 C) 98.5 F (36.9 C)  SpO2: 97% 97%   Vitals:   07/26/21 1103 07/26/21 1706 07/26/21 1947 07/27/21 0724  BP: (!) 147/74 (!) 153/78 (!) 165/82 137/73  Pulse: 65 62 63 (!) 58  Resp: '16 20 16 16  '$ Temp: 98.4 F  (36.9 C) 98.5 F (36.9 C) 99 F (37.2 C) 98.5 F (36.9 C)  TempSrc: Oral  Oral Oral  SpO2: 96% 96% 97% 97%  Weight:      Height:        General: Appears comfortable, not in any acute distress. Cardiovascular: Regular rate and rhythm, no murmur. Respiratory: CTA bilaterally, no wheezing, no rhonchi Abdominal: Soft, NT, ND, bowel sounds + Extremities: no edema, no cyanosis    The results of significant diagnostics from this hospitalization (including imaging, microbiology, ancillary and laboratory) are listed below for reference.     Microbiology: No results found for this or any previous visit (from the past 240 hour(s)).   Labs: BNP (last 3 results) No results for input(s): BNP in the last 8760 hours. Basic Metabolic Panel: Recent Labs  Lab 07/24/21 0846 07/25/21 0624 07/26/21 0412  NA 140 141 141  K 2.9* 3.3* 3.6  CL 104 109 110  CO2 '26 26 24  '$ GLUCOSE 99 90 84  BUN '14 19 18  '$ CREATININE 0.84 0.75 0.85  CALCIUM 9.0 8.9 8.8*  MG  --  2.1  --   PHOS  --  4.2  --    Liver Function Tests: Recent Labs  Lab 07/24/21 0846  AST 21  ALT 16  ALKPHOS 75  BILITOT 0.6  PROT 6.8  ALBUMIN 3.6   No results for input(s): LIPASE, AMYLASE in the last 168 hours. No results for input(s): AMMONIA in the last 168 hours. CBC: Recent Labs  Lab 07/24/21 0846 07/25/21 0624  WBC 8.1 8.6  NEUTROABS 4.9  --   HGB 11.7* 11.4*  HCT 32.0* 32.6*  MCV 93.3 95.3  PLT 276 272   Cardiac Enzymes: No results for input(s): CKTOTAL, CKMB, CKMBINDEX, TROPONINI in the last 168 hours. BNP: Invalid input(s): POCBNP CBG: No results for input(s): GLUCAP in the last 168 hours. D-Dimer No results for input(s): DDIMER in the last 72 hours. Hgb A1c No results for input(s): HGBA1C in the last 72 hours. Lipid Profile No results for input(s): CHOL, HDL, LDLCALC, TRIG, CHOLHDL, LDLDIRECT in the last 72 hours. Thyroid function studies Recent Labs    07/26/21 0412  TSH 1.618   Anemia  work up  No results for input(s): VITAMINB12, FOLATE, FERRITIN, TIBC, IRON, RETICCTPCT in the last 72 hours. Urinalysis    Component Value Date/Time   COLORURINE YELLOW (A) 06/30/2021 1815   APPEARANCEUR CLEAR (A) 06/30/2021 1815   LABSPEC 1.009 06/30/2021 1815   PHURINE 6.0 06/30/2021 1815   GLUCOSEU NEGATIVE 06/30/2021 1815   HGBUR NEGATIVE 06/30/2021 1815   BILIRUBINUR NEGATIVE 06/30/2021 1815   KETONESUR NEGATIVE 06/30/2021 1815   PROTEINUR NEGATIVE 06/30/2021 1815   UROBILINOGEN 0.2 02/18/2015 0410   NITRITE NEGATIVE 06/30/2021 1815   LEUKOCYTESUR NEGATIVE 06/30/2021 1815   Sepsis Labs Invalid input(s): PROCALCITONIN,  WBC,  LACTICIDVEN Microbiology No results found for this or any previous visit (from the past 240 hour(s)).   Time coordinating discharge: Over 30 minutes  SIGNED:   Shawna Clamp, MD  [facility] 07/27/2021, 2:12 PM Pager   If 7PM-7AM, please contact night-coverage

## 2021-07-27 NOTE — TOC Initial Note (Signed)
Transition of Care [facility]) - Initial/Assessment Note    Patient Details  Name: [name] MRN: [id number] Date of Birth: [birth year]  Transition of Care [facility]) CM/SW Contact:    Harriet Masson, RN Phone Number: [phone] 07/27/2021, 9:11 AM  Clinical Narrative:                 RN spoke with pt today concerning the recommendations for outpt therapy. Pt declined however receptive to HHPT/OT with [facility] (history with this agency). Spoke with Mickel Baas for admit for services. Pt reports she has a supportive neighbor who will transport her home, medical appointments and assist with all her medications.Pt has a shower chair and walker for ambulation. No other needs presented. Pt will be discharged today.   Expected Discharge Plan: [place] Barriers to Discharge: Barriers Resolved   Patient Goals and CMS Choice     Choice offered to / list presented to : Patient  Expected Discharge Plan and Services Expected Discharge Plan: [place]   Discharge Planning Services: CM Consult   Living arrangements for the past 2 months: Apartment Expected Discharge Date: 07/27/21                 DME Agency: [facility] Date DME Agency Contacted: 07/27/21 Time DME Agency Contacted: [phone] Representative spoke with at DME Agency: Mickel Baas HH Arranged: PT, OT HH Agency: [facility] Date [place]: 07/27/21 Time [place]: [phone] Representative spoke with at [place]: Mickel Baas  Prior Living Arrangements/Services Living arrangements for the past 2 months: Apartment Lives with:: Self Patient language and need for interpreter reviewed:: No Do you feel safe going back to the place where you live?: Yes      Need for Family Participation in Patient Care: No (Comment) Care giver support system in place?: No (comment) Current home services: DME (shower chair) Criminal Activity/Legal Involvement Pertinent to Current  Situation/Hospitalization: No - Comment as needed  Activities of Daily Living Home Assistive Devices/Equipment: None ADL Screening (condition at time of admission) Patient's cognitive ability adequate to safely complete daily activities?: Yes Is the patient deaf or have difficulty hearing?: No Does the patient have difficulty seeing, even when wearing glasses/contacts?: No Does the patient have difficulty concentrating, remembering, or making decisions?: No Patient able to express need for assistance with ADLs?: Yes Does the patient have difficulty dressing or bathing?: No Independently performs ADLs?: Yes (appropriate for developmental age) Does the patient have difficulty walking or climbing stairs?: Yes Weakness of Legs: Both Weakness of Arms/Hands: None  Permission Sought/Granted   Permission granted to share information with : Yes, Verbal Permission Granted              Emotional Assessment Appearance:: Appears stated age Attitude/Demeanor/Rapport: Engaged Affect (typically observed): Accepting Orientation: : Oriented to Self, Oriented to Place, Oriented to  Time, Oriented to Situation Alcohol / Substance Use: Not Applicable Psych Involvement: No (comment)  Admission diagnosis:  Vertigo of central origin [H81.4] CVA (cerebral vascular accident) [facility]) [I63.9] Cerebrovascular accident (CVA), unspecified mechanism ([place]) [I63.9] Patient Active Problem List   Diagnosis Date Noted   CVA (cerebral vascular accident) ([place]) 06/30/2021   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   ICH (intracerebral hemorrhage) ([place]) 04/10/2021   Pelvic pain-right lower quadrant 07/14/2018   Right lower quadrant abdominal pain 06/30/2018   Bilateral carotid artery stenosis [id number]   Chronic systolic CHF (congestive heart failure) ([place]) 03/12/2018   Status post total knee replacement using  cement, left 10/06/2016   History of coronary artery stent placement    Pure hypercholesterolemia     Recurrent UTI 02/11/2016   Menopause 02/11/2016   Angina pectoris ([place]) 01/07/2016   Syncope 07/11/2015   Orthostatic hypotension 02/19/2015   Dehydration 02/19/2015   Hypokalemia 02/19/2015   Stroke ([place]) 02/18/2015   Tobacco abuse 02/18/2015   History of stroke 07/18/2014   Vertigo 07/18/2014   Bilateral wrist pain 07/18/2014   Atypical chest pain 06/05/2014   Coronary artery disease of native artery of native heart with stable angina pectoris ([place]) 01/19/2014   Carotid arterial disease ([place]) 01/19/2014   Depression 01/16/2014   Memory loss 01/16/2014   Right sided weakness 08/10/2013   Essential hypertension 08/10/2013   Mixed hyperlipidemia 08/10/2013   History of MI (myocardial infarction) 08/10/2013   Stroke, acute, embolic ([place]) [id number]   PCP:  Baxter Hire, MD Pharmacy:   [facility] [id number], [address] [place] [address] Phone: [phone] Fax: [phone]  [facility]) - [place], [address] [place] [address] Phone: [phone] Fax: [phone]  [id number] - [place], [place] - [address] AT [place] [address] [place] [address] Phone: [phone] Fax: [phone]     Social Determinants of Health ([place]) Interventions    Readmission Risk Interventions No flowsheet data found.

## 2021-07-28 DIAGNOSIS — Z87440 Personal history of urinary (tract) infections: Secondary | ICD-10-CM | POA: Diagnosis not present

## 2021-07-28 DIAGNOSIS — Z79891 Long term (current) use of opiate analgesic: Secondary | ICD-10-CM | POA: Diagnosis not present

## 2021-07-28 DIAGNOSIS — F5101 Primary insomnia: Secondary | ICD-10-CM | POA: Diagnosis not present

## 2021-07-28 DIAGNOSIS — Z7982 Long term (current) use of aspirin: Secondary | ICD-10-CM | POA: Diagnosis not present

## 2021-07-28 DIAGNOSIS — I252 Old myocardial infarction: Secondary | ICD-10-CM | POA: Diagnosis not present

## 2021-07-28 DIAGNOSIS — K219 Gastro-esophageal reflux disease without esophagitis: Secondary | ICD-10-CM | POA: Diagnosis not present

## 2021-07-28 DIAGNOSIS — F17210 Nicotine dependence, cigarettes, uncomplicated: Secondary | ICD-10-CM | POA: Diagnosis not present

## 2021-07-28 DIAGNOSIS — I130 Hypertensive heart and chronic kidney disease with heart failure and stage 1 through stage 4 chronic kidney disease, or unspecified chronic kidney disease: Secondary | ICD-10-CM | POA: Diagnosis not present

## 2021-07-28 DIAGNOSIS — F339 Major depressive disorder, recurrent, unspecified: Secondary | ICD-10-CM | POA: Diagnosis not present

## 2021-07-28 DIAGNOSIS — H409 Unspecified glaucoma: Secondary | ICD-10-CM | POA: Diagnosis not present

## 2021-07-28 DIAGNOSIS — I69254 Hemiplegia and hemiparesis following other nontraumatic intracranial hemorrhage affecting left non-dominant side: Secondary | ICD-10-CM | POA: Diagnosis not present

## 2021-07-28 DIAGNOSIS — I739 Peripheral vascular disease, unspecified: Secondary | ICD-10-CM | POA: Diagnosis not present

## 2021-07-28 DIAGNOSIS — N189 Chronic kidney disease, unspecified: Secondary | ICD-10-CM | POA: Diagnosis not present

## 2021-07-28 DIAGNOSIS — I5022 Chronic systolic (congestive) heart failure: Secondary | ICD-10-CM | POA: Diagnosis not present

## 2021-07-28 DIAGNOSIS — I69222 Dysarthria following other nontraumatic intracranial hemorrhage: Secondary | ICD-10-CM | POA: Diagnosis not present

## 2021-07-28 DIAGNOSIS — I2510 Atherosclerotic heart disease of native coronary artery without angina pectoris: Secondary | ICD-10-CM | POA: Diagnosis not present

## 2021-07-28 DIAGNOSIS — Z9181 History of falling: Secondary | ICD-10-CM | POA: Diagnosis not present

## 2021-07-28 DIAGNOSIS — Z8616 Personal history of COVID-19: Secondary | ICD-10-CM | POA: Diagnosis not present

## 2021-07-28 DIAGNOSIS — G4700 Insomnia, unspecified: Secondary | ICD-10-CM | POA: Diagnosis not present

## 2021-07-28 DIAGNOSIS — S42202D Unspecified fracture of upper end of left humerus, subsequent encounter for fracture with routine healing: Secondary | ICD-10-CM | POA: Diagnosis not present

## 2021-07-28 DIAGNOSIS — E559 Vitamin D deficiency, unspecified: Secondary | ICD-10-CM | POA: Diagnosis not present

## 2021-07-28 DIAGNOSIS — E7800 Pure hypercholesterolemia, unspecified: Secondary | ICD-10-CM | POA: Diagnosis not present

## 2021-07-28 DIAGNOSIS — F411 Generalized anxiety disorder: Secondary | ICD-10-CM | POA: Diagnosis not present

## 2021-07-28 DIAGNOSIS — G40909 Epilepsy, unspecified, not intractable, without status epilepticus: Secondary | ICD-10-CM | POA: Diagnosis not present

## 2021-07-28 DIAGNOSIS — M1712 Unilateral primary osteoarthritis, left knee: Secondary | ICD-10-CM | POA: Diagnosis not present

## 2021-07-28 NOTE — Progress Notes (Signed)
Cardiology Office Note    Date:  07/29/2021   ID:  [name], [name] Nov 10, 1955, MRN [id number]  PCP:  Baxter Hire, MD  Cardiologist:  Ida Rogue, MD  Electrophysiologist:  None   Chief Complaint: Hospital follow-up  History of Present Illness:   [name] is a 65 y.o. female with history of CAD with NSTEMI in 03/2013 medically managed with prior stenting to the distal RCA in 12/2013, HFrEF secondary to ICM with subsequent normalization of LVSF, recurrent CVAs (08/2013, 06/2021, 07/2021), [place] in 04/2021, COVID in 07/2019, HTN, HLD migraine disorder, tobacco use, depression, and GERD who presents for hospital follow-up for recurrent CVA.  She was admitted to the hospital in 03/2013 with an NSTEMI.  Cath demonstrated two-vessel CAD with an occluded mid RCA with collaterals from the distal LAD.  Medical management was advised.  Remaining cath details included 75% stenosis diagonal #1 near the ostium, 40% mid LAD, 30% distal LAD, 50% D2 disease, 30% proximal circumflex, 75% proximal RCA disease.  Repeat cath in 12/2013 showed 95% distal RCA stenosis status post PCI/DES as well as 40% mid LAD, 30% distal LAD, 80% D1 disease, 60% D2 disease, 50% ostial RCA disease, 50% proximal RCA disease, 60% mid RCA disease.  Most recent cath from 08/2016 showed left LAD 30% proximal stenosis, 40% mid LAD disease, 70% ostial and proximal D2 and D3 disease, RCA 40 to 50% ostial disease, 30% proximal RCA disease. LVSF normal.  Given stability of CAD, medical management was advised.  She was admitted to [facility] in 08/2013 with a CVA.  Surface echo at that time showed an EF of 60 to 65%, no regional wall motion abnormalities, and mild mitral regurgitation.  Carotid artery ultrasound showed bilateral 1 to 39% ICA stenosis with antegrade vertebral artery flow.  She was admitted in 04/2021 with right thalamic hemorrhagic CVA with right to left midline shift felt to be in the setting of hypertensive emergency n.  Echo  showed an EF of 55 to 60%, no regional wall motion normalities, grade 1 diastolic dysfunction, normal RV systolic function and ventricular cavity size, trivial circumferential pericardial effusion, mild aortic valve insufficiency, mild aortic valve sclerosis without evidence of stenosis, and an estimated right atrial pressure of 3 mmHg.  Carotid artery ultrasound showed less than 50% bilateral ICA stenosis.  She was admitted in 06/2021 with a small acute left cerebral infarct and a subacute to chronic right corona radiata lacunar infarct.  Echo showed an EF of 60 to 65%, no regional wall motion abnormalities, moderate LVH, grade 1 diastolic dysfunction, normal RV systolic function and ventricular cavity size, trivial mitral regurgitation, and mild to moderate aortic insufficiency.  Given recent [place] in 04/2021, neurology recommended single antiplatelet therapy.  Outpatient cardiac monitoring demonstrated a predominant rhythm of sinus with an average heart rate of 59 (range 43-1 41), 1 run of NSVT lasting 4 beats, rare PACs and PVCs, and no significant sustained arrhythmias.  Patient triggered events were associated with sinus rhythm.  Most recently, she was admitted in 07/2021 with small acute to subacute infarct in the left temporal lobe that was new compared to study in 06/2021.  No acute intracranial hemorrhage.  She was deemed to be outside of tPA window.  Given history of bilateral strokes, she underwent TEE which was negative for intracardiac shunt.  Neurology recommended discharging the patient on aspirin, Plavix, and statin.  At time of discharge, she noted persistent dizziness, though this was improving.  She follows  up today for consideration of continued outpatient cardiac monitoring for recurrent CVAs.  She comes in doing well from a cardiac perspective.  No chest pain, dyspnea, palpitations, presyncope, or syncope.  No lower extremity swelling.  She does continue to note dizziness which has been  present since her above third CVA this summer.  She is working with PT at home.  She notes her blood pressure has been elevated in the [id number] to [id number] systolic.  She is tolerating aspirin, clopidogrel, furosemide, Toprol-XL, and rosuvastatin.  No falls since her hospital discharge.  She has follow-up with neurology next week.  She continues to note residual left-sided weakness following her most recent CVA.  She has not smoked since her hospital admission in 04/2021.  She is working on improving her diet.   Labs independently reviewed: 07/2021 - TSH normal, potassium 3.6, BUN 18, serum creatinine 0.85, magnesium 2.1, Hgb 11.4, PLT 272, albumin 3.6, AST/ALT normal 06/2021 - TC 147, TG 207, HDL 32, LDL 74, A1c 6.2  Past Medical History:  Diagnosis Date   Anxiety    Arthritis    CAD (coronary artery disease)    a. 03/2013 NSTEMI/Cath: 100 RCA, EF 45%->Med Rx;  b. 12/2013 Cath/PCI: LAD 30m 30d, D1 80, D2 60, LCX nl, RCA 50ost/p, 655m95d (2.5x33 Xience DES);  c. 05/2014 Lexi MV: EF 68%, no ischemia; d. stress echo 12/2015 no ischemia @ max exercise (did not achieve target)   Carotid disease, bilateral ([facility]   a. 08/2013 Carotid U/S: 1-39% bilat ICA stenosis.   Chronic kidney disease    Chronic kidney infections. Takes daily preventative.   Complication of anesthesia    CVA (cerebral vascular accident) ([facility]   a. 08/2013.   CVA (cerebral vascular accident) ([facility]   Decreased libido    Depression    GERD (gastroesophageal reflux disease)    History of 2019 novel coronavirus disease (COVID-19) 07/2019   History of recurrent UTIs    Hypercholesteremia    Hyperlipemia    Hypertension    Insomnia    Ischemic cardiomyopathy    a. 03/2013 Echo: EF 30-35%, mod dil LA, mod-sev MR, mod TR;  b. 08/2013 Echo EF 60-65%, mild AI.   Menopause    Migraines    Myocardial infarction (HCC)    PONV (postoperative nausea and vomiting)    If anesthsia is given slowly, pt will not throw up, if given quickly, pt will  have nausea and vomiting.   Right lower quadrant pain    Syncope and collapse    Tobacco abuse    Vaginal atrophy     Past Surgical History:  Procedure Laterality Date   CARDIAC CATHETERIZATION  01/01/2014   CARDIAC CATHETERIZATION  03/16/2013   CARDIAC CATHETERIZATION  12/2013   CARDIAC CATHETERIZATION N/A 08/19/2016   Procedure: Left Heart Cath and Coronary Angiography;  Surgeon: TiMinna MerrittsMD;  Location[facility];  Service: Cardiovascular;  Laterality: N/A;   CESAREAN SECTION WITH BILATERAL TUBAL LIGATION     CORONARY ANGIOPLASTY WITH STENT PLACEMENT  01/01/2014   95% lesion with a drug eluting stent to the distal RCA.   ENDOMETRIAL ABLATION     KNEE ARTHROSCOPY  11/27/2006   left knee    OOPHORECTOMY     TEE WITHOUT CARDIOVERSION N/A 07/25/2021   Procedure: TRANSESOPHAGEAL ECHOCARDIOGRAM (TEE);  Surgeon: GoMinna MerrittsMD;  Location: [facility];  Service: Cardiovascular;  Laterality: N/A;   TOTAL KNEE ARTHROPLASTY Left 10/06/2016  Procedure: TOTAL KNEE ARTHROPLASTY;  Surgeon: Corky Mull, MD;  Location: [facility];  Service: Orthopedics;  Laterality: Left;   TOTAL KNEE ARTHROPLASTY Right 11/12/2020   Procedure: TOTAL KNEE ARTHROPLASTY;  Surgeon: Corky Mull, MD;  Location: [facility];  Service: Orthopedics;  Laterality: Right;   TUBAL LIGATION      Current Medications: Current Meds  Medication Sig   acetaminophen (TYLENOL) 500 MG tablet Take 500 mg by mouth every 6 (six) hours as needed for moderate pain or mild pain.   amLODipine (NORVASC) 5 MG tablet Take 1 tablet (5 mg total) by mouth daily.   aspirin EC 81 MG EC tablet Take 1 tablet (81 mg total) by mouth daily. Swallow whole.   clopidogrel (PLAVIX) 75 MG tablet Take 1 tablet (75 mg total) by mouth daily.   furosemide (LASIX) 20 MG tablet Take 20 mg by mouth daily.   metoprolol succinate (TOPROL-XL) 50 MG 24 hr tablet Take 50 mg by mouth daily. Take with or immediately following a meal.   nitroGLYCERIN  (NITROSTAT) 0.4 MG SL tablet Place 0.4 mg under the tongue every 5 (five) minutes as needed for chest pain.   rosuvastatin (CRESTOR) 20 MG tablet Take 20 mg by mouth daily.    Allergies:   Codeine sulfate and Penicillins   Social History   Socioeconomic History   Marital status: Single    Spouse name: Not on file   Number of children: 1   Years of education: Not on file   Highest education level: Not on file  Occupational History   Occupation: lab tech    Employer: [employer]  Tobacco Use   Smoking status: Former    Packs/day: 1.50    Years: 20.00    Pack years: 30.00    Types: Cigarettes    Quit date: 04/07/2013    Years since quitting: 8.3   Smokeless tobacco: Never  Vaping Use   Vaping Use: Never used  Substance and Sexual Activity   Alcohol use: Not Currently    Comment: 1 glass of wine a month   Drug use: No   Sexual activity: Not Currently    Birth control/protection: None  Other Topics Concern   Not on file  Social History Narrative   Lives alone, not compliant w/ med regimen   Social Determinants of Health   Financial Resource Strain: Not on file  Food Insecurity: Not on file  Transportation Needs: Not on file  Physical Activity: Not on file  Stress: Not on file  Social Connections: Not on file     Family History:  The patient's family history includes Breast cancer in her paternal grandmother; Hyperlipidemia in her mother; Hypertension in her mother, sister, and sister; Stroke in her mother. There is no history of Colon cancer, Ovarian cancer, Heart disease, or Diabetes.  ROS:   Review of Systems  Constitutional:  Positive for malaise/fatigue. Negative for chills, diaphoresis, fever and weight loss.  HENT:  Negative for congestion.   Eyes:  Negative for discharge and redness.  Respiratory:  Negative for cough, sputum production, shortness of breath and wheezing.   Cardiovascular:  Negative for chest pain, palpitations, orthopnea,  claudication, leg swelling and PND.  Gastrointestinal:  Negative for abdominal pain, blood in stool, heartburn, melena, nausea and vomiting.  Musculoskeletal:  Negative for falls and myalgias.  Skin:  Negative for rash.  Neurological:  Positive for dizziness, focal weakness and weakness. Negative for tingling, tremors, sensory change, speech change and loss  of consciousness.  Endo/Heme/Allergies:  Does not bruise/bleed easily.  Psychiatric/Behavioral:  Negative for substance abuse. The patient is not nervous/anxious.   All other systems reviewed and are negative.   EKGs/Labs/Other Studies Reviewed:    Studies reviewed were summarized above. The additional studies were reviewed today:  TEE 07/25/2021: 1. Left ventricular ejection fraction, by estimation, is 60 to 65%. The  left ventricle has normal function. The left ventricle has no regional  wall motion abnormalities.   2. Right ventricular systolic function is normal. The right ventricular  size is normal.   3. No left atrial/left atrial appendage thrombus was detected.   4. The mitral valve is normal in structure. Trivial mitral valve  regurgitation. No evidence of mitral stenosis.   5. The aortic valve is normal in structure. Aortic valve regurgitation is  mild to moderate. No aortic stenosis is present.   6. There is mild (Grade II) atheroma plaque.   7. The inferior vena cava is normal in size with greater than 50%  respiratory variability, suggesting right atrial pressure of 3 mmHg.   8. Agitated saline contrast bubble study was negative, with no evidence  of any interatrial shunt.   Conclusion(s)/Recommendation(s): Normal biventricular function without  evidence of hemodynamically significant valvular heart disease.  __________  Elwyn Reach patch 06/2021: Patch Wear Time:  14 days and 0 hours (2022-08-24T09:32:15-0400 to 2022-09-07T09:32:15-0400)   Normal sinus rhythm Patient had a min HR of 43 bpm, max HR of 141 bpm, and avg HR  of 59 bpm.    1 run of Ventricular Tachycardia occurred lasting 4 beats with a max rate of 141 bpm (avg 118 bpm). Isolated SVEs were rare (<1.0%),  SVE Triplets were rare (<1.0%), and no SVE Couplets were present.   Isolated VEs were rare (<1.0%), and no VE Couplets or VE Triplets were present.    Patient triggered event associated with normal sinus __________  2D echo 07/01/2021: 1. Left ventricular ejection fraction, by estimation, is 60 to 65%. The  left ventricle has normal function. The left ventricle has no regional  wall motion abnormalities. There is moderate left ventricular hypertrophy.  Left ventricular diastolic  parameters are consistent with Grade I diastolic dysfunction (impaired  relaxation).   2. Right ventricular systolic function is normal. The right ventricular  size is normal. Tricuspid regurgitation signal is inadequate for assessing  PA pressure.   3. The mitral valve was not well visualized. Trivial mitral valve  regurgitation. No evidence of mitral stenosis.   4. The aortic valve is tricuspid. Aortic valve regurgitation is mild to  moderate.  __________  Carotid artery ultrasound 04/10/2021: IMPRESSION: Bilateral carotid atherosclerosis. No hemodynamically significant ICA stenosis. Degree of narrowing less than 50% bilaterally by ultrasound criteria.   Patent antegrade vertebral flow bilaterally __________  2D echo 04/10/2021: 1. Left ventricular ejection fraction, by estimation, is 55 to 60%. The  left ventricle has normal function. The left ventricle has no regional  wall motion abnormalities. Left ventricular diastolic parameters are  consistent with Grade I diastolic  dysfunction (impaired relaxation). The global longitudinal strain is  normal.   2. Right ventricular systolic function is normal. The right ventricular  size is normal. Tricuspid regurgitation signal is inadequate for assessing  PA pressure.   3. Trivial pericardial effusion is  present. The pericardial effusion is  circumferential.   4. The mitral valve is normal in structure. No evidence of mitral valve  regurgitation. No evidence of mitral stenosis.   5. The aortic valve  is normal in structure. Aortic valve regurgitation is  mild. Mild aortic valve sclerosis is present, with no evidence of aortic  valve stenosis.   6. The inferior vena cava is normal in size with greater than 50%  respiratory variability, suggesting right atrial pressure of 3 mmHg. __________  [facility] 08/19/2016: Coronary angiography:  Coronary dominance: Right  Left mainstem:   Large vessel that bifurcates into the LAD and left circumflex, no significant disease noted  Left anterior descending (LAD):   Large vessel that extends to the apical region, diagonal branch 2 of small to moderate size,  30% proximal LAD disease, 40% mid LAD disease.  70% ostial and proximal disease of the D2 and D3 vessel  Left circumflex (LCx):  Large vessel with OM branch 2, no significant disease noted. Small vessel  Right coronary artery (RCA):  Right dominant vessel with PL and PDA, 40 to 50% ostial disease, 30% proximal disease.   Left ventriculography: Left ventricular systolic function is normal, LVEF is estimated at 55-65%, there is no significant mitral regurgitation , no significant aortic valve stenosis  Final Conclusions:   Stable mild to moderate LAD disease Moderate to severe ostial and proximal diagonal disease (unchanged or improved from previous study) Patent RCA disease Mild to Moderate ostial RCA disease, mild proximal RCA disease Normal EF  No intervention indicated at this time  Recommendations:  There was ostial RCA spasm, improved with IC NTG Review of previous cath from 2015 suggests some component of coronary spasm.  If she continues to have chest pain, could increase imdur. Would continue NTG as needed for chest pain Ranexa if pain becomes more frequent. __________  Stress echo  01/30/2016: - Stress ECG conclusions: There were no stress arrhythmias or    conduction abnormalities. The stress ECG was normal, although    target heart rate was not achieved.  - Staged echo: Left ventricular ejection fraction was normal at    rest and with stress. EF 55% at rest, improving to 65 to 70% with    stress. Normal echo stress, although target heart rate    suboptimal.   Impressions:   - Normal study after maximal exercise, although target heart rate    was not achieved.  __________  2D echo 02/19/2015: - Left ventricle: The cavity size was normal. Wall thickness was    normal. Systolic function was normal. The estimated ejection    fraction was in the range of 55% to 60%. Wall motion was normal;    there were no regional wall motion abnormalities. Doppler    parameters are consistent with abnormal left ventricular    relaxation (grade 1 diastolic dysfunction).  - Aortic valve: There was mild regurgitation.  - Mitral valve: Calcified annulus.  - Atrial septum: There was increased thickness of the septum,    consistent with lipomatous hypertrophy.   Impressions:   - Normal LV function; grade 1 diastolic dysfunction; mild AI.  __________  Carotid artery ultrasound 02/18/2015: Summary:  Bilateral: intimal wall thickening CCA. Mild calcific plaque origin  ICA. 1-39% ICA stenosis. Vertebral artery flow is antegrade.  __________  For remaining study history, please review chart.    EKG:  EKG is ordered today.  The EKG ordered today demonstrates sinus bradycardia, 56 bpm, anterolateral T wave inversion which has previously been noted  Recent Labs: 07/24/2021: ALT 16 07/25/2021: Hemoglobin 11.4; Magnesium 2.1; Platelets 272 07/26/2021: BUN 18; Creatinine, Ser 0.85; Potassium 3.6; Sodium 141; TSH 1.618  Recent Lipid Panel  Component Value Date/Time   CHOL 147 07/01/2021 0636   CHOL 192 05/15/2014 0500   TRIG 207 (H) 07/01/2021 0636   TRIG 155 05/15/2014 0500    HDL 32 (L) 07/01/2021 0636   HDL 51 05/15/2014 0500   CHOLHDL 4.6 07/01/2021 0636   VLDL 41 (H) 07/01/2021 0636   VLDL 31 05/15/2014 0500   LDLCALC 74 07/01/2021 0636   LDLCALC 110 (H) 05/15/2014 0500    PHYSICAL EXAM:    VS:  BP (!) 168/88 (BP Location: Left Arm, Patient Position: Sitting, Cuff Size: Normal)   Pulse (!) 56   Ht '5\' 2"'$  (1.575 m)   Wt 140 lb (63.5 kg)   SpO2 97%   BMI 25.61 kg/m   BMI: Body mass index is 25.61 kg/m.  Physical Exam Vitals reviewed.  Constitutional:      Appearance: She is well-developed.  HENT:     Head: Normocephalic and atraumatic.  Eyes:     General:        Right eye: No discharge.        Left eye: No discharge.  Neck:     Vascular: No JVD.  Cardiovascular:     Rate and Rhythm: Regular rhythm. Bradycardia present.     Pulses:          Posterior tibial pulses are 2+ on the right side and 2+ on the left side.     Heart sounds: Normal heart sounds, S1 normal and S2 normal. Heart sounds not distant. No midsystolic click and no opening snap. No murmur heard.   No friction rub.  Pulmonary:     Effort: Pulmonary effort is normal. No respiratory distress.     Breath sounds: Normal breath sounds. No decreased breath sounds, wheezing or rales.  Chest:     Chest wall: No tenderness.  Abdominal:     General: There is no distension.     Palpations: Abdomen is soft.     Tenderness: There is no abdominal tenderness.  Musculoskeletal:     Cervical back: Normal range of motion.     Right lower leg: No edema.     Left lower leg: No edema.     Comments: 5/5 right upper and lower extremity strength 4/5 left upper and lower extremity strength  Skin:    General: Skin is warm and dry.     Nails: There is no clubbing.  Neurological:     Mental Status: She is alert and oriented to person, place, and time.  Psychiatric:        Speech: Speech normal.        Behavior: Behavior normal.        Thought Content: Thought content normal.        Judgment:  Judgment normal.    Wt Readings from Last 3 Encounters:  07/29/21 140 lb (63.5 kg)  07/25/21 132 lb (59.9 kg)  07/01/21 137 lb 1.6 oz (62.2 kg)     ASSESSMENT & PLAN:   CAD involving the native coronary arteries without angina: She is doing well without any symptoms concerning for angina.  Continue secondary prevention and current medical therapy including aspirin and clopidogrel (in the setting of recurrent CVA), Toprol-XL, and rosuvastatin.  No indication for further ischemic testing at this time.  HFrEF secondary to ICM with subsequent normalization of EF: She appears euvolemic and well compensated.  She remains on Toprol-XL with mild asymptomatic sinus bradycardia.  Recurrent CVA's: She continues to note residual left-sided weakness.  In total,  she has had 4 CVAs, 3 ischemic, and 1 hemorrhagic as outlined above.  Prior outpatient cardiac monitoring in 06/2021 showed no evidence of A. fib/flutter.  Repeat ZIO AT.  Refer to EP for ILR implantation.  TEE was unrevealing as outlined above.  She remains on aspirin and clopidogrel per neurology.  Continue statin as outlined below.  Continue to work with PT.  History of ICH: Follow up with PCP and neurology as directed.  HTN: Blood pressure is mildly elevated at triage.  Given time duration since last CVA, we will reinstate low-dose amlodipine 5 mg daily.  She will otherwise continue furosemide and Toprol-XL.  HLD: LDL 74 in 06/2021 with goal < 60 given recurrent CVA.  She remains on Crestor 20 mg daily.  In follow-up, recommend rechecking fasting lipid panel and LFT with recommendation to escalate lipid therapy as indicated to achieve target LDL.  Disposition: F/u with Dr. Rockey Situ or an APP in 2 months.   Medication Adjustments/Labs and Tests Ordered: Current medicines are reviewed at length with the patient today.  Concerns regarding medicines are outlined above. Medication changes, Labs and Tests ordered today are summarized above and listed  in the Patient Instructions accessible in Encounters.   Signed, Christell Faith, PA-C 07/29/2021 12:30 PM     [place] [address] [place], [place] [address] [phone]

## 2021-07-29 ENCOUNTER — Encounter: Payer: Self-pay | Admitting: Physician Assistant

## 2021-07-29 ENCOUNTER — Ambulatory Visit (INDEPENDENT_AMBULATORY_CARE_PROVIDER_SITE_OTHER): Payer: PPO

## 2021-07-29 ENCOUNTER — Other Ambulatory Visit: Payer: Self-pay

## 2021-07-29 ENCOUNTER — Telehealth: Payer: Self-pay | Admitting: Physician Assistant

## 2021-07-29 ENCOUNTER — Ambulatory Visit (INDEPENDENT_AMBULATORY_CARE_PROVIDER_SITE_OTHER): Payer: PPO | Admitting: Physician Assistant

## 2021-07-29 VITALS — BP 168/88 | HR 56 | Ht 62.0 in | Wt 140.0 lb

## 2021-07-29 DIAGNOSIS — I639 Cerebral infarction, unspecified: Secondary | ICD-10-CM

## 2021-07-29 DIAGNOSIS — I619 Nontraumatic intracerebral hemorrhage, unspecified: Secondary | ICD-10-CM

## 2021-07-29 DIAGNOSIS — I2510 Atherosclerotic heart disease of native coronary artery without angina pectoris: Secondary | ICD-10-CM

## 2021-07-29 DIAGNOSIS — I255 Ischemic cardiomyopathy: Secondary | ICD-10-CM

## 2021-07-29 DIAGNOSIS — I5022 Chronic systolic (congestive) heart failure: Secondary | ICD-10-CM | POA: Diagnosis not present

## 2021-07-29 DIAGNOSIS — I10 Essential (primary) hypertension: Secondary | ICD-10-CM | POA: Diagnosis not present

## 2021-07-29 DIAGNOSIS — E785 Hyperlipidemia, unspecified: Secondary | ICD-10-CM | POA: Diagnosis not present

## 2021-07-29 MED ORDER — AMLODIPINE BESYLATE 5 MG PO TABS: 5.0000 mg | ORAL_TABLET | Freq: Every day | ORAL | 3 refills | Status: DC

## 2021-07-29 NOTE — Telephone Encounter (Addendum)
Patient was in today to see provider. He requested ASAP appointment to see Dr. Quentin Ore.Scheduled for appointment tomorrow with Dr. Quentin Ore in the office. Provider would like to have her rescheduled to wait on monitor results then see her in the office for recommendations. We did not know how fast that appointment would be scheduled so today we placed AT monitor to wear until she could see EP provider. Per secure chat we need to reschedule her for 2-3 weeks with EP provider.     Office visit 07/29/2021 with [name] Faith PA-C  Recurrent CVA's: She continues to note residual left-sided weakness.  In total, she has had 4 CVAs, 3 ischemic, and 1 hemorrhagic as outlined above.  Prior outpatient cardiac monitoring in 06/2021 showed no evidence of A. fib/flutter.  Repeat ZIO AT.  Refer to EP for ILR implantation.  TEE was unrevealing as outlined above.  She remains on aspirin and clopidogrel per neurology.  Continue statin as outlined below.  Continue to work with PT.

## 2021-07-29 NOTE — Patient Instructions (Addendum)
Medication Instructions:  Your physician has recommended you make the following change in your medication:   START Amlodipine 5 mg once daily   *If you need a refill on your cardiac medications before your next appointment, please call your pharmacy*   Lab Work: None  If you have labs (blood work) drawn today and your tests are completely normal, you will receive your results only by: [place] (if you have MyChart) OR A paper copy in the mail If you have any lab test that is abnormal or we need to change your treatment, we will call you to review the results.   Testing/Procedures: Your physician has recommended that you wear a Zio AT Live monitor for 2 weeks then another one for 2 weeks when you complete the first one. Remove the first monitor on August 12, 2021 and send in envelope provided with monitor. Prior to removing that monitor please call for appointment to have second monitor placed. If for some reason this monitor should fall off then call us so that we can get another monitor placed. Do NOT remove monitor for any reason. After 2 weeks we will have another one placed if you have not yet seen EP provider.    This monitor is a medical device that records the heart's electrical activity. Doctors most often use these monitors to diagnose arrhythmias. Arrhythmias are problems with the speed or rhythm of the heartbeat. The monitor is a small device applied to your chest. You can wear one while you do your normal daily activities. While wearing this monitor if you have any symptoms to push the button and record what you felt. Once you have worn this monitor for the period of time provider prescribed (Usually 14 days), you will return the monitor device in the postage paid box/bag. Once it is returned they will download the data collected and provide [place] with a report which the provider will then review and we will call you with those results.   Important tips:  Avoid showering  during the first 24 hours of wearing the monitor. Avoid excessive sweating to help maximize wear time. Do not submerge the device, no hot tubs, and no swimming pools. Keep any lotions or oils away from the patch. After 24 hours you may shower with the patch on. Take brief showers with your back facing the shower head.  Do not remove patch once it has been placed because that will interrupt data and decrease adhesive wear time. Push the button when you have any symptoms and write down what you were feeling. Once you have completed wearing your monitor, remove and place into box which has postage paid and place in your outgoing mailbox.  If for some reason you have misplaced your box then call our office and we can provide another box and/or mail it off for you. Keep the transmitter within 10 feet at all times.  Expect a welcome phone call within 53-61 hrs of application from [place].  This call will include your copay information, so please answer any unknown phone calls while wearing Zio (it could also be important information about your heart) The envelope to return Zio is in the back of the transmitter. Removal instructions are on the last page of the symptom diary.  Place the patch sticky side up inside the transmitter and the symptom diary inside the envelope to return on your last wear day inside your mailbox or any USPS mailbox.    Follow-Up: At [facility], you  and your health needs are our priority.  As part of our continuing mission to provide you with exceptional heart care, we have created designated Provider Care Teams.  These Care Teams include your primary Cardiologist (physician) and Advanced Practice Providers (APPs -  Physician Assistants and Nurse Practitioners) who all work together to provide you with the care you need, when you need it.  Your next appointment:   2 month(s)  The format for your next appointment:   In Person  Provider:   You may see Ida Rogue, MD or  one of the following Advanced Practice Providers on your designated Care Team:   Murray Hodgkins, NP Christell Faith, PA-C Marrianne Mood, PA-C Cadence Kathlen Mody, [place]   Other Instructions Referral placed today for appointment with EP provider. We will try to get this appointment ASAP for discussion of Loop device. If appointment is prior to second monitor then you might not need to wear the second one.

## 2021-07-30 ENCOUNTER — Institutional Professional Consult (permissible substitution): Payer: PPO | Admitting: Cardiology

## 2021-07-30 DIAGNOSIS — I639 Cerebral infarction, unspecified: Secondary | ICD-10-CM | POA: Diagnosis not present

## 2021-07-30 DIAGNOSIS — I619 Nontraumatic intracerebral hemorrhage, unspecified: Secondary | ICD-10-CM | POA: Diagnosis not present

## 2021-07-30 NOTE — Telephone Encounter (Signed)
Multiple attempts to reach to reschedule. Lmov and also attempted to reach emergency contact on dpr

## 2021-08-04 ENCOUNTER — Telehealth: Payer: Self-pay | Admitting: *Deleted

## 2021-08-04 NOTE — Telephone Encounter (Signed)
-----   Message from Solmon Ice, RN sent at 07/28/2021  2:32 PM EDT -----  ----- Message ----- From: Rise Mu, PA-C Sent: 07/23/2021   1:55 PM EDT To: Rebeca Alert [facility] Triage  Heart monitor showed a predominant rhythm of sinus with an average rate of 59 bpm.  1 run of NSVT (fast heartbeats coming from the bottom portion of the heart), occurred lasting just 4 beats.  Rare extra beats from the top and bottom portions of the heart.  No significant sustained arrhythmias noted.  Patient triggered events corresponded with normal sinus rhythm.

## 2021-08-04 NOTE — Telephone Encounter (Signed)
Left voicemail message to call back to review results.  

## 2021-08-06 DIAGNOSIS — S42202D Unspecified fracture of upper end of left humerus, subsequent encounter for fracture with routine healing: Secondary | ICD-10-CM | POA: Diagnosis not present

## 2021-08-06 DIAGNOSIS — N189 Chronic kidney disease, unspecified: Secondary | ICD-10-CM | POA: Diagnosis not present

## 2021-08-06 DIAGNOSIS — I130 Hypertensive heart and chronic kidney disease with heart failure and stage 1 through stage 4 chronic kidney disease, or unspecified chronic kidney disease: Secondary | ICD-10-CM | POA: Diagnosis not present

## 2021-08-06 DIAGNOSIS — I69254 Hemiplegia and hemiparesis following other nontraumatic intracranial hemorrhage affecting left non-dominant side: Secondary | ICD-10-CM | POA: Diagnosis not present

## 2021-08-06 DIAGNOSIS — I69222 Dysarthria following other nontraumatic intracranial hemorrhage: Secondary | ICD-10-CM | POA: Diagnosis not present

## 2021-08-06 DIAGNOSIS — I5022 Chronic systolic (congestive) heart failure: Secondary | ICD-10-CM | POA: Diagnosis not present

## 2021-08-06 DIAGNOSIS — M1712 Unilateral primary osteoarthritis, left knee: Secondary | ICD-10-CM | POA: Diagnosis not present

## 2021-08-06 NOTE — Telephone Encounter (Signed)
Left voicemail message to call back for review of her results.  

## 2021-08-08 ENCOUNTER — Telehealth: Payer: Self-pay | Admitting: Cardiology

## 2021-08-08 NOTE — Telephone Encounter (Signed)
-----   Message from Horton Finer sent at 08/08/2021  9:19 AM EDT ----- Regarding: ZIO Patient has appt scheduled on 10/5 but ZIO monitor just ended today and has not been returned to ZIO as of yet. It looks like you may already have a note in to reschedule but if that is an old note I just wanted to make you aware so you could move her appt back. Thank you so much!

## 2021-08-08 NOTE — Telephone Encounter (Signed)
Lmov to reschedule

## 2021-08-11 ENCOUNTER — Encounter: Payer: Self-pay | Admitting: *Deleted

## 2021-08-11 NOTE — Telephone Encounter (Signed)
Left voicemail message to call back for review of results.  This is call X3 Will mail letter to address on file with results and instructions to call if any further questions.

## 2021-08-12 DIAGNOSIS — I639 Cerebral infarction, unspecified: Secondary | ICD-10-CM | POA: Diagnosis not present

## 2021-08-12 DIAGNOSIS — E7800 Pure hypercholesterolemia, unspecified: Secondary | ICD-10-CM | POA: Diagnosis not present

## 2021-08-12 NOTE — Progress Notes (Deleted)
Electrophysiology Office Note:    Date:  08/12/2021   ID:  Azjah, Pardo 1956/05/22, MRN [id number]  PCP:  Baxter Hire, MD  [facility] Cardiologist:  Ida Rogue, MD  [place] Electrophysiologist:  None   Referring MD: Rise Mu, PA-C   Chief Complaint: cryptogenic stroke  History of Present Illness:    [name] is a 65 y.o. female who presents for an evaluation of cryptogenic stroke at the request of Christell Faith, PA-C. Their medical history includes CAD, HTN, HLD and syncope. The patient was last seen by Thurmond Butts on 07/29/2021.   The patient has recurrent CVA. Prior monitoring in August 2022 showed no evidence of AF.   Past Medical History:  Diagnosis Date   Anxiety    Arthritis    CAD (coronary artery disease)    a. 03/2013 NSTEMI/Cath: 100 RCA, EF 45%->Med Rx;  b. 12/2013 Cath/PCI: LAD 52m, 30d, D1 80, D2 60, LCX nl, RCA 50ost/p, 87m, 95d (2.5x33 Xience DES);  c. 05/2014 Lexi MV: EF 68%, no ischemia; d. stress echo 12/2015 no ischemia @ max exercise (did not achieve target)   Carotid disease, bilateral ([place])    a. 08/2013 Carotid U/S: 1-39% bilat ICA stenosis.   Chronic kidney disease    Chronic kidney infections. Takes daily preventative.   Complication of anesthesia    CVA (cerebral vascular accident) ([place])    a. 08/2013.   CVA (cerebral vascular accident) ([place])    Decreased libido    Depression    GERD (gastroesophageal reflux disease)    History of 2019 novel coronavirus disease (COVID-19) 07/2019   History of recurrent UTIs    Hypercholesteremia    Hyperlipemia    Hypertension    Insomnia    Ischemic cardiomyopathy    a. 03/2013 Echo: EF 30-35%, mod dil LA, mod-sev MR, mod TR;  b. 08/2013 Echo EF 60-65%, mild AI.   Menopause    Migraines    Myocardial infarction (HCC)    PONV (postoperative nausea and vomiting)    If anesthsia is given slowly, pt will not throw up, if given quickly, pt will have nausea and vomiting.   Right lower quadrant  pain    Syncope and collapse    Tobacco abuse    Vaginal atrophy     Past Surgical History:  Procedure Laterality Date   CARDIAC CATHETERIZATION  01/01/2014   CARDIAC CATHETERIZATION  03/16/2013   CARDIAC CATHETERIZATION  12/2013   CARDIAC CATHETERIZATION N/A 08/19/2016   Procedure: Left Heart Cath and Coronary Angiography;  Surgeon: Minna Merritts, MD;  Location: [facility];  Service: Cardiovascular;  Laterality: N/A;   CESAREAN SECTION WITH BILATERAL TUBAL LIGATION     CORONARY ANGIOPLASTY WITH STENT PLACEMENT  01/01/2014   95% lesion with a drug eluting stent to the distal RCA.   ENDOMETRIAL ABLATION     KNEE ARTHROSCOPY  11/27/2006   left knee    OOPHORECTOMY     TEE WITHOUT CARDIOVERSION N/A 07/25/2021   Procedure: TRANSESOPHAGEAL ECHOCARDIOGRAM (TEE);  Surgeon: Minna Merritts, MD;  Location: [facility];  Service: Cardiovascular;  Laterality: N/A;   TOTAL KNEE ARTHROPLASTY Left 10/06/2016   Procedure: TOTAL KNEE ARTHROPLASTY;  Surgeon: Corky Mull, MD;  Location: [facility];  Service: Orthopedics;  Laterality: Left;   TOTAL KNEE ARTHROPLASTY Right 11/12/2020   Procedure: TOTAL KNEE ARTHROPLASTY;  Surgeon: Corky Mull, MD;  Location: [facility];  Service: Orthopedics;  Laterality: Right;  TUBAL LIGATION      Current Medications: No outpatient medications have been marked as taking for the 08/13/21 encounter (Appointment) with Vickie Epley, MD.     Allergies:   Codeine sulfate and Penicillins   Social History   Socioeconomic History   Marital status: Single    Spouse name: Not on file   Number of children: 1   Years of education: Not on file   Highest education level: Not on file  Occupational History   Occupation: lab tech    Employer: [employer]  Tobacco Use   Smoking status: Former    Packs/day: 1.50    Years: 20.00    Pack years: 30.00    Types: Cigarettes    Quit date: 04/07/2013    Years since quitting: 8.3   Smokeless  tobacco: Never  Vaping Use   Vaping Use: Never used  Substance and Sexual Activity   Alcohol use: Not Currently    Comment: 1 glass of wine a month   Drug use: No   Sexual activity: Not Currently    Birth control/protection: None  Other Topics Concern   Not on file  Social History Narrative   Lives alone, not compliant w/ med regimen   Social Determinants of Health   Financial Resource Strain: Not on file  Food Insecurity: Not on file  Transportation Needs: Not on file  Physical Activity: Not on file  Stress: Not on file  Social Connections: Not on file     Family History: The patient's family history includes Breast cancer in her paternal grandmother; Hyperlipidemia in her mother; Hypertension in her mother, sister, and sister; Stroke in her mother. There is no history of Colon cancer, Ovarian cancer, Heart disease, or Diabetes.  ROS:   Please see the history of present illness.    All other systems reviewed and are negative.  EKGs/Labs/Other Studies Reviewed:    The following studies were reviewed today:  07/25/2021 TEE  1. Left ventricular ejection fraction, by estimation, is 60 to 65%. The  left ventricle has normal function. The left ventricle has no regional  wall motion abnormalities.   2. Right ventricular systolic function is normal. The right ventricular  size is normal.   3. No left atrial/left atrial appendage thrombus was detected.   4. The mitral valve is normal in structure. Trivial mitral valve  regurgitation. No evidence of mitral stenosis.   5. The aortic valve is normal in structure. Aortic valve regurgitation is  mild to moderate. No aortic stenosis is present.   6. There is mild (Grade II) atheroma plaque.   7. The inferior vena cava is normal in size with greater than 50%  respiratory variability, suggesting right atrial pressure of 3 mmHg.   8. Agitated saline contrast bubble study was negative, with no evidence  of any interatrial shunt.     07/22/2021 Zio personally reviewed No AF/AFL    EKG:  The ekg ordered today demonstrates ***  Recent Labs: 07/24/2021: ALT 16 07/25/2021: Hemoglobin 11.4; Magnesium 2.1; Platelets 272 07/26/2021: BUN 18; Creatinine, Ser 0.85; Potassium 3.6; Sodium 141; TSH 1.618  Recent Lipid Panel    Component Value Date/Time   CHOL 147 07/01/2021 0636   CHOL 192 05/15/2014 0500   TRIG 207 (H) 07/01/2021 0636   TRIG 155 05/15/2014 0500   HDL 32 (L) 07/01/2021 0636   HDL 51 05/15/2014 0500   CHOLHDL 4.6 07/01/2021 0636   VLDL 41 (H) 07/01/2021 0636   VLDL  31 05/15/2014 0500   LDLCALC 74 07/01/2021 0636   LDLCALC 110 (H) 05/15/2014 0500    Physical Exam:    VS:  There were no vitals taken for this visit.    Wt Readings from Last 3 Encounters:  07/29/21 140 lb (63.5 kg)  07/25/21 132 lb (59.9 kg)  07/01/21 137 lb 1.6 oz (62.2 kg)     GEN: *** Well nourished, well developed in no acute distress HEENT: Normal NECK: No JVD; No carotid bruits LYMPHATICS: No lymphadenopathy CARDIAC: ***RRR, no murmurs, rubs, gallops RESPIRATORY:  Clear to auscultation without rales, wheezing or rhonchi  ABDOMEN: Soft, non-tender, non-distended MUSCULOSKELETAL:  No edema; No deformity  SKIN: Warm and dry NEUROLOGIC:  Alert and oriented x 3 PSYCHIATRIC:  Normal affect   ASSESSMENT:    No diagnosis found. PLAN:    In order of problems listed above:   Results of monitor?  ILR     Total time spent with patient today *** minutes. This includes reviewing records, evaluating the patient and coordinating care.  Medication Adjustments/Labs and Tests Ordered: Current medicines are reviewed at length with the patient today.  Concerns regarding medicines are outlined above.  No orders of the defined types were placed in this encounter.  No orders of the defined types were placed in this encounter.    Signed, Hilton Cork. Quentin Ore, MD, [facility], [facility] 08/12/2021 8:39 PM    Electrophysiology [facility]

## 2021-08-13 ENCOUNTER — Institutional Professional Consult (permissible substitution): Payer: PPO | Admitting: Cardiology

## 2021-08-13 DIAGNOSIS — Z87440 Personal history of urinary (tract) infections: Secondary | ICD-10-CM | POA: Diagnosis not present

## 2021-08-13 DIAGNOSIS — Z79891 Long term (current) use of opiate analgesic: Secondary | ICD-10-CM | POA: Diagnosis not present

## 2021-08-13 DIAGNOSIS — F339 Major depressive disorder, recurrent, unspecified: Secondary | ICD-10-CM | POA: Diagnosis not present

## 2021-08-13 DIAGNOSIS — F5101 Primary insomnia: Secondary | ICD-10-CM | POA: Diagnosis not present

## 2021-08-13 DIAGNOSIS — F411 Generalized anxiety disorder: Secondary | ICD-10-CM | POA: Diagnosis not present

## 2021-08-13 DIAGNOSIS — I2510 Atherosclerotic heart disease of native coronary artery without angina pectoris: Secondary | ICD-10-CM | POA: Diagnosis not present

## 2021-08-13 DIAGNOSIS — E559 Vitamin D deficiency, unspecified: Secondary | ICD-10-CM | POA: Diagnosis not present

## 2021-08-13 DIAGNOSIS — K219 Gastro-esophageal reflux disease without esophagitis: Secondary | ICD-10-CM | POA: Diagnosis not present

## 2021-08-13 DIAGNOSIS — G40909 Epilepsy, unspecified, not intractable, without status epilepticus: Secondary | ICD-10-CM | POA: Diagnosis not present

## 2021-08-13 DIAGNOSIS — I739 Peripheral vascular disease, unspecified: Secondary | ICD-10-CM | POA: Diagnosis not present

## 2021-08-13 DIAGNOSIS — I252 Old myocardial infarction: Secondary | ICD-10-CM | POA: Diagnosis not present

## 2021-08-13 DIAGNOSIS — I5022 Chronic systolic (congestive) heart failure: Secondary | ICD-10-CM | POA: Diagnosis not present

## 2021-08-13 DIAGNOSIS — Z9181 History of falling: Secondary | ICD-10-CM | POA: Diagnosis not present

## 2021-08-13 DIAGNOSIS — N189 Chronic kidney disease, unspecified: Secondary | ICD-10-CM | POA: Diagnosis not present

## 2021-08-13 DIAGNOSIS — I69254 Hemiplegia and hemiparesis following other nontraumatic intracranial hemorrhage affecting left non-dominant side: Secondary | ICD-10-CM | POA: Diagnosis not present

## 2021-08-13 DIAGNOSIS — Z7982 Long term (current) use of aspirin: Secondary | ICD-10-CM | POA: Diagnosis not present

## 2021-08-13 DIAGNOSIS — H409 Unspecified glaucoma: Secondary | ICD-10-CM | POA: Diagnosis not present

## 2021-08-13 DIAGNOSIS — E7800 Pure hypercholesterolemia, unspecified: Secondary | ICD-10-CM | POA: Diagnosis not present

## 2021-08-13 DIAGNOSIS — I130 Hypertensive heart and chronic kidney disease with heart failure and stage 1 through stage 4 chronic kidney disease, or unspecified chronic kidney disease: Secondary | ICD-10-CM | POA: Diagnosis not present

## 2021-08-13 DIAGNOSIS — S42202D Unspecified fracture of upper end of left humerus, subsequent encounter for fracture with routine healing: Secondary | ICD-10-CM | POA: Diagnosis not present

## 2021-08-13 DIAGNOSIS — F17210 Nicotine dependence, cigarettes, uncomplicated: Secondary | ICD-10-CM | POA: Diagnosis not present

## 2021-08-13 DIAGNOSIS — Z8616 Personal history of COVID-19: Secondary | ICD-10-CM | POA: Diagnosis not present

## 2021-08-13 DIAGNOSIS — G4700 Insomnia, unspecified: Secondary | ICD-10-CM | POA: Diagnosis not present

## 2021-08-13 DIAGNOSIS — M1712 Unilateral primary osteoarthritis, left knee: Secondary | ICD-10-CM | POA: Diagnosis not present

## 2021-08-13 DIAGNOSIS — I69222 Dysarthria following other nontraumatic intracranial hemorrhage: Secondary | ICD-10-CM | POA: Diagnosis not present

## 2021-08-14 NOTE — Telephone Encounter (Signed)
Late entry:  Left Pt detailed message 08/13/2021 advising her potential loop implant date was rescheduled d/t heart monitor results still pending.  Advised to call back if any needs.

## 2021-08-19 DIAGNOSIS — F17210 Nicotine dependence, cigarettes, uncomplicated: Secondary | ICD-10-CM | POA: Diagnosis not present

## 2021-08-19 DIAGNOSIS — I2510 Atherosclerotic heart disease of native coronary artery without angina pectoris: Secondary | ICD-10-CM | POA: Diagnosis not present

## 2021-08-19 DIAGNOSIS — F32A Depression, unspecified: Secondary | ICD-10-CM | POA: Diagnosis not present

## 2021-08-19 DIAGNOSIS — Z1231 Encounter for screening mammogram for malignant neoplasm of breast: Secondary | ICD-10-CM | POA: Diagnosis not present

## 2021-08-19 DIAGNOSIS — I10 Essential (primary) hypertension: Secondary | ICD-10-CM | POA: Diagnosis not present

## 2021-08-19 DIAGNOSIS — Z720 Tobacco use: Secondary | ICD-10-CM | POA: Diagnosis not present

## 2021-08-19 DIAGNOSIS — E7800 Pure hypercholesterolemia, unspecified: Secondary | ICD-10-CM | POA: Diagnosis not present

## 2021-08-19 DIAGNOSIS — I5022 Chronic systolic (congestive) heart failure: Secondary | ICD-10-CM | POA: Diagnosis not present

## 2021-08-19 DIAGNOSIS — F411 Generalized anxiety disorder: Secondary | ICD-10-CM | POA: Diagnosis not present

## 2021-08-20 DIAGNOSIS — M7582 Other shoulder lesions, left shoulder: Secondary | ICD-10-CM | POA: Diagnosis not present

## 2021-08-20 DIAGNOSIS — S42255D Nondisplaced fracture of greater tuberosity of left humerus, subsequent encounter for fracture with routine healing: Secondary | ICD-10-CM | POA: Diagnosis not present

## 2021-08-22 ENCOUNTER — Other Ambulatory Visit: Payer: Self-pay | Admitting: Cardiovascular Disease

## 2021-08-22 DIAGNOSIS — F32A Depression, unspecified: Secondary | ICD-10-CM | POA: Diagnosis not present

## 2021-08-22 DIAGNOSIS — I5022 Chronic systolic (congestive) heart failure: Secondary | ICD-10-CM | POA: Diagnosis not present

## 2021-08-22 DIAGNOSIS — E7800 Pure hypercholesterolemia, unspecified: Secondary | ICD-10-CM | POA: Diagnosis not present

## 2021-08-22 DIAGNOSIS — I2510 Atherosclerotic heart disease of native coronary artery without angina pectoris: Secondary | ICD-10-CM | POA: Diagnosis not present

## 2021-08-22 DIAGNOSIS — R411 Anterograde amnesia: Secondary | ICD-10-CM | POA: Diagnosis not present

## 2021-08-22 DIAGNOSIS — I10 Essential (primary) hypertension: Secondary | ICD-10-CM | POA: Diagnosis not present

## 2021-08-22 NOTE — Telephone Encounter (Signed)
Furosemide was d/c at her last visit at the hospital in September. This is a PRN medication and not sure if this is something to be continued, please advise and send to pharmacy if appropriate.

## 2021-08-26 NOTE — Progress Notes (Signed)
Electrophysiology Office Note:    Date:  08/27/2021   ID:  [name], DOB 1956/04/30, MRN [id number]  PCP:  Baxter Hire, MD  [facility] Cardiologist:  Ida Rogue, MD  [facility] Electrophysiologist:  Vickie Epley, MD   Referring MD: Rise Mu, PA-C   Chief Complaint: Cryptogenic stroke  History of Present Illness:    [name] is a 65 y.o. female who presents for an evaluation of cryptogenic stroke at the request of Christell Faith, PA-C. Their medical history includes coronary artery disease, carotid disease, CKD, depression, hypertension, ischemic cardiomyopathy.  The patient was last seen by Christell Faith on July 29, 2021 in follow-up from a hospitalization.  She has had multiple strokes (October 2014, August 2022, September 2022).  She had intracranial hemorrhage in June 2022.  Telemetry during the most recent stroke episode showed no evidence of atrial fibrillation.  Ryan Dunn ordered the ZIO monitor which has been worn and showed no evidence of atrial fibrillation.     Past Medical History:  Diagnosis Date   Anxiety    Arthritis    CAD (coronary artery disease)    a. 03/2013 NSTEMI/Cath: 100 RCA, EF 45%->Med Rx;  b. 12/2013 Cath/PCI: LAD 60m, 30d, D1 80, D2 60, LCX nl, RCA 50ost/p, 60m, 95d (2.5x33 Xience DES);  c. 05/2014 Lexi MV: EF 68%, no ischemia; d. stress echo 12/2015 no ischemia @ max exercise (did not achieve target)   Carotid disease, bilateral ([place])    a. 08/2013 Carotid U/S: 1-39% bilat ICA stenosis.   Chronic kidney disease    Chronic kidney infections. Takes daily preventative.   Complication of anesthesia    CVA (cerebral vascular accident) ([place])    a. 08/2013.   CVA (cerebral vascular accident) ([place])    Decreased libido    Depression    GERD (gastroesophageal reflux disease)    History of 2019 novel coronavirus disease (COVID-19) 07/2019   History of recurrent UTIs    Hypercholesteremia    Hyperlipemia    Hypertension     Insomnia    Ischemic cardiomyopathy    a. 03/2013 Echo: EF 30-35%, mod dil LA, mod-sev MR, mod TR;  b. 08/2013 Echo EF 60-65%, mild AI.   Menopause    Migraines    Myocardial infarction (HCC)    PONV (postoperative nausea and vomiting)    If anesthsia is given slowly, pt will not throw up, if given quickly, pt will have nausea and vomiting.   Right lower quadrant pain    Syncope and collapse    Tobacco abuse    Vaginal atrophy     Past Surgical History:  Procedure Laterality Date   CARDIAC CATHETERIZATION  01/01/2014   CARDIAC CATHETERIZATION  03/16/2013   CARDIAC CATHETERIZATION  12/2013   CARDIAC CATHETERIZATION N/A 08/19/2016   Procedure: Left Heart Cath and Coronary Angiography;  Surgeon: Minna Merritts, MD;  Location: [place] [facility];  Service: Cardiovascular;  Laterality: N/A;   CESAREAN SECTION WITH BILATERAL TUBAL LIGATION     CORONARY ANGIOPLASTY WITH STENT PLACEMENT  01/01/2014   95% lesion with a drug eluting stent to the distal RCA.   ENDOMETRIAL ABLATION     KNEE ARTHROSCOPY  11/27/2006   left knee    OOPHORECTOMY     TEE WITHOUT CARDIOVERSION N/A 07/25/2021   Procedure: TRANSESOPHAGEAL ECHOCARDIOGRAM (TEE);  Surgeon: Minna Merritts, MD;  Location: [facility];  Service: Cardiovascular;  Laterality: N/A;   TOTAL KNEE ARTHROPLASTY Left  10/06/2016   Procedure: TOTAL KNEE ARTHROPLASTY;  Surgeon: Corky Mull, MD;  Location: [facility];  Service: Orthopedics;  Laterality: Left;   TOTAL KNEE ARTHROPLASTY Right 11/12/2020   Procedure: TOTAL KNEE ARTHROPLASTY;  Surgeon: Corky Mull, MD;  Location: [facility];  Service: Orthopedics;  Laterality: Right;   TUBAL LIGATION      Current Medications: Current Meds  Medication Sig   acetaminophen (TYLENOL) 500 MG tablet Take 500 mg by mouth every 6 (six) hours as needed for moderate pain or mild pain.   amLODipine (NORVASC) 5 MG tablet Take 1 tablet (5 mg total) by mouth daily.   aspirin EC 81 MG EC tablet Take 1 tablet (81 mg  total) by mouth daily. Swallow whole.   clopidogrel (PLAVIX) 75 MG tablet Take 1 tablet (75 mg total) by mouth daily.   furosemide (LASIX) 20 MG tablet Take 1 tablet (20 mg total) by mouth daily.   metoprolol succinate (TOPROL-XL) 50 MG 24 hr tablet Take 50 mg by mouth daily. Take with or immediately following a meal.   nitroGLYCERIN (NITROSTAT) 0.4 MG SL tablet Place 0.4 mg under the tongue every 5 (five) minutes as needed for chest pain.   pantoprazole (PROTONIX) 40 MG tablet Take 40 mg by mouth daily.   rosuvastatin (CRESTOR) 20 MG tablet Take 20 mg by mouth daily.     Allergies:   Codeine sulfate and Penicillins   Social History   Socioeconomic History   Marital status: Single    Spouse name: Not on file   Number of children: 1   Years of education: Not on file   Highest education level: Not on file  Occupational History   Occupation: lab tech    Employer: [employer]  Tobacco Use   Smoking status: Former    Packs/day: 1.50    Years: 20.00    Pack years: 30.00    Types: Cigarettes    Quit date: 04/07/2013    Years since quitting: 8.3   Smokeless tobacco: Never  Vaping Use   Vaping Use: Never used  Substance and Sexual Activity   Alcohol use: Not Currently    Comment: 1 glass of wine a month   Drug use: No   Sexual activity: Not Currently    Birth control/protection: None  Other Topics Concern   Not on file  Social History Narrative   Lives alone, not compliant w/ med regimen   Social Determinants of Health   Financial Resource Strain: Not on file  Food Insecurity: Not on file  Transportation Needs: Not on file  Physical Activity: Not on file  Stress: Not on file  Social Connections: Not on file     Family History: The patient's family history includes Breast cancer in her paternal grandmother; Hyperlipidemia in her mother; Hypertension in her mother, sister, and sister; Stroke in her mother. There is no history of Colon cancer, Ovarian cancer,  Heart disease, or Diabetes.  ROS:   Please see the history of present illness.    All other systems reviewed and are negative.  EKGs/Labs/Other Studies Reviewed:    The following studies were reviewed today:  August 19, 2021 ZIO monitor personally reviewed No evidence of atrial fibrillation   July 01, 2021 echo Left ventricular function normal, 60% Right ventricular function normal Trivial MR   Recent Labs: 07/24/2021: ALT 16 07/25/2021: Hemoglobin 11.4; Magnesium 2.1; Platelets 272 07/26/2021: BUN 18; Creatinine, Ser 0.85; Potassium 3.6; Sodium 141; TSH 1.618  Recent Lipid Panel  Component Value Date/Time   CHOL 147 07/01/2021 0636   CHOL 192 05/15/2014 0500   TRIG 207 (H) 07/01/2021 0636   TRIG 155 05/15/2014 0500   HDL 32 (L) 07/01/2021 0636   HDL 51 05/15/2014 0500   CHOLHDL 4.6 07/01/2021 0636   VLDL 41 (H) 07/01/2021 0636   VLDL 31 05/15/2014 0500   LDLCALC 74 07/01/2021 0636   LDLCALC 110 (H) 05/15/2014 0500    Physical Exam:    VS:  BP 132/80 (BP Location: Left Arm, Patient Position: Sitting, Cuff Size: Normal)   Pulse 68   Ht 5\' 2"  (1.575 m)   Wt 140 lb (63.5 kg)   SpO2 98%   BMI 25.61 kg/m     Wt Readings from Last 3 Encounters:  08/27/21 140 lb (63.5 kg)  07/29/21 140 lb (63.5 kg)  07/25/21 132 lb (59.9 kg)     GEN:  Well nourished, well developed in no acute distress HEENT: Normal NECK: No JVD; No carotid bruits LYMPHATICS: No lymphadenopathy CARDIAC: RRR, no murmurs, rubs, gallops RESPIRATORY:  Clear to auscultation without rales, wheezing or rhonchi  ABDOMEN: Soft, non-tender, non-distended MUSCULOSKELETAL:  No edema; No deformity  SKIN: Warm and dry NEUROLOGIC:  Alert and oriented x 3 PSYCHIATRIC:  Normal affect       ASSESSMENT:    1. Recurrent cerebrovascular accidents (CVAs) ([place])   2. Essential hypertension    PLAN:    In order of problems listed above:  #Cryptogenic stroke Recurrent stroke episodes.  No atrial  fibrillation has been detected thus far.  ZIO monitor without evidence of atrial fibrillation.  Discussed using a loop recorder for ongoing surveillance for atrial fibrillation the patient is interested in proceeding.  I discussed the procedure in detail including the risk.  #Hypertension Controlled   Medication Adjustments/Labs and Tests Ordered: Current medicines are reviewed at length with the patient today.  Concerns regarding medicines are outlined above.  No orders of the defined types were placed in this encounter.  No orders of the defined types were placed in this encounter.    Signed, Hilton Cork. Quentin Ore, MD, [facility], [facility] 08/27/2021 3:06 PM    Electrophysiology  [facility]    ----------------------------------------    SURGEON:  Lars Mage, MD    PREPROCEDURE DIAGNOSIS:  Cryptogenic stroke    POSTPROCEDURE DIAGNOSIS:  Cryptogenic stroke     PROCEDURES:   1. Implantable loop recorder implantation    INTRODUCTION:  [name] is a 65 y.o. patient with a history of cryptogenic stroke. Inpatient telemetry has been reviewed and not shown atrial fibrillation. The patient therefore presents today for implantable loop implantation.     DESCRIPTION OF PROCEDURE:  Informed written consent was obtained.  The patient required no sedation for the procedure today.  Mapping over the patient's chest was performed to identify the area where electrograms were most prominent for ILR recording.  This area was found to be the left parasternal region over the 4th intercostal space. The patients left chest was therefore prepped and draped in the usual sterile fashion. The skin overlying the left parasternal region was infiltrated with lidocaine for local analgesia.  A 0.5-cm incision was made over the left parasternal region over the 3rd intercostal space.  A subcutaneous ILR pocket was fashioned using a combination of sharp and blunt dissection.  A Medtronic  Reveal Linq model [id number]) implantable loop recorder was then placed into the pocket  R waves were very prominent and measured >0.37mV.  Steri- Strips and a sterile dressing were then applied.  There were no early apparent complications.     CONCLUSIONS:   1. Successful implantation of a Medtronic Reveal LINQ implantable loop recorder for a history of cryptogenic stroke  2. No early apparent complications.   Lars Mage, MD 01/06/2021 3:51 PM

## 2021-08-27 ENCOUNTER — Ambulatory Visit: Payer: PPO | Admitting: Cardiology

## 2021-08-27 ENCOUNTER — Encounter: Payer: Self-pay | Admitting: Cardiology

## 2021-08-27 ENCOUNTER — Other Ambulatory Visit: Payer: Self-pay

## 2021-08-27 VITALS — BP 132/80 | HR 68 | Ht 62.0 in | Wt 140.0 lb

## 2021-08-27 DIAGNOSIS — I639 Cerebral infarction, unspecified: Secondary | ICD-10-CM | POA: Diagnosis not present

## 2021-08-27 DIAGNOSIS — I10 Essential (primary) hypertension: Secondary | ICD-10-CM

## 2021-08-27 NOTE — Patient Instructions (Signed)
Medication Instructions:  Your physician recommends that you continue on your current medications as directed. Please refer to the Current Medication list given to you today.  Labwork: None ordered.  Testing/Procedures: None ordered.  Follow-Up:  Your physician wants you to follow-up in: as needed  Implantable Loop Recorder Placement, Care After This sheet gives you information about how to care for yourself after your procedure. Your health care provider may also give you more specific instructions. If you have problems or questions, contact your health care provider. What can I expect after the procedure? After the procedure, it is common to have: Soreness or discomfort near the incision. Some swelling or bruising near the incision.  Follow these instructions at home: Incision care   Leave your outer dressing on for 72 hours.  After 72 hours you can remove your outer dressing and shower. Leave adhesive strips in place. These skin closures may need to stay in place for 1-2 weeks. If adhesive strip edges start to loosen and curl up, you may trim the loose edges.  You may remove the strips if they have not fallen off after 2 weeks. Check your incision area every day for signs of infection. Check for: Redness, swelling, or pain. Fluid or blood. Warmth. Pus or a bad smell. Do not take baths, swim, or use a hot tub until your incision is completely healed. If your wound site starts to bleed apply pressure.      If you have any questions/concerns please call the device clinic at [phone].  Activity  Return to your normal activities.  General instructions Follow instructions from your health care provider about how to manage your implantable loop recorder and transmit the information. Learn how to activate a recording if this is necessary for your type of device. Take over-the-counter and prescription medicines only as told by your health care provider. Keep all follow-up  visits as told by your health care provider. This is important. Contact a health care provider if: You have redness, swelling, or pain around your incision. You have a fever. You have pain that is not relieved by your pain medicine. You have triggered your device because of fainting (syncope) or because of a heartbeat that feels like it is racing, slow, fluttering, or skipping (palpitations). Get help right away if you have: Chest pain. Difficulty breathing. Summary After the procedure, it is common to have soreness or discomfort near the incision. Change your dressing as told by your health care provider. Follow instructions from your health care provider about how to manage your implantable loop recorder and transmit the information. Keep all follow-up visits as told by your health care provider. This is important. This information is not intended to replace advice given to you by your health care provider. Make sure you discuss any questions you have with your health care provider. Document Released: 10/07/2015 Document Revised: 12/11/2017 Document Reviewed: 12/11/2017 Elsevier Patient Education  2020 [employer].

## 2021-08-28 ENCOUNTER — Telehealth: Payer: Self-pay | Admitting: Cardiology

## 2021-08-28 NOTE — Telephone Encounter (Signed)
Brandy NP called in to report continued elevated blood pressures with ranges in the 253'G systolic based on patients log of readings and their assessment today. Given that her blood pressures today were 150/90 and 158/86 she had patient take additional dose of Amlodipine 5 mg to see if that would help. She also reports continued dizziness but that this is ongoing from her last CVA and has not worsened. She does have upcoming appointment with neurology for that. Patient did have loop recorder placed yesterday here in the office.   Brandy NP is part of patients healthcare team with her health insurance. Reviewed chart and advised I would send over to provider for his review and further recommendations. She verbalized understanding with no further questions at this time.

## 2021-08-28 NOTE — Telephone Encounter (Signed)
Pt c/o BP issue: STAT if pt c/o blurred vision, one-sided weakness or slurred speech  1. What are your last 5 BP readings? 150/90  158/86 during home visit   2. Are you having any other symptoms (ex. Dizziness, headache, blurred vision, passed out)? On going dizziness since last cva   3. What is your BP issue? Home visit with pcp concerned about pressure.     Please call to discuss

## 2021-08-29 MED ORDER — AMLODIPINE BESYLATE 10 MG PO TABS: 10.0000 mg | ORAL_TABLET | Freq: Every day | ORAL | 3 refills | Status: AC

## 2021-08-29 NOTE — Telephone Encounter (Signed)
Called and spoke with Theadora Rama NP taking care of this patient. Reviewed recommendations to increase amlodipine to 10 mg once daily and to also follow up with neurology and primary care provider regarding the ongoing dizziness. Inquired if she would like me to call and update patient and she stated that she will update patient on these recommendations since she is going to follow up with her regarding her ongoing health care. She was appreciative for the call back and recommendations.

## 2021-08-29 NOTE — Telephone Encounter (Signed)
For HTN, increase amlodipine to 10 mg daily. For ongoing dizziness s/p CVA, follow up with PCP and neurology.

## 2021-09-09 ENCOUNTER — Other Ambulatory Visit: Payer: Self-pay | Admitting: Cardiovascular Disease

## 2021-09-09 ENCOUNTER — Other Ambulatory Visit: Payer: Self-pay | Admitting: Physician Assistant

## 2021-09-16 ENCOUNTER — Telehealth: Payer: Self-pay | Admitting: *Deleted

## 2021-09-16 NOTE — Telephone Encounter (Signed)
-----   Message from Rise Mu, PA-C sent at 09/16/2021 11:03 AM EST ----- Repeat outpatient cardiac monitoring demonstrated a predominant rhythm of sinus with an average rate of 62 bpm (range 45 to 145 bpm), 4 runs of SVT occurred with the longest episode lasting 4 beats, rare PACs, atrial couplets, atrial triplets, and PVCs were noted.  Patient triggered events were not associated with significant arrhythmia.  Patient is now monitored with a loop recorder by EP.  Follow-up as previously scheduled.

## 2021-09-16 NOTE — Telephone Encounter (Signed)
Left voicemail message to call back for review of results.  

## 2021-09-18 NOTE — Telephone Encounter (Signed)
Left voicemail message to call back for review of results.  

## 2021-09-19 ENCOUNTER — Encounter: Payer: Self-pay | Admitting: *Deleted

## 2021-09-19 DIAGNOSIS — Z8673 Personal history of transient ischemic attack (TIA), and cerebral infarction without residual deficits: Secondary | ICD-10-CM | POA: Diagnosis not present

## 2021-09-19 DIAGNOSIS — I10 Essential (primary) hypertension: Secondary | ICD-10-CM | POA: Diagnosis not present

## 2021-09-19 DIAGNOSIS — G4710 Hypersomnia, unspecified: Secondary | ICD-10-CM | POA: Diagnosis not present

## 2021-09-19 NOTE — Telephone Encounter (Signed)
Left voicemail message to call back for review of results.  

## 2021-09-22 ENCOUNTER — Encounter: Payer: Self-pay | Admitting: *Deleted

## 2021-09-22 NOTE — Telephone Encounter (Signed)
We have been unsuccessful with reaching patient to review her monitor results. Letter completed and placed in outgoing mail that we have result information and her upcoming appointment information.

## 2021-09-29 ENCOUNTER — Other Ambulatory Visit: Payer: Self-pay

## 2021-09-29 ENCOUNTER — Ambulatory Visit (INDEPENDENT_AMBULATORY_CARE_PROVIDER_SITE_OTHER): Payer: PPO | Admitting: Physician Assistant

## 2021-09-29 ENCOUNTER — Encounter: Payer: Self-pay | Admitting: Physician Assistant

## 2021-09-29 VITALS — BP 124/74 | HR 72 | Ht 61.5 in | Wt 147.0 lb

## 2021-09-29 DIAGNOSIS — E785 Hyperlipidemia, unspecified: Secondary | ICD-10-CM

## 2021-09-29 DIAGNOSIS — Z79899 Other long term (current) drug therapy: Secondary | ICD-10-CM

## 2021-09-29 DIAGNOSIS — I255 Ischemic cardiomyopathy: Secondary | ICD-10-CM

## 2021-09-29 DIAGNOSIS — I5022 Chronic systolic (congestive) heart failure: Secondary | ICD-10-CM

## 2021-09-29 DIAGNOSIS — I2510 Atherosclerotic heart disease of native coronary artery without angina pectoris: Secondary | ICD-10-CM

## 2021-09-29 DIAGNOSIS — I10 Essential (primary) hypertension: Secondary | ICD-10-CM

## 2021-09-29 DIAGNOSIS — I639 Cerebral infarction, unspecified: Secondary | ICD-10-CM | POA: Diagnosis not present

## 2021-09-29 MED ORDER — NITROGLYCERIN 0.4 MG SL SUBL: 0.4000 mg | SUBLINGUAL_TABLET | SUBLINGUAL | 1 refills | Status: DC | PRN

## 2021-09-29 NOTE — Progress Notes (Addendum)
Cardiology Office Note    Date:  09/29/2021   ID:  [name], [name] [birth year], MRN [id number]  PCP:  Baxter Hire, MD  Cardiologist:  Ida Rogue, MD  Electrophysiologist:  Vickie Epley, MD   Chief Complaint: Follow-up  History of Present Illness:   [name] is a 65 y.o. female with history of CAD with NSTEMI in 03/2013 medically managed with prior stenting to the distal RCA in 12/2013, HFrEF secondary to ICM with subsequent normalization of LVSF, recurrent CVAs (08/2013, 06/2021, 07/2021), [place] in 04/2021, COVID in 07/2019, HTN, HLD migraine disorder, tobacco use, depression, and GERD who presents for follow-up of CAD and cardiomyopathy.   She was admitted to the hospital in 03/2013 with an NSTEMI.  Cath demonstrated two-vessel CAD with an occluded mid RCA with collaterals from the distal LAD.  Medical management was advised.  Remaining cath details included 75% stenosis diagonal #1 near the ostium, 40% mid LAD, 30% distal LAD, 50% D2 disease, 30% proximal circumflex, 75% proximal RCA disease.  Repeat cath in 12/2013 showed 95% distal RCA stenosis status post PCI/DES as well as 40% mid LAD, 30% distal LAD, 80% D1 disease, 60% D2 disease, 50% ostial RCA disease, 50% proximal RCA disease, 60% mid RCA disease.  Most recent cath from 08/2016 showed left LAD 30% proximal stenosis, 40% mid LAD disease, 70% ostial and proximal D2 and D3 disease, RCA 40 to 50% ostial disease, 30% proximal RCA disease. LVSF normal.  Given stability of CAD, medical management was advised.   She was admitted to [facility] in 08/2013 with a CVA.  Surface echo at that time showed an EF of 60 to 65%, no regional wall motion abnormalities, and mild mitral regurgitation.  Carotid artery ultrasound showed bilateral 1 to 39% ICA stenosis with antegrade vertebral artery flow.   She was admitted in 04/2021 with right thalamic hemorrhagic CVA with right to left midline shift felt to be in the setting of hypertensive emergency  n.  Echo showed an EF of 55 to 60%, no regional wall motion normalities, grade 1 diastolic dysfunction, normal RV systolic function and ventricular cavity size, trivial circumferential pericardial effusion, mild aortic valve insufficiency, mild aortic valve sclerosis without evidence of stenosis, and an estimated right atrial pressure of 3 mmHg.  Carotid artery ultrasound showed less than 50% bilateral ICA stenosis.   She was admitted in 06/2021 with a small acute left cerebral infarct and a subacute to chronic right corona radiata lacunar infarct.  Echo showed an EF of 60 to 65%, no regional wall motion abnormalities, moderate LVH, grade 1 diastolic dysfunction, normal RV systolic function and ventricular cavity size, trivial mitral regurgitation, and mild to moderate aortic insufficiency.  Given recent [name] in 04/2021, neurology recommended single antiplatelet therapy.  Outpatient cardiac monitoring demonstrated a predominant rhythm of sinus with an average heart rate of 59 (range 43-1 41), 1 run of NSVT lasting 4 beats, rare PACs and PVCs, and no significant sustained arrhythmias.  Patient triggered events were associated with sinus rhythm.   Most recently, she was admitted in 07/2021 with small acute to subacute infarct in the left temporal lobe that was new compared to study in 06/2021.  No acute intracranial hemorrhage.  She was deemed to be outside of tPA window.  Given history of bilateral strokes, she underwent TEE which was negative for intracardiac shunt.  Neurology recommended discharging the patient on aspirin, Plavix, and statin.  At time of discharge, she noted persistent dizziness,  though this was improving.    She was seen in the office in 07/2021 and was doing well from a cardiac perspective.  She did continue to note dizziness which has been present since her third stroke earlier that summer.  BP was running in the 423N to 361W systolic.  Low-dose amlodipine was reinitiated.  She was referred to  EP and underwent loop recorder implantation on 08/27/2021.  Due to elevated blood pressure readings noted on phone note, amlodipine has subsequently been titrated to 10 mg daily.  She comes in doing well from a cardiac perspective.  No chest pain, dyspnea, palpitations, dizziness, presyncope, or syncope.  Since titrating amlodipine her blood pressure has been well controlled.  She indicates that she is taking her medications, though does not recall the names or specifics of these medications outside of she is no longer taking furosemide.  She does note some mild ankle edema that is typically worse throughout the day and improves with laying supine.  No falls, hematochezia, or melena.  Since we last saw her she has established with neurology and is scheduled for a sleep study.  Her dizziness has improved.  She is no longer working with PT with noted improved strength.  No issues from her loop recorder implantation.     Labs independently reviewed: 07/2021 - TSH normal, potassium 3.6, BUN 18, serum creatinine 0.85, magnesium 2.1, Hgb 11.4, PLT 272, albumin 3.6, AST/ALT normal 06/2021 - TC 147, TG 207, HDL 32, LDL 74, A1c 6.2    Past Medical History:  Diagnosis Date   Anxiety    Arthritis    CAD (coronary artery disease)    a. 03/2013 NSTEMI/Cath: 100 RCA, EF 45%->Med Rx;  b. 12/2013 Cath/PCI: LAD 74m, 30d, D1 80, D2 60, LCX nl, RCA 50ost/p, 23m, 95d (2.5x33 Xience DES);  c. 05/2014 Lexi MV: EF 68%, no ischemia; d. stress echo 12/2015 no ischemia @ max exercise (did not achieve target)   Carotid disease, bilateral ([place])    a. 08/2013 Carotid U/S: 1-39% bilat ICA stenosis.   Chronic kidney disease    Chronic kidney infections. Takes daily preventative.   Complication of anesthesia    CVA (cerebral vascular accident) ([place])    a. 08/2013.   CVA (cerebral vascular accident) ([place])    Decreased libido    Depression    GERD (gastroesophageal reflux disease)    History of 2019 novel coronavirus disease  (COVID-19) 07/2019   History of recurrent UTIs    Hypercholesteremia    Hyperlipemia    Hypertension    Insomnia    Ischemic cardiomyopathy    a. 03/2013 Echo: EF 30-35%, mod dil LA, mod-sev MR, mod TR;  b. 08/2013 Echo EF 60-65%, mild AI.   Menopause    Migraines    Myocardial infarction (HCC)    PONV (postoperative nausea and vomiting)    If anesthsia is given slowly, pt will not throw up, if given quickly, pt will have nausea and vomiting.   Right lower quadrant pain    Syncope and collapse    Tobacco abuse    Vaginal atrophy     Past Surgical History:  Procedure Laterality Date   CARDIAC CATHETERIZATION  01/01/2014   CARDIAC CATHETERIZATION  03/16/2013   CARDIAC CATHETERIZATION  12/2013   CARDIAC CATHETERIZATION N/A 08/19/2016   Procedure: Left Heart Cath and Coronary Angiography;  Surgeon: Minna Merritts, MD;  Location: [facility];  Service: Cardiovascular;  Laterality: N/A;   CESAREAN SECTION WITH  BILATERAL TUBAL LIGATION     CORONARY ANGIOPLASTY WITH STENT PLACEMENT  01/01/2014   95% lesion with a drug eluting stent to the distal RCA.   ENDOMETRIAL ABLATION     KNEE ARTHROSCOPY  11/27/2006   left knee    OOPHORECTOMY     TEE WITHOUT CARDIOVERSION N/A 07/25/2021   Procedure: TRANSESOPHAGEAL ECHOCARDIOGRAM (TEE);  Surgeon: Minna Merritts, MD;  Location: [facility];  Service: Cardiovascular;  Laterality: N/A;   TOTAL KNEE ARTHROPLASTY Left 10/06/2016   Procedure: TOTAL KNEE ARTHROPLASTY;  Surgeon: Corky Mull, MD;  Location: [facility];  Service: Orthopedics;  Laterality: Left;   TOTAL KNEE ARTHROPLASTY Right 11/12/2020   Procedure: TOTAL KNEE ARTHROPLASTY;  Surgeon: Corky Mull, MD;  Location: [facility];  Service: Orthopedics;  Laterality: Right;   TUBAL LIGATION      Current Medications: Current Meds  Medication Sig   acetaminophen (TYLENOL) 500 MG tablet Take 500 mg by mouth every 6 (six) hours as needed for moderate pain or mild pain.   amLODipine  (NORVASC) 10 MG tablet Take 1 tablet (10 mg total) by mouth daily.   aspirin EC 81 MG EC tablet Take 1 tablet (81 mg total) by mouth daily. Swallow whole.   nitroGLYCERIN (NITROSTAT) 0.4 MG SL tablet Place 0.4 mg under the tongue every 5 (five) minutes as needed for chest pain.   pantoprazole (PROTONIX) 40 MG tablet Take 40 mg by mouth daily.    Allergies:   Codeine sulfate and Penicillins   Social History   Socioeconomic History   Marital status: Single    Spouse name: Not on file   Number of children: 1   Years of education: Not on file   Highest education level: Not on file  Occupational History   Occupation: lab tech    Employer: [employer]  Tobacco Use   Smoking status: Former    Packs/day: 1.50    Years: 20.00    Pack years: 30.00    Types: Cigarettes    Quit date: 04/07/2013    Years since quitting: 8.4   Smokeless tobacco: Never  Vaping Use   Vaping Use: Never used  Substance and Sexual Activity   Alcohol use: Not Currently    Comment: 1 glass of wine a month   Drug use: No   Sexual activity: Not Currently    Birth control/protection: None  Other Topics Concern   Not on file  Social History Narrative   Lives alone, not compliant w/ med regimen   Social Determinants of Health   Financial Resource Strain: Not on file  Food Insecurity: Not on file  Transportation Needs: Not on file  Physical Activity: Not on file  Stress: Not on file  Social Connections: Not on file     Family History:  The patient's family history includes Breast cancer in her paternal grandmother; Hyperlipidemia in her mother; Hypertension in her mother, sister, and sister; Stroke in her mother. There is no history of Colon cancer, Ovarian cancer, Heart disease, or Diabetes.  ROS:   Review of Systems  Constitutional:  Positive for malaise/fatigue. Negative for chills, diaphoresis, fever and weight loss.  HENT:  Negative for congestion.   Eyes:  Negative for discharge and  redness.  Respiratory:  Negative for cough, sputum production, shortness of breath and wheezing.   Cardiovascular:  Positive for leg swelling. Negative for chest pain, palpitations, orthopnea, claudication and PND.  Gastrointestinal:  Negative for abdominal pain, blood in stool, heartburn, melena,  nausea and vomiting.  Musculoskeletal:  Negative for falls and myalgias.  Skin:  Negative for rash.  Neurological:  Negative for dizziness, tingling, tremors, sensory change, speech change, focal weakness, loss of consciousness and weakness.  Endo/Heme/Allergies:  Does not bruise/bleed easily.  Psychiatric/Behavioral:  Negative for substance abuse. The patient is not nervous/anxious.   All other systems reviewed and are negative.   EKGs/Labs/Other Studies Reviewed:    Studies reviewed were summarized above. The additional studies were reviewed today:  Zio patch 07/2021 Patient had a min HR of 45 bpm, max HR of 145 bpm, and avg HR of 62 bpm.  Predominant underlying rhythm was Sinus Rhythm.    4 Supraventricular Tachycardia runs occurred, the run with the fastest interval lasting 4 beats with a max rate of 145 bpm, the longest lasting 4 beats with an avg rate of 96 bpm.   Isolated SVEs were rare (<1.0%), SVE Couplets were rare (<1.0%), and SVE Triplets were rare (<1.0%).  Isolated VEs were rare (<1.0%), and no VE Couplets or VE Triplets were present.    Patient triggered event not associated with significant arrhythmia __________  TEE 07/25/2021: 1. Left ventricular ejection fraction, by estimation, is 60 to 65%. The  left ventricle has normal function. The left ventricle has no regional  wall motion abnormalities.   2. Right ventricular systolic function is normal. The right ventricular  size is normal.   3. No left atrial/left atrial appendage thrombus was detected.   4. The mitral valve is normal in structure. Trivial mitral valve  regurgitation. No evidence of mitral stenosis.   5. The  aortic valve is normal in structure. Aortic valve regurgitation is  mild to moderate. No aortic stenosis is present.   6. There is mild (Grade II) atheroma plaque.   7. The inferior vena cava is normal in size with greater than 50%  respiratory variability, suggesting right atrial pressure of 3 mmHg.   8. Agitated saline contrast bubble study was negative, with no evidence  of any interatrial shunt.   Conclusion(s)/Recommendation(s): Normal biventricular function without  evidence of hemodynamically significant valvular heart disease.  __________   Elwyn Reach patch 06/2021: Patch Wear Time:  14 days and 0 hours (2022-08-24T09:32:15-0400 to 2022-09-07T09:32:15-0400)   Normal sinus rhythm Patient had a min HR of 43 bpm, max HR of 141 bpm, and avg HR of 59 bpm.    1 run of Ventricular Tachycardia occurred lasting 4 beats with a max rate of 141 bpm (avg 118 bpm). Isolated SVEs were rare (<1.0%),  SVE Triplets were rare (<1.0%), and no SVE Couplets were present.   Isolated VEs were rare (<1.0%), and no VE Couplets or VE Triplets were present.    Patient triggered event associated with normal sinus __________   2D echo 07/01/2021: 1. Left ventricular ejection fraction, by estimation, is 60 to 65%. The  left ventricle has normal function. The left ventricle has no regional  wall motion abnormalities. There is moderate left ventricular hypertrophy.  Left ventricular diastolic  parameters are consistent with Grade I diastolic dysfunction (impaired  relaxation).   2. Right ventricular systolic function is normal. The right ventricular  size is normal. Tricuspid regurgitation signal is inadequate for assessing  PA pressure.   3. The mitral valve was not well visualized. Trivial mitral valve  regurgitation. No evidence of mitral stenosis.   4. The aortic valve is tricuspid. Aortic valve regurgitation is mild to  moderate.  __________   Carotid artery ultrasound 04/10/2021: IMPRESSION: Bilateral  carotid atherosclerosis. No hemodynamically significant ICA stenosis. Degree of narrowing less than 50% bilaterally by ultrasound criteria.   Patent antegrade vertebral flow bilaterally __________   2D echo 04/10/2021: 1. Left ventricular ejection fraction, by estimation, is 55 to 60%. The  left ventricle has normal function. The left ventricle has no regional  wall motion abnormalities. Left ventricular diastolic parameters are  consistent with Grade I diastolic  dysfunction (impaired relaxation). The global longitudinal strain is  normal.   2. Right ventricular systolic function is normal. The right ventricular  size is normal. Tricuspid regurgitation signal is inadequate for assessing  PA pressure.   3. Trivial pericardial effusion is present. The pericardial effusion is  circumferential.   4. The mitral valve is normal in structure. No evidence of mitral valve  regurgitation. No evidence of mitral stenosis.   5. The aortic valve is normal in structure. Aortic valve regurgitation is  mild. Mild aortic valve sclerosis is present, with no evidence of aortic  valve stenosis.   6. The inferior vena cava is normal in size with greater than 50%  respiratory variability, suggesting right atrial pressure of 3 mmHg. __________   [facility] 08/19/2016: Coronary angiography:  Coronary dominance: Right  Left mainstem:   Large vessel that bifurcates into the LAD and left circumflex, no significant disease noted  Left anterior descending (LAD):   Large vessel that extends to the apical region, diagonal branch 2 of small to moderate size,  30% proximal LAD disease, 40% mid LAD disease.  70% ostial and proximal disease of the D2 and D3 vessel  Left circumflex (LCx):  Large vessel with OM branch 2, no significant disease noted. Small vessel  Right coronary artery (RCA):  Right dominant vessel with PL and PDA, 40 to 50% ostial disease, 30% proximal disease.   Left ventriculography: Left ventricular  systolic function is normal, LVEF is estimated at 55-65%, there is no significant mitral regurgitation , no significant aortic valve stenosis  Final Conclusions:   Stable mild to moderate LAD disease Moderate to severe ostial and proximal diagonal disease (unchanged or improved from previous study) Patent RCA disease Mild to Moderate ostial RCA disease, mild proximal RCA disease Normal EF  No intervention indicated at this time  Recommendations:  There was ostial RCA spasm, improved with IC NTG Review of previous cath from 2015 suggests some component of coronary spasm.  If she continues to have chest pain, could increase imdur. Would continue NTG as needed for chest pain Ranexa if pain becomes more frequent. __________   Stress echo 01/30/2016: - Stress ECG conclusions: There were no stress arrhythmias or    conduction abnormalities. The stress ECG was normal, although    target heart rate was not achieved.  - Staged echo: Left ventricular ejection fraction was normal at    rest and with stress. EF 55% at rest, improving to 65 to 70% with    stress. Normal echo stress, although target heart rate    suboptimal.   Impressions:   - Normal study after maximal exercise, although target heart rate    was not achieved.  __________   2D echo 02/19/2015: - Left ventricle: The cavity size was normal. Wall thickness was    normal. Systolic function was normal. The estimated ejection    fraction was in the range of 55% to 60%. Wall motion was normal;    there were no regional wall motion abnormalities. Doppler    parameters are consistent with abnormal left ventricular  relaxation (grade 1 diastolic dysfunction).  - Aortic valve: There was mild regurgitation.  - Mitral valve: Calcified annulus.  - Atrial septum: There was increased thickness of the septum,    consistent with lipomatous hypertrophy.   Impressions:   - Normal LV function; grade 1 diastolic dysfunction; mild AI.   __________   Carotid artery ultrasound 02/18/2015: Summary:  Bilateral: intimal wall thickening CCA. Mild calcific plaque origin  ICA. 1-39% ICA stenosis. Vertebral artery flow is antegrade.  __________   For remaining study history, please review chart.    EKG:  EKG is not ordered today.    Recent Labs: 07/24/2021: ALT 16 07/25/2021: Hemoglobin 11.4; Magnesium 2.1; Platelets 272 07/26/2021: BUN 18; Creatinine, Ser 0.85; Potassium 3.6; Sodium 141; TSH 1.618  Recent Lipid Panel    Component Value Date/Time   CHOL 147 07/01/2021 0636   CHOL 192 05/15/2014 0500   TRIG 207 (H) 07/01/2021 0636   TRIG 155 05/15/2014 0500   HDL 32 (L) 07/01/2021 0636   HDL 51 05/15/2014 0500   CHOLHDL 4.6 07/01/2021 0636   VLDL 41 (H) 07/01/2021 0636   VLDL 31 05/15/2014 0500   LDLCALC 74 07/01/2021 0636   LDLCALC 110 (H) 05/15/2014 0500    PHYSICAL EXAM:    VS:  BP 124/74 (BP Location: Left Arm, Patient Position: Sitting, Cuff Size: Normal)   Pulse 72   Ht 5' 1.5" (1.562 m)   Wt 147 lb (66.7 kg)   SpO2 98%   BMI 27.33 kg/m   BMI: Body mass index is 27.33 kg/m.  Physical Exam Vitals reviewed.  Constitutional:      Appearance: She is well-developed.  HENT:     Head: Normocephalic and atraumatic.  Eyes:     General:        Right eye: No discharge.        Left eye: No discharge.  Neck:     Vascular: No JVD.  Cardiovascular:     Rate and Rhythm: Normal rate and regular rhythm.     Heart sounds: Normal heart sounds, S1 normal and S2 normal. Heart sounds not distant. No midsystolic click and no opening snap. No murmur heard.   No friction rub.  Pulmonary:     Effort: Pulmonary effort is normal. No respiratory distress.     Breath sounds: Normal breath sounds. No decreased breath sounds, wheezing or rales.  Chest:     Chest wall: No tenderness.  Abdominal:     General: There is no distension.     Palpations: Abdomen is soft.     Tenderness: There is no abdominal tenderness.   Musculoskeletal:     Cervical back: Normal range of motion.     Right lower leg: No edema.     Left lower leg: No edema.  Skin:    General: Skin is warm and dry.     Nails: There is no clubbing.  Neurological:     Mental Status: She is alert and oriented to person, place, and time.  Psychiatric:        Speech: Speech normal.        Behavior: Behavior normal.        Thought Content: Thought content normal.        Judgment: Judgment normal.    Wt Readings from Last 3 Encounters:  09/29/21 147 lb (66.7 kg)  08/27/21 140 lb (63.5 kg)  07/29/21 140 lb (63.5 kg)     ASSESSMENT & PLAN:   CAD involving the  native coronary arteries without angina: She is doing well without symptoms concerning for angina.  Continue secondary prevention and current medical therapy including aspirin and clopidogrel (in the setting of recurrent CVA, Toprol-XL, and rosuvastatin.  No indication for further ischemic testing at this time.  HFrEF secondary to ICM with subsequent normalization of EF: She appears euvolemic and well compensated.  Recommend she continue Toprol-XL.  Recurrent CVAs: No new deficits.  Zio patch monitoring unrevealing x2.  TEE unrevealing.  Status post ILR per EP.  Follow-up with EP and neurology as directed.  She remains on aspirin and clopidogrel per neurology.  Continue statin as outlined below.  History of ICH: Follow-up with PCP and neurology as directed.  HTN: Blood pressure is well controlled in the office today.  Continue amlodipine and Toprol-XL.  Low-sodium diet encouraged.  HLD: LDL 74 in 06/2021 with goal being less than 60 given recurrent CVA.  Continue Crestor 20 mg daily.  Check CMP, lipid panel, and direct LDL with recommendation to escalate lipid therapy to achieve target LDL as indicated.  Medication management: Patient will call back to our office when she gets home today to review medications and doses with nurse as she is uncertain of the names of the medications she  is taking.  We will provide any further recommendations regarding her medical therapy based on this information.   Disposition: F/u with Dr. Rockey Situ or an APP in 6 months, and EP as directed.   Medication Adjustments/Labs and Tests Ordered: Current medicines are reviewed at length with the patient today.  Concerns regarding medicines are outlined above. Medication changes, Labs and Tests ordered today are summarized above and listed in the Patient Instructions accessible in Encounters.   Signed, Christell Faith, PA-C 09/29/2021 12:13 PM     [name], [place] [id number] [phone]    Addendum 09/29/2021 at 14:45 Patient called back and reported she is taking furosemide 20 mg daily, Toprol-XL 50 mg daily, amlodipine 10 mg daily, pantoprazole 40 mg daily, clopidogrel 75 mg daily, aspirin 81 mg daily, and Crestor 20 mg daily.  No changes in plan as outlined above.

## 2021-09-29 NOTE — Patient Instructions (Addendum)
Medication Instructions:  Your physician recommends that you continue on your current medications as directed. Please refer to the Current Medication list given to you today.  *If you need a refill on your cardiac medications before your next appointment, please call your pharmacy*   Lab Work: CMP, Lipid, Direct LDL  If you have labs (blood work) drawn today and your tests are completely normal, you will receive your results only by: [place] (if you have MyChart) OR A paper copy in the mail If you have any lab test that is abnormal or we need to change your treatment, we will call you to review the results.   Testing/Procedures: None ordered  Follow-Up: At [facility], you and your health needs are our priority.  As part of our continuing mission to provide you with exceptional heart care, we have created designated Provider Care Teams.  These Care Teams include your primary Cardiologist (physician) and Advanced Practice Providers (APPs -  Physician Assistants and Nurse Practitioners) who all work together to provide you with the care you need, when you need it.  We recommend signing up for the patient portal called "MyChart".  Sign up information is provided on this After Visit Summary.  MyChart is used to connect with patients for Virtual Visits (Telemedicine).  Patients are able to view lab/test results, encounter notes, upcoming appointments, etc.  Non-urgent messages can be sent to your provider as well.   To learn more about what you can do with MyChart, go to [email or web address].    Your next appointment:   6 month(s)  The format for your next appointment:   In Person  Provider:   You may see Ida Rogue, MD or one of the following Advanced Practice Providers on your designated Care Team:   Murray Hodgkins, NP Christell Faith, PA-C Cadence Jorene Minors    Other Instructions Please call Olin Hauser, RN, at [phone] when you get home to review medications.

## 2021-09-29 NOTE — Addendum Note (Signed)
Addended by: Valora Corporal on: 09/29/2021 02:08 PM   Modules accepted: Orders

## 2021-09-30 ENCOUNTER — Telehealth: Payer: Self-pay | Admitting: *Deleted

## 2021-09-30 DIAGNOSIS — E782 Mixed hyperlipidemia: Secondary | ICD-10-CM

## 2021-09-30 DIAGNOSIS — I639 Cerebral infarction, unspecified: Secondary | ICD-10-CM

## 2021-09-30 DIAGNOSIS — E785 Hyperlipidemia, unspecified: Secondary | ICD-10-CM

## 2021-09-30 DIAGNOSIS — I5022 Chronic systolic (congestive) heart failure: Secondary | ICD-10-CM

## 2021-09-30 DIAGNOSIS — I2510 Atherosclerotic heart disease of native coronary artery without angina pectoris: Secondary | ICD-10-CM

## 2021-09-30 LAB — COMPREHENSIVE METABOLIC PANEL
ALT: 14 IU/L (ref 0–32)
AST: 13 IU/L (ref 0–40)
Albumin/Globulin Ratio: 1.7 (ref 1.2–2.2)
Albumin: 4.7 g/dL (ref 3.8–4.8)
Alkaline Phosphatase: 115 IU/L (ref 44–121)
BUN/Creatinine Ratio: 16 (ref 12–28)
BUN: 18 mg/dL (ref 8–27)
Bilirubin Total: 0.3 mg/dL (ref 0.0–1.2)
CO2: 24 mmol/L (ref 20–29)
Calcium: 9.8 mg/dL (ref 8.7–10.3)
Chloride: 102 mmol/L (ref 96–106)
Creatinine, Ser: 1.14 mg/dL — ABNORMAL HIGH (ref 0.57–1.00)
Globulin, Total: 2.7 g/dL (ref 1.5–4.5)
Glucose: 109 mg/dL — ABNORMAL HIGH (ref 70–99)
Potassium: 3.7 mmol/L (ref 3.5–5.2)
Sodium: 140 mmol/L (ref 134–144)
Total Protein: 7.4 g/dL (ref 6.0–8.5)
eGFR: 54 mL/min/{1.73_m2} — ABNORMAL LOW (ref >59)

## 2021-09-30 LAB — LIPID PANEL
Chol/HDL Ratio: 2.8 ratio (ref 0.0–4.4)
Cholesterol, Total: 174 mg/dL (ref 100–199)
HDL: 62 mg/dL (ref >39)
LDL Chol Calc (NIH): 90 mg/dL (ref 0–99)
Triglycerides: 123 mg/dL (ref 0–149)
VLDL Cholesterol Cal: 22 mg/dL (ref 5–40)

## 2021-09-30 LAB — LDL CHOLESTEROL, DIRECT: LDL Direct: 94 mg/dL (ref 0–99)

## 2021-09-30 NOTE — Telephone Encounter (Signed)
-----   Message from Rise Mu, PA-C sent at 09/30/2021  7:12 AM EST ----- Random glucose okay Renal function is mildly elevated Liver function normal Potassium normal LDL or bad cholesterol is mildly elevated, though improved from prior  Recommendations: Increase water intake Increase Crestor to 40 mg daily Repeat fasting lipid panel and liver function in 2 months

## 2021-09-30 NOTE — Telephone Encounter (Signed)
Left voicemail message to call back for review of results and recommendations.  

## 2021-10-01 ENCOUNTER — Ambulatory Visit (INDEPENDENT_AMBULATORY_CARE_PROVIDER_SITE_OTHER): Payer: PPO

## 2021-10-01 DIAGNOSIS — M7582 Other shoulder lesions, left shoulder: Secondary | ICD-10-CM | POA: Diagnosis not present

## 2021-10-01 DIAGNOSIS — S42255D Nondisplaced fracture of greater tuberosity of left humerus, subsequent encounter for fracture with routine healing: Secondary | ICD-10-CM | POA: Diagnosis not present

## 2021-10-01 DIAGNOSIS — I639 Cerebral infarction, unspecified: Secondary | ICD-10-CM | POA: Diagnosis not present

## 2021-10-01 NOTE — Telephone Encounter (Signed)
Attempted to call the patient. No answer- I left a message to please call back.  

## 2021-10-04 LAB — CUP PACEART REMOTE DEVICE CHECK
Date Time Interrogation Session: 20221123195009
Implantable Pulse Generator Implant Date: 20221019

## 2021-10-06 NOTE — Telephone Encounter (Signed)
Left voicemail message to call back for review of results and recommendations.  

## 2021-10-07 NOTE — Telephone Encounter (Signed)
Left voicemail message to call back for review of results and recommendations.  

## 2021-10-12 ENCOUNTER — Other Ambulatory Visit: Payer: Self-pay | Admitting: Physician Assistant

## 2021-10-13 NOTE — Telephone Encounter (Signed)
Spoke with Jerrye Beavers listed on release form. He will reach out to patient and have her give [place] a call.

## 2021-10-13 NOTE — Progress Notes (Signed)
Carelink Summary Report / Loop Recorder 

## 2021-10-13 NOTE — Telephone Encounter (Signed)
Left voicemail message to call back for review of results and recommendations.  

## 2021-10-16 ENCOUNTER — Encounter: Payer: Self-pay | Admitting: *Deleted

## 2021-10-16 NOTE — Telephone Encounter (Signed)
Unable to reach patient so letter has been placed in outgoing mail.

## 2021-10-17 DIAGNOSIS — Z7982 Long term (current) use of aspirin: Secondary | ICD-10-CM | POA: Diagnosis not present

## 2021-10-17 DIAGNOSIS — R42 Dizziness and giddiness: Secondary | ICD-10-CM | POA: Diagnosis not present

## 2021-10-17 DIAGNOSIS — I10 Essential (primary) hypertension: Secondary | ICD-10-CM | POA: Diagnosis not present

## 2021-10-17 DIAGNOSIS — Z7902 Long term (current) use of antithrombotics/antiplatelets: Secondary | ICD-10-CM | POA: Diagnosis not present

## 2021-10-17 DIAGNOSIS — Z87891 Personal history of nicotine dependence: Secondary | ICD-10-CM | POA: Diagnosis not present

## 2021-10-17 DIAGNOSIS — Z951 Presence of aortocoronary bypass graft: Secondary | ICD-10-CM | POA: Diagnosis not present

## 2021-10-17 DIAGNOSIS — I739 Peripheral vascular disease, unspecified: Secondary | ICD-10-CM | POA: Diagnosis not present

## 2021-10-17 DIAGNOSIS — I25118 Atherosclerotic heart disease of native coronary artery with other forms of angina pectoris: Secondary | ICD-10-CM | POA: Diagnosis not present

## 2021-10-17 DIAGNOSIS — E7800 Pure hypercholesterolemia, unspecified: Secondary | ICD-10-CM | POA: Diagnosis not present

## 2021-10-17 DIAGNOSIS — Z8673 Personal history of transient ischemic attack (TIA), and cerebral infarction without residual deficits: Secondary | ICD-10-CM | POA: Diagnosis not present

## 2021-10-17 DIAGNOSIS — F411 Generalized anxiety disorder: Secondary | ICD-10-CM | POA: Diagnosis not present

## 2021-10-17 DIAGNOSIS — I2510 Atherosclerotic heart disease of native coronary artery without angina pectoris: Secondary | ICD-10-CM | POA: Diagnosis not present

## 2021-10-17 DIAGNOSIS — I5022 Chronic systolic (congestive) heart failure: Secondary | ICD-10-CM | POA: Diagnosis not present

## 2021-10-27 MED ORDER — ROSUVASTATIN CALCIUM 40 MG PO TABS: 40.0000 mg | ORAL_TABLET | Freq: Every day | ORAL | 3 refills | Status: AC

## 2021-10-27 MED ORDER — CLOPIDOGREL BISULFATE 75 MG PO TABS: 75.0000 mg | ORAL_TABLET | Freq: Every day | ORAL | 0 refills | Status: DC

## 2021-10-27 NOTE — Telephone Encounter (Signed)
Results and recommendations reviewed with patient. Instructed her to increase crestor to 40 mg once daily and she also requested refill of her clopidogrel. Reviewed request for repeat labs and that scheduling will call closer to that time to set that up. She verbalized understanding of our conversation with no further questions at this time.

## 2021-10-27 NOTE — Telephone Encounter (Signed)
Recall placed and printed

## 2021-10-27 NOTE — Addendum Note (Signed)
Addended by: Valora Corporal on: 10/27/2021 11:20 AM   Modules accepted: Orders

## 2021-11-04 ENCOUNTER — Ambulatory Visit (INDEPENDENT_AMBULATORY_CARE_PROVIDER_SITE_OTHER): Payer: PPO

## 2021-11-04 DIAGNOSIS — I255 Ischemic cardiomyopathy: Secondary | ICD-10-CM | POA: Diagnosis not present

## 2021-11-04 LAB — CUP PACEART REMOTE DEVICE CHECK
Date Time Interrogation Session: 20221226195220
Implantable Pulse Generator Implant Date: 20221019

## 2021-11-13 NOTE — Progress Notes (Signed)
Carelink Summary Report / Loop Recorder 

## 2021-11-14 DIAGNOSIS — S42255D Nondisplaced fracture of greater tuberosity of left humerus, subsequent encounter for fracture with routine healing: Secondary | ICD-10-CM | POA: Diagnosis not present

## 2021-11-14 DIAGNOSIS — I779 Disorder of arteries and arterioles, unspecified: Secondary | ICD-10-CM | POA: Diagnosis not present

## 2021-11-14 DIAGNOSIS — M7582 Other shoulder lesions, left shoulder: Secondary | ICD-10-CM | POA: Diagnosis not present

## 2021-11-21 DIAGNOSIS — G4733 Obstructive sleep apnea (adult) (pediatric): Secondary | ICD-10-CM | POA: Diagnosis not present

## 2021-11-27 DIAGNOSIS — G4733 Obstructive sleep apnea (adult) (pediatric): Secondary | ICD-10-CM | POA: Diagnosis not present

## 2021-12-08 ENCOUNTER — Ambulatory Visit (INDEPENDENT_AMBULATORY_CARE_PROVIDER_SITE_OTHER): Payer: PPO

## 2021-12-08 DIAGNOSIS — I255 Ischemic cardiomyopathy: Secondary | ICD-10-CM | POA: Diagnosis not present

## 2021-12-08 LAB — CUP PACEART REMOTE DEVICE CHECK
Date Time Interrogation Session: 20230129230645
Implantable Pulse Generator Implant Date: 20221019

## 2021-12-09 DIAGNOSIS — I10 Essential (primary) hypertension: Secondary | ICD-10-CM | POA: Diagnosis not present

## 2021-12-09 DIAGNOSIS — Z955 Presence of coronary angioplasty implant and graft: Secondary | ICD-10-CM | POA: Diagnosis not present

## 2021-12-09 DIAGNOSIS — I25118 Atherosclerotic heart disease of native coronary artery with other forms of angina pectoris: Secondary | ICD-10-CM | POA: Diagnosis not present

## 2021-12-09 DIAGNOSIS — I739 Peripheral vascular disease, unspecified: Secondary | ICD-10-CM | POA: Diagnosis not present

## 2021-12-09 DIAGNOSIS — Z95818 Presence of other cardiac implants and grafts: Secondary | ICD-10-CM | POA: Diagnosis not present

## 2021-12-09 DIAGNOSIS — I69354 Hemiplegia and hemiparesis following cerebral infarction affecting left non-dominant side: Secondary | ICD-10-CM | POA: Diagnosis not present

## 2021-12-09 DIAGNOSIS — I255 Ischemic cardiomyopathy: Secondary | ICD-10-CM | POA: Diagnosis not present

## 2021-12-09 DIAGNOSIS — Z951 Presence of aortocoronary bypass graft: Secondary | ICD-10-CM | POA: Diagnosis not present

## 2021-12-09 DIAGNOSIS — F3342 Major depressive disorder, recurrent, in full remission: Secondary | ICD-10-CM | POA: Diagnosis not present

## 2021-12-09 DIAGNOSIS — Z7902 Long term (current) use of antithrombotics/antiplatelets: Secondary | ICD-10-CM | POA: Diagnosis not present

## 2021-12-09 DIAGNOSIS — Z7982 Long term (current) use of aspirin: Secondary | ICD-10-CM | POA: Diagnosis not present

## 2021-12-09 DIAGNOSIS — I252 Old myocardial infarction: Secondary | ICD-10-CM | POA: Diagnosis not present

## 2021-12-16 NOTE — Progress Notes (Signed)
Carelink Summary Report / Loop Recorder 

## 2021-12-19 DIAGNOSIS — G4733 Obstructive sleep apnea (adult) (pediatric): Secondary | ICD-10-CM | POA: Diagnosis not present

## 2022-01-02 DIAGNOSIS — R531 Weakness: Secondary | ICD-10-CM | POA: Diagnosis not present

## 2022-01-02 DIAGNOSIS — G4733 Obstructive sleep apnea (adult) (pediatric): Secondary | ICD-10-CM | POA: Diagnosis not present

## 2022-01-12 ENCOUNTER — Ambulatory Visit (INDEPENDENT_AMBULATORY_CARE_PROVIDER_SITE_OTHER): Payer: PPO

## 2022-01-12 DIAGNOSIS — I255 Ischemic cardiomyopathy: Secondary | ICD-10-CM | POA: Diagnosis not present

## 2022-01-12 LAB — CUP PACEART REMOTE DEVICE CHECK
Date Time Interrogation Session: 20230303230255
Implantable Pulse Generator Implant Date: 20221019

## 2022-01-22 DIAGNOSIS — G4733 Obstructive sleep apnea (adult) (pediatric): Secondary | ICD-10-CM | POA: Diagnosis not present

## 2022-01-22 DIAGNOSIS — Z8673 Personal history of transient ischemic attack (TIA), and cerebral infarction without residual deficits: Secondary | ICD-10-CM | POA: Diagnosis not present

## 2022-01-22 DIAGNOSIS — F411 Generalized anxiety disorder: Secondary | ICD-10-CM | POA: Diagnosis not present

## 2022-01-22 DIAGNOSIS — R4189 Other symptoms and signs involving cognitive functions and awareness: Secondary | ICD-10-CM | POA: Diagnosis not present

## 2022-01-22 DIAGNOSIS — R635 Abnormal weight gain: Secondary | ICD-10-CM | POA: Diagnosis not present

## 2022-01-22 DIAGNOSIS — F32A Depression, unspecified: Secondary | ICD-10-CM | POA: Diagnosis not present

## 2022-01-22 NOTE — Progress Notes (Signed)
Carelink Summary Report / Loop Recorder 

## 2022-01-23 ENCOUNTER — Other Ambulatory Visit: Payer: Self-pay | Admitting: Physician Assistant

## 2022-01-30 DIAGNOSIS — G4733 Obstructive sleep apnea (adult) (pediatric): Secondary | ICD-10-CM | POA: Diagnosis not present

## 2022-02-03 DIAGNOSIS — I69354 Hemiplegia and hemiparesis following cerebral infarction affecting left non-dominant side: Secondary | ICD-10-CM | POA: Diagnosis not present

## 2022-02-03 DIAGNOSIS — G4733 Obstructive sleep apnea (adult) (pediatric): Secondary | ICD-10-CM | POA: Diagnosis not present

## 2022-02-03 DIAGNOSIS — I129 Hypertensive chronic kidney disease with stage 1 through stage 4 chronic kidney disease, or unspecified chronic kidney disease: Secondary | ICD-10-CM | POA: Diagnosis not present

## 2022-02-03 DIAGNOSIS — F3342 Major depressive disorder, recurrent, in full remission: Secondary | ICD-10-CM | POA: Diagnosis not present

## 2022-02-03 DIAGNOSIS — Z95818 Presence of other cardiac implants and grafts: Secondary | ICD-10-CM | POA: Diagnosis not present

## 2022-02-03 DIAGNOSIS — N182 Chronic kidney disease, stage 2 (mild): Secondary | ICD-10-CM | POA: Diagnosis not present

## 2022-02-09 DIAGNOSIS — F32A Depression, unspecified: Secondary | ICD-10-CM | POA: Diagnosis not present

## 2022-02-09 DIAGNOSIS — I2510 Atherosclerotic heart disease of native coronary artery without angina pectoris: Secondary | ICD-10-CM | POA: Diagnosis not present

## 2022-02-09 DIAGNOSIS — E7800 Pure hypercholesterolemia, unspecified: Secondary | ICD-10-CM | POA: Diagnosis not present

## 2022-02-09 DIAGNOSIS — I5022 Chronic systolic (congestive) heart failure: Secondary | ICD-10-CM | POA: Diagnosis not present

## 2022-02-09 DIAGNOSIS — G4733 Obstructive sleep apnea (adult) (pediatric): Secondary | ICD-10-CM | POA: Diagnosis not present

## 2022-02-09 DIAGNOSIS — F411 Generalized anxiety disorder: Secondary | ICD-10-CM | POA: Diagnosis not present

## 2022-02-09 DIAGNOSIS — I10 Essential (primary) hypertension: Secondary | ICD-10-CM | POA: Diagnosis not present

## 2022-02-16 ENCOUNTER — Ambulatory Visit (INDEPENDENT_AMBULATORY_CARE_PROVIDER_SITE_OTHER): Payer: PPO

## 2022-02-16 DIAGNOSIS — I255 Ischemic cardiomyopathy: Secondary | ICD-10-CM

## 2022-02-17 LAB — CUP PACEART REMOTE DEVICE CHECK
Date Time Interrogation Session: 20230405230634
Implantable Pulse Generator Implant Date: 20221019

## 2022-02-26 DIAGNOSIS — I10 Essential (primary) hypertension: Secondary | ICD-10-CM | POA: Diagnosis not present

## 2022-03-03 NOTE — Progress Notes (Signed)
Carelink Summary Report / Loop Recorder 

## 2022-03-04 ENCOUNTER — Telehealth: Payer: Self-pay | Admitting: Physician Assistant

## 2022-03-04 NOTE — Telephone Encounter (Signed)
Pt c/o swelling: STAT is pt has developed SOB within 24 hours ? ?If swelling, where is the swelling located? Patient states she has swelling from the knees to the end of her toes.  ? ?How much weight have you gained and in what time span? 13lbs since May of last year.  ? ?Have you gained 3 pounds in a day or 5 pounds in a week? no ? ?Do you have a log of your daily weights (if so, list)? no ? ?Are you currently taking a fluid pill? yes ? ?Are you currently SOB? no ? ?Have you traveled recently? No ? ?Patient doesn't know why she has gained weight she states she hardly eats.  She thinks one of her medication is causing her to gain weight.    ?

## 2022-03-05 DIAGNOSIS — E7800 Pure hypercholesterolemia, unspecified: Secondary | ICD-10-CM | POA: Diagnosis not present

## 2022-03-05 DIAGNOSIS — Z1211 Encounter for screening for malignant neoplasm of colon: Secondary | ICD-10-CM | POA: Diagnosis not present

## 2022-03-05 DIAGNOSIS — I2510 Atherosclerotic heart disease of native coronary artery without angina pectoris: Secondary | ICD-10-CM | POA: Diagnosis not present

## 2022-03-05 DIAGNOSIS — Z720 Tobacco use: Secondary | ICD-10-CM | POA: Diagnosis not present

## 2022-03-05 DIAGNOSIS — F32A Depression, unspecified: Secondary | ICD-10-CM | POA: Diagnosis not present

## 2022-03-05 DIAGNOSIS — I10 Essential (primary) hypertension: Secondary | ICD-10-CM | POA: Diagnosis not present

## 2022-03-05 DIAGNOSIS — Z0001 Encounter for general adult medical examination with abnormal findings: Secondary | ICD-10-CM | POA: Diagnosis not present

## 2022-03-05 DIAGNOSIS — F411 Generalized anxiety disorder: Secondary | ICD-10-CM | POA: Diagnosis not present

## 2022-03-05 DIAGNOSIS — I5022 Chronic systolic (congestive) heart failure: Secondary | ICD-10-CM | POA: Diagnosis not present

## 2022-03-05 DIAGNOSIS — Z1231 Encounter for screening mammogram for malignant neoplasm of breast: Secondary | ICD-10-CM | POA: Diagnosis not present

## 2022-03-05 DIAGNOSIS — G4733 Obstructive sleep apnea (adult) (pediatric): Secondary | ICD-10-CM | POA: Diagnosis not present

## 2022-03-05 NOTE — Telephone Encounter (Signed)
Left voicemail message for patient to call back for review of her concerns.  ?

## 2022-03-09 NOTE — Telephone Encounter (Signed)
Left voicemail message to call back for review of concerns and appointment reminder.  ?

## 2022-03-09 NOTE — Progress Notes (Signed)
? ?Cardiology Office Note   ? ?Date:  03/11/2022  ? ?ID:  [name], DOB 1956/07/21, MRN [id number] ? ?PCP:  Baxter Hire, MD  ?Cardiologist:  Ida Rogue, MD  ?Electrophysiologist:  Vickie Epley, MD  ? ?Chief Complaint: Weight gain and lower extremity swelling ? ?History of Present Illness:  ? ?[name] is a 66 y.o. female with history of CAD with NSTEMI in 03/2013 medically managed with prior stenting to the distal RCA in 12/2013, HFrEF secondary to ICM with subsequent normalization of LVSF, recurrent CVAs (08/2013, 06/2021, 07/2021), ICH in 04/2021, COVID in 07/2019, HTN, HLD, migraine disorder, tobacco use, depression, and GERD who presents for evaluation of weight gain and lower extremity swelling. ?  ?She was admitted to the hospital in 03/2013 with an NSTEMI.  Cath demonstrated two-vessel CAD with an occluded mid RCA with collaterals from the distal LAD.  Medical management was advised.  Remaining cath details included 75% stenosis in D1 near the ostium, 40% mid LAD, 30% distal LAD, 50% D2 disease, 30% proximal circumflex, 75% proximal RCA disease.  Repeat cath in 12/2013 showed 95% distal RCA stenosis status post PCI/DES as well as 40% mid LAD, 30% distal LAD, 80% D1 disease, 60% D2 disease, 50% ostial RCA disease, 50% proximal RCA disease, 60% mid RCA disease.  Most recent cath from 08/2016 showed left LAD 30% proximal stenosis, 40% mid LAD disease, 70% ostial and proximal D2 and D3 disease, RCA 40 to 50% ostial disease, 30% proximal RCA disease. LVSF normal.  Given stability of CAD, medical management was advised. ?  ?She was admitted to [facility] in 08/2013 with a CVA.  Surface echo at that time showed an EF of 60 to 65%, no regional wall motion abnormalities, and mild mitral regurgitation.  Carotid artery ultrasound showed bilateral 1 to 39% ICA stenosis with antegrade vertebral artery flow. ?  ?She was admitted in 04/2021 with right thalamic hemorrhagic CVA with right to left midline shift felt to  be in the setting of hypertensive emergency n.  Echo showed an EF of 55 to 60%, no regional wall motion normalities, grade 1 diastolic dysfunction, normal RV systolic function and ventricular cavity size, trivial circumferential pericardial effusion, mild aortic valve insufficiency, mild aortic valve sclerosis without evidence of stenosis, and an estimated right atrial pressure of 3 mmHg.  Carotid artery ultrasound showed less than 50% bilateral ICA stenosis. ?  ?She was admitted in 06/2021 with a small acute left cerebral infarct and a subacute to chronic right corona radiata lacunar infarct.  Echo showed an EF of 60 to 65%, no regional wall motion abnormalities, moderate LVH, grade 1 diastolic dysfunction, normal RV systolic function and ventricular cavity size, trivial mitral regurgitation, and mild to moderate aortic insufficiency.  Given recent [place] in 04/2021, neurology recommended single antiplatelet therapy.  Outpatient cardiac monitoring demonstrated a predominant rhythm of sinus with an average heart rate of 59 (range 43-1 41), 1 run of NSVT lasting 4 beats, rare PACs and PVCs, and no significant sustained arrhythmias.  Patient triggered events were associated with sinus rhythm. ?  ?She was admitted in 07/2021 with small acute to subacute infarct in the left temporal lobe that was new compared to study in 06/2021.  No acute intracranial hemorrhage.  She was deemed to be outside of tPA window.  Given history of bilateral strokes, she underwent TEE which was negative for intracardiac shunt.  Neurology recommended discharging the patient on aspirin, Plavix, and statin.  At time  of discharge, she noted persistent dizziness, though this was improving.   ?  ?She was seen in the office in 07/2021 and was doing well from a cardiac perspective.  She did continue to note dizziness which has been present since her third stroke earlier that summer.  BP was running in the 462V to 035K systolic.  Low-dose amlodipine was  reinitiated.  She was referred to EP and underwent loop recorder implantation on 08/27/2021.  Due to elevated blood pressure readings noted on phone note, amlodipine has subsequently been titrated to 10 mg daily.  ? ?She was last seen in the office in 09/2021 and was without symptoms of angina or decompensation. Loop recorder interrogation has showed no evidence of arrhythmias.  She was uncertain what medications/doses she was taking at that time with recommendation for her to call back with this information.  ? ?She contacted our office on 03/04/22 noting a 13 pounds weight gain since 03/2021 and appointment was scheduled for today.  ? ?She comes in today noting a 10 pound weight gain dating back to 03/2021.  At that time, she reports by her home scale she was 135 pounds.  By her home scale today she weighed 146 pounds.  By our scale, her weight is largely stable dating back to her first visit with [place] on 02/05/2020 with a weight of 143 pounds at that time and a current in office weight of 146 pounds today.  She also notes a 3-week history of bilateral lower extremity swelling from the knees to the ankles.  She indicates the swelling has been persistent and is not dependent.  She notes that she has no lower extremity swelling today.  She has had a decreased appetite with some abdominal bloating noted.  No orthopnea or PND.  No chest pain, palpitations, dyspnea, dizziness, presyncope, or syncope.  No abdominal pain, constipation, or diarrhea.  No fever or chills. ?  ? ? ?Labs independently reviewed: ?02/2022 - potassium 3.6, BUN 23, SCr 1.0, albumin 4.4, AST/ALT normal, HGB 13.0, PLT 291, TC 146, TG 200, HDL 47, LDL 58 ?08/2021 - magnesium 1.9 ?07/2021 - TSH normal ? ?Past Medical History:  ?Diagnosis Date  ? Anxiety   ? Arthritis   ? CAD (coronary artery disease)   ? a. 03/2013 NSTEMI/Cath: 100 RCA, EF 45%->Med Rx;  b. 12/2013 Cath/PCI: LAD 46m 30d, D1 80, D2 60, LCX nl, RCA 50ost/p, 650m95d (2.5x33 Xience DES);  c.  05/2014 Lexi MV: EF 68%, no ischemia; d. stress echo 12/2015 no ischemia @ max exercise (did not achieve target)  ? Carotid disease, bilateral ([facility]  ? a. 08/2013 Carotid U/S: 1-39% bilat ICA stenosis.  ? Chronic kidney disease   ? Chronic kidney infections. Takes daily preventative.  ? Complication of anesthesia   ? [facility]  ? a. 08/2013.  ? [facility] [place]) ([facility]  ? Decreased libido   ? Depression   ? GERD (gastroesophageal reflux disease)   ? History of 2019 novel coronavirus disease (COVID-19) 07/2019  ? History of recurrent UTIs   ? Hypercholesteremia   ? Hyperlipemia   ? Hypertension   ? Insomnia   ? Ischemic cardiomyopathy   ? a. 03/2013 Echo: EF 30-35%, mod dil LA, mod-sev MR, mod TR;  b. 08/2013 Echo EF 60-65%, mild AI.  ? Menopause   ? Migraines   ? [facility]  ? PONV (postoperative nausea and vomiting)   ? If anesthsia  is given slowly, pt will not throw up, if given quickly, pt will have nausea and vomiting.  ? Right lower quadrant pain   ? Syncope and collapse   ? Tobacco abuse   ? Vaginal atrophy   ? ? ?Past Surgical History:  ?Procedure Laterality Date  ? CARDIAC CATHETERIZATION  01/01/2014  ? CARDIAC CATHETERIZATION  03/16/2013  ? CARDIAC CATHETERIZATION  12/2013  ? CARDIAC CATHETERIZATION N/A 08/19/2016  ? Procedure: Left Heart Cath and Coronary Angiography;  Surgeon: Minna Merritts, MD;  Location: [place] [facility];  Service: Cardiovascular;  Laterality: N/A;  ? CESAREAN SECTION WITH BILATERAL TUBAL LIGATION    ? CORONARY ANGIOPLASTY WITH STENT PLACEMENT  01/01/2014  ? 95% lesion with a drug eluting stent to the distal RCA.  ? ENDOMETRIAL ABLATION    ? KNEE ARTHROSCOPY  11/27/2006  ? left knee   ? OOPHORECTOMY    ? TEE WITHOUT CARDIOVERSION N/A 07/25/2021  ? Procedure: TRANSESOPHAGEAL ECHOCARDIOGRAM (TEE);  Surgeon: Minna Merritts, MD;  Location: [facility] ORS;  Service: Cardiovascular;  Laterality: N/A;  ? TOTAL KNEE ARTHROPLASTY Left  10/06/2016  ? Procedure: TOTAL KNEE ARTHROPLASTY;  Surgeon: Corky Mull, MD;  Location: [facility] ORS;  Service: Orthopedics;  Laterality: Left;  ? TOTAL KNEE ARTHROPLASTY Right 11/12/2020  ? Procedure: TOTAL KNEE AR

## 2022-03-10 NOTE — Telephone Encounter (Signed)
Left voicemail message as reminder for appointment tomorrow and that I was calling to review concerns. This is attempt # 3 so closing encounter.  ?

## 2022-03-11 ENCOUNTER — Encounter: Payer: Self-pay | Admitting: Physician Assistant

## 2022-03-11 ENCOUNTER — Ambulatory Visit (INDEPENDENT_AMBULATORY_CARE_PROVIDER_SITE_OTHER): Payer: No Typology Code available for payment source | Admitting: Physician Assistant

## 2022-03-11 VITALS — BP 108/70 | HR 56 | Ht 62.0 in | Wt 146.0 lb

## 2022-03-11 DIAGNOSIS — I619 Nontraumatic intracerebral hemorrhage, unspecified: Secondary | ICD-10-CM

## 2022-03-11 DIAGNOSIS — I639 Cerebral infarction, unspecified: Secondary | ICD-10-CM

## 2022-03-11 DIAGNOSIS — M7989 Other specified soft tissue disorders: Secondary | ICD-10-CM

## 2022-03-11 DIAGNOSIS — I2510 Atherosclerotic heart disease of native coronary artery without angina pectoris: Secondary | ICD-10-CM

## 2022-03-11 DIAGNOSIS — I5022 Chronic systolic (congestive) heart failure: Secondary | ICD-10-CM

## 2022-03-11 DIAGNOSIS — I255 Ischemic cardiomyopathy: Secondary | ICD-10-CM

## 2022-03-11 DIAGNOSIS — R635 Abnormal weight gain: Secondary | ICD-10-CM | POA: Diagnosis not present

## 2022-03-11 DIAGNOSIS — E785 Hyperlipidemia, unspecified: Secondary | ICD-10-CM | POA: Diagnosis not present

## 2022-03-11 DIAGNOSIS — I10 Essential (primary) hypertension: Secondary | ICD-10-CM

## 2022-03-11 MED ORDER — NITROGLYCERIN 0.4 MG SL SUBL: 0.4000 mg | SUBLINGUAL_TABLET | SUBLINGUAL | 0 refills | Status: AC | PRN

## 2022-03-11 NOTE — Patient Instructions (Signed)
Medication Instructions:  ? ?Your physician has recommended you make the following change in your medication:  ? ?STOP Amlodipine ? ?*If you need a refill on your cardiac medications before your next appointment, please call your pharmacy* ? ? ?Lab Work: ? ?None Ordered ? ?If you have labs (blood work) drawn today and your tests are completely normal, you will receive your results only by: ?MyChart Message (if you have MyChart) OR ?A paper copy in the mail ?If you have any lab test that is abnormal or we need to change your treatment, we will call you to review the results. ? ? ?Testing/Procedures: ? ?Echocardiogram  ? ?Your physician has requested that you have an echocardiogram. Echocardiography is a painless test that uses sound waves to create images of your heart. It provides your doctor with information about the size and shape of your heart and how well your heart?s chambers and valves are working. This procedure takes approximately one hour. There are no restrictions for this procedure. Please note; depending on visual quality an IV may need to be placed.  ? ? ? ?Follow-Up: ?At [facility], you and your health needs are our priority.  As part of our continuing mission to provide you with exceptional heart care, we have created designated Provider Care Teams.  These Care Teams include your primary Cardiologist (physician) and Advanced Practice Providers (APPs -  Physician Assistants and Nurse Practitioners) who all work together to provide you with the care you need, when you need it. ? ?We recommend signing up for the patient portal called "MyChart".  Sign up information is provided on this After Visit Summary.  MyChart is used to connect with patients for Virtual Visits (Telemedicine).  Patients are able to view lab/test results, encounter notes, upcoming appointments, etc.  Non-urgent messages can be sent to your provider as well.   ?To learn more about what you can do with MyChart, go to  [email or web address].   ? ?Your next appointment:   ?2 month(s) ? ?The format for your next appointment:   ?In Person ? ?Provider:   ?You may see Ida Rogue, MD or one of the following Advanced Practice Providers on your designated Care Team:   ?Murray Hodgkins, NP ?Christell Faith, PA-C ?Cadence Kathlen Mody, PA-C ?Important Information About Sugar ? ? ? ? ? ? ?

## 2022-03-12 ENCOUNTER — Ambulatory Visit (INDEPENDENT_AMBULATORY_CARE_PROVIDER_SITE_OTHER): Payer: No Typology Code available for payment source

## 2022-03-12 DIAGNOSIS — I5022 Chronic systolic (congestive) heart failure: Secondary | ICD-10-CM | POA: Diagnosis not present

## 2022-03-12 LAB — ECHOCARDIOGRAM COMPLETE
AR max vel: 3.23 cm2
AV Area VTI: 3.36 cm2
AV Area mean vel: 3.17 cm2
AV Mean grad: 2 mmHg
AV Peak grad: 4 mmHg
AV Vena cont: 0.55 cm
Ao pk vel: 1 m/s
Area-P 1/2: 2.87 cm2
Calc EF: 68.7 %
P 1/2 time: 1337 msec
S' Lateral: 3 cm
Single Plane A2C EF: 69.6 %
Single Plane A4C EF: 67.9 %

## 2022-03-17 ENCOUNTER — Telehealth: Payer: Self-pay | Admitting: *Deleted

## 2022-03-17 NOTE — Telephone Encounter (Signed)
Left voicemail message to call back for review of results.  

## 2022-03-17 NOTE — Telephone Encounter (Signed)
-----   Message from Rise Mu, PA-C sent at 03/12/2022  4:02 PM EDT ----- ?Echo showed normal pump function, normal wall motion, slightly stiffened heart, normal right-sided pump function, mildly leaky aortic valve, and normal pressure within the heart.  Overall reassuring study.  Continue to recommend optimal blood pressure control. ?

## 2022-03-18 ENCOUNTER — Other Ambulatory Visit: Payer: Self-pay | Admitting: Physician Assistant

## 2022-03-20 NOTE — Telephone Encounter (Signed)
Attempted to call the patient. No answer- I left a message to please call back.  

## 2022-03-23 ENCOUNTER — Ambulatory Visit (INDEPENDENT_AMBULATORY_CARE_PROVIDER_SITE_OTHER): Payer: PPO

## 2022-03-23 DIAGNOSIS — I255 Ischemic cardiomyopathy: Secondary | ICD-10-CM

## 2022-03-23 LAB — CUP PACEART REMOTE DEVICE CHECK
Date Time Interrogation Session: 20230510230319
Implantable Pulse Generator Implant Date: 20221019

## 2022-03-24 ENCOUNTER — Encounter: Payer: Self-pay | Admitting: *Deleted

## 2022-03-24 NOTE — Telephone Encounter (Signed)
Error

## 2022-03-24 NOTE — Telephone Encounter (Signed)
Left voicemail message that I was calling to review her echo results and that I will send her a letter and to call back if she should have any further questions.  ?

## 2022-03-24 NOTE — Telephone Encounter (Signed)
Letter with results mailed to patient with instructions to please call if she has any questions.  ?

## 2022-03-30 ENCOUNTER — Ambulatory Visit: Payer: PPO | Admitting: Cardiovascular Disease

## 2022-04-10 NOTE — Progress Notes (Signed)
Carelink Summary Report / Loop Recorder 

## 2022-04-24 ENCOUNTER — Other Ambulatory Visit: Payer: Self-pay | Admitting: Physician Assistant

## 2022-04-27 ENCOUNTER — Ambulatory Visit (INDEPENDENT_AMBULATORY_CARE_PROVIDER_SITE_OTHER): Payer: PPO

## 2022-04-27 DIAGNOSIS — I255 Ischemic cardiomyopathy: Secondary | ICD-10-CM

## 2022-04-28 LAB — CUP PACEART REMOTE DEVICE CHECK
Date Time Interrogation Session: 20230612230312
Implantable Pulse Generator Implant Date: 20221019

## 2022-05-05 ENCOUNTER — Other Ambulatory Visit: Payer: Self-pay | Admitting: Physician Assistant

## 2022-05-12 NOTE — Progress Notes (Deleted)
Cardiology Office Note  Date:  05/12/2022   ID:  [name], [name] 02/12/1956, MRN [id number]  PCP:  Baxter Hire, MD   No chief complaint on file.   HPI:  [name] is a 66 year old woman with long history of  smoking who stopped in May 2014,  coronary artery disease with non-ST elevation in may 2015 with stent placed to her distal RCA,  stroke October 2014 evaluated at [facility].  Previous anginal symptoms included back pain, chest pain like an elephant sitting on her chest cardiac catheterization performed for anginal symptoms, Cath 08/19/2016:, No intervention performed  She presents for routine followup of her coronary artery disease.   Last seen in clinic by myself March 2022    In follow-up today reports that she has completed knee replacement, completed her therapy  Stressful day today,  Cleaning all day,for inspection by the state of her house  Has appreciated some palpitations, beat with a pause Can happen several times a day sometimes  Does regular exercise, Arts and crafts Stopped working back in 2014  Denies chest pain concerning for angina, may have rare episodes from time to time Reports that she quit smoking, wishes that she did not Not requiring nitro  EKG personally reviewed by myself on todays visit Normal sinus rhythm rate 77 bpm nonspecific T wave abnormality  Other past medical history reviewed covid positive sept 2020  cardiac catheterization  08/19/2016: Left anterior descending (LAD):30% proximal LAD disease, 40% mid LAD disease. 70% ostial and proximal disease of the D2 and D3 vessel Right coronary artery (RCA):40 to 50% ostial disease, 30% proximal disease.  Left ventriculography: Left ventricular systolic function is normal, LVEF is estimated at 55-65%, there is no significant mitral regurgitation , no significant aortic valve stenosis  Stable mild to moderate LAD disease Moderate to severe ostial and proximal diagonal disease  (unchanged or improved from previous study) Mild to Moderate ostial RCA disease, mild proximal RCA disease Normal EF No intervention performed   she was noted to have some coronary spasm that resolved with nitroglycerin    hospitalization 12/31/2013 where she presented with angina. He had a catheterization with distal RCA stent placed.  Also started on ranexa, continued on isosorbide.    Previous Cardiac catheterization report 01/01/2014 details 95% distal RCA lesion with stent placed. Also had 40% mid LAD, 30% distal LAD, 80% D1 disease, 60% D2 disease, 50% ostial RCA disease, 50% proximal RCA disease, 60% mid RCA disease stent placed was a xience  EX 2.5 millimeter stent   Cardiac catheterization in 03/16/2013 details occluded mid RCA with collaterals from distal LAD also 75% stenosis diagonal #1 near the ostium Ejection fraction at that time was 46% notes also detailed 40% mid LAD, 30% distal LAD, 50% D2 disease, 30% proximal circumflex, 75% proximal RCA disease  PMH:   has a past medical history of Anxiety, Arthritis, CAD (coronary artery disease), Carotid disease, bilateral ([place]), Chronic kidney disease, Complication of anesthesia, CVA (cerebral vascular accident) ([place]), CVA (cerebral vascular accident) ([place]), Decreased libido, Depression, GERD (gastroesophageal reflux disease), History of 2019 novel coronavirus disease (COVID-19) (07/2019), History of recurrent UTIs, Hypercholesteremia, Hyperlipemia, Hypertension, Insomnia, Ischemic cardiomyopathy, Menopause, Migraines, Myocardial infarction ([place]), PONV (postoperative nausea and vomiting), Right lower quadrant pain, Syncope and collapse, Tobacco abuse, and Vaginal atrophy.  PSH:    Past Surgical History:  Procedure Laterality Date   CARDIAC CATHETERIZATION  01/01/2014   CARDIAC CATHETERIZATION  03/16/2013   CARDIAC CATHETERIZATION  12/2013  CARDIAC CATHETERIZATION N/A 08/19/2016   Procedure: Left Heart Cath and Coronary Angiography;   Surgeon: Minna Merritts, MD;  Location: [place] [facility];  Service: Cardiovascular;  Laterality: N/A;   CESAREAN SECTION WITH BILATERAL TUBAL LIGATION     CORONARY ANGIOPLASTY WITH STENT PLACEMENT  01/01/2014   95% lesion with a drug eluting stent to the distal RCA.   ENDOMETRIAL ABLATION     KNEE ARTHROSCOPY  11/27/2006   left knee    OOPHORECTOMY     TEE WITHOUT CARDIOVERSION N/A 07/25/2021   Procedure: TRANSESOPHAGEAL ECHOCARDIOGRAM (TEE);  Surgeon: Minna Merritts, MD;  Location: [facility];  Service: Cardiovascular;  Laterality: N/A;   TOTAL KNEE ARTHROPLASTY Left 10/06/2016   Procedure: TOTAL KNEE ARTHROPLASTY;  Surgeon: Corky Mull, MD;  Location: [facility];  Service: Orthopedics;  Laterality: Left;   TOTAL KNEE ARTHROPLASTY Right 11/12/2020   Procedure: TOTAL KNEE ARTHROPLASTY;  Surgeon: Corky Mull, MD;  Location: [facility] OR[facility];  Service: Orthopedics;  Laterality: Right;   TUBAL LIGATION      Current Outpatient Medications  Medication Sig Dispense Refill   amLODipine (NORVASC) 10 MG tablet Take 1 tablet (10 mg total) by mouth daily. 90 tablet 3   aspirin EC 81 MG EC tablet Take 1 tablet (81 mg total) by mouth daily. Swallow whole. 30 tablet 0   clopidogrel (PLAVIX) 75 MG tablet TAKE 1 TABLET(75 MG) BY MOUTH DAILY 90 tablet 0   FLUoxetine (PROZAC) 20 MG capsule Take 20 mg by mouth daily.     furosemide (LASIX) 20 MG tablet Take 20 mg by mouth daily.     metoprolol succinate (TOPROL-XL) 50 MG 24 hr tablet TAKE 1 TABLET(50 MG) BY MOUTH DAILY WITH OR IMMEDIATELY FOLLOWING A MEAL 90 tablet 0   nitroGLYCERIN (NITROSTAT) 0.4 MG SL tablet Place 1 tablet (0.4 mg total) under the tongue every 5 (five) minutes as needed for chest pain (If you experience chest pain: Place 1 nitroglycerin under your tongue, while sitting. If no relief of pain, may repeat 1 tablet every 5 minutes up to 3 tablets over 15 minutes. If no relief call 911. If you have dizziness/lightheadedness while taking  nitroglycerin, stop taking and call 911.). 25 tablet 0   pantoprazole (PROTONIX) 40 MG tablet Take 40 mg by mouth 2 (two) times daily.     rosuvastatin (CRESTOR) 40 MG tablet Take 1 tablet (40 mg total) by mouth daily. 90 tablet 3   No current facility-administered medications for this visit.    Allergies:   Codeine sulfate and Penicillins   Social History:  The patient  reports that she quit smoking about 9 years ago. Her smoking use included cigarettes. She has a 30.00 pack-year smoking history. She has never used smokeless tobacco. She reports that she does not currently use alcohol. She reports that she does not use drugs.   Family History:   family history includes Breast cancer in her paternal grandmother; Hyperlipidemia in her mother; Hypertension in her mother, sister, and sister; Stroke in her mother.    Review of Systems: Review of Systems  Constitutional: Negative.   HENT: Negative.    Respiratory: Negative.    Cardiovascular: Negative.   Gastrointestinal: Negative.   Musculoskeletal:  Positive for joint pain.       Knee pain  Neurological: Negative.   Psychiatric/Behavioral: Negative.    All other systems reviewed and are negative.   PHYSICAL EXAM: VS:  There were no vitals taken for this visit. , BMI  There is no height or weight on file to calculate BMI. Constitutional:  oriented to person, place, and time. No distress.  HENT:  Head: Grossly normal Eyes:  no discharge. No scleral icterus.  Neck: No JVD, no carotid bruits  Cardiovascular: Regular rate and rhythm, no murmurs appreciated Pulmonary/Chest: Clear to auscultation bilaterally, no wheezes or rails Abdominal: Soft.  no distension.  no tenderness.  Musculoskeletal: Normal range of motion Neurological:  normal muscle tone. Coordination normal. No atrophy Skin: Skin warm and dry Psychiatric: normal affect, pleasant  Recent Labs: 07/25/2021: Hemoglobin 11.4; Magnesium 2.1; Platelets 272 07/26/2021: TSH  1.618 09/29/2021: ALT 14; BUN 18; Creatinine, Ser 1.14; Potassium 3.7; Sodium 140    Lipid Panel Lab Results  Component Value Date   CHOL 174 09/29/2021   HDL 62 09/29/2021   LDLCALC 90 09/29/2021   TRIG 123 09/29/2021    Wt Readings from Last 3 Encounters:  03/11/22 146 lb (66.2 kg)  09/29/21 147 lb (66.7 kg)  08/27/21 140 lb (63.5 kg)      ASSESSMENT AND PLAN:  CAD in native artery - Plan: EKG 12-Lead Currently with no symptoms of angina. No further workup at this time. Continue current medication regimen. Statin myalgias in the past Tolerating Crestor  Essential hypertension - Plan: EKG 12-Lead Elevated pressure today Had a busy day, cleaning apartment, inspection tomorrow We will start metoprolol succinate 25 up to 50 mg daily Recommend she monitor her blood pressure and call us with some numbers Typically blood pressure runs 833 systolic  Pure hypercholesterolemia Previously on Lipitor Zetia Continue Crestor 20  Bilateral carotid artery disease ([place]) Reports that she quit smoking Last ultrasound 2016, less than 39% disease bilaterally No bruits on exam  Angina pectoris ([place]) Currently without chest pain symptoms. Stable disease on cardiac catheterization In 2017  Currently with no symptoms of angina. No further workup at this time. Continue current medication regimen.  History of coronary artery stent placement On aspirin    Total encounter time more than 25 minutes  Greater than 50% was spent in counseling and coordination of care with the patient  Disposition:   F/U  12 months   No orders of the defined types were placed in this encounter.    Signed, Esmond Plants, M.D., Ph.D. 05/12/2022  [place], [place]

## 2022-05-13 ENCOUNTER — Ambulatory Visit: Payer: No Typology Code available for payment source | Admitting: Cardiovascular Disease

## 2022-05-13 DIAGNOSIS — I5022 Chronic systolic (congestive) heart failure: Secondary | ICD-10-CM

## 2022-05-13 DIAGNOSIS — I25118 Atherosclerotic heart disease of native coronary artery with other forms of angina pectoris: Secondary | ICD-10-CM

## 2022-05-13 DIAGNOSIS — I6523 Occlusion and stenosis of bilateral carotid arteries: Secondary | ICD-10-CM

## 2022-05-13 DIAGNOSIS — I10 Essential (primary) hypertension: Secondary | ICD-10-CM

## 2022-05-13 DIAGNOSIS — E785 Hyperlipidemia, unspecified: Secondary | ICD-10-CM

## 2022-05-13 DIAGNOSIS — I255 Ischemic cardiomyopathy: Secondary | ICD-10-CM

## 2022-05-14 ENCOUNTER — Encounter: Payer: Self-pay | Admitting: Cardiovascular Disease

## 2022-05-19 NOTE — Progress Notes (Signed)
Carelink Summary Report / Loop Recorder 

## 2022-05-22 ENCOUNTER — Other Ambulatory Visit: Payer: Self-pay | Admitting: Physician Assistant

## 2022-05-22 NOTE — Telephone Encounter (Signed)
Patient scheduled 09/11

## 2022-05-22 NOTE — Telephone Encounter (Signed)
Please reschedule 2 month F/U-patient did not show for last appointment. Thank you!

## 2022-05-26 NOTE — Telephone Encounter (Signed)
Please advise if ok to refill historical medication. ? ?

## 2022-06-01 ENCOUNTER — Ambulatory Visit (INDEPENDENT_AMBULATORY_CARE_PROVIDER_SITE_OTHER): Payer: PPO

## 2022-06-01 DIAGNOSIS — I639 Cerebral infarction, unspecified: Secondary | ICD-10-CM

## 2022-06-01 LAB — CUP PACEART REMOTE DEVICE CHECK
Date Time Interrogation Session: 20230715230750
Implantable Pulse Generator Implant Date: 20221019

## 2022-06-10 ENCOUNTER — Other Ambulatory Visit: Payer: Self-pay | Admitting: Physician Assistant

## 2022-06-15 DIAGNOSIS — R399 Unspecified symptoms and signs involving the genitourinary system: Secondary | ICD-10-CM | POA: Diagnosis not present

## 2022-06-19 ENCOUNTER — Inpatient Hospital Stay (HOSPITAL_COMMUNITY): Payer: No Typology Code available for payment source

## 2022-06-19 ENCOUNTER — Inpatient Hospital Stay
Admission: EM | Admit: 2022-06-19 | Discharge: 2022-06-19 | DRG: 066 | Disposition: A | Discharge status: Other Acute Inpatient Hospital | Payer: No Typology Code available for payment source | Source: Other Acute Inpatient Hospital | Attending: Neurology | Admitting: Neurology

## 2022-06-19 ENCOUNTER — Encounter (HOSPITAL_COMMUNITY): Payer: Self-pay

## 2022-06-19 ENCOUNTER — Other Ambulatory Visit: Payer: Self-pay

## 2022-06-19 ENCOUNTER — Emergency Department: Payer: No Typology Code available for payment source

## 2022-06-19 ENCOUNTER — Inpatient Hospital Stay (HOSPITAL_COMMUNITY)
Admission: AD | Admit: 2022-06-19 | Discharge: 2022-06-26 | DRG: 064 | Disposition: A | Discharge status: Skilled Nursing Facility | Payer: No Typology Code available for payment source | Source: Other Acute Inpatient Hospital | Attending: Internal Medicine | Admitting: Internal Medicine

## 2022-06-19 DIAGNOSIS — F419 Anxiety disorder, unspecified: Secondary | ICD-10-CM | POA: Diagnosis not present

## 2022-06-19 DIAGNOSIS — Z8673 Personal history of transient ischemic attack (TIA), and cerebral infarction without residual deficits: Secondary | ICD-10-CM | POA: Diagnosis not present

## 2022-06-19 DIAGNOSIS — R1310 Dysphagia, unspecified: Secondary | ICD-10-CM | POA: Diagnosis not present

## 2022-06-19 DIAGNOSIS — I63233 Cerebral infarction due to unspecified occlusion or stenosis of bilateral carotid arteries: Secondary | ICD-10-CM | POA: Diagnosis not present

## 2022-06-19 DIAGNOSIS — Z87891 Personal history of nicotine dependence: Secondary | ICD-10-CM

## 2022-06-19 DIAGNOSIS — I69391 Dysphagia following cerebral infarction: Secondary | ICD-10-CM | POA: Diagnosis not present

## 2022-06-19 DIAGNOSIS — F32A Depression, unspecified: Secondary | ICD-10-CM | POA: Diagnosis present

## 2022-06-19 DIAGNOSIS — G4700 Insomnia, unspecified: Secondary | ICD-10-CM | POA: Diagnosis present

## 2022-06-19 DIAGNOSIS — R29707 NIHSS score 7: Secondary | ICD-10-CM | POA: Diagnosis not present

## 2022-06-19 DIAGNOSIS — R4701 Aphasia: Secondary | ICD-10-CM | POA: Diagnosis present

## 2022-06-19 DIAGNOSIS — R471 Dysarthria and anarthria: Secondary | ICD-10-CM | POA: Diagnosis not present

## 2022-06-19 DIAGNOSIS — I619 Nontraumatic intracerebral hemorrhage, unspecified: Secondary | ICD-10-CM | POA: Diagnosis present

## 2022-06-19 DIAGNOSIS — R29810 Facial weakness: Secondary | ICD-10-CM | POA: Diagnosis not present

## 2022-06-19 DIAGNOSIS — Z8249 Family history of ischemic heart disease and other diseases of the circulatory system: Secondary | ICD-10-CM

## 2022-06-19 DIAGNOSIS — G935 Compression of brain: Secondary | ICD-10-CM | POA: Diagnosis present

## 2022-06-19 DIAGNOSIS — I618 Other nontraumatic intracerebral hemorrhage: Secondary | ICD-10-CM | POA: Diagnosis not present

## 2022-06-19 DIAGNOSIS — I130 Hypertensive heart and chronic kidney disease with heart failure and stage 1 through stage 4 chronic kidney disease, or unspecified chronic kidney disease: Secondary | ICD-10-CM | POA: Diagnosis not present

## 2022-06-19 DIAGNOSIS — R2689 Other abnormalities of gait and mobility: Secondary | ICD-10-CM | POA: Diagnosis not present

## 2022-06-19 DIAGNOSIS — I639 Cerebral infarction, unspecified: Secondary | ICD-10-CM | POA: Diagnosis not present

## 2022-06-19 DIAGNOSIS — I10 Essential (primary) hypertension: Secondary | ICD-10-CM | POA: Diagnosis present

## 2022-06-19 DIAGNOSIS — E7800 Pure hypercholesterolemia, unspecified: Secondary | ICD-10-CM | POA: Diagnosis not present

## 2022-06-19 DIAGNOSIS — I610 Nontraumatic intracerebral hemorrhage in hemisphere, subcortical: Secondary | ICD-10-CM | POA: Diagnosis present

## 2022-06-19 DIAGNOSIS — Z7982 Long term (current) use of aspirin: Secondary | ICD-10-CM

## 2022-06-19 DIAGNOSIS — N182 Chronic kidney disease, stage 2 (mild): Secondary | ICD-10-CM | POA: Diagnosis not present

## 2022-06-19 DIAGNOSIS — I6389 Other cerebral infarction: Secondary | ICD-10-CM | POA: Diagnosis not present

## 2022-06-19 DIAGNOSIS — I255 Ischemic cardiomyopathy: Secondary | ICD-10-CM | POA: Diagnosis present

## 2022-06-19 DIAGNOSIS — Z87440 Personal history of urinary (tract) infections: Secondary | ICD-10-CM

## 2022-06-19 DIAGNOSIS — G936 Cerebral edema: Secondary | ICD-10-CM | POA: Diagnosis present

## 2022-06-19 DIAGNOSIS — Z823 Family history of stroke: Secondary | ICD-10-CM

## 2022-06-19 DIAGNOSIS — Z803 Family history of malignant neoplasm of breast: Secondary | ICD-10-CM

## 2022-06-19 DIAGNOSIS — I252 Old myocardial infarction: Secondary | ICD-10-CM

## 2022-06-19 DIAGNOSIS — G8191 Hemiplegia, unspecified affecting right dominant side: Secondary | ICD-10-CM | POA: Diagnosis present

## 2022-06-19 DIAGNOSIS — I25118 Atherosclerotic heart disease of native coronary artery with other forms of angina pectoris: Secondary | ICD-10-CM | POA: Diagnosis not present

## 2022-06-19 DIAGNOSIS — E1165 Type 2 diabetes mellitus with hyperglycemia: Secondary | ICD-10-CM | POA: Diagnosis present

## 2022-06-19 DIAGNOSIS — Z8616 Personal history of COVID-19: Secondary | ICD-10-CM

## 2022-06-19 DIAGNOSIS — I672 Cerebral atherosclerosis: Secondary | ICD-10-CM | POA: Diagnosis not present

## 2022-06-19 DIAGNOSIS — R270 Ataxia, unspecified: Secondary | ICD-10-CM | POA: Diagnosis not present

## 2022-06-19 DIAGNOSIS — I2510 Atherosclerotic heart disease of native coronary artery without angina pectoris: Secondary | ICD-10-CM | POA: Diagnosis present

## 2022-06-19 DIAGNOSIS — M1990 Unspecified osteoarthritis, unspecified site: Secondary | ICD-10-CM | POA: Diagnosis not present

## 2022-06-19 DIAGNOSIS — E876 Hypokalemia: Secondary | ICD-10-CM | POA: Diagnosis not present

## 2022-06-19 DIAGNOSIS — G43909 Migraine, unspecified, not intractable, without status migrainosus: Secondary | ICD-10-CM | POA: Diagnosis present

## 2022-06-19 DIAGNOSIS — G9341 Metabolic encephalopathy: Secondary | ICD-10-CM | POA: Diagnosis not present

## 2022-06-19 DIAGNOSIS — I629 Nontraumatic intracranial hemorrhage, unspecified: Secondary | ICD-10-CM | POA: Diagnosis not present

## 2022-06-19 DIAGNOSIS — Z955 Presence of coronary angioplasty implant and graft: Secondary | ICD-10-CM

## 2022-06-19 DIAGNOSIS — R278 Other lack of coordination: Secondary | ICD-10-CM | POA: Diagnosis not present

## 2022-06-19 DIAGNOSIS — K219 Gastro-esophageal reflux disease without esophagitis: Secondary | ICD-10-CM | POA: Diagnosis present

## 2022-06-19 DIAGNOSIS — M6281 Muscle weakness (generalized): Secondary | ICD-10-CM | POA: Diagnosis not present

## 2022-06-19 DIAGNOSIS — Z83438 Family history of other disorder of lipoprotein metabolism and other lipidemia: Secondary | ICD-10-CM

## 2022-06-19 DIAGNOSIS — E1122 Type 2 diabetes mellitus with diabetic chronic kidney disease: Secondary | ICD-10-CM | POA: Diagnosis present

## 2022-06-19 DIAGNOSIS — R531 Weakness: Secondary | ICD-10-CM | POA: Diagnosis not present

## 2022-06-19 DIAGNOSIS — I5022 Chronic systolic (congestive) heart failure: Secondary | ICD-10-CM | POA: Diagnosis not present

## 2022-06-19 DIAGNOSIS — Z79899 Other long term (current) drug therapy: Secondary | ICD-10-CM

## 2022-06-19 DIAGNOSIS — I6622 Occlusion and stenosis of left posterior cerebral artery: Secondary | ICD-10-CM | POA: Diagnosis not present

## 2022-06-19 DIAGNOSIS — I6319 Cerebral infarction due to embolism of other precerebral artery: Principal | ICD-10-CM

## 2022-06-19 DIAGNOSIS — G319 Degenerative disease of nervous system, unspecified: Secondary | ICD-10-CM | POA: Diagnosis not present

## 2022-06-19 DIAGNOSIS — Z7401 Bed confinement status: Secondary | ICD-10-CM | POA: Diagnosis not present

## 2022-06-19 DIAGNOSIS — I160 Hypertensive urgency: Secondary | ICD-10-CM | POA: Diagnosis present

## 2022-06-19 DIAGNOSIS — I69320 Aphasia following cerebral infarction: Secondary | ICD-10-CM | POA: Diagnosis not present

## 2022-06-19 LAB — COMPREHENSIVE METABOLIC PANEL
ALT: 24 U/L (ref 0–44)
AST: 25 U/L (ref 15–41)
Albumin: 3.8 g/dL (ref 3.5–5.0)
Alkaline Phosphatase: 79 U/L (ref 38–126)
Anion gap: 10 (ref 5–15)
BUN: 15 mg/dL (ref 8–23)
CO2: 23 mmol/L (ref 22–32)
Calcium: 9.3 mg/dL (ref 8.9–10.3)
Chloride: 110 mmol/L (ref 98–111)
Creatinine, Ser: 0.86 mg/dL (ref 0.44–1.00)
GFR, Estimated: >60 mL/min (ref >60)
Glucose, Bld: 100 mg/dL — ABNORMAL HIGH (ref 70–99)
Potassium: 3.5 mmol/L (ref 3.5–5.1)
Sodium: 143 mmol/L (ref 135–145)
Total Bilirubin: 0.7 mg/dL (ref 0.3–1.2)
Total Protein: 7 g/dL (ref 6.5–8.1)

## 2022-06-19 LAB — URINE DRUG SCREEN, QUALITATIVE (ARMC ONLY)
Amphetamines, Ur Screen: NOT DETECTED
Barbiturates, Ur Screen: NOT DETECTED
Benzodiazepine, Ur Scrn: NOT DETECTED
Cannabinoid 50 Ng, Ur ~~LOC~~: NOT DETECTED
Cocaine Metabolite,Ur ~~LOC~~: NOT DETECTED
MDMA (Ecstasy)Ur Screen: NOT DETECTED
Methadone Scn, Ur: NOT DETECTED
Opiate, Ur Screen: NOT DETECTED
Phencyclidine (PCP) Ur S: NOT DETECTED
Tricyclic, Ur Screen: NOT DETECTED

## 2022-06-19 LAB — DIFFERENTIAL
Abs Immature Granulocytes: 0.05 10*3/uL (ref 0.00–0.07)
Basophils Absolute: 0 10*3/uL (ref 0.0–0.1)
Basophils Relative: 0 %
Eosinophils Absolute: 0.2 10*3/uL (ref 0.0–0.5)
Eosinophils Relative: 2 %
Immature Granulocytes: 1 %
Lymphocytes Relative: 23 %
Lymphs Abs: 2.3 10*3/uL (ref 0.7–4.0)
Monocytes Absolute: 0.9 10*3/uL (ref 0.1–1.0)
Monocytes Relative: 9 %
Neutro Abs: 6.7 10*3/uL (ref 1.7–7.7)
Neutrophils Relative %: 65 %

## 2022-06-19 LAB — CBC
HCT: 38.8 % (ref 36.0–46.0)
Hemoglobin: 12.8 g/dL (ref 12.0–15.0)
MCH: 30.5 pg (ref 26.0–34.0)
MCHC: 33 g/dL (ref 30.0–36.0)
MCV: 92.4 fL (ref 80.0–100.0)
Platelets: 276 10*3/uL (ref 150–400)
RBC: 4.2 MIL/uL (ref 3.87–5.11)
RDW: 13.2 % (ref 11.5–15.5)
WBC: 10.2 10*3/uL (ref 4.0–10.5)
nRBC: 0 % (ref 0.0–0.2)

## 2022-06-19 LAB — URINALYSIS, ROUTINE W REFLEX MICROSCOPIC
Bilirubin Urine: NEGATIVE
Glucose, UA: NEGATIVE mg/dL
Hgb urine dipstick: NEGATIVE
Ketones, ur: NEGATIVE mg/dL
Leukocytes,Ua: NEGATIVE
Nitrite: NEGATIVE
Protein, ur: NEGATIVE mg/dL
Specific Gravity, Urine: 1.009 (ref 1.005–1.030)
pH: 7 (ref 5.0–8.0)

## 2022-06-19 LAB — MRSA NEXT GEN BY PCR, NASAL: MRSA by PCR Next Gen: NOT DETECTED

## 2022-06-19 LAB — APTT: aPTT: 25 seconds (ref 24–36)

## 2022-06-19 LAB — ECHOCARDIOGRAM COMPLETE
Area-P 1/2: 5.02 cm2
S' Lateral: 3 cm
Weight: 2335.11 oz

## 2022-06-19 LAB — RESP PANEL BY RT-PCR (FLU A&B, COVID) ARPGX2
Influenza A by PCR: NEGATIVE
Influenza B by PCR: NEGATIVE
SARS Coronavirus 2 by RT PCR: NEGATIVE

## 2022-06-19 LAB — HIV ANTIBODY (ROUTINE TESTING W REFLEX): HIV Screen 4th Generation wRfx: NONREACTIVE

## 2022-06-19 LAB — PROTIME-INR
INR: 1 (ref 0.8–1.2)
Prothrombin Time: 13.2 seconds (ref 11.4–15.2)

## 2022-06-19 LAB — CBG MONITORING, ED: Glucose-Capillary: 102 mg/dL — ABNORMAL HIGH (ref 70–99)

## 2022-06-19 LAB — ETHANOL: Alcohol, Ethyl (B): <10 mg/dL (ref <10)

## 2022-06-19 MED ORDER — ACETAMINOPHEN 650 MG RE SUPP
650.0000 mg | RECTAL | Status: DC | PRN
  Administered 2022-06-20: 650 mg via RECTAL
  Filled 2022-06-19: qty 1

## 2022-06-19 MED ORDER — ACETAMINOPHEN 325 MG PO TABS
650.0000 mg | ORAL_TABLET | ORAL | Status: DC | PRN
  Administered 2022-06-23: 650 mg via ORAL
  Filled 2022-06-19: qty 2

## 2022-06-19 MED ORDER — ACETAMINOPHEN 160 MG/5ML PO SOLN: 650.0000 mg | ORAL | Status: DC | PRN

## 2022-06-19 MED ORDER — SENNOSIDES-DOCUSATE SODIUM 8.6-50 MG PO TABS
1.0000 | ORAL_TABLET | Freq: Two times a day (BID) | ORAL | Status: DC
  Administered 2022-06-21 – 2022-06-26 (×10): 1 via ORAL
  Filled 2022-06-19 (×10): qty 1

## 2022-06-19 MED ORDER — LABETALOL HCL 5 MG/ML IV SOLN: 20.0000 mg | INTRAVENOUS | Status: DC | PRN

## 2022-06-19 MED ORDER — FLUOXETINE HCL 20 MG PO CAPS
20.0000 mg | ORAL_CAPSULE | Freq: Every day | ORAL | Status: DC
  Administered 2022-06-21 – 2022-06-26 (×6): 20 mg via ORAL
  Filled 2022-06-19 (×6): qty 1

## 2022-06-19 MED ORDER — STROKE: EARLY STAGES OF RECOVERY BOOK
Freq: Once | Status: AC
  Filled 2022-06-19: qty 1

## 2022-06-19 MED ORDER — CLEVIDIPINE BUTYRATE 0.5 MG/ML IV EMUL
0.0000 mg/h | INTRAVENOUS | Status: DC
  Administered 2022-06-19: 1 mg/h via INTRAVENOUS
  Filled 2022-06-19 (×2): qty 50

## 2022-06-19 MED ORDER — CLEVIDIPINE BUTYRATE 0.5 MG/ML IV EMUL
0.0000 mg/h | INTRAVENOUS | Status: DC
  Administered 2022-06-19 (×2): 9 mg/h via INTRAVENOUS
  Administered 2022-06-19: 6 mg/h via INTRAVENOUS
  Administered 2022-06-19: 9 mg/h via INTRAVENOUS
  Administered 2022-06-20: 8 mg/h via INTRAVENOUS
  Administered 2022-06-20 (×2): 9 mg/h via INTRAVENOUS
  Administered 2022-06-20: 6 mg/h via INTRAVENOUS
  Filled 2022-06-19: qty 50
  Filled 2022-06-19: qty 100
  Filled 2022-06-19: qty 50
  Filled 2022-06-19: qty 100
  Filled 2022-06-19: qty 50
  Filled 2022-06-19: qty 100

## 2022-06-19 MED ORDER — AMLODIPINE BESYLATE 10 MG PO TABS
10.0000 mg | ORAL_TABLET | Freq: Every day | ORAL | Status: DC
  Administered 2022-06-21 – 2022-06-26 (×6): 10 mg via ORAL
  Filled 2022-06-19 (×6): qty 1

## 2022-06-19 MED ORDER — PANTOPRAZOLE SODIUM 40 MG IV SOLR
40.0000 mg | Freq: Every day | INTRAVENOUS | Status: DC
  Administered 2022-06-19 – 2022-06-20 (×2): 40 mg via INTRAVENOUS
  Filled 2022-06-19 (×2): qty 10

## 2022-06-19 NOTE — Progress Notes (Signed)
CODE STROKE- PHARMACY COMMUNICATION  Time CODE STROKE called/page received: 0845  Time response to CODE STROKE was made (in person or via phone): Immediately  Time Stroke Kit retrieved from Pyxis: N/A, not a candidate  Name of Provider/Nurse contacted: Dr. Rickard Patience 06/19/2022  8:58 AM

## 2022-06-19 NOTE — ED Notes (Signed)
Briell and boyfriend Mo at bedside and consent to transfer.

## 2022-06-19 NOTE — H&P (Addendum)
Neurology History and Physical  Reason for Consult: ICH transfer from [facility] Referring Physician: Dr. Leonel Ramsay  CC: Difficulty speaking  History is obtained from: Patient, Chart  HPI: [name] is a 66 y.o. female with a PMH of hypertension, multiple previous strokes currently on ASA and Plavix, migraines, MI, CAD, CKD, HLD, and cardiomyopathy presenting with difficulty speaking. She has had numerous strokes in the past both embolic and hemorrhagic. She did have a Zio patch after her last stroke which was negative for atrial fibrillation and she does follow with Dr. Quentin Ore and Christell Faith with [facility] (most recently seen 03/11/2022). She woke up on the morning of 06/19/2022 unable to speak and EMS was called. She went to bed in her usual state of health. She denies missing medications. Of note, she does take ASA, Plavix, rosuvastatin, furosemide, Norvasc, and Toprol-XL. Cleviprex used at [facility] for BP management, continued at [facility].    Previous Strokes:  08/2013 right basal ganglia infarcts 04/10/2021 right thalamic ICH MRI on 07/01/2021 showed bilateral hemispheric acute/subacute infarcts left cerebral and right corona radiata  MRI on 5/39/7673 embolic infarcts in left centrum semiovale and temporal lobe TEE on 07/31/2021 showed no intracardiac clot. EF 60-65%   LKW: 9pm 06/18/2022  IV thrombolysis given?: no, ICH Premorbid modified Rankin scale (mRS): 1 1-No significant post stroke disability and can perform usual duties with stroke symptoms ICH Score: 0   ROS: Unable to obtain due to altered mental status.   Past Medical History:  Diagnosis Date   Anxiety    Arthritis    CAD (coronary artery disease)    a. 03/2013 NSTEMI/Cath: 100 RCA, EF 45%->Med Rx;  b. 12/2013 Cath/PCI: LAD 28m 30d, D1 80, D2 60, LCX nl, RCA 50ost/p, 667m95d (2.5x33 Xience DES);  c. 05/2014 Lexi MV: EF 68%, no ischemia; d. stress echo 12/2015 no ischemia @ max exercise (did not achieve target)   Carot[place] disease,  [place]   a. 08/2013 Carotid U/S: 1-39% bilat ICA stenosis.   Chronic kidney disease    Chronic kidney infections. Takes daily preventative.   Complication of anesthesia    CVA (cerebral vascular accident) ([facility]   a. 08/2013.   CV[place]ce[place] vascular accident) ([facility]   Decreased libido    Depression    GERD (gastroesophageal reflux disease)    History of 2019 novel coronavirus disease (COVID-19) 07/2019   History of recurrent UTIs    Hypercholesteremia    Hyperlipemia    Hypertension    Insomnia    Ischemic cardiomyopathy    a. 03/2013 Echo: EF 30-35%, mod dil LA, mod-sev MR, mod TR;  b. 08/2013 Echo EF 60-65%, mild AI.   Menopause    Migraines    Myocardial infarction (HCC)    PONV (postoperative nausea and vomiting)    If anesthsia is given slowly, pt will not throw up, if given quickly, pt will have nausea and vomiting.   Right lower quadrant pain    Syncope and collapse    Tobacco abuse    Vaginal atrophy      Family History  Problem Relation Age of Onset   Hypertension Mother    Stroke Mother    Hyperlipidemia Mother    Hypertension Sister    Hypertension Sister    Breast cancer Paternal Grandmother    Colon cancer Neg Hx    Ovarian cancer Neg Hx    Heart disease Neg Hx    Diabetes Neg Hx  Social History:   reports that she quit smoking about 9 years ago. Her smoking use included cigarettes. She has a 30.00 pack-year smoking history. She has never used smokeless tobacco. She reports that she does not currently use alcohol. She reports that she does not use drugs.  Medications  Current Facility-Administered Medications:    [START ON 06/20/2022]  stroke: early stages of recovery book, , Does not apply, Once, Janine Ores, NP   acetaminophen (TYLENOL) tablet 650 mg, 650 mg, Oral, Q4H PRN **OR** acetaminophen (TYLENOL) 160 MG/5ML solution 650 mg, 650 mg, Per Tube, Q4H PRN **OR** acetaminophen (TYLENOL) suppository 650 mg, 650 mg, Rectal, Q4H PRN,  Charlean Merl, [place], NP   pantoprazole (PROTONIX) injection 40 mg, 40 mg, Intravenous, QHS, Shafer, Devon, NP   senna-docusate (Senokot-S) tablet 1 tablet, 1 tablet, Oral, BID, Janine Ores, NP  Exam: Current vital signs: BP (!) 143/71 (BP Location: Left Arm)   Pulse 99   Temp 98.9 F (37.2 C) (Axillary)   Resp (!) 21   Wt 66.2 kg   SpO2 93%   BMI 26.69 kg/m  Vital signs in last 24 hours: Temp:  [97.7 F (36.5 C)-98.9 F (37.2 C)] 98.9 F (37.2 C) (08/11 1500) Pulse Rate:  [63-99] 99 (08/11 1500) Resp:  [13-21] 21 (08/11 1500) BP: (130-187)/(61-89) 143/71 (08/11 1500) SpO2:  [90 %-97 %] 93 % (08/11 1500) Weight:  [64 kg-66.2 kg] 66.2 kg (08/11 1500)  GENERAL: Awake, alert in NAD HEENT: - Normocephalic and atraumatic, dry mm, no LN++, no Thyromegally LUNGS - Clear to auscultation bilaterally with no wheezes CV - S1S2 RRR, no m/r/g, equal pulses bilaterally. ABDOMEN - Soft, nontender, nondistended with normoactive BS Ext: warm, well perfused, intact peripheral pulses   Physical Exam  Constitutional: Appears well-developed and well-nourished.   Cardiovascular: Normal rate and regular rhythm.  Respiratory: Effort normal, non-labored breathing  Neuro: Mental Status: Patient is awake, alert, oriented to person. Disoriented to place, time, and situation Patient speech is dysarthric No signs of neglect Expressive aphasia Cranial Nerves: II: Visual Fields are full. Pupils are equal, round, and reactive to light.   III,IV, VI: EOMI without diplopia.  V: Facial sensation is diminished on the right VII: Right facial weakness VIII: Hearing is intact to voice X: Palate elevates symmetrically XI: Shoulder shrug is symmetric. XII: Tongue protrudes slightly to the left.  Motor: Tone is normal. Bulk is normal. 5/5 strength was present in all four extremities.  Sensory: Hyperesthesia to temperature LEFT upper extremity. Intact to FT x 4. No extinction to DSS present.  Deep Tendon  Reflexes: 4+ patellar bilaterally 3+ brachioradialis bilaterally Plantars: Toes are downgoing bilaterally.  Cerebellar: Ataxia noted with FNF with right upper extremity and HKS with the right lower extremity  Slowed RAM bilaterally  Gait: Unable to assess   NIHSS 1a Level of Conscious.: 0 1b LOC Questions: 0 1c LOC Commands: 0 2 Best Gaze: 0 3 Visual: 0 4 Facial Palsy: 1 5a Motor Arm - left: 0 5b Motor Arm - Right: 1 6a Motor Leg - Left: 0 6b Motor Leg - Right: 1 7 Limb Ataxia: 2 8 Sensory: 0 9 Best Language: 1 10 Dysarthria: 1 11 Extinct. and Inatten.: 0 TOTAL: 7   Labs I have reviewed labs in epic and the results pertinent to this consultation are:  CBC    Component Value Date/Time   WBC 10.2 06/19/2022 0902   RBC 4.20 06/19/2022 0902   HGB 12.8 06/19/2022 0902   HGB 12.4 08/07/2016 1435  HCT 38.8 06/19/2022 0902   HCT 36.3 08/07/2016 1435   PLT 276 06/19/2022 0902   PLT 345 08/07/2016 1435   MCV 92.4 06/19/2022 0902   MCV 91 08/07/2016 1435   MCV 94 02/17/2015 2238   MCH 30.5 06/19/2022 0902   MCHC 33.0 06/19/2022 0902   RDW 13.2 06/19/2022 0902   RDW 13.4 08/07/2016 1435   RDW 13.8 02/17/2015 2238   LYMPHSABS 2.3 06/19/2022 0902   LYMPHSABS 2.0 08/07/2016 1435   LYMPHSABS 2.3 02/17/2015 2238   MONOABS 0.9 06/19/2022 0902   MONOABS 1.0 (H) 02/17/2015 2238   EOSABS 0.2 06/19/2022 0902   EOSABS 0.3 08/07/2016 1435   EOSABS 0.4 02/17/2015 2238   BASOSABS 0.0 06/19/2022 0902   BASOSABS 0.0 08/07/2016 1435   BASOSABS 0.1 02/17/2015 2238    CMP     Component Value Date/Time   NA 143 06/19/2022 0902   NA 140 09/29/2021 1150   NA 138 02/17/2015 2238   K 3.5 06/19/2022 0902   K 2.8 (L) 02/17/2015 2238   CL 110 06/19/2022 0902   CL 101 02/17/2015 2238   CO2 23 06/19/2022 0902   CO2 27 02/17/2015 2238   GLUCOSE 100 (H) 06/19/2022 0902   GLUCOSE 120 (H) 02/17/2015 2238   BUN 15 06/19/2022 0902   BUN 18 09/29/2021 1150   BUN 22 (H) 02/17/2015  2238   CREATININE 0.86 06/19/2022 0902   CREATININE 0.85 02/17/2015 2238   CALCIUM 9.3 06/19/2022 0902   CALCIUM 9.3 02/17/2015 2238   PROT 7.0 06/19/2022 0902   PROT 7.4 09/29/2021 1150   PROT 7.1 02/17/2015 2238   ALBUMIN 3.8 06/19/2022 0902   ALBUMIN 4.7 09/29/2021 1150   ALBUMIN 4.0 02/17/2015 2238   AST 25 06/19/2022 0902   AST 22 02/17/2015 2238   ALT 24 06/19/2022 0902   ALT 23 02/17/2015 2238   ALKPHOS 79 06/19/2022 0902   ALKPHOS 79 02/17/2015 2238   BILITOT 0.7 06/19/2022 0902   BILITOT 0.3 09/29/2021 1150   BILITOT 0.4 02/17/2015 2238   GFRNONAA >60 06/19/2022 0902   GFRNONAA >60 02/17/2015 2238   GFRAA >60 12/27/2017 1140   GFRAA >60 02/17/2015 2238    Lipid Panel     Component Value Date/Time   CHOL 174 09/29/2021 1150   CHOL 192 05/15/2014 0500   TRIG 123 09/29/2021 1150   TRIG 155 05/15/2014 0500   HDL 62 09/29/2021 1150   HDL 51 05/15/2014 0500   CHOLHDL 2.8 09/29/2021 1150   CHOLHDL 4.6 07/01/2021 0636   VLDL 41 (H) 07/01/2021 0636   VLDL 31 05/15/2014 0500   LDLCALC 90 09/29/2021 1150   LDLCALC 110 (H) 05/15/2014 0500   LDLDIRECT 94 09/29/2021 1150     Imaging I have reviewed the images obtained:  CT-head- Acute left thalamic hemorrhage with surrounding edema and minor mass effect. No intraventricular extension  Assessment: 66 year old female with a significant cardiac and stroke history presenting with a left thalamic hemorrhage, likely hypertensive in etiology.   Acuity: Acute Laterality: Left Thalamic  Current suspected etiology:   Hypertensive Treatment: -Admit to ICU -ICH Score: 0 -ICH Volume:  -BP control goal SYS< 150 -PT/OT/ST  -neuromonitoring - No antiplatelet medications or anticoagulants. DVT prophylaxis with SCDs  CNS Cerebral edema with minor mass effect/ Brain compression -Close neuro monitoring -MRI ordered -Repeat CT scan ordered  Dysarthria Dysphagia following ICH  -NPO until cleared by  speech -ST  Encephalopathy -Correct metabolic causes -Monitor -Continue PT/OT/ST  RESP Monitor for respiratory decline Currently on RA  CV Essential (primary) hypertension -Aggressive BP control, goal SBP < 150 - IV Cleviprex ordered -TTE -Resume beta blocker and calcium channel blocker when able to tolerate PO medications  GI/GU - Gentle hydration - IVF until able to tolerate PO fluids - Daily BMP  HEME - CBC daily -Monitor -transfuse for hgb < 7  ENDO - SSI - CBG q4 -goal HgbA1c < 7  Fluid/Electrolyte Disorders -Replete -Repeat labs -Trend  ID -CXR -NPO -Monitor  Nutrition E66.9 Obesity   Prophylaxis DVT: SCDs GI: Protonix Bowel: Senna PRN  Dispo: Pending  Diet: NPO until cleared by speech  Code Status: Full Code    THE FOLLOWING WERE PRESENT ON ADMISSION: CNS -  Chronic Ischemic Stroke, Cerebral Edema/brain compression, ICH Cardiovascular- Hypertensive  Urgency  40 minutes spent in the neurological evaluation and management of this critically ill patient.   I have seen and examined the patient. I have reviewed the assessment and plan with the Neurology NP. 66 year old female with a significant cardiac and stroke history presenting with a left thalamic hemorrhage, likely hypertensive in etiology. Exam reveals findings referable to the current acute ICH as well as a remote right thalamic hemorrhage. Diagnostic and treatment plan as documented above.  Electronically signed: Dr. Kerney Elbe

## 2022-06-19 NOTE — ED Provider Notes (Signed)
[facility] Provider Note    Event Date/Time   First MD Initiated Contact with Patient 06/19/22 [phone]     (approximate)   History   No chief complaint on file.   HPI  [name] is a 66 y.o. female who woke up and could not speak this morning.  EMS was called.  Patient has had 3 previous strokes currently 1 of these involved her speaking ability but she recovered.  Patient can barely get any words out now and they are very hard to understand.  She has no other focal deficits that we can find she was seen with Dr. Leonel Ramsay neurology      Physical Exam   Triage Vital Signs: ED Triage Vitals  Enc Vitals Group     BP      Pulse      Resp      Temp      Temp src      SpO2      Weight      Height      Head Circumference      Peak Flow      Pain Score      Pain Loc      Pain Edu?      Excl. in [place]?     Most recent vital signs: Vitals:   06/19/22 0900  BP: (!) 184/84  Pulse: 64  Resp: 15  Temp: 98.3 F (36.8 C)  SpO2: 94%     General: Awake, unable to speak CV:  Good peripheral perfusion.  Regular rate and rhythm no audible murmurs Resp:  Normal effort.  Lungs are clear Abd:  No distention.  Abdomen soft and nontender Cranial nerves II through XII appear to be intact although she cannot speak.  Visual fields were not checked by more than confrontation. Motor strength is 5/5 throughout patient does not report any numbness   ED Results / Procedures / Treatments   Labs (all labs ordered are listed, but only abnormal results are displayed) Labs Reviewed  RESP PANEL BY RT-PCR (FLU A&B, COVID) ARPGX2  RESP PANEL BY RT-PCR (FLU A&B, COVID) ARPGX2  ETHANOL  PROTIME-INR  APTT  CBC  DIFFERENTIAL  COMPREHENSIVE METABOLIC PANEL  URINE DRUG SCREEN, QUALITATIVE ([facility] ONLY)  URINALYSIS, ROUTINE W REFLEX MICROSCOPIC  URINALYSIS, ROUTINE W REFLEX MICROSCOPIC     EKG  Pending   RADIOLOGY  Patient with intracranial bleed 1 x 2  cm measured by radiology. I reviewed and interpreted the film PROCEDURES:  Critical Care performed:   Procedures   MEDICATIONS ORDERED IN ED: Medications  clevidipine (CLEVIPREX) infusion 0.5 mg/mL (has no administration in time range)     IMPRESSION / MDM / ASSESSMENT AND PLAN / ED COURSE  I reviewed the triage vital signs and the nursing notes. Dr. Leonel Ramsay has ordered Cleviprex for the patient.  He will admit the patient to [facility] with Dr. Cheral Marker.  Differential diagnosis includes, but is not limited to, hypertensive intracranial bleed  Patient's presentation is most consistent with acute presentation with potential threat to life or bodily function.  The patient is on the cardiac monitor to evaluate for evidence of arrhythmia and/or significant heart rate changes.  FINAL CLINICAL IMPRESSION(S) / ED DIAGNOSES   Final diagnoses:  Intracranial hemorrhage ([place])     Rx / DC Orders   ED Discharge Orders     None        Note:  This document was prepared using  [employer] forces training and education officer and may include unintentional dictation errors.   Nena Polio, MD 06/19/22 [phone]

## 2022-06-19 NOTE — ED Notes (Signed)
Pt requests this nurse call her son to tell him she is here.

## 2022-06-19 NOTE — ED Notes (Signed)
Spoke with pts son and sister. Updated that pt is in the [place] ER with plans to transfer to [facility] in the next couple hours. Pts family states they will wait to visit her until she gets to [facility].

## 2022-06-19 NOTE — ED Notes (Signed)
Patient assigned bed 4N21

## 2022-06-19 NOTE — Progress Notes (Signed)
  Echocardiogram 2D Echocardiogram has been performed.  Merrie Roof F 06/19/2022, 5:50 PM

## 2022-06-19 NOTE — Progress Notes (Signed)
Pt arrived from 4N, paged Bruce Donath MD for orders.

## 2022-06-19 NOTE — ED Notes (Signed)
Pt incontinent of urine. Pt and bedding changed. Chucks pad, brief, and purwic placed on pt.

## 2022-06-19 NOTE — ED Notes (Signed)
EMTALA reviewed and all required documentation is updated. Pt is ready for transport.

## 2022-06-19 NOTE — ED Notes (Signed)
Pt taken to CT at this time.

## 2022-06-19 NOTE — ED Notes (Signed)
MD Neuro assessing pt at this time/ MD Malinda at bedside

## 2022-06-19 NOTE — ED Provider Notes (Signed)
Emergency Medicine Provider Triage Evaluation Note  [name] , a 66 y.o. female  was evaluated in triage.  Pt complains of new onset strokelike symptoms  Review of Systems  Positive: Cannot speak Negative: Good range of motion of arms face  Physical Exam  There were no vitals taken for this visit. Gen:   Awake, no distress   Resp:  Normal effort  MSK:   Moves extremities without difficulty legs not checked has were strapped down Inability to speak   Medical Decision Making  Medically screening exam initiated at 8:46 AM.  Appropriate orders placed.  [name] was informed that the remainder of the evaluation will be completed by another provider, this initial triage assessment does not replace that evaluation, and the importance of remaining in the ED until their evaluation is complete.     Nena Polio, MD 06/19/22 [phone]

## 2022-06-19 NOTE — ED Notes (Signed)
No answer from pts son, no voicemail set up.

## 2022-06-19 NOTE — Consult Note (Signed)
Neurology Consultation Reason for Consult: Difficulty speaking Referring Physician: Satira Anis  CC: Difficulty speaking  History is obtained from: Patient  HPI: [name] is a 66 y.o. female with a history of hypertension, multiple previous strokes who presents with difficulty speaking that was present on awakening.  Due to the symptoms, EMS was called and she was brought into the emergency department as a code stroke, but was outside the IV tenecteplase window.     LKW: 9 PM tpa given?: no, ICH ICH Score: Zero  Past Medical History:  Diagnosis Date   Anxiety    Arthritis    CAD (coronary artery disease)    a. 03/2013 NSTEMI/Cath: 100 RCA, EF 45%->Med Rx;  b. 12/2013 Cath/PCI: LAD 31m 30d, D1 80, D2 60, LCX nl, RCA 50ost/p, 685m95d (2.5x33 Xience DES);  c. 05/2014 Lexi MV: EF 68%, no ischemia; d. stress echo 12/2015 no ischemia @ max exercise (did not achieve target)   Carotid disease, bilateral ([facility]   a. 08/2013 Carotid U/S: 1-39% bilat ICA stenosis.   Chronic kidney disease    Chronic kidney infections. Takes daily preventative.   Complication of anesthesia    CV[place]re[place] vascular accident) ([place]   a. 08/2013.   [place]) [place]   Decreased libido    Depression    GERD (gastroesophageal reflux disease)    History of 2019 novel coronavirus disease (COVID-19) 07/2019   History of recurrent UTIs    Hypercholesteremia    Hyperlipemia    Hypertension    Insomnia    Ischemic cardiomyopathy    a. 03/2013 Echo: EF 30-35%, mod dil LA, mod-sev MR, mod TR;  b. 08/2013 Echo EF 60-65%, mild AI.   Menopause    Migraines    Myocardial infarction (HCC)    PONV (postoperative nausea and vomiting)    If anesthsia is given slowly, pt will not throw up, if given quickly, pt will have nausea and vomiting.   Right lower quadrant pain    Syncope and collapse    Tobacco abuse    Vaginal atrophy      Family History  Problem Relation Age of Onset    Hypertension Mother    Stroke Mother    Hyperlipidemia Mother    Hypertension Sister    Hypertension Sister    Breast cancer Paternal Grandmother    Colon cancer Neg Hx    Ovarian cancer Neg Hx    Heart disease Neg Hx    Diabetes Neg Hx      Social History:  reports that she quit smoking about 9 years ago. Her smoking use included cigarettes. She has a 30.00 pack-year smoking history. She has never used smokeless tobacco. She reports that she does not currently use alcohol. She reports that she does not use drugs.   Exam: Current vital signs: There were no vitals taken for this visit. Vital signs in last 24 hours:     Physical Exam  Constitutional: Appears well-developed and well-nourished.   Neuro: Mental Status: Patient is awake, alert, she is able to tell me the month and the year, able to say a few other words, but significant dysarthria and appears to have some degree of expressive aphasia. Cranial Nerves: II: She endorses seeing fingers wiggle in both hemifield's III,IV, VI: EOMI without ptosis or diploplia.  V: Facial sensation is diminished on the right VII: Facial movement with right facial weakness Motor: She only has mild drift of the right  arm and leg, but with significant discoordination out of proportion to weakness, able to hold both left arm and leg against drift without discoordination  sensory: Reports decreased sensation on the right  Cerebellar: Significant discoordination on both heel-knee-shin and finger-nose-finger on the right     I have reviewed labs in epic and the results pertinent to this consultation are: Platelets 276 INR 1.0 CBG 102  I have reviewed the images obtained: CT head-left thalamic hemorrhage  Impression: 66 year old female who presents with left thalamic hemorrhage.  My suspicion is that this is hypertensive in etiology and she will need to be admitted to the intensive care unit for aggressive antihypertensive therapy and  frequent monitoring.  Recommendations: 1) Admit to ICU 2) no antiplatelets or anticoagulants 3) blood pressure control with goal systolic [id number], I have started Cleviprex 4) Frequent neuro checks 5) If symptoms worsen or there is decreased mental status, repeat stat head CT 6) transfer to [facility], ICU  This patient is critically ill and at significant risk of neurological worsening, death and care requires constant monitoring of vital signs, hemodynamics,respiratory and cardiac monitoring, neurological assessment, discussion with family, other specialists and medical decision making of high complexity. I spent 60 minutes of neurocritical care time  in the care of  this patient. This was time spent independent of any time provided by nurse practitioner or PA.  Roland Rack, MD [facility] [phone]  If 7pm- 7am, please page neurology on call as listed in [place]. 06/19/2022  9:50 AM

## 2022-06-20 ENCOUNTER — Inpatient Hospital Stay (HOSPITAL_COMMUNITY): Payer: No Typology Code available for payment source

## 2022-06-20 DIAGNOSIS — I619 Nontraumatic intracerebral hemorrhage, unspecified: Secondary | ICD-10-CM | POA: Diagnosis not present

## 2022-06-20 DIAGNOSIS — I639 Cerebral infarction, unspecified: Secondary | ICD-10-CM | POA: Diagnosis not present

## 2022-06-20 DIAGNOSIS — G936 Cerebral edema: Secondary | ICD-10-CM | POA: Diagnosis not present

## 2022-06-20 DIAGNOSIS — I610 Nontraumatic intracerebral hemorrhage in hemisphere, subcortical: Secondary | ICD-10-CM | POA: Diagnosis not present

## 2022-06-20 DIAGNOSIS — G319 Degenerative disease of nervous system, unspecified: Secondary | ICD-10-CM | POA: Diagnosis not present

## 2022-06-20 LAB — LIPID PANEL
Cholesterol: 174 mg/dL (ref 0–200)
HDL: 58 mg/dL (ref >40)
LDL Cholesterol: 75 mg/dL (ref 0–99)
Total CHOL/HDL Ratio: 3 RATIO
Triglycerides: 206 mg/dL — ABNORMAL HIGH (ref <150)
VLDL: 41 mg/dL — ABNORMAL HIGH (ref 0–40)

## 2022-06-20 LAB — HEMOGLOBIN A1C
Hgb A1c MFr Bld: 6 % — ABNORMAL HIGH (ref 4.8–5.6)
Mean Plasma Glucose: 125.5 mg/dL

## 2022-06-20 MED ORDER — SODIUM CHLORIDE 0.9 % IV SOLN
INTRAVENOUS | Status: DC
Start: 2022-06-20 — End: 2022-06-22

## 2022-06-20 MED ORDER — HYDRALAZINE HCL 20 MG/ML IJ SOLN: 20.0000 mg | Freq: Four times a day (QID) | INTRAMUSCULAR | Status: DC | PRN

## 2022-06-20 MED ORDER — CHLORHEXIDINE GLUCONATE CLOTH 2 % EX PADS
6.0000 | MEDICATED_PAD | Freq: Every day | CUTANEOUS | Status: DC
Start: 2022-06-20 — End: 2022-06-23
  Administered 2022-06-20 – 2022-06-21 (×2): 6 via TOPICAL

## 2022-06-20 MED ORDER — HYDRALAZINE HCL 20 MG/ML IJ SOLN
20.0000 mg | Freq: Four times a day (QID) | INTRAMUSCULAR | Status: DC | PRN
  Administered 2022-06-20 – 2022-06-21 (×2): 20 mg via INTRAVENOUS
  Filled 2022-06-20 (×3): qty 1

## 2022-06-20 MED ORDER — LABETALOL HCL 5 MG/ML IV SOLN
20.0000 mg | INTRAVENOUS | Status: DC | PRN
  Administered 2022-06-20 – 2022-06-21 (×4): 20 mg via INTRAVENOUS
  Filled 2022-06-20 (×4): qty 4

## 2022-06-20 NOTE — Progress Notes (Addendum)
STROKE TEAM PROGRESS NOTE    INTERVAL HISTORY Patient brought in by EMS after waking up unable to speak.  Her sister is at the bedside on rounds. Bedside RN also present.  Today patient is alert and aphasic.  We discussed history, current symptoms, ongoing work up, diagnosis and plan of care. All of sister's questions were answered.   Patient had fluctuation in her speech and language and hence CT scan of the head was done this morning which showed stable appearance of left thalamic hemorrhage with mild cytotoxic edema but no significant mass effect or midline shift.  MRI scan also earlier this morning showed no significant change in the hemorrhage and changes of chronic small vessel disease and generalized cortical atrophy. Vitals:   06/20/22 1315 06/20/22 1330 06/20/22 1345 06/20/22 1400  BP: 132/71 135/68 126/65 126/67  Pulse: 89 81 83 80  Resp: '18 16 17 15  '$ Temp:      TempSrc:      SpO2: 97% 97% 98% 99%  Weight:       CBC:  Recent Labs  Lab 06/19/22 0902  WBC 10.2  NEUTROABS 6.7  HGB 12.8  HCT 38.8  MCV 92.4  PLT 625   Basic Metabolic Panel:  Recent Labs  Lab 06/19/22 0902  NA 143  K 3.5  CL 110  CO2 23  GLUCOSE 100*  BUN 15  CREATININE 0.86  CALCIUM 9.3   Lipid Panel:  Recent Labs  Lab 06/20/22 0937  CHOL 174  TRIG 206*  HDL 58  CHOLHDL 3.0  VLDL 41*  LDLCALC 75   HgbA1c:  Recent Labs  Lab 06/20/22 0937  HGBA1C 6.0*   Urine Drug Screen:  Recent Labs  Lab 06/19/22 1030  LABOPIA NONE DETECTED  COCAINSCRNUR NONE DETECTED  LABBENZ NONE DETECTED  AMPHETMU NONE DETECTED  THCU NONE DETECTED  LABBARB NONE DETECTED    Alcohol Level  Recent Labs  Lab 06/19/22 0902  ETH <10    IMAGING past 24 hours CT HEAD WO CONTRAST (5MM)  Result Date: 06/20/2022 CLINICAL DATA:  66 year old female with left thalamic hemorrhage. EXAM: CT HEAD WITHOUT CONTRAST TECHNIQUE: Contiguous axial images were obtained from the base of the skull through the vertex  without intravenous contrast. RADIATION DOSE REDUCTION: This exam was performed according to the departmental dose-optimization program which includes automated exposure control, adjustment of the mA and/or kV according to patient size and/or use of iterative reconstruction technique. COMPARISON:  Brain MRI 0228 hours today.  Head CTs 06/19/2022. FINDINGS: Brain: Oval hyperdense hemorrhage in the left thalamus with surrounding edema. Blood products 13 x 19 x 16 mm (AP by transverse by CC), 2 mL and minimally increased from the presentation CT 0853 hours yesterday. Regional edema but no significant mass effect. No intraventricular or extra-axial extension of blood. No ventriculomegaly. Stable gray-white matter differentiation elsewhere. Vascular: Mild Calcified atherosclerosis at the skull base. Skull: No acute osseous abnormality identified. Sinuses/Orbits: Visualized paranasal sinuses and mastoids are stable and well aerated. Other: No acute orbit or scalp soft tissue finding. IMPRESSION: 1. Small 2 mL left thalamic hemorrhage, blood products minimally increased since 0853 hours yesterday. Regional edema but no significant mass effect or other complicating features. 2. No new intracranial abnormality. Electronically Signed   By: Genevie Ann M.D.   On: 06/20/2022 08:06   MR BRAIN WO CONTRAST  Result Date: 06/20/2022 CLINICAL DATA:  Follow-up examination for stroke. EXAM: MRI HEAD WITHOUT CONTRAST TECHNIQUE: Multiplanar, multiecho pulse sequences of the brain and surrounding  structures were obtained without intravenous contrast. COMPARISON:  Prior CT from 06/19/2022. FINDINGS: Brain: Examination mildly degraded by motion artifact. Generalized age-related cerebral atrophy. Patchy T2/FLAIR hyperintensity involving the periventricular deep white matter both cerebral hemispheres as well as the pons, consistent with chronic small vessel ischemic disease, moderately advanced in nature. Multiple remote lacunar infarcts  present about the bilateral basal ganglia and hemispheric cerebral white matter. Remote hemorrhagic lacunar infarct involving the right thalamus noted. Previously identified intraparenchymal hemorrhage centered at the left thalamus again seen, relatively similar in size and morphology measuring approximately 1.6 x 1.3 x 1.3 cm (estimated volume 1.4 mL). Surrounding vasogenic edema with trace localized left-to-right shift. No intraventricular extension. No visible underlying lesion seen at this location. No intraventricular extension. No other acute intracranial hemorrhage elsewhere within the brain. Few additional scattered chronic micro hemorrhages noted, likely related to chronic poorly controlled hypertension. No other evidence for acute or subacute ischemia. No areas of chronic cortical infarction. No mass lesion. Ventricles normal size without hydrocephalus. No extra-axial fluid collection. Pituitary gland and suprasellar region within normal limits. Vascular: Major intracranial vascular flow voids are maintained. Skull and upper cervical spine: Craniocervical junction within normal limits. Bone marrow signal intensity normal. No scalp soft tissue abnormality. Sinuses/Orbits: Globes orbital soft tissues demonstrate no acute finding. Mild scattered mucosal thickening about the ethmoidal air cells. Paranasal sinuses are otherwise largely clear. No significant mastoid effusion. Other: None. IMPRESSION: 1. No significant interval change in size and morphology of acute intraparenchymal hemorrhage centered at the left thalamus, estimated volume 1.4 mL. Surrounding vasogenic edema with trace localized left-to-right shift. No intraventricular extension. No visible underlying lesion at this location. 2. No other acute intracranial abnormality. 3. Age-related cerebral atrophy with moderate chronic small vessel ischemic disease, with multiple remote lacunar infarcts about the hemispheric cerebral white matter and deep  gray nuclei. Electronically Signed   By: Jeannine Boga M.D.   On: 06/20/2022 04:44   CT HEAD WO CONTRAST (5MM)  Result Date: 06/19/2022 CLINICAL DATA:  Stroke follow-up EXAM: CT HEAD WITHOUT CONTRAST TECHNIQUE: Contiguous axial images were obtained from the base of the skull through the vertex without intravenous contrast. RADIATION DOSE REDUCTION: This exam was performed according to the departmental dose-optimization program which includes automated exposure control, adjustment of the mA and/or kV according to patient size and/or use of iterative reconstruction technique. COMPARISON:  CT head earlier today FINDINGS: Brain: Redemonstrated acute parenchymal hemorrhage involving the left thalamus. This is unchanged in size using similar measuring technique and again measures 1.2 x 1.7 x 1.3 cm. Similar surrounding edema with partial effacement of the third ventricle. Ventricular caliber is otherwise unchanged in size. No evidence of new hemorrhage or acute infarct. Vascular: No hyperdense vessel or unexpected calcification. Skull: Normal. Negative for fracture or focal lesion. Sinuses/Orbits: No acute finding. Other: None. IMPRESSION: Unchanged acute left thalamic hemorrhage with surrounding edema and minor mass effect. No intraventricular extension. Electronically Signed   By: Placido Sou M.D.   On: 06/19/2022 20:00   ECHOCARDIOGRAM COMPLETE  Result Date: 06/19/2022    ECHOCARDIOGRAM REPORT   Patient Name:   [name] Date of Exam: 06/19/2022 Medical Rec #:  [id number]     Height:       62.0 in Accession #:    [id number]    Weight:       145.9 lb Date of Birth:  [birth year]     BSA:          1.672 m Patient Age:  66 years      BP:           137/72 mmHg Patient Gender: F             HR:           99 bpm. Exam Location:  Inpatient Procedure: 2D Echo, Cardiac Doppler and Color Doppler Indications:    Stroke  History:        Patient has prior history of Echocardiogram examinations, most                  recent 03/12/2022. CHF, CAD, Stroke; Risk Factors:Former Smoker,                 Hypertension and Dyslipidemia.  Sonographer:    Merrie Roof RDCS Referring Phys: [id number] [place]  1. Left ventricular ejection fraction, by estimation, is 65 to 70%. The left ventricle has normal function. The left ventricle has no regional wall motion abnormalities. There is mild concentric left ventricular hypertrophy. Left ventricular diastolic parameters are consistent with Grade I diastolic dysfunction (impaired relaxation).  2. Right ventricular systolic function is normal. The right ventricular size is normal. Tricuspid regurgitation signal is inadequate for assessing PA pressure.  3. The mitral valve is normal in structure. No evidence of mitral valve regurgitation.  4. The aortic valve is tricuspid. There is mild calcification of the aortic valve. There is mild thickening of the aortic valve. Aortic valve regurgitation is mild. Aortic valve sclerosis/calcification is present, without any evidence of aortic stenosis.  5. The inferior vena cava is normal in size with greater than 50% respiratory variability, suggesting right atrial pressure of 3 mmHg. Comparison(s): No significant change from prior study. Conclusion(s)/Recommendation(s): No intracardiac source of embolism detected on this transthoracic study. Consider a transesophageal echocardiogram to exclude cardiac source of embolism if clinically indicated. FINDINGS  Left Ventricle: Left ventricular ejection fraction, by estimation, is 65 to 70%. The left ventricle has normal function. The left ventricle has no regional wall motion abnormalities. The left ventricular internal cavity size was normal in size. There is  mild concentric left ventricular hypertrophy. Left ventricular diastolic parameters are consistent with Grade I diastolic dysfunction (impaired relaxation). Right Ventricle: The right ventricular size is normal. No increase in right  ventricular wall thickness. Right ventricular systolic function is normal. Tricuspid regurgitation signal is inadequate for assessing PA pressure. Left Atrium: Left atrial size was normal in size. Right Atrium: Right atrial size was normal in size. Pericardium: There is no evidence of pericardial effusion. Mitral Valve: The mitral valve is normal in structure. No evidence of mitral valve regurgitation. Tricuspid Valve: The tricuspid valve is normal in structure. Tricuspid valve regurgitation is trivial. Aortic Valve: The aortic valve is tricuspid. There is mild calcification of the aortic valve. There is mild thickening of the aortic valve. Aortic valve regurgitation is mild. Aortic valve sclerosis/calcification is present, without any evidence of aortic stenosis. Pulmonic Valve: The pulmonic valve was not well visualized. Pulmonic valve regurgitation is trivial. Aorta: The aortic root and ascending aorta are structurally normal, with no evidence of dilitation. Venous: The inferior vena cava is normal in size with greater than 50% respiratory variability, suggesting right atrial pressure of 3 mmHg. IAS/Shunts: The atrial septum is grossly normal.  LEFT VENTRICLE PLAX 2D LVIDd:         4.20 cm   Diastology LVIDs:         3.00 cm   LV e' medial:    7.18  cm/s LV PW:         1.30 cm   LV E/e' medial:  9.1 LV IVS:        1.40 cm   LV e' lateral:   6.74 cm/s LVOT diam:     2.10 cm   LV E/e' lateral: 9.7 LV SV:         64 LV SV Index:   38 LVOT Area:     3.46 cm  RIGHT VENTRICLE RV Basal diam:  2.60 cm LEFT ATRIUM             Index        RIGHT ATRIUM           Index LA diam:        3.50 cm 2.09 cm/m   RA Area:     13.30 cm LA Vol (A2C):   44.2 ml 26.43 ml/m  RA Volume:   29.60 ml  17.70 ml/m LA Vol (A4C):   41.1 ml 24.58 ml/m LA Biplane Vol: 46.2 ml 27.63 ml/m  AORTIC VALVE LVOT Vmax:   125.00 cm/s LVOT Vmean:  82.300 cm/s LVOT VTI:    0.184 m  AORTA Ao Root diam: 3.10 cm Ao Asc diam:  3.20 cm MITRAL VALVE MV Area  (PHT): 5.02 cm     SHUNTS MV Decel Time: 151 msec     Systemic VTI:  0.18 m MV E velocity: 65.20 cm/s   Systemic Diam: 2.10 cm MV A velocity: 108.00 cm/s MV E/A ratio:  0.60 Gwyndolyn Kaufman MD Electronically signed by Gwyndolyn Kaufman MD Signature Date/Time: 06/19/2022/6:14:05 PM    Final     PHYSICAL EXAM  Temp:  [98.4 F (36.9 C)-100.1 F (37.8 C)] 99 F (37.2 C) (08/12 1200) Pulse Rate:  [76-115] 80 (08/12 1400) Resp:  [13-32] 15 (08/12 1400) BP: (96-176)/(65-107) 126/67 (08/12 1400) SpO2:  [89 %-100 %] 99 % (08/12 1400) Weight:  [66.2 kg] 66.2 kg (08/11 1500)  General - Well nourished, well developed middle-aged lady, in no apparent distress.  Cardiovascular - Regular rhythm and rate.  Mental Status -  Alert, eyes open tracking examiner and able to focus as well. Oriented to person, place.  Expressive> receptive aphasia.  Speaks only a few words and is nonfluent not able to speak sentences.  Able to follow some one step commands.  Cranial Nerves II - XII - II - Visual field intact OU. III, IV, VI - Extraocular movements intact. V - Facial sensation intact bilaterally. VII - Facial movement intact bilaterally. VIII - Hearing & vestibular intact bilaterally. X - Palate elevates symmetrically. XI - Chin turning & shoulder shrug intact bilaterally. XII - Tongue protrusion intact.  Motor Strength -  RUE proximal weakness with good grip strength. RLE weakness present. Left UE and LE at least antigravity.   Motor Tone - Muscle tone was assessed at the neck and appendages and was normal.  Sensory - Light touch intact and symmetrical.    Coordination - Timpaired coordination RUE, unable to perform RAMs or finger to thumb  Gait and Station - deferred.   ASSESSMENT/PLAN MERITA HAWKS is a 66 y.o. female with a PMH of hypertension, multiple previous strokes currently on ASA and Plavix, migraines, MI, CAD, CKD, HLD, and cardiomyopathy presenting with difficulty speaking. She has  had numerous strokes in the past both embolic and hemorrhagic. She did have a Zio patch after her last stroke which was negative for atrial fibrillation and she does follow with Dr.  Quentin Ore and Christell Faith with [facility] (most recently seen 03/11/2022). She woke up on the morning of 06/19/2022 unable to speak and EMS was called. She went to bed in her usual state of health. She denies missing medications. Of note, she does take ASA, Plavix, rosuvastatin, furosemide, Norvasc, and Toprol-XL. Cleviprex used at [facility] for BP management, continued at [facility].   Stroke: Acute left thalamic hemorrhage while on DAPT with plavix and ASA for stroke prevention which was likely hypertensive in etiology.  Code Stroke CT head Acute left thalamic hemorrhage with surrounding edema and minor mass effect. No intraventricular extension Repeat Head CT 8/11 Small 2 mL left thalamic hemorrhage, blood products minimally increased since 0853 hours yesterday. Regional edema but no significant mass effect or other complicating features. No new intracranial abnormality. CTA head & neck PENDING on 8/13 MRI   No significant interval change in size and morphology of acute intraparenchymal hemorrhage centered at the left thalamus, estimated volume 1.4 mL. Surrounding vasogenic edema with trace localized left-to-right shift. No intraventricular extension. No visible underlying lesion at this location.No other acute intracranial abnormality. Age-related cerebral atrophy with moderate chronic small vessel ischemic disease, with multiple remote lacunar infarcts about the hemispheric cerebral white matter and deep gray nuclei. 2D Echo EF 65-70%, Mild LVH, Grade 1 diastolic dysfunction, No thrombus or shunt detected LDL 90 HgbA1c 6.2 VTE prophylaxis - SCDs    Diet   Diet NPO time specified   On ASA 81 mg and Plavix '75mg'$  prior to admission No antiplatelet or anticoagulant medications Therapy recommendations:  PENDING Disposition:   TBD  Hypertension Home meds:  Metoprolol, Amlodipine,  Unstable, currently requiring cleviprex drip with labetolol and hydralazine prn as she is currently NPO without access for po meds. Wean cleviprex as tolerated.  BP goal 130-150   Hyperlipidemia Home meds:  resumed in hospital LDL 90, goal < 70 High intensity statin not indicated  Continue statin at discharge  Diabetes type II Uncontrolled Home meds:   HgbA1c 6.2, goal < 7.0 CBGs Recent Labs    06/19/22 0859  GLUCAP 102*   SSI  Dysphagia NPO per SLP recommendations On NS '@75cc'$ /hr Monitor BMP  Other Stroke Risk Factors Advanced Age >/= 2  Former Cigarette smoker e as appropriate  Hx stroke Family hx stroke (mother) Coronary artery disease Migraines  Hospital day # 1 This patient was seen and evaluated with Dr. Leonie Man. He directed the plan of care.  Charlene Brooke, NP-C    STROKE MD NOTE :  I have personally obtained history,examined this patient, reviewed notes, independently viewed imaging studies, participated in medical decision making and plan of care.ROS completed by me personally and pertinent positives fully documented  I have made any additions or clarifications directly to the above note. Agree with note above.  Patient presented with small left thalamic hemorrhage due to hypertension.  Continue strict blood pressure control with systolic goal below 675.  Use as needed IV labetalol and hydralazine.  Speech therapy for swallow eval and resume home medications when able to swallow safely.  Wean off Cleviprex drip as tolerated.Starleen Blue out of bed.  Therapy consults.  Check CT angiogram of the brain tomorrow morning and if stable will consider transfer out of the ICU.  Discussed with patient and sister at the bedside and answered questions. This patient is critically ill and at significant risk of neurological worsening, death and care requires constant monitoring of vital signs, hemodynamics,respiratory  and cardiac monitoring, extensive review of multiple databases,  frequent neurological assessment, discussion with family, other specialists and medical decision making of high complexity.I have made any additions or clarifications directly to the above note.This critical care time does not reflect procedure time, or teaching time or supervisory time of PA/NP/Med Resident etc but could involve care discussion time.  I spent 30 minutes of neurocritical care time  in the care of  this patient.     Antony Contras, MD Medical Director [facility] Pager: [phone] 06/20/2022 4:54 PM  To contact Stroke Continuity provider, please refer to [email or web address] After hours, contact General Neurology

## 2022-06-20 NOTE — Evaluation (Signed)
Clinical/Bedside Swallow Evaluation Patient Details  Name: [name] MRN: [id number] Date of Birth: 03-30-1956  Today's Date: 06/20/2022 Time: SLP Start Time (ACUTE ONLY): 0915 SLP Stop Time (ACUTE ONLY): 0935 SLP Time Calculation (min) (ACUTE ONLY): 20 min  Past Medical History:  Past Medical History:  Diagnosis Date   Anxiety    Arthritis    CAD (coronary artery disease)    a. 03/2013 NSTEMI/Cath: 100 RCA, EF 45%->Med Rx;  b. 12/2013 Cath/PCI: LAD 29m 30d, D1 80, D2 60, LCX nl, RCA 50ost/p, 645m95d (2.5x33 Xience DES);  c. 05/2014 Lexi MV: EF 68%, no ischemia; d. stress echo 12/2015 no ischemia @ max exercise (did not achieve target)   Carotid disease, bilateral ([facility]   a. 08/2013 Carotid U/S: 1-39% bilat ICA stenosis.   Chronic kidney disease    Chronic kidney infections. Takes daily preventative.   Complication of anesthesia    CVA (cerebral vascular accident) ([facility]   a. 08/2013.   CV[place] (cerebral vascular accident) ([facility]   Decreased libido    Depression    GERD (gastroesophageal reflux disease)    History of 2019 novel coronavirus disease (COVID-19) 07/2019   History of recurrent UTIs    Hypercholesteremia    Hyperlipemia    Hypertension    Insomnia    Ischemic cardiomyopathy    a. 03/2013 Echo: EF 30-35%, mod dil LA, mod-sev MR, mod TR;  b. 08/2013 Echo EF 60-65%, mild AI.   Menopause    Migraines    Myocardial infarction (HCC)    PONV (postoperative nausea and vomiting)    If anesthsia is given slowly, pt will not throw up, if given quickly, pt will have nausea and vomiting.   Right lower quadrant pain    Syncope and collapse    Tobacco abuse    Vaginal atrophy    Past Surgical History:  Past Surgical History:  Procedure Laterality Date   CARDIAC CATHETERIZATION  01/01/2014   CARDIAC CATHETERIZATION  03/16/2013   CARDIAC CATHETERIZATION  12/2013   CARDIAC CATHETERIZATION N/A 08/19/2016   Procedure: Left Heart Cath and Coronary Angiography;  Surgeon: TiMinna MerrittsMD;  [facility];  Service: Cardiovascular;  Laterality: N/A;   CESAREAN SECTION WITH BILATERAL TUBAL LIGATION     CORONARY ANGIOPLASTY WITH STENT PLACEMENT  01/01/2014   95% lesion with a drug eluting stent to the distal RCA.   ENDOMETRIAL ABLATION     KNEE ARTHROSCOPY  11/27/2006   left knee    OOPHORECTOMY     TEE WITHOUT CARDIOVERSION N/A 07/25/2021   Procedure: TRANSESOPHAGEAL ECHOCARDIOGRAM (TEE);  Surgeon: GoMinna MerrittsMD;  Location: [facility];  Service: Cardiovascular;  Laterality: N/A;   TOTAL KNEE ARTHROPLASTY Left 10/06/2016   Procedure: TOTAL KNEE ARTHROPLASTY;  Surgeon: JoCorky MullMD;  Location: [facility];  Service: Orthopedics;  Laterality: Left;   TOTAL KNEE ARTHROPLASTY Right 11/12/2020   Procedure: TOTAL KNEE ARTHROPLASTY;  Surgeon: PoCorky MullMD;  Location: [facility];  Service: Orthopedics;  Laterality: Right;   TUBAL LIGATION     HPI:  [age][facility] - Pt PMH + for CVAs, tobaccco use, HTN, GERD.  She underwent brain imaging on 8/12 am - due to worsening dysphasia showing No significant interval change in size and morphology of acute  intraparenchymal hemorrhage centered at the left thalamus, estimated  volume 1.4 mL. Surrounding vasogenic edema with trace localized  left-to-right shift.  Pt's  sister, Con Memos, present reports she spent a week with her a few weeks ago and she was fine at that time.  Con Memos denies pt having dysphagia PTA.    Assessment / Plan / Recommendation  Clinical Impression  Pt currently alert but aphasic - and nearly dysphonic.   Lingual protrusion is midline but with decr ROM and she has a severe laceration/ulceration on right upper lip- inner and outer.  Pt followed directions but phonation was not adequate - nearly whisper when she did phonate.  And she has difficulty with more complex language tasks.  Administration of ice chips x3, water via tsp and straw as well as icecream bolus x3 completed with pt  providing hand over hand assist with first bolus.  Multiple delayed swallows noted across all consistencies - concerning for potential oropharyngeal retention (although pt denies).  Explosive coughing with erthyemic face noted with 3 ounce Yale- raising concern for silent aspiration risk.  Recommend continue NPO today and re-evaluate pt tomorrow for potential po readiness.  Pt and sister, Con Memos, informed and agreeable to plan.   Given acute worsening in language, will defer SLE to allow pt more time for spontaneous recovery. SLP Visit Diagnosis: Dysphagia, unspecified (R13.10);Dysphagia, oropharyngeal phase (R13.12)    Aspiration Risk  Severe aspiration risk;Moderate aspiration risk;Risk for inadequate nutrition/hydration    Diet Recommendation NPO   Medication Administration: Via alternative means    Other  Recommendations Oral Care Recommendations: Oral care QID    Recommendations for follow up therapy are one component of a multi-disciplinary discharge planning process, led by the attending physician.  Recommendations may be updated based on patient status, additional functional criteria and insurance authorization.  Follow up Recommendations Acute inpatient rehab (3hours/day)      Assistance Recommended at Discharge Frequent or constant Supervision/Assistance  Functional Status Assessment Patient has had a recent decline in their functional status and demonstrates the ability to make significant improvements in function in a reasonable and predictable amount of time.  Frequency and Duration min 2x/week  2 weeks       Prognosis Prognosis for Safe Diet Advancement: Good      Swallow Study   General Date of Onset: 06/20/22 HPI: 65 yo female adm to [facility] with aphasia - Pt PMH + for CVAs, tobaccco use, HTN, GERD.  She underwent brain imaging on 8/12 am - due to worsening dysphasia showing No significant interval change in size and morphology of acute  intraparenchymal hemorrhage centered  at the left thalamus, estimated  volume 1.4 mL. Surrounding vasogenic edema with trace localized  left-to-right shift.  Pt's sister, Con Memos, present reports she spent a week with her a few weeks ago and she was fine at that time.  Con Memos denies pt having dysphagia PTA. Type of Study: Bedside Swallow Evaluation Diet Prior to this Study: NPO Temperature Spikes Noted: No Respiratory Status: Nasal cannula Behavior/Cognition: Alert Oral Cavity Assessment: Other (comment) Oral Care Completed by SLP: Yes Oral Cavity - Dentition: Adequate natural dentition Vision: Impaired for self-feeding Self-Feeding Abilities: Total assist Patient Positioning: Upright in bed Baseline Vocal Quality: Low vocal intensity;Suspected CN X (Vagus) involvement Volitional Cough: Weak Volitional Swallow: Unable to elicit    Oral/Motor/Sensory Function Overall Oral Motor/Sensory Function: Mild impairment   Ice Chips Ice chips: Impaired Presentation: Spoon Oral Phase Impairments: Reduced labial seal Oral Phase Functional Implications: Prolonged oral transit Pharyngeal Phase Impairments: Suspected delayed Swallow;Multiple swallows   Thin Liquid Thin Liquid: Impaired Presentation: Spoon;Straw Oral Phase Impairments: Reduced labial seal Oral Phase  Functional Implications: Prolonged oral transit Pharyngeal  Phase Impairments: Suspected delayed Swallow;Cough - Immediate    Nectar Thick Nectar Thick Liquid: Not tested   Honey Thick Honey Thick Liquid: Not tested   Puree Puree: Impaired Presentation: Spoon Oral Phase Impairments: Reduced labial seal Oral Phase Functional Implications: Prolonged oral transit Pharyngeal Phase Impairments: Multiple swallows;Suspected delayed Swallow   Solid     Solid: Not tested      Macario Golds 06/20/2022,10:15 AM  Kathleen Lime, MS [facility] SLP [place] Office [phone] Pager [phone]

## 2022-06-20 NOTE — Progress Notes (Signed)
PT Cancellation Note  Patient Details Name: [name] MRN: [id number] DOB: 07-Sep-1956   Cancelled Treatment:    Reason Eval/Treat Not Completed: Patient declined, no reason specified. Bedrest orders lifted, but pt shaking head that she did not want to try to get EOB or OOB at this time, gesturing it was due to pain and lethargy. Educated pt on importance of early mobility, but pt continued to decline but was agreeable to try tomorrow. Will plan to follow-up tomorrow.   Moishe Spice, PT, DPT Acute Rehabilitation Services  Office: [place] 06/20/2022, 4:59 PM

## 2022-06-20 NOTE — Progress Notes (Addendum)
OT Cancellation Note  Patient Details Name: [name] MRN: [id number] DOB: 08-Aug-1956   Cancelled Treatment:    Reason Eval/Treat Not Completed: Active bedrest order, bed rest order lifted, but patient refusing OOB.  Ajooni Karam D Erial Fikes 06/20/2022, 8:21 AM 06/20/2022  RP, OTR/L  Acute Rehabilitation Services  Office:  [phone]

## 2022-06-20 NOTE — Progress Notes (Signed)
PT Cancellation Note  Patient Details Name: DORISANN SCHWANKE MRN: [id number] DOB: Jul 14, 1956   Cancelled Treatment:    Reason Eval/Treat Not Completed: Active bedrest order x24 hours starting at 15:23 on 06/19/22. Will plan to follow-up later this afternoon after the 24 hour period if time permits.  Moishe Spice, PT, DPT Acute Rehabilitation Services  Office: [place] 06/20/2022, 7:57 AM

## 2022-06-20 NOTE — Progress Notes (Addendum)
During shift change, this RN and dayshift RN went to assess pt together.  Pt non verbal and slow to follow commands.  Neuro change from 0600.  Dr. Cheral Marker notified.  STAT CTH ordered.

## 2022-06-21 ENCOUNTER — Inpatient Hospital Stay (HOSPITAL_COMMUNITY): Payer: No Typology Code available for payment source

## 2022-06-21 DIAGNOSIS — I63233 Cerebral infarction due to unspecified occlusion or stenosis of bilateral carotid arteries: Secondary | ICD-10-CM | POA: Diagnosis not present

## 2022-06-21 DIAGNOSIS — I619 Nontraumatic intracerebral hemorrhage, unspecified: Secondary | ICD-10-CM | POA: Diagnosis not present

## 2022-06-21 DIAGNOSIS — I6622 Occlusion and stenosis of left posterior cerebral artery: Secondary | ICD-10-CM | POA: Diagnosis not present

## 2022-06-21 DIAGNOSIS — I610 Nontraumatic intracerebral hemorrhage in hemisphere, subcortical: Secondary | ICD-10-CM | POA: Diagnosis not present

## 2022-06-21 DIAGNOSIS — I672 Cerebral atherosclerosis: Secondary | ICD-10-CM | POA: Diagnosis not present

## 2022-06-21 DIAGNOSIS — G936 Cerebral edema: Secondary | ICD-10-CM | POA: Diagnosis not present

## 2022-06-21 LAB — BASIC METABOLIC PANEL
Anion gap: 11 (ref 5–15)
BUN: 18 mg/dL (ref 8–23)
CO2: 24 mmol/L (ref 22–32)
Calcium: 9.4 mg/dL (ref 8.9–10.3)
Chloride: 108 mmol/L (ref 98–111)
Creatinine, Ser: 0.87 mg/dL (ref 0.44–1.00)
GFR, Estimated: >60 mL/min (ref >60)
Glucose, Bld: 104 mg/dL — ABNORMAL HIGH (ref 70–99)
Potassium: 3.3 mmol/L — ABNORMAL LOW (ref 3.5–5.1)
Sodium: 143 mmol/L (ref 135–145)

## 2022-06-21 LAB — CBC
HCT: 39 % (ref 36.0–46.0)
Hemoglobin: 13.3 g/dL (ref 12.0–15.0)
MCH: 31.3 pg (ref 26.0–34.0)
MCHC: 34.1 g/dL (ref 30.0–36.0)
MCV: 91.8 fL (ref 80.0–100.0)
Platelets: 270 10*3/uL (ref 150–400)
RBC: 4.25 MIL/uL (ref 3.87–5.11)
RDW: 13.7 % (ref 11.5–15.5)
WBC: 12.8 10*3/uL — ABNORMAL HIGH (ref 4.0–10.5)
nRBC: 0 % (ref 0.0–0.2)

## 2022-06-21 MED ORDER — METOPROLOL TARTRATE 25 MG PO TABS
25.0000 mg | ORAL_TABLET | Freq: Two times a day (BID) | ORAL | Status: DC
  Administered 2022-06-21 – 2022-06-26 (×10): 25 mg via ORAL
  Filled 2022-06-21 (×10): qty 1

## 2022-06-21 MED ORDER — PANTOPRAZOLE 2 MG/ML SUSPENSION
40.0000 mg | Freq: Every day | ORAL | Status: DC
  Administered 2022-06-21 – 2022-06-25 (×5): 40 mg via ORAL
  Filled 2022-06-21 (×5): qty 20

## 2022-06-21 MED ORDER — IOHEXOL 350 MG/ML SOLN
100.0000 mL | Freq: Once | INTRAVENOUS | Status: AC | PRN
  Administered 2022-06-21: 100 mL via INTRAVENOUS

## 2022-06-21 MED ORDER — POTASSIUM CHLORIDE 10 MEQ/100ML IV SOLN
10.0000 meq | INTRAVENOUS | Status: AC
  Administered 2022-06-21 (×4): 10 meq via INTRAVENOUS
  Filled 2022-06-21 (×4): qty 100

## 2022-06-21 MED ORDER — METOPROLOL SUCCINATE ER 50 MG PO TB24: 50.0000 mg | ORAL_TABLET | Freq: Every day | ORAL | Status: DC

## 2022-06-21 MED ORDER — HEPARIN SODIUM (PORCINE) 5000 UNIT/ML IJ SOLN
5000.0000 [IU] | Freq: Three times a day (TID) | INTRAMUSCULAR | Status: DC
  Administered 2022-06-21 – 2022-06-26 (×16): 5000 [IU] via SUBCUTANEOUS
  Filled 2022-06-21 (×16): qty 1

## 2022-06-21 MED ORDER — ASPIRIN 81 MG PO CHEW: 81.0000 mg | CHEWABLE_TABLET | Freq: Every day | ORAL | Status: DC

## 2022-06-21 MED ORDER — CLOPIDOGREL BISULFATE 75 MG PO TABS: 75.0000 mg | ORAL_TABLET | Freq: Every day | ORAL | Status: DC

## 2022-06-21 MED ORDER — ROSUVASTATIN CALCIUM 20 MG PO TABS
40.0000 mg | ORAL_TABLET | Freq: Every day | ORAL | Status: DC
Start: 2022-06-21 — End: 2022-06-26
  Administered 2022-06-21 – 2022-06-25 (×5): 40 mg via ORAL
  Filled 2022-06-21 (×5): qty 2

## 2022-06-21 NOTE — Evaluation (Signed)
Physical Therapy Evaluation Patient Details Name: [name] MRN: [id number] DOB: 1956-07-05 Today's Date: 06/21/2022  History of Present Illness  Pt is a 66 y.o. female who presented 06/19/22 with difficulty speaking. Pt found to have acute intraparenchymal hemorrhage centered at the left thalamus with trace localized left-to-right shift. PMH: HTN, numerous strokes in the past both embolic and hemorrhagic currently on ASA and Plavix, migraines, MI, CAD, CKD, HLD, and cardiomyopathy   Clinical Impression  Pt presents with condition above and deficits mentioned below, see PT Problem List. PTA, she was IND, having just progressed recently to ambulating without an AD and driving again. Pt lives alone in a 1-level apartment with a level entry and has support from her neighbors. Per her sister, her prior CVA affected her L side and she did fx her L UE when she fell 1x while in the SNF. Currently, pt demonstrates deficits in R-sided strength, coordination, and sensation along with deficits in balance, communication, cognition, and activity tolerance. She is requiring modA to transition supine > sit and to stand and take a couple steps to the recliner from the bed with UE support today. Pt is at risk for falls and has difficulty coordinating R foot placement when stepping and R knee extension during stance phase. Pt would greatly benefit from intensive therapy in the AIR setting. Will continue to follow acutely.      Recommendations for follow up therapy are one component of a multi-disciplinary discharge planning process, led by the attending physician.  Recommendations may be updated based on patient status, additional functional criteria and insurance authorization.  Follow Up Recommendations Acute inpatient rehab (3hours/day)      Assistance Recommended at Discharge Frequent or constant Supervision/Assistance  Patient can return home with the following  A lot of help with walking and/or transfers;A  lot of help with bathing/dressing/bathroom;Assistance with cooking/housework;Direct supervision/assist for medications management;Direct supervision/assist for financial management;Assist for transportation    Equipment Recommendations Rolling walker (2 wheels);BSC/3in1;Wheelchair (measurements PT);Wheelchair cushion (measurements PT)  Recommendations for Other Services  Rehab consult    Functional Status Assessment Patient has had a recent decline in their functional status and demonstrates the ability to make significant improvements in function in a reasonable and predictable amount of time.     Precautions / Restrictions Precautions Precautions: Fall Precaution Comments: SBP 130-150 Restrictions Weight Bearing Restrictions: No      Mobility  Bed Mobility Overal bed mobility: Needs Assistance Bed Mobility: Supine to Sit     Supine to sit: Mod assist, HOB elevated     General bed mobility comments: ModA to lift trunk and gain balance sitting EOB and to scoot hips to edge, good initiation noted to manage legs.    Transfers Overall transfer level: Needs assistance Equipment used: 1 person hand held assist Transfers: Sit to/from Stand, Bed to chair/wheelchair/BSC Sit to Stand: Mod assist   Step pivot transfers: Mod assist       General transfer comment: ModA to prevent posterior LOB due to her posterior tendency when coming to stand. ModA for stability, to weight shift, and to advance and help with placing R foot when taking a few steps to R bed > recliner with pt holding onto therapist.    Ambulation/Gait Ambulation/Gait assistance: Mod assist Gait Distance (Feet): 2 Feet Assistive device: 1 person hand held assist Gait Pattern/deviations: Step-to pattern, Decreased step length - right, Decreased stride length, Decreased dorsiflexion - right, Knee flexed in stance - right, Knees buckling Gait velocity: reduced Gait  velocity interpretation: <1.31 ft/sec, indicative of  household ambulator   General Gait Details: Pt with difficulty coordinating where to place R leg when stepping, thereby requiring physical assistance and multi-modal cues. Pt needing tactile cues for weight shifting laterally and occasional R knee block due to noted R knee buckling with stance. Pt tends to hold R foot off ground though.  Stairs            Wheelchair Mobility    Modified Rankin (Stroke Patients Only) Modified Rankin (Stroke Patients Only) Pre-Morbid Rankin Score: Slight disability Modified Rankin: Moderately severe disability     Balance Overall balance assessment: Needs assistance Sitting-balance support: Single extremity supported, Bilateral upper extremity supported, Feet supported Sitting balance-Leahy Scale: Poor Sitting balance - Comments: Pt with posterior and R lateral lean, needing repeated cues to correct with improved reactional strategies noted as time in sitting progressed. Pt varying from requiring min-modA initially to min guard-minA at the end. Postural control: Posterior lean, Right lateral lean Standing balance support: Single extremity supported, Bilateral upper extremity supported, During functional activity Standing balance-Leahy Scale: Poor Standing balance comment: Reliant on UE support and modA                             Pertinent Vitals/Pain Pain Assessment Pain Assessment: Faces Faces Pain Scale: Hurts little more Pain Location: generalized Pain Descriptors / Indicators: Grimacing Pain Intervention(s): Limited activity within patient's tolerance, Monitored during session, Repositioned    Home Living Family/patient expects to be discharged to:: Private residence Living Arrangements: Alone Available Help at Discharge: [place] [employer] (neighbors help each other out often there) Type of Home: Apartment Home Access: Level entry       Home Layout: One level Home Equipment: Other (comment) (unsure of exactly what equipment she  has, sister reports some form of a walker) Additional Comments: L UE deficits s/p a recent CVA and a fall while she was at a SNF that resulted in a fx per sister    Prior Function Prior Level of Function : Independent/Modified Independent;Driving             Mobility Comments: Began to ambulate without AD recently. ADLs Comments: Began to drive again recently.     Hand Dominance        Extremity/Trunk Assessment   Upper Extremity Assessment Upper Extremity Assessment: Defer to OT evaluation    Lower Extremity Assessment Lower Extremity Assessment: RLE deficits/detail RLE Deficits / Details: MMT scores of 5 in quads, 4 hip flexion, and noted ankle dorsiflexion weakness (L MMT of 5 quads, 4+ hip flexion); noted decreased sensation in R leg; incoordination noted with functional mobility RLE Sensation: decreased light touch RLE Coordination: decreased gross motor    Cervical / Trunk Assessment Cervical / Trunk Assessment: Normal  Communication   Communication: Expressive difficulties  Cognition Arousal/Alertness: Awake/alert Behavior During Therapy: Flat affect Overall Cognitive Status: Impaired/Different from baseline Area of Impairment: Problem solving, Awareness, Following commands, Attention                   Current Attention Level: Sustained   Following Commands: Follows one step commands inconsistently, Follows one step commands with increased time   Awareness: Intellectual Problem Solving: Slow processing, Decreased initiation, Difficulty sequencing, Requires verbal cues, Requires tactile cues General Comments: Pt with flat affect, intermittently speaking syaing "I don't know" and "my sister". Pt inconsistent with head shakes/nods and saying yes/no though. Pt needing increased time to process cues  and multi-modal cues for initiating and sequencing functional mobility.        General Comments General comments (skin integrity, edema, etc.): SBP 160s at  times when resting, but when repeated after repositioning BP cuff for better placement SBP read up to 152    Exercises     Assessment/Plan    PT Assessment Patient needs continued PT services  PT Problem List Decreased strength;Decreased activity tolerance;Decreased balance;Decreased mobility;Decreased coordination;Decreased cognition;Decreased knowledge of use of DME;Cardiopulmonary status limiting activity;Impaired sensation       PT Treatment Interventions DME instruction;Gait training;Functional mobility training;Therapeutic activities;Therapeutic exercise;Balance training;Neuromuscular re-education;Cognitive remediation;Patient/family education    PT Goals (Current goals can be found in the Care Plan section)  Acute Rehab PT Goals Patient Stated Goal: did not state; sister wants pt to improve PT Goal Formulation: With patient/family Time For Goal Achievement: 07/05/22 Potential to Achieve Goals: Good    Frequency Min 4X/week     Co-evaluation               AM-PAC PT "6 Clicks" Mobility  Outcome Measure Help needed turning from your back to your side while in a flat bed without using bedrails?: A Little Help needed moving from lying on your back to sitting on the side of a flat bed without using bedrails?: A Lot Help needed moving to and from a bed to a chair (including a wheelchair)?: A Lot Help needed standing up from a chair using your arms (e.g., wheelchair or bedside chair)?: A Lot Help needed to walk in hospital room?: Total Help needed climbing 3-5 steps with a railing? : Total 6 Click Score: 11    End of Session Equipment Utilized During Treatment: Gait belt;Oxygen Activity Tolerance: Patient tolerated treatment well Patient left: in chair;with call bell/phone within reach;with chair alarm set;with family/visitor present Nurse Communication: Mobility status PT Visit Diagnosis: Unsteadiness on feet (R26.81);Other abnormalities of gait and mobility  (R26.89);Muscle weakness (generalized) (M62.81);Difficulty in walking, not elsewhere classified (R26.2);Other symptoms and signs involving the nervous system (R29.898);Apraxia (R48.2)    Time: 1829-9371 PT Time Calculation (min) (ACUTE ONLY): 32 min   Charges:   PT Evaluation $PT Eval Moderate Complexity: 1 Mod PT Treatments $Therapeutic Activity: 8-22 mins        Moishe Spice, PT, DPT Acute Rehabilitation Services  Office: [phone]   Orvan Falconer 06/21/2022, 10:23 AM

## 2022-06-21 NOTE — Progress Notes (Signed)
Inpatient Rehab Admissions Coordinator Note:   Per PT/ST patient was screened for CIR candidacy by Ardelia Wrede Danford Bad, CCC-SLP. At this time, pt appears to be a potential candidate for CIR. I will place an order for rehab consult for full assessment, per our protocol.  Please contact me any with questions.Gayland Curry, [place], [place] Admissions Coordinator [phone] 06/21/22 4:08 PM

## 2022-06-21 NOTE — Evaluation (Signed)
Speech Language Pathology Evaluation Patient Details Name: [name] MRN: [id number] DOB: 1956-10-12 Today's Date: 06/21/2022 Time: 5809-9833 SLP Time Calculation (min) (ACUTE ONLY): 15 min  Problem List:  Patient Active Problem List   Diagnosis Date Noted   CVA (cerebral vascular accident) ([place]) 06/30/2021   Thalamic hemorrhage ([place]) 04/10/2021   ICH (intracerebral hemorrhage) ([place]) 04/10/2021   Pelvic pain-right lower quadrant 07/14/2018   Right lower quadrant abdominal pain 06/30/2018   Bilateral carotid artery stenosis 82/50/5397   Chronic systolic CHF (congestive heart failure) ([place]) 03/12/2018   Status post total knee replacement using cement, left 10/06/2016   History of coronary artery stent placement    Pure hypercholesterolemia    Recurrent UTI 02/11/2016   Menopause 02/11/2016   Angina pectoris ([place]) 01/07/2016   Syncope 07/11/2015   Orthostatic hypotension 02/19/2015   Dehydration 02/19/2015   Hypokalemia 02/19/2015   Stroke ([place]) 02/18/2015   Tobacco abuse 02/18/2015   History of stroke 07/18/2014   Vertigo 07/18/2014   Bilateral wrist pain 07/18/2014   Atypical chest pain 06/05/2014   Coronary artery disease of native artery of native heart with stable angina pectoris ([place]) 01/19/2014   Carotid arterial disease ([place]) 01/19/2014   Depression 01/16/2014   Memory loss 01/16/2014   Right sided weakness 08/10/2013   Essential hypertension 08/10/2013   Mixed hyperlipidemia 08/10/2013   History of MI (myocardial infarction) 08/10/2013   Stroke, acute, embolic ([place]) 67/34/1937   Past Medical History:  Past Medical History:  Diagnosis Date   Anxiety    Arthritis    CAD (coronary artery disease)    a. 03/2013 NSTEMI/Cath: 100 RCA, EF 45%->Med Rx;  b. 12/2013 Cath/PCI: LAD 70m 30d, D1 80, D2 60, LCX nl, RCA 50ost/p, 678m95d (2.5x33 Xience DES);  c. 05/2014 Lexi MV: EF 68%, no ischemia; d. stress echo 12/2015 no ischemia @ max exercise (did not achieve target)    Carotid disease, bilateral ([facility]   a. 08/2013 Carotid U/S: 1-39% bilat ICA stenosis.   Chronic kidney disease    Chronic kidney infections. Takes daily preventative.   Complication of anesthesia    CVA (cerebral vascular accident) ([facility]   a. 08/2013.   [place]ce[place]bral vascular accident) ([facility]   Decreased libido    Depression    GERD (gastroesophageal reflux disease)    History of 2019 novel coronavirus disease (COVID-19) 07/2019   History of recurrent UTIs    Hypercholesteremia    Hyperlipemia    Hypertension    Insomnia    Ischemic cardiomyopathy    a. 03/2013 Echo: EF 30-35%, mod dil LA, mod-sev MR, mod TR;  b. 08/2013 Echo EF 60-65%, mild AI.   Menopause    Migraines    Myocardial infarction (HCC)    PONV (postoperative nausea and vomiting)    If anesthsia is given slowly, pt will not throw up, if given quickly, pt will have nausea and vomiting.   Right lower quadrant pain    Syncope and collapse    Tobacco abuse    Vaginal atrophy    Past Surgical History:  Past Surgical History:  Procedure Laterality Date   CARDIAC CATHETERIZATION  01/01/2014   CARDIAC CATHETERIZATION  03/16/2013   CARDIAC CATHETERIZATION  12/2013   CARDIAC CATHETERIZATION N/A 08/19/2016   Procedure: Left Heart Cath and Coronary Angiography;  Surgeon: TiMinna MerrittsMD;  Location: [facility];  Service: Cardiovascular;  Laterality: N/A;   CESAREAN SECTION WITH BILATERAL TUBAL LIGATION  CORONARY ANGIOPLASTY WITH STENT PLACEMENT  01/01/2014   95% lesion with a drug eluting stent to the distal RCA.   ENDOMETRIAL ABLATION     KNEE ARTHROSCOPY  11/27/2006   left knee    OOPHORECTOMY     TEE WITHOUT CARDIOVERSION N/A 07/25/2021   Procedure: TRANSESOPHAGEAL ECHOCARDIOGRAM (TEE);  Surgeon: Minna Merritts, MD;  Location: [facility];  Service: Cardiovascular;  Laterality: N/A;   TOTAL KNEE ARTHROPLASTY Left 10/06/2016   Procedure: TOTAL KNEE ARTHROPLASTY;  Surgeon: Corky Mull, MD;  Location:  [facility];  Service: Orthopedics;  Laterality: Left;   TOTAL KNEE ARTHROPLASTY Right 11/12/2020   Procedure: TOTAL KNEE ARTHROPLASTY;  Surgeon: Corky Mull, MD;  Location: [facility];  Service: Orthopedics;  Laterality: Right;   TUBAL LIGATION     HPI:  66 y.o. female admitted with new onset aphasia. She has had numerous strokes in the past, both embolic and hemorrhagic. PMH of hypertension, multiple previous strokes currently on ASA and Plavix, migraines, MI, CAD, CKD, HLD, and cardiomyopathy. Has undergone swallow and speech evaluations multiple times since 2014. Imaging showed intraparenchymal hemorrhage centered at the left thalamus; age-related cerebral atrophy with moderate chronic small vessel ischemic disease, with multiple remote lacunar infarcts around the hemispheric cerebral white matter and deep gray nuclei.   Assessment / Plan / Recommendation Clinical Impression  Pt presents with an acute mixed aphasia.  She followed one-step commands with 75% accuracy; two step commands 50%.  She was able to express basic needs with some evidence of word-finding difficulty at conversational levels. Basic naming to confrontation and responsive naming were WNL, but functional word-retrieval and divergent naming proved to be difficult. There were significant delays in response time and reduced initiation and awareness. [name] will benefit from SLP to address aphasia and dysphagia - agree that AIR would be beneficial.    SLP Assessment  SLP Recommendation/Assessment: Patient needs continued Speech [place] [facility] SLP Visit Diagnosis: Aphasia (R47.01)    Recommendations for follow up therapy are one component of a multi-disciplinary discharge planning process, led by the attending physician.  Recommendations may be updated based on patient status, additional functional criteria and insurance authorization.    Follow Up Recommendations  Acute inpatient rehab (3hours/day)    Assistance  Recommended at Discharge  Frequent or constant Supervision/Assistance  Functional Status Assessment Patient has had a recent decline in their functional status and demonstrates the ability to make significant improvements in function in a reasonable and predictable amount of time.  Frequency and Duration min 2x/week  2 weeks      SLP Evaluation Cognition  Overall Cognitive Status: Difficult to assess       Comprehension  Auditory Comprehension Overall Auditory Comprehension: Impaired Yes/No Questions: Impaired Basic Immediate Environment Questions: 50-74% accurate Commands: Impaired One Step Basic Commands: 75-100% accurate Two Step Basic Commands: 50-74% accurate Conversation: Simple Visual Recognition/Discrimination Discrimination: Within Function Limits Reading Comprehension Reading Status: Impaired Sentence Level: Impaired    Expression Expression Primary Mode of Expression: Verbal Verbal Expression Overall Verbal Expression: Impaired Initiation: Impaired Automatic Speech: Name;Social Response;Counting;Day of week Level of Generative/Spontaneous Verbalization: Phrase Naming: Impairment Responsive: 76-100% accurate Confrontation: Within functional limits Divergent: 0-24% accurate Written Expression Dominant Hand: Right Written Expression: Not tested   Oral / Motor  Oral Motor/Sensory Function Overall Oral Motor/Sensory Function: Mild impairment Facial Symmetry: Abnormal symmetry right;Suspected CN VII (facial) dysfunction Lingual Symmetry: Within Functional Limits Motor Speech Overall Motor Speech: Appears within functional limits for tasks assessed  [name] 06/21/2022, 12:53 PM Estill Bamberg L. Tivis Ringer, MA CCC/SLP Clinical Specialist - [place] Office number [phone]

## 2022-06-21 NOTE — Progress Notes (Signed)
Speech Language Pathology Treatment: Dysphagia  Patient Details Name: [name] MRN: [id number] DOB: Sep 10, 1956 Today's Date: 06/21/2022 Time: 1205-1220 SLP Time Calculation (min) (ACUTE ONLY): 15 min  Assessment / Plan / Recommendation Clinical Impression  Sitting in recliner; friend [name] at bedside. Provided oral care - she has persisting laceration right upper lip that does not extend to inner cheek.  [name] accepted bites of pudding tentatively due to lip pain, demonstrating oral delays but eventual swallow.  Thin liquids continued to elicit immediate and explosive coughing, concerning for aspiration. There were no obvious deficits during consumption of nectars.  Recommend beginning a dysphagia 1 diet with nectar thick liquids.  SLP will follow for diet advancement as appropriate.  D/W pt and RN.   HPI HPI: 66 y.o. female admitted with new onset aphasia. She has had numerous strokes in the past, both embolic and hemorrhagic. PMH of hypertension, multiple previous strokes currently on ASA and Plavix, migraines, MI, CAD, CKD, HLD, and cardiomyopathy. Has undergone swallow and speech evaluations multiple times since 2014. Imaging showed intraparenchymal hemorrhage centered at the left thalamus; age-related cerebral atrophy with moderate chronic small vessel ischemic disease, with multiple remote lacunar infarcts around the hemispheric cerebral white matter and deep gray nuclei.      SLP Plan  Continue with current plan of care      Recommendations for follow up therapy are one component of a multi-disciplinary discharge planning process, led by the attending physician.  Recommendations may be updated based on patient status, additional functional criteria and insurance authorization.    Recommendations  Diet recommendations: Dysphagia 1 (puree);Nectar-thick liquid Liquids provided via: Cup;Straw Medication Administration: Whole meds with liquid Supervision: Staff to assist with self  feeding Compensations: Minimize environmental distractions                Oral Care Recommendations: Oral care BID Follow Up Recommendations: Acute inpatient rehab (3hours/day) Assistance recommended at discharge: Frequent or constant Supervision/Assistance SLP Visit Diagnosis: Dysphagia, oropharyngeal phase (R13.12) Plan: Continue with current plan of care        [name] L. Tivis Ringer, MA CCC/SLP Clinical Specialist - Acute Care SLP Acute Rehabilitation Services Office number [phone]    [name]  06/21/2022, 12:37 PM

## 2022-06-21 NOTE — Progress Notes (Addendum)
STROKE TEAM PROGRESS NOTE    INTERVAL HISTORY  Cleviprex drip is off.  Blood pressure adequately controlled.  RN at the bedside. K 3.3. will replace  Her sister is at the bedside. She is alert and sitting in the chair. Able to identify her sister. Delayed response. Still with Expressive> receptive aphasia, but improved some from yesterday. Following commands.  Right arm weak. Right leg mild weakness. Weak grip on right side. Speech to re-evaluate today to determine diet. If does not pass with speech may need to consider a feeding tube.  Neurological exam stable, VSS. Will transfer out of the ICU today.  Medical hospitalist to assume care tomorrow.  We will start Lovenox for DVT prophylaxis  Vitals:   06/21/22 0600 06/21/22 0630 06/21/22 0645 06/21/22 0700  BP: (!) 156/79 139/72 137/68 (!) 140/73  Pulse: 81 71 72 71  Resp: '16 16 17 20  '$ Temp:      TempSrc:      SpO2: 95% 99% 99% 100%  Weight:       CBC:  Recent Labs  Lab 06/19/22 0902 06/21/22 0124  WBC 10.2 12.8*  NEUTROABS 6.7  --   HGB 12.8 13.3  HCT 38.8 39.0  MCV 92.4 91.8  PLT 276 751    Basic Metabolic Panel:  Recent Labs  Lab 06/19/22 0902 06/21/22 0124  NA 143 143  K 3.5 3.3*  CL 110 108  CO2 23 24  GLUCOSE 100* 104*  BUN 15 18  CREATININE 0.86 0.87  CALCIUM 9.3 9.4    Lipid Panel:  Recent Labs  Lab 06/20/22 0937  CHOL 174  TRIG 206*  HDL 58  CHOLHDL 3.0  VLDL 41*  LDLCALC 75    HgbA1c:  Recent Labs  Lab 06/20/22 0937  HGBA1C 6.0*    Urine Drug Screen:  Recent Labs  Lab 06/19/22 1030  LABOPIA NONE DETECTED  COCAINSCRNUR NONE DETECTED  LABBENZ NONE DETECTED  AMPHETMU NONE DETECTED  THCU NONE DETECTED  LABBARB NONE DETECTED     Alcohol Level  Recent Labs  Lab 06/19/22 0902  ETH <10     IMAGING past 24 hours CT ANGIO HEAD NECK W WO CM  Result Date: 06/21/2022 CLINICAL DATA:  Follow-up exam for hemorrhagic stroke. EXAM: CT ANGIOGRAPHY HEAD AND NECK TECHNIQUE: Multidetector CT  imaging of the head and neck was performed using the standard protocol during bolus administration of intravenous contrast. Multiplanar CT image reconstructions and MIPs were obtained to evaluate the vascular anatomy. Carotid stenosis measurements (when applicable) are obtained utilizing NASCET criteria, using the distal internal carotid diameter as the denominator. RADIATION DOSE REDUCTION: This exam was performed according to the departmental dose-optimization program which includes automated exposure control, adjustment of the mA and/or kV according to patient size and/or use of iterative reconstruction technique. CONTRAST:  144m OMNIPAQUE IOHEXOL 350 MG/ML SOLN COMPARISON:  Prior studies from 06/20/2022 and earlier FINDINGS: CT HEAD FINDINGS Brain: Previously identified left thalamic hemorrhage is not significantly changed in size and morphology as compared to previous. Mild localized edema without significant regional mass effect. No intraventricular extension. No new intracranial hemorrhage or large vessel territory infarct. Underlying atrophy with chronic microvascular ischemic disease again noted. Few additional scattered remote lacunar infarcts noted, better characterized prior brain MRI. No mass lesion or significant midline shift. No hydrocephalus or extra-axial fluid collection. Vascular: No hyperdense vessel. Skull: Scalp soft tissues and calvarium demonstrate no new finding. Sinuses/Orbits: Left gaze noted. Globes orbital soft tissues demonstrate no other acute finding.  Mild scattered mucosal thickening about the sphenoethmoidal sinuses. Mastoid air cells remain largely clear. Other: None. Review of the MIP images confirms the above findings CTA NECK FINDINGS Aortic arch: Visualized aortic arch normal caliber with standard 3 vessel morphology. No stenosis or other abnormality about the orbital great vessels. Right carotid system: Right common and internal carotid arteries patent without dissection or  other acute finding. Mild mixed plaque about the right carotid bulb without hemodynamically significant stenosis. Left carotid system: Left common and internal carotid arteries patent without dissection or other acute finding. Calcified plaque about the left carotid bulb without hemodynamically significant greater than 50% stenosis. Vertebral arteries: Both vertebral arteries arise from the subclavian arteries. No proximal subclavian artery stenosis. Left vertebral artery dominant. Vertebral arteries patent without stenosis or dissection. Skeleton: No discrete or worrisome osseous lesions. Mild spondylosis for age. Other neck: No other acute soft tissue abnormality within the neck. Upper chest: Hazy subsegmental atelectatic changes noted dependently within the visualized lungs. Visualized upper chest demonstrates no other acute finding. Review of the MIP images confirms the above findings CTA HEAD FINDINGS Anterior circulation: Petrous segments patent bilaterally. Mild for age atheromatous change within the carotid siphons without significant stenosis. A1 segments patent bilaterally. Normal anterior communicating complex. Atheromatous irregularity within both ACAs without significant stenosis. No M1 stenosis or occlusion. No proximal MCA branch occlusion. Distal MCA branches perfused and symmetric. Distal small vessel atheromatous irregularity noted. Posterior circulation: Atheromatous irregularity within the V4 segments bilaterally without significant stenosis. Left PICA patent. Right PICA origin not well seen. Basilar patent to its distal aspect without stenosis superior cerebral arteries patent bilaterally. Both PCAs primarily supplied via the basilar. Atheromatous irregularity throughout the PCAs bilaterally. Associated moderate proximal left P2 stenosis (series 14, image 20). No other hemodynamically significant stenosis. Venous sinuses: Patent allowing for timing the contrast bolus. Anatomic variants: None  significant. No intracranial aneurysm. No vascular malformation seen overlying the left thalamic hemorrhage. Review of the MIP images confirms the above findings IMPRESSION: CT HEAD IMPRESSION: 1. Relatively stable size and appearance of acute left thalamic hemorrhage. Mild localized edema without significant regional mass effect. No interval intraventricular extension or other complicating features. 2. No other new acute intracranial abnormality. CTA HEAD AND NECK IMPRESSION: 1. Negative CTA for large vessel occlusion. No vascular abnormality seen underlying the left thalamic hemorrhage. 2. Mild-to-moderate atheromatous change about the major arterial vasculature of the neck. Associated moderate proximal left P2 stenosis. No other hemodynamically significant or correctable stenosis. Electronically Signed   By: Jeannine Boga M.D.   On: 06/21/2022 04:52    PHYSICAL EXAM  Temp:  [97.7 F (36.5 C)-100.1 F (37.8 C)] 99 F (37.2 C) (08/13 0400) Pulse Rate:  [64-98] 71 (08/13 0700) Resp:  [13-25] 20 (08/13 0700) BP: (120-182)/(64-111) 140/73 (08/13 0700) SpO2:  [95 %-100 %] 100 % (08/13 0700)  General - Well nourished, well developed middle-aged Caucasian lady, in no apparent distress.  Cardiovascular - Regular rhythm and rate.  Mental Status -  Alert, eyes open tracking examiner and able to focus as well. Oriented to person, place.  Expressive> receptive aphasia.  Speaks only a few words and is nonfluent not able to speak sentences.  Able to follow some one step commands.  Cranial Nerves II - XII - II - Visual field intact OU. III, IV, VI - Extraocular movements intact. V - Facial sensation intact bilaterally. VII - Facial movement intact bilaterally. VIII - Hearing & vestibular intact bilaterally. X - Palate elevates symmetrically. XI -  Chin turning & shoulder shrug intact bilaterally. XII - Tongue protrusion intact.  Motor Strength -  RUE proximal weakness with good grip strength.   Mild RLE weakness present. Left UE and LE at least antigravity.   Motor Tone - Muscle tone was assessed at the neck and appendages and was normal.  Sensory - Light touch intact and symmetrical.    Coordination - Timpaired coordination RUE, unable to perform RAMs or finger to thumb  Gait and Station - deferred.   ASSESSMENT/PLAN [name] is a 66 y.o. female with a PMH of hypertension, multiple previous strokes currently on ASA and Plavix, migraines, MI, CAD, CKD, HLD, and cardiomyopathy presenting with difficulty speaking. She has had numerous strokes in the past both embolic and hemorrhagic. She did have a Zio patch after her last stroke which was negative for atrial fibrillation and she does follow with Dr. Quentin Ore and Christell Faith with [facility] (most recently seen 03/11/2022). She woke up on the morning of 06/19/2022 unable to speak and EMS was called. She went to bed in her usual state of health. She denies missing medications. Of note, she does take ASA, Plavix, rosuvastatin, furosemide, Norvasc, and Toprol-XL. Cleviprex used at [facility] for BP management, continued at [facility].   Stroke: Acute left thalamic hemorrhage while on DAPT with plavix and ASA for stroke prevention which was likely hypertensive in etiology.  Code Stroke CT head Acute left thalamic hemorrhage with surrounding edema and minor mass effect. No intraventricular extension Repeat Head CT 8/11 Small 2 mL left thalamic hemorrhage, blood products minimally increased since 0853 hours yesterday. Regional edema but no significant mass effect or other complicating features. No new intracranial abnormality. CTA head & neck - Relatively stable size and appearance of acute left thalamic hemorrhage. Mild localized edema without significant regional mass effect. No interval intraventricular extension or other complicating features. No other new acute intracranial abnormality. MRI   No significant interval change in size and morphology of  acute intraparenchymal hemorrhage centered at the left thalamus, estimated volume 1.4 mL. Surrounding vasogenic edema with trace localized left-to-right shift. No intraventricular extension. No visible underlying lesion at this location.No other acute intracranial abnormality. Age-related cerebral atrophy with moderate chronic small vessel ischemic disease, with multiple remote lacunar infarcts about the hemispheric cerebral white matter and deep gray nuclei. 2D Echo EF 65-70%, Mild LVH, Grade 1 diastolic dysfunction, No thrombus or shunt detected LDL 90 HgbA1c 6.2 VTE prophylaxis - SCDs    Diet   Diet NPO time specified   On ASA 81 mg and Plavix '75mg'$  prior to admission.  Plan to hold them due to intracerebral hemorrhage and may consider adding aspirin at discharge No antiplatelet or anticoagulant medications at present Therapy recommendations:  CIR Disposition:  TBD  Hypertension Home meds:  Metoprolol, Amlodipine,  Unstable, currently requiring cleviprex drip with labetolol and hydralazine prn as she is currently NPO without access for po meds. Wean cleviprex as tolerated.  BP goal<160  Hyperlipidemia Home meds:  resumed in hospital LDL 90, goal < 70 High intensity statin not indicated  Continue statin at discharge  Diabetes type II Uncontrolled Home meds:   HgbA1c 6.2, goal < 7.0 CBGs Recent Labs    06/19/22 0859  GLUCAP 102*    SSI  Dysphagia NPO per SLP recommendations On NS '@75cc'$ /hr Monitor BMP  Hypokalemia  K 3.3 will replace and check in am   Other Stroke Risk Factors Advanced Age >/= 24  Former Cigarette smoker e as appropriate  Hx stroke Family hx stroke (mother) Coronary artery disease Migraines  Hospital day # 2 This patient was seen and evaluated with Dr. Leonie Man. He directed the plan of care.   Beulah Gandy DNP, ACNPC-AG   I have personally obtained history,examined this patient, reviewed notes, independently viewed imaging studies,  participated in medical decision making and plan of care.ROS completed by me personally and pertinent positives fully documented  I have made any additions or clarifications directly to the above note. Agree with note above.  Patient appears to be somewhat improved today though she is still aphasic with delayed processing and reaction times and mild right hemiparesis.  Speech therapy to continue swallow eval but will hold off on feeding tube as she appears to be improving.  Continue strict blood pressure control with systolic goal below 759.  Mobilize out of bed.  Physical occupational and speech therapy and rehab consults.  Start Lovenox for DVT prophylaxis.  Transfer to neurology floor bed when available.  Long discussion with patient and sister at the bedside and answered questions.This patient is critically ill and at significant risk of neurological worsening, death and care requires constant monitoring of vital signs, hemodynamics,respiratory and cardiac monitoring, extensive review of multiple databases, frequent neurological assessment, discussion with family, other specialists and medical decision making of high complexity.I have made any additions or clarifications directly to the above note.This critical care time does not reflect procedure time, or teaching time or supervisory time of PA/NP/Med Resident etc but could involve care discussion time.  I spent 30 minutes of neurocritical care time  in the care of  this patient.      Antony Contras, MD Medical Director [facility] Pager: [phone] 06/21/2022 1:33 PM  To contact Stroke Continuity provider, please refer to [email or web address] After hours, contact General Neurology

## 2022-06-22 DIAGNOSIS — E876 Hypokalemia: Secondary | ICD-10-CM | POA: Diagnosis not present

## 2022-06-22 DIAGNOSIS — I10 Essential (primary) hypertension: Secondary | ICD-10-CM

## 2022-06-22 DIAGNOSIS — Z8673 Personal history of transient ischemic attack (TIA), and cerebral infarction without residual deficits: Secondary | ICD-10-CM | POA: Diagnosis not present

## 2022-06-22 DIAGNOSIS — I5022 Chronic systolic (congestive) heart failure: Secondary | ICD-10-CM | POA: Diagnosis not present

## 2022-06-22 DIAGNOSIS — Z955 Presence of coronary angioplasty implant and graft: Secondary | ICD-10-CM

## 2022-06-22 DIAGNOSIS — I610 Nontraumatic intracerebral hemorrhage in hemisphere, subcortical: Secondary | ICD-10-CM | POA: Diagnosis not present

## 2022-06-22 LAB — BASIC METABOLIC PANEL
Anion gap: 10 (ref 5–15)
BUN: 18 mg/dL (ref 8–23)
CO2: 20 mmol/L — ABNORMAL LOW (ref 22–32)
Calcium: 9 mg/dL (ref 8.9–10.3)
Chloride: 113 mmol/L — ABNORMAL HIGH (ref 98–111)
Creatinine, Ser: 0.86 mg/dL (ref 0.44–1.00)
GFR, Estimated: >60 mL/min (ref >60)
Glucose, Bld: 87 mg/dL (ref 70–99)
Potassium: 3.6 mmol/L (ref 3.5–5.1)
Sodium: 143 mmol/L (ref 135–145)

## 2022-06-22 LAB — MAGNESIUM: Magnesium: 1.9 mg/dL (ref 1.7–2.4)

## 2022-06-22 MED ORDER — POTASSIUM CHLORIDE 20 MEQ PO PACK
40.0000 meq | PACK | Freq: Once | ORAL | Status: AC
  Administered 2022-06-22: 40 meq via ORAL
  Filled 2022-06-22: qty 2

## 2022-06-22 NOTE — Care Management Important Message (Signed)
Important Message  Patient Details  Name: [name] MRN: [id number] Date of Birth: 04-Sep-1956   Medicare Important Message Given:  Yes     Orbie Pyo 06/22/2022, 2:28 PM

## 2022-06-22 NOTE — Progress Notes (Addendum)
STROKE TEAM PROGRESS NOTE    INTERVAL HISTORY  Patient was transferred to the floor.  No family at the bedside  She is alert and sitting in the chair. Still with expressive > receptive aphasia and delayed responses. Follows commands with right-sided weakness. Minimal improvement in right lower extremity weakness on exam. Neurologic exam stable, VSS stable.   Speech evaluation yesterday: diet dysphagia with nectar-thick liquid. Staff assist with self feeding. Plan to discharge to CIR.   Spoke with hospitalist at bedside, updated on plan of care.   Vitals:   06/21/22 2023 06/21/22 2335 06/22/22 0443 06/22/22 0731  BP: (!) 142/69 (!) 146/73 133/62 (!) 161/73  Pulse: 71 72 72 64  Resp: '16 19 16 20  '$ Temp: (!) 97.5 F (36.4 C) 98 F (36.7 C) 99.2 F (37.3 C) 98.8 F (37.1 C)  TempSrc: Oral  Oral Oral  SpO2: 93% 95% 94% 95%  Weight:      Height:       CBC:  Recent Labs  Lab 06/19/22 0902 06/21/22 0124  WBC 10.2 12.8*  NEUTROABS 6.7  --   HGB 12.8 13.3  HCT 38.8 39.0  MCV 92.4 91.8  PLT 276 176   Basic Metabolic Panel:  Recent Labs  Lab 06/21/22 0124 06/22/22 0647  NA 143 143  K 3.3* 3.6  CL 108 113*  CO2 24 20*  GLUCOSE 104* 87  BUN 18 18  CREATININE 0.87 0.86  CALCIUM 9.4 9.0  MG  --  1.9   Lipid Panel:  Recent Labs  Lab 06/20/22 0937  CHOL 174  TRIG 206*  HDL 58  CHOLHDL 3.0  VLDL 41*  LDLCALC 75   HgbA1c:  Recent Labs  Lab 06/20/22 0937  HGBA1C 6.0*   Urine Drug Screen:  Recent Labs  Lab 06/19/22 1030  LABOPIA NONE DETECTED  COCAINSCRNUR NONE DETECTED  LABBENZ NONE DETECTED  AMPHETMU NONE DETECTED  THCU NONE DETECTED  LABBARB NONE DETECTED   Alcohol Level  Recent Labs  Lab 06/19/22 0902  ETH <10   IMAGING past 24 hours No results found.  PHYSICAL EXAM  Temp:  [97.5 F (36.4 C)-99.2 F (37.3 C)] 98.8 F (37.1 C) (08/14 0731) Pulse Rate:  [59-83] 64 (08/14 0731) Resp:  [12-21] 20 (08/14 0731) BP: (133-178)/(62-124) 161/73  (08/14 0731) SpO2:  [92 %-100 %] 95 % (08/14 0731) Weight:  [66.2 kg] 66.2 kg (08/13 1837)  General - Well nourished, well developed middle-aged Caucasian lady, in no apparent distress.  Cardiovascular - Regular rhythm and rate.  Mental Status -  Alert, eyes open tracking examiner and able to focus as well. Oriented to person and place.  Expressive> receptive aphasia.  Speaks only a few words. She is nonfluent not able to speak sentences.  Able to follow simple one step commands.  Cranial Nerves II - XII - II - Visual field intact OU. III, IV, VI - Extraocular movements intact. V - Facial sensation intact bilaterally. VII - Facial movement intact bilaterally. VIII - Hearing & vestibular intact bilaterally. X - Palate elevates symmetrically. XI - Chin turning & shoulder shrug intact bilaterally. XII - Tongue protrusion intact.  Motor Strength -  RUE proximal weakness with good grip strength.  Mild RLE weakness present. Left UE and LE at least antigravity.   Motor Tone - Muscle tone was assessed at the neck and appendages and was normal.  Sensory - Light touch intact and symmetrical.    Coordination - Timpaired coordination RUE, unable to perform  RAMs or finger to thumb  Gait and Station - deferred.   ASSESSMENT/PLAN SHAYONNA OCAMPO is a 66 y.o. female with a PMH of hypertension, multiple previous strokes currently on ASA and Plavix, migraines, MI, CAD, CKD, HLD, and cardiomyopathy presenting with difficulty speaking. She has had numerous strokes in the past both embolic and hemorrhagic. She did have a Zio patch after her last stroke which was negative for atrial fibrillation and she does follow with Dr. Quentin Ore and Christell Faith with [facility] (most recently seen 03/11/2022). She woke up on the morning of 06/19/2022 unable to speak and EMS was called. She went to bed in her usual state of health. She denies missing medications. Of note, she does take ASA, Plavix, rosuvastatin, furosemide,  Norvasc, and Toprol-XL. Cleviprex used at [facility] for BP management, continued at [facility].   Stroke: Acute left thalamic hemorrhage while on DAPT with plavix and ASA for stroke prevention which was likely hypertensive in etiology.  Code Stroke CT head: Acute left thalamic hemorrhage with surrounding edema and minor mass effect. No intraventricular extension Repeat Head CT 8/11: Small 2 mL left thalamic hemorrhage, blood products minimally increased since 0853 hours yesterday. Regional edema but no significant mass effect or other complicating features. No new intracranial abnormality. CTA head & neck - Relatively stable size and appearance of acute left thalamic hemorrhage. Mild localized edema without significant regional mass effect. No interval intraventricular extension or other complicating features. No other new acute intracranial abnormality. MRI: No significant interval change in size and morphology of acute intraparenchymal hemorrhage centered at the left thalamus, estimated volume 1.4 mL. Surrounding vasogenic edema with trace localized left-to-right shift. No intraventricular extension. No visible underlying lesion at this location.No other acute intracranial abnormality. Age-related cerebral atrophy with moderate chronic small vessel ischemic disease, with multiple remote lacunar infarcts about the hemispheric cerebral white matter and deep gray nuclei. 2D Echo EF 65-70%, Mild LVH, Grade 1 diastolic dysfunction, No thrombus or shunt detected LDL 90 HgbA1c 6.2 VTE prophylaxis - SCDs    Diet   DIET - DYS 1 Room service appropriate? Yes with Assist; Fluid consistency: Nectar Thick   On ASA 81 mg and Plavix '75mg'$  prior to admission.  Plan to hold them due to intracerebral hemorrhage and may consider adding aspirin at discharge No antiplatelet or anticoagulant medications at present Therapy recommendations:  CIR Disposition:  TBD  Hypertension Home meds:  Metoprolol, Amlodipine,  Unstable,  currently requiring cleviprex drip with labetolol and hydralazine prn as she is currently NPO without access for po meds. Wean cleviprex as tolerated.  BP goal<160  Hyperlipidemia Home meds: Crestor resumed in hospital LDL 90, goal < 70 On high intensity statin at home Continue at discharge  Diabetes type II Controlled Home meds: None HgbA1c 6.0, goal < 7.0 CBGs No results for input(s): "GLUCAP" in the last 72 hours. SSI as needed  Dysphagia SLP recommends dysphagia 1 diet with puree and nectar-thick liquids and staff assist with feeding  Discontinued IVF today with oral intake  Monitor BMP  Hypokalemia  K 3.3 s/p replacement -> 3.6  Other Stroke Risk Factors Advanced Age >/= 32  Former Cigarette smoker Hx stroke Family hx stroke (mother) Coronary artery disease Migraines  Hospital day # 3 This patient was seen and evaluated with Dr. Leonie Man. He directed the plan of care.  -- Anibal Henderson, AGACNP-BC [facility] [phone]  I have personally obtained history,examined this patient, reviewed notes, independently viewed imaging studies, participated in medical decision making and  plan of care.ROS completed by me personally and pertinent positives fully documented  I have made any additions or clarifications directly to the above note. Agree with note above.  Continue ongoing therapies and mobilize out of bed.  Transfer to inpatient rehab when bed available.  Discussed with patient and Dr.Pokhrel and answered questions.  Greater than 50% time during this 35-minute visit was spent in counseling and coordination of care and discussion patient and care team and answering questions  Antony Contras, MD Medical Director [place] Pager: [phone] 06/22/2022 1:30 PM  To contact Stroke Continuity provider, please refer to [email or web address] After hours, contact General Neurology

## 2022-06-22 NOTE — Progress Notes (Signed)
Inpatient Rehab Admissions Coordinator:    I spoke with Pt. Regarding potential CIR admit. Pt. Family working on plan for 24/7 support They request a day to work on it. I will follow for potential admit pending insurance auth and identification of dispo.   Clemens Catholic, [place], [place] Admissions Coordinator  [phone] ([place]) [phone] (office)

## 2022-06-22 NOTE — Evaluation (Signed)
Occupational Therapy Evaluation Patient Details Name: [name] MRN: [id number] DOB: 01-30-1956 Today's Date: 06/22/2022   History of Present Illness Pt is a 66 y.o. female who presented 06/19/22 with difficulty speaking. Pt found to have acute intraparenchymal hemorrhage centered at the left thalamus with trace localized left-to-right shift. PMH: HTN, numerous strokes in the past both embolic and hemorrhagic currently on ASA and Plavix, migraines, MI, CAD, CKD, HLD, and cardiomyopathy   Clinical Impression   Per chart review, pt ind at baseline with ADLs and functional mobility, lives alone in an apartment. Pt currently needing min-max A for ADLs, min guard for bed mobility, and mod-max A for step pivot transfer to chair, pt with difficulty coordinating step with RLE and sequencing transfer. Pt nodding yes/no inconsistently to questions asked during session, follows commands with increased time. Cognition difficult to assess due to impaired communication, however nods "no" when asked if it is January, and "yes" and when asked if it is August. Pt with mild RUE >LUE weakness, however WFL for seated grooming tasks. Pt presenting with impairments listed below, will follow acutely. Recommend AIR at d/c.     Recommendations for follow up therapy are one component of a multi-disciplinary discharge planning process, led by the attending physician.  Recommendations may be updated based on patient status, additional functional criteria and insurance authorization.   Follow Up Recommendations  Acute inpatient rehab (3hours/day)    Assistance Recommended at Discharge Frequent or constant Supervision/Assistance  Patient can return home with the following A lot of help with walking and/or transfers;A lot of help with bathing/dressing/bathroom;Direct supervision/assist for medications management;Direct supervision/assist for financial management;Assist for transportation;Help with stairs or ramp for  entrance    Functional Status Assessment  Patient has had a recent decline in their functional status and demonstrates the ability to make significant improvements in function in a reasonable and predictable amount of time.  Equipment Recommendations  None recommended by OT (defer)    Recommendations for Other Services PT consult;Rehab consult     Precautions / Restrictions Precautions Precautions: Fall Precaution Comments: SBP 130-150 Restrictions Weight Bearing Restrictions: No      Mobility Bed Mobility Overal bed mobility: Needs Assistance Bed Mobility: Supine to Sit     Supine to sit: Min guard     General bed mobility comments: increased time    Transfers Overall transfer level: Needs assistance Equipment used: 1 person hand held assist Transfers: Sit to/from Stand, Bed to chair/wheelchair/BSC Sit to Stand: Min assist     Step pivot transfers: Mod assist, Max assist     General transfer comment: max A for sequencing of transfer, pt with difficulty stepping RLE      Balance Overall balance assessment: Needs assistance Sitting-balance support: Single extremity supported, Bilateral upper extremity supported, Feet supported Sitting balance-Leahy Scale: Fair Sitting balance - Comments: can sit EOB for dynamic task without LOB   Standing balance support: Bilateral upper extremity supported, During functional activity Standing balance-Leahy Scale: Poor Standing balance comment: reliant on external support                           ADL either performed or assessed with clinical judgement   ADL Overall ADL's : Needs assistance/impaired Eating/Feeding: Minimal assistance   Grooming: Minimal assistance;Brushing hair;Wash/dry face   Upper Body Bathing: Moderate assistance   Lower Body Bathing: Maximal assistance   Upper Body Dressing : Moderate assistance   Lower Body Dressing: Maximal assistance  Toilet Transfer: Moderate assistance;Maximal  assistance   Toileting- Clothing Manipulation and Hygiene: Moderate assistance       Functional mobility during ADLs: Moderate assistance;Maximal assistance       Vision   Vision Assessment?: No apparent visual deficits Additional Comments: funcitonal for ADL tasks, will furher assess     Perception     Praxis      Pertinent Vitals/Pain Pain Assessment Pain Assessment: No/denies pain Pain Intervention(s): Monitored during session     Hand Dominance Right   Extremity/Trunk Assessment Upper Extremity Assessment Upper Extremity Assessment: LUE deficits/detail;RUE deficits/detail RUE Deficits / Details: mild weakness compared to L, able to functionall use for ADL task, shoulder AROM and grip strength WFL RUE Coordination: decreased fine motor LUE Deficits / Details: able to use functionally for ADL, shoulder AROM and grip strength WFL LUE Coordination: decreased fine motor   Lower Extremity Assessment Lower Extremity Assessment: Defer to PT evaluation   Cervical / Trunk Assessment Cervical / Trunk Assessment: Normal   Communication Communication Communication: Expressive difficulties   Cognition Arousal/Alertness: Awake/alert Behavior During Therapy: Flat affect Overall Cognitive Status: Difficult to assess Area of Impairment: Problem solving, Awareness, Following commands, Attention                   Current Attention Level: Sustained   Following Commands: Follows one step commands with increased time   Awareness: Intellectual Problem Solving: Slow processing, Decreased initiation, Difficulty sequencing, Requires verbal cues, Requires tactile cues General Comments: Pt inconsistently nodding yes/no to questions asked, pt with minimal communication, following commands appropriatley     General Comments  BP 187/79 (107) HR 67 post-transfer, RN notified, further mobility deferred.    Exercises     Shoulder Instructions      Home Living Family/patient  expects to be discharged to:: Private residence Living Arrangements: Alone Available Help at Discharge: Other (Comment) (help from neighbors) Type of Home: Apartment Home Access: Level entry     Home Layout: One level     Bathroom Shower/Tub: Teacher, early years/pre: Handicapped height         Additional Comments: L UE deficits s/p a recent CVA and a fall while she was at a SNF that resulted in a fx per sister. Info obtained per chart review as pt unable to provide  Lives With: Alone    Prior Functioning/Environment Prior Level of Function : Independent/Modified Independent;Driving             Mobility Comments: Began to ambulate without AD recently. ADLs Comments: Began to drive again recently.        OT Problem List: Decreased strength;Decreased activity tolerance;Decreased range of motion;Impaired balance (sitting and/or standing);Decreased cognition;Decreased coordination;Decreased safety awareness;Cardiopulmonary status limiting activity      OT Treatment/Interventions: Self-care/ADL training;Therapeutic exercise;Energy conservation;DME and/or AE instruction;Therapeutic activities;Patient/family education;Visual/perceptual remediation/compensation;Cognitive remediation/compensation;Balance training;Neuromuscular education    OT Goals(Current goals can be found in the care plan section) Acute Rehab OT Goals Patient Stated Goal: pt unable to state OT Goal Formulation: With patient Time For Goal Achievement: 07/06/22 Potential to Achieve Goals: Good ADL Goals Pt Will Perform Upper Body Dressing: with min guard assist;sitting Pt Will Perform Lower Body Dressing: with min assist;sitting/lateral leans;sit to/from stand Pt Will Transfer to Toilet: with min assist;squat pivot transfer;stand pivot transfer;bedside commode Additional ADL Goal #1: pt will complete bed mobility ind in prep for ADLs  OT Frequency: Min 2X/week    Co-evaluation  AM-PAC OT "6 Clicks" Daily Activity     Outcome Measure Help from another person eating meals?: A Little Help from another person taking care of personal grooming?: A Little Help from another person toileting, which includes using toliet, bedpan, or urinal?: A Lot Help from another person bathing (including washing, rinsing, drying)?: A Lot Help from another person to put on and taking off regular upper body clothing?: A Lot Help from another person to put on and taking off regular lower body clothing?: A Lot 6 Click Score: 14   End of Session Equipment Utilized During Treatment: Gait belt Nurse Communication: Mobility status;Other (comment) (BP)  Activity Tolerance: Patient tolerated treatment well Patient left: in chair;with call bell/phone within reach;with chair alarm set  OT Visit Diagnosis: Unsteadiness on feet (R26.81);Other abnormalities of gait and mobility (R26.89);Muscle weakness (generalized) (M62.81);Other symptoms and signs involving cognitive function;Cognitive communication deficit (R41.841)                Time: 7473-4037 OT Time Calculation (min): 27 min Charges:  OT General Charges $OT Visit: 1 Visit OT Evaluation $OT Eval Moderate Complexity: 1 Mod OT Treatments $Self Care/Home Management : 8-22 mins  Lynnda Child, OTD, OTR/L Acute Rehab [phone]) [phone] [place] 06/22/2022, 9:20 AM

## 2022-06-22 NOTE — Progress Notes (Signed)
Occupational Therapy Treatment Patient Details Name: [name] MRN: [id number] DOB: Jul 23, 1956 Today's Date: 06/22/2022   History of present illness Pt is a 66 y.o. female who presented 06/19/22 with difficulty speaking. Pt found to have acute intraparenchymal hemorrhage centered at the left thalamus with trace localized left-to-right shift. PMH: HTN, numerous strokes in the past both embolic and hemorrhagic currently on ASA and Plavix, migraines, MI, CAD, CKD, HLD, and cardiomyopathy   OT comments  Pt seen for second session for placement of RW splint for RUE, pt with improved standing balance, however presents with R inattention needing cues to maintain hand on RW. Pt needing mod-total A for gown change and pericare during session. Pt presenting with impairments listed below, will follow acutely. Continue to recommend AIR at d/c.   Recommendations for follow up therapy are one component of a multi-disciplinary discharge planning process, led by the attending physician.  Recommendations may be updated based on patient status, additional functional criteria and insurance authorization.    Follow Up Recommendations  Acute inpatient rehab (3hours/day)    Assistance Recommended at Discharge Frequent or constant Supervision/Assistance  Patient can return home with the following  A lot of help with walking and/or transfers;A lot of help with bathing/dressing/bathroom;Direct supervision/assist for medications management;Direct supervision/assist for financial management;Assist for transportation;Help with stairs or ramp for entrance   Equipment Recommendations  None recommended by OT (defer)    Recommendations for Other Services PT consult;Rehab consult    Precautions / Restrictions Precautions Precautions: Fall Precaution Comments: SBP 130-150 Restrictions Weight Bearing Restrictions: No       Mobility Bed Mobility Overal bed mobility: Needs Assistance Bed Mobility: Sit to Supine      Supine to sit: Min guard Sit to supine: Min assist   General bed mobility comments: increased time    Transfers Overall transfer level: Needs assistance Equipment used: 1 person hand held assist, Rolling walker (2 wheels) (with R hand walker splint) Transfers: Sit to/from Stand, Bed to chair/wheelchair/BSC Sit to Stand: Min assist     Step pivot transfers: Min assist     General transfer comment: max A for sequencing of transfer, pt with difficulty stepping RLE     Balance Overall balance assessment: Needs assistance Sitting-balance support: Single extremity supported, Bilateral upper extremity supported, Feet supported Sitting balance-Leahy Scale: Fair Sitting balance - Comments: can sit EOB for dynamic task without LOB   Standing balance support: Bilateral upper extremity supported, During functional activity Standing balance-Leahy Scale: Poor Standing balance comment: reliant on external support                           ADL either performed or assessed with clinical judgement   ADL Overall ADL's : Needs assistance/impaired                 Upper Body Dressing : Moderate assistance;Sitting Upper Body Dressing Details (indicate cue type and reason): to don gown Lower Body Dressing: Moderate assistance;Sit to/from stand Lower Body Dressing Details (indicate cue type and reason): to don undergarment Toilet Transfer: Moderate assistance;Stand-pivot;Ambulation;BSC/3in1;Rolling walker (2 wheels) (with walker splint RUE)   Toileting- Clothing Manipulation and Hygiene: Total assistance;Sit to/from stand Toileting - Clothing Manipulation Details (indicate cue type and reason): pericare     Functional mobility during ADLs: Moderate assistance;Maximal assistance      Extremity/Trunk Assessment Upper Extremity Assessment Upper Extremity Assessment: LUE deficits/detail;RUE deficits/detail RUE Deficits / Details: mild weakness compared to L, able to  functionall use for ADL task, shoulder AROM and grip strength WFL RUE Coordination: decreased fine motor LUE Deficits / Details: able to use functionally for ADL, shoulder AROM and grip strength WFL LUE Coordination: decreased fine motor   Lower Extremity Assessment Lower Extremity Assessment: Defer to PT evaluation        Vision   Vision Assessment?: No apparent visual deficits Additional Comments: will further assess   Perception Perception Perception: Not tested   Praxis Praxis Praxis: Not tested    Cognition Arousal/Alertness: Awake/alert Behavior During Therapy: Flat affect Overall Cognitive Status: Difficult to assess Area of Impairment: Problem solving, Awareness, Following commands, Attention                   Current Attention Level: Sustained   Following Commands: Follows one step commands with increased time   Awareness: Intellectual Problem Solving: Slow processing, Decreased initiation, Difficulty sequencing, Requires verbal cues, Requires tactile cues General Comments: Pt inconsistently nodding yes/no to questions asked, pt with minimal communication, following commands appropriatley        Exercises      Shoulder Instructions       General Comments VSS on RA    Pertinent Vitals/ Pain       Pain Assessment Pain Assessment: No/denies pain Pain Intervention(s): Monitored during session  Home Living                                          Prior Functioning/Environment              Frequency  Min 2X/week        Progress Toward Goals  OT Goals(current goals can now be found in the care plan section)  Progress towards OT goals: Progressing toward goals  Acute Rehab OT Goals Patient Stated Goal: to return to bed OT Goal Formulation: With patient Time For Goal Achievement: 07/06/22 Potential to Achieve Goals: Good ADL Goals Pt Will Perform Upper Body Dressing: with min guard assist;sitting Pt Will Perform  Lower Body Dressing: with min assist;sitting/lateral leans;sit to/from stand Pt Will Transfer to Toilet: with min assist;squat pivot transfer;stand pivot transfer;bedside commode Additional ADL Goal #1: pt will complete bed mobility ind in prep for ADLs  Plan Discharge plan remains appropriate;Frequency remains appropriate    Co-evaluation                 AM-PAC OT "6 Clicks" Daily Activity     Outcome Measure   Help from another person eating meals?: A Little Help from another person taking care of personal grooming?: A Little Help from another person toileting, which includes using toliet, bedpan, or urinal?: A Lot Help from another person bathing (including washing, rinsing, drying)?: A Lot Help from another person to put on and taking off regular upper body clothing?: A Lot Help from another person to put on and taking off regular lower body clothing?: A Lot 6 Click Score: 14    End of Session Equipment Utilized During Treatment: Gait belt;Rolling walker (2 wheels)  OT Visit Diagnosis: Unsteadiness on feet (R26.81);Other abnormalities of gait and mobility (R26.89);Muscle weakness (generalized) (M62.81);Other symptoms and signs involving cognitive function;Cognitive communication deficit (R41.841)   Activity Tolerance Patient tolerated treatment well   Patient Left in bed;with call bell/phone within reach;with bed alarm set;with nursing/sitter in room;with family/visitor present   Nurse Communication Mobility status  Time: 1510-1539 OT Time Calculation (min): 29 min  Charges: OT General Charges $OT Visit: 1 Visit OT Treatments $Self Care/Home Management : 8-22 mins $Therapeutic Activity: 8-22 mins  Lynnda Child, OTD, OTR/L Acute Rehab [phone] - [place] 06/22/2022, 3:53 PM

## 2022-06-22 NOTE — Progress Notes (Signed)
  Transition of Care [facility]) Screening Note   Patient Details  Name: [name] Date of Birth: 23-Jan-1956   Transition of Care [facility]) CM/SW Contact:    Pollie Friar, RN Phone Number: 06/22/2022, 2:31 PM   Pt is from home alone. CIR has started insurance and family is trying to arrange care needed after a rehab stay.  Transition of Care Department [facility]) has reviewed patient. We will continue to monitor patient advancement through interdisciplinary progression rounds. If new patient transition needs arise, please place a TOC consult.

## 2022-06-22 NOTE — Progress Notes (Signed)
Physical Therapy Treatment Patient Details Name: [name] MRN: [id number] DOB: November 16, 1955 Today's Date: 06/22/2022   History of Present Illness Pt is a 66 y.o. female who presented 06/19/22 with difficulty speaking. Pt found to have acute intraparenchymal hemorrhage centered at the left thalamus with trace localized left-to-right shift. PMH: HTN, numerous strokes in the past both embolic and hemorrhagic currently on ASA and Plavix, migraines, MI, CAD, CKD, HLD, and cardiomyopathy    PT Comments    Pt up in recliner on entry, able to answer questions with increased time and effort. R UE and R LE strength adequate for mobility however pt requires increased attention to be able move and activate muscles. With distraction muscle control is limited. Pt is min A for transfers with maximal cues for safety and sequencing. Pt min-modA for ambulation with RW. Pt with difficulty maintaining R grasp on RW. OT contacted about possibility of walking splint on RW in next session. D/c plan remains appropriate at this time. PT will continue to follow acutely.     Recommendations for follow up therapy are one component of a multi-disciplinary discharge planning process, led by the attending physician.  Recommendations may be updated based on patient status, additional functional criteria and insurance authorization.  Follow Up Recommendations  Acute inpatient rehab (3hours/day)     Assistance Recommended at Discharge Frequent or constant Supervision/Assistance  Patient can return home with the following A lot of help with walking and/or transfers;A lot of help with bathing/dressing/bathroom;Assistance with cooking/housework;Direct supervision/assist for medications management;Direct supervision/assist for financial management;Assist for transportation   Equipment Recommendations  Rolling walker (2 wheels);BSC/3in1;Wheelchair (measurements PT);Wheelchair cushion (measurements PT)    Recommendations for  Other Services Rehab consult     Precautions / Restrictions Precautions Precautions: Fall Precaution Comments: SBP 130-150 Restrictions Weight Bearing Restrictions: No     Mobility  Bed Mobility               General bed mobility comments: up in recliner on entry    Transfers Overall transfer level: Needs assistance Equipment used: 1 person hand held assist Transfers: Sit to/from Stand, Bed to chair/wheelchair/BSC Sit to Stand: Min assist           General transfer comment: light min A on R side for steadying, good power up x2 with ambulation    Ambulation/Gait Ambulation/Gait assistance: Mod assist, Min assist Gait Distance (Feet): 15 Feet (x2) Assistive device: Rolling walker (2 wheels) Gait Pattern/deviations: Step-to pattern, Step-through pattern, Decreased weight shift to right, Decreased stance time - right, Decreased step length - right, Shuffle, Narrow base of support Gait velocity: reduced Gait velocity interpretation: <1.31 ft/sec, indicative of household ambulator   General Gait Details: pt has strength necessary for holding RW, however has to concentrate on grip, along with sequencing steps, however also distracted by people in hallway, when distracted pt with increased R lateral lean and difficulty with keeping hand on walker and advancing Rfoot      Modified Rankin (Stroke Patients Only) Modified Rankin (Stroke Patients Only) Pre-Morbid Rankin Score: Slight disability Modified Rankin: Moderately severe disability     Balance Overall balance assessment: Needs assistance Sitting-balance support: Single extremity supported, Bilateral upper extremity supported, Feet supported Sitting balance-Leahy Scale: Fair Sitting balance - Comments: can sit EOB for dynamic task without LOB   Standing balance support: Bilateral upper extremity supported, During functional activity Standing balance-Leahy Scale: Poor Standing balance comment: reliant on  external support  Cognition Arousal/Alertness: Awake/alert Behavior During Therapy: Flat affect Overall Cognitive Status: Difficult to assess Area of Impairment: Problem solving, Awareness, Following commands, Attention                   Current Attention Level: Sustained   Following Commands: Follows one step commands with increased time   Awareness: Intellectual Problem Solving: Slow processing, Decreased initiation, Difficulty sequencing, Requires verbal cues, Requires tactile cues General Comments: Pt inconsistently nodding yes/no to questions asked, pt with minimal communication, following commands appropriatley           General Comments General comments (skin integrity, edema, etc.): SBP in 150s with ambulation      Pertinent Vitals/Pain Pain Assessment Pain Assessment: No/denies pain Pain Intervention(s): Monitored during session     PT Goals (current goals can now be found in the care plan section) Acute Rehab PT Goals PT Goal Formulation: With patient/family Time For Goal Achievement: 07/05/22 Potential to Achieve Goals: Good Progress towards PT goals: Progressing toward goals    Frequency    Min 4X/week      PT Plan Current plan remains appropriate       AM-PAC PT "6 Clicks" Mobility   Outcome Measure  Help needed turning from your back to your side while in a flat bed without using bedrails?: A Little Help needed moving from lying on your back to sitting on the side of a flat bed without using bedrails?: A Lot Help needed moving to and from a bed to a chair (including a wheelchair)?: A Lot Help needed standing up from a chair using your arms (e.g., wheelchair or bedside chair)?: A Lot Help needed to walk in hospital room?: Total Help needed climbing 3-5 steps with a railing? : Total 6 Click Score: 11    End of Session Equipment Utilized During Treatment: Gait belt;Oxygen Activity Tolerance: Patient  tolerated treatment well Patient left: in chair;with call bell/phone within reach;with chair alarm set;with family/visitor present Nurse Communication: Mobility status PT Visit Diagnosis: Unsteadiness on feet (R26.81);Other abnormalities of gait and mobility (R26.89);Muscle weakness (generalized) (M62.81);Difficulty in walking, not elsewhere classified (R26.2);Other symptoms and signs involving the nervous system (R29.898);Apraxia (R48.2)     Time: 8937-3428 PT Time Calculation (min) (ACUTE ONLY): 24 min  Charges:  $Gait Training: 8-22 mins $Therapeutic Activity: 8-22 mins                     Goerge Mohr B. Migdalia Dk PT, DPT Acute Rehabilitation Services Please use secure chat or  Call Office [phone]    [place] 06/22/2022, 1:31 PM

## 2022-06-22 NOTE — Progress Notes (Addendum)
PROGRESS NOTE    [name] LALANNE  [name]:[id number] DOB: 19-Dec-1955 DOA: 06/19/2022 PCP: Baxter Hire, MD    Brief Narrative:   [name] is a 66 y.o. female with a past medical history of hypertension, multiple previous strokes currently on ASA and Plavix, migraines, myocardial infarction, CAD, CKD, HLD, and cardiomyopathy presented to the hospital with dysarthria.  Of note, and had  numerous strokes in the past both embolic and hemorrhagic. She did have a Zio patch after her last stroke which was negative for atrial fibrillation and she does follow with Dr. Quentin Ore and Christell Faith with [facility] (most recently seen 03/11/2022). She woke up on the morning of 06/19/2022 unable to speak and EMS was called. She went to bed in her usual state of health.  No mention of missing any medication.  Patient was initially admitted to hospital in the intensive care unit by neurology service.  Subsequently, patient has been considered stable and transferred out of the ICU.  At this time patient is awaiting for rehabilitation.    Assessment and Plan:  Principal Problem:   ICH (intracerebral hemorrhage) (HCC) Active Problems:   Essential hypertension   History of stroke   Hypokalemia   History of coronary artery stent placement   Chronic systolic CHF (congestive heart failure) (HCC)   Thalamic hemorrhage (HCC)   Acute left thalamic hemorrhage while on DAPT with plavix and ASA.  Thought to be secondary to hypertensive in etiology.Code Stroke was called in and CT scan of the head showed acute left thalamic hemorrhage with surrounding edema and minor mass effect. No intraventricular extension.  Repeat CT head scan done 06/19/2022 was done with regional edema but no significant mass effect or other complicating features. No new intracranial abnormality. CTA head & neck - Relatively stable size and appearance of acute left thalamic hemorrhage.  MRI of the brain showed no significant interval change. 2D Echo  reviewed with LV ejection fraction of 65 to 70% with grade 1 diastolic dysfunction.  Hemoglobin A1c of 6.2 LDL 90.  Continue to hold aspirin and Plavix.  Patient has been started on heparin for DVT prophylaxis.  At this time awaiting for rehabilitation.  Communicated with neurology at bedside.   Essential hypertension Patient is on metoprolol, Amlodipine at home.  Required Cleviprex drip while in the hospital including labetalol and hydralazine.  Has been transitioned to oral medications.  Off Cleviprex drip  Hyperlipidemia LDL 90, goal < 70.  Continue statins on discharge.   Diabetes type II Uncontrolled Hemoglobin A1c of 6.2.  Not on medications at home.  Currently on sliding scale insulin.  We will continue to monitor  Dysphagia On normal saline at this time.  Has been started on pured diet as per speech therapist.  We will discontinue normal saline   Hypokalemia  Improved after replacement.  Potassium of 3.6 today.  We will continue to replenish.  History of chronic systolic congestive heart failure.  Currently compensated. On aspirin Plavix and Lasix 20 mg daily metoprolol 50 daily and Crestor 40 at home.  Patient is positive balance for 1128 ml.  Will need to reconsider Lasix when p.o. better.   DVT prophylaxis: heparin injection 5,000 Units Start: 06/21/22 1400 SCD's Start: 06/19/22 1523   Code Status:     Code Status: Full Code  Disposition: CIR as per physical therapy recommendation  Status is: Inpatient Distribution Remains inpatient appropriate because: Intracranial hemorrhage, need for rehabilitation   Family Communication: None at bedside  Consultants:  Neurology PCCM  Procedures:  None  Antimicrobials:  None  Anti-infectives (From admission, onward)    None      Subjective: Today, patient was seen and examined at bedside.  Patient is dysarthric but denies any nausea vomiting shortness of breath chest pain or other symptoms.  Objective: Vitals:    06/21/22 2335 06/22/22 0443 06/22/22 0731 06/22/22 1059  BP: (!) 146/73 133/62 (!) 161/73 (!) 151/71  Pulse: 72 72 64 62  Resp: '19 16 20 20  '$ Temp: 98 F (36.7 C) 99.2 F (37.3 C) 98.8 F (37.1 C) 98.2 F (36.8 C)  TempSrc:  Oral Oral Oral  SpO2: 95% 94% 95% 96%  Weight:      Height:        Intake/Output Summary (Last 24 hours) at 06/22/2022 1147 Last data filed at 06/22/2022 0900 Gross per 24 hour  Intake 1240.58 ml  Output --  Net 1240.58 ml   Filed Weights   06/19/22 1500 06/21/22 1837  Weight: 66.2 kg 66.2 kg    Physical Examination: Body mass index is 27.58 kg/m.  General:  Average built, not in obvious distress, dysarthric HENT:   No scleral pallor or icterus noted. Oral mucosa is moist.  Chest:  Clear breath sounds.  Diminished breath sounds bilaterally. No crackles or wheezes.  CVS: S1 &S2 heard. No murmur.  Regular rate and rhythm. Abdomen: Soft, nontender, nondistended.  Bowel sounds are heard.   Extremities: No cyanosis, clubbing or edema.  Peripheral pulses are palpable. Psych: Alert, awake and oriented, normal mood CNS:  No cranial nerve deficits.  Severely dysarthric, able to comprehend and responds appropriately. Skin: Warm and dry.  No rashes noted.  Data Reviewed:   CBC: Recent Labs  Lab 06/19/22 0902 06/21/22 0124  WBC 10.2 12.8*  NEUTROABS 6.7  --   HGB 12.8 13.3  HCT 38.8 39.0  MCV 92.4 91.8  PLT 276 448    Basic Metabolic Panel: Recent Labs  Lab 06/19/22 0902 06/21/22 0124 06/22/22 0647  NA 143 143 143  K 3.5 3.3* 3.6  CL 110 108 113*  CO2 23 24 20*  GLUCOSE 100* 104* 87  BUN '15 18 18  '$ CREATININE 0.86 0.87 0.86  CALCIUM 9.3 9.4 9.0  MG  --   --  1.9    Liver Function Tests: Recent Labs  Lab 06/19/22 0902  AST 25  ALT 24  ALKPHOS 79  BILITOT 0.7  PROT 7.0  ALBUMIN 3.8     Radiology Studies: CT ANGIO HEAD NECK W WO CM  Result Date: 06/21/2022 CLINICAL DATA:  Follow-up exam for hemorrhagic stroke. EXAM: CT  ANGIOGRAPHY HEAD AND NECK TECHNIQUE: Multidetector CT imaging of the head and neck was performed using the standard protocol during bolus administration of intravenous contrast. Multiplanar CT image reconstructions and MIPs were obtained to evaluate the vascular anatomy. Carotid stenosis measurements (when applicable) are obtained utilizing NASCET criteria, using the distal internal carotid diameter as the denominator. RADIATION DOSE REDUCTION: This exam was performed according to the departmental dose-optimization program which includes automated exposure control, adjustment of the mA and/or kV according to patient size and/or use of iterative reconstruction technique. CONTRAST:  174m OMNIPAQUE IOHEXOL 350 MG/ML SOLN COMPARISON:  Prior studies from 06/20/2022 and earlier FINDINGS: CT HEAD FINDINGS Brain: Previously identified left thalamic hemorrhage is not significantly changed in size and morphology as compared to previous. Mild localized edema without significant regional mass effect. No intraventricular extension. No new intracranial hemorrhage or large vessel territory infarct.  Underlying atrophy with chronic microvascular ischemic disease again noted. Few additional scattered remote lacunar infarcts noted, better characterized prior brain MRI. No mass lesion or significant midline shift. No hydrocephalus or extra-axial fluid collection. Vascular: No hyperdense vessel. Skull: Scalp soft tissues and calvarium demonstrate no new finding. Sinuses/Orbits: Left gaze noted. Globes orbital soft tissues demonstrate no other acute finding. Mild scattered mucosal thickening about the sphenoethmoidal sinuses. Mastoid air cells remain largely clear. Other: None. Review of the MIP images confirms the above findings CTA NECK FINDINGS Aortic arch: Visualized aortic arch normal caliber with standard 3 vessel morphology. No stenosis or other abnormality about the orbital great vessels. Right carotid system: Right common and  internal carotid arteries patent without dissection or other acute finding. Mild mixed plaque about the right carotid bulb without hemodynamically significant stenosis. Left carotid system: Left common and internal carotid arteries patent without dissection or other acute finding. Calcified plaque about the left carotid bulb without hemodynamically significant greater than 50% stenosis. Vertebral arteries: Both vertebral arteries arise from the subclavian arteries. No proximal subclavian artery stenosis. Left vertebral artery dominant. Vertebral arteries patent without stenosis or dissection. Skeleton: No discrete or worrisome osseous lesions. Mild spondylosis for age. Other neck: No other acute soft tissue abnormality within the neck. Upper chest: Hazy subsegmental atelectatic changes noted dependently within the visualized lungs. Visualized upper chest demonstrates no other acute finding. Review of the MIP images confirms the above findings CTA HEAD FINDINGS Anterior circulation: Petrous segments patent bilaterally. Mild for age atheromatous change within the carotid siphons without significant stenosis. A1 segments patent bilaterally. Normal anterior communicating complex. Atheromatous irregularity within both ACAs without significant stenosis. No M1 stenosis or occlusion. No proximal MCA branch occlusion. Distal MCA branches perfused and symmetric. Distal small vessel atheromatous irregularity noted. Posterior circulation: Atheromatous irregularity within the V4 segments bilaterally without significant stenosis. Left PICA patent. Right PICA origin not well seen. Basilar patent to its distal aspect without stenosis superior cerebral arteries patent bilaterally. Both PCAs primarily supplied via the basilar. Atheromatous irregularity throughout the PCAs bilaterally. Associated moderate proximal left P2 stenosis (series 14, image 20). No other hemodynamically significant stenosis. Venous sinuses: Patent allowing for  timing the contrast bolus. Anatomic variants: None significant. No intracranial aneurysm. No vascular malformation seen overlying the left thalamic hemorrhage. Review of the MIP images confirms the above findings IMPRESSION: CT HEAD IMPRESSION: 1. Relatively stable size and appearance of acute left thalamic hemorrhage. Mild localized edema without significant regional mass effect. No interval intraventricular extension or other complicating features. 2. No other new acute intracranial abnormality. CTA HEAD AND NECK IMPRESSION: 1. Negative CTA for large vessel occlusion. No vascular abnormality seen underlying the left thalamic hemorrhage. 2. Mild-to-moderate atheromatous change about the major arterial vasculature of the neck. Associated moderate proximal left P2 stenosis. No other hemodynamically significant or correctable stenosis. Electronically Signed   By: Jeannine Boga M.D.   On: 06/21/2022 04:52      LOS: 3 days    Flora Lipps, MD [facility] Available via Epic secure chat 7am-7pm After these hours, please refer to coverage provider listed on [email or web address] 06/22/2022, 11:47 AM

## 2022-06-23 DIAGNOSIS — I610 Nontraumatic intracerebral hemorrhage in hemisphere, subcortical: Secondary | ICD-10-CM | POA: Diagnosis not present

## 2022-06-23 DIAGNOSIS — Z8673 Personal history of transient ischemic attack (TIA), and cerebral infarction without residual deficits: Secondary | ICD-10-CM | POA: Diagnosis not present

## 2022-06-23 DIAGNOSIS — I5022 Chronic systolic (congestive) heart failure: Secondary | ICD-10-CM | POA: Diagnosis not present

## 2022-06-23 DIAGNOSIS — Z955 Presence of coronary angioplasty implant and graft: Secondary | ICD-10-CM | POA: Diagnosis not present

## 2022-06-23 DIAGNOSIS — I10 Essential (primary) hypertension: Secondary | ICD-10-CM | POA: Diagnosis not present

## 2022-06-23 LAB — BASIC METABOLIC PANEL
Anion gap: 9 (ref 5–15)
BUN: 18 mg/dL (ref 8–23)
CO2: 19 mmol/L — ABNORMAL LOW (ref 22–32)
Calcium: 9.3 mg/dL (ref 8.9–10.3)
Chloride: 112 mmol/L — ABNORMAL HIGH (ref 98–111)
Creatinine, Ser: 0.78 mg/dL (ref 0.44–1.00)
GFR, Estimated: >60 mL/min (ref >60)
Glucose, Bld: 90 mg/dL (ref 70–99)
Potassium: 3.7 mmol/L (ref 3.5–5.1)
Sodium: 140 mmol/L (ref 135–145)

## 2022-06-23 MED ORDER — LISINOPRIL 2.5 MG PO TABS
5.0000 mg | ORAL_TABLET | Freq: Every day | ORAL | Status: DC
  Administered 2022-06-23 – 2022-06-26 (×4): 5 mg via ORAL
  Filled 2022-06-23 (×4): qty 2

## 2022-06-23 NOTE — Progress Notes (Signed)
Speech Language Pathology Treatment: Dysphagia  Patient Details Name: [name] MRN: [id number] DOB: November 16, 1955 Today's Date: 06/23/2022 Time: 4098-1191 SLP Time Calculation (min) (ACUTE ONLY): 34 min  Assessment / Plan / Recommendation Clinical Impression  Patient seen for skilled SLP to address dysphagia goals/plan of care.  She was seen sitting up in chair with friend, Ellard Artis, at bedside.  Right facial asymmetry noted and lingual ulceration/abrasion is appearing improved.  She benefited from her friend's encouragement to participate in session.  Noted meal tray with only single sip of nectar liquid consumed - per friend, pt said it was "nasty"- but she does drink sweet tea prior to admission.  SLP offered variable other liquids to consume including cola, lemon lime, etc and provided toothbrush/paste for oral care.  Tongue has white coating on it without pt awareness nor her discomfort - This did not brush off, but also limited brushing by pt noted.   Max cues (along with friend encouragement) to allow SLP to assist with hand over hand to provide neuro input- with friend saying "do it for me".   Observed pt with consumption of single ice chips, nectar thick soda *which she didn't mind*, thin soda and water.  Anterior loss of liquids noted on right/left with thin, right with nectar without adequate awareness.  Pt also tilting her head to the left and posterior to aid oral transiting of boluses.  No indication of aspiration but pt's swallow remains delayed with oral retention of secretions/boluses of which she was not aware, thus recommend allowing TSPS OF THIN and ICE CHIPS - continuing nectar via cup/straw and pureed foods.   Patient able to verbalize yes and bye with max encouragement as she frequently turns away from [place] she will benefit from therapy with family/freinds around to encourage participation.  Encouraged pt to consume liquids as much as able and educated her and her friend to  importance.  Also recommmed pt brush her teeth am/pm.  Provale cup may be useful to allow independence with smaller boluses of thin.  Pt verbalized "bye" with friend encouragement.    HPI HPI: 66 y.o. female admitted with new onset aphasia. She has had numerous strokes in the past, both embolic and hemorrhagic. PMH of hypertension, multiple previous strokes currently on ASA and Plavix, migraines, MI, CAD, CKD, HLD, and cardiomyopathy. Has undergone swallow and speech evaluations multiple times since 2014. Imaging showed intraparenchymal hemorrhage centered at the left thalamus; age-related cerebral atrophy with moderate chronic small vessel ischemic disease, with multiple remote lacunar infarcts around the hemispheric cerebral white matter and deep gray nuclei. Pt has started on a dys1/nectar diet - follow up indicated to assess po tolerance, readiness for dietary advancement.      SLP Plan  Continue with current plan of care      Recommendations for follow up therapy are one component of a multi-disciplinary discharge planning process, led by the attending physician.  Recommendations may be updated based on patient status, additional functional criteria and insurance authorization.    Recommendations  Diet recommendations: Dysphagia 1 (puree);Nectar-thick liquid Liquids provided via: Cup;Straw Medication Administration: Whole meds with puree Supervision: Staff to assist with self feeding Compensations: Minimize environmental distractions;Slow rate;Small sips/bites Postural Changes and/or Swallow Maneuvers: Seated upright 90 degrees;Upright 30-60 min after meal                Oral Care Recommendations: Oral care BID Follow Up Recommendations: Acute inpatient rehab (3hours/day) Assistance recommended at discharge: Frequent or constant Supervision/Assistance  SLP Visit Diagnosis: Dysphagia, oropharyngeal phase (R13.12) Plan: Continue with current plan of care         Kathleen Lime, MS  [place] Office [phone] Pager [phone]   Macario Golds  06/23/2022, 1:44 PM

## 2022-06-23 NOTE — Progress Notes (Signed)
Physical Therapy Treatment Patient Details Name: [name] MRN: [id number] DOB: 1956-10-04 Today's Date: 06/23/2022   History of Present Illness Pt is a 66 y.o. female who presented 06/19/22 with difficulty speaking. Pt found to have acute intraparenchymal hemorrhage centered at the left thalamus with trace localized left-to-right shift. PMH: HTN, numerous strokes in the past both embolic and hemorrhagic currently on ASA and Plavix, migraines, MI, CAD, CKD, HLD, and cardiomyopathy    PT Comments    Pt with improved ambulation ability and tolerance however continues to have severe R sided neglect, R hemiparesis, impaired co-ordination, and impaired balance/increased falls risk. Pt remains to be non-verbal with flat affect but did follow simple commands majority of time. Pt continues to require min/modA for transfers and modA for ambulation with RW. Pt to continue to benefit from AIR upon d/c for maximal functional recovery. Acute PT to cont to follow.    Recommendations for follow up therapy are one component of a multi-disciplinary discharge planning process, led by the attending physician.  Recommendations may be updated based on patient status, additional functional criteria and insurance authorization.  Follow Up Recommendations  Acute inpatient rehab (3hours/day)     Assistance Recommended at Discharge Frequent or constant Supervision/Assistance  Patient can return home with the following A lot of help with walking and/or transfers;A lot of help with bathing/dressing/bathroom;Assistance with cooking/housework;Direct supervision/assist for medications management;Direct supervision/assist for financial management;Assist for transportation   Equipment Recommendations  Rolling walker (2 wheels);BSC/3in1;Wheelchair (measurements PT);Wheelchair cushion (measurements PT)    Recommendations for Other Services Rehab consult     Precautions / Restrictions Precautions Precautions:  Fall Precaution Comments: SBP 130-150 Restrictions Weight Bearing Restrictions: No     Mobility  Bed Mobility Overal bed mobility: Needs Assistance Bed Mobility: Supine to Sit     Supine to sit: Min assist     General bed mobility comments: pt with good initiation however required minA to manage R UE and for trunk elevation, increased time    Transfers Overall transfer level: Needs assistance Equipment used: Rolling walker (2 wheels) (with R hand walker splint) Transfers: Sit to/from Stand, Bed to chair/wheelchair/BSC Sit to Stand: Min assist           General transfer comment: pt impulsive, minA for R UE management onto RW, verbal cues for safety and minA at R LE for stability    Ambulation/Gait Ambulation/Gait assistance: Mod assist, +2 safety/equipment Gait Distance (Feet): 20 Feet (x1, 35x1) Assistive device: Rolling walker (2 wheels) Gait Pattern/deviations: Step-to pattern, Step-through pattern, Decreased weight shift to right, Decreased stance time - right, Decreased step length - right, Narrow base of support Gait velocity: reduced Gait velocity interpretation: <1.8 ft/sec, indicate of risk for recurrent falls   General Gait Details: OT placed a hand guard on the R side of patients walker which assisted with keeping pt's hand on the RW. max tactile and verbal cues for R LE sequencing to increased step height and length and to prevent adduction. Pt unable to self correct to cues requiring minA for optimal placement of R foot. pt with R lateral lean requiring min/modA to bring pt to midline of walker, pt unable to maintain without assist   Stairs             Wheelchair Mobility    Modified Rankin (Stroke Patients Only) Modified Rankin (Stroke Patients Only) Pre-Morbid Rankin Score: Slight disability Modified Rankin: Moderately severe disability     Balance Overall balance assessment: Needs assistance Sitting-balance support: Single  extremity  supported, Bilateral upper extremity supported, Feet supported Sitting balance-Leahy Scale: Good Sitting balance - Comments: pt was able to sit EOB and don bilat socks without LOB   Standing balance support: Bilateral upper extremity supported, During functional activity Standing balance-Leahy Scale: Poor Standing balance comment: reliant on external support                            Cognition Arousal/Alertness: Awake/alert Behavior During Therapy: Flat affect Overall Cognitive Status: Difficult to assess Area of Impairment: Problem solving, Awareness, Following commands, Attention                   Current Attention Level: Sustained   Following Commands: Follows one step commands with increased time   Awareness: Intellectual Problem Solving: Slow processing, Decreased initiation, Difficulty sequencing, Requires verbal cues, Requires tactile cues General Comments: pt with noted R sided neglect however demonstrates improved problem solving skills as pt attempted to don bilat socks in sitting, pt remains non-verbal        Exercises      General Comments General comments (skin integrity, edema, etc.): VSS on RA      Pertinent Vitals/Pain Pain Assessment Pain Assessment: Faces Faces Pain Scale: No hurt    Home Living                          Prior Function            PT Goals (current goals can now be found in the care plan section) Acute Rehab PT Goals PT Goal Formulation: With patient/family Time For Goal Achievement: 07/05/22 Potential to Achieve Goals: Good Progress towards PT goals: Progressing toward goals    Frequency    Min 4X/week      PT Plan Current plan remains appropriate    Co-evaluation              AM-PAC PT "6 Clicks" Mobility   Outcome Measure  Help needed turning from your back to your side while in a flat bed without using bedrails?: A Little Help needed moving from lying on your back to sitting on  the side of a flat bed without using bedrails?: A Lot Help needed moving to and from a bed to a chair (including a wheelchair)?: A Lot Help needed standing up from a chair using your arms (e.g., wheelchair or bedside chair)?: A Lot Help needed to walk in hospital room?: A Lot Help needed climbing 3-5 steps with a railing? : Total 6 Click Score: 12    End of Session Equipment Utilized During Treatment: Gait belt Activity Tolerance: Patient tolerated treatment well Patient left: in chair;with call bell/phone within reach;with chair alarm set Nurse Communication: Mobility status PT Visit Diagnosis: Unsteadiness on feet (R26.81);Other abnormalities of gait and mobility (R26.89);Muscle weakness (generalized) (M62.81);Difficulty in walking, not elsewhere classified (R26.2);Other symptoms and signs involving the nervous system (R29.898);Apraxia (R48.2)     Time: 8889-1694 PT Time Calculation (min) (ACUTE ONLY): 32 min  Charges:  $Gait Training: 8-22 mins $Therapeutic Activity: 8-22 mins                     Kittie Plater, PT, DPT Acute Rehabilitation Services Secure chat preferred Office #: [phone]    Berline Lopes 06/23/2022, 2:09 PM

## 2022-06-23 NOTE — Progress Notes (Signed)
Inpatient Rehab Admissions Coordinator:    I am following for potential CIR admit; however, pt.'s sister states she is not sure she will be able to provide 24/7 supervision upon d/c and would like to know what SNFs are in network with pt.'s insurance. I let TOC know.   Clemens Catholic, [place], [place] Admissions Coordinator  [phone] ([place]) [phone] (office)

## 2022-06-23 NOTE — NC FL2 (Signed)
[place] LEVEL OF CARE SCREENING TOOL     IDENTIFICATION  Patient Name: [name] Birthdate: 1956/02/28 Sex: female Admission Date (Current Location): 06/19/2022  [facility] and [place] Number:  Engineering geologist and Address:  [facility], [place] [address], [place], [place] [address]      Provider Number: [id number]  Attending Physician Name and Address:  Flora Lipps, MD  Relative Name and Phone Number:       Current Level of Care: Hospital Recommended Level of Care: [place] Prior Approval Number:    Date Approved/Denied:   PASRR Number: [id number]  Discharge Plan: SNF    Current Diagnoses: Patient Active Problem List   Diagnosis Date Noted   CVA (cerebral vascular accident) ([place]) 06/30/2021   Thalamic hemorrhage ([place]) 04/10/2021   ICH (intracerebral hemorrhage) ([place]) 04/10/2021   Pelvic pain-right lower quadrant 07/14/2018   Right lower quadrant abdominal pain 06/30/2018   Bilateral carotid artery stenosis 72/62/0355   Chronic systolic CHF (congestive heart failure) ([place]) 03/12/2018   Status post total knee replacement using cement, left 10/06/2016   History of coronary artery stent placement    Pure hypercholesterolemia    Recurrent UTI 02/11/2016   Menopause 02/11/2016   Angina pectoris ([place]) 01/07/2016   Syncope 07/11/2015   Orthostatic hypotension 02/19/2015   Dehydration 02/19/2015   Hypokalemia 02/19/2015   Stroke ([place]) 02/18/2015   Tobacco abuse 02/18/2015   History of stroke 07/18/2014   Vertigo 07/18/2014   Bilateral wrist pain 07/18/2014   Atypical chest pain 06/05/2014   Coronary artery disease of native artery of native heart with stable angina pectoris ([place]) 01/19/2014   Carotid arterial disease ([place]) 01/19/2014   Depression 01/16/2014   Memory loss 01/16/2014   Right sided weakness 08/10/2013   Essential hypertension 08/10/2013   Mixed hyperlipidemia 08/10/2013   History of MI  (myocardial infarction) 08/10/2013   Stroke, acute, embolic ([place]) 97/41/6384    Orientation RESPIRATION BLADDER Height & Weight     Self, Situation, Place  Normal Incontinent Weight: 145 lb 15.1 oz (66.2 kg) Height:  '5\' 1"'$  (154.9 cm)  BEHAVIORAL SYMPTOMS/MOOD NEUROLOGICAL BOWEL NUTRITION STATUS      Continent Diet (see DC summary)  AMBULATORY STATUS COMMUNICATION OF NEEDS Skin   Extensive Assist Verbally Normal                       Personal Care Assistance Level of Assistance  Bathing, Feeding, Dressing Bathing Assistance: Maximum assistance Feeding assistance: Limited assistance Dressing Assistance: Maximum assistance     Functional Limitations Info  Speech     Speech Info: Impaired (expressive aphasia)    SPECIAL CARE FACTORS FREQUENCY  PT (By licensed PT), OT (By licensed OT)     PT Frequency: 5x/wk OT Frequency: 5x/wk            Contractures Contractures Info: Not present    Additional Factors Info  Code Status, Allergies, Psychotropic Code Status Info: Full Allergies Info: Codeine Sulfate, Penicillins Psychotropic Info: Prozac '20mg'$  daily         Current Medications (06/23/2022):  This is the current hospital active medication list Current Facility-Administered Medications  Medication Dose Route Frequency Provider Last Rate Last Admin   acetaminophen (TYLENOL) tablet 650 mg  650 mg Oral Q4H PRN Janine Ores, NP       Or   acetaminophen (TYLENOL) 160 MG/5ML solution 650 mg  650 mg Per Tube Q4H PRN Janine Ores,  NP       Or   acetaminophen (TYLENOL) suppository 650 mg  650 mg Rectal Q4H PRN Janine Ores, NP   650 mg at 06/20/22 1254   amLODipine (NORVASC) tablet 10 mg  10 mg Oral Daily Kerney Elbe, MD   10 mg at 06/23/22 0845   FLUoxetine (PROZAC) capsule 20 mg  20 mg Oral Daily Kerney Elbe, MD   20 mg at 06/23/22 0845   heparin injection 5,000 Units  5,000 Units Subcutaneous Q8H Garvin Fila, MD   5,000 Units at 06/23/22 1210    hydrALAZINE (APRESOLINE) injection 20 mg  20 mg Intravenous Q6H PRN Garvin Fila, MD   20 mg at 06/21/22 1748   labetalol (NORMODYNE) injection 20 mg  20 mg Intravenous Q2H PRN Garvin Fila, MD   20 mg at 06/21/22 1713   lisinopril (ZESTRIL) tablet 5 mg  5 mg Oral Daily Pokhrel, Laxman, MD   5 mg at 06/23/22 1210   metoprolol tartrate (LOPRESSOR) tablet 25 mg  25 mg Oral BID Garvin Fila, MD   25 mg at 06/23/22 0845   pantoprazole (PROTONIX) 2 mg/mL oral suspension 40 mg  40 mg Oral QHS Garvin Fila, MD   40 mg at 06/22/22 2109   rosuvastatin (CRESTOR) tablet 40 mg  40 mg Oral QHS Garvin Fila, MD   40 mg at 06/22/22 2109   senna-docusate (Senokot-S) tablet 1 tablet  1 tablet Oral BID Janine Ores, NP   1 tablet at 06/23/22 0845     Discharge Medications: Please see discharge summary for a list of discharge medications.  Relevant Imaging Results:  Relevant Lab Results:   Additional Information SS#: [id number]  Geralynn Ochs, LCSW

## 2022-06-23 NOTE — TOC Initial Note (Signed)
Transition of Care [facility]) - Initial/Assessment Note    Patient Details  Name: [name] MRN: [id number] Date of Birth: 1956/06/22  Transition of Care [facility]) CM/SW Contact:    Coralee Pesa, [place] Phone Number: 06/23/2022, 2:10 PM  Clinical Narrative:                 CSW was ntified pt's sister had questions regarding the SNF process and was deciding between CIR and SNF. CSW spoke with pt's insurance plan, and pt is in network with one facility, but it is private, so not an option. They did note they would work with a facility to get the pt approved for in network benefits. CSW got verbal permission from pt to speak with sister Florentina Jenny regarding her DC plans. Florentina Jenny stated that pt had been to [facility] previously, and that pt liked it. Florentina Jenny is agreeable to CSW faxing pt out to [facility], [facility], [facility], and [facility]. She is still undecided on DC plan, but did discuss the problems with 24 hour care that CIR requires.  CSW noted that the referrals would be faxed and any acceptances will be provided, which may help family decide. CSW updated CIR and primary CSW to barriers. TOC will continue to follow for DC needs.  Expected Discharge Plan: Skilled Nursing Facility Barriers to Discharge: Continued Medical Work up, SNF Pending bed offer, Insurance Authorization   Patient Goals and CMS Choice Patient states their goals for this hospitalization and ongoing recovery are:: Pt states she would like to be able to get better. CMS Medicare.gov Compare Post Acute Care list provided to:: Patient Represenative (must comment) Florentina Jenny, sister) Choice offered to / list presented to : Patient, Sibling  Expected Discharge Plan and Services Expected Discharge Plan: [place] Acute Care Choice: [place] Living arrangements for the past 2 months: Single Family Home                                      Prior Living Arrangements/Services Living arrangements  for the past 2 months: Single Family Home Lives with:: Siblings Patient language and need for interpreter reviewed:: Yes Do you feel safe going back to the place where you live?: Yes      Need for Family Participation in Patient Care: Yes (Comment) Care giver support system in place?: Yes (comment)   Criminal Activity/Legal Involvement Pertinent to Current Situation/Hospitalization: No - Comment as needed  Activities of Daily Living Home Assistive Devices/Equipment: None ADL Screening (condition at time of admission) Patient's cognitive ability adequate to safely complete daily activities?: Yes Is the patient deaf or have difficulty hearing?: No Does the patient have difficulty seeing, even when wearing glasses/contacts?: No Does the patient have difficulty concentrating, remembering, or making decisions?: No Patient able to express need for assistance with ADLs?: Yes Does the patient have difficulty dressing or bathing?: Yes Independently performs ADLs?: No Communication: Needs assistance Is this a change from baseline?: Change from baseline, expected to last <3 days Dressing (OT): Needs assistance Is this a change from baseline?: Change from baseline, expected to last <3days Feeding: Independent Bathing: Needs assistance Is this a change from baseline?: Change from baseline, expected to last <3 days In/Out Bed: Needs assistance Is this a change from baseline?: Change from baseline, expected to last <3 days Walks in Home: Needs assistance Is this a change from baseline?: Change  from baseline, expected to last <3 days Does the patient have difficulty walking or climbing stairs?: No Weakness of Legs: Both Weakness of Arms/Hands: Both  Permission Sought/Granted Permission sought to share information with : Family Supports Permission granted to share information with : Yes, Verbal Permission Granted  Share Information with NAME: Florentina Jenny     Permission granted to share info w  Relationship: Sister  Permission granted to share info w Contact Information: [phone]  Emotional Assessment Appearance:: Appears stated age Attitude/Demeanor/Rapport: Engaged Affect (typically observed): Appropriate Orientation: : Oriented to Self, Oriented to Place, Oriented to Situation Alcohol / Substance Use: Not Applicable Psych Involvement: No (comment)  Admission diagnosis:  ICH (intracerebral hemorrhage) ([place]) [I61.9] Patient Active Problem List   Diagnosis Date Noted   CVA (cerebral vascular accident) ([place]) 06/30/2021   Thalamic hemorrhage ([place]) 04/10/2021   ICH (intracerebral hemorrhage) ([place]) 04/10/2021   Pelvic pain-right lower quadrant 07/14/2018   Right lower quadrant abdominal pain 06/30/2018   Bilateral carotid artery stenosis 91/50/5697   Chronic systolic CHF (congestive heart failure) ([place]) 03/12/2018   Status post total knee replacement using cement, left 10/06/2016   History of coronary artery stent placement    Pure hypercholesterolemia    Recurrent UTI 02/11/2016   Menopause 02/11/2016   Angina pectoris ([place]) 01/07/2016   Syncope 07/11/2015   Orthostatic hypotension 02/19/2015   Dehydration 02/19/2015   Hypokalemia 02/19/2015   Stroke ([place]) 02/18/2015   Tobacco abuse 02/18/2015   History of stroke 07/18/2014   Vertigo 07/18/2014   Bilateral wrist pain 07/18/2014   Atypical chest pain 06/05/2014   Coronary artery disease of native artery of native heart with stable angina pectoris ([place]) 01/19/2014   Carotid arterial disease ([place]) 01/19/2014   Depression 01/16/2014   Memory loss 01/16/2014   Right sided weakness 08/10/2013   Essential hypertension 08/10/2013   Mixed hyperlipidemia 08/10/2013   History of MI (myocardial infarction) 08/10/2013   Stroke, acute, embolic ([place]) 94/80/1655   PCP:  Baxter Hire, MD Pharmacy:   [id number] [address], [address] AT [address] [place] [address] Phone: [phone] Fax: [phone]  [facility]) - [place], [address] [place] [address] Phone: [phone] Fax: [phone]  [id number] - [place], [place] - [address] AT [facility] [place] [address] [place] [address] Phone: [phone] Fax: [phone]     Social Determinants of Health (SDOH) Interventions    Readmission Risk Interventions     No data to display

## 2022-06-23 NOTE — TOC CAGE-AID Note (Signed)
Transition of Care [facility]) - CAGE-AID Screening   Patient Details  Name: [name] MRN: [id number] Date of Birth: 1956-02-03  Transition of Care [facility]) CM/SW Contact:    Coralee Pesa, [place] Phone Number: 06/23/2022, 1:44 PM   Clinical Narrative: Pt answered no to all CAGE- AID questions, Substance Use resources declined at this time.   CAGE-AID Screening:    Have You Ever Felt You Ought to Cut Down on Your Drinking or Drug Use?: No Have People Annoyed You By Critizing Your Drinking Or Drug Use?: No Have You Felt Bad Or Guilty About Your Drinking Or Drug Use?: No Have You Ever Had a Drink or Used Drugs First Thing In The Morning to Steady Your Nerves or to Get Rid of a Hangover?: No CAGE-AID Score: 0  Substance Abuse Education Offered: No

## 2022-06-23 NOTE — Progress Notes (Addendum)
STROKE TEAM PROGRESS NOTE    INTERVAL HISTORY  No family at the bedside, plan for rehabilitation disposition.   She is alert and sitting in the chair at bedside after working with PT this morning. Still with expressive > receptive aphasia and delayed responses. Follows commands with right-sided weakness, distal weaker than proximal.  Minimal improvement in right lower extremity weakness on exam. Neurologic exam stable, VSS stable.   Per speech: diet dysphagia with nectar-thick liquid. Staff assist with self feeding. Plan to discharge to CIR.   Vitals:   06/22/22 1542 06/22/22 2058 06/23/22 0344 06/23/22 0712  BP: (!) 153/80 (!) 146/77 (!) 158/77 (!) 161/81  Pulse: 63 72 (!) 57 (!) 56  Resp: '12 18 18 14  '$ Temp: 98.6 F (37 C) 98.7 F (37.1 C) 98.3 F (36.8 C) 98.8 F (37.1 C)  TempSrc: Oral Oral  Oral  SpO2: 95% 96% 92% 94%  Weight:      Height:       CBC:  Recent Labs  Lab 06/19/22 0902 06/21/22 0124  WBC 10.2 12.8*  NEUTROABS 6.7  --   HGB 12.8 13.3  HCT 38.8 39.0  MCV 92.4 91.8  PLT 276 161   Basic Metabolic Panel:  Recent Labs  Lab 06/22/22 0647 06/23/22 0418  NA 143 140  K 3.6 3.7  CL 113* 112*  CO2 20* 19*  GLUCOSE 87 90  BUN 18 18  CREATININE 0.86 0.78  CALCIUM 9.0 9.3  MG 1.9  --    Lipid Panel:  Recent Labs  Lab 06/20/22 0937  CHOL 174  TRIG 206*  HDL 58  CHOLHDL 3.0  VLDL 41*  LDLCALC 75   HgbA1c:  Recent Labs  Lab 06/20/22 0937  HGBA1C 6.0*   Urine Drug Screen:  Recent Labs  Lab 06/19/22 1030  LABOPIA NONE DETECTED  COCAINSCRNUR NONE DETECTED  LABBENZ NONE DETECTED  AMPHETMU NONE DETECTED  THCU NONE DETECTED  LABBARB NONE DETECTED   Alcohol Level  Recent Labs  Lab 06/19/22 0902  ETH <10   IMAGING past 24 hours No results found.  PHYSICAL EXAM  Temp:  [98.2 F (36.8 C)-98.8 F (37.1 C)] 98.8 F (37.1 C) (08/15 0712) Pulse Rate:  [56-72] 56 (08/15 0712) Resp:  [12-20] 14 (08/15 0712) BP: (146-161)/(71-81) 161/81  (08/15 0712) SpO2:  [92 %-96 %] 94 % (08/15 0712)  General - Well nourished, well developed middle-aged Caucasian lady, in no apparent distress.  Cardiovascular - Regular rhythm and rate.  Mental Status -  Alert, eyes open tracking examiner and able to focus as well. Oriented to person and place.  Expressive> receptive aphasia.  Speaks only a few words. She is nonfluent not able to speak sentences.  Able to follow simple one step commands.  Cranial Nerves II - XII - II - Visual field intact OU. III, IV, VI - Extraocular movements intact. V - Facial sensation intact bilaterally. VII - Facial movement intact bilaterally. VIII - Hearing & vestibular intact bilaterally. X - Palate elevates symmetrically. XI - Chin turning & shoulder shrug intact bilaterally. XII - Tongue protrusion intact.  Motor Strength -  RUE proximal weakness with weak grip strength and decreased motor movement of the right hand. Minimal to no movement of the right hand distal to the right wrist.  Mild RLE weakness present, improvement since onset. Left UE and LE antigravity.   Motor Tone - Muscle tone was assessed at the neck and appendages and was normal.  Sensory - Light touch  intact and symmetrical.    Coordination - Timpaired coordination RUE, unable to perform RAMs or finger to thumb  Gait and Station - deferred.   ASSESSMENT/PLAN [name] is a 66 y.o. female with a PMH of hypertension, multiple previous strokes currently on ASA and Plavix, migraines, MI, CAD, CKD, HLD, and cardiomyopathy presenting with difficulty speaking. She has had numerous strokes in the past both embolic and hemorrhagic. She did have a Zio patch after her last stroke which was negative for atrial fibrillation and she does follow with Dr. Quentin [name] and [name] with [facility] (most recently seen 03/11/2022). She woke up on the morning of 06/19/2022 unable to speak and EMS was called. She went to bed in her usual state of health. She denies  missing medications. Of note, she does take ASA, Plavix, rosuvastatin, furosemide, Norvasc, and Toprol-XL. [facility]viprex used at [facility] for BP management, continued at [facility].   Stroke: Acute left thalamic hemorrhage while on DAPT with plavix and ASA for stroke prevention which was likely hypertensive in etiology.  Code Stroke CT head: Acute left thalamic hemorrhage with surrounding edema and minor mass effect. No intraventricular extension Repeat Head CT 8/11: Small 2 mL left thalamic hemorrhage, blood products minimally increased since 0853 hours yesterday. Regional edema but no significant mass effect or other complicating features. No new intracranial abnormality. CTA head & neck - Relatively stable size and appearance of acute left thalamic hemorrhage. Mild localized edema without significant regional mass effect. No interval intraventricular extension or other complicating features. No other new acute intracranial abnormality. MRI: No significant interval change in size and morphology of acute intraparenchymal hemorrhage centered at the left thalamus, estimated volume 1.4 mL. Surrounding vasogenic edema with trace localized left-to-right shift. No intraventricular extension. No visible underlying lesion at this location.No other acute intracranial abnormality. Age-related cerebral atrophy with moderate chronic small vessel ischemic disease, with multiple remote lacunar infarcts about the hemispheric cerebral white matter and deep gray nuclei. 2D Echo EF 65-70%, Mild LVH, Grade 1 diastolic dysfunction, No thrombus or shunt detected LDL 90 HgbA1c 6.2 VTE prophylaxis - SCDs    Diet   DIET - DYS 1 Room service appropriate? Yes with Assist; Fluid consistency: Nectar Thick   On ASA 81 mg and Plavix '75mg'$  prior to admission.  Plan to hold them due to intracerebral hemorrhage and may consider adding aspirin at discharge No antiplatelet or anticoagulant medications at present Therapy recommendations:   CIR Disposition:  TBD  Hypertension Home meds:  Metoprolol, Amlodipine,  Unstable, currently requiring cleviprex drip with labetolol and hydralazine prn as she is currently NPO without access for po meds. Wean cleviprex as tolerated.  BP goal<160  Hyperlipidemia Home meds: Crestor resumed in hospital LDL 90, goal < 70 On high intensity statin at home Continue at discharge  Diabetes type II Controlled Home meds: None HgbA1c 6.0, goal < 7.0 CBGs No results for input(s): "GLUCAP" in the last 72 hours. SSI as needed  Dysphagia SLP recommends dysphagia 1 diet with puree and nectar-thick liquids and staff assist with feeding  Monitor BMP  Hypokalemia  K 3.3 s/p replacement -> 3.6 -> 3.7  Other Stroke Risk Factors Advanced Age >/= 61  Former Cigarette smoker Hx stroke Family hx stroke (mother) Coronary artery disease Migraines  Hospital day # 4 This patient was seen and evaluated with Dr. Leonie Man. He directed the plan of care.  -- Anibal Henderson, AGACNP-BC [facility] [phone]   I have personally obtained history,examined this patient, reviewed notes,  independently viewed imaging studies, participated in medical decision making and plan of care.ROS completed by me personally and pertinent positives fully documented  I have made any additions or clarifications directly to the above note. Agree with note above.  Continue ongoing physical occupational therapy and transfer to inpatient rehab when bed available.  Stroke team will sign off.  Follow-up with an outpatient the stroke clinic in 2 months kindly call for questions.  Antony Contras, MD Medical Director [facility] Center Pager: [phone] 06/23/2022 2:42 PM  To contact Stroke Continuity provider, please refer to [email or web address] After hours, contact General Neurology

## 2022-06-23 NOTE — Progress Notes (Signed)
PROGRESS NOTE    [name]  UXN:[id number] DOB: [birth year] DOA: 06/19/2022 PCP: Baxter Hire, MD    Brief Narrative:   [name] is a 66 y.o. female with a past medical history of hypertension, multiple previous strokes currently on ASA and Plavix, migraines, myocardial infarction, CAD, CKD, HLD, and cardiomyopathy presented to the hospital with dysarthria.  Of note, and had  numerous strokes in the past both embolic and hemorrhagic. She did have a Zio patch after her last stroke which was negative for atrial fibrillation and she does follow with Dr. Quentin Ore and Christell Faith with [facility] (most recently seen 03/11/2022). She woke up on the morning of 06/19/2022 unable to speak and EMS was called. She went to bed in her usual state of health.  No mention of missing any medication.  Patient was initially admitted to hospital in the intensive care unit by neurology service.  Subsequently, patient has been considered stable and transferred out of the ICU.  At this time, patient is awaiting for inpatient rehabilitation.    Assessment and Plan:  Principal Problem:   ICH (intracerebral hemorrhage) (HCC) Active Problems:   Essential hypertension   History of stroke   Hypokalemia   History of coronary artery stent placement   Chronic systolic CHF (congestive heart failure) (HCC)   Thalamic hemorrhage (HCC)   Acute left thalamic hemorrhage while on DAPT with plavix and ASA.  Thought to be secondary to hypertensive in etiology. Code Stroke was called in and CT scan of the head showed acute left thalamic hemorrhage with surrounding edema and minor mass effect. No intraventricular extension.  Repeat CT head scan done 06/19/2022 was done with regional edema but no significant mass effect or other complicating features. No new intracranial abnormality. CTA head & neck - Relatively stable size and appearance of acute left thalamic hemorrhage.  MRI of the brain showed no significant interval change.  2D Echo reviewed with LV ejection fraction of 65 to 70% with grade 1 diastolic dysfunction.  Hemoglobin A1c of 6.2, LDL 90.  Continue to hold aspirin and Plavix.  Patient has been started on heparin for DVT prophylaxis.  At this time awaiting for inpatient rehabilitation.     Essential hypertension Patient is on metoprolol, Amlodipine at home.  Required Cleviprex drip while in the hospital including labetalol and hydralazine.  Has been transitioned to oral medications at this time.  Currently on metoprolol 25 twice daily and amlodipine 10.  Blood pressure 161/ 81.  We will add lisinopril 5 mg daily for better hypertension control.  Hyperlipidemia LDL 90, goal < 70.  Continue statins on discharge.   Diabetes type II Uncontrolled Hemoglobin A1c of 6.2.  Not on medications at home.  Currently on sliding scale insulin.  We will continue to monitor.  Dysphagia  Has been started on pured diet as per speech therapist.    Hypokalemia  Improved after replacement.  Potassium was 3.7.  History of chronic systolic congestive heart failure.   Currently compensated. On aspirin Plavix and Lasix 20 mg daily metoprolol 50 daily and Crestor 40 mg at home.  Patient is positive balance for 1608 ml.  Will need to reconsider Lasix when p.o. better 1 to 2 days.   DVT prophylaxis: heparin injection 5,000 Units Start: 06/21/22 1400 SCD's Start: 06/19/22 1523   Code Status:     Code Status: Full Code  Disposition: CIR as per physical therapy recommendation  Status is: Inpatient  Remains inpatient appropriate because: Intracranial  hemorrhage, need for rehabilitation.  Patient appears to be medically stable for disposition.   Family Communication:  Spoke with the patient's sister on the phone and updated her about the clinical condition of the patient.   Consultants:  Neurology PCCM  Procedures:  None  Antimicrobials:  None  Anti-infectives (From admission, onward)    None       Subjective: Today, patient was seen and examined at bedside.  Patient is dysarthric but denies any nausea vomiting shortness of breath chest pain or other symptoms.  Objective: Vitals:   06/22/22 1542 06/22/22 2058 06/23/22 0344 06/23/22 0712  BP: (!) 153/80 (!) 146/77 (!) 158/77 (!) 161/81  Pulse: 63 72 (!) 57 (!) 56  Resp: '12 18 18 14  '$ Temp: 98.6 F (37 C) 98.7 F (37.1 C) 98.3 F (36.8 C) 98.8 F (37.1 C)  TempSrc: Oral Oral  Oral  SpO2: 95% 96% 92% 94%  Weight:      Height:        Intake/Output Summary (Last 24 hours) at 06/23/2022 1113 Last data filed at 06/23/2022 0845 Gross per 24 hour  Intake 480 ml  Output --  Net 480 ml    Filed Weights   06/19/22 1500 06/21/22 1837  Weight: 66.2 kg 66.2 kg    Physical Examination: Body mass index is 27.58 kg/m.   General:  Average built, not in obvious distress, dysarthric HENT:   No scleral pallor or icterus noted. Oral mucosa is moist.  Chest:  Clear breath sounds.  Diminished breath sounds bilaterally. No crackles or wheezes.  CVS: S1 &S2 heard. No murmur.  Regular rate and rhythm. Abdomen: Soft, nontender, nondistended.  Bowel sounds are heard.   Extremities: No cyanosis, clubbing or edema.  Peripheral pulses are palpable. Psych: Alert, awake and peers to be oriented to place time and comprehension is intact but dysarthric, mildly anxious CNS: Expressive dysarthria.  Moving extremities. Skin: Warm and dry.  No rashes noted.   Data Reviewed:   CBC: Recent Labs  Lab 06/19/22 0902 06/21/22 0124  WBC 10.2 12.8*  NEUTROABS 6.7  --   HGB 12.8 13.3  HCT 38.8 39.0  MCV 92.4 91.8  PLT 276 270     Basic Metabolic Panel: Recent Labs  Lab 06/19/22 0902 06/21/22 0124 06/22/22 0647 06/23/22 0418  NA 143 143 143 140  K 3.5 3.3* 3.6 3.7  CL 110 108 113* 112*  CO2 23 24 20* 19*  GLUCOSE 100* 104* 87 90  BUN '15 18 18 18  '$ CREATININE 0.86 0.87 0.86 0.78  CALCIUM 9.3 9.4 9.0 9.3  MG  --   --  1.9  --       Liver Function Tests: Recent Labs  Lab 06/19/22 0902  AST 25  ALT 24  ALKPHOS 79  BILITOT 0.7  PROT 7.0  ALBUMIN 3.8    Radiology Studies: No results found.    LOS: 4 days    Flora Lipps, MD [facility] Available via Epic secure chat 7am-7pm After these hours, please refer to coverage provider listed on [email or web address] 06/23/2022, 11:13 AM

## 2022-06-24 DIAGNOSIS — Z955 Presence of coronary angioplasty implant and graft: Secondary | ICD-10-CM | POA: Diagnosis not present

## 2022-06-24 DIAGNOSIS — I10 Essential (primary) hypertension: Secondary | ICD-10-CM | POA: Diagnosis not present

## 2022-06-24 DIAGNOSIS — E876 Hypokalemia: Secondary | ICD-10-CM | POA: Diagnosis not present

## 2022-06-24 DIAGNOSIS — R471 Dysarthria and anarthria: Secondary | ICD-10-CM

## 2022-06-24 DIAGNOSIS — I5022 Chronic systolic (congestive) heart failure: Secondary | ICD-10-CM | POA: Diagnosis not present

## 2022-06-24 DIAGNOSIS — Z8673 Personal history of transient ischemic attack (TIA), and cerebral infarction without residual deficits: Secondary | ICD-10-CM | POA: Diagnosis not present

## 2022-06-24 DIAGNOSIS — I610 Nontraumatic intracerebral hemorrhage in hemisphere, subcortical: Secondary | ICD-10-CM | POA: Diagnosis not present

## 2022-06-24 NOTE — Progress Notes (Addendum)
PROGRESS NOTE    [name]  JFH:[id number] DOB: [birth year] DOA: 06/19/2022 PCP: Baxter Hire, MD    Brief Narrative:   [name] is a 66 y.o. female with a past medical history of hypertension, multiple previous strokes currently on ASA and Plavix, migraines, myocardial infarction, CAD, CKD, HLD, and cardiomyopathy presented to the hospital with dysarthria.  Of note, and had  numerous strokes in the past both embolic and hemorrhagic. She did have a Zio patch after her last stroke which was negative for atrial fibrillation and she does follow with Dr. Quentin Ore and Christell Faith with [facility] (most recently seen 03/11/2022). She woke up on the morning of 06/19/2022 unable to speak and EMS was called. She went to bed in her usual state of health.  No mention of missing any medication.  Patient was initially admitted to hospital in the intensive care unit by neurology service.  Subsequently, patient has been considered stable and transferred out of the ICU.  At this time, patient is awaiting for inpatient rehabilitation/skilled nursing facility.   Assessment and Plan:  Principal Problem:   ICH (intracerebral hemorrhage) (HCC) Active Problems:   Essential hypertension   History of stroke   Hypokalemia   History of coronary artery stent placement   Chronic systolic CHF (congestive heart failure) (HCC)   Thalamic hemorrhage (HCC)  Acute left thalamic hemorrhage with dysarthria while on DAPT with plavix and ASA.  Thought to be secondary to hypertensive in etiology. Code Stroke was called in and CT scan of the head showed acute left thalamic hemorrhage with surrounding edema and minor mass effect.  Follow-up CT scan and MRIs were done during hospitalization.  2D Echo reviewed with LV ejection fraction of 65 to 70% with grade 1 diastolic dysfunction.  Hemoglobin A1c of 6.2, LDL 90.  Continue to hold aspirin and Plavix.  Patient has been started on heparin for DVT prophylaxis.  At this time  awaiting for inpatient rehabilitation/skilled nursing facility.[facility]  Heart rate slightly improved  Mild acute metabolic encephalopathy.  Resolved.   Essential hypertension Patient is on metoprolol, Amlodipine at home.  Required Cleviprex drip while in the ICU.  Has been transitioned to oral medication and lisinopril has been added for better blood pressure control  Hyperlipidemia LDL 90, goal < 70.  Continue statins on discharge.   Diabetes type II Uncontrolled Hemoglobin A1c of 6.2.  Not on medications at home.  Currently on sliding scale insulin.  We will continue to monitor.  Latest glucose level of 90  Dysphagia Seen by speech therapy and on dysphagia 1 diet.   Hypokalemia  Replenished.  Latest potassium of 3.7.  History of chronic systolic congestive heart failure.   Currently compensated. On aspirin Plavix Lasix, metoprolol and Crestor as outpatient.  Patient is positive balance for 1058 mL.  Still holding on Lasix since patient is only on pured.  CKD stage II.  Continue to monitor BMP.  Stable at this time.   DVT prophylaxis: heparin injection 5,000 Units Start: 06/21/22 1400 SCD's Start: 06/19/22 1523   Code Status:     Code Status: Full Code  Disposition: CIR as per physical therapy recommendation but also working on skilled nursing facility option.  Status is: Inpatient  Remains inpatient appropriate because: Intracranial hemorrhage, need for rehabilitation.  Patient appears to be medically stable for disposition.   Family Communication:  Spoke with the patient's sister on the phone on 06/23/2022.  Consultants:  Neurology PCCM  Procedures:  None  Antimicrobials:  None  Anti-infectives (From admission, onward)    None      Subjective: Today, patient was seen and examined at bedside.  Denies any pain, nausea, vomiting, headache, fever or chills.  Was able to articulate more today answering few questions.  Objective: Vitals:   06/23/22 2007 06/24/22  0013 06/24/22 0515 06/24/22 0736  BP: (!) 160/78 (!) 143/67 (!) 155/75 136/75  Pulse: 60 (!) 59 (!) 59 60  Resp: '18 16 18 18  '$ Temp: 98.7 F (37.1 C) 99.1 F (37.3 C) 98.4 F (36.9 C) 98.1 F (36.7 C)  TempSrc: Oral Oral Oral Oral  SpO2: 94% 93% 93% 95%  Weight:      Height:        Intake/Output Summary (Last 24 hours) at 06/24/2022 0958 Last data filed at 06/23/2022 1700 Gross per 24 hour  Intake 150 ml  Output 700 ml  Net -550 ml    Filed Weights   06/19/22 1500 06/21/22 1837  Weight: 66.2 kg 66.2 kg    Physical Examination: Body mass index is 27.58 kg/m.   General:  Average built, not in obvious distress, dysarthric but improved speech HENT:   No scleral pallor or icterus noted. Oral mucosa is moist.  Chest:  Clear breath sounds.  Diminished breath sounds bilaterally. No crackles or wheezes.  CVS: S1 &S2 heard. No murmur.  Regular rate and rhythm. Abdomen: Soft, nontender, nondistended.  Bowel sounds are heard.   Extremities: No cyanosis, clubbing or edema.  Peripheral pulses are palpable. Psych: Alert, awake and oriented,  CNS: Improved speech, dysarthria.  Moves all extremities. Skin: Warm and dry.  No rashes noted.   Data Reviewed:   CBC: Recent Labs  Lab 06/19/22 0902 06/21/22 0124  WBC 10.2 12.8*  NEUTROABS 6.7  --   HGB 12.8 13.3  HCT 38.8 39.0  MCV 92.4 91.8  PLT 276 270     Basic Metabolic Panel: Recent Labs  Lab 06/19/22 0902 06/21/22 0124 06/22/22 0647 06/23/22 0418  NA 143 143 143 140  K 3.5 3.3* 3.6 3.7  CL 110 108 113* 112*  CO2 23 24 20* 19*  GLUCOSE 100* 104* 87 90  BUN '15 18 18 18  '$ CREATININE 0.86 0.87 0.86 0.78  CALCIUM 9.3 9.4 9.0 9.3  MG  --   --  1.9  --      Liver Function Tests: Recent Labs  Lab 06/19/22 0902  AST 25  ALT 24  ALKPHOS 79  BILITOT 0.7  PROT 7.0  ALBUMIN 3.8    Radiology Studies: No results found.    LOS: 5 days    Flora Lipps, MD [facility] Available via Epic secure chat  7am-7pm After these hours, please refer to coverage provider listed on [email or web address] 06/24/2022, 9:58 AM

## 2022-06-24 NOTE — Progress Notes (Signed)
Physical Therapy Treatment Patient Details Name: [name] MRN: [id number] DOB: [birth year] Today's Date: 06/24/2022   History of Present Illness Pt is a 66 y.o. female who presented 06/19/22 with difficulty speaking. Pt found to have acute intraparenchymal hemorrhage centered at the left thalamus with trace localized left-to-right shift. PMH: HTN, numerous strokes in the past both embolic and hemorrhagic currently on ASA and Plavix, migraines, MI, CAD, CKD, HLD, and cardiomyopathy    PT Comments    Pt with increased acknowledgement of R side however remains to grossly have inattention. Pt continues with impaired sequencing, processing, and motor planning of R UE and LE during ambulation and for transfers. Pt with flat affect and minimal verbal communication but did nod head yes/no appropriately and attempt to vocalize some of the time. Pt to strongly benefit from AIR upon d/c for maximal functional recovery. Acute PT to continue to follow.    Recommendations for follow up therapy are one component of a multi-disciplinary discharge planning process, led by the attending physician.  Recommendations may be updated based on patient status, additional functional criteria and insurance authorization.  Follow Up Recommendations  Acute inpatient rehab (3hours/day)     Assistance Recommended at Discharge Frequent or constant Supervision/Assistance  Patient can return home with the following A lot of help with walking and/or transfers;A lot of help with bathing/dressing/bathroom;Assistance with cooking/housework;Direct supervision/assist for medications management;Direct supervision/assist for financial management;Assist for transportation   Equipment Recommendations  Rolling walker (2 wheels);BSC/3in1;Wheelchair (measurements PT);Wheelchair cushion (measurements PT)    Recommendations for Other Services Rehab consult     Precautions / Restrictions Precautions Precautions: Fall Precaution  Comments: SBP 130-150 Restrictions Weight Bearing Restrictions: No     Mobility  Bed Mobility Overal bed mobility: Needs Assistance Bed Mobility: Supine to Sit     Supine to sit: Min assist     General bed mobility comments: pt with good initiation however required minA to manage R UE and for trunk elevation, increased time    Transfers Overall transfer level: Needs assistance Equipment used: Rolling walker (2 wheels) (with R hand walker splint) Transfers: Sit to/from Stand, Bed to chair/wheelchair/BSC Sit to Stand: Min assist           General transfer comment: minA for R UE management onto RW, verbal cues for safety and minA at R LE for stability, worked on sit to stands with increased weight shfit on R side and no use of L UE to assist with stand, x 5 reps    Ambulation/Gait Ambulation/Gait assistance: Mod assist, +2 safety/equipment Gait Distance (Feet): 20 Feet (x2) Assistive device: Rolling walker (2 wheels) Gait Pattern/deviations: Step-to pattern, Step-through pattern, Decreased weight shift to right, Decreased stance time - right, Decreased step length - right, Narrow base of support Gait velocity: reduced     General Gait Details: OT placed a hand guard on the R side of patients walker which assisted with keeping pt's hand on the RW. max tactile and verbal cues for R LE sequencing to increased step height and length and to prevent adduction. Pt unable to self correct to cues requiring minA for optimal placement of R foot. pt with R lateral lean requiring min/modA to bring pt to midline of walker, pt unable to maintain without assist   Stairs             Wheelchair Mobility    Modified Rankin (Stroke Patients Only) Modified Rankin (Stroke Patients Only) Pre-Morbid Rankin Score: Slight disability Modified Rankin: Moderately severe disability  Balance Overall balance assessment: Needs assistance Sitting-balance support: Single extremity supported,  Bilateral upper extremity supported, Feet supported Sitting balance-Leahy Scale: Good Sitting balance - Comments: pt with posterior lean today Postural control: Posterior lean, Right lateral lean Standing balance support: Bilateral upper extremity supported, During functional activity Standing balance-Leahy Scale: Poor Standing balance comment: reliant on external support                            Cognition Arousal/Alertness: Awake/alert Behavior During Therapy: Flat affect Overall Cognitive Status: Difficult to assess Area of Impairment: Problem solving, Awareness, Following commands, Attention                   Current Attention Level: Sustained   Following Commands: Follows one step commands with increased time   Awareness: Intellectual Problem Solving: Slow processing, Decreased initiation, Difficulty sequencing, Requires verbal cues, Requires tactile cues General Comments: pt with improved R sided attn however overall continues to have inattention grossly to R side        Exercises Other Exercises Other Exercises: AA pushing recliner backwards with R LE to strengthen R quad Other Exercises: AA pulling self forward in recliner to strengthen hamstrings    General Comments General comments (skin integrity, edema, etc.): VSS on RA      Pertinent Vitals/Pain Pain Assessment Pain Assessment: No/denies pain    Home Living                          Prior Function            PT Goals (current goals can now be found in the care plan section) Acute Rehab PT Goals Patient Stated Goal: did not state; sister wants pt to improve PT Goal Formulation: With patient/family Time For Goal Achievement: 07/05/22 Potential to Achieve Goals: Good Progress towards PT goals: Progressing toward goals    Frequency    Min 4X/week      PT Plan Current plan remains appropriate    Co-evaluation              AM-PAC PT "6 Clicks" Mobility    Outcome Measure  Help needed turning from your back to your side while in a flat bed without using bedrails?: A Little Help needed moving from lying on your back to sitting on the side of a flat bed without using bedrails?: A Lot Help needed moving to and from a bed to a chair (including a wheelchair)?: A Lot Help needed standing up from a chair using your arms (e.g., wheelchair or bedside chair)?: A Lot Help needed to walk in hospital room?: A Lot Help needed climbing 3-5 steps with a railing? : Total 6 Click Score: 12    End of Session Equipment Utilized During Treatment: Gait belt Activity Tolerance: Patient tolerated treatment well Patient left: in chair;with call bell/phone within reach;with chair alarm set Nurse Communication: Mobility status PT Visit Diagnosis: Unsteadiness on feet (R26.81);Other abnormalities of gait and mobility (R26.89);Muscle weakness (generalized) (M62.81);Difficulty in walking, not elsewhere classified (R26.2);Other symptoms and signs involving the nervous system (R29.898);Apraxia (R48.2)     Time: 1135-1200 PT Time Calculation (min) (ACUTE ONLY): 25 min  Charges:  $Gait Training: 8-22 mins $Neuromuscular Re-education: 8-22 mins                     Kittie Plater, PT, DPT Acute Rehabilitation Services Secure chat preferred Office #: [phone]  [name] [place] 06/24/2022, 1:06 PM

## 2022-06-24 NOTE — Progress Notes (Signed)
Occupational Therapy Treatment Patient Details Name: [name] MRN: [id number] DOB: 03/13/1956 Today's Date: 06/24/2022   History of present illness Pt is a 66 y.o. female who presented 06/19/22 with difficulty speaking. Pt found to have acute intraparenchymal hemorrhage centered at the left thalamus with trace localized left-to-right shift. PMH: HTN, numerous strokes in the past both embolic and hemorrhagic currently on ASA and Plavix, migraines, MI, CAD, CKD, HLD, and cardiomyopathy   OT comments  Pt progressing towards goals able to complete step pivot transfer x3 during session with mod A using RW with R hand RW splint. Pt needing cues to keep RUE on RW and step sequencing. Pt able to complete oral care and standing grooming task at sink with min A, pt with emergent awareness, gesturing/quickly pointing to self indicating need to use bathroom. Pt presenting with impairments listed below, will follow acutely. Continue to recommend AIR at d/c.   Recommendations for follow up therapy are one component of a multi-disciplinary discharge planning process, led by the attending physician.  Recommendations may be updated based on patient status, additional functional criteria and insurance authorization.    Follow Up Recommendations  Acute inpatient rehab (3hours/day)    Assistance Recommended at Discharge Frequent or constant Supervision/Assistance  Patient can return home with the following  A lot of help with walking and/or transfers;A lot of help with bathing/dressing/bathroom;Direct supervision/assist for medications management;Direct supervision/assist for financial management;Assist for transportation;Help with stairs or ramp for entrance   Equipment Recommendations  None recommended by OT;Other (comment) (defer)    Recommendations for Other Services PT consult;Rehab consult    Precautions / Restrictions Precautions Precautions: Fall Precaution Comments: SBP  130-150 Restrictions Weight Bearing Restrictions: No       Mobility Bed Mobility Overal bed mobility: Needs Assistance Bed Mobility: Supine to Sit       Sit to supine: Min guard        Transfers Overall transfer level: Needs assistance Equipment used: Rolling walker (2 wheels) (RW R hand splint) Transfers: Sit to/from Stand, Bed to chair/wheelchair/BSC Sit to Stand: Min assist     Step pivot transfers: Min assist           Balance Overall balance assessment: Needs assistance Sitting-balance support: Single extremity supported, Bilateral upper extremity supported, Feet supported Sitting balance-Leahy Scale: Good Sitting balance - Comments: pt with posterior lean today Postural control: Posterior lean, Right lateral lean Standing balance support: Bilateral upper extremity supported, During functional activity Standing balance-Leahy Scale: Poor Standing balance comment: reliant on external support                           ADL either performed or assessed with clinical judgement   ADL Overall ADL's : Needs assistance/impaired     Grooming: Standing;Oral care;Minimal assistance           Upper Body Dressing : Moderate assistance;Sitting;Standing Upper Body Dressing Details (indicate cue type and reason): to Estate agent Transfer: Moderate assistance;Rolling walker (2 wheels);Stand-pivot;BSC/3in1   Toileting- Water quality scientist and Hygiene: Total assistance;Sit to/from stand Toileting - Clothing Manipulation Details (indicate cue type and reason): pericare     Functional mobility during ADLs: Moderate assistance      Extremity/Trunk Assessment Upper Extremity Assessment Upper Extremity Assessment: LUE deficits/detail RUE Deficits / Details: mild weakness compared to L, able to functionall use for ADL task, shoulder AROM and grip strength WFL RUE Coordination: decreased fine motor LUE Deficits /  Details: able to use functionally for  ADL, shoulder AROM and grip strength WFL LUE Coordination: decreased fine motor   Lower Extremity Assessment Lower Extremity Assessment: Defer to PT evaluation        Vision   Vision Assessment?: No apparent visual deficits Additional Comments: will further assess   Perception Perception Perception: Not tested   Praxis Praxis Praxis: Not tested    Cognition Arousal/Alertness: Awake/alert Behavior During Therapy: Flat affect Overall Cognitive Status: Difficult to assess Area of Impairment: Problem solving, Awareness, Following commands, Attention                   Current Attention Level: Sustained   Following Commands: Follows one step commands with increased time   Awareness: Intellectual (gestures the need) Problem Solving: Slow processing, Decreased initiation, Difficulty sequencing, Requires verbal cues, Requires tactile cues          Exercises      Shoulder Instructions       General Comments VSS on RA    Pertinent Vitals/ Pain       Pain Assessment Pain Assessment: No/denies pain  Home Living                                          Prior Functioning/Environment              Frequency  Min 2X/week        Progress Toward Goals  OT Goals(current goals can now be found in the care plan section)  Progress towards OT goals: Progressing toward goals  Acute Rehab OT Goals Patient Stated Goal: to return to bed OT Goal Formulation: With patient Time For Goal Achievement: 07/06/22 Potential to Achieve Goals: Good ADL Goals Pt Will Perform Upper Body Dressing: with min guard assist;sitting Pt Will Perform Lower Body Dressing: with min assist;sitting/lateral leans;sit to/from stand Pt Will Transfer to Toilet: with min assist;squat pivot transfer;stand pivot transfer;bedside commode Additional ADL Goal #1: pt will complete bed mobility ind in prep for ADLs  Plan Discharge plan remains appropriate;Frequency remains  appropriate    Co-evaluation                 AM-PAC OT "6 Clicks" Daily Activity     Outcome Measure   Help from another person eating meals?: A Little Help from another person taking care of personal grooming?: A Little Help from another person toileting, which includes using toliet, bedpan, or urinal?: Total Help from another person bathing (including washing, rinsing, drying)?: A Lot Help from another person to put on and taking off regular upper body clothing?: A Lot Help from another person to put on and taking off regular lower body clothing?: A Lot 6 Click Score: 13    End of Session Equipment Utilized During Treatment: Gait belt;Rolling walker (2 wheels)  OT Visit Diagnosis: Unsteadiness on feet (R26.81);Other abnormalities of gait and mobility (R26.89);Muscle weakness (generalized) (M62.81);Other symptoms and signs involving cognitive function;Cognitive communication deficit (R41.841)   Activity Tolerance Patient tolerated treatment well   Patient Left in bed;with call bell/phone within reach;with bed alarm set   Nurse Communication Mobility status        Time: 1332-1400 OT Time Calculation (min): 28 min  Charges: OT General Charges $OT Visit: 1 Visit OT Treatments $Self Care/Home Management : 23-37 mins  Eloise Mula, OTD, OTR/L Acute Rehab [phone]   Lanelle Bal  Philbert Riser 06/24/2022, 2:46 PM

## 2022-06-24 NOTE — Progress Notes (Signed)
Inpatient Rehab Admissions Coordinator:    Per TOC, Pt. Going to [facility] when Josem Kaufmann is obtained. CIR will withdraw authorization and sign off.   Clemens Catholic, [place], [place] Admissions Coordinator  [phone] ([place]) [phone] (office)

## 2022-06-24 NOTE — Progress Notes (Signed)
Inpatient Rehab Admissions Coordinator:    I have auth for CIR; however, no family is able to provide 24/7 support, which is what ultimately what Pt. Will need following CIR. TOC working on SNF. Sister asks that I wait to withdraw auth for CIR until SNF is identified.   Clemens Catholic, [place], [place] Admissions Coordinator  [phone] ([place]) [phone] (office)

## 2022-06-25 DIAGNOSIS — I610 Nontraumatic intracerebral hemorrhage in hemisphere, subcortical: Secondary | ICD-10-CM | POA: Diagnosis not present

## 2022-06-25 MED ORDER — ORAL CARE MOUTH RINSE
15.0000 mL | OROMUCOSAL | Status: DC
  Administered 2022-06-25 – 2022-06-26 (×4): 15 mL via OROMUCOSAL

## 2022-06-25 MED ORDER — ORAL CARE MOUTH RINSE: 15.0000 mL | OROMUCOSAL | Status: DC | PRN

## 2022-06-25 NOTE — Progress Notes (Signed)
Physical Therapy Treatment Patient Details Name: [name] MRN: [id number] DOB: 30-Mar-1956 Today's Date: 06/25/2022   History of Present Illness Pt is a 66 y.o. female who presented 06/19/22 with difficulty speaking. Pt found to have acute intraparenchymal hemorrhage centered at the left thalamus with trace localized left-to-right shift. PMH: HTN, numerous strokes in the past both embolic and hemorrhagic currently on ASA and Plavix, migraines, MI, CAD, CKD, HLD, and cardiomyopathy    PT Comments    Progressing with acute rehab goals. Tolerated greater distance gait training today, requires up to mod assist for RW control, balance, and RLE advancement with frequent verbal and tactile cues. Min assist for bed mobility and transfer training. Patient will continue to benefit from skilled physical therapy services to further improve independence with functional mobility.    Recommendations for follow up therapy are one component of a multi-disciplinary discharge planning process, led by the attending physician.  Recommendations may be updated based on patient status, additional functional criteria and insurance authorization.  Follow Up Recommendations  Skilled nursing-short term rehab (<3 hours/day) (Pt declined by AIR, will need SNF level of care at d/c.) Can patient physically be transported by private vehicle: Yes   Assistance Recommended at Discharge Frequent or constant Supervision/Assistance  Patient can return home with the following A lot of help with walking and/or transfers;A lot of help with bathing/dressing/bathroom;Assistance with cooking/housework;Direct supervision/assist for medications management;Direct supervision/assist for financial management;Assist for transportation   Equipment Recommendations   (TBD next venue of care.)    Recommendations for Other Services       Precautions / Restrictions Precautions Precautions: Fall Restrictions Weight Bearing Restrictions: No      Mobility  Bed Mobility Overal bed mobility: Needs Assistance Bed Mobility: Supine to Sit     Supine to sit: Min assist     General bed mobility comments: pt with good initiation however required minA to manage R UE, cues for sequencing, able to scoot to EOB without assist.    Transfers Overall transfer level: Needs assistance Equipment used: Rolling walker (2 wheels) (with R hand walker splint) Transfers: Sit to/from Stand Sit to Stand: Min assist           General transfer comment: Min assist for RUE management and balance upon rising with slight posterior lean noted. Pt with heavy lean towards Rt at end of ambulatory distance needing max VC for sequencing and alignment prior to safely sitting.    Ambulation/Gait Ambulation/Gait assistance: Mod assist Gait Distance (Feet): 45 Feet Assistive device: Rolling walker (2 wheels) Gait Pattern/deviations: Step-to pattern, Step-through pattern, Decreased weight shift to right, Decreased stance time - right, Decreased step length - right, Narrow base of support, Decreased dorsiflexion - right, Ataxic Gait velocity: slow Gait velocity interpretation: <1.31 ft/sec, indicative of household ambulator   General Gait Details: Frequent verbal and tactile cues to improve RLE sequencing, step length, and accuracy. Some carry over with cues to reach forward with Rt heel and take larger step in RW. Mod assist required for balance, RW control and RLE advancement at times. More Rtward lean noted as she fatigued, cued to widen BOS was helpful. Cues to watch for objects in path of RW.   Stairs             Wheelchair Mobility    Modified Rankin (Stroke Patients Only) Modified Rankin (Stroke Patients Only) Pre-Morbid Rankin Score: Slight disability Modified Rankin: Moderately severe disability     Balance Overall balance assessment: Needs assistance Sitting-balance support:  Feet supported, Single extremity supported Sitting  balance-Leahy Scale: Poor Sitting balance - Comments: Falling towards Rt side on a couple of occasions requiring assistance to return to midline Postural control: Right lateral lean Standing balance support: Bilateral upper extremity supported, During functional activity, Reliant on assistive device for balance Standing balance-Leahy Scale: Poor Standing balance comment: reliant on external support                            Cognition Arousal/Alertness: Awake/alert Behavior During Therapy: Flat affect Overall Cognitive Status: Difficult to assess Area of Impairment: Problem solving, Awareness, Following commands, Attention                   Current Attention Level: Sustained   Following Commands: Follows one step commands with increased time   Awareness: Intellectual Problem Solving: Slow processing, Decreased initiation, Difficulty sequencing, Requires verbal cues, Requires tactile cues General Comments: pt with improved R sided attn however overall continues to have inattention grossly to R side        Exercises      General Comments        Pertinent Vitals/Pain Pain Assessment Pain Assessment: No/denies pain Pain Intervention(s): Monitored during session    Home Living                          Prior Function            PT Goals (current goals can now be found in the care plan section) Acute Rehab PT Goals Patient Stated Goal: did not state PT Goal Formulation: With patient/family Time For Goal Achievement: 07/05/22 Potential to Achieve Goals: Good Progress towards PT goals: Progressing toward goals    Frequency    Min 4X/week      PT Plan Discharge plan needs to be updated    Co-evaluation              AM-PAC PT "6 Clicks" Mobility   Outcome Measure  Help needed turning from your back to your side while in a flat bed without using bedrails?: A Little Help needed moving from lying on your back to sitting on the  side of a flat bed without using bedrails?: A Little Help needed moving to and from a bed to a chair (including a wheelchair)?: A Lot Help needed standing up from a chair using your arms (e.g., wheelchair or bedside chair)?: A Lot Help needed to walk in hospital room?: A Lot Help needed climbing 3-5 steps with a railing? : Total 6 Click Score: 13    End of Session Equipment Utilized During Treatment: Gait belt Activity Tolerance: Patient tolerated treatment well Patient left: in chair;with call bell/phone within reach;with chair alarm set Nurse Communication:  (NT - purewick out) PT Visit Diagnosis: Unsteadiness on feet (R26.81);Other abnormalities of gait and mobility (R26.89);Muscle weakness (generalized) (M62.81);Difficulty in walking, not elsewhere classified (R26.2);Other symptoms and signs involving the nervous system (R29.898);Apraxia (R48.2)     Time: 4967-5916 PT Time Calculation (min) (ACUTE ONLY): 21 min  Charges:  $Gait Training: 8-22 mins                     Candie Mile, PT    Ellouise Newer 06/25/2022, 11:13 AM

## 2022-06-25 NOTE — Progress Notes (Signed)
PROGRESS NOTE    ZIYANNA [name]  FKC:[id number] DOB: 22-Feb-1956 DOA: 06/19/2022 PCP: Baxter Hire, MD    Brief Narrative:   [name] is a 66 y.o. female with a past medical history of hypertension, multiple previous strokes currently on ASA and Plavix, migraines, myocardial infarction, CAD, CKD, HLD, and cardiomyopathy presented to the hospital with dysarthria.  Of note, and had  numerous strokes in the past both embolic and hemorrhagic. She did have a Zio patch after her last stroke which was negative for atrial fibrillation and she does follow with Dr. Quentin Ore and Christell Faith with [facility] (most recently seen 03/11/2022). She woke up on the morning of 06/19/2022 unable to speak and EMS was called. She went to bed in her usual state of health.  No mention of missing any medication.  Patient was initially admitted to hospital in the intensive care unit by neurology service.  Subsequently, patient has been considered stable and transferred out of the ICU.  At this time, patient is awaiting for inpatient rehabilitation/skilled nursing facility.   Assessment and Plan:  Principal Problem:   ICH (intracerebral hemorrhage) (HCC) Active Problems:   Essential hypertension   History of stroke   Hypokalemia   History of coronary artery stent placement   Chronic systolic CHF (congestive heart failure) (HCC)   Thalamic hemorrhage (HCC)  Acute left thalamic hemorrhage with dysarthria while on DAPT with plavix and ASA.  Thought to be secondary to hypertensive in etiology. Code Stroke was called in and CT scan of the head showed acute left thalamic hemorrhage with surrounding edema and minor mass effect.  Follow-up CT scan and MRIs were done during hospitalization.  2D Echo reviewed with LV ejection fraction of 65 to 70% with grade 1 diastolic dysfunction.  Hemoglobin A1c of 6.2, LDL 90.  Continue to hold aspirin and Plavix.  Patient has been started on heparin for DVT prophylaxis. Neurologic status  has been stable.  At this time awaiting for inpatient rehabilitation/skilled nursing facility.   Mild acute metabolic encephalopathy.  Resolved.   Essential hypertension Patient is on metoprolol, Amlodipine at home.  Required Cleviprex drip while in the ICU.  Has been transitioned to oral medication and lisinopril has been added for better blood pressure control  Hyperlipidemia LDL 90, goal < 70.  Continue statins on discharge.   Diabetes type II Uncontrolled Hemoglobin A1c of 6.2.  Not on medications at home.  Currently on sliding scale insulin.  We will continue to monitor.  Latest glucose level of 90  Dysphagia Seen by speech therapy and on dysphagia 1 diet.   Hypokalemia  Replenished.  Latest potassium of 3.7.  History of chronic systolic congestive heart failure.   Currently compensated. On aspirin Plavix Lasix, metoprolol and Crestor as outpatient.  Patient is positive balance for 1058 mL.  Still holding on Lasix since patient is only on pured.  CKD stage II.  Continue to monitor BMP.  Stable at this time.   DVT prophylaxis: heparin injection 5,000 Units Start: 06/21/22 1400 SCD's Start: 06/19/22 1523   Code Status:     Code Status: Full Code  Disposition: CIR as per physical therapy recommendation but also working on skilled nursing facility option.  Status is: Inpatient  Remains inpatient appropriate because: Intracranial hemorrhage, need for rehabilitation.  Patient appears to be medically stable for disposition.   Family Communication:  Spoke with the patient's sister on the phone on 06/23/2022.  Consultants:  Neurology PCCM  Procedures:  None  Antimicrobials:  None  Anti-infectives (From admission, onward)    None      Subjective: Patient was seen and examined at bedside today.  Denies any pain, nausea, vomiting, headache, fever or chills.  No events reported overnight, patient was able to answer my questions but at [place].  Objective: Vitals:   06/24/22 2335 06/25/22 0352 06/25/22 0725 06/25/22 1112  BP: (!) 152/78 (!) 141/73 (!) 150/81 (!) 144/74  Pulse: 62 66 60 (!) 51  Resp: '17 17  19  '$ Temp: 99 F (37.2 C) 99.3 F (37.4 C) 98.3 F (36.8 C) 98.4 F (36.9 C)  TempSrc: Oral Oral Oral Oral  SpO2: 90% 94% 93% 92%  Weight:      Height:        Intake/Output Summary (Last 24 hours) at 06/25/2022 1231 Last data filed at 06/24/2022 1700 Gross per 24 hour  Intake 360 ml  Output --  Net 360 ml    Filed Weights   06/19/22 1500 06/21/22 1837  Weight: 66.2 kg 66.2 kg    Physical Examination: Body mass index is 27.58 kg/m.   General:  Average built, not in obvious distress, dysarthric but improved speech HENT:   No scleral pallor or icterus noted. Oral mucosa is moist.  Chest:  Clear breath sounds.  Diminished breath sounds bilaterally. No crackles or wheezes.  CVS: S1 &S2 heard. No murmur.  Regular rate and rhythm. Abdomen: Soft, nontender, nondistended.  Bowel sounds are heard.   Extremities: No cyanosis, clubbing or edema.  Peripheral pulses are palpable. Psych: Alert, awake and oriented,  CNS: Improved speech, dysarthria.  Moves all extremities. Skin: Warm and dry.  No rashes noted.   Data Reviewed:   CBC: Recent Labs  Lab 06/19/22 0902 06/21/22 0124  WBC 10.2 12.8*  NEUTROABS 6.7  --   HGB 12.8 13.3  HCT 38.8 39.0  MCV 92.4 91.8  PLT 276 270     Basic Metabolic Panel: Recent Labs  Lab 06/19/22 0902 06/21/22 0124 06/22/22 0647 06/23/22 0418  NA 143 143 143 140  K 3.5 3.3* 3.6 3.7  CL 110 108 113* 112*  CO2 23 24 20* 19*  GLUCOSE 100* 104* 87 90  BUN '15 18 18 18  '$ CREATININE 0.86 0.87 0.86 0.78  CALCIUM 9.3 9.4 9.0 9.3  MG  --   --  1.9  --      Liver Function Tests: Recent Labs  Lab 06/19/22 0902  AST 25  ALT 24  ALKPHOS 79  BILITOT 0.7  PROT 7.0  ALBUMIN 3.8    Radiology Studies: No results found.    LOS: 6 days    Carlyle Lipa,  MD [facility] Available via Epic secure chat 7am-7pm After these hours, please refer to coverage provider listed on [email or web address] 06/25/2022, 12:31 PM

## 2022-06-26 DIAGNOSIS — I69391 Dysphagia following cerebral infarction: Secondary | ICD-10-CM | POA: Diagnosis not present

## 2022-06-26 DIAGNOSIS — I610 Nontraumatic intracerebral hemorrhage in hemisphere, subcortical: Secondary | ICD-10-CM | POA: Diagnosis not present

## 2022-06-26 DIAGNOSIS — Z7401 Bed confinement status: Secondary | ICD-10-CM | POA: Diagnosis not present

## 2022-06-26 DIAGNOSIS — R278 Other lack of coordination: Secondary | ICD-10-CM | POA: Diagnosis not present

## 2022-06-26 DIAGNOSIS — R531 Weakness: Secondary | ICD-10-CM | POA: Diagnosis not present

## 2022-06-26 DIAGNOSIS — M6281 Muscle weakness (generalized): Secondary | ICD-10-CM | POA: Diagnosis not present

## 2022-06-26 DIAGNOSIS — I5022 Chronic systolic (congestive) heart failure: Secondary | ICD-10-CM | POA: Diagnosis not present

## 2022-06-26 DIAGNOSIS — I69320 Aphasia following cerebral infarction: Secondary | ICD-10-CM | POA: Diagnosis not present

## 2022-06-26 DIAGNOSIS — R2689 Other abnormalities of gait and mobility: Secondary | ICD-10-CM | POA: Diagnosis not present

## 2022-06-26 DIAGNOSIS — I10 Essential (primary) hypertension: Secondary | ICD-10-CM | POA: Diagnosis not present

## 2022-06-26 DIAGNOSIS — I25118 Atherosclerotic heart disease of native coronary artery with other forms of angina pectoris: Secondary | ICD-10-CM | POA: Diagnosis not present

## 2022-06-26 MED ORDER — LISINOPRIL 5 MG PO TABS
5.0000 mg | ORAL_TABLET | Freq: Every day | ORAL | Status: AC
Start: 2022-06-27 — End: ?

## 2022-06-26 NOTE — Discharge Summary (Signed)
Physician Discharge Summary  [name] [name] BTD:[id number] DOB: 1956/06/13 DOA: 06/19/2022  PCP: Baxter Hire, MD  Admit date: 06/19/2022 Discharge date: 06/26/2022  Admitted From: Home Disposition: Skilled nursing facility  Recommendations for Outpatient Follow-up:  Follow up with PCP in 1-2 weeks Neurology follow-up, send referral   Discharge Condition: Fair CODE STATUS: Full code Diet recommendation: Low-salt diet.  Pure with nectar thick liquids until cleared by speech therapy.  Discharge summary: [name] [name] is a 66 y.o. female with a past medical history of hypertension, multiple previous strokes who was on ASA and Plavix, migraines, myocardial infarction, CAD, CKD, HLD, and cardiomyopathy presented to the hospital with dysarthria.  Of note, she had  numerous strokes in the past both embolic and hemorrhagic. She did have a Zio patch after her last stroke which was negative for atrial fibrillation and she does follow with Dr. Quentin Ore and Christell Faith with [facility] (most recently seen 03/11/2022). She woke up on the morning of 06/19/2022 unable to speak and EMS was called. She went to bed in her usual state of health.    Patient was initially admitted to hospital in the intensive care unit by neurology service.  Subsequently, patient has been considered stable and transferred out of the ICU.  Patient was treated for following conditions.   Acute left thalamic hemorrhage with dysarthria while on DAPT with plavix and ASA.  Thought to be secondary to hypertensive in etiology. Code Stroke was called in and CT scan of the head showed acute left thalamic hemorrhage with surrounding edema and minor mass effect.  Follow-up CT scan and MRIs were done during hospitalization.  2D Echo reviewed with LV ejection fraction of 65 to 70% with grade 1 diastolic dysfunction.  Hemoglobin A1c of 6.2, LDL 90.   -Patient on aspirin and Plavix before coming to the hospital.  Neurology recommended to resume aspirin  on discharge.  Continue to hold Plavix. -Patient is already on statin that she will continue. -Outpatient neurology follow-up. -Tight blood pressure control.    Mild acute metabolic encephalopathy due to acute stroke: Resolved.   Essential hypertension Patient is on metoprolol, Amlodipine at home.  Required Cleviprex drip while in the ICU.  Has been transitioned to oral medication and lisinopril has been added for better blood pressure control.  Discharged on amlodipine, metoprolol and lisinopril.   Hyperlipidemia LDL 90, goal < 70.  Continue statins on discharge.    Diabetes type II controlled. Hemoglobin A1c of 6.2.  No need for treatment.  Dysphagia and dysarthria. Seen by speech therapy and on dysphagia 1 diet.  Will need ongoing therapies with PT/OT/speech.   History of chronic systolic congestive heart failure.   Currently compensated.  Resume Lasix.   CKD stage II.  Stable at around baseline.  Stable to transfer to skilled level of care to continue aggressive therapies.   Discharge Diagnoses:  Principal Problem:   ICH (intracerebral hemorrhage) (HCC) Active Problems:   Essential hypertension   History of stroke   Hypokalemia   History of coronary artery stent placement   Chronic systolic CHF (congestive heart failure) ([place])   Thalamic hemorrhage [facility]    Discharge Instructions  Discharge Instructions     Ambulatory referral to Neurology   Complete by: As directed    An appointment is requested in approximately: 4 weeks   Diet - low sodium heart healthy   Complete by: As directed    Dysphagia 1 diet. Puree diet with nectar thick liquids, aspiration precautions  Increase activity slowly   Complete by: As directed       Allergies as of 06/26/2022       Reactions   Codeine Sulfate Other (See Comments)   sick   Penicillins Nausea And Vomiting   Has patient had a PCN reaction causing immediate rash, facial/tongue/throat swelling, SOB or lightheadedness  with hypotension: No Has patient had a PCN reaction causing severe rash involving mucus membranes or skin necrosis: No Has patient had a PCN reaction that required hospitalization No Has patient had a PCN reaction occurring within the last 10 years: No If all of the above answers are "NO", then may proceed with Cephalosporin use.        Medication List     STOP taking these medications    clopidogrel 75 MG tablet Commonly known as: PLAVIX       TAKE these medications    amLODipine 10 MG tablet Commonly known as: NORVASC Take 1 tablet (10 mg total) by mouth daily.   aspirin EC 81 MG tablet Take 1 tablet (81 mg total) by mouth daily. Swallow whole. What changed: additional instructions   FLUoxetine 20 MG capsule Commonly known as: PROZAC Take 20 mg by mouth daily.   furosemide 20 MG tablet Commonly known as: LASIX TAKE 1 TABLET(20 MG) BY MOUTH DAILY What changed: See the new instructions.   lisinopril 5 MG tablet Commonly known as: ZESTRIL Take 1 tablet (5 mg total) by mouth daily. Start taking on: June 27, 2022   metoprolol succinate 50 MG 24 hr tablet Commonly known as: TOPROL-XL TAKE 1 TABLET(50 MG) BY MOUTH DAILY WITH OR IMMEDIATELY FOLLOWING A MEAL What changed: See the new instructions.   nitroGLYCERIN 0.4 MG SL tablet Commonly known as: NITROSTAT Place 1 tablet (0.4 mg total) under the tongue every 5 (five) minutes as needed for chest pain (If you experience chest pain: Place 1 nitroglycerin under your tongue, while sitting. If no relief of pain, may repeat 1 tablet every 5 minutes up to 3 tablets over 15 minutes. If no relief call 911. If you have dizziness/lightheadedness while taking nitroglycerin, stop taking and call 911.). What changed: reasons to take this   pantoprazole 40 MG tablet Commonly known as: PROTONIX Take 40 mg by mouth 2 (two) times daily.   rosuvastatin 40 MG tablet Commonly known as: CRESTOR Take 1 tablet (40 mg total) by mouth  daily.        Allergies  Allergen Reactions   Codeine Sulfate Other (See Comments)    sick   Penicillins Nausea And Vomiting    Has patient had a PCN reaction causing immediate rash, facial/tongue/throat swelling, SOB or lightheadedness with hypotension: No Has patient had a PCN reaction causing severe rash involving mucus membranes or skin necrosis: No Has patient had a PCN reaction that required hospitalization No Has patient had a PCN reaction occurring within the last 10 years: No If all of the above answers are "NO", then may proceed with Cephalosporin use.     Consultations: Neurology   Procedures/Studies: CT ANGIO HEAD NECK W WO CM  Result Date: 06/21/2022 CLINICAL DATA:  Follow-up exam for hemorrhagic stroke. EXAM: CT ANGIOGRAPHY HEAD AND NECK TECHNIQUE: Multidetector CT imaging of the head and neck was performed using the standard protocol during bolus administration of intravenous contrast. Multiplanar CT image reconstructions and MIPs were obtained to evaluate the vascular anatomy. Carotid stenosis measurements (when applicable) are obtained utilizing NASCET criteria, using the distal internal carotid diameter as the  denominator. RADIATION DOSE REDUCTION: This exam was performed according to the departmental dose-optimization program which includes automated exposure control, adjustment of the mA and/or kV according to patient size and/or use of iterative reconstruction technique. CONTRAST:  17m OMNIPAQUE IOHEXOL 350 MG/ML SOLN COMPARISON:  Prior studies from 06/20/2022 and earlier FINDINGS: CT HEAD FINDINGS Brain: Previously identified left thalamic hemorrhage is not significantly changed in size and morphology as compared to previous. Mild localized edema without significant regional mass effect. No intraventricular extension. No new intracranial hemorrhage or large vessel territory infarct. Underlying atrophy with chronic microvascular ischemic disease again noted. Few  additional scattered remote lacunar infarcts noted, better characterized prior brain MRI. No mass lesion or significant midline shift. No hydrocephalus or extra-axial fluid collection. Vascular: No hyperdense vessel. Skull: Scalp soft tissues and calvarium demonstrate no new finding. Sinuses/Orbits: Left gaze noted. Globes orbital soft tissues demonstrate no other acute finding. Mild scattered mucosal thickening about the sphenoethmoidal sinuses. Mastoid air cells remain largely clear. Other: None. Review of the MIP images confirms the above findings CTA NECK FINDINGS Aortic arch: Visualized aortic arch normal caliber with standard 3 vessel morphology. No stenosis or other abnormality about the orbital great vessels. Right carotid system: Right common and internal carotid arteries patent without dissection or other acute finding. Mild mixed plaque about the right carotid bulb without hemodynamically significant stenosis. Left carotid system: Left common and internal carotid arteries patent without dissection or other acute finding. Calcified plaque about the left carotid bulb without hemodynamically significant greater than 50% stenosis. Vertebral arteries: Both vertebral arteries arise from the subclavian arteries. No proximal subclavian artery stenosis. Left vertebral artery dominant. Vertebral arteries patent without stenosis or dissection. Skeleton: No discrete or worrisome osseous lesions. Mild spondylosis for age. Other neck: No other acute soft tissue abnormality within the neck. Upper chest: Hazy subsegmental atelectatic changes noted dependently within the visualized lungs. Visualized upper chest demonstrates no other acute finding. Review of the MIP images confirms the above findings CTA HEAD FINDINGS Anterior circulation: Petrous segments patent bilaterally. Mild for age atheromatous change within the carotid siphons without significant stenosis. A1 segments patent bilaterally. Normal anterior  communicating complex. Atheromatous irregularity within both ACAs without significant stenosis. No M1 stenosis or occlusion. No proximal MCA branch occlusion. Distal MCA branches perfused and symmetric. Distal small vessel atheromatous irregularity noted. Posterior circulation: Atheromatous irregularity within the V4 segments bilaterally without significant stenosis. Left PICA patent. Right PICA origin not well seen. Basilar patent to its distal aspect without stenosis superior cerebral arteries patent bilaterally. Both PCAs primarily supplied via the basilar. Atheromatous irregularity throughout the PCAs bilaterally. Associated moderate proximal left P2 stenosis (series 14, image 20). No other hemodynamically significant stenosis. Venous sinuses: Patent allowing for timing the contrast bolus. Anatomic variants: None significant. No intracranial aneurysm. No vascular malformation seen overlying the left thalamic hemorrhage. Review of the MIP images confirms the above findings IMPRESSION: CT HEAD IMPRESSION: 1. Relatively stable size and appearance of acute left thalamic hemorrhage. Mild localized edema without significant regional mass effect. No interval intraventricular extension or other complicating features. 2. No other new acute intracranial abnormality. CTA HEAD AND NECK IMPRESSION: 1. Negative CTA for large vessel occlusion. No vascular abnormality seen underlying the left thalamic hemorrhage. 2. Mild-to-moderate atheromatous change about the major arterial vasculature of the neck. Associated moderate proximal left P2 stenosis. No other hemodynamically significant or correctable stenosis. Electronically Signed   By: BJeannine BogaM.D.   On: 06/21/2022 04:52   CT HEAD WO CONTRAST (  5MM)  Result Date: 06/20/2022 CLINICAL DATA:  66 year old female with left thalamic hemorrhage. EXAM: CT HEAD WITHOUT CONTRAST TECHNIQUE: Contiguous axial images were obtained from the base of the skull through the vertex  without intravenous contrast. RADIATION DOSE REDUCTION: This exam was performed according to the departmental dose-optimization program which includes automated exposure control, adjustment of the mA and/or kV according to patient size and/or use of iterative reconstruction technique. COMPARISON:  Brain MRI 0228 hours today.  Head CTs 06/19/2022. FINDINGS: Brain: Oval hyperdense hemorrhage in the left thalamus with surrounding edema. Blood products 13 x 19 x 16 mm (AP by transverse by CC), 2 mL and minimally increased from the presentation CT 0853 hours yesterday. Regional edema but no significant mass effect. No intraventricular or extra-axial extension of blood. No ventriculomegaly. Stable gray-white matter differentiation elsewhere. Vascular: Mild Calcified atherosclerosis at the skull base. Skull: No acute osseous abnormality identified. Sinuses/Orbits: Visualized paranasal sinuses and mastoids are stable and well aerated. Other: No acute orbit or scalp soft tissue finding. IMPRESSION: 1. Small 2 mL left thalamic hemorrhage, blood products minimally increased since 0853 hours yesterday. Regional edema but no significant mass effect or other complicating features. 2. No new intracranial abnormality. Electronically Signed   By: Genevie Ann M.D.   On: 06/20/2022 08:06   MR BRAIN WO CONTRAST  Result Date: 06/20/2022 CLINICAL DATA:  Follow-up examination for stroke. EXAM: MRI HEAD WITHOUT CONTRAST TECHNIQUE: Multiplanar, multiecho pulse sequences of the brain and surrounding structures were obtained without intravenous contrast. COMPARISON:  Prior CT from 06/19/2022. FINDINGS: Brain: Examination mildly degraded by motion artifact. Generalized age-related cerebral atrophy. Patchy T2/FLAIR hyperintensity involving the periventricular deep white matter both cerebral hemispheres as well as the pons, consistent with chronic small vessel ischemic disease, moderately advanced in nature. Multiple remote lacunar infarcts  present about the bilateral basal ganglia and hemispheric cerebral white matter. Remote hemorrhagic lacunar infarct involving the right thalamus noted. Previously identified intraparenchymal hemorrhage centered at the left thalamus again seen, relatively similar in size and morphology measuring approximately 1.6 x 1.3 x 1.3 cm (estimated volume 1.4 mL). Surrounding vasogenic edema with trace localized left-to-right shift. No intraventricular extension. No visible underlying lesion seen at this location. No intraventricular extension. No other acute intracranial hemorrhage elsewhere within the brain. Few additional scattered chronic micro hemorrhages noted, likely related to chronic poorly controlled hypertension. No other evidence for acute or subacute ischemia. No areas of chronic cortical infarction. No mass lesion. Ventricles normal size without hydrocephalus. No extra-axial fluid collection. Pituitary gland and suprasellar region within normal limits. Vascular: Major intracranial vascular flow voids are maintained. Skull and upper cervical spine: Craniocervical junction within normal limits. Bone marrow signal intensity normal. No scalp soft tissue abnormality. Sinuses/Orbits: Globes orbital soft tissues demonstrate no acute finding. Mild scattered mucosal thickening about the ethmoidal air cells. Paranasal sinuses are otherwise largely clear. No significant mastoid effusion. Other: None. IMPRESSION: 1. No significant interval change in size and morphology of acute intraparenchymal hemorrhage centered at the left thalamus, estimated volume 1.4 mL. Surrounding vasogenic edema with trace localized left-to-right shift. No intraventricular extension. No visible underlying lesion at this location. 2. No other acute intracranial abnormality. 3. Age-related cerebral atrophy with moderate chronic small vessel ischemic disease, with multiple remote lacunar infarcts about the hemispheric cerebral white matter and deep  gray nuclei. Electronically Signed   By: Jeannine Boga M.D.   On: 06/20/2022 04:44   CT HEAD WO CONTRAST (5MM)  Result Date: 06/19/2022 CLINICAL DATA:  Stroke follow-up  EXAM: CT HEAD WITHOUT CONTRAST TECHNIQUE: Contiguous axial images were obtained from the base of the skull through the vertex without intravenous contrast. RADIATION DOSE REDUCTION: This exam was performed according to the departmental dose-optimization program which includes automated exposure control, adjustment of the mA and/or kV according to patient size and/or use of iterative reconstruction technique. COMPARISON:  CT head earlier today FINDINGS: Brain: Redemonstrated acute parenchymal hemorrhage involving the left thalamus. This is unchanged in size using similar measuring technique and again measures 1.2 x 1.7 x 1.3 cm. Similar surrounding edema with partial effacement of the third ventricle. Ventricular caliber is otherwise unchanged in size. No evidence of new hemorrhage or acute infarct. Vascular: No hyperdense vessel or unexpected calcification. Skull: Normal. Negative for fracture or focal lesion. Sinuses/Orbits: No acute finding. Other: None. IMPRESSION: Unchanged acute left thalamic hemorrhage with surrounding edema and minor mass effect. No intraventricular extension. Electronically Signed   By: Placido Sou M.D.   On: 06/19/2022 20:00   ECHOCARDIOGRAM COMPLETE  Result Date: 06/19/2022    ECHOCARDIOGRAM REPORT   Patient Name:   [name] [name] Date of Exam: 06/19/2022 Medical Rec #:  [id number]     Height:       62.0 in Accession #:    [id number]    Weight:       145.9 lb Date of Birth:  08/26/1956     BSA:          1.672 m Patient Age:    88 years      BP:           137/72 mmHg Patient Gender: F             HR:           99 bpm. Exam Location:  Inpatient Procedure: 2D Echo, Cardiac Doppler and Color Doppler Indications:    Stroke  History:        Patient has prior history of Echocardiogram examinations, most                  recent 03/12/2022. CHF, CAD, Stroke; Risk Factors:Former Smoker,                 Hypertension and Dyslipidemia.  Sonographer:    Merrie Roof RDCS Referring Phys: [id number] [place]  1. Left ventricular ejection fraction, by estimation, is 65 to 70%. The left ventricle has normal function. The left ventricle has no regional wall motion abnormalities. There is mild concentric left ventricular hypertrophy. Left ventricular diastolic parameters are consistent with Grade I diastolic dysfunction (impaired relaxation).  2. Right ventricular systolic function is normal. The right ventricular size is normal. Tricuspid regurgitation signal is inadequate for assessing PA pressure.  3. The mitral valve is normal in structure. No evidence of mitral valve regurgitation.  4. The aortic valve is tricuspid. There is mild calcification of the aortic valve. There is mild thickening of the aortic valve. Aortic valve regurgitation is mild. Aortic valve sclerosis/calcification is present, without any evidence of aortic stenosis.  5. The inferior vena cava is normal in size with greater than 50% respiratory variability, suggesting right atrial pressure of 3 mmHg. Comparison(s): No significant change from prior study. Conclusion(s)/Recommendation(s): No intracardiac source of embolism detected on this transthoracic study. Consider a transesophageal echocardiogram to exclude cardiac source of embolism if clinically indicated. FINDINGS  Left Ventricle: Left ventricular ejection fraction, by estimation, is 65 to 70%. The left ventricle has normal function. The left ventricle has no regional wall  motion abnormalities. The left ventricular internal cavity size was normal in size. There is  mild concentric left ventricular hypertrophy. Left ventricular diastolic parameters are consistent with Grade I diastolic dysfunction (impaired relaxation). Right Ventricle: The right ventricular size is normal. No increase in right  ventricular wall thickness. Right ventricular systolic function is normal. Tricuspid regurgitation signal is inadequate for assessing PA pressure. Left Atrium: Left atrial size was normal in size. Right Atrium: Right atrial size was normal in size. Pericardium: There is no evidence of pericardial effusion. Mitral Valve: The mitral valve is normal in structure. No evidence of mitral valve regurgitation. Tricuspid Valve: The tricuspid valve is normal in structure. Tricuspid valve regurgitation is trivial. Aortic Valve: The aortic valve is tricuspid. There is mild calcification of the aortic valve. There is mild thickening of the aortic valve. Aortic valve regurgitation is mild. Aortic valve sclerosis/calcification is present, without any evidence of aortic stenosis. Pulmonic Valve: The pulmonic valve was not well visualized. Pulmonic valve regurgitation is trivial. Aorta: The aortic root and ascending aorta are structurally normal, with no evidence of dilitation. Venous: The inferior vena cava is normal in size with greater than 50% respiratory variability, suggesting right atrial pressure of 3 mmHg. IAS/Shunts: The atrial septum is grossly normal.  LEFT VENTRICLE PLAX 2D LVIDd:         4.20 cm   Diastology LVIDs:         3.00 cm   LV e' medial:    7.18 cm/s LV PW:         1.30 cm   LV E/e' medial:  9.1 LV IVS:        1.40 cm   LV e' lateral:   6.74 cm/s LVOT diam:     2.10 cm   LV E/e' lateral: 9.7 LV SV:         64 LV SV Index:   38 LVOT Area:     3.46 cm  RIGHT VENTRICLE RV Basal diam:  2.60 cm LEFT ATRIUM             Index        RIGHT ATRIUM           Index LA diam:        3.50 cm 2.09 cm/m   RA Area:     13.30 cm LA Vol (A2C):   44.2 ml 26.43 ml/m  RA Volume:   29.60 ml  17.70 ml/m LA Vol (A4C):   41.1 ml 24.58 ml/m LA Biplane Vol: 46.2 ml 27.63 ml/m  AORTIC VALVE LVOT Vmax:   125.00 cm/s LVOT Vmean:  82.300 cm/s LVOT VTI:    0.184 m  AORTA Ao Root diam: 3.10 cm Ao Asc diam:  3.20 cm MITRAL VALVE MV Area  (PHT): 5.02 cm     SHUNTS MV Decel Time: 151 msec     Systemic VTI:  0.18 m MV E velocity: 65.20 cm/s   Systemic Diam: 2.10 cm MV A velocity: 108.00 cm/s MV E/A ratio:  0.60 Gwyndolyn Kaufman MD Electronically signed by Gwyndolyn Kaufman MD Signature Date/Time: 06/19/2022/6:14:05 PM    Final    CT HEAD CODE STROKE WO CONTRAST  Result Date: 06/19/2022 CLINICAL DATA:  Code stroke. EXAM: CT HEAD WITHOUT CONTRAST TECHNIQUE: Contiguous axial images were obtained from the base of the skull through the vertex without intravenous contrast. RADIATION DOSE REDUCTION: This exam was performed according to the departmental dose-optimization program which includes automated exposure control, adjustment of the mA and/or kV  according to patient size and/or use of iterative reconstruction technique. COMPARISON:  07/24/2021 FINDINGS: Brain: Acute parenchymal hemorrhage involving the left thalamus measuring approximately 1.1 x 1.7 x 1.2 cm with surrounding edema. There is partial effacement of the adjacent third ventricle. No evidence of intraventricular extension. No new loss of gray-white differentiation. Patchy and confluent areas of low-density in the supratentorial white matter nonspecific but probably reflects stable chronic microvascular ischemic changes. Superimposed scattered chronic small vessel infarcts as seen previously. Prominence of the ventricles and sulci reflects stable minor parenchymal volume loss. Vascular: No hyperdense vessel. Skull: Unremarkable. Sinuses/Orbits: No acute finding. Other: Mastoid air cells are clear. IMPRESSION: Acute left thalamic hemorrhage with surrounding edema and minor mass effect. No intraventricular extension. These results were communicated to Dr. Leonel Ramsay at 8:55 am on 06/19/2022 by text page via the [facility] messaging system. Electronically Signed   By: Macy Mis M.D.   On: 06/19/2022 08:58   (Echo, Carotid, EGD, Colonoscopy, ERCP)    Subjective: Patient seen in the morning  rounds.  Emotional due to trouble speaking.  No other events.   Discharge Exam: Vitals:   06/25/22 2358 06/26/22 0821  BP: 131/78 (!) 150/85  Pulse: 64 60  Resp: 18 19  Temp: 98.8 F (37.1 C) 98 F (36.7 C)  SpO2: 94% 91%   Vitals:   06/25/22 1601 06/25/22 2032 06/25/22 2358 06/26/22 0821  BP: (!) 131/55 (!) 153/72 131/78 (!) 150/85  Pulse: (!) 56 67 64 60  Resp:  '18 18 19  '$ Temp: 98.4 F (36.9 C) 98.3 F (36.8 C) 98.8 F (37.1 C) 98 F (36.7 C)  TempSrc: Oral Oral Oral Oral  SpO2: 96% 91% 94% 91%  Weight:      Height:        General: Pt is alert, awake, not in acute distress Anxious.  Dysarthric and difficult to to articulate expression.  No focal neurological deficits on the upper and lower extremities. Cardiovascular: RRR, S1/S2 +, no rubs, no gallops Respiratory: CTA bilaterally, no wheezing, no rhonchi Abdominal: Soft, NT, ND, bowel sounds + Extremities: no edema, no cyanosis    The results of significant diagnostics from this hospitalization (including imaging, microbiology, ancillary and laboratory) are listed below for reference.     Microbiology: Recent Results (from the past 240 hour(s))  Resp Panel by RT-PCR (Flu A&B, Covid) Anterior Nasal Swab     Status: None   Collection Time: 06/19/22  9:02 AM   Specimen: Anterior Nasal Swab  Result Value Ref Range Status   SARS Coronavirus 2 by RT PCR NEGATIVE NEGATIVE Final    Comment: (NOTE) SARS-CoV-2 target nucleic acids are NOT DETECTED.  The SARS-CoV-2 RNA is generally detectable in upper respiratory specimens during the acute phase of infection. The lowest concentration of SARS-CoV-2 viral copies this assay can detect is 138 copies/mL. A negative result does not preclude SARS-Cov-2 infection and should not be used as the sole basis for treatment or other patient management decisions. A negative result may occur with  improper specimen collection/handling, submission of specimen other than nasopharyngeal  swab, presence of viral mutation(s) within the areas targeted by this assay, and inadequate number of viral copies(<138 copies/mL). A negative result must be combined with clinical observations, patient history, and epidemiological information. The expected result is Negative.  Fact Sheet for Patients:  EntrepreneurPulse.com.au  Fact Sheet for Healthcare Providers:  IncredibleEmployment.be  This test is no t yet approved or cleared by the [place] and  has been authorized  for detection and/or diagnosis of SARS-CoV-2 by FDA under an Emergency Use Authorization (EUA). This EUA will remain  in effect (meaning this test can be used) for the duration of the COVID-19 declaration under Section 564(b)(1) of the Act, 21 U.S.C.section 360bbb-3(b)(1), unless the authorization is terminated  or revoked sooner.       Influenza A by PCR NEGATIVE NEGATIVE Final   Influenza B by PCR NEGATIVE NEGATIVE Final    Comment: (NOTE) The Xpert Xpress SARS-CoV-2/FLU/RSV plus assay is intended as an aid in the diagnosis of influenza from Nasopharyngeal swab specimens and should not be used as a sole basis for treatment. Nasal washings and aspirates are unacceptable for Xpert Xpress SARS-CoV-2/FLU/RSV testing.  Fact Sheet for Patients: EntrepreneurPulse.com.au  Fact Sheet for Healthcare Providers: IncredibleEmployment.be  This test is not yet approved or cleared by the [place] FDA and has been authorized for detection and/or diagnosis of SARS-CoV-2 by FDA under an Emergency Use Authorization (EUA). This EUA will remain in effect (meaning this test can be used) for the duration of the COVID-19 declaration under Section 564(b)(1) of the Act, 21 U.S.C. section 360bbb-3(b)(1), unless the authorization is terminated or revoked.  Performed at [facility], [address] [place], [place] [address]   MRSA  Next Gen by PCR, Nasal     Status: None   Collection Time: 06/19/22  8:26 PM   Specimen: Nasal Mucosa; Nasal Swab  Result Value Ref Range Status   MRSA by PCR Next Gen NOT DETECTED NOT DETECTED Final    Comment: (NOTE) The GeneXpert MRSA Assay (FDA approved for NASAL specimens only), is one component of a comprehensive MRSA colonization surveillance program. It is not intended to diagnose MRSA infection nor to guide or monitor treatment for MRSA infections. Test performance is not FDA approved in patients less than 5 years old. Performed at [facility], [place] [address] [place], [place] [address]      Labs: BNP (last 3 results) No results for input(s): "BNP" in the last 8760 hours. Basic Metabolic Panel: Recent Labs  Lab 06/21/22 0124 06/22/22 0647 06/23/22 0418  NA 143 143 140  K 3.3* 3.6 3.7  CL 108 113* 112*  CO2 24 20* 19*  GLUCOSE 104* 87 90  BUN '18 18 18  '$ CREATININE 0.87 0.86 0.78  CALCIUM 9.4 9.0 9.3  MG  --  1.9  --    Liver Function Tests: No results for input(s): "AST", "ALT", "ALKPHOS", "BILITOT", "PROT", "ALBUMIN" in the last 168 hours. No results for input(s): "LIPASE", "AMYLASE" in the last 168 hours. No results for input(s): "AMMONIA" in the last 168 hours. CBC: Recent Labs  Lab 06/21/22 0124  WBC 12.8*  HGB 13.3  HCT 39.0  MCV 91.8  PLT 270   Cardiac Enzymes: No results for input(s): "CKTOTAL", "CKMB", "CKMBINDEX", "TROPONINI" in the last 168 hours. BNP: Invalid input(s): "POCBNP" CBG: No results for input(s): "GLUCAP" in the last 168 hours. D-Dimer No results for input(s): "DDIMER" in the last 72 hours. Hgb A1c No results for input(s): "HGBA1C" in the last 72 hours. Lipid Profile No results for input(s): "CHOL", "HDL", "LDLCALC", "TRIG", "CHOLHDL", "LDLDIRECT" in the last 72 hours. Thyroid function studies No results for input(s): "TSH", "T4TOTAL", "T3FREE", "THYROIDAB" in the last 72 hours.  Invalid input(s): "FREET3" Anemia  work up No results for input(s): "VITAMINB12", "FOLATE", "FERRITIN", "TIBC", "IRON", "RETICCTPCT" in the last 72 hours. Urinalysis    Component Value Date/Time   COLORURINE STRAW (A) 06/19/2022  [place] (A) 06/19/2022 1030   LABSPEC 1.009 06/19/2022 1030   PHURINE 7.0 06/19/2022 1030   GLUCOSEU NEGATIVE 06/19/2022 1030   HGBUR NEGATIVE 06/19/2022 1030   BILIRUBINUR NEGATIVE 06/19/2022 1030   KETONESUR NEGATIVE 06/19/2022 1030   PROTEINUR NEGATIVE 06/19/2022 1030   UROBILINOGEN 0.2 02/18/2015 0410   NITRITE NEGATIVE 06/19/2022 1030   LEUKOCYTESUR NEGATIVE 06/19/2022 1030   Sepsis Labs Recent Labs  Lab 06/21/22 0124  WBC 12.8*   Microbiology Recent Results (from the past 240 hour(s))  Resp Panel by RT-PCR (Flu A&B, Covid) Anterior Nasal Swab     Status: None   Collection Time: 06/19/22  9:02 AM   Specimen: Anterior Nasal Swab  Result Value Ref Range Status   SARS Coronavirus 2 by RT PCR NEGATIVE NEGATIVE Final    Comment: (NOTE) SARS-CoV-2 target nucleic acids are NOT DETECTED.  The SARS-CoV-2 RNA is generally detectable in upper respiratory specimens during the acute phase of infection. The lowest concentration of SARS-CoV-2 viral copies this assay can detect is 138 copies/mL. A negative result does not preclude SARS-Cov-2 infection and should not be used as the sole basis for treatment or other patient management decisions. A negative result may occur with  improper specimen collection/handling, submission of specimen other than nasopharyngeal swab, presence of viral mutation(s) within the areas targeted by this assay, and inadequate number of viral copies(<138 copies/mL). A negative result must be combined with clinical observations, patient history, and epidemiological information. The expected result is Negative.  Fact Sheet for Patients:  EntrepreneurPulse.com.au  Fact Sheet for Healthcare Providers:   IncredibleEmployment.be  This test is no t yet approved or cleared by the [place] FDA and  has been authorized for detection and/or diagnosis of SARS-CoV-2 by FDA under an Emergency Use Authorization (EUA). This EUA will remain  in effect (meaning this test can be used) for the duration of the COVID-19 declaration under Section 564(b)(1) of the Act, 21 U.S.C.section 360bbb-3(b)(1), unless the authorization is terminated  or revoked sooner.       Influenza A by PCR NEGATIVE NEGATIVE Final   Influenza B by PCR NEGATIVE NEGATIVE Final    Comment: (NOTE) The Xpert Xpress SARS-CoV-2/FLU/RSV plus assay is intended as an aid in the diagnosis of influenza from Nasopharyngeal swab specimens and should not be used as a sole basis for treatment. Nasal washings and aspirates are unacceptable for Xpert Xpress SARS-CoV-2/FLU/RSV testing.  Fact Sheet for Patients: EntrepreneurPulse.com.au  Fact Sheet for Healthcare Providers: IncredibleEmployment.be  This test is not yet approved or cleared by the [place] FDA and has been authorized for detection and/or diagnosis of SARS-CoV-2 by FDA under an Emergency Use Authorization (EUA). This EUA will remain in effect (meaning this test can be used) for the duration of the COVID-19 declaration under Section 564(b)(1) of the Act, 21 U.S.C. section 360bbb-3(b)(1), unless the authorization is terminated or revoked.  Performed at [facility], [address] [place], [place] [address]   MRSA Next Gen by PCR, Nasal     Status: None   Collection Time: 06/19/22  8:26 PM   Specimen: Nasal Mucosa; Nasal Swab  Result Value Ref Range Status   MRSA by PCR Next Gen NOT DETECTED NOT DETECTED Final    Comment: (NOTE) The GeneXpert MRSA Assay (FDA approved for NASAL specimens only), is one component of a comprehensive MRSA colonization surveillance program. It is not intended to  diagnose MRSA infection nor to guide or monitor treatment for MRSA  infections. Test performance is not FDA approved in patients less than 56 years old. Performed at [facility], [place] [address], [place], [place] [address]      Time coordinating discharge: 35 minutes  SIGNED:   Barb Merino, MD  [facility] 06/26/2022, 10:08 AM

## 2022-06-26 NOTE — TOC Transition Note (Signed)
Transition of Care [facility]) - CM/SW Discharge Note   Patient Details  Name: [name] MRN: [id number] Date of Birth: August 14, 1956  Transition of [facility]) CM/SW Contact:  Geralynn Ochs, LCSW Phone Number: 06/26/2022, 11:10 AM   Clinical Narrative:   CSW received call from [facility] that patient has been approved for SNF. CSW confirmed with [facility] that bed is available. CSW updated MD, and sent discharge information to [facility]. CSW updated sister, Florentina Jenny, via phone, who is in agreement. Transport scheduled with [facility] for next available.  Nurse to call report to [phone], Room 302.    Final next level of care: Skilled Nursing Facility Barriers to Discharge: Barriers Resolved   Patient Goals and CMS Choice Patient states their goals for this hospitalization and ongoing recovery are:: Pt states she would like to be able to get better. CMS Medicare.gov Compare Post Acute Care list provided to:: Patient Represenative (must comment) Florentina Jenny, sister) Choice offered to / list presented to : Patient, Sibling  Discharge Placement              Patient chooses bed at: [facility] Patient to be transferred to facility by: [place] Name of family member notified: Florentina Jenny Patient and family notified of of transfer: 06/26/22  Discharge Plan and Services     Post Acute Care Choice: [place]                               Social Determinants of Health (SDOH) Interventions     Readmission Risk Interventions     No data to display

## 2022-06-26 NOTE — Progress Notes (Signed)
Occupational Therapy Treatment Patient Details Name: [name] MRN: [id number] DOB: September 22, 1956 Today's Date: 06/26/2022   History of present illness Pt is a 65 y.o. female who presented 06/19/22 with difficulty speaking. Pt found to have acute intraparenchymal hemorrhage centered at the left thalamus with trace localized left-to-right shift. PMH: HTN, numerous strokes in the past both embolic and hemorrhagic currently on ASA and Plavix, migraines, MI, CAD, CKD, HLD, and cardiomyopathy   OT comments  Patient seated up in recliner upon entry. Patient was min assist to stand from recliner to RW with right hand splint and ambulate to sink for grooming tasks. Patient stood at sink with sink for support and required seated break following. Transfer training performed from chair to chair with education on safety and attention to right with min assist. Patient provided setup for self feeding but only took one bite and stated she did not like it. Nursing informed. Patient is expected to discharge today to SNF.   Recommendations for follow up therapy are one component of a multi-disciplinary discharge planning process, led by the attending physician.  Recommendations may be updated based on patient status, additional functional criteria and insurance authorization.    Follow Up Recommendations  Skilled nursing-short term rehab (<3 hours/day)    Assistance Recommended at Discharge Frequent or constant Supervision/Assistance  Patient can return home with the following  A lot of help with bathing/dressing/bathroom;Direct supervision/assist for medications management;Direct supervision/assist for financial management;Assist for transportation;Help with stairs or ramp for entrance;A little help with walking and/or transfers   Equipment Recommendations  None recommended by OT;Other (comment) (defer to next venue)    Recommendations for Other Services      Precautions / Restrictions  Precautions Precautions: Fall Restrictions Weight Bearing Restrictions: No       Mobility Bed Mobility Overal bed mobility: Needs Assistance             General bed mobility comments: patient up in recliner    Transfers Overall transfer level: Needs assistance Equipment used:  (with right hand walker splint) Transfers: Sit to/from Stand Sit to Stand: Min assist           General transfer comment: min assist to steady and manage RW     Balance Overall balance assessment: Needs assistance Sitting-balance support: Feet supported, Single extremity supported Sitting balance-Leahy Scale: Poor Sitting balance - Comments: seated in recliner   Standing balance support: Single extremity supported, Bilateral upper extremity supported, During functional activity Standing balance-Leahy Scale: Poor Standing balance comment: used sink to assist with balance when standing for grooming                           ADL either performed or assessed with clinical judgement   ADL Overall ADL's : Needs assistance/impaired Eating/Feeding: Set up Eating/Feeding Details (indicate cue type and reason): patient was setup for lunch but only took one bite due to dislike of puree Grooming: Wash/dry hands;Wash/dry face;Minimal assistance;Standing Grooming Details (indicate cue type and reason): min assist for balance                 Toilet Transfer: Minimal assistance;Rolling walker (2 wheels) Toilet Transfer Details (indicate cue type and reason): simulated to chair           General ADL Comments: patient demonstrated good balance with transfers and standing at sink with min assist for balance and safety    Extremity/Trunk Assessment Upper Extremity Assessment RUE Deficits / Details:  mild weakness compared to L, able to functionall use for ADL task, shoulder AROM and grip strength WFL RUE Coordination: decreased fine motor LUE Deficits / Details: able to use  functionally for ADL, shoulder AROM and grip strength WFL LUE Coordination: decreased fine motor            Vision       Perception     Praxis      Cognition Arousal/Alertness: Awake/alert Behavior During Therapy: Flat affect Overall Cognitive Status: Difficult to assess Area of Impairment: Problem solving, Awareness, Following commands, Attention                   Current Attention Level: Sustained   Following Commands: Follows one step commands with increased time   Awareness: Intellectual Problem Solving: Slow processing, Decreased initiation, Difficulty sequencing, Requires verbal cues, Requires tactile cues General Comments: followed directions with increased time        Exercises      Shoulder Instructions       General Comments      Pertinent Vitals/ Pain       Pain Assessment Pain Assessment: Faces Faces Pain Scale: No hurt Pain Intervention(s): Monitored during session  Home Living                                          Prior Functioning/Environment              Frequency  Min 2X/week        Progress Toward Goals  OT Goals(current goals can now be found in the care plan section)  Progress towards OT goals: Progressing toward goals  Acute Rehab OT Goals Patient Stated Goal: get better OT Goal Formulation: With patient Time For Goal Achievement: 07/06/22 Potential to Achieve Goals: Good ADL Goals Pt Will Perform Upper Body Dressing: with min guard assist;sitting Pt Will Perform Lower Body Dressing: with min assist;sitting/lateral leans;sit to/from stand Pt Will Transfer to Toilet: with min assist;squat pivot transfer;stand pivot transfer;bedside commode Additional ADL Goal #1: pt will complete bed mobility ind in prep for ADLs  Plan Frequency remains appropriate;Discharge plan needs to be updated    Co-evaluation                 AM-PAC OT "6 Clicks" Daily Activity     Outcome Measure   Help  from another person eating meals?: A Little Help from another person taking care of personal grooming?: A Little Help from another person toileting, which includes using toliet, bedpan, or urinal?: Total Help from another person bathing (including washing, rinsing, drying)?: A Lot Help from another person to put on and taking off regular upper body clothing?: A Lot Help from another person to put on and taking off regular lower body clothing?: A Lot 6 Click Score: 13    End of Session Equipment Utilized During Treatment: Gait belt;Rolling walker (2 wheels)  OT Visit Diagnosis: Unsteadiness on feet (R26.81);Other abnormalities of gait and mobility (R26.89);Muscle weakness (generalized) (M62.81);Other symptoms and signs involving cognitive function;Cognitive communication deficit (R41.841)   Activity Tolerance Patient tolerated treatment well   Patient Left in chair;with call bell/phone within reach;with chair alarm set   Nurse Communication Mobility status        Time: 8657-8469 OT Time Calculation (min): 27 min  Charges: OT General Charges $OT Visit: 1 Visit OT Treatments $Self Care/Home Management : 23-37  mins  Lodema Hong, OTA Acute Rehabilitation Services  Office [phone]   Trixie Dredge 06/26/2022, 1:20 PM

## 2022-07-01 DIAGNOSIS — I5022 Chronic systolic (congestive) heart failure: Secondary | ICD-10-CM | POA: Diagnosis not present

## 2022-07-01 DIAGNOSIS — E119 Type 2 diabetes mellitus without complications: Secondary | ICD-10-CM | POA: Diagnosis not present

## 2022-07-01 DIAGNOSIS — I10 Essential (primary) hypertension: Secondary | ICD-10-CM | POA: Diagnosis not present

## 2022-07-01 DIAGNOSIS — E785 Hyperlipidemia, unspecified: Secondary | ICD-10-CM | POA: Diagnosis not present

## 2022-07-01 DIAGNOSIS — I610 Nontraumatic intracerebral hemorrhage in hemisphere, subcortical: Secondary | ICD-10-CM | POA: Diagnosis not present

## 2022-07-01 DIAGNOSIS — R471 Dysarthria and anarthria: Secondary | ICD-10-CM | POA: Diagnosis not present

## 2022-07-01 DIAGNOSIS — N182 Chronic kidney disease, stage 2 (mild): Secondary | ICD-10-CM | POA: Diagnosis not present

## 2022-07-01 DIAGNOSIS — R1310 Dysphagia, unspecified: Secondary | ICD-10-CM | POA: Diagnosis not present

## 2022-07-03 NOTE — Progress Notes (Signed)
Carelink Summary Report / Loop Recorder 

## 2022-07-06 ENCOUNTER — Ambulatory Visit (INDEPENDENT_AMBULATORY_CARE_PROVIDER_SITE_OTHER): Payer: No Typology Code available for payment source

## 2022-07-06 DIAGNOSIS — I639 Cerebral infarction, unspecified: Secondary | ICD-10-CM | POA: Diagnosis not present

## 2022-07-06 LAB — CUP PACEART REMOTE DEVICE CHECK
Date Time Interrogation Session: 20230817231133
Implantable Pulse Generator Implant Date: 20221019

## 2022-07-10 DIAGNOSIS — M6281 Muscle weakness (generalized): Secondary | ICD-10-CM | POA: Diagnosis not present

## 2022-07-10 DIAGNOSIS — I25118 Atherosclerotic heart disease of native coronary artery with other forms of angina pectoris: Secondary | ICD-10-CM | POA: Diagnosis not present

## 2022-07-10 DIAGNOSIS — I5022 Chronic systolic (congestive) heart failure: Secondary | ICD-10-CM | POA: Diagnosis not present

## 2022-07-10 DIAGNOSIS — I10 Essential (primary) hypertension: Secondary | ICD-10-CM | POA: Diagnosis not present

## 2022-07-10 DIAGNOSIS — I610 Nontraumatic intracerebral hemorrhage in hemisphere, subcortical: Secondary | ICD-10-CM | POA: Diagnosis not present

## 2022-07-15 DIAGNOSIS — F32A Depression, unspecified: Secondary | ICD-10-CM | POA: Diagnosis not present

## 2022-07-15 DIAGNOSIS — Z8673 Personal history of transient ischemic attack (TIA), and cerebral infarction without residual deficits: Secondary | ICD-10-CM | POA: Diagnosis not present

## 2022-07-15 DIAGNOSIS — G4733 Obstructive sleep apnea (adult) (pediatric): Secondary | ICD-10-CM | POA: Diagnosis not present

## 2022-07-15 DIAGNOSIS — I619 Nontraumatic intracerebral hemorrhage, unspecified: Secondary | ICD-10-CM | POA: Diagnosis not present

## 2022-07-15 DIAGNOSIS — I69322 Dysarthria following cerebral infarction: Secondary | ICD-10-CM | POA: Diagnosis not present

## 2022-07-19 NOTE — Progress Notes (Deleted)
No show

## 2022-07-20 ENCOUNTER — Ambulatory Visit
Payer: No Typology Code available for payment source | Attending: Cardiovascular Disease | Admitting: Cardiovascular Disease

## 2022-07-20 ENCOUNTER — Encounter: Payer: Self-pay | Admitting: Cardiovascular Disease

## 2022-07-20 DIAGNOSIS — I5022 Chronic systolic (congestive) heart failure: Secondary | ICD-10-CM

## 2022-07-20 DIAGNOSIS — I255 Ischemic cardiomyopathy: Secondary | ICD-10-CM

## 2022-07-20 DIAGNOSIS — E785 Hyperlipidemia, unspecified: Secondary | ICD-10-CM

## 2022-07-20 DIAGNOSIS — I2510 Atherosclerotic heart disease of native coronary artery without angina pectoris: Secondary | ICD-10-CM

## 2022-07-20 DIAGNOSIS — I639 Cerebral infarction, unspecified: Secondary | ICD-10-CM

## 2022-07-20 DIAGNOSIS — I10 Essential (primary) hypertension: Secondary | ICD-10-CM

## 2022-07-21 DIAGNOSIS — B370 Candidal stomatitis: Secondary | ICD-10-CM | POA: Diagnosis not present

## 2022-07-23 DIAGNOSIS — I10 Essential (primary) hypertension: Secondary | ICD-10-CM | POA: Diagnosis not present

## 2022-07-24 DIAGNOSIS — F32A Depression, unspecified: Secondary | ICD-10-CM | POA: Diagnosis not present

## 2022-07-24 DIAGNOSIS — N182 Chronic kidney disease, stage 2 (mild): Secondary | ICD-10-CM | POA: Diagnosis not present

## 2022-07-24 DIAGNOSIS — I5022 Chronic systolic (congestive) heart failure: Secondary | ICD-10-CM | POA: Diagnosis not present

## 2022-07-24 DIAGNOSIS — I10 Essential (primary) hypertension: Secondary | ICD-10-CM | POA: Diagnosis not present

## 2022-07-24 DIAGNOSIS — K219 Gastro-esophageal reflux disease without esophagitis: Secondary | ICD-10-CM | POA: Diagnosis not present

## 2022-07-24 DIAGNOSIS — I480 Paroxysmal atrial fibrillation: Secondary | ICD-10-CM | POA: Diagnosis not present

## 2022-07-24 DIAGNOSIS — E785 Hyperlipidemia, unspecified: Secondary | ICD-10-CM | POA: Diagnosis not present

## 2022-07-24 DIAGNOSIS — E119 Type 2 diabetes mellitus without complications: Secondary | ICD-10-CM | POA: Diagnosis not present

## 2022-07-27 ENCOUNTER — Telehealth: Payer: Self-pay | Admitting: Physician Assistant

## 2022-07-27 NOTE — Telephone Encounter (Signed)
To Dr. Rockey Situ to review.   The patient was last seen by Christell Faith, PA in May 2023.  She has no current follow up appointments scheduled. She no showed for scheduled appointments with Dr. Rockey Situ on 05/13/22 & 07/20/22.

## 2022-07-27 NOTE — Telephone Encounter (Signed)
Please advise on refill. Clopidogrel put on hold at hospital discharge and removed from med list. Dose provider want patient to continue to hold? Thank you!  Acute left thalamic hemorrhage with dysarthria while on DAPT with plavix and ASA.  Thought to be secondary to hypertensive in etiology. Code Stroke was called in and CT scan of the head showed acute left thalamic hemorrhage with surrounding edema and minor mass effect.  Follow-up CT scan and MRIs were done during hospitalization.  2D Echo reviewed with LV ejection fraction of 65 to 70% with grade 1 diastolic dysfunction.  Hemoglobin A1c of 6.2, LDL 90.   -Patient on aspirin and Plavix before coming to the hospital.  Neurology recommended to resume aspirin on discharge.  Continue to hold Plavix.

## 2022-07-29 NOTE — Progress Notes (Signed)
Carelink Summary Report / Loop Recorder 

## 2022-07-29 NOTE — Telephone Encounter (Signed)
Spoke with pharmacist Jellino. Reviewed that this medication was discontinued and she should only be on aspirin. He repeated back instructions with no further questions.

## 2022-07-29 NOTE — Telephone Encounter (Signed)
Attempted to reach out to patient but due to screening it would not allow call. Pharmacy has been updated

## 2022-07-30 DIAGNOSIS — I619 Nontraumatic intracerebral hemorrhage, unspecified: Secondary | ICD-10-CM | POA: Diagnosis not present

## 2022-07-30 DIAGNOSIS — F32A Depression, unspecified: Secondary | ICD-10-CM | POA: Diagnosis not present

## 2022-07-30 DIAGNOSIS — K219 Gastro-esophageal reflux disease without esophagitis: Secondary | ICD-10-CM | POA: Diagnosis not present

## 2022-07-30 DIAGNOSIS — I5022 Chronic systolic (congestive) heart failure: Secondary | ICD-10-CM | POA: Diagnosis not present

## 2022-07-30 DIAGNOSIS — E785 Hyperlipidemia, unspecified: Secondary | ICD-10-CM | POA: Diagnosis not present

## 2022-07-30 DIAGNOSIS — I10 Essential (primary) hypertension: Secondary | ICD-10-CM | POA: Diagnosis not present

## 2022-07-30 DIAGNOSIS — R1310 Dysphagia, unspecified: Secondary | ICD-10-CM | POA: Diagnosis not present

## 2022-08-03 DIAGNOSIS — M6281 Muscle weakness (generalized): Secondary | ICD-10-CM | POA: Diagnosis not present

## 2022-08-03 DIAGNOSIS — I69391 Dysphagia following cerebral infarction: Secondary | ICD-10-CM | POA: Diagnosis not present

## 2022-08-03 DIAGNOSIS — I69320 Aphasia following cerebral infarction: Secondary | ICD-10-CM | POA: Diagnosis not present

## 2022-08-03 DIAGNOSIS — R278 Other lack of coordination: Secondary | ICD-10-CM | POA: Diagnosis not present

## 2022-08-03 DIAGNOSIS — R2689 Other abnormalities of gait and mobility: Secondary | ICD-10-CM | POA: Diagnosis not present

## 2022-08-14 ENCOUNTER — Telehealth: Payer: Self-pay

## 2022-08-14 NOTE — Telephone Encounter (Signed)
Unable to leave a VM/   LINQ alert received.  1 new AF event, 19mn in duration, mean HR 94 EGM with likely AF, some ectopy  Per Dr. LQuentin Oreepisode real f/u with AF clinic to discuss [facility]

## 2022-08-15 IMAGING — CT CT HEAD W/O CM
3 series · 16 of 47 positions shown, 19 images · non-contrast
Comparison: 04/09/2021

CLINICAL DATA: Intracranial hemorrhage follow-up

EXAM:
CT HEAD WITHOUT CONTRAST
TECHNIQUE: Contiguous axial images were obtained from the base of the skull
through the vertex without intravenous contrast.

[Series 2: head wo · axial · 0.42mm/px · z∈[-97,+28]mm · 10 of 31 slices shown, 13 images]
[im 3/31  brain]
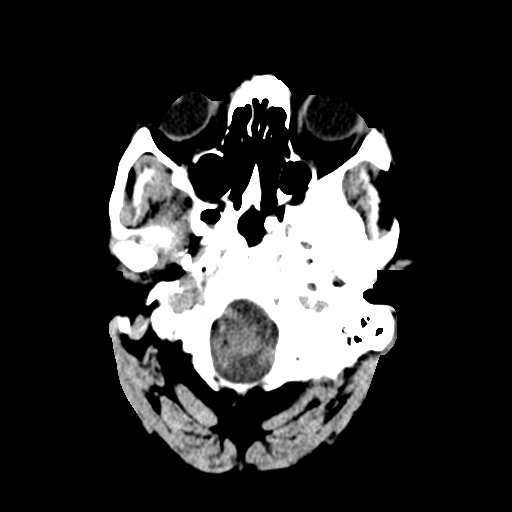
[im 3/31  bone]
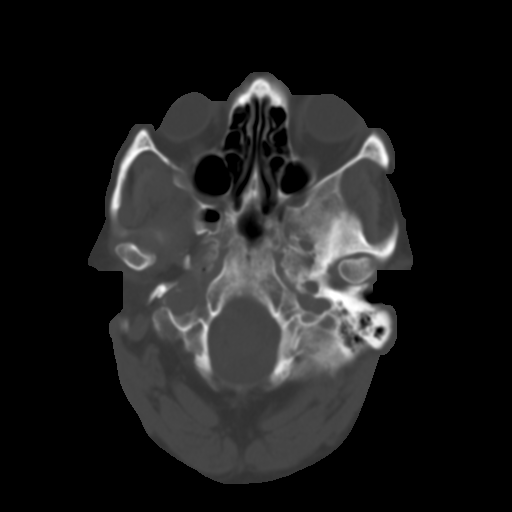
[im 6/31  brain]
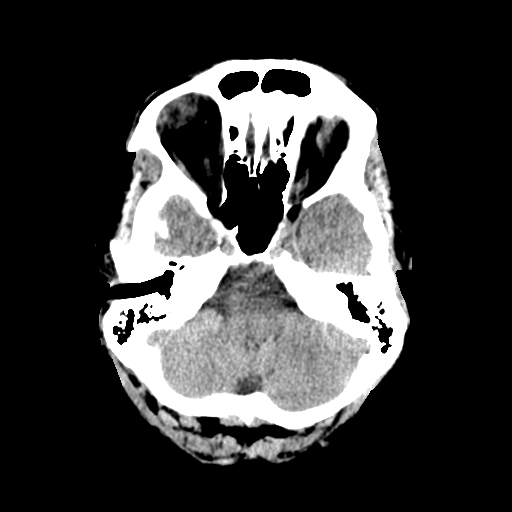
[im 9/31  brain]
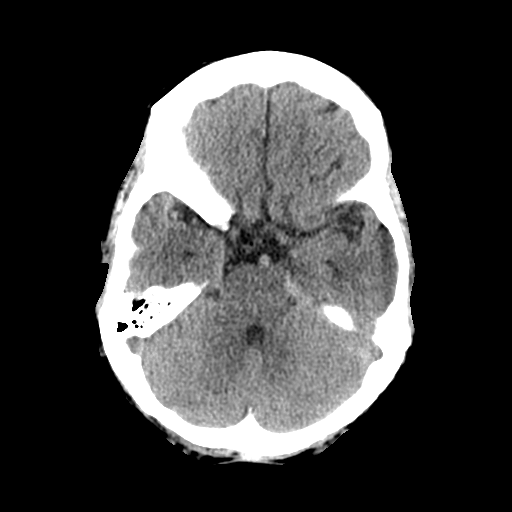
[im 11/31  brain]
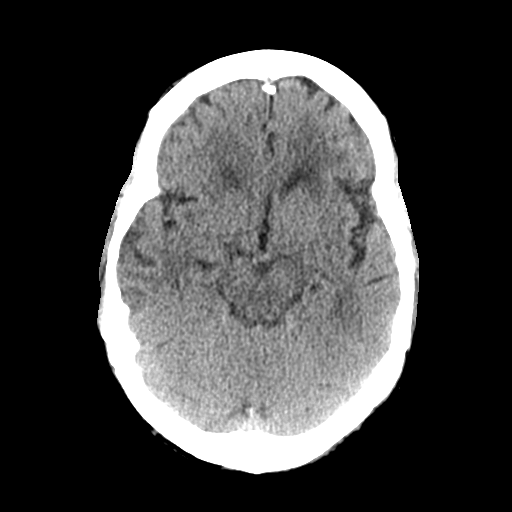
[im 14/31  brain]
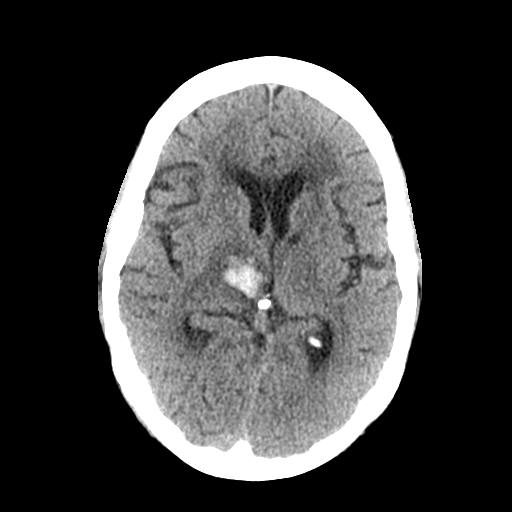
[im 14/31  bone]
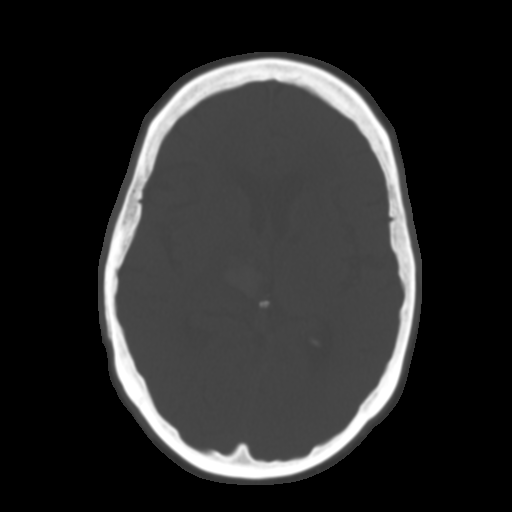
[im 17/31  brain]
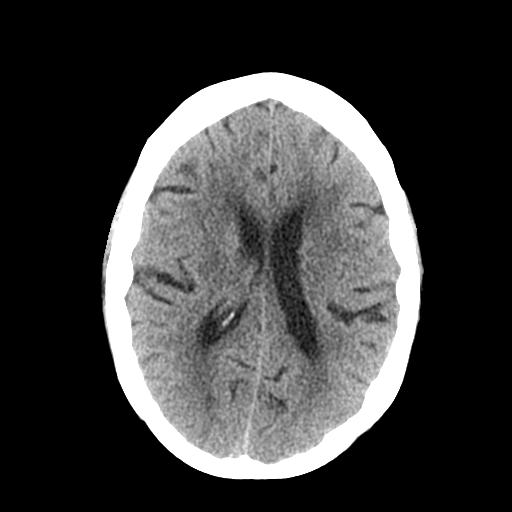
[im 20/31  brain]
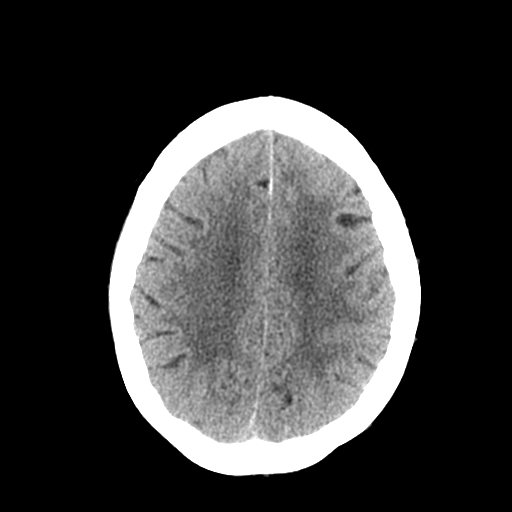
[im 23/31  brain]
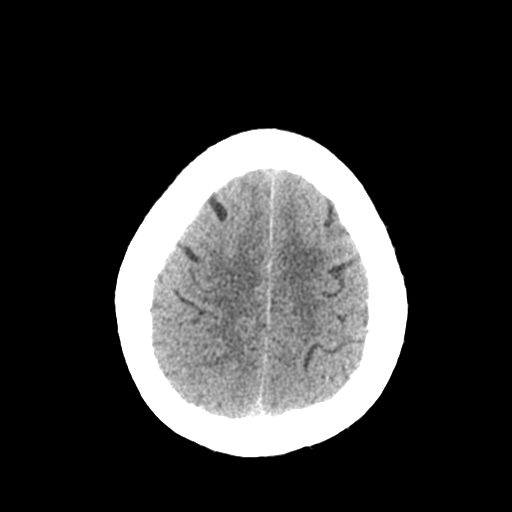
[im 25/31  brain]
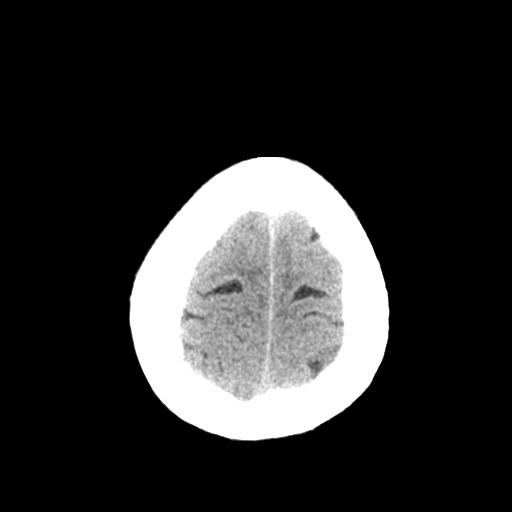
[im 25/31  bone]
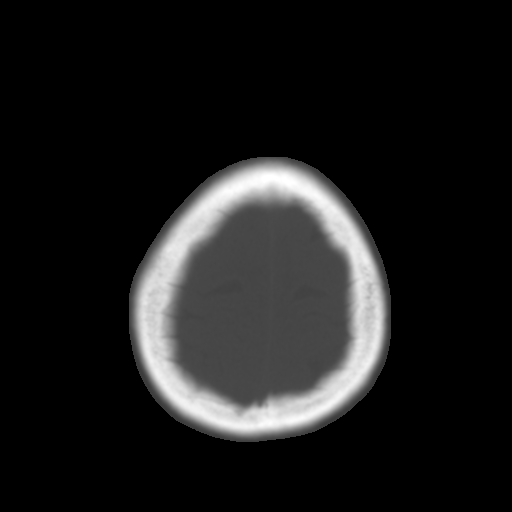
[im 28/31  brain]
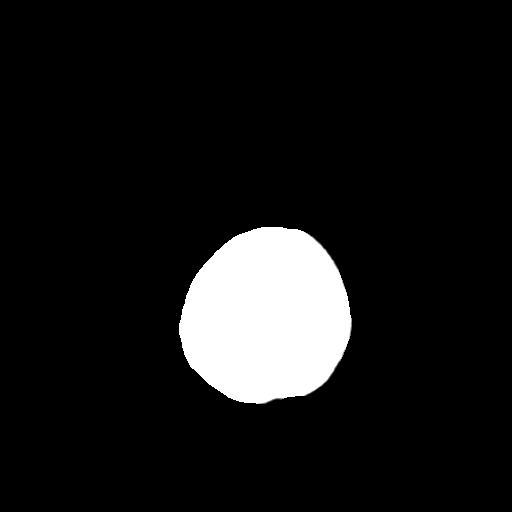

[Series 4: coronal soft tissue · coronal · 0.33mm/px · 3 of 72 slices shown]
[im 24/72  brain]
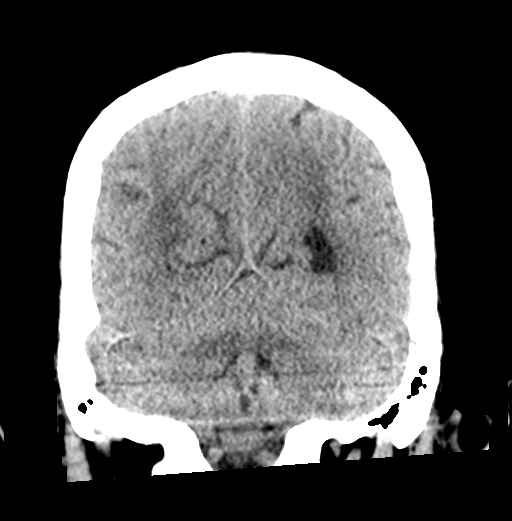
[im 32/72  brain]
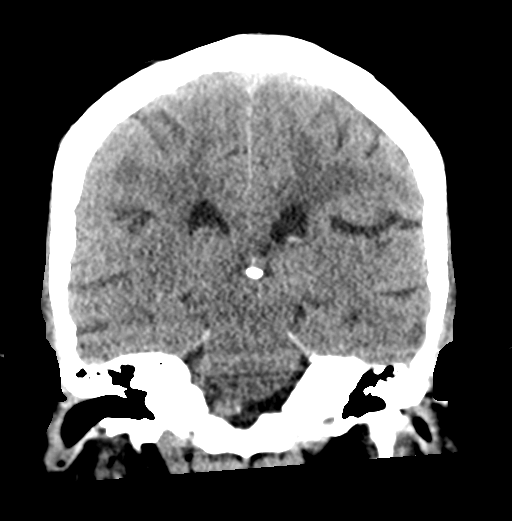
[im 40/72  brain]
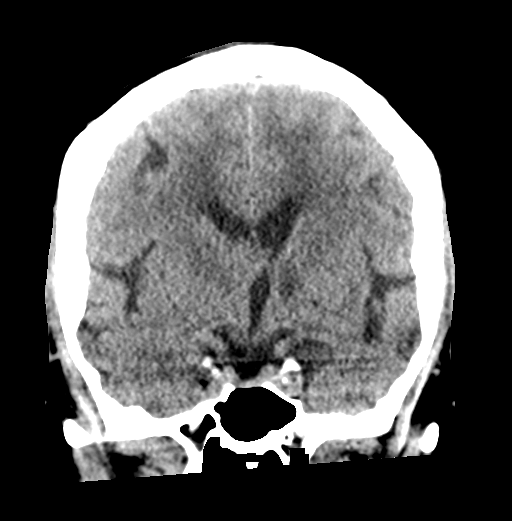

[Series 5: sagittal soft tissue · sagittal · 0.32mm/px · 3 of 57 slices shown]
[im 21/57  brain]
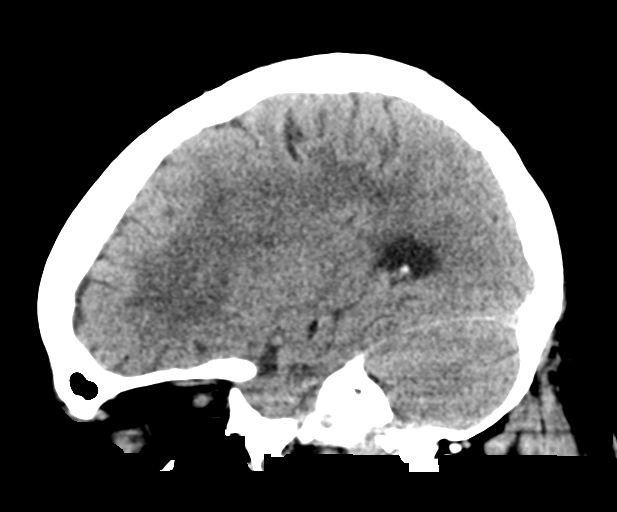
[im 29/57  brain]
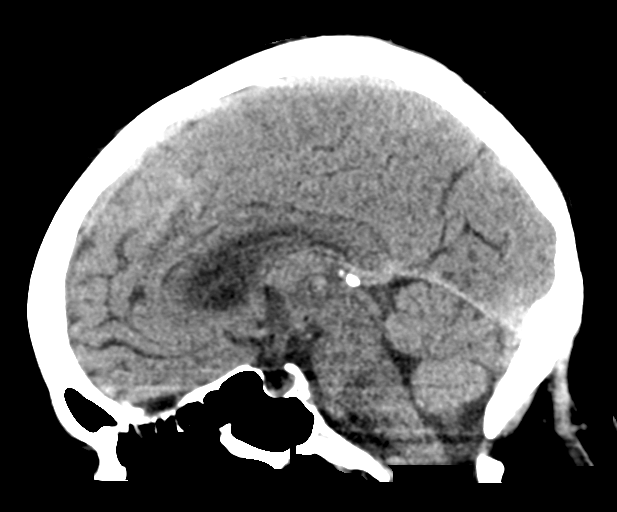
[im 37/57  brain]
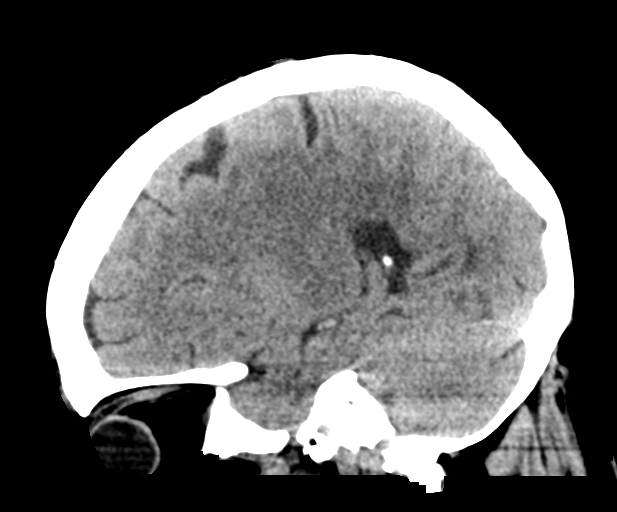

[16 of 47 positions shown; findings below may reference images not displayed]

FINDINGS: Brain: Unchanged size of intraparenchymal hemorrhage in the right
thalamus. Trace leftward midline shift is unchanged. Old left
caudate infarct. There is periventricular hypoattenuation compatible
with chronic microvascular disease.

Vascular: No abnormal hyperdensity of the major intracranial
arteries or dural venous sinuses. No intracranial atherosclerosis.

Skull: The visualized skull base, calvarium and extracranial soft
tissues are normal.

Sinuses/Orbits: No fluid levels or advanced mucosal thickening of
the visualized paranasal sinuses. No mastoid or middle ear effusion.
The orbits are normal.
IMPRESSION: Unchanged size of right thalamic intraparenchymal hemorrhage with
trace leftward midline shift.

## 2022-08-15 IMAGING — MR MR MRA HEAD W/O CM
1 series · 20 of 48 positions shown · non-contrast
Comparison: No pertinent prior exam.

CLINICAL DATA: Intraparenchymal hemorrhage of the right thalamus

EXAM:
MRA HEAD WITHOUT CONTRAST
TECHNIQUE: Angiographic images of the Circle of Willis were acquired using MRA
technique without intravenous contrast.

[Series 21: 3d cow · axial · 0.8mm · 0.41mm/px · z∈[-112,-20]mm · 20 of 130 slices shown]
[im 1/130]
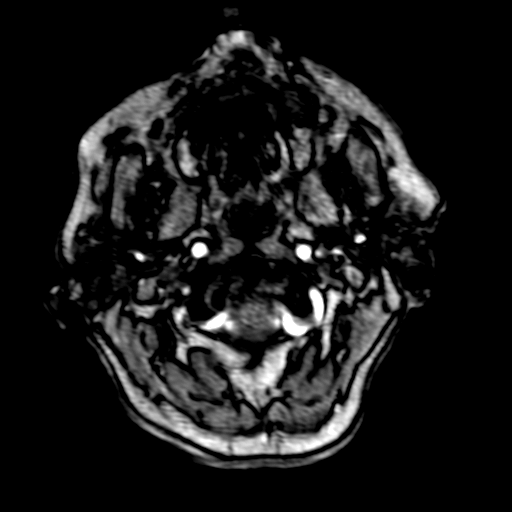
[im 3/130]
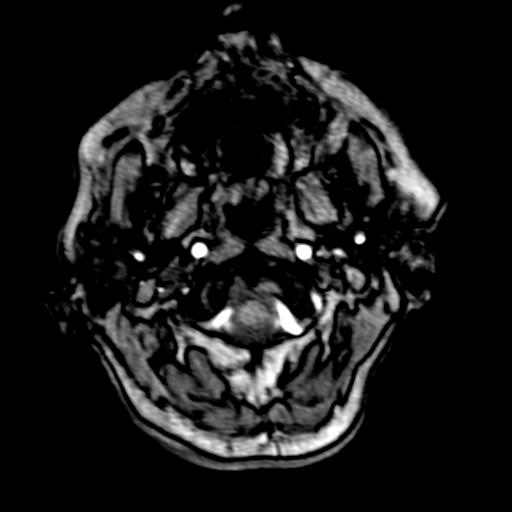
[im 6/130]
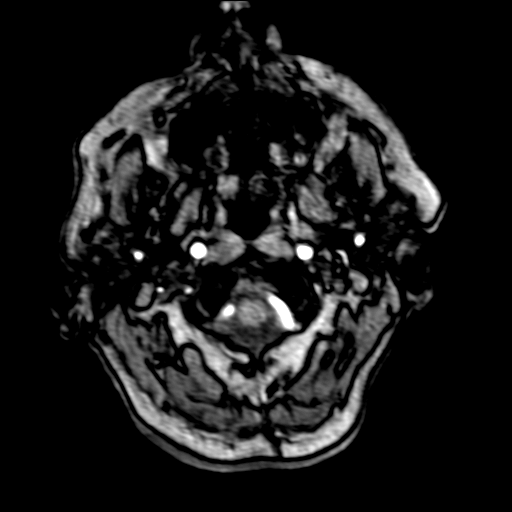
[im 9/130]
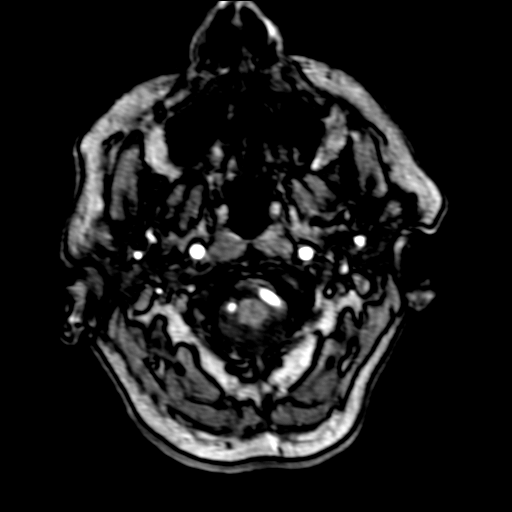
[im 11/130]
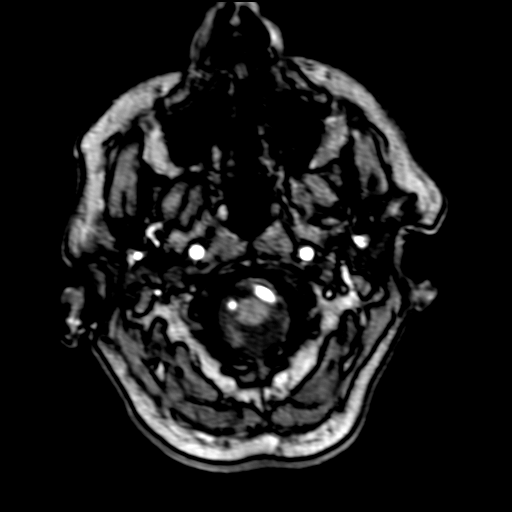
[im 14/130]
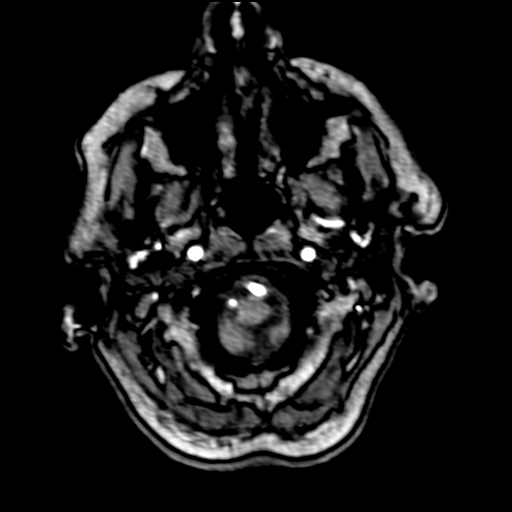
[im 17/130]
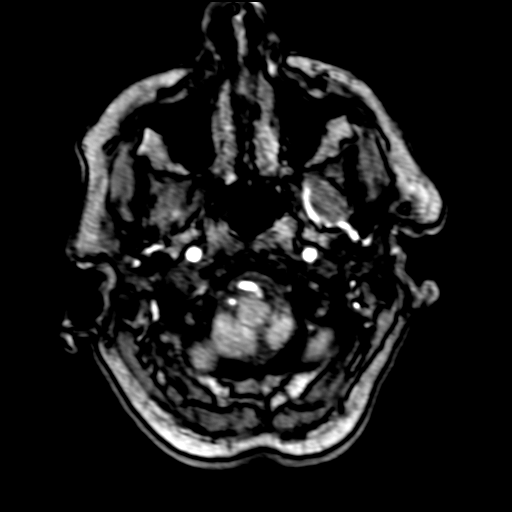
[im 20/130]
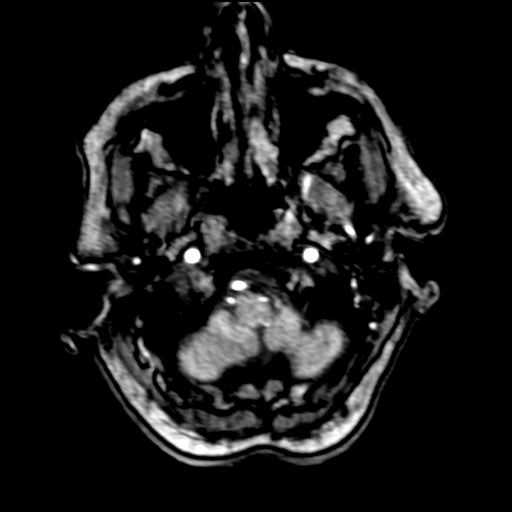
[im 22/130]
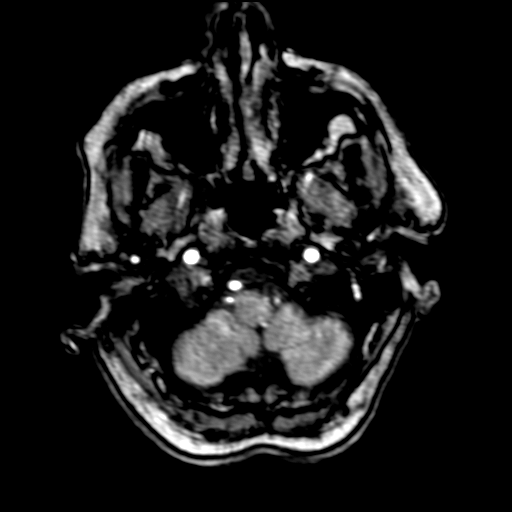
[im 25/130]
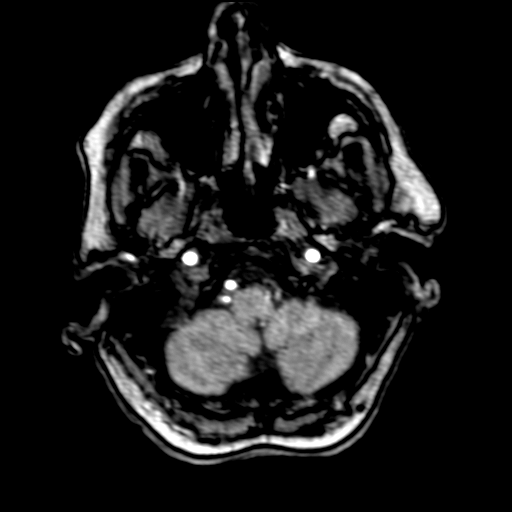
[im 28/130]
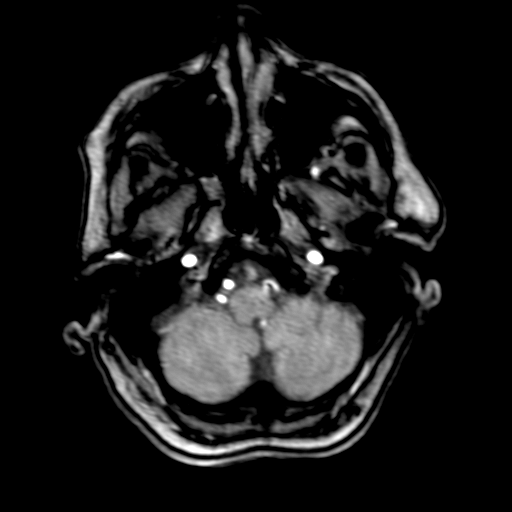
[im 31/130]
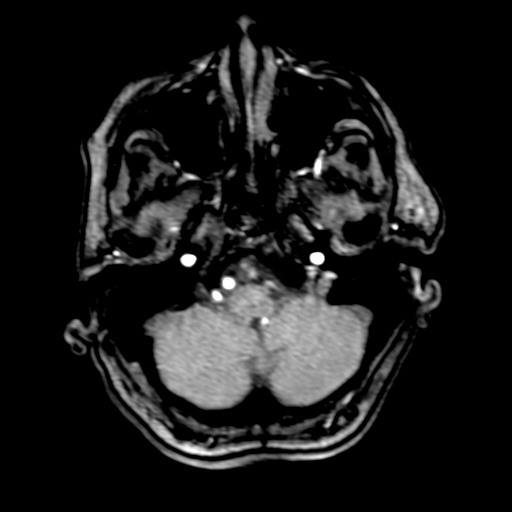
[im 42/130]
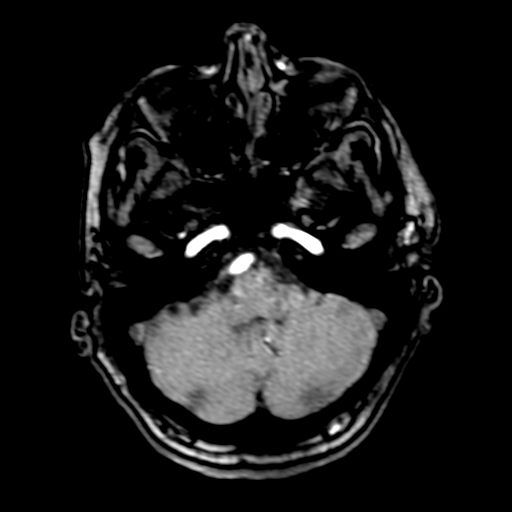
[im 58/130]
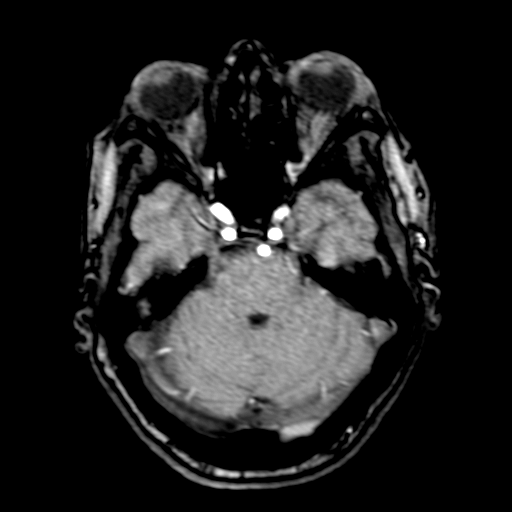
[im 66/130]
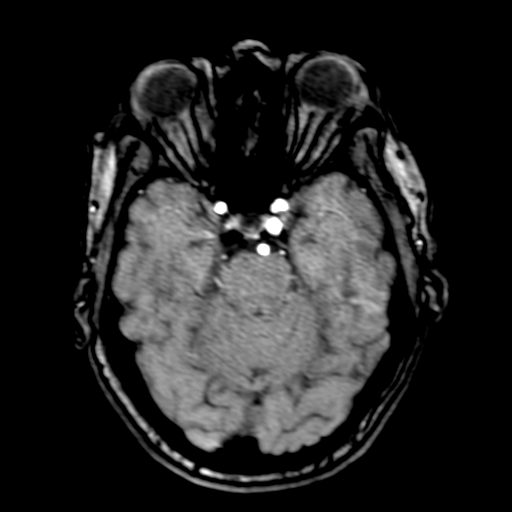
[im 75/130]
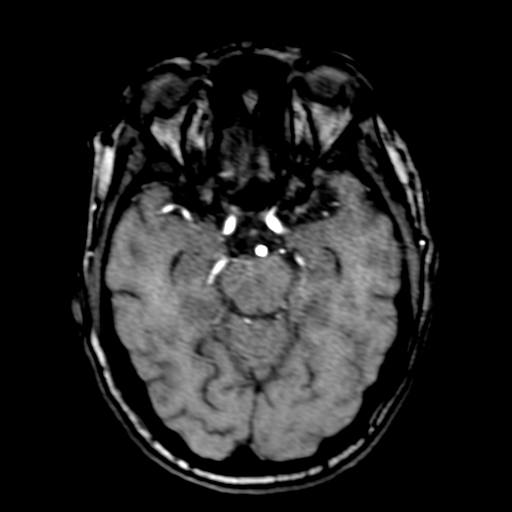
[im 91/130]
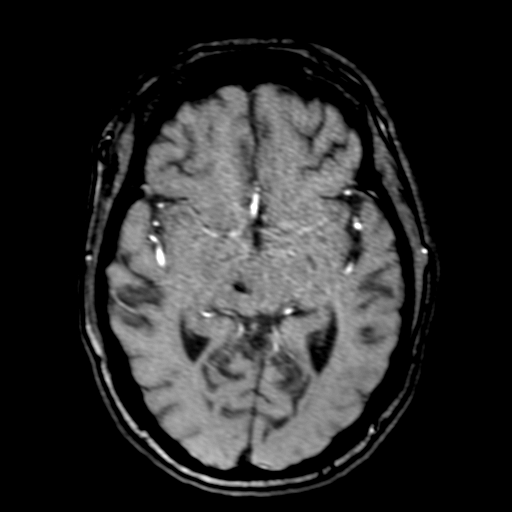
[im 108/130]
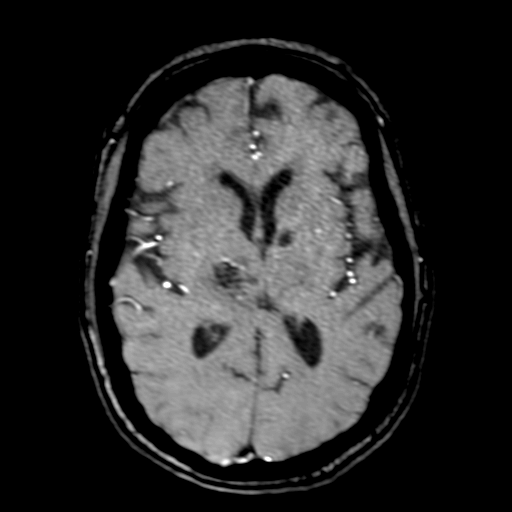
[im 110/130]
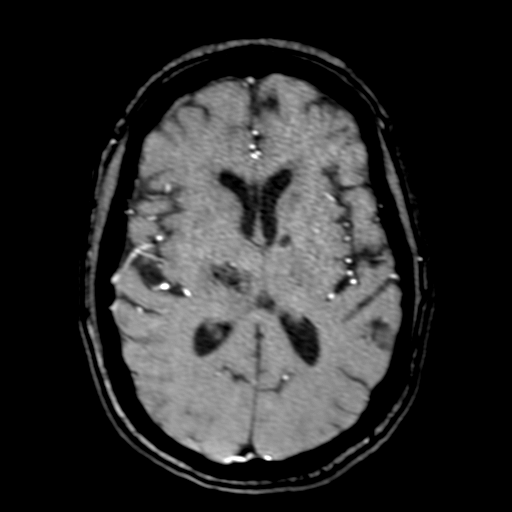
[im 124/130]
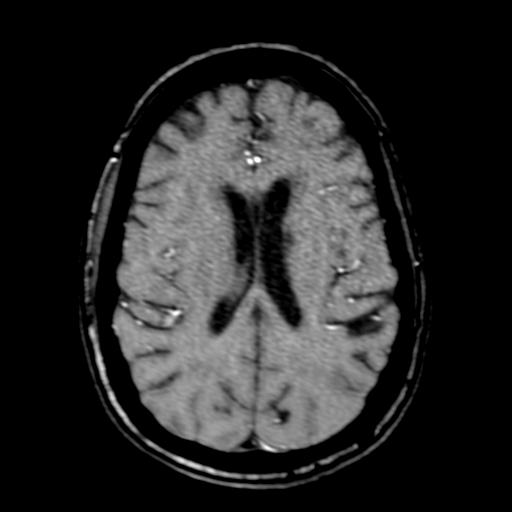

[20 of 48 positions shown; findings below may reference images not displayed]

FINDINGS: POSTERIOR CIRCULATION:

--Vertebral arteries: Normal

--Inferior cerebellar arteries: Poor visualization right PICA.
Normal left PICA.

--Basilar artery: Normal.

--Superior cerebellar arteries: Normal.

--Posterior cerebral arteries: Multifocal moderate stenosis of the
left PCA.

ANTERIOR CIRCULATION:

--Intracranial internal carotid arteries: Normal.

--Anterior cerebral arteries (ACA): Normal.

--Middle cerebral arteries (MCA): Normal.

ANATOMIC VARIANTS: None
IMPRESSION: 1. No proximal large vessel occlusion or high-grade stenosis.
2. Multifocal moderate stenosis of the left PCA.

## 2022-08-17 NOTE — Telephone Encounter (Signed)
Second attempt to contact patient. No answer and unable to leave VM.

## 2022-08-19 NOTE — Telephone Encounter (Signed)
Spoke with Florentina Jenny she stated that patient is at [facility], updated patients contact information left VM with scheduler to call back, to schedule appointment in [place] with Dr. Rockey Situ or Quentin Ore first available to discuss [place] due AF on [facility]

## 2022-08-19 NOTE — Telephone Encounter (Signed)
The patient is scheduled for 08/31/22 with Dr. Rockey Situ.

## 2022-08-25 DIAGNOSIS — I10 Essential (primary) hypertension: Secondary | ICD-10-CM | POA: Diagnosis not present

## 2022-08-25 DIAGNOSIS — K219 Gastro-esophageal reflux disease without esophagitis: Secondary | ICD-10-CM | POA: Diagnosis not present

## 2022-08-25 DIAGNOSIS — F32A Depression, unspecified: Secondary | ICD-10-CM | POA: Diagnosis not present

## 2022-08-25 DIAGNOSIS — I619 Nontraumatic intracerebral hemorrhage, unspecified: Secondary | ICD-10-CM | POA: Diagnosis not present

## 2022-08-25 DIAGNOSIS — R1310 Dysphagia, unspecified: Secondary | ICD-10-CM | POA: Diagnosis not present

## 2022-08-25 DIAGNOSIS — E785 Hyperlipidemia, unspecified: Secondary | ICD-10-CM | POA: Diagnosis not present

## 2022-08-25 DIAGNOSIS — I5022 Chronic systolic (congestive) heart failure: Secondary | ICD-10-CM | POA: Diagnosis not present

## 2022-08-30 NOTE — Progress Notes (Unsigned)
Cardiology Office Note  Date:  08/31/2022   ID:  [name], [name] [birth year], MRN [id number]  PCP:  [name] Hire, MD   Chief Complaint  Patient presents with   [facility] follow up; CVA     Medications reviewed by the patient's medication list from [facility].      HPI:  [name] is a 66 year old woman with long history of  smoking who stopped in May 2014,  coronary artery disease with non-ST elevation in may 2015 with stent placed to her distal RCA,  stroke October 2014 evaluated at [facility].  Previous anginal symptoms included back pain, chest pain like an elephant sitting on her chest cardiac catheterization performed for anginal symptoms, Cath 08/19/2016:, No intervention performed  Numerous CVAs She presents for routine followup of her coronary artery disease.   Last seen by myself in clinic August 2022 Last seen in clinic May 2023 On that visit amlodipine held for possible lower extremity edema  Prior stroke history reviewed stroke October 2014 evaluated at [facility].   In the hospital 04/2021 with right thalamic hemorrhagic CVA with right to left midline shift felt to be in the setting of hypertensive emergency  In the hospital 06/2021 with a small acute left cerebral infarct and a subacute to chronic right corona radiata lacunar infarct.   In the hospital  07/2021 with small acute to subacute infarct in the left temporal lobe that was new compared to study in 06/2021.TEE which was negative for intracardiac shunt.  In the hospital August 2023 with stroke woke up on the morning of 06/19/2022 unable to speak and EMS was called. Was on aspirin Plavix at the time Concern for hypertensive etiology, CT scan of the head showed acute left thalamic hemorrhage  Plavix was held at discharge Discharged on amlodipine, metoprolol and lisinopril.  No arrhythmia on loop monitor Did not have a download September 2023 as she is staying at [facility] for rehab  Echocardiogram August  2023 EF 65 to 70%,, normal RV function  Of note she reports on Saturday, August 29, 2022 she developed left facial numbness, nausea, vomiting, after vomiting symptoms seem to improve somewhat and have been slowly improving yesterday and today Today with mild residual numbness feeling left face She is declining trip to the emergency room, would like to go home No further nausea vomiting yesterday or today  EKG personally reviewed by myself on todays visit Normal sinus rhythm rate 49 bpm nonspecific T wave abnormality  Other past medical history reviewed covid positive sept 2020  cardiac catheterization  08/19/2016: Left anterior descending (LAD):30% proximal LAD disease, 40% mid LAD disease. 70% ostial and proximal disease of the D2 and D3 vessel Right coronary artery (RCA):40 to 50% ostial disease, 30% proximal disease.  Left ventriculography: Left ventricular systolic function is normal, LVEF is estimated at 55-65%, there is no significant mitral regurgitation , no significant aortic valve stenosis  Stable mild to moderate LAD disease Moderate to severe ostial and proximal diagonal disease (unchanged or improved from previous study) Mild to Moderate ostial RCA disease, mild proximal RCA disease Normal EF No intervention performed   she was noted to have some coronary spasm that resolved with nitroglycerin    hospitalization 12/31/2013 where she presented with angina. He had a catheterization with distal RCA stent placed.  Also started on ranexa, continued on isosorbide.    Previous Cardiac catheterization report 01/01/2014 details 95% distal RCA lesion with stent placed. Also had 40% mid LAD, 30%  distal LAD, 80% D1 disease, 60% D2 disease, 50% ostial RCA disease, 50% proximal RCA disease, 60% mid RCA disease stent placed was a xience  EX 2.5 millimeter stent   Cardiac catheterization in 03/16/2013 details occluded mid RCA with collaterals from distal LAD also 75% stenosis diagonal #1  near the ostium Ejection fraction at that time was 46% notes also detailed 40% mid LAD, 30% distal LAD, 50% D2 disease, 30% proximal circumflex, 75% proximal RCA disease  PMH:   has a past medical history of Anxiety, Arthritis, CAD (coronary artery disease), Carotid disease, bilateral ([place]), Chronic kidney disease, Complication of anesthesia, CVA (cerebral vascular accident) ([place]), CVA (cerebral vascular accident) ([place]), Decreased libido, Depression, GERD (gastroesophageal reflux disease), History of 2019 novel coronavirus disease (COVID-19) (07/2019), History of recurrent UTIs, Hypercholesteremia, Hyperlipemia, Hypertension, Insomnia, Ischemic cardiomyopathy, Menopause, Migraines, Myocardial infarction ([place]), PONV (postoperative nausea and vomiting), Right lower quadrant pain, Syncope and collapse, Tobacco abuse, and Vaginal atrophy.  PSH:    Past Surgical History:  Procedure Laterality Date   CARDIAC CATHETERIZATION  01/01/2014   CARDIAC CATHETERIZATION  03/16/2013   CARDIAC CATHETERIZATION  12/2013   CARDIAC CATHETERIZATION N/A 08/19/2016   Procedure: Left Heart Cath and Coronary Angiography;  Surgeon: Minna Merritts, MD;  Location: [place] [facility];  Service: Cardiovascular;  Laterality: N/A;   CESAREAN SECTION WITH BILATERAL TUBAL LIGATION     CORONARY ANGIOPLASTY WITH STENT PLACEMENT  01/01/2014   95% lesion with a drug eluting stent to the distal RCA.   ENDOMETRIAL ABLATION     KNEE ARTHROSCOPY  11/27/2006   left knee    OOPHORECTOMY     TEE WITHOUT CARDIOVERSION N/A 07/25/2021   Procedure: TRANSESOPHAGEAL ECHOCARDIOGRAM (TEE);  Surgeon: Minna Merritts, MD;  Location: [facility] ORS;  Service: Cardiovascular;  Laterality: N/A;   TOTAL KNEE ARTHROPLASTY Left 10/06/2016   Procedure: TOTAL KNEE ARTHROPLASTY;  Surgeon: Corky Mull, MD;  Location: [facility] ORS;  Service: Orthopedics;  Laterality: Left;   TOTAL KNEE ARTHROPLASTY Right 11/12/2020   Procedure: TOTAL KNEE ARTHROPLASTY;  Surgeon:  Corky Mull, MD;  Location: [facility] ORS;  Service: Orthopedics;  Laterality: Right;   TUBAL LIGATION      Current Outpatient Medications  Medication Sig Dispense Refill   amLODipine (NORVASC) 10 MG tablet Take 1 tablet (10 mg total) by mouth daily. 90 tablet 3   aspirin EC 81 MG EC tablet Take 1 tablet (81 mg total) by mouth daily. Swallow whole. 30 tablet 0   FLUoxetine (PROZAC) 20 MG capsule Take 20 mg by mouth daily.     furosemide (LASIX) 20 MG tablet TAKE 1 TABLET(20 MG) BY MOUTH DAILY 90 tablet 2   lisinopril (ZESTRIL) 5 MG tablet Take 1 tablet (5 mg total) by mouth daily.     metoprolol succinate (TOPROL-XL) 50 MG 24 hr tablet TAKE 1 TABLET(50 MG) BY MOUTH DAILY WITH OR IMMEDIATELY FOLLOWING A MEAL 90 tablet 0   nitroGLYCERIN (NITROSTAT) 0.4 MG SL tablet Place 1 tablet (0.4 mg total) under the tongue every 5 (five) minutes as needed for chest pain (If you experience chest pain: Place 1 nitroglycerin under your tongue, while sitting. If no relief of pain, may repeat 1 tablet every 5 minutes up to 3 tablets over 15 minutes. If no relief call 911. If you have dizziness/lightheadedness while taking nitroglycerin, stop taking and call 911.). (Patient taking differently: Place 0.4 mg under the tongue every 5 (five) minutes as needed for chest pain.) 25 tablet 0  pantoprazole (PROTONIX) 40 MG tablet Take 40 mg by mouth 2 (two) times daily.     rosuvastatin (CRESTOR) 40 MG tablet Take 1 tablet (40 mg total) by mouth daily. 90 tablet 3   No current facility-administered medications for this visit.    Allergies:   Codeine sulfate and Penicillins   Social History:  The patient  reports that she quit smoking about 9 years ago. Her smoking use included cigarettes. She has a 30.00 pack-year smoking history. She has never used smokeless tobacco. She reports that she does not currently use alcohol. She reports that she does not use drugs.   Family History:   family history includes Breast cancer in her  paternal grandmother; Hyperlipidemia in her mother; Hypertension in her mother, sister, and sister; Stroke in her mother.    Review of Systems: Review of Systems  Constitutional: Negative.   HENT: Negative.    Respiratory: Negative.    Cardiovascular: Negative.   Gastrointestinal: Negative.   Musculoskeletal: Negative.   Neurological: Negative.        Left facial numbness  Psychiatric/Behavioral: Negative.    All other systems reviewed and are negative.   PHYSICAL EXAM: VS:  BP 100/60 (BP Location: Left Arm, Patient Position: Sitting, Cuff Size: Normal)   Pulse (!) 49   Ht '5\' 2"'$  (1.575 m)   Wt 123 lb (55.8 kg)   SpO2 94%   BMI 22.50 kg/m  , BMI Body mass index is 22.5 kg/m. Constitutional:  oriented to person, place, and time. No distress.  Presenting in a wheelchair HENT:  Head: Grossly normal Eyes:  no discharge. No scleral icterus.  Neck: No JVD, no carotid bruits  Cardiovascular: Regular rate and rhythm, no murmurs appreciated Pulmonary/Chest: Clear to auscultation bilaterally, no wheezes or rails Abdominal: Soft.  no distension.  no tenderness.  Musculoskeletal: Normal range of motion Neurological:  normal muscle tone. Coordination normal. No atrophy  full exam not performed Skin: Skin warm and dry Psychiatric: normal affect, pleasant   Recent Labs: 06/19/2022: ALT 24 06/21/2022: Hemoglobin 13.3; Platelets 270 06/22/2022: Magnesium 1.9 06/23/2022: BUN 18; Creatinine, Ser 0.78; Potassium 3.7; Sodium 140    Lipid Panel Lab Results  Component Value Date   CHOL 174 06/20/2022   HDL 58 06/20/2022   LDLCALC 75 06/20/2022   TRIG 206 (H) 06/20/2022    Wt Readings from Last 3 Encounters:  08/31/22 123 lb (55.8 kg)  06/21/22 145 lb 15.1 oz (66.2 kg)  06/19/22 141 lb (64 kg)      ASSESSMENT AND PLAN:  CAD in native artery - Plan: EKG 12-Lead Currently with no symptoms of angina. No further workup at this time. Continue current medication regimen. Cholesterol  above goal, tolerating Crestor 40 daily, significant weight loss in the past year  Essential hypertension - Plan: EKG 12-Lead Blood pressure low on today's visit Numerous strokes in the past concerning for hypertension No changes to the medications For continued hypotension with bradycardia, potentially could decrease metoprolol succinate down to 25 daily  Pure hypercholesterolemia Previously on Lipitor Zetia Continue Crestor 40 daily  Bilateral carotid artery disease (HCC) Prior smoking history quit 2014 No bruits on exam Bilateral disease less than 50%, on aspirin, statin  Numerous strokes History of intracranial hemorrhage, concern for hypertensive etiology Plan at aspirin, Plavix held August 2023 Concern for recent event with nausea vomiting 2 days ago, left facial numbness Declining evaluation in the emergency room today Reports symptoms slowly improving over the past 48 hours  History of  coronary artery stent placement On aspirin Not on Plavix given intracranial hemorrhage    Total encounter time more than 40 minutes  Greater than 50% was spent in counseling and coordination of care with the patient   Orders Placed This Encounter  Procedures   EKG 12-Lead     Signed, Esmond Plants, M.D., Ph.D. 08/31/2022  [place], [place]

## 2022-08-31 ENCOUNTER — Emergency Department: Payer: No Typology Code available for payment source

## 2022-08-31 ENCOUNTER — Emergency Department
Admission: EM | Admit: 2022-08-31 | Discharge: 2022-09-01 | Disposition: A | Discharge status: Home / Self Care | Payer: No Typology Code available for payment source | Attending: Emergency Medicine | Admitting: Emergency Medicine

## 2022-08-31 ENCOUNTER — Telehealth: Payer: Self-pay

## 2022-08-31 ENCOUNTER — Encounter: Payer: Self-pay | Admitting: Cardiovascular Disease

## 2022-08-31 ENCOUNTER — Ambulatory Visit
Payer: No Typology Code available for payment source | Attending: Cardiovascular Disease | Admitting: Cardiovascular Disease

## 2022-08-31 VITALS — BP 100/60 | HR 49 | Ht 62.0 in | Wt 123.0 lb

## 2022-08-31 DIAGNOSIS — H81399 Other peripheral vertigo, unspecified ear: Secondary | ICD-10-CM | POA: Diagnosis not present

## 2022-08-31 DIAGNOSIS — I959 Hypotension, unspecified: Secondary | ICD-10-CM | POA: Diagnosis not present

## 2022-08-31 DIAGNOSIS — I25118 Atherosclerotic heart disease of native coronary artery with other forms of angina pectoris: Secondary | ICD-10-CM | POA: Diagnosis not present

## 2022-08-31 DIAGNOSIS — E785 Hyperlipidemia, unspecified: Secondary | ICD-10-CM | POA: Diagnosis not present

## 2022-08-31 DIAGNOSIS — R0902 Hypoxemia: Secondary | ICD-10-CM | POA: Diagnosis not present

## 2022-08-31 DIAGNOSIS — R200 Anesthesia of skin: Secondary | ICD-10-CM | POA: Insufficient documentation

## 2022-08-31 DIAGNOSIS — I129 Hypertensive chronic kidney disease with stage 1 through stage 4 chronic kidney disease, or unspecified chronic kidney disease: Secondary | ICD-10-CM | POA: Diagnosis not present

## 2022-08-31 DIAGNOSIS — I5022 Chronic systolic (congestive) heart failure: Secondary | ICD-10-CM

## 2022-08-31 DIAGNOSIS — N189 Chronic kidney disease, unspecified: Secondary | ICD-10-CM | POA: Diagnosis not present

## 2022-08-31 DIAGNOSIS — I6523 Occlusion and stenosis of bilateral carotid arteries: Secondary | ICD-10-CM

## 2022-08-31 DIAGNOSIS — I639 Cerebral infarction, unspecified: Secondary | ICD-10-CM | POA: Diagnosis not present

## 2022-08-31 DIAGNOSIS — I10 Essential (primary) hypertension: Secondary | ICD-10-CM | POA: Diagnosis not present

## 2022-08-31 DIAGNOSIS — I2510 Atherosclerotic heart disease of native coronary artery without angina pectoris: Secondary | ICD-10-CM | POA: Diagnosis not present

## 2022-08-31 DIAGNOSIS — R001 Bradycardia, unspecified: Secondary | ICD-10-CM | POA: Diagnosis not present

## 2022-08-31 DIAGNOSIS — I6381 Other cerebral infarction due to occlusion or stenosis of small artery: Secondary | ICD-10-CM | POA: Diagnosis not present

## 2022-08-31 DIAGNOSIS — R42 Dizziness and giddiness: Secondary | ICD-10-CM | POA: Diagnosis not present

## 2022-08-31 DIAGNOSIS — R202 Paresthesia of skin: Secondary | ICD-10-CM | POA: Diagnosis not present

## 2022-08-31 LAB — URINALYSIS, ROUTINE W REFLEX MICROSCOPIC
Bacteria, UA: NONE SEEN
Bilirubin Urine: NEGATIVE
Glucose, UA: NEGATIVE mg/dL
Hgb urine dipstick: NEGATIVE
Ketones, ur: NEGATIVE mg/dL
Leukocytes,Ua: NEGATIVE
Nitrite: NEGATIVE
Protein, ur: 30 mg/dL — AB
Specific Gravity, Urine: 1.031 — ABNORMAL HIGH (ref 1.005–1.030)
pH: 5 (ref 5.0–8.0)

## 2022-08-31 LAB — BASIC METABOLIC PANEL
Anion gap: 9 (ref 5–15)
BUN: 24 mg/dL — ABNORMAL HIGH (ref 8–23)
CO2: 24 mmol/L (ref 22–32)
Calcium: 9.8 mg/dL (ref 8.9–10.3)
Chloride: 106 mmol/L (ref 98–111)
Creatinine, Ser: 0.92 mg/dL (ref 0.44–1.00)
GFR, Estimated: >60 mL/min (ref >60)
Glucose, Bld: 118 mg/dL — ABNORMAL HIGH (ref 70–99)
Potassium: 3.6 mmol/L (ref 3.5–5.1)
Sodium: 139 mmol/L (ref 135–145)

## 2022-08-31 LAB — CBC
HCT: 38.6 % (ref 36.0–46.0)
Hemoglobin: 13 g/dL (ref 12.0–15.0)
MCH: 31.8 pg (ref 26.0–34.0)
MCHC: 33.7 g/dL (ref 30.0–36.0)
MCV: 94.4 fL (ref 80.0–100.0)
Platelets: 307 10*3/uL (ref 150–400)
RBC: 4.09 MIL/uL (ref 3.87–5.11)
RDW: 13 % (ref 11.5–15.5)
WBC: 9.1 10*3/uL (ref 4.0–10.5)
nRBC: 0 % (ref 0.0–0.2)

## 2022-08-31 LAB — TROPONIN I (HIGH SENSITIVITY): Troponin I (High Sensitivity): 5 ng/L (ref <18)

## 2022-08-31 MED ORDER — MECLIZINE HCL 25 MG PO TABS
25.0000 mg | ORAL_TABLET | Freq: Once | ORAL | Status: AC
  Administered 2022-08-31: 25 mg via ORAL
  Filled 2022-08-31: qty 1

## 2022-08-31 MED ORDER — MECLIZINE HCL 25 MG PO TABS: 25.0000 mg | ORAL_TABLET | Freq: Three times a day (TID) | ORAL | 0 refills | Status: AC | PRN

## 2022-08-31 MED ORDER — SODIUM CHLORIDE 0.9 % IV BOLUS
500.0000 mL | Freq: Once | INTRAVENOUS | Status: AC
  Administered 2022-08-31: 500 mL via INTRAVENOUS

## 2022-08-31 NOTE — ED Triage Notes (Signed)
See RN note on 08/31/2022 at 1850.

## 2022-08-31 NOTE — ED Triage Notes (Signed)
Pt from [facility] via ems with reports of dizziness since Saturday with episodes of vomiting Saturday and Today.  Pt in rehab for CVA that she had in Aug.  HR 65 112/60 93RA CBG "normal"

## 2022-08-31 NOTE — Telephone Encounter (Signed)
Pt last transmission was noted on 8/17. Pt currently at [facility] facility and returning home Sunday 10/29. Per Dr. Rockey Situ, can we schedule a download for Monday 10/30.  Will forward to device clinic

## 2022-08-31 NOTE — Telephone Encounter (Signed)
Scheduled next remote for 09/07/22 per request.  Advised last remote transmission received was 08/13/2022.

## 2022-08-31 NOTE — Patient Instructions (Addendum)
For nausea/vomiting, worsening facial symptoms, Please call 911, be evaluated in the ER  Medication Instructions:  No changes  If you need a refill on your cardiac medications before your next appointment, please call your pharmacy.   Lab work: No new labs needed  Testing/Procedures: No new testing needed  Follow-Up: At [facility], you and your health needs are our priority.  As part of our continuing mission to provide you with exceptional heart care, we have created designated Provider Care Teams.  These Care Teams include your primary Cardiologist (physician) and Advanced Practice Providers (APPs -  Physician Assistants and Nurse Practitioners) who all work together to provide you with the care you need, when you need it.  You will need a follow up appointment in 6 months, APP ok  Providers on your designated Care Team:   Murray Hodgkins, NP Christell Faith, PA-C Cadence Kathlen Mody, [place]  COVID-19 Vaccine Information can be found at: [email or web address] For questions related to vaccine distribution or appointments, please email [email or web address] or call [phone].

## 2022-08-31 NOTE — ED Provider Notes (Signed)
[facility] Provider Note    Event Date/Time   First MD Initiated Contact with Patient 08/31/22 2106     (approximate)   History   Dizziness   HPI  [name] is a 66 y.o. female with history of hypertension, multiple prior CVAs with residual aphasia, migraines, CAD status post MI, CKD, hyperlipidemia who presents with dizziness.  The patient and her sister report that 2 days ago she started feeling dizzy and reported a funny feeling on the left side of her head where she had her intracranial hemorrhage in August.  The dizziness feels like everything is spinning and is worse if she moves or turns her head then improves when she is still.  She reports associated nausea.  Initially when this started 2 days ago she had a couple of episodes of vomiting but that has resolved.  She denies any headache, vision changes, or any weakness or numbness.  She denies any ear pain, hearing loss, or ringing.  She has no chest pain or difficulty breathing.  She denies any diarrhea or abdominal pain.  I reviewed the past medical records.  The patient was most recently admitted in August.  Per the hospitalist discharge summary from 06/26/2022 she presented with aphasia and was found to have an acute left thalamic hemorrhage thought to be hypertensive in etiology.  I also reviewed a note by Dr. Rockey Situ from this morning.  The patient at that time reported left facial numbness associated with the nausea and vomiting.   Physical Exam   Triage Vital Signs: ED Triage Vitals  Enc Vitals Group     BP 08/31/22 1903 100/62     Pulse Rate 08/31/22 1903 (!) 56     Resp 08/31/22 1903 18     Temp 08/31/22 1903 98.6 F (37 C)     Temp Source 08/31/22 1903 Oral     SpO2 08/31/22 1903 96 %     Weight 08/31/22 1859 123 lb (55.8 kg)     Height --      Head Circumference --      Peak Flow --      Pain Score --      Pain Loc --      Pain Edu? --      Excl. in [place]? --     Most recent vital  signs: Vitals:   08/31/22 2222 08/31/22 2227  BP: (!) 109/56   Pulse: (!) 44   Resp: 16   Temp:    SpO2: 97% 97%     General: Awake, no distress.  CV:  Good peripheral perfusion.  Resp:  Normal effort.  Abd:  Soft and nontender.  No distention.  Other:  EOMI.  PERRLA.  Mild nystagmus with gaze to the left.  No facial droop.  Dysarthric speech, baseline for patient.  Motor intact in all extremities.  Normal coordination.  No ataxia.  No peripheral edema.   ED Results / Procedures / Treatments   Labs (all labs ordered are listed, but only abnormal results are displayed) Labs Reviewed  BASIC METABOLIC PANEL - Abnormal; Notable for the following components:      Result Value   Glucose, Bld 118 (*)    BUN 24 (*)    All other components within normal limits  CBC  URINALYSIS, ROUTINE W REFLEX MICROSCOPIC  CBG MONITORING, ED  TROPONIN I (HIGH SENSITIVITY)     EKG  ED ECG REPORT I, Arta Silence, the attending physician, personally  viewed and interpreted this ECG.  Date: 08/31/2022 EKG Time: 1859 Rate: 55 Rhythm: normal sinus rhythm QRS Axis: normal Intervals: normal ST/T Wave abnormalities: Nonspecific T wave abnormalities Narrative Interpretation: no evidence of acute ischemia; no significant change when compared to EKG of 06/19/2022    RADIOLOGY  CT head: I independently viewed and interpreted the images; there is no ICH.  Radiology report indicates no acute abnormalities.  PROCEDURES:  Critical Care performed: No  Procedures   MEDICATIONS ORDERED IN ED: Medications  sodium chloride 0.9 % bolus 500 mL (500 mLs Intravenous New Bag/Given 08/31/22 2203)  meclizine (ANTIVERT) tablet 25 mg (25 mg Oral Given 08/31/22 2205)     IMPRESSION / MDM / ASSESSMENT AND PLAN / ED COURSE  I reviewed the triage vital signs and the nursing notes.  66 year old female with PMH as noted above presents with dizziness, a "funny feeling" in the left side of her head or  face, nausea and vomiting 2 days ago which is now subsiding although she still has some dizziness with movement.  Neurologic exam is nonfocal for acute findings and her vital signs are normal.  Differential diagnosis includes, but is not limited to, peripheral vertigo, electrolyte abnormality other metabolic disturbance, hypoglycemia, gastroenteritis or other viral syndrome, or less likely new or recurrent intracranial hemorrhage, acute CVA, complex migraine.  We will obtain a CT head, lab work-up, give fluids and meclizine and reassess.  Patient's presentation is most consistent with acute presentation with potential threat to life or bodily function.  The patient is on the cardiac monitor to evaluate for evidence of arrhythmia and/or significant heart rate changes.   ----------------------------------------- 11:27 PM on 08/31/2022 -----------------------------------------  Lab work-up is unremarkable.  Troponin is negative.  There is no indication for repeat given the duration of the symptoms.  Electrolytes are normal.  There is no leukocytosis.  Urinalysis is pending.  CT head shows no acute abnormality.  The patient reports improvement in her symptoms with meclizine.  Given the lack of any acute neurologic deficits on exam there is no indication for MRI or other advanced imaging.  If the urinalysis is negative I anticipate discharge home with meclizine for symptomatic treatment of peripheral vertigo.  I have signed the patient out to the oncoming ED physician Dr. Beather Arbour.  FINAL CLINICAL IMPRESSION(S) / ED DIAGNOSES   Final diagnoses:  Peripheral vertigo, unspecified laterality     Rx / DC Orders   ED Discharge Orders          Ordered    meclizine (ANTIVERT) 25 MG tablet  3 times daily PRN        08/31/22 2325             Note:  This document was prepared using Dragon voice recognition software and may include unintentional dictation errors.    Arta Silence,  MD 08/31/22 [phone]

## 2022-08-31 NOTE — Discharge Instructions (Addendum)
Take the meclizine as needed for dizziness.  Return to the ER for new, worsening, or persistent severe dizziness, headache, vision changes, weakness or numbness, confusion or change in mental status, or any other new or worsening symptoms that concern you.

## 2022-09-01 DIAGNOSIS — Z743 Need for continuous supervision: Secondary | ICD-10-CM | POA: Diagnosis not present

## 2022-09-01 DIAGNOSIS — R42 Dizziness and giddiness: Secondary | ICD-10-CM | POA: Diagnosis not present

## 2022-09-01 DIAGNOSIS — R279 Unspecified lack of coordination: Secondary | ICD-10-CM | POA: Diagnosis not present

## 2022-09-01 NOTE — ED Provider Notes (Signed)
-----------------------------------------   12:01 AM on 09/01/2022 -----------------------------------------   Updated patient and family member on negative urinalysis.  Patient is overall feeling better.  Will discharge back to facility with prescription for meclizine already sent to her pharmacy.  Strict return precautions given.  Both verbalized understanding agree with plan of care.   Paulette Blanch, MD 09/01/22 [phone]

## 2022-09-01 NOTE — ED Notes (Signed)
Called [facility] for transport back to [facility]

## 2022-09-14 ENCOUNTER — Ambulatory Visit (INDEPENDENT_AMBULATORY_CARE_PROVIDER_SITE_OTHER): Payer: Medicare Other

## 2022-09-14 DIAGNOSIS — I639 Cerebral infarction, unspecified: Secondary | ICD-10-CM

## 2022-09-15 LAB — CUP PACEART REMOTE DEVICE CHECK
Date Time Interrogation Session: 20231103230445
Implantable Pulse Generator Implant Date: 20221019

## 2022-09-21 ENCOUNTER — Telehealth: Payer: Self-pay

## 2022-09-21 NOTE — Telephone Encounter (Signed)
Located EGM in [place]. Ectopy present.

## 2022-09-21 NOTE — Telephone Encounter (Signed)
-----   Message from Vickie Epley, MD sent at 09/21/2022  9:27 AM EST ----- Hey, there is an AF event on the recent ILR. There is a mention of this being routed to triage but I don't see it in [place]. Is there an EGM for this event? Thx, CL

## 2022-10-13 NOTE — Progress Notes (Unsigned)
Electrophysiology Office Follow up Visit Note:    Date:  10/14/2022   ID:  [name], DOB [birth year], MRN [id number]  PCP:  Baxter Hire, MD  [facility] Cardiologist:  Ida Rogue, MD  [facility] Electrophysiologist:  Vickie Epley, MD    Interval History:    [name] is a 66 y.o. female who presents for a follow up visit. They were last seen in clinic 08/27/2021 for evaluation of cryptogenic stroke. A loop recorder was implanted at that visit.   She has a history of multiple strokes thought to be secondary to HTN. Also hx of intracranial hemorrhage 07/01/2022.  A 2 min episode of AF was detected 08/14/2022. Several immediate attempts to contact the patient were unsuccessful. Eventually, the device clinic reached a contact for the patient and an appointment was scheduled with Dr Rockey Situ 08/31/2022.   She is with a friend today in clinic.  She tells me that her recovery is going well overall.  She is not taking an antiplatelet or anticoagulant at this time.     Past Medical History:  Diagnosis Date   Anxiety    Arthritis    CAD (coronary artery disease)    a. 03/2013 NSTEMI/Cath: 100 RCA, EF 45%->Med Rx;  b. 12/2013 Cath/PCI: LAD 38m 30d, D1 80, D2 60, LCX nl, RCA 50ost/p, 615m95d (2.5x33 Xience DES);  c. 05/2014 Lexi MV: EF 68%, no ischemia; d. stress echo 12/2015 no ischemia @ max exercise (did not achieve target)   Carotid disease, bilateral ([facility]   a. 08/2013 Carotid U/S: 1-39% bilat ICA stenosis.   Chronic kidney disease    Chronic kidney infections. Takes daily preventative.   Complication of anesthesia    CVA (cerebral vascular accident) ([facility]   a. 08/2013.   CVA (cerebral vascular accident) ([facility]   Decreased libido    Depression    GERD (gastroesophageal reflux disease)    History of 2019 novel coronavirus disease (COVID-19) 07/2019   History of recurrent UTIs    Hypercholesteremia    Hyperlipemia    Hypertension    Insomnia    Ischemic  cardiomyopathy    a. 03/2013 Echo: EF 30-35%, mod dil LA, mod-sev MR, mod TR;  b. 08/2013 Echo EF 60-65%, mild AI.   Menopause    Migraines    Myocardial infarction (HCC)    PONV (postoperative nausea and vomiting)    If anesthsia is given slowly, pt will not throw up, if given quickly, pt will have nausea and vomiting.   Right lower quadrant pain    Syncope and collapse    Tobacco abuse    Vaginal atrophy     Past Surgical History:  Procedure Laterality Date   CARDIAC CATHETERIZATION  01/01/2014   CARDIAC CATHETERIZATION  03/16/2013   CARDIAC CATHETERIZATION  12/2013   CARDIAC CATHETERIZATION N/A 08/19/2016   Procedure: Left Heart Cath and Coronary Angiography;  Surgeon: TiMinna MerrittsMD;  [facility];  Service: Cardiovascular;  Laterality: N/A;   CESAREAN SECTION WITH BILATERAL TUBAL LIGATION     CORONARY ANGIOPLASTY WITH STENT PLACEMENT  01/01/2014   95% lesion with a drug eluting stent to the distal RCA.   ENDOMETRIAL ABLATION     KNEE ARTHROSCOPY  11/27/2006   left knee    OOPHORECTOMY     TEE WITHOUT CARDIOVERSION N/A 07/25/2021   Procedure: TRANSESOPHAGEAL ECHOCARDIOGRAM (TEE);  Surgeon: GoMinna MerrittsMD;  Location: [facility];  Service: Cardiovascular;  Laterality: N/A;   TOTAL KNEE ARTHROPLASTY Left 10/06/2016   Procedure: TOTAL KNEE ARTHROPLASTY;  Surgeon: Corky Mull, MD;  Location: [facility];  Service: Orthopedics;  Laterality: Left;   TOTAL KNEE ARTHROPLASTY Right 11/12/2020   Procedure: TOTAL KNEE ARTHROPLASTY;  Surgeon: Corky Mull, MD;  Location: [facility];  Service: Orthopedics;  Laterality: Right;   TUBAL LIGATION      Current Medications: Current Meds  Medication Sig   FLUoxetine (PROZAC) 20 MG capsule Take 20 mg by mouth daily.   furosemide (LASIX) 20 MG tablet TAKE 1 TABLET(20 MG) BY MOUTH DAILY   metoprolol succinate (TOPROL-XL) 50 MG 24 hr tablet TAKE 1 TABLET(50 MG) BY MOUTH DAILY WITH OR IMMEDIATELY FOLLOWING A MEAL    nitroGLYCERIN (NITROSTAT) 0.4 MG SL tablet Place 1 tablet (0.4 mg total) under the tongue every 5 (five) minutes as needed for chest pain (If you experience chest pain: Place 1 nitroglycerin under your tongue, while sitting. If no relief of pain, may repeat 1 tablet every 5 minutes up to 3 tablets over 15 minutes. If no relief call 911. If you have dizziness/lightheadedness while taking nitroglycerin, stop taking and call 911.). (Patient taking differently: Place 0.4 mg under the tongue every 5 (five) minutes as needed for chest pain.)   pantoprazole (PROTONIX) 40 MG tablet Take 40 mg by mouth 2 (two) times daily.     Allergies:   Codeine sulfate and Penicillins   Social History   Socioeconomic History   Marital status: Single    Spouse name: Not on file   Number of children: 1   Years of education: Not on file   Highest education level: Not on file  Occupational History   Occupation: lab tech    Employer: [employer]  Tobacco Use   Smoking status: Former    Packs/day: 1.50    Years: 20.00    Total pack years: 30.00    Types: Cigarettes    Quit date: 04/07/2013    Years since quitting: 9.5   Smokeless tobacco: Never  Vaping Use   Vaping Use: Never used  Substance and Sexual Activity   Alcohol use: Not Currently    Comment: 1 glass of wine a month   Drug use: No   Sexual activity: Not Currently    Birth control/protection: None  Other Topics Concern   Not on file  Social History Narrative   Lives alone, not compliant w/ med regimen   Social Determinants of Health   Financial Resource Strain: Not on file  Food Insecurity: Not on file  Transportation Needs: Not on file  Physical Activity: Inactive (02/22/2018)   Exercise Vital Sign    Days of Exercise per Week: 0 days    Minutes of Exercise per Session: 0 min  Stress: Not on file  Social Connections: Not on file     Family History: The patient's family history includes Breast cancer in her paternal  grandmother; Hyperlipidemia in her mother; Hypertension in her mother, sister, and sister; Stroke in her mother. There is no history of Colon cancer, Ovarian cancer, Heart disease, or Diabetes.  ROS:   Please see the history of present illness.    All other systems reviewed and are negative.  EKGs/Labs/Other Studies Reviewed:    The following studies were reviewed today:  ILR interrogations reviewed. 1 episode of atrial fibrillation lasting 2 minutes.  Recent Labs: 06/19/2022: ALT 24 06/22/2022: Magnesium 1.9 08/31/2022: BUN 24; Creatinine, Ser 0.92; Hemoglobin 13.0; Platelets 307;  Potassium 3.6; Sodium 139  Recent Lipid Panel    Component Value Date/Time   CHOL 174 06/20/2022 0937   CHOL 174 09/29/2021 1150   CHOL 192 05/15/2014 0500   TRIG 206 (H) 06/20/2022 0937   TRIG 155 05/15/2014 0500   HDL 58 06/20/2022 0937   HDL 62 09/29/2021 1150   HDL 51 05/15/2014 0500   CHOLHDL 3.0 06/20/2022 0937   VLDL 41 (H) 06/20/2022 0937   VLDL 31 05/15/2014 0500   LDLCALC 75 06/20/2022 0937   LDLCALC 90 09/29/2021 1150   LDLCALC 110 (H) 05/15/2014 0500   LDLDIRECT 94 09/29/2021 1150    Physical Exam:    VS:  BP 136/84   Pulse 80   Ht '5\' 2"'$  (1.575 m)   Wt 122 lb (55.3 kg)   SpO2 97%   BMI 22.31 kg/m     Wt Readings from Last 3 Encounters:  10/14/22 122 lb (55.3 kg)  08/31/22 123 lb (55.8 kg)  08/31/22 123 lb (55.8 kg)     GEN:  Well nourished, well developed in no acute distress HEENT: Normal NECK: No JVD; No carotid bruits LYMPHATICS: No lymphadenopathy CARDIAC: RRR, no murmurs, rubs, gallops RESPIRATORY:  Clear to auscultation without rales, wheezing or rhonchi  ABDOMEN: Soft, non-tender, non-distended MUSCULOSKELETAL:  No edema; No deformity  SKIN: Warm and dry NEUROLOGIC:  Alert and oriented x 3 PSYCHIATRIC:  Normal affect        ASSESSMENT:    1. Cerebrovascular accident (CVA), unspecified mechanism ([place])   2. Primary hypertension    PLAN:    In order  of problems listed above:   #Stroke hx #Hx of intracranial hemorrhage. History of poorly controlled HTN seems to be the primary cause of her neurologic events. She did have 1 episode of AF on her loop that lasted 2 minutes. Given her history of intracranial hemorrhage, I believe the risks of anticoagulation outweigh any potential benefit. We will continue to monitor the burden of her AF using her ILR to guide any future decision to anticoagulate.  #Hypertension At goal today.  Recommend checking blood pressures 1-2 times per week at home and recording the values.  Recommend bringing these recordings to the primary care physician.    Follow-up 3 months.  At that appointment we will discuss anticoagulation versus left atrial appendage occlusion.   Medication Adjustments/Labs and Tests Ordered: Current medicines are reviewed at length with the patient today.  Concerns regarding medicines are outlined above.  No orders of the defined types were placed in this encounter.  No orders of the defined types were placed in this encounter.    Signed, Lars Mage, MD, [facility], [facility] 10/14/2022 10:25 AM    Electrophysiology [facility]

## 2022-10-14 ENCOUNTER — Ambulatory Visit: Payer: Medicare Other | Attending: Cardiology | Admitting: Cardiology

## 2022-10-14 VITALS — BP 136/84 | HR 80 | Ht 62.0 in | Wt 122.0 lb

## 2022-10-14 DIAGNOSIS — I639 Cerebral infarction, unspecified: Secondary | ICD-10-CM | POA: Insufficient documentation

## 2022-10-14 DIAGNOSIS — I10 Essential (primary) hypertension: Secondary | ICD-10-CM | POA: Insufficient documentation

## 2022-10-14 NOTE — Patient Instructions (Signed)
Medication Instructions:  Your physician recommends that you continue on your current medications as directed. Please refer to the Current Medication list given to you today.  *If you need a refill on your cardiac medications before your next appointment, please call your pharmacy*   Lab Work: None ordered   Testing/Procedures: None ordered   Follow-Up: At [facility], you and your health needs are our priority.  As part of our continuing mission to provide you with exceptional heart care, we have created designated Provider Care Teams.  These Care Teams include your primary Cardiologist (physician) and Advanced Practice Providers (APPs -  Physician Assistants and Nurse Practitioners) who all work together to provide you with the care you need, when you need it.  We recommend signing up for the patient portal called "MyChart".  Sign up information is provided on this After Visit Summary.  MyChart is used to connect with patients for Virtual Visits (Telemedicine).  Patients are able to view lab/test results, encounter notes, upcoming appointments, etc.  Non-urgent messages can be sent to your provider as well.   To learn more about what you can do with MyChart, go to [email or web address].    Your next appointment:   3 month(s)  The format for your next appointment:   In Person  Provider:   Lars Mage, MD    Thank you for choosing [facility]!!   [phone]  Other Instructions   Important Information About Sugar

## 2022-10-15 NOTE — Progress Notes (Signed)
Carelink Summary Report / Loop Recorder 

## 2022-10-19 ENCOUNTER — Ambulatory Visit (INDEPENDENT_AMBULATORY_CARE_PROVIDER_SITE_OTHER): Payer: Medicare Other

## 2022-10-19 DIAGNOSIS — I639 Cerebral infarction, unspecified: Secondary | ICD-10-CM | POA: Diagnosis not present

## 2022-10-19 LAB — CUP PACEART REMOTE DEVICE CHECK
Date Time Interrogation Session: 20231210231057
Implantable Pulse Generator Implant Date: 20221019

## 2022-11-23 ENCOUNTER — Ambulatory Visit (INDEPENDENT_AMBULATORY_CARE_PROVIDER_SITE_OTHER): Payer: Medicare Other

## 2022-11-23 DIAGNOSIS — I639 Cerebral infarction, unspecified: Secondary | ICD-10-CM

## 2022-11-24 LAB — CUP PACEART REMOTE DEVICE CHECK
Date Time Interrogation Session: 20240112230737
Implantable Pulse Generator Implant Date: 20221019

## 2022-11-26 NOTE — Progress Notes (Signed)
Carelink Summary Report / Loop Recorder

## 2022-12-27 LAB — CUP PACEART REMOTE DEVICE CHECK
Date Time Interrogation Session: 20240214231729
Implantable Pulse Generator Implant Date: 20221019

## 2022-12-28 ENCOUNTER — Ambulatory Visit (INDEPENDENT_AMBULATORY_CARE_PROVIDER_SITE_OTHER): Payer: Medicare Other

## 2022-12-28 DIAGNOSIS — I639 Cerebral infarction, unspecified: Secondary | ICD-10-CM

## 2023-01-05 NOTE — Progress Notes (Signed)
Carelink Summary Report / Loop Recorder 

## 2023-01-06 ENCOUNTER — Other Ambulatory Visit: Payer: Self-pay | Admitting: Physician Assistant

## 2023-01-12 NOTE — Progress Notes (Unsigned)
  Electrophysiology Office Follow up Visit Note:    Date:  01/13/2023   ID:  [name], DOB [birth year], MRN [id number]  PCP:  Baxter Hire, MD  [facility] Cardiologist:  Ida Rogue, MD  [facility] Electrophysiologist:  Vickie Epley, MD    Interval History:    [name] is a 67 y.o. female who presents for a follow up visit.   I saw the patient 10/14/2022 for hx of CVA. ILR monitoring has shown a 2 minute episode of AF. None recently. No prolonged episodes. Hx of ICH has limited anticoagulation safety/use. She does take aspirin daily.  She has been doing well.  She is with family.  No abnormalities of her heart rhythm that she has been aware of.     Past medical, surgical, social and family history were reviewed.  ROS:   Please see the history of present illness.    All other systems reviewed and are negative.  EKGs/Labs/Other Studies Reviewed:    The following studies were reviewed today:  Loop recorder interrogations have been reviewed.  No atrial fibrillation episodes detected.  0.1% PVC burden     Physical Exam:    VS:  BP 108/76   Pulse 75   Ht '5\' 2"'$  (1.575 m)   Wt 120 lb 12.8 oz (54.8 kg)   SpO2 98%   BMI 22.09 kg/m     Wt Readings from Last 3 Encounters:  01/13/23 120 lb 12.8 oz (54.8 kg)  10/14/22 122 lb (55.3 kg)  08/31/22 123 lb (55.8 kg)     GEN:  Well nourished, well developed in no acute distress CARDIAC: RRR, no murmurs, rubs, gallops RESPIRATORY:  Clear to auscultation without rales, wheezing or rhonchi       ASSESSMENT:    1. Cerebrovascular accident (CVA), unspecified mechanism ([place])   2. Primary hypertension    PLAN:    In order of problems listed above:  #Hx of CVA #hx of ICH Very low burden of AF on ILR monitoring. On aspirin 81 daily. Not on [place] given hx of ICH. Recommend continuing monitoring with ILR for now. If her burden of AF were to significantly increase, could discuss  LAAO.    #Hypertension At goal today.  Recommend checking blood pressures 1-2 times per week at home and recording the values.  Recommend bringing these recordings to the primary care physician.   Follow-up with EP on an as-needed basis.  Signed, Lars Mage, MD, [facility], [facility] 01/13/2023 10:22 AM    Electrophysiology [facility]

## 2023-01-13 ENCOUNTER — Encounter: Payer: Self-pay | Admitting: Cardiology

## 2023-01-13 ENCOUNTER — Ambulatory Visit: Payer: Medicare Other | Attending: Cardiology | Admitting: Cardiology

## 2023-01-13 ENCOUNTER — Telehealth: Payer: Self-pay | Admitting: Cardiovascular Disease

## 2023-01-13 VITALS — BP 108/76 | HR 75 | Ht 62.0 in | Wt 120.8 lb

## 2023-01-13 DIAGNOSIS — I639 Cerebral infarction, unspecified: Secondary | ICD-10-CM | POA: Diagnosis not present

## 2023-01-13 DIAGNOSIS — I10 Essential (primary) hypertension: Secondary | ICD-10-CM

## 2023-01-13 NOTE — Patient Instructions (Signed)
Medication Instructions:  Your physician recommends that you continue on your current medications as directed. Please refer to the Current Medication list given to you today.  *If you need a refill on your cardiac medications before your next appointment, please call your pharmacy*  Follow-Up: At [facility], you and your health needs are our priority.  As part of our continuing mission to provide you with exceptional heart care, we have created designated Provider Care Teams.  These Care Teams include your primary Cardiologist (physician) and Advanced Practice Providers (APPs -  Physician Assistants and Nurse Practitioners) who all work together to provide you with the care you need, when you need it.   Your next appointment:   As needed with Dr. Quentin Ore

## 2023-01-13 NOTE — Telephone Encounter (Signed)
Spoke with the patient and confirmed that I could speak with Juanda Crumble. He states that the patient found an empty bottle of Plavix and thought that she was supposed to be taking it so requested a refill however it was denied. They would like to know if it needs to be filled. I advised that this was discontinued after her hospitalization last August and she should no longer be taking it. They verbalized understanding.

## 2023-01-13 NOTE — Telephone Encounter (Signed)
Pt c/o medication issue:  1. Name of Medication: clopidogrel (PLAVIX) 75 MG tablet   2. How are you currently taking this medication (dosage and times per day)? TAKE 1 TABLET BY MOUTH DAILY   3. Are you having a reaction (difficulty breathing--STAT)? No 4. What is your medication issue? Pt's boyfriend called stating they tried to get the medication refilled but the pharmacy told them it was denied by the provider. It looks like the medication was taken off the pt's medication list and they wanted to know why. The pt's boyfriend stated that the pt ran out about a week ago and needed it refilled for the pt. Please advise

## 2023-01-30 LAB — CUP PACEART REMOTE DEVICE CHECK
Date Time Interrogation Session: 20240318230626
Implantable Pulse Generator Implant Date: 20221019

## 2023-02-01 ENCOUNTER — Ambulatory Visit (INDEPENDENT_AMBULATORY_CARE_PROVIDER_SITE_OTHER): Payer: Medicare Other

## 2023-02-01 DIAGNOSIS — I639 Cerebral infarction, unspecified: Secondary | ICD-10-CM

## 2023-02-10 NOTE — Progress Notes (Signed)
Carelink Summary Report / Loop Recorder 

## 2023-03-08 ENCOUNTER — Ambulatory Visit (INDEPENDENT_AMBULATORY_CARE_PROVIDER_SITE_OTHER): Payer: Medicare Other

## 2023-03-08 DIAGNOSIS — I639 Cerebral infarction, unspecified: Secondary | ICD-10-CM | POA: Diagnosis not present

## 2023-03-08 LAB — CUP PACEART REMOTE DEVICE CHECK
Date Time Interrogation Session: 20240428230627
Implantable Pulse Generator Implant Date: 20221019

## 2023-03-12 NOTE — Progress Notes (Signed)
Carelink Summary Report / Loop Recorder 

## 2023-04-08 NOTE — Progress Notes (Signed)
Carelink Summary Report / Loop Recorder 

## 2023-04-12 ENCOUNTER — Ambulatory Visit (INDEPENDENT_AMBULATORY_CARE_PROVIDER_SITE_OTHER): Payer: Medicare Other

## 2023-04-12 DIAGNOSIS — I639 Cerebral infarction, unspecified: Secondary | ICD-10-CM | POA: Diagnosis not present

## 2023-04-12 LAB — CUP PACEART REMOTE DEVICE CHECK
Date Time Interrogation Session: 20240602230413
Implantable Pulse Generator Implant Date: 20221019

## 2023-05-04 NOTE — Progress Notes (Signed)
Carelink Summary Report / Loop Recorder 

## 2023-05-17 ENCOUNTER — Ambulatory Visit (INDEPENDENT_AMBULATORY_CARE_PROVIDER_SITE_OTHER): Payer: Medicare Other

## 2023-05-17 DIAGNOSIS — I639 Cerebral infarction, unspecified: Secondary | ICD-10-CM | POA: Diagnosis not present

## 2023-05-17 LAB — CUP PACEART REMOTE DEVICE CHECK
Date Time Interrogation Session: 20240705230035
Implantable Pulse Generator Implant Date: 20221019

## 2023-06-02 NOTE — Progress Notes (Signed)
Carelink Summary Report / Loop Recorder 

## 2023-06-21 ENCOUNTER — Ambulatory Visit: Payer: No Typology Code available for payment source

## 2023-06-21 DIAGNOSIS — I639 Cerebral infarction, unspecified: Secondary | ICD-10-CM | POA: Diagnosis not present

## 2023-06-24 ENCOUNTER — Other Ambulatory Visit: Payer: Self-pay | Admitting: Internal Medicine

## 2023-06-24 DIAGNOSIS — Z1231 Encounter for screening mammogram for malignant neoplasm of breast: Secondary | ICD-10-CM

## 2023-07-05 NOTE — Progress Notes (Signed)
Carelink Summary Report / Loop Recorder 

## 2023-07-15 ENCOUNTER — Ambulatory Visit
Admission: RE | Admit: 2023-07-15 | Discharge: 2023-07-15 | Disposition: A | Discharge status: Home / Self Care | Payer: Medicare Other | Source: Ambulatory Visit | Attending: Internal Medicine | Admitting: Internal Medicine

## 2023-07-15 DIAGNOSIS — Z1231 Encounter for screening mammogram for malignant neoplasm of breast: Secondary | ICD-10-CM | POA: Insufficient documentation

## 2023-07-16 ENCOUNTER — Inpatient Hospital Stay
Admission: RE | Admit: 2023-07-16 | Discharge: 2023-07-16 | Disposition: A | Discharge status: Home / Self Care | Payer: Self-pay | Source: Ambulatory Visit | Attending: Internal Medicine | Admitting: Internal Medicine

## 2023-07-16 ENCOUNTER — Other Ambulatory Visit: Payer: Self-pay | Admitting: *Deleted

## 2023-07-16 DIAGNOSIS — Z1231 Encounter for screening mammogram for malignant neoplasm of breast: Secondary | ICD-10-CM

## 2023-07-26 ENCOUNTER — Ambulatory Visit (INDEPENDENT_AMBULATORY_CARE_PROVIDER_SITE_OTHER): Payer: Medicare Other

## 2023-07-26 DIAGNOSIS — I639 Cerebral infarction, unspecified: Secondary | ICD-10-CM | POA: Diagnosis not present

## 2023-07-27 LAB — CUP PACEART REMOTE DEVICE CHECK
Date Time Interrogation Session: 20240913230112
Implantable Pulse Generator Implant Date: 20221019

## 2023-08-09 NOTE — Progress Notes (Signed)
Carelink Summary Report / Loop Recorder 

## 2023-08-10 LAB — EXTERNAL GENERIC LAB PROCEDURE

## 2023-08-10 LAB — COLOGUARD

## 2023-08-30 ENCOUNTER — Ambulatory Visit (INDEPENDENT_AMBULATORY_CARE_PROVIDER_SITE_OTHER): Payer: Medicare Other

## 2023-08-30 DIAGNOSIS — I639 Cerebral infarction, unspecified: Secondary | ICD-10-CM | POA: Diagnosis not present

## 2023-08-31 LAB — CUP PACEART REMOTE DEVICE CHECK
Date Time Interrogation Session: 20241020230912
Implantable Pulse Generator Implant Date: 20221019

## 2023-09-15 NOTE — Progress Notes (Signed)
Carelink Summary Report / Loop Recorder 

## 2023-10-04 ENCOUNTER — Ambulatory Visit (INDEPENDENT_AMBULATORY_CARE_PROVIDER_SITE_OTHER): Payer: Medicare Other

## 2023-10-04 DIAGNOSIS — I639 Cerebral infarction, unspecified: Secondary | ICD-10-CM

## 2023-10-04 LAB — CUP PACEART REMOTE DEVICE CHECK
Date Time Interrogation Session: 20241122230454
Implantable Pulse Generator Implant Date: 20221019

## 2023-10-21 ENCOUNTER — Encounter: Payer: Self-pay | Admitting: Physician Assistant

## 2023-10-21 ENCOUNTER — Ambulatory Visit: Payer: Medicare Other | Attending: Physician Assistant | Admitting: Physician Assistant

## 2023-10-21 VITALS — BP 111/72 | HR 68 | Ht 62.0 in | Wt 117.8 lb

## 2023-10-21 DIAGNOSIS — I5032 Chronic diastolic (congestive) heart failure: Secondary | ICD-10-CM | POA: Diagnosis not present

## 2023-10-21 DIAGNOSIS — E878 Other disorders of electrolyte and fluid balance, not elsewhere classified: Secondary | ICD-10-CM | POA: Diagnosis not present

## 2023-10-21 DIAGNOSIS — I4891 Unspecified atrial fibrillation: Secondary | ICD-10-CM

## 2023-10-21 DIAGNOSIS — I2510 Atherosclerotic heart disease of native coronary artery without angina pectoris: Secondary | ICD-10-CM

## 2023-10-21 DIAGNOSIS — E785 Hyperlipidemia, unspecified: Secondary | ICD-10-CM | POA: Diagnosis not present

## 2023-10-21 DIAGNOSIS — I10 Essential (primary) hypertension: Secondary | ICD-10-CM

## 2023-10-21 DIAGNOSIS — I639 Cerebral infarction, unspecified: Secondary | ICD-10-CM

## 2023-10-21 NOTE — Patient Instructions (Signed)
Medication Instructions:  Your Physician recommend you continue on your current medication as directed.    *If you need a refill on your cardiac medications before your next appointment, please call your pharmacy*   Lab Work: Your provider would like for you to have following labs drawn today Lipid panel, CMET, and Magnesium level..   If you have labs (blood work) drawn today and your tests are completely normal, you will receive your results only by: MyChart Message (if you have MyChart) OR A paper copy in the mail If you have any lab test that is abnormal or we need to change your treatment, we will call you to review the results.   Follow-Up: At [facility], you and your health needs are our priority.  As part of our continuing mission to provide you with exceptional heart care, we have created designated Provider Care Teams.  These Care Teams include your primary Cardiologist (physician) and Advanced Practice Providers (APPs -  Physician Assistants and Nurse Practitioners) who all work together to provide you with the care you need, when you need it.  We recommend signing up for the patient portal called "MyChart".  Sign up information is provided on this After Visit Summary.  MyChart is used to connect with patients for Virtual Visits (Telemedicine).  Patients are able to view lab/test results, encounter notes, upcoming appointments, etc.  Non-urgent messages can be sent to your provider as well.   To learn more about what you can do with MyChart, go to [email or web address].    Your next appointment:   6 month(s)  Provider:   You may see Julien Nordmann, MD or one of the following Advanced Practice Providers on your designated Care Team:    Eula Listen, [place]

## 2023-10-21 NOTE — Progress Notes (Signed)
Cardiology Office Note    Date:  10/21/2023   ID:  [name], [name] 1956/04/13, MRN [id number]  PCP:  Gracelyn Nurse, MD  Cardiologist:  Julien Nordmann, MD  Electrophysiologist:  Lanier Prude, MD   Chief Complaint: Follow up  History of Present Illness:   [name] is a 67 y.o. female with history of CAD with NSTEMI in 03/2013 medically managed with prior stenting to the distal RCA in 12/2013, HFrEF secondary to ICM with subsequent normalization of LVSF, recurrent CVAs (08/2013, 06/2021, 07/2021, 06/2022), lone Afib noted on ILR in 08/2022, ICH in 04/2021, HTN, HLD, migraine disorder, tobacco use, depression, and GERD who presents for follow-up of CAD and cardiomyopathy.  [name] was admitted to the hospital in 03/2013 with an NSTEMI.  Cath demonstrated two-vessel CAD with an occluded mid RCA with collaterals from the distal LAD.  Medical management was advised.  Remaining cath details included 75% stenosis in D1 near the ostium, 40% mid LAD, 30% distal LAD, 50% D2 disease, 30% proximal circumflex, 75% proximal RCA disease.  Repeat cath in 12/2013 showed 95% distal RCA stenosis status post PCI/DES as well as 40% mid LAD, 30% distal LAD, 80% D1 disease, 60% D2 disease, 50% ostial RCA disease, 50% proximal RCA disease, 60% mid RCA disease.  Most recent cath from 08/2016 showed left LAD 30% proximal stenosis, 40% mid LAD disease, 70% ostial and proximal D2 and D3 disease, RCA 40 to 50% ostial disease, 30% proximal RCA disease. LVSF normal.  Given stability of CAD, medical management was advised.   [name] was admitted to [facility] in 08/2013 with a CVA.  Surface echo at that time showed an EF of 60 to 65%, no regional wall motion abnormalities, and mild mitral regurgitation.  Carotid artery ultrasound showed bilateral 1 to 39% ICA stenosis with antegrade vertebral artery flow.   [name] was admitted in 04/2021 with right thalamic hemorrhagic CVA with right to left midline shift felt to be in the setting of  hypertensive emergency.  Echo showed an EF of 55 to 60%, no regional wall motion normalities, grade 1 diastolic dysfunction, normal RV systolic function and ventricular cavity size, trivial circumferential pericardial effusion, mild aortic valve insufficiency, mild aortic valve sclerosis without evidence of stenosis, and an estimated right atrial pressure of 3 mmHg.  Carotid artery ultrasound showed less than 50% bilateral ICA stenosis.   [name] was admitted in 06/2021 with a small acute left cerebral infarct and a subacute to chronic right corona radiata lacunar infarct.  Echo showed an EF of 60 to 65%, no regional wall motion abnormalities, moderate LVH, grade 1 diastolic dysfunction, normal RV systolic function and ventricular cavity size, trivial mitral regurgitation, and mild to moderate aortic insufficiency.  Given recent ICH in 04/2021, neurology recommended single antiplatelet therapy.  Outpatient cardiac monitoring demonstrated a predominant rhythm of sinus with an average heart rate of 59 (range 43-1 41), 1 run of NSVT lasting 4 beats, rare PACs and PVCs, and no significant sustained arrhythmias.  Patient triggered events were associated with sinus rhythm.   [name] was admitted in 07/2021 with small acute to subacute infarct in the left temporal lobe that was new compared to study in 06/2021.  No acute intracranial hemorrhage.  [name] was deemed to be outside of tPA window.  Given history of bilateral strokes, [name] underwent TEE which was negative for intracardiac shunt.  Neurology recommended discharging the patient on aspirin, Plavix, and statin.  At time of discharge, [name] noted  persistent dizziness, though this was improving.  [name] underwent ILR implantation in 08/2021.  Echo in 03/2022 showed an EF of 60 to 65%, no regional wall motion abnormalities, grade 1 diastolic dysfunction, normal RV systolic function and ventricular cavity size, mild aortic insufficiency, and an estimated right atrial pressure of 3  mmHg.  [name] was admitted in 06/2022 with cranial hemorrhage felt to be hypertensive in etiology.  Echo during that admission showed an EF of 65 to 70%, no regional wall motion abnormalities, mild concentric LVH, grade 1 diastolic dysfunction, normal RV systolic function and ventricular cavity size, mild aortic insufficiency, aortic valve sclerosis without evidence of stenosis, and an estimated right atrial pressure of 3 mmHg.  [name] was subsequently found to have documented recurrence of A-fib in 08/2022 (first documented occurrence) with a 2-minute episode detected on 08/14/2022.  [name] was evaluated EP on 10/14/2022 and was not taking antiplatelet or anticoagulant at that time.  Given her history of ICH, and an episode of A-fib that lasted 2 minutes, the risks of anticoagulation were felt to outweigh any potential benefit with recommendation to continue to monitor ILR to guide any future decision regarding anticoagulation.  Subsequent loop monitor interrogations have shown no further episodes of A-fib.  [name] was last seen by EP in 01/2023 with recommendation to continue aspirin 81 mg daily along with monitoring with ILR.  [name] comes in doing well from a cardiac perspective and is without symptoms of angina or cardiac decompensation.  No dizziness, presyncope, or syncope.  No palpitations.  No falls, hematochezia, or melena.  [name] indicates that [name] was off of rosuvastatin when her cholesterol was checked this past summer.  [name] is now back on Crestor.  Blood pressure remains well-controlled at home.  [name] does not have any acute cardiac concerns at this time.   Labs independently reviewed: 06/2023 - potassium 4.0, BUN 18, serum creatinine 1.0, albumin 4.3, AST/ALT normal, Hgb 12.8, PLT 270, TC 257, TG 134, HDL 54, LDL 176 06/2022 - magnesium 1.9, A1c 6.0 07/2021 - TSH normal   Past Medical History:  Diagnosis Date   Anxiety    Arthritis    CAD (coronary artery disease)    a. 03/2013 NSTEMI/Cath: 100 RCA, EF  45%->Med Rx;  b. 12/2013 Cath/PCI: LAD 68m, 30d, D1 80, D2 60, LCX nl, RCA 50ost/p, 3m, 95d (2.5x33 Xience DES);  c. 05/2014 Lexi MV: EF 68%, no ischemia; d. stress echo 12/2015 no ischemia @ max exercise (did not achieve target)   Carotid disease, bilateral (HCC)    a. 08/2013 Carotid U/S: 1-39% bilat ICA stenosis.   Chronic kidney disease    Chronic kidney infections. Takes daily preventative.   Complication of anesthesia    CVA (cerebral vascular accident) (HCC)    a. 08/2013.   CVA (cerebral vascular accident) (HCC)    Decreased libido    Depression    GERD (gastroesophageal reflux disease)    History of 2019 novel coronavirus disease (COVID-19) 07/2019   History of recurrent UTIs    Hypercholesteremia    Hyperlipemia    Hypertension    Insomnia    Ischemic cardiomyopathy    a. 03/2013 Echo: EF 30-35%, mod dil LA, mod-sev MR, mod TR;  b. 08/2013 Echo EF 60-65%, mild AI.   Menopause    Migraines    Myocardial infarction (HCC)    PONV (postoperative nausea and vomiting)    If anesthsia is given slowly, pt will not throw up, if given quickly, pt will have  nausea and vomiting.   Right lower quadrant pain    Syncope and collapse    Tobacco abuse    Vaginal atrophy     Past Surgical History:  Procedure Laterality Date   CARDIAC CATHETERIZATION  01/01/2014   CARDIAC CATHETERIZATION  03/16/2013   CARDIAC CATHETERIZATION  12/2013   CARDIAC CATHETERIZATION N/A 08/19/2016   Procedure: Left Heart Cath and Coronary Angiography;  Surgeon: Antonieta Iba, MD;  Location: [facility] INVASIVE CV [facility];  Service: Cardiovascular;  Laterality: N/A;   CESAREAN SECTION WITH BILATERAL TUBAL LIGATION     CORONARY ANGIOPLASTY WITH STENT PLACEMENT  01/01/2014   95% lesion with a drug eluting stent to the distal RCA.   ENDOMETRIAL ABLATION     KNEE ARTHROSCOPY  11/27/2006   left knee    OOPHORECTOMY     TEE WITHOUT CARDIOVERSION N/A 07/25/2021   Procedure: TRANSESOPHAGEAL ECHOCARDIOGRAM (TEE);  Surgeon:  Antonieta Iba, MD;  Location: [facility] ORS;  Service: Cardiovascular;  Laterality: N/A;   TOTAL KNEE ARTHROPLASTY Left 10/06/2016   Procedure: TOTAL KNEE ARTHROPLASTY;  Surgeon: Christena Flake, MD;  Location: [facility] ORS;  Service: Orthopedics;  Laterality: Left;   TOTAL KNEE ARTHROPLASTY Right 11/12/2020   Procedure: TOTAL KNEE ARTHROPLASTY;  Surgeon: Christena Flake, MD;  Location: [facility] ORS;  Service: Orthopedics;  Laterality: Right;   TUBAL LIGATION      Current Medications: Current Meds  Medication Sig   amLODipine (NORVASC) 10 MG tablet Take 1 tablet (10 mg total) by mouth daily.   aspirin EC 81 MG EC tablet Take 1 tablet (81 mg total) by mouth daily. Swallow whole.   lisinopril (ZESTRIL) 5 MG tablet Take 1 tablet (5 mg total) by mouth daily.   meclizine (ANTIVERT) 25 MG tablet Take 1 tablet (25 mg total) by mouth 3 (three) times daily as needed for dizziness.   metoprolol succinate (TOPROL-XL) 50 MG 24 hr tablet TAKE 1 TABLET(50 MG) BY MOUTH DAILY WITH OR IMMEDIATELY FOLLOWING A MEAL   nitroGLYCERIN (NITROSTAT) 0.4 MG SL tablet Place 1 tablet (0.4 mg total) under the tongue every 5 (five) minutes as needed for chest pain (If you experience chest pain: Place 1 nitroglycerin under your tongue, while sitting. If no relief of pain, may repeat 1 tablet every 5 minutes up to 3 tablets over 15 minutes. If no relief call 911. If you have dizziness/lightheadedness while taking nitroglycerin, stop taking and call 911.). (Patient taking differently: Place 0.4 mg under the tongue every 5 (five) minutes as needed for chest pain.)   pantoprazole (PROTONIX) 40 MG tablet Take 40 mg by mouth 2 (two) times daily.   rosuvastatin (CRESTOR) 40 MG tablet Take 1 tablet (40 mg total) by mouth daily.    Allergies:   Codeine sulfate and Penicillins   Social History   Socioeconomic History   Marital status: Single    Spouse name: Not on file   Number of children: 1   Years of education: Not on file   Highest  education level: Not on file  Occupational History   Occupation: lab tech    Employer: [employer]  Tobacco Use   Smoking status: Former    Current packs/day: 0.00    Average packs/day: 1.5 packs/day for 20.0 years (30.0 ttl pk-yrs)    Types: Cigarettes    Start date: 04/07/1993    Quit date: 04/07/2013    Years since quitting: 10.5   Smokeless tobacco: Never  Vaping Use   Vaping status: Never  Used  Substance and Sexual Activity   Alcohol use: Not Currently    Comment: 1 glass of wine a month   Drug use: No   Sexual activity: Not Currently    Birth control/protection: None  Other Topics Concern   Not on file  Social History Narrative   Lives alone, not compliant w/ med regimen   Social Drivers of Health   Financial Resource Strain: Not on file  Food Insecurity: Not on file  Transportation Needs: Not on file  Physical Activity: Inactive (02/22/2018)   Exercise Vital Sign    Days of Exercise per Week: 0 days    Minutes of Exercise per Session: 0 min  Stress: Not on file  Social Connections: Not on file     Family History:  The patient's family history includes Breast cancer in her paternal grandmother; Hyperlipidemia in her mother; Hypertension in her mother, sister, and sister; Stroke in her mother. There is no history of Colon cancer, Ovarian cancer, Heart disease, or Diabetes.  ROS:   12-point review of systems is negative unless otherwise noted in the HPI.   EKGs/Labs/Other Studies Reviewed:    Studies reviewed were summarized above. The additional studies were reviewed today:  2D echo 06/19/2022: 1. Left ventricular ejection fraction, by estimation, is 65 to 70%. The  left ventricle has normal function. The left ventricle has no regional  wall motion abnormalities. There is mild concentric left ventricular  hypertrophy. Left ventricular diastolic  parameters are consistent with Grade I diastolic dysfunction (impaired  relaxation).   2. Right  ventricular systolic function is normal. The right ventricular  size is normal. Tricuspid regurgitation signal is inadequate for assessing  PA pressure.   3. The mitral valve is normal in structure. No evidence of mitral valve  regurgitation.   4. The aortic valve is tricuspid. There is mild calcification of the  aortic valve. There is mild thickening of the aortic valve. Aortic valve  regurgitation is mild. Aortic valve sclerosis/calcification is present,  without any evidence of aortic  stenosis.   5. The inferior vena cava is normal in size with greater than 50%  respiratory variability, suggesting right atrial pressure of 3 mmHg.  __________  2D echo 03/12/2022: 1. Left ventricular ejection fraction, by estimation, is 60 to 65%. Left  ventricular ejection fraction by 2D MOD biplane is 68.7 %. The left  ventricle has normal function. The left ventricle has no regional wall  motion abnormalities. Left ventricular  diastolic parameters are consistent with Grade I diastolic dysfunction  (impaired relaxation). The average left ventricular global longitudinal  strain is -20.9 %. The global longitudinal strain is normal.   2. Right ventricular systolic function is normal. The right ventricular  size is normal.   3. The mitral valve is normal in structure. No evidence of mitral valve  regurgitation.   4. The aortic valve is tricuspid. Aortic valve regurgitation is mild.   5. The inferior vena cava is normal in size with greater than 50%  respiratory variability, suggesting right atrial pressure of 3 mmHg.   Comparison(s): LVEF 60-65%.  __________  Luci Bank patch 07/2021 Patient had a min HR of 45 bpm, max HR of 145 bpm, and avg HR of 62 bpm.  Predominant underlying rhythm was Sinus Rhythm.    4 Supraventricular Tachycardia runs occurred, the run with the fastest interval lasting 4 beats with a max rate of 145 bpm, the longest lasting 4 beats with an avg rate of 96 bpm.  Isolated SVEs were  rare (<1.0%), SVE Couplets were rare (<1.0%), and SVE Triplets were rare (<1.0%).  Isolated VEs were rare (<1.0%), and no VE Couplets or VE Triplets were present.    Patient triggered event not associated with significant arrhythmia __________   TEE 07/25/2021: 1. Left ventricular ejection fraction, by estimation, is 60 to 65%. The  left ventricle has normal function. The left ventricle has no regional  wall motion abnormalities.   2. Right ventricular systolic function is normal. The right ventricular  size is normal.   3. No left atrial/left atrial appendage thrombus was detected.   4. The mitral valve is normal in structure. Trivial mitral valve  regurgitation. No evidence of mitral stenosis.   5. The aortic valve is normal in structure. Aortic valve regurgitation is  mild to moderate. No aortic stenosis is present.   6. There is mild (Grade II) atheroma plaque.   7. The inferior vena cava is normal in size with greater than 50%  respiratory variability, suggesting right atrial pressure of 3 mmHg.   8. Agitated saline contrast bubble study was negative, with no evidence  of any interatrial shunt.   Conclusion(s)/Recommendation(s): Normal biventricular function without  evidence of hemodynamically significant valvular heart disease.  __________   Luci Bank patch 06/2021: Patch Wear Time:  14 days and 0 hours (2022-08-24T09:32:15-0400 to 2022-09-07T09:32:15-0400)   Normal sinus rhythm Patient had a min HR of 43 bpm, max HR of 141 bpm, and avg HR of 59 bpm.    1 run of Ventricular Tachycardia occurred lasting 4 beats with a max rate of 141 bpm (avg 118 bpm). Isolated SVEs were rare (<1.0%),  SVE Triplets were rare (<1.0%), and no SVE Couplets were present.   Isolated VEs were rare (<1.0%), and no VE Couplets or VE Triplets were present.    Patient triggered event associated with normal sinus __________   2D echo 07/01/2021: 1. Left ventricular ejection fraction, by estimation, is 60  to 65%. The  left ventricle has normal function. The left ventricle has no regional  wall motion abnormalities. There is moderate left ventricular hypertrophy.  Left ventricular diastolic  parameters are consistent with Grade I diastolic dysfunction (impaired  relaxation).   2. Right ventricular systolic function is normal. The right ventricular  size is normal. Tricuspid regurgitation signal is inadequate for assessing  PA pressure.   3. The mitral valve was not well visualized. Trivial mitral valve  regurgitation. No evidence of mitral stenosis.   4. The aortic valve is tricuspid. Aortic valve regurgitation is mild to  moderate.  __________   Carotid artery ultrasound 04/10/2021: IMPRESSION: Bilateral carotid atherosclerosis. No hemodynamically significant ICA stenosis. Degree of narrowing less than 50% bilaterally by ultrasound criteria.   Patent antegrade vertebral flow bilaterally __________   2D echo 04/10/2021: 1. Left ventricular ejection fraction, by estimation, is 55 to 60%. The  left ventricle has normal function. The left ventricle has no regional  wall motion abnormalities. Left ventricular diastolic parameters are  consistent with Grade I diastolic  dysfunction (impaired relaxation). The global longitudinal strain is  normal.   2. Right ventricular systolic function is normal. The right ventricular  size is normal. Tricuspid regurgitation signal is inadequate for assessing  PA pressure.   3. Trivial pericardial effusion is present. The pericardial effusion is  circumferential.   4. The mitral valve is normal in structure. No evidence of mitral valve  regurgitation. No evidence of mitral stenosis.   5. The aortic valve is  normal in structure. Aortic valve regurgitation is  mild. Mild aortic valve sclerosis is present, with no evidence of aortic  valve stenosis.   6. The inferior vena cava is normal in size with greater than 50%  respiratory variability, suggesting  right atrial pressure of 3 mmHg. __________   LHC 08/19/2016: Coronary angiography:  Coronary dominance: Right  Left mainstem:   Large vessel that bifurcates into the LAD and left circumflex, no significant disease noted  Left anterior descending (LAD):   Large vessel that extends to the apical region, diagonal branch 2 of small to moderate size,  30% proximal LAD disease, 40% mid LAD disease.  70% ostial and proximal disease of the D2 and D3 vessel  Left circumflex (LCx):  Large vessel with OM branch 2, no significant disease noted. Small vessel  Right coronary artery (RCA):  Right dominant vessel with PL and PDA, 40 to 50% ostial disease, 30% proximal disease.   Left ventriculography: Left ventricular systolic function is normal, LVEF is estimated at 55-65%, there is no significant mitral regurgitation , no significant aortic valve stenosis  Final Conclusions:   Stable mild to moderate LAD disease Moderate to severe ostial and proximal diagonal disease (unchanged or improved from previous study) Patent RCA disease Mild to Moderate ostial RCA disease, mild proximal RCA disease Normal EF  No intervention indicated at this time  Recommendations:  There was ostial RCA spasm, improved with IC NTG Review of previous cath from 2015 suggests some component of coronary spasm.  If [name] continues to have chest pain, could increase imdur. Would continue NTG as needed for chest pain Ranexa if pain becomes more frequent. __________   Stress echo 01/30/2016: - Stress ECG conclusions: There were no stress arrhythmias or    conduction abnormalities. The stress ECG was normal, although    target heart rate was not achieved.  - Staged echo: Left ventricular ejection fraction was normal at    rest and with stress. EF 55% at rest, improving to 65 to 70% with    stress. Normal echo stress, although target heart rate    suboptimal.   Impressions:   - Normal study after maximal exercise,  although target heart rate    was not achieved.  __________   2D echo 02/19/2015: - Left ventricle: The cavity size was normal. Wall thickness was    normal. Systolic function was normal. The estimated ejection    fraction was in the range of 55% to 60%. Wall motion was normal;    there were no regional wall motion abnormalities. Doppler    parameters are consistent with abnormal left ventricular    relaxation (grade 1 diastolic dysfunction).  - Aortic valve: There was mild regurgitation.  - Mitral valve: Calcified annulus.  - Atrial septum: There was increased thickness of the septum,    consistent with lipomatous hypertrophy.   Impressions:   - Normal LV function; grade 1 diastolic dysfunction; mild AI.  __________   Carotid artery ultrasound 02/18/2015: Summary:  Bilateral: intimal wall thickening CCA. Mild calcific plaque origin  ICA. 1-39% ICA stenosis. Vertebral artery flow is antegrade.  __________   For more remote imaging see CV studies    EKG:  EKG is ordered today.  The EKG ordered today demonstrates NSR, 68 bpm, left axis deviation, poor R wave progression along the precordial leads, nonspecific ST-T changes with improved TWI from prior tracing, QTc difficult to calculate with ST-T changes with QTc on rhythm strip normalized  Recent Labs:  No results found for requested labs within last 365 days.  Recent Lipid Panel    Component Value Date/Time   CHOL 174 06/20/2022 0937   CHOL 174 09/29/2021 1150   CHOL 192 05/15/2014 0500   TRIG 206 (H) 06/20/2022 0937   TRIG 155 05/15/2014 0500   HDL 58 06/20/2022 0937   HDL 62 09/29/2021 1150   HDL 51 05/15/2014 0500   CHOLHDL 3.0 06/20/2022 0937   VLDL 41 (H) 06/20/2022 0937   VLDL 31 05/15/2014 0500   LDLCALC 75 06/20/2022 0937   LDLCALC 90 09/29/2021 1150   LDLCALC 110 (H) 05/15/2014 0500   LDLDIRECT 94 09/29/2021 1150    PHYSICAL EXAM:    VS:  BP 111/72 (BP Location: Left Arm, Patient Position: Sitting, Cuff  Size: Normal)   Pulse 68   Ht 5\' 2"  (1.575 m)   Wt 117 lb 12.8 oz (53.4 kg)   SpO2 98%   BMI 21.55 kg/m   BMI: Body mass index is 21.55 kg/m.  Physical Exam Vitals reviewed.  Constitutional:      Appearance: [name] is well-developed.  HENT:     Head: Normocephalic and atraumatic.  Eyes:     General:        Right eye: No discharge.        Left eye: No discharge.  Neck:     Vascular: No JVD.  Cardiovascular:     Rate and Rhythm: Normal rate and regular rhythm.     Pulses:          Posterior tibial pulses are 2+ on the right side and 2+ on the left side.     Heart sounds: Normal heart sounds, S1 normal and S2 normal. Heart sounds not distant. No midsystolic click and no opening snap. No murmur heard.    No friction rub.  Pulmonary:     Effort: Pulmonary effort is normal. No respiratory distress.     Breath sounds: Normal breath sounds. No decreased breath sounds, wheezing, rhonchi or rales.  Chest:     Chest wall: No tenderness.  Abdominal:     General: There is no distension.  Musculoskeletal:     Cervical back: Normal range of motion.     Right lower leg: No edema.     Left lower leg: No edema.  Skin:    General: Skin is warm and dry.     Nails: There is no clubbing.  Neurological:     Mental Status: [name] is alert and oriented to person, place, and time.  Psychiatric:        Speech: Speech normal.        Behavior: Behavior normal.        Thought Content: Thought content normal.        Judgment: Judgment normal.     Wt Readings from Last 3 Encounters:  10/21/23 117 lb 12.8 oz (53.4 kg)  01/13/23 120 lb 12.8 oz (54.8 kg)  10/14/22 122 lb (55.3 kg)     ASSESSMENT & PLAN:   CAD involving the native coronary arteries without angina: [name] is doing well and without symptoms of angina or cardiac decompensation.  Continue aggressive risk factor modification and secondary prevention including aspirin, amlodipine, and rosuvastatin.  HFimpEF: [name] is euvolemic and well  compensated.  NYHA class is difficult to assess secondary to sedentary lifestyle in the setting of her significant comorbidities.  [name] remains on lisinopril 5 mg and Toprol-XL 50 mg.  [name] reports that [name] is not currently  taking furosemide.  Check renal function and electrolytes.  Defer escalation of GDMT at this time given normalization of LV systolic function and in the setting of lack of heart failure symptoms.  Lone Afib: Isolated episode lasting 2 minutes in 08/2022.  No further episodes since.  EP has deferred addition of OAC given brief episode of A-fib again in the context of ICH.  ILR remains active.  Ongoing management per EP.  Recurrent CVA/ICH: No new deficits.  Remains on aspirin and statin.  Follow-up with PCP and neurology as directed.  HTN: Blood pressure is well-controlled in the office today.  Remains on lisinopril and Toprol-XL as outlined above.  HLD: LDL 176 in 06/2023, not on rosuvastatin at that time.  [name] is now back on rosuvastatin 40 mg daily.  Check CMP and lipid panel.  [name] indicates [name] is fasting today.    Disposition: F/u with Dr. Mariah Milling or an APP in 6 months, and EP as directed.    Medication Adjustments/Labs and Tests Ordered: Current medicines are reviewed at length with the patient today.  Concerns regarding medicines are outlined above. Medication changes, Labs and Tests ordered today are summarized above and listed in the Patient Instructions accessible in Encounters.   Signed, Eula Listen, PA-C 10/21/2023 2:50 PM     [facility] -  [address] [place], [place] [address] [phone]

## 2023-10-22 LAB — COMPREHENSIVE METABOLIC PANEL
ALT: 18 [IU]/L (ref 0–32)
AST: 17 [IU]/L (ref 0–40)
Albumin: 4.4 g/dL (ref 3.9–4.9)
Alkaline Phosphatase: 94 [IU]/L (ref 44–121)
BUN/Creatinine Ratio: 18 (ref 12–28)
BUN: 17 mg/dL (ref 8–27)
Bilirubin Total: 0.4 mg/dL (ref 0.0–1.2)
CO2: 23 mmol/L (ref 20–29)
Calcium: 9.8 mg/dL (ref 8.7–10.3)
Chloride: 103 mmol/L (ref 96–106)
Creatinine, Ser: 0.95 mg/dL (ref 0.57–1.00)
Globulin, Total: 2.6 g/dL (ref 1.5–4.5)
Glucose: 90 mg/dL (ref 70–99)
Potassium: 4.2 mmol/L (ref 3.5–5.2)
Sodium: 141 mmol/L (ref 134–144)
Total Protein: 7 g/dL (ref 6.0–8.5)
eGFR: 66 mL/min/{1.73_m2} (ref >59)

## 2023-10-22 LAB — LIPID PANEL
Chol/HDL Ratio: 2.8 {ratio} (ref 0.0–4.4)
Cholesterol, Total: 148 mg/dL (ref 100–199)
HDL: 53 mg/dL (ref >39)
LDL Chol Calc (NIH): 75 mg/dL (ref 0–99)
Triglycerides: 110 mg/dL (ref 0–149)
VLDL Cholesterol Cal: 20 mg/dL (ref 5–40)

## 2023-10-22 LAB — MAGNESIUM: Magnesium: 1.9 mg/dL (ref 1.6–2.3)

## 2023-11-01 NOTE — Progress Notes (Signed)
Carelink Summary Report / Loop Recorder 

## 2023-11-08 ENCOUNTER — Ambulatory Visit (INDEPENDENT_AMBULATORY_CARE_PROVIDER_SITE_OTHER): Payer: Medicare Other

## 2023-11-08 DIAGNOSIS — I639 Cerebral infarction, unspecified: Secondary | ICD-10-CM | POA: Diagnosis not present

## 2023-11-09 LAB — CUP PACEART REMOTE DEVICE CHECK
Date Time Interrogation Session: 20241229230907
Implantable Pulse Generator Implant Date: 20221019

## 2023-11-12 ENCOUNTER — Other Ambulatory Visit: Payer: Self-pay | Admitting: Gastroenterology

## 2023-11-12 DIAGNOSIS — R1310 Dysphagia, unspecified: Secondary | ICD-10-CM

## 2023-11-22 ENCOUNTER — Ambulatory Visit
Admission: RE | Admit: 2023-11-22 | Discharge: 2023-11-22 | Disposition: A | Discharge status: Home / Self Care | Payer: Medicare Other | Source: Ambulatory Visit | Attending: Gastroenterology | Admitting: Gastroenterology

## 2023-11-22 ENCOUNTER — Other Ambulatory Visit: Payer: Self-pay | Admitting: Gastroenterology

## 2023-11-22 DIAGNOSIS — R1310 Dysphagia, unspecified: Secondary | ICD-10-CM

## 2023-12-02 ENCOUNTER — Other Ambulatory Visit: Payer: Self-pay | Admitting: Gastroenterology

## 2023-12-02 DIAGNOSIS — R933 Abnormal findings on diagnostic imaging of other parts of digestive tract: Secondary | ICD-10-CM

## 2023-12-02 DIAGNOSIS — R1310 Dysphagia, unspecified: Secondary | ICD-10-CM

## 2023-12-13 ENCOUNTER — Ambulatory Visit (INDEPENDENT_AMBULATORY_CARE_PROVIDER_SITE_OTHER): Payer: Self-pay

## 2023-12-13 DIAGNOSIS — I639 Cerebral infarction, unspecified: Secondary | ICD-10-CM | POA: Diagnosis not present

## 2023-12-13 LAB — CUP PACEART REMOTE DEVICE CHECK
Date Time Interrogation Session: 20250202230628
Implantable Pulse Generator Implant Date: 20221019

## 2023-12-16 ENCOUNTER — Ambulatory Visit
Admission: RE | Admit: 2023-12-16 | Discharge: 2023-12-16 | Disposition: A | Discharge status: Home / Self Care | Payer: Medicare Other | Source: Ambulatory Visit | Attending: Gastroenterology | Admitting: Gastroenterology

## 2023-12-16 DIAGNOSIS — R933 Abnormal findings on diagnostic imaging of other parts of digestive tract: Secondary | ICD-10-CM | POA: Insufficient documentation

## 2023-12-16 DIAGNOSIS — R1310 Dysphagia, unspecified: Secondary | ICD-10-CM | POA: Diagnosis present

## 2023-12-16 NOTE — Therapy (Signed)
 Modified Barium Swallow Study  Patient Details  Name: [name] MRN: [id number] Date of Birth: 1955/12/05  Today's Date: 12/16/2023  Modified Barium Swallow completed.  Full report located under Chart Review in the Imaging Section.  History of Present Illness Pt is a 68 y.o. female who presents for MBSS. PT reports having dysphagia since her CVA in August 2023 -she endorses that dysphagia is both to solids and liquids. She feels it occurs about equal to solids and liquids. Often feels she tends to cough with eating, but also stated her swallowing has been improving since recent Barium Swallow. Pt with hx of stroke with residual dysarthria, depression, anxiety, hx of MI, CHF, HLD. Head CT, 08/31/22, Resolution of previously seen left thalamic hemorrhage. Prior lacunar infarct in left basal ganglia. No acute abnormality noted. DG esophagus, 11/22/23, 1. Significant difficulty with initiating the swallowing mechanism   was observed with premature spillage to the vallecula with residual contrast observed in the vallecular and piriform sinuses. A trace   amount of aspiration was noted. Imaging findings suggest underlying difficulty with oropharyngeal motility. Referral to speech pathology is recommended for further evaluation. 2. Limited assessment of the esophagus confirms patency to the level   of the GE junction.   Clinical Impression Pt presents with a mild-moderate oral dysphagia and a grossly functional pharyngeal swallow. Oral deficits likely secondary sensorimotor changes in setting of multiple strokes and ICH. Question if cognitive component to pt's dysphagia as well. Oral phase notable for prolonged oral prep/mastication, oral holding, reduced oral control (when not cued to hold boluses in oral cavity) and piecemeal swallow. Oral deficits were most appreciated with solids and purees, but evident across all textures. Pharyngeally, pt with age-related swallowing changes including swallow  initiation at the vallecula/pyrifom sinuses and transient laryngeal penetation which occured during the swallow and cleared completely with swallow completion. Full screening of esophagus was not completed. See Barium Swallow results. Recommend a regular/mech soft diet (softer solids to promote efficiency) with thin liquids with safe swallowing strategies/aspiration precautions as outlined below. No f/u ST recommended at this time. Factors that may increase risk of adverse event in presence of aspiration [name] & [name] 2021): Limited mobility;Weak cough;Reduced cognitive function  Swallow Evaluation Recommendations Recommendations: PO diet PO Diet Recommendation: Regular;Dysphagia 3 (Mechanical soft);Thin liquids (Level 0) Liquid Administration via: Spoon;Cup;Straw Medication Administration: Whole meds with puree Supervision: Patient able to self-feed Swallowing strategies  : Slow rate;Small bites/sips;Follow solids with liquids Postural changes: Position pt fully upright for meals (upright at least 60 minutes after meals) Oral care recommendations: Oral care BID (2x/day)     Delon Bangs, M.S., CCC-SLP Speech-Language Pathologist [facility] [phone] (ASCOM)  Delon HERO Deshara Rossi 12/16/2023,1:19 PM

## 2024-01-17 ENCOUNTER — Ambulatory Visit (INDEPENDENT_AMBULATORY_CARE_PROVIDER_SITE_OTHER): Payer: Self-pay

## 2024-01-17 DIAGNOSIS — I639 Cerebral infarction, unspecified: Secondary | ICD-10-CM

## 2024-01-17 LAB — CUP PACEART REMOTE DEVICE CHECK
Date Time Interrogation Session: 20250309231017
Implantable Pulse Generator Implant Date: 20221019

## 2024-01-19 NOTE — Progress Notes (Signed)
 [facility] Summary Report / Loop Recorder

## 2024-02-21 ENCOUNTER — Ambulatory Visit (INDEPENDENT_AMBULATORY_CARE_PROVIDER_SITE_OTHER): Payer: Self-pay

## 2024-02-21 DIAGNOSIS — I639 Cerebral infarction, unspecified: Secondary | ICD-10-CM | POA: Diagnosis not present

## 2024-02-21 LAB — CUP PACEART REMOTE DEVICE CHECK
Date Time Interrogation Session: 20250413230919
Implantable Pulse Generator Implant Date: 20221019

## 2024-02-21 NOTE — Progress Notes (Signed)
 [facility] Summary Report / Loop Recorder

## 2024-03-27 ENCOUNTER — Ambulatory Visit (INDEPENDENT_AMBULATORY_CARE_PROVIDER_SITE_OTHER): Payer: Self-pay

## 2024-03-27 DIAGNOSIS — I639 Cerebral infarction, unspecified: Secondary | ICD-10-CM

## 2024-03-27 LAB — CUP PACEART REMOTE DEVICE CHECK
Date Time Interrogation Session: 20250518231609
Implantable Pulse Generator Implant Date: 20221019

## 2024-03-28 ENCOUNTER — Ambulatory Visit: Payer: Self-pay | Admitting: Cardiology

## 2024-04-10 NOTE — Progress Notes (Signed)
 [facility] Summary Report / Loop Recorder

## 2024-04-20 ENCOUNTER — Telehealth: Payer: Self-pay | Admitting: Cardiovascular Disease

## 2024-04-20 NOTE — Telephone Encounter (Signed)
 Pt c/o of Chest Pain: STAT if active (IN THIS MOMENT) CP, including tightness, pressure, jaw pain, shoulder/upper arm/back pain, SOB, nausea, and vomiting.  1. Are you having CP right now (tightness, pressure, or discomfort)?  No   2. Are you experiencing any other symptoms (ex. SOB, nausea, vomiting, sweating)?  No   3. How long have you been experiencing CP?  1 week  4. Is your CP continuous or coming and going?  Coming and going   5. Have you taken Nitroglycerin ?  No, doesn't have anymore. Tylenol  helps.  Patient mentions wanting to remove her ILR because the battery died in April 21, 2024.

## 2024-04-20 NOTE — Telephone Encounter (Signed)
 Patients device reached RRT in may and she would like to have it explanted. Routing to scheduling for apt.

## 2024-04-20 NOTE — Telephone Encounter (Signed)
 Called patient about message. Patient has been having Chest Pain that comes and goes for about a week. Patient stated she feels it when she is at rest. Patient did do some strenuous work, cleaning about a week ago. Patient stated Tylenol  does help. Patient stated she is fine right now, and denies any SOB or Tachycardia. Will send message to Dr. Gollan. Patient will be seen by Varney Gentleman PA next month. Patient would also like to get her Loop Recorder out as well.

## 2024-04-26 NOTE — Telephone Encounter (Signed)
 Spoke w/ patient son Edelmira Goodness - patient has been scheduled for loop explant with Dr. Marven Slimmer on 7/23 in [place]. Message sent to precert/RN

## 2024-04-27 ENCOUNTER — Ambulatory Visit (INDEPENDENT_AMBULATORY_CARE_PROVIDER_SITE_OTHER): Payer: Self-pay

## 2024-04-27 DIAGNOSIS — I639 Cerebral infarction, unspecified: Secondary | ICD-10-CM

## 2024-04-27 LAB — CUP PACEART REMOTE DEVICE CHECK
Date Time Interrogation Session: 20250618231029
Implantable Pulse Generator Implant Date: 20221019

## 2024-04-28 ENCOUNTER — Ambulatory Visit: Payer: Self-pay | Admitting: Cardiology

## 2024-05-15 NOTE — Progress Notes (Signed)
 [facility] Summary Report / Loop Recorder

## 2024-05-18 ENCOUNTER — Ambulatory Visit: Admitting: Physician Assistant

## 2024-05-19 ENCOUNTER — Ambulatory Visit: Attending: Physician Assistant | Admitting: Physician Assistant

## 2024-05-19 ENCOUNTER — Encounter: Payer: Self-pay | Admitting: Physician Assistant

## 2024-05-19 VITALS — BP 114/62 | HR 72 | Ht 61.5 in | Wt 124.0 lb

## 2024-05-19 DIAGNOSIS — R079 Chest pain, unspecified: Secondary | ICD-10-CM | POA: Diagnosis not present

## 2024-05-19 DIAGNOSIS — I5032 Chronic diastolic (congestive) heart failure: Secondary | ICD-10-CM

## 2024-05-19 DIAGNOSIS — I4891 Unspecified atrial fibrillation: Secondary | ICD-10-CM | POA: Diagnosis not present

## 2024-05-19 DIAGNOSIS — I10 Essential (primary) hypertension: Secondary | ICD-10-CM

## 2024-05-19 DIAGNOSIS — E785 Hyperlipidemia, unspecified: Secondary | ICD-10-CM

## 2024-05-19 DIAGNOSIS — I639 Cerebral infarction, unspecified: Secondary | ICD-10-CM

## 2024-05-19 DIAGNOSIS — I25118 Atherosclerotic heart disease of native coronary artery with other forms of angina pectoris: Secondary | ICD-10-CM

## 2024-05-19 NOTE — Progress Notes (Signed)
 Cardiology Office Note    Date:  05/19/2024   ID:  Nastasha, Reising July 15, 1956, MRN [id number]  PCP:  Rudolpho Norleen BIRCH, MD  Cardiologist:  Evalene Lunger, MD  Electrophysiologist:  OLE ONEIDA HOLTS, MD   Chief Complaint: Follow up  History of Present Illness:   [name] is a 68 y.o. female with history of CAD with NSTEMI in 03/2013 medically managed with prior stenting to the distal RCA in 12/2013, HFrEF secondary to ICM with subsequent normalization of LVSF, recurrent CVAs (08/2013, 06/2021, 07/2021, 06/2022), lone Afib noted on ILR in 08/2022, ICH in 04/2021, HTN, HLD, migraine disorder, tobacco use, depression, and GERD who presents for follow-up of CAD and cardiomyopathy.   She was admitted to the hospital in 03/2013 with an NSTEMI.  Cath demonstrated two-vessel CAD with an occluded mid RCA with collaterals from the distal LAD.  Medical management was advised.  Remaining cath details included 75% stenosis in D1 near the ostium, 40% mid LAD, 30% distal LAD, 50% D2 disease, 30% proximal circumflex, 75% proximal RCA disease.  Repeat cath in 12/2013 showed 95% distal RCA stenosis status post PCI/DES as well as 40% mid LAD, 30% distal LAD, 80% D1 disease, 60% D2 disease, 50% ostial RCA disease, 50% proximal RCA disease, 60% mid RCA disease.  Most recent cath from 08/2016 showed left LAD 30% proximal stenosis, 40% mid LAD disease, 70% ostial and proximal D2 and D3 disease, RCA 40 to 50% ostial disease, 30% proximal RCA disease. LVSF normal.  Given stability of CAD, medical management was advised.   She was admitted to [facility] in 08/2013 with a CVA.  Surface echo at that time showed an EF of 60 to 65%, no regional wall motion abnormalities, and mild mitral regurgitation.  Carotid artery ultrasound showed bilateral 1 to 39% ICA stenosis with antegrade vertebral artery flow.   She was admitted in 04/2021 with right thalamic hemorrhagic CVA with right to left midline shift felt to be in the setting of  hypertensive emergency.  Echo showed an EF of 55 to 60%, no regional wall motion normalities, grade 1 diastolic dysfunction, normal RV systolic function and ventricular cavity size, trivial circumferential pericardial effusion, mild aortic valve insufficiency, mild aortic valve sclerosis without evidence of stenosis, and an estimated right atrial pressure of 3 mmHg.  Carotid artery ultrasound showed less than 50% bilateral ICA stenosis.   She was admitted in 06/2021 with a small acute left cerebral infarct and a subacute to chronic right corona radiata lacunar infarct.  Echo showed an EF of 60 to 65%, no regional wall motion abnormalities, moderate LVH, grade 1 diastolic dysfunction, normal RV systolic function and ventricular cavity size, trivial mitral regurgitation, and mild to moderate aortic insufficiency.  Given recent ICH in 04/2021, neurology recommended single antiplatelet therapy.  Outpatient cardiac monitoring demonstrated a predominant rhythm of sinus with an average heart rate of 59 (range 43-1 41), 1 run of NSVT lasting 4 beats, rare PACs and PVCs, and no significant sustained arrhythmias.  Patient triggered events were associated with sinus rhythm.   She was admitted in 07/2021 with small acute to subacute infarct in the left temporal lobe that was new compared to study in 06/2021.  No acute intracranial hemorrhage.  She was deemed to be outside of tPA window.  Given history of bilateral strokes, she underwent TEE which was negative for intracardiac shunt.  Neurology recommended discharging the patient on aspirin , Plavix , and statin.  At time of discharge, she  noted persistent dizziness, though this was improving.   She underwent ILR implantation in 08/2021.   Echo in 03/2022 showed an EF of 60 to 65%, no regional wall motion abnormalities, grade 1 diastolic dysfunction, normal RV systolic function and ventricular cavity size, mild aortic insufficiency, and an estimated right atrial pressure of 3  mmHg.   She was admitted in 06/2022 with cranial hemorrhage felt to be hypertensive in etiology.  Echo during that admission showed an EF of 65 to 70%, no regional wall motion abnormalities, mild concentric LVH, grade 1 diastolic dysfunction, normal RV systolic function and ventricular cavity size, mild aortic insufficiency, aortic valve sclerosis without evidence of stenosis, and an estimated right atrial pressure of 3 mmHg.   She was subsequently found to have documented recurrence of A-fib in 08/2022 (first documented occurrence) with a 2-minute episode detected on 08/14/2022.  She was evaluated EP on 10/14/2022 and was not taking antiplatelet or anticoagulant at that time.  Given her history of ICH, and an episode of A-fib that lasted 2 minutes, the risks of anticoagulation were felt to outweigh any potential benefit with recommendation to continue to monitor ILR to guide any future decision regarding anticoagulation.  Subsequent loop monitor interrogations have shown no further episodes of A-fib, up to interrogation dated 04/2024.     She was last seen in the office in 10/2023 and was without symptoms of angina or cardiac decompensation.  She was back on her rosuvastatin .  She comes in today doing well from a cardiac perspective.  She reports an episode of left-sided chest discomfort that began at rest approximately 1 month ago and persisted throughout the night.  There was no associated shortness of breath, diaphoresis, nausea, or vomiting.  Upon waking up the next morning chest discomfort was resolved and she has been without chest pain since.  She did not seek medical care at that time or take any medications to alleviate her discomfort.  Symptoms did not feel similar to what she experienced leading up to her NSTEMI.  No new stroke deficits.  No dizziness, presyncope, or syncope.  No falls, hematochezia, or melena.   Labs independently reviewed: 01/2024 - potassium 4.2, BUN 16, serum creatinine 0.7,  Hgb 12.8, PLT 272 10/2023 - albumin 4.4, AST/ALT normal, TC 148, TG 110, HDL 53, LDL 75, magnesium  1.9 06/2022 - A1c 6.0 07/2021 - TSH normal  Past Medical History:  Diagnosis Date   Anxiety    Arthritis    CAD (coronary artery disease)    a. 03/2013 NSTEMI/Cath: 100 RCA, EF 45%->Med Rx;  b. 12/2013 Cath/PCI: LAD 26m, 30d, D1 80, D2 60, LCX nl, RCA 50ost/p, 25m, 95d (2.5x33 Xience DES);  c. 05/2014 Lexi MV: EF 68%, no ischemia; d. stress echo 12/2015 no ischemia @ max exercise (did not achieve target)   Carotid disease, bilateral (HCC)    a. 08/2013 Carotid U/S: 1-39% bilat ICA stenosis.   Chronic kidney disease    Chronic kidney infections. Takes daily preventative.   Complication of anesthesia    CVA (cerebral vascular accident) (HCC)    a. 08/2013.   CVA (cerebral vascular accident) (HCC)    Decreased libido    Depression    GERD (gastroesophageal reflux disease)    History of 2019 novel coronavirus disease (COVID-19) 07/2019   History of recurrent UTIs    Hypercholesteremia    Hyperlipemia    Hypertension    Insomnia    Ischemic cardiomyopathy    a. 03/2013 Echo: EF 30-35%,  mod dil LA, mod-sev MR, mod TR;  b. 08/2013 Echo EF 60-65%, mild AI.   Menopause    Migraines    Myocardial infarction (HCC)    PONV (postoperative nausea and vomiting)    If anesthsia is given slowly, pt will not throw up, if given quickly, pt will have nausea and vomiting.   Right lower quadrant pain    Syncope and collapse    Tobacco abuse    Vaginal atrophy     Past Surgical History:  Procedure Laterality Date   CARDIAC CATHETERIZATION  01/01/2014   CARDIAC CATHETERIZATION  03/16/2013   CARDIAC CATHETERIZATION  12/2013   CARDIAC CATHETERIZATION N/A 08/19/2016   Procedure: Left Heart Cath and Coronary Angiography;  Surgeon: Evalene JINNY Lunger, MD;  Location: [facility] INVASIVE CV [facility];  Service: Cardiovascular;  Laterality: N/A;   CESAREAN SECTION WITH BILATERAL TUBAL LIGATION     CORONARY ANGIOPLASTY WITH  STENT PLACEMENT  01/01/2014   95% lesion with a drug eluting stent to the distal RCA.   ENDOMETRIAL ABLATION     KNEE ARTHROSCOPY  11/27/2006   left knee    OOPHORECTOMY     TEE WITHOUT CARDIOVERSION N/A 07/25/2021   Procedure: TRANSESOPHAGEAL ECHOCARDIOGRAM (TEE);  Surgeon: Lunger Evalene JINNY, MD;  Location: [facility] ORS;  Service: Cardiovascular;  Laterality: N/A;   TOTAL KNEE ARTHROPLASTY Left 10/06/2016   Procedure: TOTAL KNEE ARTHROPLASTY;  Surgeon: Norleen JINNY Maltos, MD;  Location: [facility] ORS;  Service: Orthopedics;  Laterality: Left;   TOTAL KNEE ARTHROPLASTY Right 11/12/2020   Procedure: TOTAL KNEE ARTHROPLASTY;  Surgeon: Maltos Norleen JINNY, MD;  Location: [facility] ORS;  Service: Orthopedics;  Laterality: Right;   TUBAL LIGATION      Current Medications: Current Meds  Medication Sig   amLODipine  (NORVASC ) 10 MG tablet Take 1 tablet (10 mg total) by mouth daily.   aspirin  EC 81 MG EC tablet Take 1 tablet (81 mg total) by mouth daily. Swallow whole.   lisinopril  (ZESTRIL ) 5 MG tablet Take 1 tablet (5 mg total) by mouth daily.   meclizine  (ANTIVERT ) 25 MG tablet Take 1 tablet (25 mg total) by mouth 3 (three) times daily as needed for dizziness.   metoprolol  succinate (TOPROL -XL) 50 MG 24 hr tablet TAKE 1 TABLET(50 MG) BY MOUTH DAILY WITH OR IMMEDIATELY FOLLOWING A MEAL   pantoprazole  (PROTONIX ) 40 MG tablet Take 40 mg by mouth 2 (two) times daily.   rosuvastatin  (CRESTOR ) 40 MG tablet Take 1 tablet (40 mg total) by mouth daily.    Allergies:   Codeine sulfate and Penicillins   Social History   Socioeconomic History   Marital status: Single    Spouse name: Not on file   Number of children: 1   Years of education: Not on file   Highest education level: Not on file  Occupational History   Occupation: lab tech    Employer: [employer]  Tobacco Use   Smoking status: Former    Current packs/day: 0.00    Average packs/day: 1.5 packs/day for 20.0 years (30.0 ttl pk-yrs)    Types:  Cigarettes    Start date: 04/07/1993    Quit date: 04/07/2013    Years since quitting: 11.1   Smokeless tobacco: Never  Vaping Use   Vaping status: Never Used  Substance and Sexual Activity   Alcohol  use: Not Currently    Comment: 1 glass of wine a month   Drug use: No   Sexual activity: Not Currently    Birth control/protection:  None  Other Topics Concern   Not on file  Social History Narrative   Lives alone, not compliant w/ med regimen   Social Drivers of Health   Financial Resource Strain: Low Risk  (11/11/2023)   Received from [facility]   Overall Financial Resource Strain (CARDIA)    Difficulty of Paying Living Expenses: Not very hard  Food Insecurity: Food Insecurity Present (11/11/2023)   Received from [facility]   Hunger Vital Sign    Within the past 12 months, you worried that your food would run out before you got the money to buy more.: Sometimes true    Within the past 12 months, the food you bought just didn't last and you didn't have money to get more.: Sometimes true  Transportation Needs: No Transportation Needs (11/11/2023)   Received from [facility] - Transportation    In the past 12 months, has lack of transportation kept you from medical appointments or from getting medications?: No    Lack of Transportation (Non-Medical): No  Physical Activity: Inactive (02/22/2018)   Exercise Vital Sign    Days of Exercise per Week: 0 days    Minutes of Exercise per Session: 0 min  Stress: Not on file  Social Connections: Not on file     Family History:  The patient's family history includes Breast cancer in her paternal grandmother; Hyperlipidemia in her mother; Hypertension in her mother, sister, and sister; Stroke in her mother. There is no history of Colon cancer, Ovarian cancer, Heart disease, or Diabetes.  ROS:   12-point review of systems is negative unless otherwise noted in the  HPI.   EKGs/Labs/Other Studies Reviewed:    Studies reviewed were summarized above. The additional studies were reviewed today:  2D echo 06/19/2022: 1. Left ventricular ejection fraction, by estimation, is 65 to 70%. The  left ventricle has normal function. The left ventricle has no regional  wall motion abnormalities. There is mild concentric left ventricular  hypertrophy. Left ventricular diastolic  parameters are consistent with Grade I diastolic dysfunction (impaired  relaxation).   2. Right ventricular systolic function is normal. The right ventricular  size is normal. Tricuspid regurgitation signal is inadequate for assessing  PA pressure.   3. The mitral valve is normal in structure. No evidence of mitral valve  regurgitation.   4. The aortic valve is tricuspid. There is mild calcification of the  aortic valve. There is mild thickening of the aortic valve. Aortic valve  regurgitation is mild. Aortic valve sclerosis/calcification is present,  without any evidence of aortic  stenosis.   5. The inferior vena cava is normal in size with greater than 50%  respiratory variability, suggesting right atrial pressure of 3 mmHg.  __________   2D echo 03/12/2022: 1. Left ventricular ejection fraction, by estimation, is 60 to 65%. Left  ventricular ejection fraction by 2D MOD biplane is 68.7 %. The left  ventricle has normal function. The left ventricle has no regional wall  motion abnormalities. Left ventricular  diastolic parameters are consistent with Grade I diastolic dysfunction  (impaired relaxation). The average left ventricular global longitudinal  strain is -20.9 %. The global longitudinal strain is normal.   2. Right ventricular systolic function is normal. The right ventricular  size is normal.   3. The mitral valve is normal in structure. No evidence of mitral valve  regurgitation.   4. The aortic valve is tricuspid. Aortic valve regurgitation is mild.  5. The inferior  vena cava is normal in size with greater than 50%  respiratory variability, suggesting right atrial pressure of 3 mmHg.   Comparison(s): LVEF 60-65%.  __________   Zio patch 07/2021 Patient had a min HR of 45 bpm, max HR of 145 bpm, and avg HR of 62 bpm.  Predominant underlying rhythm was Sinus Rhythm.    4 Supraventricular Tachycardia runs occurred, the run with the fastest interval lasting 4 beats with a max rate of 145 bpm, the longest lasting 4 beats with an avg rate of 96 bpm.   Isolated SVEs were rare (<1.0%), SVE Couplets were rare (<1.0%), and SVE Triplets were rare (<1.0%).  Isolated VEs were rare (<1.0%), and no VE Couplets or VE Triplets were present.    Patient triggered event not associated with significant arrhythmia __________   TEE 07/25/2021: 1. Left ventricular ejection fraction, by estimation, is 60 to 65%. The  left ventricle has normal function. The left ventricle has no regional  wall motion abnormalities.   2. Right ventricular systolic function is normal. The right ventricular  size is normal.   3. No left atrial/left atrial appendage thrombus was detected.   4. The mitral valve is normal in structure. Trivial mitral valve  regurgitation. No evidence of mitral stenosis.   5. The aortic valve is normal in structure. Aortic valve regurgitation is  mild to moderate. No aortic stenosis is present.   6. There is mild (Grade II) atheroma plaque.   7. The inferior vena cava is normal in size with greater than 50%  respiratory variability, suggesting right atrial pressure of 3 mmHg.   8. Agitated saline contrast bubble study was negative, with no evidence  of any interatrial shunt.   Conclusion(s)/Recommendation(s): Normal biventricular function without  evidence of hemodynamically significant valvular heart disease.  __________   Zio patch 06/2021: Patch Wear Time:  14 days and 0 hours (2022-08-24T09:32:15-0400 to 2022-09-07T09:32:15-0400)   Normal sinus  rhythm Patient had a min HR of 43 bpm, max HR of 141 bpm, and avg HR of 59 bpm.    1 run of Ventricular Tachycardia occurred lasting 4 beats with a max rate of 141 bpm (avg 118 bpm). Isolated SVEs were rare (<1.0%),  SVE Triplets were rare (<1.0%), and no SVE Couplets were present.   Isolated VEs were rare (<1.0%), and no VE Couplets or VE Triplets were present.    Patient triggered event associated with normal sinus __________   2D echo 07/01/2021: 1. Left ventricular ejection fraction, by estimation, is 60 to 65%. The  left ventricle has normal function. The left ventricle has no regional  wall motion abnormalities. There is moderate left ventricular hypertrophy.  Left ventricular diastolic  parameters are consistent with Grade I diastolic dysfunction (impaired  relaxation).   2. Right ventricular systolic function is normal. The right ventricular  size is normal. Tricuspid regurgitation signal is inadequate for assessing  PA pressure.   3. The mitral valve was not well visualized. Trivial mitral valve  regurgitation. No evidence of mitral stenosis.   4. The aortic valve is tricuspid. Aortic valve regurgitation is mild to  moderate.  __________   Carotid artery ultrasound 04/10/2021: IMPRESSION: Bilateral carotid atherosclerosis. No hemodynamically significant ICA stenosis. Degree of narrowing less than 50% bilaterally by ultrasound criteria.   Patent antegrade vertebral flow bilaterally __________   2D echo 04/10/2021: 1. Left ventricular ejection fraction, by estimation, is 55 to 60%. The  left ventricle has normal function. The left  ventricle has no regional  wall motion abnormalities. Left ventricular diastolic parameters are  consistent with Grade I diastolic  dysfunction (impaired relaxation). The global longitudinal strain is  normal.   2. Right ventricular systolic function is normal. The right ventricular  size is normal. Tricuspid regurgitation signal is inadequate  for assessing  PA pressure.   3. Trivial pericardial effusion is present. The pericardial effusion is  circumferential.   4. The mitral valve is normal in structure. No evidence of mitral valve  regurgitation. No evidence of mitral stenosis.   5. The aortic valve is normal in structure. Aortic valve regurgitation is  mild. Mild aortic valve sclerosis is present, with no evidence of aortic  valve stenosis.   6. The inferior vena cava is normal in size with greater than 50%  respiratory variability, suggesting right atrial pressure of 3 mmHg. __________   LHC 08/19/2016: Coronary angiography:  Coronary dominance: Right  Left mainstem:   Large vessel that bifurcates into the LAD and left circumflex, no significant disease noted  Left anterior descending (LAD):   Large vessel that extends to the apical region, diagonal branch 2 of small to moderate size,  30% proximal LAD disease, 40% mid LAD disease.  70% ostial and proximal disease of the D2 and D3 vessel  Left circumflex (LCx):  Large vessel with OM branch 2, no significant disease noted. Small vessel  Right coronary artery (RCA):  Right dominant vessel with PL and PDA, 40 to 50% ostial disease, 30% proximal disease.   Left ventriculography: Left ventricular systolic function is normal, LVEF is estimated at 55-65%, there is no significant mitral regurgitation , no significant aortic valve stenosis  Final Conclusions:   Stable mild to moderate LAD disease Moderate to severe ostial and proximal diagonal disease (unchanged or improved from previous study) Patent RCA disease Mild to Moderate ostial RCA disease, mild proximal RCA disease Normal EF  No intervention indicated at this time  Recommendations:  There was ostial RCA spasm, improved with IC NTG Review of previous cath from 2015 suggests some component of coronary spasm.  If she continues to have chest pain, could increase imdur . Would continue NTG as needed for chest  pain Ranexa  if pain becomes more frequent. __________   Stress echo 01/30/2016: - Stress ECG conclusions: There were no stress arrhythmias or    conduction abnormalities. The stress ECG was normal, although    target heart rate was not achieved.  - Staged echo: Left ventricular ejection fraction was normal at    rest and with stress. EF 55% at rest, improving to 65 to 70% with    stress. Normal echo stress, although target heart rate    suboptimal.   Impressions:   - Normal study after maximal exercise, although target heart rate    was not achieved.  __________   2D echo 02/19/2015: - Left ventricle: The cavity size was normal. Wall thickness was    normal. Systolic function was normal. The estimated ejection    fraction was in the range of 55% to 60%. Wall motion was normal;    there were no regional wall motion abnormalities. Doppler    parameters are consistent with abnormal left ventricular    relaxation (grade 1 diastolic dysfunction).  - Aortic valve: There was mild regurgitation.  - Mitral valve: Calcified annulus.  - Atrial septum: There was increased thickness of the septum,    consistent with lipomatous hypertrophy.   Impressions:   - Normal LV function; grade  1 diastolic dysfunction; mild AI.  __________   Carotid artery ultrasound 02/18/2015: Summary:  Bilateral: intimal wall thickening CCA. Mild calcific plaque origin  ICA. 1-39% ICA stenosis. Vertebral artery flow is antegrade.  __________   For more remote imaging see CV studies   EKG:  EKG is ordered today.  The EKG ordered today demonstrates NSR, 72 bpm, poor R wave progression along the precordial leads, nonspecific ST-T changes  Recent Labs: 10/21/2023: ALT 18; BUN 17; Creatinine, Ser 0.95; Magnesium  1.9; Potassium 4.2; Sodium 141  Recent Lipid Panel    Component Value Date/Time   CHOL 148 10/21/2023 1501   CHOL 192 05/15/2014 0500   TRIG 110 10/21/2023 1501   TRIG 155 05/15/2014 0500   HDL 53  10/21/2023 1501   HDL 51 05/15/2014 0500   CHOLHDL 2.8 10/21/2023 1501   CHOLHDL 3.0 06/20/2022 0937   VLDL 41 (H) 06/20/2022 0937   VLDL 31 05/15/2014 0500   LDLCALC 75 10/21/2023 1501   LDLCALC 110 (H) 05/15/2014 0500   LDLDIRECT 94 09/29/2021 1150    PHYSICAL EXAM:    VS:  BP 114/62   Pulse 72   Ht 5' 1.5 (1.562 m)   Wt 124 lb (56.2 kg)   SpO2 96%   BMI 23.05 kg/m   BMI: Body mass index is 23.05 kg/m.  Physical Exam Vitals reviewed.  Constitutional:      Appearance: She is well-developed.  HENT:     Head: Normocephalic and atraumatic.  Eyes:     General:        Right eye: No discharge.        Left eye: No discharge.  Cardiovascular:     Rate and Rhythm: Normal rate and regular rhythm.     Pulses:          Posterior tibial pulses are 2+ on the right side and 2+ on the left side.     Heart sounds: Normal heart sounds, S1 normal and S2 normal. Heart sounds not distant. No midsystolic click and no opening snap. No murmur heard.    No friction rub.  Pulmonary:     Effort: Pulmonary effort is normal. No respiratory distress.     Breath sounds: Normal breath sounds. No decreased breath sounds, wheezing, rhonchi or rales.  Chest:     Chest wall: No tenderness.  Musculoskeletal:     Cervical back: Normal range of motion.     Right lower leg: No edema.     Left lower leg: No edema.  Skin:    General: Skin is warm and dry.     Nails: There is no clubbing.  Neurological:     Mental Status: She is alert and oriented to person, place, and time.  Psychiatric:        Speech: Speech normal.        Behavior: Behavior normal.        Thought Content: Thought content normal.        Judgment: Judgment normal.     Wt Readings from Last 3 Encounters:  05/19/24 124 lb (56.2 kg)  10/21/23 117 lb 12.8 oz (53.4 kg)  01/13/23 120 lb 12.8 oz (54.8 kg)     ASSESSMENT & PLAN:   CAD involving the native coronary arteries chest pain with moderate risk for cardiac etiology:  Currently without symptoms of angina or cardiac decompensation.  In the setting of recent episode of chest pain, schedule Lexiscan MPI to evaluate for high risk ischemia.  She will  otherwise continue aspirin  81 mg, amlodipine  10 mg, rosuvastatin  40 mg, and Toprol -XL 50 mg.  HFimpEF: She is euvolemic and well compensated, not requiring a standing loop diuretic.  She remains on Toprol -XL 50 mg and lisinopril  5 mg.  In the setting of improved LV systolic function and without heart failure symptoms, defer further escalation of GDMT at this time.  However, should she have recurrence of heart failure symptoms or cardiomyopathy, revisiting evidence-based pharmacotherapy will need to be pursued.  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  No further episodes since.  EP has deferred addition of OAC given brief episode of A-fib in the context of ICH.  Ongoing management per EP.  Recurrent CVA/ICH: No new deficits.  Remains on aspirin  and statin.  Follow-up with PCP and neurology as directed.  HTN: Blood pressure is well-controlled in the office today.  She remains on lisinopril  and Toprol -XL as outlined above.  HLD: LDL 75 in 10/2023.  Remains on rosuvastatin  40 mg.      Disposition: F/u with Dr. Gollan or an APP in 2 months, and EP as directed.    Medication Adjustments/Labs and Tests Ordered: Current medicines are reviewed at length with the patient today.  Concerns regarding medicines are outlined above. Medication changes, Labs and Tests ordered today are summarized above and listed in the Patient Instructions accessible in Encounters.   Signed, Bernardino Bring, PA-C 05/19/2024 5:29 PM     [facility] [place] [address] [place], [place] [address] [phone]

## 2024-05-19 NOTE — Patient Instructions (Signed)
 Medication Instructions:  Your physician recommends that you continue on your current medications as directed. Please refer to the Current Medication list given to you today.   *If you need a refill on your cardiac medications before your next appointment, please call your pharmacy*  Lab Work: No labs ordered today  If you have labs (blood work) drawn today and your tests are completely normal, you will receive your results only by: MyChart Message (if you have MyChart) OR A paper copy in the mail If you have any lab test that is abnormal or we need to change your treatment, we will call you to review the results.  Testing/Procedures: Your provider has ordered a Lexiscan/ Exercise Myoview Stress test. This will take place at [facility]. Please report to the [facility] medical mall entrance. The volunteers at the first desk will direct you where to go.  [facility] MYOVIEW  Your provider has ordered a Stress Test with nuclear imaging. The purpose of this test is to evaluate the blood supply to your heart muscle. This procedure is referred to as a Non-Invasive Stress Test. This is because other than having an IV started in your vein, nothing is inserted or invades your body. Cardiac stress tests are done to find areas of poor blood flow to the heart by determining the extent of coronary artery disease (CAD). Some patients exercise on a treadmill, which naturally increases the blood flow to your heart, while others who are unable to walk on a treadmill due to physical limitations will have a pharmacologic/chemical stress agent called Lexiscan . This medicine will mimic walking on a treadmill by temporarily increasing your coronary blood flow.   Please note: these test may take anywhere between 2-4 hours to complete  How to prepare for your Myoview test:  Nothing to eat for 6 hours prior to the test No caffeine  for 24 hours prior to test No smoking 24 hours prior to test. Your medication may be taken with  water.  If your doctor stopped a medication because of this test, do not take that medication. Ladies, please do not wear dresses.  Skirts or pants are appropriate. Please wear a short sleeve shirt. No perfume, cologne or lotion. Wear comfortable walking shoes. No heels!   PLEASE NOTIFY THE OFFICE AT LEAST 24 HOURS IN ADVANCE IF YOU ARE UNABLE TO KEEP YOUR APPOINTMENT.  [phone] AND  PLEASE NOTIFY NUCLEAR MEDICINE AT [facility] AT LEAST 24 HOURS IN ADVANCE IF YOU ARE UNABLE TO KEEP YOUR APPOINTMENT. [phone]   Follow-Up: At [facility], you and your health needs are our priority.  As part of our continuing mission to provide you with exceptional heart care, our providers are all part of one team.  This team includes your primary Cardiologist (physician) and Advanced Practice Providers or APPs (Physician Assistants and Nurse Practitioners) who all work together to provide you with the care you need, when you need it.  Your next appointment:   2 month(s)  Provider:   You may see Timothy Gollan, MD or one of the following Advanced Practice Providers on your designated Care Team:   Lonni Meager, NP Lesley Maffucci, PA-C Bernardino Bring, PA-C Cadence Cayuse, PA-C Tylene Lunch, NP Barnie Hila, NP    We recommend signing up for the patient portal called MyChart.  Sign up information is provided on this After Visit Summary.  MyChart is used to connect with patients for Virtual Visits (Telemedicine).  Patients are able to view lab/test results, encounter notes, upcoming  appointments, etc.  Non-urgent messages can be sent to your provider as well.   To learn more about what you can do with MyChart, go to [email or web address].

## 2024-05-29 ENCOUNTER — Ambulatory Visit: Payer: Self-pay

## 2024-05-29 DIAGNOSIS — I639 Cerebral infarction, unspecified: Secondary | ICD-10-CM | POA: Diagnosis not present

## 2024-05-30 LAB — CUP PACEART REMOTE DEVICE CHECK
Date Time Interrogation Session: 20250720231542
Implantable Pulse Generator Implant Date: 20221019

## 2024-05-31 ENCOUNTER — Ambulatory Visit: Attending: Cardiology | Admitting: Cardiology

## 2024-05-31 NOTE — Progress Notes (Deleted)
  Electrophysiology Office Follow up Visit Note:    Date:  05/31/2024   ID:  [name], DOB 1955-12-22, MRN [id number]  PCP:  Rudolpho Norleen BIRCH, MD  [facility] Cardiologist:  Evalene Lunger, MD  [facility] Electrophysiologist:  OLE ONEIDA HOLTS, MD    Interval History:     [name] is a 68 y.o. female who presents for a follow up visit.   I last saw the patient in December 2023.  She has a history of stroke and intracranial hemorrhage.  At the last appointment we discussed the pros and cons of anticoagulation and mutually decided to proceed without anticoagulation to avoid risk of recurrent intracranial hemorrhage.        Past medical, surgical, social and family history were reviewed.  ROS:   Please see the history of present illness.    All other systems reviewed and are negative.  EKGs/Labs/Other Studies Reviewed:    The following studies were reviewed today:          Physical Exam:    VS:  There were no vitals taken for this visit.    Wt Readings from Last 3 Encounters:  05/19/24 124 lb (56.2 kg)  10/21/23 117 lb 12.8 oz (53.4 kg)  01/13/23 120 lb 12.8 oz (54.8 kg)     GEN: no distress CARD: RRR, No MRG RESP: No IWOB. CTAB.      ASSESSMENT:    No diagnosis found. PLAN:    In order of problems listed above:     Signed, OLE HOLTS, MD, [facility], [facility] 05/31/2024 3:19 PM    Electrophysiology [facility]

## 2024-06-01 ENCOUNTER — Ambulatory Visit: Payer: Self-pay | Admitting: Cardiology

## 2024-06-12 ENCOUNTER — Other Ambulatory Visit

## 2024-06-29 ENCOUNTER — Ambulatory Visit (INDEPENDENT_AMBULATORY_CARE_PROVIDER_SITE_OTHER): Payer: Self-pay

## 2024-06-29 ENCOUNTER — Ambulatory Visit: Payer: Self-pay | Admitting: Cardiology

## 2024-06-29 DIAGNOSIS — I639 Cerebral infarction, unspecified: Secondary | ICD-10-CM

## 2024-06-29 LAB — CUP PACEART REMOTE DEVICE CHECK
Date Time Interrogation Session: 20250820231049
Implantable Pulse Generator Implant Date: 20221019

## 2024-06-29 NOTE — Progress Notes (Signed)
 [facility] Summary Report / Loop Recorder

## 2024-07-13 ENCOUNTER — Encounter: Admission: RE | Admit: 2024-07-13 | Source: Ambulatory Visit

## 2024-07-20 ENCOUNTER — Ambulatory Visit: Payer: Self-pay | Admitting: Physician Assistant

## 2024-07-20 ENCOUNTER — Encounter
Admission: RE | Admit: 2024-07-20 | Discharge: 2024-07-20 | Disposition: A | Discharge status: Home / Self Care | Source: Ambulatory Visit | Attending: Physician Assistant | Admitting: Physician Assistant

## 2024-07-20 DIAGNOSIS — R079 Chest pain, unspecified: Secondary | ICD-10-CM | POA: Diagnosis present

## 2024-07-20 LAB — NM MYOCAR MULTI W/SPECT W/WALL MOTION / EF
Estimated workload: 1
Exercise duration (min): 0 min
Exercise duration (sec): 0 s
LV dias vol: 88 mL (ref 46–106)
LV sys vol: 26 mL (ref 3.8–5.2)
MPHR: 153 {beats}/min
Nuc Stress EF: 70 %
Peak HR: 89 {beats}/min
Percent HR: 58 %
Rest HR: 64 {beats}/min
Rest Nuclear Isotope Dose: 10.1 mCi
SDS: 0
SRS: 10
SSS: 0
ST Depression (mm): 0 mm
Stress Nuclear Isotope Dose: 31.8 mCi
TID: 1.05

## 2024-07-20 MED ORDER — TECHNETIUM TC 99M TETROFOSMIN IV KIT
10.0700 | PACK | Freq: Once | INTRAVENOUS | Status: AC | PRN
  Administered 2024-07-20: 10.07 via INTRAVENOUS

## 2024-07-20 MED ORDER — TECHNETIUM TC 99M TETROFOSMIN IV KIT
31.7700 | PACK | Freq: Once | INTRAVENOUS | Status: AC | PRN
  Administered 2024-07-20: 31.77 via INTRAVENOUS

## 2024-07-20 MED ORDER — REGADENOSON 0.4 MG/5ML IV SOLN
0.4000 mg | Freq: Once | INTRAVENOUS | Status: AC
  Administered 2024-07-20: 0.4 mg via INTRAVENOUS

## 2024-07-31 ENCOUNTER — Ambulatory Visit (INDEPENDENT_AMBULATORY_CARE_PROVIDER_SITE_OTHER): Payer: Self-pay

## 2024-07-31 DIAGNOSIS — I25118 Atherosclerotic heart disease of native coronary artery with other forms of angina pectoris: Secondary | ICD-10-CM

## 2024-07-31 LAB — CUP PACEART REMOTE DEVICE CHECK
Date Time Interrogation Session: 20250921231054
Implantable Pulse Generator Implant Date: 20221019

## 2024-08-01 NOTE — Progress Notes (Signed)
 Remote Loop Recorder Transmission

## 2024-08-02 ENCOUNTER — Ambulatory Visit: Payer: Self-pay | Admitting: Cardiology

## 2024-08-02 NOTE — Progress Notes (Unsigned)
 Cardiology Office Note    Date:  08/03/2024   ID:  Karsen, Fellows December 27, 1955, MRN [id number]  PCP:  Rudolpho Norleen BIRCH, MD  Cardiologist:  Evalene Lunger, MD  Electrophysiologist:  OLE ONEIDA HOLTS, MD   Chief Complaint: Follow-up  History of Present Illness:   [name] is a 68 y.o. female with history of CAD with NSTEMI in 03/2013 medically managed with prior stenting to the distal RCA in 12/2013, HFimpEF secondary to ICM, recurrent CVAs (08/2013, 06/2021, 07/2021, 06/2022), lone Afib noted on ILR in 08/2022, ICH in 04/2021, HTN, HLD, migraine disorder, tobacco use, depression, and GERD who presents for follow-up of Lexiscan  MPI.  She was admitted to the hospital in 03/2013 with an NSTEMI.  Cath demonstrated two-vessel CAD with an occluded mid RCA with collaterals from the distal LAD.  Medical management was advised.  Remaining cath details included 75% stenosis in D1 near the ostium, 40% mid LAD, 30% distal LAD, 50% D2 disease, 30% proximal circumflex, 75% proximal RCA disease.  Repeat cath in 12/2013 showed 95% distal RCA stenosis status post PCI/DES as well as 40% mid LAD, 30% distal LAD, 80% D1 disease, 60% D2 disease, 50% ostial RCA disease, 50% proximal RCA disease, 60% mid RCA disease.  Most recent cath from 08/2016 showed left LAD 30% proximal stenosis, 40% mid LAD disease, 70% ostial and proximal D2 and D3 disease, RCA 40 to 50% ostial disease, 30% proximal RCA disease. LVSF normal.  Given stability of CAD, medical management was advised.   She was admitted to [facility] in 08/2013 with a CVA.  Surface echo at that time showed an EF of 60 to 65%, no regional wall motion abnormalities, and mild mitral regurgitation.  Carotid artery ultrasound showed bilateral 1 to 39% ICA stenosis with antegrade vertebral artery flow.   She was admitted in 04/2021 with right thalamic hemorrhagic CVA with right to left midline shift felt to be in the setting of hypertensive emergency.  Echo showed an EF of 55  to 60%, no regional wall motion normalities, grade 1 diastolic dysfunction, normal RV systolic function and ventricular cavity size, trivial circumferential pericardial effusion, mild aortic valve insufficiency, mild aortic valve sclerosis without evidence of stenosis, and an estimated right atrial pressure of 3 mmHg.  Carotid artery ultrasound showed less than 50% bilateral ICA stenosis.   She was admitted in 06/2021 with a small acute left cerebral infarct and a subacute to chronic right corona radiata lacunar infarct.  Echo showed an EF of 60 to 65%, no regional wall motion abnormalities, moderate LVH, grade 1 diastolic dysfunction, normal RV systolic function and ventricular cavity size, trivial mitral regurgitation, and mild to moderate aortic insufficiency.  Given recent ICH in 04/2021, neurology recommended single antiplatelet therapy.  Outpatient cardiac monitoring demonstrated a predominant rhythm of sinus with an average heart rate of 59 (range 43-1 41), 1 run of NSVT lasting 4 beats, rare PACs and PVCs, and no significant sustained arrhythmias.  Patient triggered events were associated with sinus rhythm.   She was admitted in 07/2021 with small acute to subacute infarct in the left temporal lobe that was new compared to study in 06/2021.  No acute intracranial hemorrhage.  She was deemed to be outside of tPA window.  Given history of bilateral strokes, she underwent TEE which was negative for intracardiac shunt.  Neurology recommended discharging the patient on aspirin , Plavix , and statin.   She underwent ILR implantation in 08/2021.   Echo in 03/2022 showed  an EF of 60 to 65%, no regional wall motion abnormalities, grade 1 diastolic dysfunction, normal RV systolic function and ventricular cavity size, mild aortic insufficiency, and an estimated right atrial pressure of 3 mmHg.   She was admitted in 06/2022 with cranial hemorrhage felt to be hypertensive in etiology.  Echo during that admission showed  an EF of 65 to 70%, no regional wall motion abnormalities, mild concentric LVH, grade 1 diastolic dysfunction, normal RV systolic function and ventricular cavity size, mild aortic insufficiency, aortic valve sclerosis without evidence of stenosis, and an estimated right atrial pressure of 3 mmHg.   She was subsequently found to have documented recurrence of A-fib in 08/2022 (first documented occurrence) with a 2-minute episode detected on 08/14/2022.  She was evaluated EP on 10/14/2022 and was not taking antiplatelet or anticoagulant at that time.  Given her history of ICH, and an episode of A-fib that lasted 2 minutes, the risks of anticoagulation were felt to outweigh any potential benefit with recommendation to continue to monitor ILR to guide any future decision regarding anticoagulation.  Subsequent loop monitor interrogations have shown no further episodes of A-fib, up to interrogation dated up to 07/2024.     She was last seen in the office in 05/2024 and reported an episode of left-sided chest discomfort that began at rest approximately 1 month prior and persisted throughout the night without associated symptoms.  Upon waking up the next day chest pain resolved and she had remained chest pain-free since.  Symptoms did not feel similar to her angina leading up to NSTEMI.  Lexiscan  MPI on 07/20/2024 showed no significant ischemia with an EF of 70% and was overall low risk.  CT attenuation corrected images demonstrated coronary calcification in the LAD and RCA with prior PCI to the RCA.  She comes in accompanied by family today and is doing well from a cardiac perspective.  She has been without further symptoms of chest pain.  No dyspnea, palpitations, dizziness, presyncope, or syncope.  No falls or symptoms concerning for bleeding.  No new focal neurological deficits.  No longer on metoprolol  for relative hypotension.  Remains on amlodipine , lisinopril , aspirin , and rosuvastatin .  Was recently advised that  she has been selected for jury duty and has significant concerns regarding her ability to complete this duty.  Otherwise, she does not have any acute cardiac concerns at this time.   Labs independently reviewed: 01/2024 - potassium 4.2, BUN 16, serum creatinine 0.7, Hgb 12.8, PLT 272 10/2023 - albumin 4.4, AST/ALT normal, TC 148, TG 110, HDL 53, LDL 75, magnesium  1.9 06/2022 - A1c 6.0 07/2021 - TSH normal  Past Medical History:  Diagnosis Date   Anxiety    Arthritis    CAD (coronary artery disease)    a. 03/2013 NSTEMI/Cath: 100 RCA, EF 45%->Med Rx;  b. 12/2013 Cath/PCI: LAD 81m, 30d, D1 80, D2 60, LCX nl, RCA 50ost/p, 32m, 95d (2.5x33 Xience DES);  c. 05/2014 Lexi MV: EF 68%, no ischemia; d. stress echo 12/2015 no ischemia @ max exercise (did not achieve target)   Carotid disease, bilateral    a. 08/2013 Carotid U/S: 1-39% bilat ICA stenosis.   Chronic kidney disease    Chronic kidney infections. Takes daily preventative.   Complication of anesthesia    CVA (cerebral vascular accident) (HCC)    a. 08/2013.   CVA (cerebral vascular accident) (HCC)    Decreased libido    Depression    GERD (gastroesophageal reflux disease)    History  of 2019 novel coronavirus disease (COVID-19) 07/2019   History of recurrent UTIs    Hypercholesteremia    Hyperlipemia    Hypertension    Insomnia    Ischemic cardiomyopathy    a. 03/2013 Echo: EF 30-35%, mod dil LA, mod-sev MR, mod TR;  b. 08/2013 Echo EF 60-65%, mild AI.   Menopause    Migraines    Myocardial infarction (HCC)    PONV (postoperative nausea and vomiting)    If anesthsia is given slowly, pt will not throw up, if given quickly, pt will have nausea and vomiting.   Right lower quadrant pain    Syncope and collapse    Tobacco abuse    Vaginal atrophy     Past Surgical History:  Procedure Laterality Date   CARDIAC CATHETERIZATION  01/01/2014   CARDIAC CATHETERIZATION  03/16/2013   CARDIAC CATHETERIZATION  12/2013   CARDIAC  CATHETERIZATION N/A 08/19/2016   Procedure: Left Heart Cath and Coronary Angiography;  Surgeon: Evalene JINNY Lunger, MD;  Location: [facility] INVASIVE CV [facility];  Service: Cardiovascular;  Laterality: N/A;   CESAREAN SECTION WITH BILATERAL TUBAL LIGATION     CORONARY ANGIOPLASTY WITH STENT PLACEMENT  01/01/2014   95% lesion with a drug eluting stent to the distal RCA.   ENDOMETRIAL ABLATION     KNEE ARTHROSCOPY  11/27/2006   left knee    OOPHORECTOMY     TEE WITHOUT CARDIOVERSION N/A 07/25/2021   Procedure: TRANSESOPHAGEAL ECHOCARDIOGRAM (TEE);  Surgeon: Lunger Evalene JINNY, MD;  Location: [facility] ORS;  Service: Cardiovascular;  Laterality: N/A;   TOTAL KNEE ARTHROPLASTY Left 10/06/2016   Procedure: TOTAL KNEE ARTHROPLASTY;  Surgeon: Norleen JINNY Maltos, MD;  Location: [facility] ORS;  Service: Orthopedics;  Laterality: Left;   TOTAL KNEE ARTHROPLASTY Right 11/12/2020   Procedure: TOTAL KNEE ARTHROPLASTY;  Surgeon: Maltos Norleen JINNY, MD;  Location: [facility] ORS;  Service: Orthopedics;  Laterality: Right;   TUBAL LIGATION      Current Medications: Current Meds  Medication Sig   amLODipine  (NORVASC ) 10 MG tablet Take 1 tablet (10 mg total) by mouth daily.   aspirin  EC 81 MG EC tablet Take 1 tablet (81 mg total) by mouth daily. Swallow whole.   lisinopril  (ZESTRIL ) 5 MG tablet Take 1 tablet (5 mg total) by mouth daily.   meclizine  (ANTIVERT ) 25 MG tablet Take 1 tablet (25 mg total) by mouth 3 (three) times daily as needed for dizziness.   pantoprazole  (PROTONIX ) 40 MG tablet Take 40 mg by mouth 2 (two) times daily.   rosuvastatin  (CRESTOR ) 40 MG tablet Take 1 tablet (40 mg total) by mouth daily.    Allergies:   Codeine sulfate and Penicillins   Social History   Socioeconomic History   Marital status: Single    Spouse name: Not on file   Number of children: 1   Years of education: Not on file   Highest education level: Not on file  Occupational History   Occupation: lab tech    Employer: [employer]   Tobacco Use   Smoking status: Former    Current packs/day: 0.00    Average packs/day: 1.5 packs/day for 20.0 years (30.0 ttl pk-yrs)    Types: Cigarettes    Start date: 04/07/1993    Quit date: 04/07/2013    Years since quitting: 11.3   Smokeless tobacco: Never  Vaping Use   Vaping status: Never Used  Substance and Sexual Activity   Alcohol  use: Not Currently    Comment: 1 glass of  wine a month   Drug use: No   Sexual activity: Not Currently    Birth control/protection: None  Other Topics Concern   Not on file  Social History Narrative   Lives alone, not compliant w/ med regimen   Social Drivers of Health   Financial Resource Strain: Low Risk  (11/11/2023)   Received [facility]   Overall Financial Resource Strain (CARDIA)    Difficulty of Paying Living Expenses: Not very hard  Food Insecurity: Food Insecurity Present (11/11/2023)   Received from [facility]   Hunger Vital Sign    Within the past 12 months, you worried that your food would run out before you got the money to buy more.: Sometimes true    Within the past 12 months, the food you bought just didn't last and you didn't have money to get more.: Sometimes true  Transportation Needs: No Transportation Needs (11/11/2023)   Received from [facility] - Transportation    In the past 12 months, has lack of transportation kept you from medical appointments or from getting medications?: No    Lack of Transportation (Non-Medical): No  Physical Activity: Inactive (02/22/2018)   Exercise Vital Sign    Days of Exercise per Week: 0 days    Minutes of Exercise per Session: 0 min  Stress: Not on file  Social Connections: Not on file     Family History:  The patient's family history includes Breast cancer in her paternal grandmother; Hyperlipidemia in her mother; Hypertension in her mother, sister, and sister; Stroke in her mother. There is no history of Colon  cancer, Ovarian cancer, Heart disease, or Diabetes.  ROS:   12-point review of systems is negative unless otherwise noted in the HPI.   EKGs/Labs/Other Studies Reviewed:    Studies reviewed were summarized above. The additional studies were reviewed today:  Lexiscan  MPI 07/20/2024: Pharmacological myocardial perfusion imaging study with no significant  ischemia Normal wall motion, EF estimated at 70% No EKG changes concerning for ischemia at peak stress or in recovery. CT attenuation correction images with coronary calcification in the LAD and RCA, prior PCI to the RCA Low risk scan __________  2D echo 06/19/2022: 1. Left ventricular ejection fraction, by estimation, is 65 to 70%. The  left ventricle has normal function. The left ventricle has no regional  wall motion abnormalities. There is mild concentric left ventricular  hypertrophy. Left ventricular diastolic  parameters are consistent with Grade I diastolic dysfunction (impaired  relaxation).   2. Right ventricular systolic function is normal. The right ventricular  size is normal. Tricuspid regurgitation signal is inadequate for assessing  PA pressure.   3. The mitral valve is normal in structure. No evidence of mitral valve  regurgitation.   4. The aortic valve is tricuspid. There is mild calcification of the  aortic valve. There is mild thickening of the aortic valve. Aortic valve  regurgitation is mild. Aortic valve sclerosis/calcification is present,  without any evidence of aortic  stenosis.   5. The inferior vena cava is normal in size with greater than 50%  respiratory variability, suggesting right atrial pressure of 3 mmHg.  __________   2D echo 03/12/2022: 1. Left ventricular ejection fraction, by estimation, is 60 to 65%. Left  ventricular ejection fraction by 2D MOD biplane is 68.7 %. The left  ventricle has normal function. The left ventricle has no regional wall  motion abnormalities. Left ventricular   diastolic  parameters are consistent with Grade I diastolic dysfunction  (impaired relaxation). The average left ventricular global longitudinal  strain is -20.9 %. The global longitudinal strain is normal.   2. Right ventricular systolic function is normal. The right ventricular  size is normal.   3. The mitral valve is normal in structure. No evidence of mitral valve  regurgitation.   4. The aortic valve is tricuspid. Aortic valve regurgitation is mild.   5. The inferior vena cava is normal in size with greater than 50%  respiratory variability, suggesting right atrial pressure of 3 mmHg.   Comparison(s): LVEF 60-65%.  __________   Zio patch 07/2021 Patient had a min HR of 45 bpm, max HR of 145 bpm, and avg HR of 62 bpm.  Predominant underlying rhythm was Sinus Rhythm.    4 Supraventricular Tachycardia runs occurred, the run with the fastest interval lasting 4 beats with a max rate of 145 bpm, the longest lasting 4 beats with an avg rate of 96 bpm.   Isolated SVEs were rare (<1.0%), SVE Couplets were rare (<1.0%), and SVE Triplets were rare (<1.0%).  Isolated VEs were rare (<1.0%), and no VE Couplets or VE Triplets were present.    Patient triggered event not associated with significant arrhythmia __________   TEE 07/25/2021: 1. Left ventricular ejection fraction, by estimation, is 60 to 65%. The  left ventricle has normal function. The left ventricle has no regional  wall motion abnormalities.   2. Right ventricular systolic function is normal. The right ventricular  size is normal.   3. No left atrial/left atrial appendage thrombus was detected.   4. The mitral valve is normal in structure. Trivial mitral valve  regurgitation. No evidence of mitral stenosis.   5. The aortic valve is normal in structure. Aortic valve regurgitation is  mild to moderate. No aortic stenosis is present.   6. There is mild (Grade II) atheroma plaque.   7. The inferior vena cava is normal in size  with greater than 50%  respiratory variability, suggesting right atrial pressure of 3 mmHg.   8. Agitated saline contrast bubble study was negative, with no evidence  of any interatrial shunt.   Conclusion(s)/Recommendation(s): Normal biventricular function without  evidence of hemodynamically significant valvular heart disease.  __________   Zio patch 06/2021: Patch Wear Time:  14 days and 0 hours (2022-08-24T09:32:15-0400 to 2022-09-07T09:32:15-0400)   Normal sinus rhythm Patient had a min HR of 43 bpm, max HR of 141 bpm, and avg HR of 59 bpm.    1 run of Ventricular Tachycardia occurred lasting 4 beats with a max rate of 141 bpm (avg 118 bpm). Isolated SVEs were rare (<1.0%),  SVE Triplets were rare (<1.0%), and no SVE Couplets were present.   Isolated VEs were rare (<1.0%), and no VE Couplets or VE Triplets were present.    Patient triggered event associated with normal sinus __________   2D echo 07/01/2021: 1. Left ventricular ejection fraction, by estimation, is 60 to 65%. The  left ventricle has normal function. The left ventricle has no regional  wall motion abnormalities. There is moderate left ventricular hypertrophy.  Left ventricular diastolic  parameters are consistent with Grade I diastolic dysfunction (impaired  relaxation).   2. Right ventricular systolic function is normal. The right ventricular  size is normal. Tricuspid regurgitation signal is inadequate for assessing  PA pressure.   3. The mitral valve was not well visualized. Trivial mitral valve  regurgitation. No evidence of mitral stenosis.  4. The aortic valve is tricuspid. Aortic valve regurgitation is mild to  moderate.  __________   Carotid artery ultrasound 04/10/2021: IMPRESSION: Bilateral carotid atherosclerosis. No hemodynamically significant ICA stenosis. Degree of narrowing less than 50% bilaterally by ultrasound criteria.   Patent antegrade vertebral flow bilaterally __________   2D echo  04/10/2021: 1. Left ventricular ejection fraction, by estimation, is 55 to 60%. The  left ventricle has normal function. The left ventricle has no regional  wall motion abnormalities. Left ventricular diastolic parameters are  consistent with Grade I diastolic  dysfunction (impaired relaxation). The global longitudinal strain is  normal.   2. Right ventricular systolic function is normal. The right ventricular  size is normal. Tricuspid regurgitation signal is inadequate for assessing  PA pressure.   3. Trivial pericardial effusion is present. The pericardial effusion is  circumferential.   4. The mitral valve is normal in structure. No evidence of mitral valve  regurgitation. No evidence of mitral stenosis.   5. The aortic valve is normal in structure. Aortic valve regurgitation is  mild. Mild aortic valve sclerosis is present, with no evidence of aortic  valve stenosis.   6. The inferior vena cava is normal in size with greater than 50%  respiratory variability, suggesting right atrial pressure of 3 mmHg. __________   LHC 08/19/2016: Coronary angiography:  Coronary dominance: Right  Left mainstem:   Large vessel that bifurcates into the LAD and left circumflex, no significant disease noted  Left anterior descending (LAD):   Large vessel that extends to the apical region, diagonal branch 2 of small to moderate size,  30% proximal LAD disease, 40% mid LAD disease.  70% ostial and proximal disease of the D2 and D3 vessel  Left circumflex (LCx):  Large vessel with OM branch 2, no significant disease noted. Small vessel  Right coronary artery (RCA):  Right dominant vessel with PL and PDA, 40 to 50% ostial disease, 30% proximal disease.   Left ventriculography: Left ventricular systolic function is normal, LVEF is estimated at 55-65%, there is no significant mitral regurgitation , no significant aortic valve stenosis  Final Conclusions:   Stable mild to moderate LAD disease Moderate  to severe ostial and proximal diagonal disease (unchanged or improved from previous study) Patent RCA disease Mild to Moderate ostial RCA disease, mild proximal RCA disease Normal EF  No intervention indicated at this time  Recommendations:  There was ostial RCA spasm, improved with IC NTG Review of previous cath from 2015 suggests some component of coronary spasm.  If she continues to have chest pain, could increase imdur . Would continue NTG as needed for chest pain Ranexa  if pain becomes more frequent. __________   Stress echo 01/30/2016: - Stress ECG conclusions: There were no stress arrhythmias or    conduction abnormalities. The stress ECG was normal, although    target heart rate was not achieved.  - Staged echo: Left ventricular ejection fraction was normal at    rest and with stress. EF 55% at rest, improving to 65 to 70% with    stress. Normal echo stress, although target heart rate    suboptimal.   Impressions:   - Normal study after maximal exercise, although target heart rate    was not achieved.  __________   2D echo 02/19/2015: - Left ventricle: The cavity size was normal. Wall thickness was    normal. Systolic function was normal. The estimated ejection    fraction was in the range of 55% to 60%. Wall  motion was normal;    there were no regional wall motion abnormalities. Doppler    parameters are consistent with abnormal left ventricular    relaxation (grade 1 diastolic dysfunction).  - Aortic valve: There was mild regurgitation.  - Mitral valve: Calcified annulus.  - Atrial septum: There was increased thickness of the septum,    consistent with lipomatous hypertrophy.   Impressions:   - Normal LV function; grade 1 diastolic dysfunction; mild AI.  __________   Carotid artery ultrasound 02/18/2015: Summary:  Bilateral: intimal wall thickening CCA. Mild calcific plaque origin  ICA. 1-39% ICA stenosis. Vertebral artery flow is antegrade.  __________    For more remote imaging see CV studies   EKG:  EKG is ordered today.  The EKG ordered today demonstrates NSR with sinus arrhythmia, 71 bpm, poor R wave progression along the precordial leads, baseline wandering, nonspecific ST-T changes  Recent Labs: 10/21/2023: ALT 18; BUN 17; Creatinine, Ser 0.95; Magnesium  1.9; Potassium 4.2; Sodium 141  Recent Lipid Panel    Component Value Date/Time   CHOL 148 10/21/2023 1501   CHOL 192 05/15/2014 0500   TRIG 110 10/21/2023 1501   TRIG 155 05/15/2014 0500   HDL 53 10/21/2023 1501   HDL 51 05/15/2014 0500   CHOLHDL 2.8 10/21/2023 1501   CHOLHDL 3.0 06/20/2022 0937   VLDL 41 (H) 06/20/2022 0937   VLDL 31 05/15/2014 0500   LDLCALC 75 10/21/2023 1501   LDLCALC 110 (H) 05/15/2014 0500   LDLDIRECT 94 09/29/2021 1150    PHYSICAL EXAM:    VS:  BP 118/68 (BP Location: Left Arm, Patient Position: Sitting, Cuff Size: Normal)   Pulse 71   Wt 121 lb (54.9 kg)   SpO2 98%   BMI 22.49 kg/m   BMI: Body mass index is 22.49 kg/m.  Physical Exam Vitals reviewed.  Constitutional:      Appearance: She is well-developed.  HENT:     Head: Normocephalic and atraumatic.  Eyes:     General:        Right eye: No discharge.        Left eye: No discharge.  Cardiovascular:     Rate and Rhythm: Normal rate and regular rhythm.     Heart sounds: Normal heart sounds, S1 normal and S2 normal. Heart sounds not distant. No midsystolic click and no opening snap. No murmur heard.    No friction rub.  Pulmonary:     Effort: Pulmonary effort is normal. No respiratory distress.     Breath sounds: Normal breath sounds. No decreased breath sounds, wheezing, rhonchi or rales.  Musculoskeletal:     Cervical back: Normal range of motion.     Right lower leg: No edema.     Left lower leg: No edema.  Skin:    General: Skin is warm and dry.     Nails: There is no clubbing.  Neurological:     Mental Status: She is alert and oriented to person, place, and time.      Comments: Dysarthria noted.  Psychiatric:        Speech: Speech normal.        Behavior: Behavior normal.        Thought Content: Thought content normal.        Judgment: Judgment normal.     Wt Readings from Last 3 Encounters:  08/03/24 121 lb (54.9 kg)  05/19/24 124 lb (56.2 kg)  10/21/23 117 lb 12.8 oz (53.4 kg)     ASSESSMENT &  PLAN:   CAD involving the native coronary arteries without angina: She is doing well and without symptoms concerning for angina or cardiac decompensation.  Lexiscan  MPI earlier this month showed no evidence of ischemia or scar.  Continue aggressive risk factor modification and secondary prevention including aspirin  81 mg and rosuvastatin  40 mg.  No indication for further ischemic testing at this time.  HFimpEF: Euvolemic and well compensated not requiring standing loop diuretic.  No longer on Toprol -XL with relative hypotension.  Remains on lisinopril  5 mg.  In the setting of improved LV systolic function and without heart failure symptoms, defer escalation of GDMT at this time.  However, should she have recurrence of heart failure symptoms or cardiomyopathy, would need to revisit evidence-based pharmacotherapy.  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 No further episodes since. EP has deferred addition of OAC given brief episode of A-fib in the context of ICH. Ongoing management per EP.   Recurrent CVA/ICH: No new neurological deficits.  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  Remains on aspirin  81 mg and rosuvastatin  40 mg.  Note provided for jury duty.  Ongoing management per PCP and neurology.   HTN: Blood pressure is well controlled in the office today.  She remains on amlodipine  10 mg, lisinopril  5 mg, and Toprol  XL 50 mg.   HLD: LDL 75 in 10/2023 with normal AST/ALT  at that time.  Target LDL at least less than 70.  Remains on rosuvastatin  40 mg.  Anticipate follow-up fasting labs at next visit if not obtained in the meantime.     Disposition: F/u with Dr. Gollan or an APP in 6 months, and EP as directed.    Medication Adjustments/Labs and Tests Ordered: Current medicines are reviewed at length with the patient today.  Concerns regarding medicines are outlined above. Medication changes, Labs and Tests ordered today are summarized above and listed in the Patient Instructions accessible in Encounters.   Signed, Bernardino Bring, PA-C 08/03/2024 3:41 PM     [facility] [address] [place], [place] [address] [phone]

## 2024-08-03 ENCOUNTER — Encounter: Payer: Self-pay | Admitting: Physician Assistant

## 2024-08-03 ENCOUNTER — Ambulatory Visit: Attending: Physician Assistant | Admitting: Physician Assistant

## 2024-08-03 VITALS — BP 118/68 | HR 71 | Wt 121.0 lb

## 2024-08-03 DIAGNOSIS — I5032 Chronic diastolic (congestive) heart failure: Secondary | ICD-10-CM

## 2024-08-03 DIAGNOSIS — I10 Essential (primary) hypertension: Secondary | ICD-10-CM

## 2024-08-03 DIAGNOSIS — I639 Cerebral infarction, unspecified: Secondary | ICD-10-CM | POA: Diagnosis not present

## 2024-08-03 DIAGNOSIS — E785 Hyperlipidemia, unspecified: Secondary | ICD-10-CM

## 2024-08-03 DIAGNOSIS — I4891 Unspecified atrial fibrillation: Secondary | ICD-10-CM | POA: Diagnosis not present

## 2024-08-03 DIAGNOSIS — I2510 Atherosclerotic heart disease of native coronary artery without angina pectoris: Secondary | ICD-10-CM | POA: Diagnosis not present

## 2024-08-03 NOTE — Patient Instructions (Signed)
 Medication Instructions:  Your physician recommends that you continue on your current medications as directed. Please refer to the Current Medication list given to you today.   *If you need a refill on your cardiac medications before your next appointment, please call your pharmacy*  Lab Work: None ordered at this time   Follow-Up: At [facility], you and your health needs are our priority.  As part of our continuing mission to provide you with exceptional heart care, our providers are all part of one team.  This team includes your primary Cardiologist (physician) and Advanced Practice Providers or APPs (Physician Assistants and Nurse Practitioners) who all work together to provide you with the care you need, when you need it.  Your next appointment:   6 month(s)  Provider:   You may see Timothy Gollan, MD or Bernardino Bring, PA-C  We recommend signing up for the patient portal called MyChart.  Sign up information is provided on this After Visit Summary.  MyChart is used to connect with patients for Virtual Visits (Telemedicine).  Patients are able to view lab/test results, encounter notes, upcoming appointments, etc.  Non-urgent messages can be sent to your provider as well.   To learn more about what you can do with MyChart, go to [email or web address].

## 2024-08-14 NOTE — Progress Notes (Signed)
 Remote Loop Recorder Transmission

## 2024-08-31 ENCOUNTER — Other Ambulatory Visit: Payer: Self-pay | Admitting: Internal Medicine

## 2024-08-31 ENCOUNTER — Ambulatory Visit

## 2024-08-31 DIAGNOSIS — I2510 Atherosclerotic heart disease of native coronary artery without angina pectoris: Secondary | ICD-10-CM

## 2024-08-31 DIAGNOSIS — Z1231 Encounter for screening mammogram for malignant neoplasm of breast: Secondary | ICD-10-CM

## 2024-08-31 LAB — CUP PACEART REMOTE DEVICE CHECK
Date Time Interrogation Session: 20251022231116
Implantable Pulse Generator Implant Date: 20221019

## 2024-09-02 NOTE — Progress Notes (Signed)
 Remote Loop Recorder Transmission

## 2024-09-04 ENCOUNTER — Ambulatory Visit: Payer: Self-pay | Admitting: Cardiology

## 2024-09-19 LAB — COLOGUARD: COLOGUARD: POSITIVE — AB

## 2024-10-01 ENCOUNTER — Ambulatory Visit

## 2024-10-04 ENCOUNTER — Ambulatory Visit

## 2024-10-04 DIAGNOSIS — I5020 Unspecified systolic (congestive) heart failure: Secondary | ICD-10-CM | POA: Diagnosis not present

## 2024-10-04 LAB — CUP PACEART REMOTE DEVICE CHECK
Date Time Interrogation Session: 20251125231029
Implantable Pulse Generator Implant Date: 20221019

## 2024-10-06 NOTE — Progress Notes (Signed)
 Remote Loop Recorder Transmission

## 2024-10-09 ENCOUNTER — Ambulatory Visit: Payer: Self-pay | Admitting: Cardiology

## 2024-10-12 ENCOUNTER — Ambulatory Visit
Admission: RE | Admit: 2024-10-12 | Discharge: 2024-10-12 | Disposition: A | Source: Ambulatory Visit | Attending: Internal Medicine

## 2024-10-12 DIAGNOSIS — Z1231 Encounter for screening mammogram for malignant neoplasm of breast: Secondary | ICD-10-CM | POA: Insufficient documentation

## 2024-11-04 ENCOUNTER — Ambulatory Visit: Attending: Cardiology

## 2024-11-04 DIAGNOSIS — I2510 Atherosclerotic heart disease of native coronary artery without angina pectoris: Secondary | ICD-10-CM | POA: Diagnosis not present

## 2024-11-06 LAB — CUP PACEART REMOTE DEVICE CHECK
Date Time Interrogation Session: 20251226230524
Implantable Pulse Generator Implant Date: 20221019

## 2024-11-12 ENCOUNTER — Ambulatory Visit: Payer: Self-pay | Admitting: Cardiology

## 2024-11-13 NOTE — Progress Notes (Signed)
 Remote Loop Recorder Transmission

## 2024-11-23 ENCOUNTER — Encounter: Payer: Self-pay | Admitting: Gastroenterology

## 2024-11-23 ENCOUNTER — Ambulatory Visit

## 2024-11-23 ENCOUNTER — Other Ambulatory Visit: Payer: Self-pay

## 2024-11-23 ENCOUNTER — Encounter: Admission: RE | Disposition: A | Payer: Self-pay | Source: Home / Self Care | Attending: Gastroenterology

## 2024-11-23 ENCOUNTER — Ambulatory Visit
Admission: RE | Admit: 2024-11-23 | Discharge: 2024-11-23 | Disposition: A | Discharge status: Home / Self Care | Attending: Gastroenterology | Admitting: Gastroenterology

## 2024-11-23 DIAGNOSIS — I129 Hypertensive chronic kidney disease with stage 1 through stage 4 chronic kidney disease, or unspecified chronic kidney disease: Secondary | ICD-10-CM | POA: Diagnosis not present

## 2024-11-23 DIAGNOSIS — D125 Benign neoplasm of sigmoid colon: Secondary | ICD-10-CM | POA: Insufficient documentation

## 2024-11-23 DIAGNOSIS — N189 Chronic kidney disease, unspecified: Secondary | ICD-10-CM | POA: Diagnosis not present

## 2024-11-23 DIAGNOSIS — R195 Other fecal abnormalities: Secondary | ICD-10-CM | POA: Insufficient documentation

## 2024-11-23 DIAGNOSIS — D122 Benign neoplasm of ascending colon: Secondary | ICD-10-CM | POA: Insufficient documentation

## 2024-11-23 DIAGNOSIS — Z87891 Personal history of nicotine dependence: Secondary | ICD-10-CM | POA: Insufficient documentation

## 2024-11-23 DIAGNOSIS — Z1211 Encounter for screening for malignant neoplasm of colon: Secondary | ICD-10-CM | POA: Diagnosis present

## 2024-11-23 HISTORY — PX: COLONOSCOPY: SHX5424

## 2024-11-23 HISTORY — PX: POLYPECTOMY: SHX149

## 2024-11-23 HISTORY — PX: HEMOSTASIS CLIP PLACEMENT: SHX6857

## 2024-11-23 MED ORDER — SODIUM CHLORIDE 0.9 % IV SOLN: INTRAVENOUS | Status: DC

## 2024-11-23 MED ORDER — PHENYLEPHRINE 80 MCG/ML (10ML) SYRINGE FOR IV PUSH (FOR BLOOD PRESSURE SUPPORT)
PREFILLED_SYRINGE | INTRAVENOUS | Status: AC
  Filled 2024-11-23: qty 10

## 2024-11-23 MED ORDER — PHENYLEPHRINE 80 MCG/ML (10ML) SYRINGE FOR IV PUSH (FOR BLOOD PRESSURE SUPPORT)
PREFILLED_SYRINGE | INTRAVENOUS | Status: DC | PRN
  Administered 2024-11-23: 40 ug via INTRAVENOUS

## 2024-11-23 MED ORDER — PROPOFOL 500 MG/50ML IV EMUL
INTRAVENOUS | Status: DC | PRN
  Administered 2024-11-23: 80 mg via INTRAVENOUS
  Administered 2024-11-23: 160 ug/kg/min via INTRAVENOUS

## 2024-11-23 MED ORDER — EPHEDRINE SULFATE-NACL 50-0.9 MG/10ML-% IV SOSY
PREFILLED_SYRINGE | INTRAVENOUS | Status: DC | PRN
  Administered 2024-11-23: 10 mg via INTRAVENOUS
  Administered 2024-11-23: 5 mg via INTRAVENOUS

## 2024-11-23 MED ORDER — EPHEDRINE 5 MG/ML INJ
INTRAVENOUS | Status: AC
  Filled 2024-11-23: qty 5

## 2024-11-23 MED ORDER — LIDOCAINE HCL (PF) 2 % IJ SOLN
INTRAMUSCULAR | Status: AC
  Filled 2024-11-23: qty 5

## 2024-11-23 MED ORDER — LIDOCAINE HCL (PF) 2 % IJ SOLN
INTRAMUSCULAR | Status: DC | PRN
  Administered 2024-11-23: 100 mg via INTRADERMAL

## 2024-11-23 MED ORDER — PROPOFOL 10 MG/ML IV BOLUS
INTRAVENOUS | Status: AC
  Filled 2024-11-23: qty 40

## 2024-11-23 NOTE — Anesthesia Preprocedure Evaluation (Addendum)
"                                    Anesthesia Evaluation  Patient identified by MRN, date of birth, ID band Patient awake    Reviewed: Allergy & Precautions, H&P , NPO status , Patient's Chart, lab work & pertinent test results  Airway Mallampati: II  TM Distance: >3 FB Neck ROM: Full    Dental no notable dental hx. (+) Poor Dentition   Pulmonary former smoker   Pulmonary exam normal breath sounds clear to auscultation       Cardiovascular hypertension, Normal cardiovascular exam Rhythm:Regular Rate:Normal     Neuro/Psych negative neurological ROS  negative psych ROS   GI/Hepatic negative GI ROS, Neg liver ROS,,,  Endo/Other  negative endocrine ROS    Renal/GU negative Renal ROS  negative genitourinary   Musculoskeletal negative musculoskeletal ROS (+)    Abdominal   Peds negative pediatric ROS (+)  Hematology negative hematology ROS (+)   Anesthesia Other Findings   Reproductive/Obstetrics negative OB ROS                              Anesthesia Physical Anesthesia Plan  ASA: 3  Anesthesia Plan: General   Post-op Pain Management:    Induction: Intravenous  PONV Risk Score and Plan: 1 and Ondansetron   Airway Management Planned:   Additional Equipment:   Intra-op Plan:   Post-operative Plan: Extubation in OR  Informed Consent: I have reviewed the patients History and Physical, chart, labs and discussed the procedure including the risks, benefits and alternatives for the proposed anesthesia with the patient or authorized representative who has indicated his/her understanding and acceptance.     Dental advisory given  Plan Discussed with: CRNA  Anesthesia Plan Comments:          Anesthesia Quick Evaluation  "

## 2024-11-23 NOTE — Anesthesia Postprocedure Evaluation (Signed)
"   Anesthesia Post Note  Patient: [name]  Procedure(s) Performed: COLONOSCOPY POLYPECTOMY, INTESTINE CONTROL OF HEMORRHAGE, GI TRACT, ENDOSCOPIC, BY CLIPPING OR OVERSEWING  Patient location during evaluation: Endoscopy Anesthesia Type: General Level of consciousness: awake and alert Pain management: pain level controlled Vital Signs Assessment: post-procedure vital signs reviewed and stable Respiratory status: spontaneous breathing, nonlabored ventilation, respiratory function stable and patient connected to nasal cannula oxygen Cardiovascular status: blood pressure returned to baseline and stable Postop Assessment: no apparent nausea or vomiting Anesthetic complications: no   No notable events documented.   Last Vitals:  Vitals:   11/23/24 0921 11/23/24 0924  BP: (!) 89/49 (!) 95/51  Pulse: 78 79  Resp: 16 18  Temp: (!) 36.1 C   SpO2: 99% 98%    Last Pain:  Vitals:   11/23/24 0921  TempSrc: Temporal  PainSc: 0-No pain                 Fairy A Michiel Sivley      "

## 2024-11-23 NOTE — H&P (Signed)
 "                                                                                                                           Ruel Kung , MD [address], [place], [place], [address] Phone: [phone] Fax: [phone]  Primary Care Physician:  Rudolpho Norleen BIRCH, MD   Pre-Procedure History & Physical: HPI:  [name] is a 69 y.o. female is here for an colonoscopy.   Past Medical History:  Diagnosis Date   Anxiety    Arthritis    CAD (coronary artery disease)    a. 03/2013 NSTEMI/Cath: 100 RCA, EF 45%->Med Rx;  b. 12/2013 Cath/PCI: LAD 40m, 30d, D1 80, D2 60, LCX nl, RCA 50ost/p, 54m, 95d (2.5x33 Xience DES);  c. 05/2014 Lexi MV: EF 68%, no ischemia; d. stress echo 12/2015 no ischemia @ max exercise (did not achieve target)   Carotid disease, bilateral    a. 08/2013 Carotid U/S: 1-39% bilat ICA stenosis.   Chronic kidney disease    Chronic kidney infections. Takes daily preventative.   Complication of anesthesia    CVA (cerebral vascular accident) (HCC)    a. 08/2013.   CVA (cerebral vascular accident) (HCC)    Decreased libido    Depression    GERD (gastroesophageal reflux disease)    History of 2019 novel coronavirus disease (COVID-19) 07/2019   History of recurrent UTIs    Hypercholesteremia    Hyperlipemia    Hypertension    Insomnia    Ischemic cardiomyopathy    a. 03/2013 Echo: EF 30-35%, mod dil LA, mod-sev MR, mod TR;  b. 08/2013 Echo EF 60-65%, mild AI.   Menopause    Migraines    Myocardial infarction (HCC)    PONV (postoperative nausea and vomiting)    If anesthsia is given slowly, pt will not throw up, if given quickly, pt will have nausea and vomiting.   Right lower quadrant pain    Syncope and collapse    Tobacco abuse    Vaginal atrophy     Past Surgical History:  Procedure Laterality Date   CARDIAC CATHETERIZATION  01/01/2014   CARDIAC CATHETERIZATION  03/16/2013   CARDIAC CATHETERIZATION  12/2013   CARDIAC CATHETERIZATION N/A 08/19/2016    Procedure: Left Heart Cath and Coronary Angiography;  Surgeon: Evalene JINNY Lunger, MD;  Location: [facility] INVASIVE CV LAB;  Service: Cardiovascular;  Laterality: N/A;   CESAREAN SECTION WITH BILATERAL TUBAL LIGATION     CORONARY ANGIOPLASTY WITH STENT PLACEMENT  01/01/2014   95% lesion with a drug eluting stent to the distal RCA.   ENDOMETRIAL ABLATION     KNEE ARTHROSCOPY  11/27/2006   left knee    OOPHORECTOMY     TEE WITHOUT CARDIOVERSION N/A 07/25/2021   Procedure: TRANSESOPHAGEAL ECHOCARDIOGRAM (TEE);  Surgeon: Lunger Evalene JINNY, MD;  Location: [facility] ORS;  Service: Cardiovascular;  Laterality: N/A;   TOTAL KNEE ARTHROPLASTY Left 10/06/2016   Procedure: TOTAL  KNEE ARTHROPLASTY;  Surgeon: Norleen JINNY Maltos, MD;  Location: [facility] ORS;  Service: Orthopedics;  Laterality: Left;   TOTAL KNEE ARTHROPLASTY Right 11/12/2020   Procedure: TOTAL KNEE ARTHROPLASTY;  Surgeon: Maltos Norleen JINNY, MD;  Location: [facility] ORS;  Service: Orthopedics;  Laterality: Right;   TUBAL LIGATION      Prior to Admission medications  Medication Sig Start Date End Date Taking? Authorizing Provider  amLODipine  (NORVASC ) 10 MG tablet Take 1 tablet (10 mg total) by mouth daily. 08/29/21  Yes Dunn, Bernardino HERO, PA-C  aspirin  EC 81 MG EC tablet Take 1 tablet (81 mg total) by mouth daily. Swallow whole. 07/03/21  Yes Laurita Pillion, MD  lisinopril  (ZESTRIL ) 5 MG tablet Take 1 tablet (5 mg total) by mouth daily. 06/27/22  Yes Raenelle Coria, MD  pantoprazole  (PROTONIX ) 40 MG tablet Take 40 mg by mouth 2 (two) times daily. 08/21/21  Yes [provider]  rosuvastatin  (CRESTOR ) 40 MG tablet Take 1 tablet (40 mg total) by mouth daily. 10/27/21  Yes Dunn, Bernardino HERO, PA-C  FLUoxetine  (PROZAC ) 20 MG capsule Take 20 mg by mouth daily. Patient not taking: Reported on 08/03/2024 03/05/22   [provider]  furosemide  (LASIX ) 20 MG tablet TAKE 1 TABLET(20 MG) BY MOUTH DAILY Patient not taking: Reported on 08/03/2024 05/26/22   Abigail Bernardino HERO, PA-C   meclizine  (ANTIVERT ) 25 MG tablet Take 1 tablet (25 mg total) by mouth 3 (three) times daily as needed for dizziness. 08/31/22   Siadecki, Sebastian, MD  nitroGLYCERIN  (NITROSTAT ) 0.4 MG SL tablet Place 1 tablet (0.4 mg total) under the tongue every 5 (five) minutes as needed for chest pain (If you experience chest pain: Place 1 nitroglycerin  under your tongue, while sitting. If no relief of pain, may repeat 1 tablet every 5 minutes up to 3 tablets over 15 minutes. If no relief call 911. If you have dizziness/lightheadedness while taking nitroglycerin , stop taking and call 911.). Patient not taking: Reported on 08/03/2024 03/11/22   Abigail Bernardino HERO, PA-C    Allergies as of 10/03/2024 - Review Complete 08/03/2024  Allergen Reaction Noted   Codeine sulfate Other (See Comments) 01/05/2014   Penicillins Nausea And Vomiting 08/10/2013    Family History  Problem Relation Age of Onset   Hypertension Mother    Stroke Mother    Hyperlipidemia Mother    Hypertension Sister    Hypertension Sister    Breast cancer Paternal Grandmother    Colon cancer Neg Hx    Ovarian cancer Neg Hx    Heart disease Neg Hx    Diabetes Neg Hx     Social History   Socioeconomic History   Marital status: Single    Spouse name: Not on file   Number of children: 1   Years of education: Not on file   Highest education level: Not on file  Occupational History   Occupation: Building Control Surveyor: [employer]  Tobacco Use   Smoking status: Former    Current packs/day: 0.00    Average packs/day: 1.5 packs/day for 20.0 years (30.0 ttl pk-yrs)    Types: Cigarettes    Start date: 04/07/1993    Quit date: 04/07/2013    Years since quitting: 11.6   Smokeless tobacco: Never  Vaping Use   Vaping status: Never Used  Substance and Sexual Activity   Alcohol  use: Not Currently    Comment: 1 glass of wine a month   Drug use: No   Sexual activity:  Not Currently    Birth control/protection: None  Other Topics  Concern   Not on file  Social History Narrative   Lives alone, not compliant w/ med regimen   Social Drivers of Health   Tobacco Use: Medium Risk (11/23/2024)   Patient History    Smoking Tobacco Use: Former    Smokeless Tobacco Use: Never    Passive Exposure: Not on file  Financial Resource Strain: Low Risk  (10/02/2024)   Received from [facility]   Overall Financial Resource Strain (CARDIA)    Difficulty of Paying Living Expenses: Not hard at all  Food Insecurity: No Food Insecurity (10/02/2024)   Received from [facility]   Epic    Within the past 12 months, you worried that your food would run out before you got the money to buy more.: Never true    Within the past 12 months, the food you bought just didn't last and you didn't have money to get more.: Never true  Transportation Needs: No Transportation Needs (10/02/2024)   Received from [facility]    In the past 12 months, has lack of transportation kept you from medical appointments or from getting medications?: No    Lack of Transportation (Non-Medical): No  Physical Activity: Not on file  Stress: Not on file  Social Connections: Not on file  Intimate Partner Violence: Not on file  Depression (EYV7-0): Not on file  Alcohol  Screen: Not on file  Housing: Low Risk  (10/02/2024)   Received from [facility]   Epic    In the last 12 months, was there a time when you were not able to pay the mortgage or rent on time?: No    In the past 12 months, how many times have you moved where you were living?: 0    At any time in the past 12 months, were you homeless or living in a shelter (including now)?: No  Utilities: Not At Risk (10/02/2024)   Received from [facility]   Epic    In the past 12 months has the electric, gas, oil, or water company threatened to shut off services in your home?: No  Health Literacy: Not  on file    Review of Systems: See HPI, otherwise negative ROS  Physical Exam: There were no vitals taken for this visit. General:   Alert,  pleasant and cooperative in NAD Head:  Normocephalic and atraumatic. Neck:  Supple; no masses or thyromegaly. Lungs:  Clear throughout to auscultation, normal respiratory effort.    Heart:  +S1, +S2, Regular rate and rhythm, No edema. Abdomen:  Soft, nontender and nondistended. Normal bowel sounds, without guarding, and without rebound.   Neurologic:  Alert and  oriented x4;  grossly normal neurologically.  Impression/Plan: ADRYAN DRUCKENMILLER is here for an colonoscopy to be performed for Screening colonoscopy ,positive cologuard Risks, benefits, limitations, and alternatives regarding  colonoscopy have been reviewed with the patient.  Questions have been answered.  All parties agreeable.   Ruel Kung, MD  11/23/2024, 8:11 AM  "

## 2024-11-23 NOTE — Transfer of Care (Signed)
 Immediate Anesthesia Transfer of Care Note  Patient: [name]  Procedure(s) Performed: COLONOSCOPY POLYPECTOMY, INTESTINE CONTROL OF HEMORRHAGE, GI TRACT, ENDOSCOPIC, BY CLIPPING OR OVERSEWING  Patient Location: Endoscopy Unit  Anesthesia Type:General  Level of Consciousness: drowsy  Airway & Oxygen Therapy: Patient Spontanous Breathing  Post-op Assessment: Report given to RN and Post -op Vital signs reviewed and stable  Post vital signs: Reviewed and stable  Last Vitals:  Vitals Value Taken Time  BP 92/56 11/23/24 09:25  Temp 36.1 C 11/23/24 09:21  Pulse 79 11/23/24 09:24  Resp 18 11/23/24 09:24  SpO2 98 % 11/23/24 09:24  Vitals shown include unfiled device data.  Last Pain:  Vitals:   11/23/24 0921  TempSrc: Temporal  PainSc: 0-No pain         Complications: No notable events documented.

## 2024-11-23 NOTE — Op Note (Signed)
 [facility] Patient Name: [name] Procedure Date: 11/23/2024 8:43 AM MRN: [id number] Account #: [id number] Date of Birth: [birth year] Admit Type: Outpatient Age: 69 Room: [facility] ENDO ROOM 2 Gender: Female Note Status: Finalized Instrument Name: Colon Scope [id number] Procedure:             Colonoscopy Indications:           Screening for colorectal malignant neoplasm due to                         positive Cologuard test Providers:             Ruel Kung MD, MD Referring MD:          Norleen CHARM Rower, MD (Referring MD) Medicines:             Monitored Anesthesia Care Complications:         No immediate complications. Procedure:             Pre-Anesthesia Assessment:                        - Prior to the procedure, a History and Physical was                         performed, and patient medications, allergies and                         sensitivities were reviewed. The patient's tolerance                         of previous anesthesia was reviewed.                        - The risks and benefits of the procedure and the                         sedation options and risks were discussed with the                         patient. All questions were answered and informed                         consent was obtained.                        - ASA Grade Assessment: II - A patient with mild                         systemic disease.                        After obtaining informed consent, the colonoscope was                         passed under direct vision. Throughout the procedure,                         the patient's blood pressure, pulse, and oxygen                         saturations  were monitored continuously. The                         Colonoscope was introduced through the anus and                         advanced to the the cecum, identified by the                         appendiceal orifice. The colonoscopy was performed                          with ease. The patient tolerated the procedure well.                         The quality of the bowel preparation was adequate. The                         ileocecal valve, appendiceal orifice, and rectum were                         photographed. Findings:      The perianal and digital rectal examinations were normal.      A 3 mm polyp was found in the proximal ascending colon. The polyp was       sessile. The polyp was removed with a jumbo cold forceps. Resection and       retrieval were complete.      A 20 mm polyp was found in the sigmoid colon. The polyp was       pedunculated. The polyp was removed with a hot snare. Resection and       retrieval were complete. To prevent bleeding post-intervention, one       hemostatic clip was successfully placed. Clip manufacturer: [employer]. There was no bleeding at the end of the procedure.      An 8 mm polyp was found in the sigmoid colon. The polyp was sessile. The       polyp was removed with a cold snare. Resection and retrieval were       complete. To prevent bleeding post-intervention, one hemostatic clip was       successfully placed. Clip manufacturer: [employer]. There was no       bleeding at the end of the procedure.      The exam was otherwise without abnormality on direct and retroflexion       views.      Multiple small-mouthed diverticula were found in the sigmoid colon. Impression:            - One 3 mm polyp in the proximal ascending colon,                         removed with a jumbo cold forceps. Resected and                         retrieved.                        - One 20 mm polyp in the sigmoid colon, removed with a  hot snare. Resected and retrieved. Clip was placed.                         Clip manufacturer: [employer].                        - One 8 mm polyp in the sigmoid colon, removed with a                         cold snare. Resected and retrieved. Clip was  placed.                         Clip manufacturer: [employer].                        - The examination was otherwise normal on direct and                         retroflexion views. Recommendation:        - Discharge patient to home (with escort).                        - Resume previous diet.                        - Continue present medications.                        - Await pathology results.                        - Repeat colonoscopy in 3 years for surveillance. Procedure Code(s):     --- Professional ---                        [id number], Colonoscopy, flexible; with removal of                         tumor(s), polyp(s), or other lesion(s) by snare                         technique                        45380, 59, Colonoscopy, flexible; with biopsy, single                         or multiple Diagnosis Code(s):     --- Professional ---                        Z12.11, Encounter for screening for malignant neoplasm                         of colon                        R19.5, Other fecal abnormalities                        D12.2, Benign neoplasm of ascending colon  D12.5, Benign neoplasm of sigmoid colon CPT copyright 2022 American Medical Association. All rights reserved. The codes documented in this report are preliminary and upon coder review may  be revised to meet current compliance requirements. Ruel Kung, MD Ruel Kung MD, MD 11/23/2024 9:19:16 AM This report has been signed electronically. Number of Addenda: 0 Note Initiated On: 11/23/2024 8:43 AM Scope Withdrawal Time: 0 hours 14 minutes 19 seconds  Total Procedure Duration: 0 hours 16 minutes 32 seconds  Estimated Blood Loss:  Estimated blood loss: none.      [facility]

## 2024-11-24 LAB — SURGICAL PATHOLOGY

## 2024-11-27 ENCOUNTER — Ambulatory Visit: Payer: Self-pay | Admitting: Gastroenterology

## 2024-12-05 ENCOUNTER — Ambulatory Visit: Attending: Cardiology

## 2024-12-05 DIAGNOSIS — I639 Cerebral infarction, unspecified: Secondary | ICD-10-CM | POA: Diagnosis not present

## 2024-12-05 LAB — CUP PACEART REMOTE DEVICE CHECK
Date Time Interrogation Session: 20260126231742
Implantable Pulse Generator Implant Date: 20221019

## 2024-12-10 ENCOUNTER — Ambulatory Visit: Payer: Self-pay | Admitting: Cardiology

## 2024-12-13 NOTE — Progress Notes (Signed)
 Remote Loop Recorder Transmission

## 2025-01-05 ENCOUNTER — Ambulatory Visit

## 2025-02-01 ENCOUNTER — Ambulatory Visit: Admitting: Physician Assistant

## 2025-02-05 ENCOUNTER — Ambulatory Visit

## 2025-03-08 ENCOUNTER — Ambulatory Visit

## 2025-04-08 ENCOUNTER — Ambulatory Visit

## 2025-05-09 ENCOUNTER — Ambulatory Visit

## 2025-06-09 ENCOUNTER — Ambulatory Visit

## 2025-07-10 ENCOUNTER — Ambulatory Visit

## 2025-08-10 ENCOUNTER — Ambulatory Visit

## 2025-09-10 ENCOUNTER — Ambulatory Visit

## 2025-10-11 ENCOUNTER — Ambulatory Visit

## 2025-11-11 ENCOUNTER — Ambulatory Visit
